# Patient Record
Sex: Female | Born: 1940 | Race: White | Hispanic: No | Smoking: Former smoker
Health system: Southern US, Community
[De-identification: ages and names within clinical notes are randomized; demographics above are authoritative.]

## PROBLEM LIST (undated history)

## (undated) DIAGNOSIS — C801 Malignant (primary) neoplasm, unspecified: Secondary | ICD-10-CM

## (undated) DIAGNOSIS — Z720 Tobacco use: Secondary | ICD-10-CM

## (undated) DIAGNOSIS — I739 Peripheral vascular disease, unspecified: Secondary | ICD-10-CM

## (undated) DIAGNOSIS — I5189 Other ill-defined heart diseases: Secondary | ICD-10-CM

## (undated) DIAGNOSIS — Z85828 Personal history of other malignant neoplasm of skin: Secondary | ICD-10-CM

## (undated) DIAGNOSIS — J449 Chronic obstructive pulmonary disease, unspecified: Secondary | ICD-10-CM

## (undated) DIAGNOSIS — E785 Hyperlipidemia, unspecified: Secondary | ICD-10-CM

## (undated) DIAGNOSIS — F419 Anxiety disorder, unspecified: Secondary | ICD-10-CM

## (undated) DIAGNOSIS — I2510 Atherosclerotic heart disease of native coronary artery without angina pectoris: Secondary | ICD-10-CM

## (undated) DIAGNOSIS — I10 Essential (primary) hypertension: Secondary | ICD-10-CM

## (undated) DIAGNOSIS — I714 Abdominal aortic aneurysm, without rupture, unspecified: Secondary | ICD-10-CM

## (undated) DIAGNOSIS — Z8589 Personal history of malignant neoplasm of other organs and systems: Secondary | ICD-10-CM

## (undated) HISTORY — DX: Other ill-defined heart diseases: I51.89

## (undated) HISTORY — PX: BREAST EXCISIONAL BIOPSY: SUR124

## (undated) HISTORY — DX: Hyperlipidemia, unspecified: E78.5

## (undated) HISTORY — DX: Tobacco use: Z72.0

## (undated) HISTORY — DX: Peripheral vascular disease, unspecified: I73.9

## (undated) HISTORY — DX: Personal history of other malignant neoplasm of skin: Z85.828

## (undated) HISTORY — PX: APPENDECTOMY: SHX54

## (undated) HISTORY — DX: Atherosclerotic heart disease of native coronary artery without angina pectoris: I25.10

## (undated) HISTORY — PX: CARDIAC CATHETERIZATION: SHX172

## (undated) HISTORY — PX: ABDOMINAL HYSTERECTOMY: SHX81

---
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
## 2011-02-22 ENCOUNTER — Ambulatory Visit: Payer: Self-pay | Admitting: Family Medicine

## 2012-02-27 ENCOUNTER — Ambulatory Visit: Payer: Self-pay | Admitting: Family Medicine

## 2013-09-18 ENCOUNTER — Ambulatory Visit: Payer: Self-pay | Admitting: Family Medicine

## 2014-04-28 ENCOUNTER — Emergency Department: Payer: Self-pay | Admitting: Emergency Medicine

## 2014-04-28 LAB — CBC
HCT: 42.5 % (ref 35.0–47.0)
HGB: 14 g/dL (ref 12.0–16.0)
MCH: 31.6 pg (ref 26.0–34.0)
MCHC: 32.8 g/dL (ref 32.0–36.0)
MCV: 96 fL (ref 80–100)
PLATELETS: 245 10*3/uL (ref 150–440)
RBC: 4.42 10*6/uL (ref 3.80–5.20)
RDW: 12.8 % (ref 11.5–14.5)
WBC: 6.9 10*3/uL (ref 3.6–11.0)

## 2014-04-28 LAB — COMPREHENSIVE METABOLIC PANEL
ALBUMIN: 4.1 g/dL (ref 3.4–5.0)
ALK PHOS: 101 U/L
ANION GAP: 9 (ref 7–16)
AST: 20 U/L (ref 15–37)
BUN: 12 mg/dL (ref 7–18)
Bilirubin,Total: 0.3 mg/dL (ref 0.2–1.0)
CALCIUM: 9.2 mg/dL (ref 8.5–10.1)
Chloride: 100 mmol/L (ref 98–107)
Co2: 30 mmol/L (ref 21–32)
Creatinine: 0.64 mg/dL (ref 0.60–1.30)
EGFR (African American): >60
Glucose: 94 mg/dL (ref 65–99)
OSMOLALITY: 277 (ref 275–301)
POTASSIUM: 3.5 mmol/L (ref 3.5–5.1)
SGPT (ALT): 25 U/L
Sodium: 139 mmol/L (ref 136–145)
Total Protein: 7.9 g/dL (ref 6.4–8.2)

## 2014-04-28 LAB — URINALYSIS, COMPLETE
BACTERIA: NONE SEEN
BILIRUBIN, UR: NEGATIVE
Glucose,UR: NEGATIVE mg/dL (ref 0–75)
Ketone: NEGATIVE
LEUKOCYTE ESTERASE: NEGATIVE
Nitrite: NEGATIVE
Ph: 7 (ref 4.5–8.0)
Protein: NEGATIVE
SQUAMOUS EPITHELIAL: NONE SEEN
Specific Gravity: 1.002 (ref 1.003–1.030)
WBC UR: NONE SEEN /HPF (ref 0–5)

## 2014-07-21 DIAGNOSIS — I714 Abdominal aortic aneurysm, without rupture: Secondary | ICD-10-CM | POA: Diagnosis not present

## 2014-07-21 DIAGNOSIS — I70213 Atherosclerosis of native arteries of extremities with intermittent claudication, bilateral legs: Secondary | ICD-10-CM | POA: Diagnosis not present

## 2014-07-21 DIAGNOSIS — I70211 Atherosclerosis of native arteries of extremities with intermittent claudication, right leg: Secondary | ICD-10-CM | POA: Diagnosis not present

## 2014-07-21 DIAGNOSIS — I77819 Aortic ectasia, unspecified site: Secondary | ICD-10-CM | POA: Diagnosis not present

## 2014-07-21 DIAGNOSIS — F17200 Nicotine dependence, unspecified, uncomplicated: Secondary | ICD-10-CM | POA: Diagnosis not present

## 2014-10-19 DIAGNOSIS — J449 Chronic obstructive pulmonary disease, unspecified: Secondary | ICD-10-CM | POA: Diagnosis not present

## 2014-10-19 DIAGNOSIS — J209 Acute bronchitis, unspecified: Secondary | ICD-10-CM | POA: Diagnosis not present

## 2014-10-19 DIAGNOSIS — F419 Anxiety disorder, unspecified: Secondary | ICD-10-CM | POA: Diagnosis not present

## 2014-10-19 DIAGNOSIS — E780 Pure hypercholesterolemia: Secondary | ICD-10-CM | POA: Diagnosis not present

## 2014-11-05 ENCOUNTER — Ambulatory Visit
Admit: 2014-11-05 | Disposition: A | Discharge status: Home / Self Care | Payer: Self-pay | Attending: Family Medicine | Admitting: Family Medicine

## 2014-11-05 DIAGNOSIS — Z1231 Encounter for screening mammogram for malignant neoplasm of breast: Secondary | ICD-10-CM | POA: Diagnosis not present

## 2014-11-13 DIAGNOSIS — I700 Atherosclerosis of aorta: Secondary | ICD-10-CM | POA: Diagnosis not present

## 2014-11-13 DIAGNOSIS — I70219 Atherosclerosis of native arteries of extremities with intermittent claudication, unspecified extremity: Secondary | ICD-10-CM | POA: Diagnosis not present

## 2014-11-13 DIAGNOSIS — I70213 Atherosclerosis of native arteries of extremities with intermittent claudication, bilateral legs: Secondary | ICD-10-CM | POA: Diagnosis not present

## 2014-11-13 DIAGNOSIS — I739 Peripheral vascular disease, unspecified: Secondary | ICD-10-CM | POA: Diagnosis not present

## 2014-11-13 DIAGNOSIS — I70211 Atherosclerosis of native arteries of extremities with intermittent claudication, right leg: Secondary | ICD-10-CM | POA: Diagnosis not present

## 2014-11-13 DIAGNOSIS — I77819 Aortic ectasia, unspecified site: Secondary | ICD-10-CM | POA: Diagnosis not present

## 2014-11-13 DIAGNOSIS — I714 Abdominal aortic aneurysm, without rupture: Secondary | ICD-10-CM | POA: Diagnosis not present

## 2014-11-13 DIAGNOSIS — F17200 Nicotine dependence, unspecified, uncomplicated: Secondary | ICD-10-CM | POA: Diagnosis not present

## 2014-12-30 DIAGNOSIS — M5136 Other intervertebral disc degeneration, lumbar region: Secondary | ICD-10-CM | POA: Diagnosis not present

## 2014-12-30 DIAGNOSIS — J449 Chronic obstructive pulmonary disease, unspecified: Secondary | ICD-10-CM | POA: Diagnosis not present

## 2014-12-30 DIAGNOSIS — M545 Low back pain: Secondary | ICD-10-CM | POA: Diagnosis not present

## 2014-12-30 DIAGNOSIS — F17200 Nicotine dependence, unspecified, uncomplicated: Secondary | ICD-10-CM | POA: Diagnosis not present

## 2014-12-30 DIAGNOSIS — E559 Vitamin D deficiency, unspecified: Secondary | ICD-10-CM | POA: Diagnosis not present

## 2014-12-30 DIAGNOSIS — Z716 Tobacco abuse counseling: Secondary | ICD-10-CM | POA: Diagnosis not present

## 2014-12-30 DIAGNOSIS — R262 Difficulty in walking, not elsewhere classified: Secondary | ICD-10-CM | POA: Diagnosis not present

## 2014-12-30 DIAGNOSIS — R5383 Other fatigue: Secondary | ICD-10-CM | POA: Diagnosis not present

## 2014-12-30 DIAGNOSIS — R64 Cachexia: Secondary | ICD-10-CM | POA: Diagnosis not present

## 2014-12-30 DIAGNOSIS — M25551 Pain in right hip: Secondary | ICD-10-CM | POA: Diagnosis not present

## 2014-12-30 DIAGNOSIS — E780 Pure hypercholesterolemia: Secondary | ICD-10-CM | POA: Diagnosis not present

## 2014-12-31 DIAGNOSIS — M129 Arthropathy, unspecified: Secondary | ICD-10-CM | POA: Diagnosis not present

## 2014-12-31 DIAGNOSIS — F419 Anxiety disorder, unspecified: Secondary | ICD-10-CM | POA: Diagnosis not present

## 2014-12-31 DIAGNOSIS — M25551 Pain in right hip: Secondary | ICD-10-CM | POA: Diagnosis not present

## 2014-12-31 DIAGNOSIS — J449 Chronic obstructive pulmonary disease, unspecified: Secondary | ICD-10-CM | POA: Diagnosis not present

## 2015-03-18 DIAGNOSIS — R05 Cough: Secondary | ICD-10-CM | POA: Diagnosis not present

## 2015-03-18 DIAGNOSIS — R509 Fever, unspecified: Secondary | ICD-10-CM | POA: Diagnosis not present

## 2015-03-18 DIAGNOSIS — R062 Wheezing: Secondary | ICD-10-CM | POA: Diagnosis not present

## 2015-03-18 DIAGNOSIS — J209 Acute bronchitis, unspecified: Secondary | ICD-10-CM | POA: Diagnosis not present

## 2015-05-18 DIAGNOSIS — I700 Atherosclerosis of aorta: Secondary | ICD-10-CM | POA: Diagnosis not present

## 2015-05-18 DIAGNOSIS — I70211 Atherosclerosis of native arteries of extremities with intermittent claudication, right leg: Secondary | ICD-10-CM | POA: Diagnosis not present

## 2015-05-18 DIAGNOSIS — I70219 Atherosclerosis of native arteries of extremities with intermittent claudication, unspecified extremity: Secondary | ICD-10-CM | POA: Diagnosis not present

## 2015-05-18 DIAGNOSIS — I714 Abdominal aortic aneurysm, without rupture: Secondary | ICD-10-CM | POA: Diagnosis not present

## 2015-05-18 DIAGNOSIS — I77819 Aortic ectasia, unspecified site: Secondary | ICD-10-CM | POA: Diagnosis not present

## 2015-05-18 DIAGNOSIS — F17200 Nicotine dependence, unspecified, uncomplicated: Secondary | ICD-10-CM | POA: Diagnosis not present

## 2015-05-18 DIAGNOSIS — I70213 Atherosclerosis of native arteries of extremities with intermittent claudication, bilateral legs: Secondary | ICD-10-CM | POA: Diagnosis not present

## 2015-05-18 DIAGNOSIS — I739 Peripheral vascular disease, unspecified: Secondary | ICD-10-CM | POA: Diagnosis not present

## 2015-06-12 DIAGNOSIS — Z23 Encounter for immunization: Secondary | ICD-10-CM | POA: Diagnosis not present

## 2015-08-18 DIAGNOSIS — R636 Underweight: Secondary | ICD-10-CM | POA: Diagnosis not present

## 2015-08-18 DIAGNOSIS — Z0001 Encounter for general adult medical examination with abnormal findings: Secondary | ICD-10-CM | POA: Diagnosis not present

## 2015-08-18 DIAGNOSIS — R7301 Impaired fasting glucose: Secondary | ICD-10-CM | POA: Diagnosis not present

## 2015-08-18 DIAGNOSIS — R05 Cough: Secondary | ICD-10-CM | POA: Diagnosis not present

## 2015-08-18 DIAGNOSIS — J069 Acute upper respiratory infection, unspecified: Secondary | ICD-10-CM | POA: Diagnosis not present

## 2015-08-18 DIAGNOSIS — R062 Wheezing: Secondary | ICD-10-CM | POA: Diagnosis not present

## 2015-08-18 DIAGNOSIS — E559 Vitamin D deficiency, unspecified: Secondary | ICD-10-CM | POA: Diagnosis not present

## 2015-08-18 DIAGNOSIS — F17210 Nicotine dependence, cigarettes, uncomplicated: Secondary | ICD-10-CM | POA: Diagnosis not present

## 2015-08-18 DIAGNOSIS — E782 Mixed hyperlipidemia: Secondary | ICD-10-CM | POA: Diagnosis not present

## 2015-09-06 DIAGNOSIS — E559 Vitamin D deficiency, unspecified: Secondary | ICD-10-CM | POA: Diagnosis not present

## 2015-09-06 DIAGNOSIS — R05 Cough: Secondary | ICD-10-CM | POA: Diagnosis not present

## 2015-09-06 DIAGNOSIS — F17210 Nicotine dependence, cigarettes, uncomplicated: Secondary | ICD-10-CM | POA: Diagnosis not present

## 2015-09-06 DIAGNOSIS — J393 Upper respiratory tract hypersensitivity reaction, site unspecified: Secondary | ICD-10-CM | POA: Diagnosis not present

## 2015-09-06 DIAGNOSIS — J309 Allergic rhinitis, unspecified: Secondary | ICD-10-CM | POA: Diagnosis not present

## 2015-11-25 ENCOUNTER — Observation Stay
Admission: EM | Admit: 2015-11-25 | Discharge: 2015-11-26 | Disposition: A | Discharge status: Home / Self Care | Payer: No Typology Code available for payment source | Attending: Internal Medicine | Admitting: Internal Medicine

## 2015-11-25 ENCOUNTER — Emergency Department: Payer: No Typology Code available for payment source

## 2015-11-25 ENCOUNTER — Encounter: Payer: Self-pay | Admitting: Emergency Medicine

## 2015-11-25 DIAGNOSIS — R7989 Other specified abnormal findings of blood chemistry: Secondary | ICD-10-CM

## 2015-11-25 DIAGNOSIS — R079 Chest pain, unspecified: Principal | ICD-10-CM | POA: Diagnosis present

## 2015-11-25 DIAGNOSIS — S1980XA Other specified injuries of unspecified part of neck, initial encounter: Secondary | ICD-10-CM | POA: Diagnosis not present

## 2015-11-25 DIAGNOSIS — F17200 Nicotine dependence, unspecified, uncomplicated: Secondary | ICD-10-CM | POA: Diagnosis not present

## 2015-11-25 DIAGNOSIS — M79632 Pain in left forearm: Secondary | ICD-10-CM | POA: Insufficient documentation

## 2015-11-25 DIAGNOSIS — S299XXA Unspecified injury of thorax, initial encounter: Secondary | ICD-10-CM | POA: Diagnosis not present

## 2015-11-25 DIAGNOSIS — M542 Cervicalgia: Secondary | ICD-10-CM | POA: Diagnosis not present

## 2015-11-25 DIAGNOSIS — R0789 Other chest pain: Secondary | ICD-10-CM | POA: Diagnosis not present

## 2015-11-25 DIAGNOSIS — Y92410 Unspecified street and highway as the place of occurrence of the external cause: Secondary | ICD-10-CM | POA: Insufficient documentation

## 2015-11-25 DIAGNOSIS — M25532 Pain in left wrist: Secondary | ICD-10-CM | POA: Insufficient documentation

## 2015-11-25 DIAGNOSIS — R748 Abnormal levels of other serum enzymes: Secondary | ICD-10-CM | POA: Insufficient documentation

## 2015-11-25 DIAGNOSIS — S59912A Unspecified injury of left forearm, initial encounter: Secondary | ICD-10-CM | POA: Diagnosis not present

## 2015-11-25 DIAGNOSIS — Z720 Tobacco use: Secondary | ICD-10-CM | POA: Diagnosis not present

## 2015-11-25 DIAGNOSIS — Z716 Tobacco abuse counseling: Secondary | ICD-10-CM | POA: Diagnosis not present

## 2015-11-25 DIAGNOSIS — M79602 Pain in left arm: Secondary | ICD-10-CM | POA: Diagnosis not present

## 2015-11-25 DIAGNOSIS — R778 Other specified abnormalities of plasma proteins: Secondary | ICD-10-CM

## 2015-11-25 DIAGNOSIS — I714 Abdominal aortic aneurysm, without rupture: Secondary | ICD-10-CM | POA: Diagnosis not present

## 2015-11-25 DIAGNOSIS — S20219A Contusion of unspecified front wall of thorax, initial encounter: Secondary | ICD-10-CM | POA: Diagnosis not present

## 2015-11-25 HISTORY — DX: Abdominal aortic aneurysm, without rupture: I71.4

## 2015-11-25 HISTORY — DX: Abdominal aortic aneurysm, without rupture, unspecified: I71.40

## 2015-11-25 LAB — CBC WITH DIFFERENTIAL/PLATELET
Basophils Absolute: 0 10*3/uL (ref 0–0.1)
EOS ABS: 0 10*3/uL (ref 0–0.7)
Eosinophils Relative: 0 %
HCT: 43.5 % (ref 35.0–47.0)
Hemoglobin: 14.7 g/dL (ref 12.0–16.0)
LYMPHS ABS: 1.2 10*3/uL (ref 1.0–3.6)
Lymphocytes Relative: 17 %
MCH: 30.5 pg (ref 26.0–34.0)
MCHC: 33.8 g/dL (ref 32.0–36.0)
MCV: 90.2 fL (ref 80.0–100.0)
Monocytes Absolute: 0.5 10*3/uL (ref 0.2–0.9)
Neutro Abs: 5.3 10*3/uL (ref 1.4–6.5)
Neutrophils Relative %: 74 %
PLATELETS: 249 10*3/uL (ref 150–440)
RBC: 4.82 MIL/uL (ref 3.80–5.20)
RDW: 14.1 % (ref 11.5–14.5)
WBC: 7.1 10*3/uL (ref 3.6–11.0)

## 2015-11-25 LAB — BASIC METABOLIC PANEL
Anion gap: 9 (ref 5–15)
BUN: 15 mg/dL (ref 6–20)
CALCIUM: 9.9 mg/dL (ref 8.9–10.3)
CO2: 31 mmol/L (ref 22–32)
CREATININE: 0.67 mg/dL (ref 0.44–1.00)
Chloride: 97 mmol/L — ABNORMAL LOW (ref 101–111)
GFR calc non Af Amer: >60 mL/min (ref >60)
Glucose, Bld: 107 mg/dL — ABNORMAL HIGH (ref 65–99)
Potassium: 4.5 mmol/L (ref 3.5–5.1)
SODIUM: 137 mmol/L (ref 135–145)

## 2015-11-25 LAB — TROPONIN I
TROPONIN I: 0.12 ng/mL — AB (ref <0.031)
Troponin I: 0.07 ng/mL — ABNORMAL HIGH (ref <0.031)
Troponin I: 0.09 ng/mL — ABNORMAL HIGH (ref <0.031)

## 2015-11-25 MED ORDER — ASPIRIN 81 MG PO CHEW
324.0000 mg | CHEWABLE_TABLET | Freq: Once | ORAL | Status: AC
Start: 2015-11-25 — End: 2015-11-25
  Administered 2015-11-25: 324 mg via ORAL
  Filled 2015-11-25: qty 4

## 2015-11-25 MED ORDER — NITROGLYCERIN 0.4 MG SL SUBL: 0.4000 mg | SUBLINGUAL_TABLET | SUBLINGUAL | Status: DC | PRN

## 2015-11-25 MED ORDER — MORPHINE SULFATE (PF) 2 MG/ML IV SOLN: 2.0000 mg | INTRAVENOUS | Status: DC | PRN

## 2015-11-25 MED ORDER — ATORVASTATIN CALCIUM 20 MG PO TABS: 20.0000 mg | ORAL_TABLET | Freq: Every day | ORAL | Status: DC

## 2015-11-25 MED ORDER — IOPAMIDOL (ISOVUE-300) INJECTION 61%
60.0000 mL | Freq: Once | INTRAVENOUS | Status: AC | PRN
  Administered 2015-11-25: 60 mL via INTRAVENOUS

## 2015-11-25 MED ORDER — ACETAMINOPHEN 650 MG RE SUPP: 650.0000 mg | Freq: Four times a day (QID) | RECTAL | Status: DC | PRN

## 2015-11-25 MED ORDER — ACETAMINOPHEN 325 MG PO TABS
ORAL_TABLET | ORAL | Status: AC
Start: 2015-11-25 — End: 2015-11-25
  Administered 2015-11-25: 650 mg
  Filled 2015-11-25: qty 2

## 2015-11-25 MED ORDER — ONDANSETRON HCL 4 MG PO TABS: 4.0000 mg | ORAL_TABLET | Freq: Four times a day (QID) | ORAL | Status: DC | PRN

## 2015-11-25 MED ORDER — OXYCODONE HCL 5 MG PO TABS: 5.0000 mg | ORAL_TABLET | ORAL | Status: DC | PRN

## 2015-11-25 MED ORDER — ONDANSETRON HCL 4 MG/2ML IJ SOLN: 4.0000 mg | Freq: Four times a day (QID) | INTRAMUSCULAR | Status: DC | PRN

## 2015-11-25 MED ORDER — ENOXAPARIN SODIUM 30 MG/0.3ML ~~LOC~~ SOLN
30.0000 mg | SUBCUTANEOUS | Status: DC
  Administered 2015-11-25: 30 mg via SUBCUTANEOUS
  Filled 2015-11-25: qty 0.3

## 2015-11-25 MED ORDER — ACETAMINOPHEN 325 MG PO TABS
650.0000 mg | ORAL_TABLET | Freq: Four times a day (QID) | ORAL | Status: DC | PRN
  Administered 2015-11-25 – 2015-11-26 (×2): 650 mg via ORAL
  Filled 2015-11-25 (×2): qty 2

## 2015-11-25 MED ORDER — ALBUTEROL SULFATE (2.5 MG/3ML) 0.083% IN NEBU: 3.0000 mL | INHALATION_SOLUTION | Freq: Four times a day (QID) | RESPIRATORY_TRACT | Status: DC | PRN

## 2015-11-25 MED ORDER — OXYCODONE-ACETAMINOPHEN 5-325 MG PO TABS
1.0000 | ORAL_TABLET | Freq: Once | ORAL | Status: AC
  Administered 2015-11-25: 1 via ORAL
  Filled 2015-11-25: qty 1

## 2015-11-25 MED ORDER — ASPIRIN EC 81 MG PO TBEC: 81.0000 mg | DELAYED_RELEASE_TABLET | Freq: Every day | ORAL | Status: DC

## 2015-11-25 NOTE — ED Notes (Signed)
c collar placed on pt
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
## 2015-11-25 NOTE — ED Notes (Signed)
Pt up to bathroom with no difficulty noted.

## 2015-11-25 NOTE — ED Notes (Signed)
Reported elevated troponin to Dr Reita Cliche. , troponin 0.07

## 2015-11-25 NOTE — ED Notes (Signed)
Patient transported to X-ray. Pt cleared from ccollar.
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
## 2015-11-26 ENCOUNTER — Ambulatory Visit: Payer: Medicare Other

## 2015-11-26 DIAGNOSIS — Z716 Tobacco abuse counseling: Secondary | ICD-10-CM | POA: Diagnosis not present

## 2015-11-26 DIAGNOSIS — R079 Chest pain, unspecified: Secondary | ICD-10-CM | POA: Diagnosis not present

## 2015-11-26 DIAGNOSIS — R0789 Other chest pain: Secondary | ICD-10-CM | POA: Diagnosis not present

## 2015-11-26 DIAGNOSIS — I714 Abdominal aortic aneurysm, without rupture: Secondary | ICD-10-CM | POA: Diagnosis not present

## 2015-11-26 DIAGNOSIS — Z720 Tobacco use: Secondary | ICD-10-CM | POA: Diagnosis not present

## 2015-11-26 DIAGNOSIS — R748 Abnormal levels of other serum enzymes: Secondary | ICD-10-CM | POA: Diagnosis not present

## 2015-11-26 LAB — PROTIME-INR
INR: 1.04
Prothrombin Time: 13.8 seconds (ref 11.4–15.0)

## 2015-11-26 LAB — CBC
HEMATOCRIT: 36.4 % (ref 35.0–47.0)
Hemoglobin: 12.6 g/dL (ref 12.0–16.0)
MCH: 31.2 pg (ref 26.0–34.0)
MCHC: 34.6 g/dL (ref 32.0–36.0)
MCV: 90.1 fL (ref 80.0–100.0)
Platelets: 208 10*3/uL (ref 150–440)
RBC: 4.04 MIL/uL (ref 3.80–5.20)
RDW: 13.5 % (ref 11.5–14.5)
WBC: 5 10*3/uL (ref 3.6–11.0)

## 2015-11-26 LAB — LIPID PANEL
CHOL/HDL RATIO: 2.6 ratio
CHOLESTEROL: 219 mg/dL — AB (ref 0–200)
HDL: 85 mg/dL (ref >40)
LDL Cholesterol: 128 mg/dL — ABNORMAL HIGH (ref 0–99)
Triglycerides: 31 mg/dL (ref <150)
VLDL: 6 mg/dL (ref 0–40)

## 2015-11-26 LAB — COMPREHENSIVE METABOLIC PANEL
ALBUMIN: 3.7 g/dL (ref 3.5–5.0)
ALT: 24 U/L (ref 14–54)
AST: 29 U/L (ref 15–41)
Alkaline Phosphatase: 66 U/L (ref 38–126)
Anion gap: 6 (ref 5–15)
BUN: 25 mg/dL — AB (ref 6–20)
CO2: 29 mmol/L (ref 22–32)
Calcium: 9 mg/dL (ref 8.9–10.3)
Chloride: 102 mmol/L (ref 101–111)
Creatinine, Ser: 0.67 mg/dL (ref 0.44–1.00)
GFR calc Af Amer: >60 mL/min (ref >60)
GFR calc non Af Amer: >60 mL/min (ref >60)
GLUCOSE: 100 mg/dL — AB (ref 65–99)
POTASSIUM: 4.1 mmol/L (ref 3.5–5.1)
Sodium: 137 mmol/L (ref 135–145)
Total Bilirubin: 0.5 mg/dL (ref 0.3–1.2)
Total Protein: 6.3 g/dL — ABNORMAL LOW (ref 6.5–8.1)

## 2015-11-26 LAB — TSH: TSH: 3.054 u[IU]/mL (ref 0.350–4.500)

## 2015-11-26 LAB — TROPONIN I: Troponin I: 0.05 ng/mL — ABNORMAL HIGH (ref <0.031)

## 2015-11-26 NOTE — Progress Notes (Signed)
OK for discharge. Full note to follow
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
## 2015-11-26 NOTE — Progress Notes (Signed)
Discharge instructions explained to pt and pts family/ verbalized an understanding/ iv and tele removed/ will transport off unit via wheelchair  

## 2015-11-26 NOTE — Progress Notes (Signed)
A&O. Independent. Medicated for chest pain. On room air. Tele  And skin verified with Yasmin.

## 2015-11-26 NOTE — Progress Notes (Signed)
   11/26/15 1120  Clinical Encounter Type  Visited With Patient and family together  Visit Type Initial  Referral From Nurse  Consult/Referral To Chaplain  Spiritual Encounters  Spiritual Needs Literature  Stress Factors  Patient Stress Factors Health changes  Advance Directives (For Healthcare)  Does patient have an advance directive? No  Would patient like information on creating an advanced directive? Yes - Educational materials given  Does patient want to make changes to advanced directive? No - Patient declined  Provided patient & family w/AD education and forms. Patient plans to review at home and proceed there if desired. Explained how to proceed with notary & witnesses. Chap. Marcelline Temkin G. [place]
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
## 2015-11-30 ENCOUNTER — Emergency Department
Admission: EM | Admit: 2015-11-30 | Discharge: 2015-11-30 | Disposition: A | Discharge status: Home / Self Care | Payer: Medicare Other | Attending: Emergency Medicine | Admitting: Emergency Medicine

## 2015-11-30 DIAGNOSIS — M62838 Other muscle spasm: Secondary | ICD-10-CM | POA: Insufficient documentation

## 2015-11-30 DIAGNOSIS — S20219A Contusion of unspecified front wall of thorax, initial encounter: Secondary | ICD-10-CM

## 2015-11-30 DIAGNOSIS — Y92410 Unspecified street and highway as the place of occurrence of the external cause: Secondary | ICD-10-CM | POA: Insufficient documentation

## 2015-11-30 DIAGNOSIS — S161XXA Strain of muscle, fascia and tendon at neck level, initial encounter: Secondary | ICD-10-CM | POA: Insufficient documentation

## 2015-11-30 DIAGNOSIS — Y939 Activity, unspecified: Secondary | ICD-10-CM | POA: Insufficient documentation

## 2015-11-30 DIAGNOSIS — Y999 Unspecified external cause status: Secondary | ICD-10-CM | POA: Diagnosis not present

## 2015-11-30 DIAGNOSIS — F17200 Nicotine dependence, unspecified, uncomplicated: Secondary | ICD-10-CM | POA: Insufficient documentation

## 2015-11-30 DIAGNOSIS — S199XXA Unspecified injury of neck, initial encounter: Secondary | ICD-10-CM | POA: Diagnosis present

## 2015-11-30 MED ORDER — CYCLOBENZAPRINE HCL 5 MG PO TABS: 5.0000 mg | ORAL_TABLET | Freq: Three times a day (TID) | ORAL | Status: DC | PRN

## 2015-11-30 MED ORDER — MELOXICAM 7.5 MG PO TABS: 15.0000 mg | ORAL_TABLET | Freq: Every day | ORAL | Status: DC

## 2015-11-30 NOTE — Discharge Instructions (Signed)
Cervical Strain and Sprain With Rehab Cervical strain and sprain are injuries that commonly occur with "whiplash" injuries. Whiplash occurs when the neck is forcefully whipped backward or forward, such as during a motor vehicle accident or during contact sports. The muscles, ligaments, tendons, discs, and nerves of the neck are susceptible to injury when this occurs. RISK FACTORS Risk of having a whiplash injury increases if:  Osteoarthritis of the spine.  Situations that make head or neck accidents or trauma more likely.  High-risk sports (football, rugby, wrestling, hockey, auto racing, gymnastics, diving, contact karate, or boxing).  Poor strength and flexibility of the neck.  Previous neck injury.  Poor tackling technique.  Improperly fitted or padded equipment. SYMPTOMS   Pain or stiffness in the front or back of neck or both.  Symptoms may present immediately or up to 24 hours after injury.  Dizziness, headache, nausea, and vomiting.  Muscle spasm with soreness and stiffness in the neck.  Tenderness and swelling at the injury site. PREVENTION  Learn and use proper technique (avoid tackling with the head, spearing, and head-butting; use proper falling techniques to avoid landing on the head).  Warm up and stretch properly before activity.  Maintain physical fitness:  Strength, flexibility, and endurance.  Cardiovascular fitness.  Wear properly fitted and padded protective equipment, such as padded soft collars, for participation in contact sports. PROGNOSIS  Recovery from cervical strain and sprain injuries is dependent on the extent of the injury. These injuries are usually curable in 1 week to 3 months with appropriate treatment.  RELATED COMPLICATIONS   Temporary numbness and weakness may occur if the nerve roots are damaged, and this may persist until the nerve has completely healed.  Chronic pain due to frequent recurrence of symptoms.  Prolonged healing,  especially if activity is resumed too soon (before complete recovery). TREATMENT  Treatment initially involves the use of ice and medication to help reduce pain and inflammation. It is also important to perform strengthening and stretching exercises and modify activities that worsen symptoms so the injury does not get worse. These exercises may be performed at home or with a therapist. For patients who experience severe symptoms, a soft, padded collar may be recommended to be worn around the neck.  Improving your posture may help reduce symptoms. Posture improvement includes pulling your chin and abdomen in while sitting or standing. If you are sitting, sit in a firm chair with your buttocks against the back of the chair. While sleeping, try replacing your pillow with a small towel rolled to 2 inches in diameter, or use a cervical pillow or soft cervical collar. Poor sleeping positions delay healing.  For patients with nerve root damage, which causes numbness or weakness, the use of a cervical traction apparatus may be recommended. Surgery is rarely necessary for these injuries. However, cervical strain and sprains that are present at birth (congenital) may require surgery. MEDICATION   If pain medication is necessary, nonsteroidal anti-inflammatory medications, such as aspirin and ibuprofen, or other minor pain relievers, such as acetaminophen, are often recommended.  Do not take pain medication for 7 days before surgery.  Prescription pain relievers may be given if deemed necessary by your caregiver. Use only as directed and only as much as you need. HEAT AND COLD:   Cold treatment (icing) relieves pain and reduces inflammation. Cold treatment should be applied for 10 to 15 minutes every 2 to 3 hours for inflammation and pain and immediately after any activity that aggravates your  HEAT AND COLD:   · Cold treatment (icing) relieves pain and reduces inflammation. Cold treatment should be applied for 10 to 15 minutes every 2 to 3 hours for inflammation and pain and immediately after any activity that aggravates your symptoms. Use ice packs or an ice massage.  · Heat treatment may be used prior to performing the stretching and  strengthening activities prescribed by your caregiver, physical therapist, or athletic trainer. Use a heat pack or a warm soak.  SEEK MEDICAL CARE IF:   · Symptoms get worse or do not improve in 2 weeks despite treatment.  · New, unexplained symptoms develop (drugs used in treatment may produce side effects).  EXERCISES  RANGE OF MOTION (ROM) AND STRETCHING EXERCISES - Cervical Strain and Sprain  These exercises may help you when beginning to rehabilitate your injury. In order to successfully resolve your symptoms, you must improve your posture. These exercises are designed to help reduce the forward-head and rounded-shoulder posture which contributes to this condition. Your symptoms may resolve with or without further involvement from your physician, physical therapist or athletic trainer. While completing these exercises, remember:   · Restoring tissue flexibility helps normal motion to return to the joints. This allows healthier, less painful movement and activity.  · An effective stretch should be held for at least 20 seconds, although you may need to begin with shorter hold times for comfort.  · A stretch should never be painful. You should only feel a gentle lengthening or release in the stretched tissue.  STRETCH- Axial Extensors  · Lie on your back on the floor. You may bend your knees for comfort. Place a rolled-up hand towel or dish towel, about 2 inches in diameter, under the part of your head that makes contact with the floor.  · Gently tuck your chin, as if trying to make a "double chin," until you feel a gentle stretch at the base of your head.  · Hold __________ seconds.  Repeat __________ times. Complete this exercise __________ times per day.   STRETCH - Axial Extension   · Stand or sit on a firm surface. Assume a good posture: chest up, shoulders drawn back, abdominal muscles slightly tense, knees unlocked (if standing) and feet hip width apart.  · Slowly retract your chin so your head slides back  and your chin slightly lowers. Continue to look straight ahead.  · You should feel a gentle stretch in the back of your head. Be certain not to feel an aggressive stretch since this can cause headaches later.  · Hold for __________ seconds.  Repeat __________ times. Complete this exercise __________ times per day.  STRETCH - Cervical Side Bend   · Stand or sit on a firm surface. Assume a good posture: chest up, shoulders drawn back, abdominal muscles slightly tense, knees unlocked (if standing) and feet hip width apart.  · Without letting your nose or shoulders move, slowly tip your right / left ear to your shoulder until your feel a gentle stretch in the muscles on the opposite side of your neck.  · Hold __________ seconds.  Repeat __________ times. Complete this exercise __________ times per day.  STRETCH - Cervical Rotators   · Stand or sit on a firm surface. Assume a good posture: chest up, shoulders drawn back, abdominal muscles slightly tense, knees unlocked (if standing) and feet hip width apart.  · Keeping your eyes level with the ground, slowly turn your head until you feel a gentle stretch along   the back and opposite side of your neck.  · Hold __________ seconds.  Repeat __________ times. Complete this exercise __________ times per day.  RANGE OF MOTION - Neck Circles   · Stand or sit on a firm surface. Assume a good posture: chest up, shoulders drawn back, abdominal muscles slightly tense, knees unlocked (if standing) and feet hip width apart.  · Gently roll your head down and around from the back of one shoulder to the back of the other. The motion should never be forced or painful.  · Repeat the motion 10-20 times, or until you feel the neck muscles relax and loosen.  Repeat __________ times. Complete the exercise __________ times per day.  STRENGTHENING EXERCISES - Cervical Strain and Sprain  These exercises may help you when beginning to rehabilitate your injury. They may resolve your symptoms with or  without further involvement from your physician, physical therapist, or athletic trainer. While completing these exercises, remember:   · Muscles can gain both the endurance and the strength needed for everyday activities through controlled exercises.  · Complete these exercises as instructed by your physician, physical therapist, or athletic trainer. Progress the resistance and repetitions only as guided.  · You may experience muscle soreness or fatigue, but the pain or discomfort you are trying to eliminate should never worsen during these exercises. If this pain does worsen, stop and make certain you are following the directions exactly. If the pain is still present after adjustments, discontinue the exercise until you can discuss the trouble with your clinician.  STRENGTH - Cervical Flexors, Isometric  · Face a wall, standing about 6 inches away. Place a small pillow, a ball about 6-8 inches in diameter, or a folded towel between your forehead and the wall.  · Slightly tuck your chin and gently push your forehead into the soft object. Push only with mild to moderate intensity, building up tension gradually. Keep your jaw and forehead relaxed.  · Hold 10 to 20 seconds. Keep your breathing relaxed.  · Release the tension slowly. Relax your neck muscles completely before you start the next repetition.  Repeat __________ times. Complete this exercise __________ times per day.  STRENGTH- Cervical Lateral Flexors, Isometric   · Stand about 6 inches away from a wall. Place a small pillow, a ball about 6-8 inches in diameter, or a folded towel between the side of your head and the wall.  · Slightly tuck your chin and gently tilt your head into the soft object. Push only with mild to moderate intensity, building up tension gradually. Keep your jaw and forehead relaxed.  · Hold 10 to 20 seconds. Keep your breathing relaxed.  · Release the tension slowly. Relax your neck muscles completely before you start the next  repetition.  Repeat __________ times. Complete this exercise __________ times per day.  STRENGTH - Cervical Extensors, Isometric   · Stand about 6 inches away from a wall. Place a small pillow, a ball about 6-8 inches in diameter, or a folded towel between the back of your head and the wall.  · Slightly tuck your chin and gently tilt your head back into the soft object. Push only with mild to moderate intensity, building up tension gradually. Keep your jaw and forehead relaxed.  · Hold 10 to 20 seconds. Keep your breathing relaxed.  · Release the tension slowly. Relax your neck muscles completely before you start the next repetition.  Repeat __________ times. Complete this exercise __________ times per day.    All of your joints have less wear and tear when properly supported by a spine with good posture. This means you will experience a healthier, less painful body.  Correct posture must be practiced with all of your activities, especially prolonged sitting and standing. Correct posture is as important when doing repetitive low-stress activities (typing) as it is when doing a single heavy-load activity (lifting). PROLONGED STANDING WHILE SLIGHTLY LEANING FORWARD When completing a task that requires you to lean forward while standing in one  place for a long time, place either foot up on a stationary 2- to 4-inch high object to help maintain the best posture. When both feet are on the ground, the low back tends to lose its slight inward curve. If this curve flattens (or becomes too large), then the back and your other joints will experience too much stress, fatigue more quickly, and can cause pain.  RESTING POSITIONS Consider which positions are most painful for you when choosing a resting position. If you have pain with flexion-based activities (sitting, bending, stooping, squatting), choose a position that allows you to rest in a less flexed posture. You would want to avoid curling into a fetal position on your side. If your pain worsens with extension-based activities (prolonged standing, working overhead), avoid resting in an extended position such as sleeping on your stomach. Most people will find more comfort when they rest with their spine in a more neutral position, neither too rounded nor too arched. Lying on a non-sagging bed on your side with a pillow between your knees, or on your back with a pillow under your knees will often provide some relief. Keep in mind, being in any one position for a prolonged period of time, no matter how correct your posture, can still lead to stiffness. WALKING Walk with an upright posture. Your ears, shoulders, and hips should all line up. OFFICE WORK When working at a desk, create an environment that supports good, upright posture. Without extra support, muscles fatigue and lead to excessive strain on joints and other tissues. CHAIR:  A chair should be able to slide under your desk when your back makes contact with the back of the chair. This allows you to work closely.  The chair's height should allow your eyes to be level with the upper part of your monitor and your hands to be slightly lower than your elbows.  Body position:  Your feet should make contact with the floor. If this is not  possible, use a foot rest.  Keep your ears over your shoulders. This will reduce stress on your neck and low back.   This information is not intended to replace advice given to you by your health care provider. Make sure you discuss any questions you have with your health care provider.   Document Released: 07/03/2005 Document Revised: 07/24/2014 Document Reviewed: 10/15/2008 Elsevier Interactive Patient Education 2016 [place].  Blunt Chest Trauma Blunt chest trauma is an injury caused by a blow to the chest. These chest injuries can be very painful. Blunt chest trauma often results in bruised or broken (fractured) ribs. Most cases of bruised and fractured ribs from blunt chest traumas get better after 1 to 3 weeks of rest and pain medicine. Often, the soft tissue in the chest wall is also injured, causing pain and bruising. Internal organs, such as the heart and lungs, may also be injured. Blunt chest trauma can lead to serious medical problems. This injury requires immediate medical care. CAUSES   Motor  vehicle collisions.  Falls.  Physical violence.  Sports injuries. SYMPTOMS   Chest pain. The pain may be worse when you move or breathe deeply.  Shortness of breath.  Lightheadedness.  Bruising.  Tenderness.  Swelling. DIAGNOSIS  Your caregiver will do a physical exam. X-rays may be taken to look for fractures. However, minor rib fractures may not show up on X-rays until a few days after the injury. If a more serious injury is suspected, further imaging tests may be done. This may include ultrasounds, computed tomography (CT) scans, or magnetic resonance imaging (MRI). TREATMENT  Treatment depends on the severity of your injury. Your caregiver may prescribe pain medicines and deep breathing exercises. HOME CARE INSTRUCTIONS  Limit your activities until you can move around without much pain.  Do not do any strenuous work until your injury is healed.  Put ice on the  injured area.  Put ice in a plastic bag.  Place a towel between your skin and the bag.  Leave the ice on for 15-20 minutes, 03-04 times a day.  You may wear a rib belt as directed by your caregiver to reduce pain.  Practice deep breathing as directed by your caregiver to keep your lungs clear.  Only take over-the-counter or prescription medicines for pain, fever, or discomfort as directed by your caregiver. SEEK IMMEDIATE MEDICAL CARE IF:   You have increasing pain or shortness of breath.  You cough up blood.  You have nausea, vomiting, or abdominal pain.  You have a fever.  You feel dizzy, weak, or you faint. MAKE SURE YOU:  Understand these instructions.  Will watch your condition.  Will get help right away if you are not doing well or get worse.   This information is not intended to replace advice given to you by your health care provider. Make sure you discuss any questions you have with your health care provider.   Document Released: 08/10/2004 Document Revised: 07/24/2014 Document Reviewed: 12/30/2014 Elsevier Interactive Patient Education 2016 [employer]  Cryotherapy Cryotherapy is when you put ice on your injury. Ice helps lessen pain and puffiness (swelling) after an injury. Ice works the best when you start using it in the first 24 to 48 hours after an injury. HOME CARE  Put a dry or damp towel between the ice pack and your skin.  You may press gently on the ice pack.  Leave the ice on for no more than 10 to 20 minutes at a time.  Check your skin after 5 minutes to make sure your skin is okay.  Rest at least 20 minutes between ice pack uses.  Stop using ice when your skin loses feeling (numbness).  Do not use ice on someone who cannot tell you when it hurts. This includes small children and people with memory problems (dementia). GET HELP RIGHT AWAY IF:  You have white spots on your skin.  Your skin turns blue or pale.  Your skin feels waxy or  hard.  Your puffiness gets worse. MAKE SURE YOU:   Understand these instructions.  Will watch your condition.  Will get help right away if you are not doing well or get worse.   This information is not intended to replace advice given to you by your health care provider. Make sure you discuss any questions you have with your health care provider.   Document Released: 12/20/2007 Document Revised: 09/25/2011 Document Reviewed: 02/23/2011 Elsevier Interactive Patient Education 2016 [place] therapy can  help ease sore, stiff, injured, and tight muscles and joints. Heat relaxes your muscles, which may help ease your pain. Heat therapy should only be used on old, pre-existing, or long-lasting (chronic) injuries. Do not use heat therapy unless told by your doctor. HOW TO USE HEAT THERAPY There are several different kinds of heat therapy, including:  Moist heat pack.  Warm water bath.  Hot water bottle.  Electric heating pad.  Heated gel pack.  Heated wrap.  Electric heating pad. GENERAL HEAT THERAPY RECOMMENDATIONS   Do not sleep while using heat therapy. Only use heat therapy while you are awake.  Your skin may turn pink while using heat therapy. Do not use heat therapy if your skin turns red.  Do not use heat therapy if you have new pain.  High heat or long exposure to heat can cause burns. Be careful when using heat therapy to avoid burning your skin.  Do not use heat therapy on areas of your skin that are already irritated, such as with a rash or sunburn. GET HELP IF:   You have blisters, redness, swelling (puffiness), or numbness.  You have new pain.  Your pain is worse. MAKE SURE YOU:  Understand these instructions.  Will watch your condition.  Will get help right away if you are not doing well or get worse.   This information is not intended to replace advice given to you by your health care provider. Make sure you discuss any  questions you have with your health care provider.   Document Released: 09/25/2011 Document Revised: 07/24/2014 Document Reviewed: 08/26/2013 Elsevier Interactive Patient Education 2016 [employer]  Muscle Cramps and Spasms Muscle cramps and spasms occur when a muscle or muscles tighten and you have no control over this tightening (involuntary muscle contraction). They are a common problem and can develop in any muscle. The most common place is in the calf muscles of the leg. Both muscle cramps and muscle spasms are involuntary muscle contractions, but they also have differences:   Muscle cramps are sporadic and painful. They may last a few seconds to a quarter of an hour. Muscle cramps are often more forceful and last longer than muscle spasms.  Muscle spasms may or may not be painful. They may also last just a few seconds or much longer. CAUSES  It is uncommon for cramps or spasms to be due to a serious underlying problem. In many cases, the cause of cramps or spasms is unknown. Some common causes are:   Overexertion.   Overuse from repetitive motions (doing the same thing over and over).   Remaining in a certain position for a long period of time.   Improper preparation, form, or technique while performing a sport or activity.   Dehydration.   Injury.   Side effects of some medicines.   Abnormally low levels of the salts and ions in your blood (electrolytes), especially potassium and calcium. This could happen if you are taking water pills (diuretics) or you are pregnant.  Some underlying medical problems can make it more likely to develop cramps or spasms. These include, but are not limited to:   Diabetes.   Parkinson disease.   Hormone disorders, such as thyroid problems.   Alcohol abuse.   Diseases specific to muscles, joints, and bones.   Blood vessel disease where not enough blood is getting to the muscles.  HOME CARE INSTRUCTIONS   Stay well  hydrated. Drink enough water and fluids to keep your urine clear  or pale yellow.  It may be helpful to massage, stretch, and relax the affected muscle.  For tight or tense muscles, use a warm towel, heating pad, or hot shower water directed to the affected area.  If you are sore or have pain after a cramp or spasm, applying ice to the affected area may relieve discomfort.  Put ice in a plastic bag.  Place a towel between your skin and the bag.  Leave the ice on for 15-20 minutes, 03-04 times a day.  Medicines used to treat a known cause of cramps or spasms may help reduce their frequency or severity. Only take over-the-counter or prescription medicines as directed by your caregiver. SEEK MEDICAL CARE IF:  Your cramps or spasms get more severe, more frequent, or do not improve over time.  MAKE SURE YOU:   Understand these instructions.  Will watch your condition.  Will get help right away if you are not doing well or get worse.   This information is not intended to replace advice given to you by your health care provider. Make sure you discuss any questions you have with your health care provider.   Document Released: 12/23/2001 Document Revised: 10/28/2012 Document Reviewed: 06/19/2012 Elsevier Interactive Patient Education 2016 [employer].  Technical brewer After a car crash (motor vehicle collision), it is normal to have bruises and sore muscles. The first 24 hours usually feel the worst. After that, you will likely start to feel better each day. HOME CARE  Put ice on the injured area.  Put ice in a plastic bag.  Place a towel between your skin and the bag.  Leave the ice on for 15-20 minutes, 03-04 times a day.  Drink enough fluids to keep your pee (urine) clear or pale yellow.  Do not drink alcohol.  Take a warm shower or bath 1 or 2 times a day. This helps your sore muscles.  Return to activities as told by your doctor. Be careful when lifting. Lifting  can make neck or back pain worse.  Only take medicine as told by your doctor. Do not use aspirin. GET HELP RIGHT AWAY IF:   Your arms or legs tingle, feel weak, or lose feeling (numbness).  You have headaches that do not get better with medicine.  You have neck pain, especially in the middle of the back of your neck.  You cannot control when you pee (urinate) or poop (bowel movement).  Pain is getting worse in any part of your body.  You are short of breath, dizzy, or pass out (faint).  You have chest pain.  You feel sick to your stomach (nauseous), throw up (vomit), or sweat.  You have belly (abdominal) pain that gets worse.  There is blood in your pee, poop, or throw up.  You have pain in your shoulder (shoulder strap areas).  Your problems are getting worse. MAKE SURE YOU:   Understand these instructions.  Will watch your condition.  Will get help right away if you are not doing well or get worse.   This information is not intended to replace advice given to you by your health care provider. Make sure you discuss any questions you have with your health care provider.   Document Released: 12/20/2007 Document Revised: 09/25/2011 Document Reviewed: 11/30/2010 Elsevier Interactive Patient Education [employer].
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
## 2015-12-07 DIAGNOSIS — M5030 Other cervical disc degeneration, unspecified cervical region: Secondary | ICD-10-CM | POA: Diagnosis not present

## 2015-12-07 DIAGNOSIS — M47812 Spondylosis without myelopathy or radiculopathy, cervical region: Secondary | ICD-10-CM | POA: Diagnosis not present

## 2015-12-07 DIAGNOSIS — S161XXA Strain of muscle, fascia and tendon at neck level, initial encounter: Secondary | ICD-10-CM | POA: Diagnosis not present

## 2015-12-17 DIAGNOSIS — R05 Cough: Secondary | ICD-10-CM | POA: Diagnosis not present

## 2015-12-17 DIAGNOSIS — F17210 Nicotine dependence, cigarettes, uncomplicated: Secondary | ICD-10-CM | POA: Diagnosis not present

## 2015-12-17 DIAGNOSIS — R062 Wheezing: Secondary | ICD-10-CM | POA: Diagnosis not present

## 2015-12-17 DIAGNOSIS — J069 Acute upper respiratory infection, unspecified: Secondary | ICD-10-CM | POA: Diagnosis not present

## 2015-12-28 DIAGNOSIS — M542 Cervicalgia: Secondary | ICD-10-CM | POA: Diagnosis not present

## 2015-12-29 DIAGNOSIS — R0602 Shortness of breath: Secondary | ICD-10-CM | POA: Diagnosis not present

## 2016-01-04 DIAGNOSIS — M542 Cervicalgia: Secondary | ICD-10-CM | POA: Diagnosis not present

## 2016-01-06 DIAGNOSIS — M542 Cervicalgia: Secondary | ICD-10-CM | POA: Diagnosis not present

## 2016-01-11 DIAGNOSIS — M542 Cervicalgia: Secondary | ICD-10-CM | POA: Diagnosis not present

## 2016-01-31 DIAGNOSIS — J449 Chronic obstructive pulmonary disease, unspecified: Secondary | ICD-10-CM | POA: Diagnosis not present

## 2016-01-31 DIAGNOSIS — M542 Cervicalgia: Secondary | ICD-10-CM | POA: Diagnosis not present

## 2016-01-31 DIAGNOSIS — R0602 Shortness of breath: Secondary | ICD-10-CM | POA: Diagnosis not present

## 2016-01-31 DIAGNOSIS — R05 Cough: Secondary | ICD-10-CM | POA: Diagnosis not present

## 2016-04-14 DIAGNOSIS — F17210 Nicotine dependence, cigarettes, uncomplicated: Secondary | ICD-10-CM | POA: Diagnosis not present

## 2016-04-14 DIAGNOSIS — R05 Cough: Secondary | ICD-10-CM | POA: Diagnosis not present

## 2016-04-14 DIAGNOSIS — R062 Wheezing: Secondary | ICD-10-CM | POA: Diagnosis not present

## 2016-04-14 DIAGNOSIS — J069 Acute upper respiratory infection, unspecified: Secondary | ICD-10-CM | POA: Diagnosis not present

## 2016-04-22 DIAGNOSIS — J029 Acute pharyngitis, unspecified: Secondary | ICD-10-CM | POA: Diagnosis not present

## 2016-04-22 DIAGNOSIS — R05 Cough: Secondary | ICD-10-CM | POA: Diagnosis not present

## 2016-04-27 ENCOUNTER — Emergency Department: Payer: Medicare Other

## 2016-04-27 ENCOUNTER — Inpatient Hospital Stay
Admission: EM | Admit: 2016-04-27 | Discharge: 2016-04-29 | DRG: 193 | Disposition: A | Discharge status: Home / Self Care | Payer: Medicare Other | Attending: Internal Medicine | Admitting: Internal Medicine

## 2016-04-27 DIAGNOSIS — J9601 Acute respiratory failure with hypoxia: Secondary | ICD-10-CM | POA: Diagnosis not present

## 2016-04-27 DIAGNOSIS — F17200 Nicotine dependence, unspecified, uncomplicated: Secondary | ICD-10-CM | POA: Diagnosis not present

## 2016-04-27 DIAGNOSIS — E871 Hypo-osmolality and hyponatremia: Secondary | ICD-10-CM | POA: Diagnosis not present

## 2016-04-27 DIAGNOSIS — Z681 Body mass index (BMI) 19 or less, adult: Secondary | ICD-10-CM | POA: Diagnosis not present

## 2016-04-27 DIAGNOSIS — F17210 Nicotine dependence, cigarettes, uncomplicated: Secondary | ICD-10-CM | POA: Diagnosis not present

## 2016-04-27 DIAGNOSIS — R0602 Shortness of breath: Secondary | ICD-10-CM | POA: Diagnosis not present

## 2016-04-27 DIAGNOSIS — E43 Unspecified severe protein-calorie malnutrition: Secondary | ICD-10-CM | POA: Diagnosis not present

## 2016-04-27 DIAGNOSIS — J439 Emphysema, unspecified: Secondary | ICD-10-CM | POA: Diagnosis not present

## 2016-04-27 DIAGNOSIS — J441 Chronic obstructive pulmonary disease with (acute) exacerbation: Secondary | ICD-10-CM | POA: Diagnosis present

## 2016-04-27 DIAGNOSIS — R059 Cough, unspecified: Secondary | ICD-10-CM

## 2016-04-27 DIAGNOSIS — R262 Difficulty in walking, not elsewhere classified: Secondary | ICD-10-CM

## 2016-04-27 DIAGNOSIS — R05 Cough: Secondary | ICD-10-CM | POA: Diagnosis not present

## 2016-04-27 DIAGNOSIS — Z882 Allergy status to sulfonamides status: Secondary | ICD-10-CM

## 2016-04-27 DIAGNOSIS — Z7952 Long term (current) use of systemic steroids: Secondary | ICD-10-CM

## 2016-04-27 DIAGNOSIS — E876 Hypokalemia: Secondary | ICD-10-CM | POA: Diagnosis present

## 2016-04-27 DIAGNOSIS — Z886 Allergy status to analgesic agent status: Secondary | ICD-10-CM

## 2016-04-27 DIAGNOSIS — Z79899 Other long term (current) drug therapy: Secondary | ICD-10-CM

## 2016-04-27 DIAGNOSIS — J189 Pneumonia, unspecified organism: Secondary | ICD-10-CM | POA: Diagnosis not present

## 2016-04-27 LAB — COMPREHENSIVE METABOLIC PANEL
ALBUMIN: 4 g/dL (ref 3.5–5.0)
ALK PHOS: 74 U/L (ref 38–126)
ALT: 25 U/L (ref 14–54)
ANION GAP: 8 (ref 5–15)
AST: 26 U/L (ref 15–41)
BILIRUBIN TOTAL: 0.8 mg/dL (ref 0.3–1.2)
BUN: 18 mg/dL (ref 6–20)
CALCIUM: 9.1 mg/dL (ref 8.9–10.3)
CO2: 29 mmol/L (ref 22–32)
Chloride: 94 mmol/L — ABNORMAL LOW (ref 101–111)
Creatinine, Ser: 0.62 mg/dL (ref 0.44–1.00)
GFR calc Af Amer: >60 mL/min (ref >60)
GLUCOSE: 90 mg/dL (ref 65–99)
POTASSIUM: 3.3 mmol/L — AB (ref 3.5–5.1)
Sodium: 131 mmol/L — ABNORMAL LOW (ref 135–145)
TOTAL PROTEIN: 7.1 g/dL (ref 6.5–8.1)

## 2016-04-27 LAB — CBC WITH DIFFERENTIAL/PLATELET
BASOS PCT: 0 %
Basophils Absolute: 0 10*3/uL (ref 0–0.1)
Eosinophils Absolute: 0 10*3/uL (ref 0–0.7)
Eosinophils Relative: 0 %
HEMATOCRIT: 39.3 % (ref 35.0–47.0)
HEMOGLOBIN: 13.6 g/dL (ref 12.0–16.0)
Lymphocytes Relative: 9 %
Lymphs Abs: 1.5 10*3/uL (ref 1.0–3.6)
MCH: 31.6 pg (ref 26.0–34.0)
MCHC: 34.6 g/dL (ref 32.0–36.0)
MCV: 91.2 fL (ref 80.0–100.0)
MONOS PCT: 7 %
Monocytes Absolute: 1.2 10*3/uL — ABNORMAL HIGH (ref 0.2–0.9)
NEUTROS ABS: 14.4 10*3/uL — AB (ref 1.4–6.5)
NEUTROS PCT: 84 %
Platelets: 287 10*3/uL (ref 150–440)
RBC: 4.31 MIL/uL (ref 3.80–5.20)
RDW: 12.9 % (ref 11.5–14.5)
WBC: 17.1 10*3/uL — ABNORMAL HIGH (ref 3.6–11.0)

## 2016-04-27 LAB — MAGNESIUM: Magnesium: 1.7 mg/dL (ref 1.7–2.4)

## 2016-04-27 LAB — TROPONIN I: Troponin I: <0.03 ng/mL (ref <0.03)

## 2016-04-27 LAB — LACTIC ACID, PLASMA: Lactic Acid, Venous: 1.2 mmol/L (ref 0.5–1.9)

## 2016-04-27 LAB — LIPASE, BLOOD: LIPASE: 29 U/L (ref 11–51)

## 2016-04-27 MED ORDER — IPRATROPIUM-ALBUTEROL 0.5-2.5 (3) MG/3ML IN SOLN
3.0000 mL | Freq: Once | RESPIRATORY_TRACT | Status: AC
  Administered 2016-04-27: 3 mL via RESPIRATORY_TRACT
  Filled 2016-04-27: qty 3

## 2016-04-27 MED ORDER — ACETAMINOPHEN 500 MG PO TABS
1000.0000 mg | ORAL_TABLET | Freq: Once | ORAL | Status: AC
  Administered 2016-04-27: 1000 mg via ORAL
  Filled 2016-04-27: qty 2

## 2016-04-27 MED ORDER — CEFEPIME-DEXTROSE 1 GM/50ML IV SOLR
1.0000 g | Freq: Once | INTRAVENOUS | Status: AC
  Administered 2016-04-27: 1 g via INTRAVENOUS
  Filled 2016-04-27: qty 50

## 2016-04-27 MED ORDER — VANCOMYCIN HCL IN DEXTROSE 750-5 MG/150ML-% IV SOLN
750.0000 mg | Freq: Once | INTRAVENOUS | Status: AC
  Administered 2016-04-28: 750 mg via INTRAVENOUS
  Filled 2016-04-27: qty 150

## 2016-04-27 MED ORDER — GI COCKTAIL ~~LOC~~
30.0000 mL | Freq: Once | ORAL | Status: AC
  Administered 2016-04-27: 30 mL via ORAL

## 2016-04-27 MED ORDER — GI COCKTAIL ~~LOC~~
ORAL | Status: AC
  Administered 2016-04-27: 30 mL via ORAL
  Filled 2016-04-27: qty 30

## 2016-04-27 MED ORDER — ALBUTEROL SULFATE (2.5 MG/3ML) 0.083% IN NEBU
5.0000 mg | INHALATION_SOLUTION | Freq: Once | RESPIRATORY_TRACT | Status: AC
  Administered 2016-04-27: 5 mg via RESPIRATORY_TRACT
  Filled 2016-04-27: qty 6

## 2016-04-27 NOTE — Progress Notes (Addendum)
  Pharmacy Antibiotic Note  Christina Hester is a 75 y.o. female admitted on 04/27/2016 with pneumonia.  Pharmacy has been consulted for vancomycin and cefepime dosing.  Plan: 1. Cefepime 1 gm IV Q24H 2. Vancomycin 750 mg IV x 1 in ED followed in approximately 18 hours (stacked dosing) by vancomycin 750 mg IV Q24H. Predicted trough 20 mcg/mL (although low body weight may underestimate renal function). Pharmacy will continue to follow and adjust as needed to maintain trough 15 to 20 mcg/mL.   Vd 27.3 L, Ke 0.036 hr-1, T1/2 19.3 hr   Height: 5' (152.4 cm) Weight: 86 lb (39 kg) IBW/kg (Calculated) : 45.5  Temp (24hrs), Avg:98.4 F (36.9 C), Min:98.4 F (36.9 C), Max:98.4 F (36.9 C)   Recent Labs Lab 04/27/16 2211  WBC 17.1*  CREATININE 0.62    Estimated Creatinine Clearance: 38 mL/min (by C-G formula based on SCr of 0.62 mg/dL).    Allergies  Allergen Reactions  . Aspirin Tinitus  . Sulfa Antibiotics Nausea And Vomiting    Thank you for allowing pharmacy to be a part of this patient's care.  Laural Benes, Pharm.D., BCPS Clinical Pharmacist 04/27/2016 11:07 PM
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
## 2016-04-27 NOTE — ED Triage Notes (Addendum)
Pt in with co cough for over a week saw pmd and was put augmentin for bronchitis. States saw urgent care last week for consistent symptoms for prednisone which she finished today. States here for persistent cough and chills. Also co shob has hx of emphysema. States does have productive whitish sputum.

## 2016-04-28 ENCOUNTER — Encounter: Payer: Self-pay | Admitting: *Deleted

## 2016-04-28 DIAGNOSIS — Z886 Allergy status to analgesic agent status: Secondary | ICD-10-CM | POA: Diagnosis not present

## 2016-04-28 DIAGNOSIS — Z79899 Other long term (current) drug therapy: Secondary | ICD-10-CM | POA: Diagnosis not present

## 2016-04-28 DIAGNOSIS — Z681 Body mass index (BMI) 19 or less, adult: Secondary | ICD-10-CM | POA: Diagnosis not present

## 2016-04-28 DIAGNOSIS — E871 Hypo-osmolality and hyponatremia: Secondary | ICD-10-CM | POA: Diagnosis not present

## 2016-04-28 DIAGNOSIS — Z882 Allergy status to sulfonamides status: Secondary | ICD-10-CM | POA: Diagnosis not present

## 2016-04-28 DIAGNOSIS — Z7952 Long term (current) use of systemic steroids: Secondary | ICD-10-CM | POA: Diagnosis not present

## 2016-04-28 DIAGNOSIS — E43 Unspecified severe protein-calorie malnutrition: Secondary | ICD-10-CM | POA: Diagnosis present

## 2016-04-28 DIAGNOSIS — F17200 Nicotine dependence, unspecified, uncomplicated: Secondary | ICD-10-CM | POA: Diagnosis present

## 2016-04-28 DIAGNOSIS — R05 Cough: Secondary | ICD-10-CM | POA: Diagnosis present

## 2016-04-28 DIAGNOSIS — J441 Chronic obstructive pulmonary disease with (acute) exacerbation: Secondary | ICD-10-CM | POA: Diagnosis not present

## 2016-04-28 DIAGNOSIS — J189 Pneumonia, unspecified organism: Secondary | ICD-10-CM | POA: Diagnosis present

## 2016-04-28 DIAGNOSIS — E876 Hypokalemia: Secondary | ICD-10-CM | POA: Diagnosis not present

## 2016-04-28 LAB — BASIC METABOLIC PANEL
Anion gap: 7 (ref 5–15)
BUN: 19 mg/dL (ref 6–20)
CO2: 28 mmol/L (ref 22–32)
CREATININE: 0.55 mg/dL (ref 0.44–1.00)
Calcium: 8.5 mg/dL — ABNORMAL LOW (ref 8.9–10.3)
Chloride: 98 mmol/L — ABNORMAL LOW (ref 101–111)
GFR calc Af Amer: >60 mL/min (ref >60)
GLUCOSE: 129 mg/dL — AB (ref 65–99)
Potassium: 3.2 mmol/L — ABNORMAL LOW (ref 3.5–5.1)
SODIUM: 133 mmol/L — AB (ref 135–145)

## 2016-04-28 LAB — CBC
HCT: 35 % (ref 35.0–47.0)
Hemoglobin: 12.2 g/dL (ref 12.0–16.0)
MCH: 31.7 pg (ref 26.0–34.0)
MCHC: 34.9 g/dL (ref 32.0–36.0)
MCV: 90.7 fL (ref 80.0–100.0)
PLATELETS: 262 10*3/uL (ref 150–440)
RBC: 3.86 MIL/uL (ref 3.80–5.20)
RDW: 12.9 % (ref 11.5–14.5)
WBC: 18.1 10*3/uL — AB (ref 3.6–11.0)

## 2016-04-28 LAB — PROCALCITONIN: Procalcitonin: 0.27 ng/mL

## 2016-04-28 LAB — EXPECTORATED SPUTUM ASSESSMENT W GRAM STAIN, RFLX TO RESP C

## 2016-04-28 LAB — EXPECTORATED SPUTUM ASSESSMENT W REFEX TO RESP CULTURE: SPECIAL REQUESTS: NORMAL

## 2016-04-28 LAB — INFLUENZA PANEL BY PCR (TYPE A & B)
H1N1FLUPCR: NOT DETECTED
Influenza A By PCR: NEGATIVE
Influenza B By PCR: NEGATIVE

## 2016-04-28 LAB — PHOSPHORUS: Phosphorus: 2.7 mg/dL (ref 2.5–4.6)

## 2016-04-28 LAB — MRSA PCR SCREENING: MRSA BY PCR: NEGATIVE

## 2016-04-28 MED ORDER — BISACODYL 5 MG PO TBEC: 5.0000 mg | DELAYED_RELEASE_TABLET | Freq: Every day | ORAL | Status: DC | PRN

## 2016-04-28 MED ORDER — VANCOMYCIN HCL 500 MG IV SOLR
500.0000 mg | INTRAVENOUS | Status: DC
  Filled 2016-04-28: qty 500

## 2016-04-28 MED ORDER — MAGNESIUM SULFATE 2 GM/50ML IV SOLN
2.0000 g | Freq: Once | INTRAVENOUS | Status: AC
Start: 2016-04-28 — End: 2016-04-28
  Administered 2016-04-28: 14:00:00 2 g via INTRAVENOUS
  Filled 2016-04-28: qty 50

## 2016-04-28 MED ORDER — ACETAMINOPHEN 650 MG RE SUPP: 650.0000 mg | Freq: Four times a day (QID) | RECTAL | Status: DC | PRN

## 2016-04-28 MED ORDER — HEPARIN SODIUM (PORCINE) 5000 UNIT/ML IJ SOLN
5000.0000 [IU] | Freq: Three times a day (TID) | INTRAMUSCULAR | Status: DC
  Filled 2016-04-28: qty 1

## 2016-04-28 MED ORDER — ACETAMINOPHEN 325 MG PO TABS: 650.0000 mg | ORAL_TABLET | Freq: Four times a day (QID) | ORAL | Status: DC | PRN

## 2016-04-28 MED ORDER — DEXTROSE 5 % IV SOLN
2.0000 g | INTRAVENOUS | Status: DC
  Filled 2016-04-28: qty 2

## 2016-04-28 MED ORDER — ENOXAPARIN SODIUM 40 MG/0.4ML ~~LOC~~ SOLN: 40.0000 mg | SUBCUTANEOUS | Status: DC

## 2016-04-28 MED ORDER — METHYLPREDNISOLONE SODIUM SUCC 125 MG IJ SOLR
60.0000 mg | Freq: Four times a day (QID) | INTRAMUSCULAR | Status: DC
  Administered 2016-04-28 – 2016-04-29 (×6): 60 mg via INTRAVENOUS
  Filled 2016-04-28 (×6): qty 2

## 2016-04-28 MED ORDER — CEFEPIME-DEXTROSE 1 GM/50ML IV SOLR
1.0000 g | Freq: Every day | INTRAVENOUS | Status: DC
  Administered 2016-04-28: 1 g via INTRAVENOUS
  Filled 2016-04-28: qty 50

## 2016-04-28 MED ORDER — HEPARIN SODIUM (PORCINE) 5000 UNIT/ML IJ SOLN: 5000.0000 [IU] | Freq: Two times a day (BID) | INTRAMUSCULAR | Status: DC

## 2016-04-28 MED ORDER — ZOLPIDEM TARTRATE 5 MG PO TABS: 5.0000 mg | ORAL_TABLET | Freq: Every evening | ORAL | Status: DC | PRN

## 2016-04-28 MED ORDER — ONDANSETRON HCL 4 MG/2ML IJ SOLN: 4.0000 mg | Freq: Four times a day (QID) | INTRAMUSCULAR | Status: DC | PRN

## 2016-04-28 MED ORDER — IPRATROPIUM-ALBUTEROL 0.5-2.5 (3) MG/3ML IN SOLN
3.0000 mL | Freq: Four times a day (QID) | RESPIRATORY_TRACT | Status: DC | PRN
Start: 2016-04-28 — End: 2016-04-29
  Administered 2016-04-28: 3 mL via RESPIRATORY_TRACT
  Filled 2016-04-28: qty 3

## 2016-04-28 MED ORDER — LORATADINE 10 MG PO TABS
10.0000 mg | ORAL_TABLET | Freq: Every day | ORAL | Status: DC
  Administered 2016-04-28 – 2016-04-29 (×2): 10 mg via ORAL
  Filled 2016-04-28 (×2): qty 1

## 2016-04-28 MED ORDER — SENNOSIDES-DOCUSATE SODIUM 8.6-50 MG PO TABS: 1.0000 | ORAL_TABLET | Freq: Every evening | ORAL | Status: DC | PRN

## 2016-04-28 MED ORDER — OMEGA-3-ACID ETHYL ESTERS 1 G PO CAPS
1.0000 g | ORAL_CAPSULE | Freq: Two times a day (BID) | ORAL | Status: DC
  Administered 2016-04-28 – 2016-04-29 (×3): 1 g via ORAL
  Filled 2016-04-28 (×3): qty 1

## 2016-04-28 MED ORDER — ONDANSETRON HCL 4 MG PO TABS: 4.0000 mg | ORAL_TABLET | Freq: Four times a day (QID) | ORAL | Status: DC | PRN

## 2016-04-28 MED ORDER — DEXTROSE 5 % IV SOLN
2.0000 g | Freq: Two times a day (BID) | INTRAVENOUS | Status: DC
  Filled 2016-04-28: qty 2

## 2016-04-28 MED ORDER — HYDROCODONE-ACETAMINOPHEN 5-325 MG PO TABS: 1.0000 | ORAL_TABLET | ORAL | Status: DC | PRN

## 2016-04-28 MED ORDER — VANCOMYCIN HCL IN DEXTROSE 750-5 MG/150ML-% IV SOLN
750.0000 mg | INTRAVENOUS | Status: DC
  Filled 2016-04-28: qty 150

## 2016-04-28 MED ORDER — POTASSIUM CHLORIDE CRYS ER 20 MEQ PO TBCR
40.0000 meq | EXTENDED_RELEASE_TABLET | Freq: Two times a day (BID) | ORAL | Status: DC
  Administered 2016-04-28 – 2016-04-29 (×3): 40 meq via ORAL
  Filled 2016-04-28 (×3): qty 2

## 2016-04-28 MED ORDER — ENSURE ENLIVE PO LIQD
237.0000 mL | ORAL | Status: DC
  Administered 2016-04-28: 14:00:00 237 mL via ORAL

## 2016-04-28 MED ORDER — SODIUM CHLORIDE 0.9 % IV SOLN
INTRAVENOUS | Status: DC
  Administered 2016-04-28: 03:00:00 via INTRAVENOUS

## 2016-04-28 MED ORDER — MAGNESIUM CITRATE PO SOLN: 1.0000 | Freq: Once | ORAL | Status: DC | PRN

## 2016-04-28 NOTE — H&P (Signed)
[place] @ [facility] Admission History and Physical Harvie Bridge, D.O.  ---------------------------------------------------------------------------------------------------------------------   PATIENT NAME: Christina Hester MR#: [id number] DATE OF BIRTH: Jun 06, 1941 DATE OF ADMISSION: 04/27/2016 PRIMARY CARE PHYSICIAN: Ronnell Freshwater, NP  REQUESTING/REFERRING PHYSICIAN: ED Dr. Quentin Cornwall  CHIEF COMPLAINT: Chief Complaint  Patient presents with  . Cough    HISTORY OF PRESENT ILLNESS: Christina Hester is a 75 y.o. female with a known history of COPD presents to the emergency department complaining of Cough and shortness of breath.  Patient was in a usual state of health until 2 weeks ago when she developed cough productive of clear to white sputum. She was seen by her primary care doctor who prescribed Augmentin for 10 days which she completed. She did not feel significant improvement following the course of antibiotics that she visited with her physician again who prescribed prednisone. She has been taking prednisone for the past 5 days and has not seen any significant improvement in fact she states that her cough is worsening as is her shortness of breath and dyspnea on exertion. Nebulizers are ineffective. She denies any fevers or chills, chest pain or palpitations..  Otherwise there has been no change in status. Patient has been taking medication as prescribed and there has been no recent change in medication or diet.  There has been no recent illness, travel.  She does have a sick contact-her grandson had a viral upper respiratory infection that was severe last weekend.   EMS/ED COURSE:   Patient received Maxipime and vancomycin. IV magnesium  PAST MEDICAL HISTORY: Past Medical History:  Diagnosis Date  . AAA (abdominal aortic aneurysm) ([place])   COPD    PAST SURGICAL HISTORY: Past Surgical History:  Procedure Laterality Date  . ABDOMINAL HYSTERECTOMY         SOCIAL HISTORY: Social History  Substance Use Topics  . Smoking status: Current Every Day Smoker  . Smokeless tobacco: Not on file  . Alcohol use No      FAMILY HISTORY: No family history on file.   MEDICATIONS AT HOME: Prior to Admission medications   Medication Sig Start Date End Date Taking? Authorizing Provider  albuterol (PROVENTIL HFA;VENTOLIN HFA) 108 (90 Base) MCG/ACT inhaler Inhale 2 puffs into the lungs every 6 (six) hours as needed for wheezing or shortness of breath.   Yes Historical Provider, MD  cetirizine (ZYRTEC) 10 MG tablet Take 1 tablet by mouth daily.   Yes Historical Provider, MD  Omega-3 Fatty Acids (FISH OIL PO) Take 1 capsule by mouth daily.   Yes Historical Provider, MD  predniSONE (DELTASONE) 10 MG tablet Take 1 tablet by mouth as directed. 04/22/16   Historical Provider, MD      DRUG ALLERGIES: Allergies  Allergen Reactions  . Aspirin Tinitus  . Sulfa Antibiotics Nausea And Vomiting     REVIEW OF SYSTEMS: CONSTITUTIONAL: No fatigue, weakness, fever, chills, weight gain/loss, headache EYES: No blurry or double vision. ENT: No tinnitus, postnasal drip, redness or soreness of the oropharynx. RESPIRATORY: Positive dyspnea, cough, wheeze, negative hemoptysis. CARDIOVASCULAR: No chest pain, orthopnea, palpitations, syncope. GASTROINTESTINAL: No nausea, vomiting, constipation, diarrhea, abdominal pain. No hematemesis, melena or hematochezia. GENITOURINARY: No dysuria, frequency, hematuria. ENDOCRINE: No polyuria or nocturia. No heat or cold intolerance. HEMATOLOGY: No anemia, bruising, bleeding. INTEGUMENTARY: No rashes, ulcers, lesions. MUSCULOSKELETAL: No pain, arthritis, swelling, gout. NEUROLOGIC: No numbness, tingling, weakness or ataxia. No seizure-type activity. PSYCHIATRIC: No anxiety, depression, insomnia.  PHYSICAL EXAMINATION: VITAL SIGNS: Blood pressure (!) 154/72, pulse 82,  temperature 98.4 F (36.9 C), temperature source Oral, resp.  rate (!) 41, height 5' (1.524 m), weight 39 kg (86 lb), SpO2 100 %.  GENERAL: 75 y.o.-year-old white female patient, well-developed, thin but well-nourished lying in the bed in no acute distress.  Pleasant and cooperative.   HEENT: Head atraumatic, normocephalic. Pupils equal, round, reactive to light and accommodation. No scleral icterus. Extraocular muscles intact.Mucus membranes moist. NECK: Supple, full range of motion. No JVD, no bruit heard. No cervical lymphadenopathy. CHEST: Diminished breath sounds throughout. Slight expiratory wheeze. No rales, rhonchi or crackles. No use of accessory muscles of respiration.  No reproducible chest wall tenderness.  Patient is speaking in full sentences, now satting at 96% on room air CARDIOVASCULAR: S1, S2 normal. No murmurs, rubs, or gallops appreciated. Cap refill <2 seconds. ABDOMEN: Soft, nontender, nondistended. No rebound, guarding, rigidity. Normoactive bowel sounds present in all four quadrants. No organomegaly or mass. EXTREMITIES: Full range of motion. No pedal edema, cyanosis, or clubbing. NEUROLOGIC: Cranial nerves II through XII are grossly intact with no focal sensorimotor deficit. Muscle strength 5/5 in all extremities. Sensation intact. Gait not checked. PSYCHIATRIC: The patient is alert and oriented x 3. Normal affect, mood, thought content. SKIN: Warm, dry, and intact without obvious rash, lesion, or ulcer.  LABORATORY PANEL:  CBC  Recent Labs Lab 04/27/16 2211  WBC 17.1*  HGB 13.6  HCT 39.3  PLT 287   ----------------------------------------------------------------------------------------------------------------- Chemistries  Recent Labs Lab 04/27/16 2211  NA 131*  K 3.3*  CL 94*  CO2 29  GLUCOSE 90  BUN 18  CREATININE 0.62  CALCIUM 9.1  MG 1.7  AST 26  ALT 25  ALKPHOS 74  BILITOT 0.8   ------------------------------------------------------------------------------------------------------------------ Cardiac  Enzymes  Recent Labs Lab 04/27/16 2211  TROPONINI <0.03   ------------------------------------------------------------------------------------------------------------------  RADIOLOGY: Dg Chest 2 View  Result Date: 04/27/2016 CLINICAL DATA:  Cough for 1 week. Acute shortness of breath. Concern for pneumonia. EXAM: CHEST  2 VIEW COMPARISON:  Radiographs and chest CT 11/25/2015 FINDINGS: Mild emphysema. Dependent opacity at the lung bases on the lateral view not well characterized on PA imaging. Normal heart size and mediastinal contours. No pulmonary edema, pleural effusion or pneumothorax. No acute osseous abnormality. IMPRESSION: Dependent basilar opacity may be atelectasis or pneumonia, only appreciated on the lateral view. Electronically Signed   By: Jeb Levering M.D.   On: 04/27/2016 22:36    EKG: Normal sinus rhythm at 80  bpm with normal axis and nonspecific ST-T wave changes.   IMPRESSION AND PLAN:  This is a 75 y.o. female with a history of  COPD, NOT home O2 dependent now being admitted with: 1. CAP failed outpatient therapy including Augmentin, prednisone and nebulizers  - Admit for IV abx, sputum culture. Leukocytosis may be secondary to recent steroids  2. COPD exacerbation - IV steroids, nebs, O2 3. Hypokalemia - replace PO, check Mg, Phos 4. Hyponatremia - IVS   Diet/Nutrition:  Heart healthy  Fluids:  IV normal saline DVT Px: Lovenox, SCDs and early ambulation Code Status: Full  All the records are reviewed and case discussed with ED provider. Management plans discussed with the patient and/or family who express understanding and agree with plan of care.   TOTAL TIME TAKING CARE OF THIS PATIENT: 60 minutes.   Alynna Hargrove D.O. on 04/28/2016 at 12:44 AM Between 7am to 6pm - Pager - [phone] After 6pm go to [email or web address] - Marketing executive [place] [facility] Office [phone] CC: Primary care physician;  Ronnell Freshwater,  NP     Note: This dictation was prepared with Dragon dictation along with smaller phrase technology. Any transcriptional errors that result from this process are unintentional.

## 2016-04-28 NOTE — Progress Notes (Addendum)
Pharmacy Antibiotic Note  Christina Hester is a 75 y.o. female admitted on 04/27/2016 with pneumonia failed outpatient.  Pharmacy has been consulted for cefepime and vancomycin dosing.  Plan: Will adjust dose of vancomycin 750 mg IV q24h to 500 mg IV q24h due to lower than ideal body weight. Pharmacy will continue to follow and adjust as needed to maintain trough 15 - 20 mcg/mL. Predicted Cmin at ss = 16.3 based on CrCl of 36.3 mL/min and total body weight of 37.3 kg  Due to 10 day of Augmentin outpatient and unresolving CAP symptoms, will increase cefepime 1 g IV q 24h dose to cefepime 2 g IV q 12h dose.  Addendum: Per rounds, due to MRSA PCR negative, will dc vancomycin. Per MD, pt originally felt better after Augmentin and then began to feel worse again. Testing for influenza. Not likely to be failed outpatient therapy as originally presumed. Possibly d/c cefepime based on PCT level. Baseline is 0.27, will f/u with 48 hr level.  Will change cefepime dose to 2 g IV q 24 hours. Will dc vancomycin   Height: 5' (152.4 cm) Weight: 79 lb 9.6 oz (36.1 kg) IBW/kg (Calculated) : 45.5  Temp (24hrs), Avg:97.9 F (36.6 C), Min:97.2 F (36.2 C), Max:98.4 F (36.9 C)   Recent Labs Lab 04/27/16 2211 04/28/16 0352  WBC 17.1* 18.1*  CREATININE 0.62 0.55  LATICACIDVEN 1.2  --     Estimated Creatinine Clearance: 35.2 mL/min (by C-G formula based on SCr of 0.55 mg/dL).    Allergies  Allergen Reactions  . Aspirin Tinitus  . Sulfa Antibiotics Nausea And Vomiting    Antimicrobials this admission: Vanc 10/13 >> 10/13 Cefepime 10/12 >>   Dose adjustments this admission: Decreased vanc dose, increased cefepime dose  Microbiology results: 10/13 Sputum: Pending  10/13 MRSA PCR: Negative  Thank you for allowing pharmacy to be a part of this patient's care.  Darrow Bussing, PharmD Pharmacy Resident 04/28/2016 11:06 AM

## 2016-04-28 NOTE — Evaluation (Signed)
Physical Therapy Evaluation Patient Details Name: Christina Hester MRN: [id number] DOB: August 04, 1940 Today's Date: 04/28/2016   History of Present Illness  Pt is a 75 y/o F who presented with cough and SOB.  She was treated by her PCP with augmentin and prednisone without resolve of symptoms. Pt's admitting dx: CAP along with COPD exacerbation.  Pt's PMH includes AAA.    Clinical Impression  Pt admitted with above diagnosis. Christina Hester is independent with all mobility, no instability noted with high level balance activities.  No PT needs identified.  PT will sign off.    Follow Up Recommendations No PT follow up    Equipment Recommendations  None recommended by PT    Recommendations for Other Services       Precautions / Restrictions Precautions Precautions: None Restrictions Weight Bearing Restrictions: No      Mobility  Bed Mobility Overal bed mobility: Independent             General bed mobility comments: No cues or physical assist needed  Transfers Overall transfer level: Independent Equipment used: None             General transfer comment: No cues or physical assist needed  Ambulation/Gait Ambulation/Gait assistance: Independent Ambulation Distance (Feet): 500 Feet Assistive device: None Gait Pattern/deviations: WFL(Within Functional Limits)   Gait velocity interpretation: at or above normal speed for age/gender General Gait Details: No abnormalities noted.  No instability, even with high level balance activities as documented below.  Stairs            Wheelchair Mobility    Modified Rankin (Stroke Patients Only)       Balance Overall balance assessment: Independent Sitting-balance support: No upper extremity supported;Feet supported Sitting balance-Leahy Scale: Normal     Standing balance support: No upper extremity supported;During functional activity Standing balance-Leahy Scale: Normal               High level balance  activites: Backward walking;Direction changes;Turns;Sudden stops;Head turns High Level Balance Comments: No instability noted with high level balance activities Standardized Balance Assessment Standardized Balance Assessment : Dynamic Gait Index   Dynamic Gait Index Level Surface: Normal Change in Gait Speed: Normal Gait with Horizontal Head Turns: Normal Gait with Vertical Head Turns: Normal Gait and Pivot Turn: Normal Step Over Obstacle: Normal Step Around Obstacles: Normal       Pertinent Vitals/Pain Pain Assessment: No/denies pain    Home Living Family/patient expects to be discharged to:: Private residence Living Arrangements: Alone Available Help at Discharge: Family;Available PRN/intermittently Type of Home: House Home Access: Stairs to enter Entrance Stairs-Rails: Left Entrance Stairs-Number of Steps: 4 Home Layout: One level Home Equipment: Environmental consultant - 4 wheels (rollator)      Prior Function Level of Independence: Independent         Comments: Takes care of her grandson while her daughter is at work during the day.  Does not use AD.  Independent PTA.     Hand Dominance   Dominant Hand: Right    Extremity/Trunk Assessment   Upper Extremity Assessment: Overall WFL for tasks assessed           Lower Extremity Assessment: Overall WFL for tasks assessed      Cervical / Trunk Assessment: Normal  Communication   Communication: No difficulties  Cognition Arousal/Alertness: Awake/alert Behavior During Therapy: WFL for tasks assessed/performed Overall Cognitive Status: Within Functional Limits for tasks assessed  General Comments General comments (skin integrity, edema, etc.): SpO2 remains at or above 92% on RA throughout session.    Exercises     Assessment/Plan    PT Assessment Patent does not need any further PT services  PT Problem List Other (comment) (none found)          PT Treatment Interventions      PT  Goals (Current goals can be found in the Care Plan section)  Acute Rehab PT Goals Patient Stated Goal: to go home PT Goal Formulation: All assessment and education complete, DC therapy    Frequency     Barriers to discharge        Co-evaluation               End of Session   Activity Tolerance: Patient tolerated treatment well Patient left: in bed;with call bell/phone within reach;with family/visitor present Nurse Communication: Mobility status;Other (comment) (SpO2)         Time: [id number] PT Time Calculation (min) (ACUTE ONLY): 13 min   Charges:   PT Evaluation $PT Eval Low Complexity: 1 Procedure     PT G Codes:        Collie Siad PT, DPT 04/28/2016, 4:00 PM

## 2016-04-28 NOTE — Care Management Important Message (Signed)
Important Message  Patient Details  Name: Christina Hester MRN: [id number] Date of Birth: February 24, 1941   Medicare Important Message Given:  Yes    Shelbie Ammons, RN 04/28/2016, 9:31 AM

## 2016-04-28 NOTE — Progress Notes (Signed)
While rounding, [place] made initial visit to room 103A. Pt was in good spirits though she is confronted with possible pneumonia. (pr Pt and chart) [place] informed the Pt that we are a part of their Health Care Team, and are available 24/7. Pt was appreciative of the visit but had no needs at this time.    04/28/16 1000  Clinical Encounter Type  Visited With Patient  Visit Type Initial;Spiritual support  Referral From Nurse  Spiritual Encounters  Spiritual Needs Emotional

## 2016-04-28 NOTE — Care Management (Signed)
Admitted to [facility] with the diagnosis of possible pneumonia. Lives alone. Daughter is Taliyah Bilderback ([id number]). 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 Takes care of 49 month old great-grandson from 8:00am 5:00pm. Seen at Urgent Care last Saturday. No home health. No skilled facility. No home oxygen. Seen Dr. Junius Creamer on September 30th. Uses no aids for ambulation. Rakes care of all basic and instrumental activities of daily living herself, drives. Prescriptions are filled at [facility]. Family will transport. Shelbie Ammons RN MSN CCM Care Management [phone]

## 2016-04-28 NOTE — Progress Notes (Addendum)
Initial Nutrition Assessment  DOCUMENTATION CODES:   Severe malnutrition in context of chronic illness  INTERVENTION:  -Encourage po intake and recommend liberalize diet to regular -Discussed well balanced diet focusing on good sources of protein and small frequent meals -Recommend Ensure Enlive po q daily, each supplement provides 350 kcal and 20 grams of protein    NUTRITION DIAGNOSIS:   Malnutrition related to chronic illness as evidenced by severe depletion of body fat, severe depletion of muscle mass.    GOAL:   Patient will meet greater than or equal to 90% of their needs    MONITOR:   PO intake, Supplement acceptance  REASON FOR ASSESSMENT:    (low BMI)    ASSESSMENT:     75 y.o female admitted with pneumonia, COPD exacerbation.  Pt with history of COPD, AAA  Pt reports good appetite during and prior to admission.  Ate 100% of salad, pork loin and sweet potato for lunch today.   Medications reviewed: omega 3 fatty acids, mag sulfate, senna docusate Labs reviewed: Na 133, K 3.2, glucose 129  Nutrition-Focused physical exam completed. Findings are moderate to severe  fat depletion, moderate to severe muscle depletion, and no edema.   Pt reports that she takes care of 18 month grandchild daily and if very independent.  Diet Order:  Diet Heart Room service appropriate? Yes; Fluid consistency: Thin  Skin:  Reviewed, no issues  Last BM:  10/12  Height: Pt reports stable wt for awhile now but UBW of 92 pounds but that has been awhile ago.    Ht Readings from Last 1 Encounters:  04/28/16 5' (1.524 m)    Weight:   Wt Readings from Last 1 Encounters:  04/28/16 79 lb 9.6 oz (36.1 kg)    Ideal Body Weight:     BMI:  Body mass index is 15.55 kg/m.  Estimated Nutritional Needs:   Kcal:  1080-1260 kcals/d  Protein:  36-43 g/d  Fluid:  >/= 1039ml/d  EDUCATION NEEDS:   Education needs addressed  Lailyn Appelbaum B. Zenia Resides, [place], [place]  (pager) Weekend/On-Call pager [phone])

## 2016-04-28 NOTE — Progress Notes (Signed)
[place] [facility] [place] NAME: Christina Hester    MR#:  [id number]  DATE OF BIRTH:  1940/09/10  SUBJECTIVE:  CHIEF COMPLAINT:   Chief Complaint  Patient presents with  . Cough     Came with Cough and weakness with chills for 2 days. She was recently treated with 10 days course of augmentin by PMD in last 2-3 weeks and then was given oral steroids due to sore throat. Today feels same, weak and have cough.  REVIEW OF SYSTEMS:  CONSTITUTIONAL: No fever, positive for fatigue or weakness.  EYES: No blurred or double vision.  EARS, NOSE, AND THROAT: No tinnitus or ear pain.  RESPIRATORY: positive for cough, shortness of breath, no wheezing or hemoptysis.  CARDIOVASCULAR: No chest pain, orthopnea, edema.  GASTROINTESTINAL: No nausea, vomiting, diarrhea or abdominal pain.  GENITOURINARY: No dysuria, hematuria.  ENDOCRINE: No polyuria, nocturia,  HEMATOLOGY: No anemia, easy bruising or bleeding SKIN: No rash or lesion. MUSCULOSKELETAL: No joint pain or arthritis.   NEUROLOGIC: No tingling, numbness, weakness.  PSYCHIATRY: No anxiety or depression.   ROS  DRUG ALLERGIES:   Allergies  Allergen Reactions  . Aspirin Tinitus  . Sulfa Antibiotics Nausea And Vomiting    VITALS:  Blood pressure (!) 151/58, pulse 82, temperature 97.8 F (36.6 C), temperature source Oral, resp. rate 18, height 5' (1.524 m), weight 36.1 kg (79 lb 9.6 oz), SpO2 95 %.  PHYSICAL EXAMINATION:  GENERAL: 75 y.o.-year-old white female patient, well-developed, thin but well-nourished lying in the bed in no acute distress.  Pleasant and cooperative.   HEENT: Head atraumatic, normocephalic. Pupils equal, round, reactive to light and accommodation. No scleral icterus. Extraocular muscles intact.Mucus membranes moist. NECK: Supple, full range of motion. No JVD, no bruit heard. No cervical lymphadenopathy. CHEST: Diminished breath sounds throughout. Slight expiratory wheeze. No  rales, rhonchi or crackles. No use of accessory muscles of respiration.  No reproducible chest wall tenderness.  Patient is speaking in full sentences, now satting at 96% on room air CARDIOVASCULAR: S1, S2 normal. No murmurs, rubs, or gallops appreciated. Cap refill <2 seconds. ABDOMEN: Soft, nontender, nondistended. No rebound, guarding, rigidity. Normoactive bowel sounds present in all four quadrants. No organomegaly or mass. EXTREMITIES: Full range of motion. No pedal edema, cyanosis, or clubbing. NEUROLOGIC: Cranial nerves II through XII are grossly intact with no focal sensorimotor deficit. Muscle strength 5/5 in all extremities. Sensation intact. Gait not checked. PSYCHIATRIC: The patient is alert and oriented x 3. Normal affect, mood, thought content. SKIN: Warm, dry, and intact without obvious rash, lesion, or ulcer. Physical Exam LABORATORY PANEL:   CBC  Recent Labs Lab 04/28/16 0352  WBC 18.1*  HGB 12.2  HCT 35.0  PLT 262   ------------------------------------------------------------------------------------------------------------------  Chemistries   Recent Labs Lab 04/27/16 2211 04/28/16 0352  NA 131* 133*  K 3.3* 3.2*  CL 94* 98*  CO2 29 28  GLUCOSE 90 129*  BUN 18 19  CREATININE 0.62 0.55  CALCIUM 9.1 8.5*  MG 1.7  --   AST 26  --   ALT 25  --   ALKPHOS 74  --   BILITOT 0.8  --    ------------------------------------------------------------------------------------------------------------------  Cardiac Enzymes  Recent Labs Lab 04/27/16 2211  TROPONINI <0.03   ------------------------------------------------------------------------------------------------------------------  RADIOLOGY:  Dg Chest 2 View  Result Date: 04/27/2016 CLINICAL DATA:  Cough for 1 week. Acute shortness of breath. Concern for pneumonia. EXAM: CHEST  2 VIEW COMPARISON:  Radiographs and chest  CT 11/25/2015 FINDINGS: Mild emphysema. Dependent opacity at the lung bases on the  lateral view not well characterized on PA imaging. Normal heart size and mediastinal contours. No pulmonary edema, pleural effusion or pneumothorax. No acute osseous abnormality. IMPRESSION: Dependent basilar opacity may be atelectasis or pneumonia, only appreciated on the lateral view. Electronically Signed   By: Jeb Levering M.D.   On: 04/27/2016 22:36    ASSESSMENT AND PLAN:   Active Problems:   Community acquired pneumonia  * Community acquired penumonia   Failed treatment with augmentin.   IV steroids, Nebs, Abx.  MRSA screen negative.   Sputum, cx  Sent.   Influenza screen is negative.  * COPD exacerbation   As above.  * Hypokalemia   Replace K,a nd Mg  * hyponatremia   IV fluids.  * smoking   Councelled to Quit smoking for 4 min.   All the records are reviewed and case discussed with Care Management/Social Workerr. Management plans discussed with the patient, family and they are in agreement.  CODE STATUS: Full.  TOTAL TIME TAKING CARE OF THIS PATIENT: 35 minutes.   POSSIBLE D/C IN 1-2 DAYS, DEPENDING ON CLINICAL CONDITION.   Vaughan Basta M.D on 04/28/2016   Between 7am to 6pm - Pager - [phone]  After 6pm go to [email or web address] - password [email or web address] [place] [facility]  Office  [phone]  CC: Primary care physician; Ronnell Freshwater, NP  Note: This dictation was prepared with Dragon dictation along with smaller phrase technology. Any transcriptional errors that result from this process are unintentional.

## 2016-04-28 NOTE — ED Notes (Signed)
Pt given sandwich tray before going to floor

## 2016-04-29 DIAGNOSIS — E43 Unspecified severe protein-calorie malnutrition: Secondary | ICD-10-CM | POA: Insufficient documentation

## 2016-04-29 LAB — CBC
HCT: 35.2 % (ref 35.0–47.0)
Hemoglobin: 12 g/dL (ref 12.0–16.0)
MCH: 31.4 pg (ref 26.0–34.0)
MCHC: 34.1 g/dL (ref 32.0–36.0)
MCV: 92.2 fL (ref 80.0–100.0)
PLATELETS: 265 10*3/uL (ref 150–440)
RBC: 3.82 MIL/uL (ref 3.80–5.20)
RDW: 13.1 % (ref 11.5–14.5)
WBC: 20.6 10*3/uL — ABNORMAL HIGH (ref 3.6–11.0)

## 2016-04-29 LAB — BASIC METABOLIC PANEL
Anion gap: 5 (ref 5–15)
BUN: 21 mg/dL — AB (ref 6–20)
CALCIUM: 9 mg/dL (ref 8.9–10.3)
CO2: 26 mmol/L (ref 22–32)
CREATININE: 0.47 mg/dL (ref 0.44–1.00)
Chloride: 100 mmol/L — ABNORMAL LOW (ref 101–111)
GFR calc Af Amer: >60 mL/min (ref >60)
GLUCOSE: 141 mg/dL — AB (ref 65–99)
POTASSIUM: 4.9 mmol/L (ref 3.5–5.1)
SODIUM: 131 mmol/L — AB (ref 135–145)

## 2016-04-29 MED ORDER — BROMPHENIRAMINE-PSEUDOEPH 1-15 MG/5ML PO ELIX: 5.0000 mL | ORAL_SOLUTION | Freq: Four times a day (QID) | ORAL | Status: DC | PRN

## 2016-04-29 MED ORDER — FLUTICASONE-SALMETEROL 100-50 MCG/DOSE IN AEPB: 1.0000 | INHALATION_SPRAY | Freq: Two times a day (BID) | RESPIRATORY_TRACT | 1 refills | Status: DC

## 2016-04-29 MED ORDER — ZOLPIDEM TARTRATE 5 MG PO TABS: 5.0000 mg | ORAL_TABLET | Freq: Every evening | ORAL | 0 refills | Status: DC | PRN

## 2016-04-29 MED ORDER — ALBUTEROL SULFATE (2.5 MG/3ML) 0.083% IN NEBU: 2.5000 mg | INHALATION_SOLUTION | Freq: Four times a day (QID) | RESPIRATORY_TRACT | 2 refills | Status: DC | PRN

## 2016-04-29 MED ORDER — CEFUROXIME AXETIL 250 MG PO TABS: 250.0000 mg | ORAL_TABLET | Freq: Two times a day (BID) | ORAL | 0 refills | Status: DC

## 2016-04-29 MED ORDER — HYDROCOD POLST-CPM POLST ER 10-8 MG/5ML PO SUER: 5.0000 mL | Freq: Two times a day (BID) | ORAL | Status: DC | PRN

## 2016-04-29 MED ORDER — BROMPHENIRAMINE-PSEUDOEPH 1-15 MG/5ML PO ELIX: 5.0000 mL | ORAL_SOLUTION | Freq: Four times a day (QID) | ORAL | 0 refills | Status: DC | PRN

## 2016-04-29 MED ORDER — PREDNISONE 10 MG (21) PO TBPK: ORAL_TABLET | ORAL | 0 refills | Status: DC

## 2016-04-29 NOTE — Progress Notes (Signed)
Pt being discharged today, discharge instructions reviewed with patient. 6 paper prescriptions given to patient and instructions on how to have them filled. Pt verified understanding of all instructions. All belongings returned to patient. She was wheeled out in wheelchair by staff.

## 2016-04-29 NOTE — Discharge Instructions (Signed)
Follow with PMD and pulmonary clinic.

## 2016-04-29 NOTE — Discharge Summary (Signed)
[facility] [place] NAME: Christina Hester    MR#:  [id number]  DATE OF BIRTH:  1941-04-25  DATE OF ADMISSION:  04/27/2016 ADMITTING PHYSICIAN: Harvie Bridge, DO  DATE OF DISCHARGE: 04/29/2016  PRIMARY CARE PHYSICIAN: Ronnell Freshwater, NP    ADMISSION DIAGNOSIS:  Cough [R05] Acute respiratory failure with hypoxia (HCC) [J96.01] Pulmonary emphysema, unspecified emphysema type ([place]) [J43.9]  DISCHARGE DIAGNOSIS:  Active Problems:   Community acquired pneumonia   Protein-calorie malnutrition, severe   COPD  SECONDARY DIAGNOSIS:   Past Medical History:  Diagnosis Date  . AAA (abdominal aortic aneurysm) [facility])     HOSPITAL COURSE:   * Community acquired penumonia   Failed treatment with augmentin.   IV steroids, Nebs, Abx.  MRSA screen negative.   Sputum, cx  Sent.   Influenza screen is negative.   She felt better, so discharging with oral cefuroxime, and advised to follow with PMD in 2 days and follow results of sputum cultures.  * COPD exacerbation   As above.   IV steroids, improved, giving inhaler Advair and albuterol nebs at home.   Advised to follow with Pulmonary clinic.  * Hypokalemia   Replace K,a nd Mg  * hyponatremia   IV fluids.  * smoking   Councelled to Quit smoking for 4 min.  DISCHARGE CONDITIONS:   Stable.  CONSULTS OBTAINED:    DRUG ALLERGIES:   Allergies  Allergen Reactions  . Aspirin Tinitus  . Sulfa Antibiotics Nausea And Vomiting    DISCHARGE MEDICATIONS:   Current Discharge Medication List    START taking these medications   Details  albuterol (PROVENTIL) (2.5 MG/3ML) 0.083% nebulizer solution Take 3 mLs (2.5 mg total) by nebulization every 6 (six) hours as needed for wheezing or shortness of breath. Qty: 75 mL, Refills: 2    brompheniramine-pseudoephedrine (DIMETAPP) 1-15 MG/5ML ELIX Take 5 mLs by mouth every 6 (six) hours as needed for allergies or  congestion. Qty: 280 mL, Refills: 0    cefUROXime (CEFTIN) 250 MG tablet Take 1 tablet (250 mg total) by mouth 2 (two) times daily with a meal. Qty: 10 tablet, Refills: 0    Fluticasone-Salmeterol (ADVAIR DISKUS) 100-50 MCG/DOSE AEPB Inhale 1 puff into the lungs 2 (two) times daily. Qty: 60 each, Refills: 1    predniSONE (STERAPRED UNI-PAK 21 TAB) 10 MG (21) TBPK tablet Take 6 tabs first day, 5 tab on day 2, then 4 on day 3rd, 3 tabs on day 4th , 2 tab on day 5th, and 1 tab on 6th day. Qty: 21 tablet, Refills: 0    zolpidem (AMBIEN) 5 MG tablet Take 1 tablet (5 mg total) by mouth at bedtime as needed for sleep. Qty: 5 tablet, Refills: 0      CONTINUE these medications which have NOT CHANGED   Details  albuterol (PROVENTIL HFA;VENTOLIN HFA) 108 (90 Base) MCG/ACT inhaler Inhale 2 puffs into the lungs every 6 (six) hours as needed for wheezing or shortness of breath.    cetirizine (ZYRTEC) 10 MG tablet Take 1 tablet by mouth daily.    Omega-3 Fatty Acids (FISH OIL PO) Take 1 capsule by mouth daily.      STOP taking these medications     predniSONE (DELTASONE) 10 MG tablet          DISCHARGE INSTRUCTIONS:    Follow with Pulm clinic I 2 weeks.  If you experience worsening of your admission symptoms, develop shortness of breath, life  threatening emergency, suicidal or homicidal thoughts you must seek medical attention immediately by calling 911 or calling your MD immediately  if symptoms less severe.  You Must read complete instructions/literature along with all the possible adverse reactions/side effects for all the Medicines you take and that have been prescribed to you. Take any new Medicines after you have completely understood and accept all the possible adverse reactions/side effects.   Please note  You were cared for by a hospitalist during your hospital stay. If you have any questions about your discharge medications or the care you received while you were in the  hospital after you are discharged, you can call the unit and asked to speak with the hospitalist on call if the hospitalist that took care of you is not available. Once you are discharged, your primary care physician will handle any further medical issues. Please note that NO REFILLS for any discharge medications will be authorized once you are discharged, as it is imperative that you return to your primary care physician (or establish a relationship with a primary care physician if you do not have one) for your aftercare needs so that they can reassess your need for medications and monitor your lab values.    Today   CHIEF COMPLAINT:   Chief Complaint  Patient presents with  . Cough    HISTORY OF PRESENT ILLNESS:  Christina Hester  is a 75 y.o. female with a known history of COPD presents to the emergency department complaining of Cough and shortness of breath.  Patient was in a usual state of health until 2 weeks ago when she developed cough productive of clear to white sputum. She was seen by her primary care doctor who prescribed Augmentin for 10 days which she completed. She did not feel significant improvement following the course of antibiotics that she visited with her physician again who prescribed prednisone. She has been taking prednisone for the past 5 days and has not seen any significant improvement in fact she states that her cough is worsening as is her shortness of breath and dyspnea on exertion. Nebulizers are ineffective. She denies any fevers or chills, chest pain or palpitations..  Otherwise there has been no change in status. Patient has been taking medication as prescribed and there has been no recent change in medication or diet.  There has been no recent illness, travel.  She does have a sick contact-her grandson had a viral upper respiratory infection that was severe last weekend.   VITAL SIGNS:  Blood pressure (!) 145/56, pulse 68, temperature 98 F (36.7 C),  temperature source Oral, resp. rate 20, height 5' (1.524 m), weight 36.1 kg (79 lb 9.6 oz), SpO2 93 %.  I/O:   Intake/Output Summary (Last 24 hours) at 04/29/16 0838 Last data filed at 04/29/16 0606  Gross per 24 hour  Intake              720 ml  Output              801 ml  Net              -81 ml    PHYSICAL EXAMINATION:  GENERAL:  75 y.o.-year-old thin patient lying in the bed with no acute distress.  EYES: Pupils equal, round, reactive to light and accommodation. No scleral icterus. Extraocular muscles intact.  HEENT: Head atraumatic, normocephalic. Oropharynx and nasopharynx clear.  NECK:  Supple, no jugular venous distention. No thyroid enlargement, no tenderness.  LUNGS: Normal breath  sounds bilaterally, no wheezing, rales,rhonchi or crepitation. No use of accessory muscles of respiration.  CARDIOVASCULAR: S1, S2 normal. No murmurs, rubs, or gallops.  ABDOMEN: Soft, non-tender, non-distended. Bowel sounds present. No organomegaly or mass.  EXTREMITIES: No pedal edema, cyanosis, or clubbing.  NEUROLOGIC: Cranial nerves II through XII are intact. Muscle strength 5/5 in all extremities. Sensation intact. Gait not checked.  PSYCHIATRIC: The patient is alert and oriented x 3.  SKIN: No obvious rash, lesion, or ulcer.   DATA REVIEW:   CBC  Recent Labs Lab 04/29/16 0415  WBC 20.6*  HGB 12.0  HCT 35.2  PLT 265    Chemistries   Recent Labs Lab 04/27/16 2211  04/29/16 0415  NA 131*  < > 131*  K 3.3*  < > 4.9  CL 94*  < > 100*  CO2 29  < > 26  GLUCOSE 90  < > 141*  BUN 18  < > 21*  CREATININE 0.62  < > 0.47  CALCIUM 9.1  < > 9.0  MG 1.7  --   --   AST 26  --   --   ALT 25  --   --   ALKPHOS 74  --   --   BILITOT 0.8  --   --   < > = values in this interval not displayed.  Cardiac Enzymes  Recent Labs Lab 04/27/16 2211  TROPONINI <0.03    Microbiology Results  Results for orders placed or performed during the hospital encounter of 04/27/16  Culture,  sputum-assessment     Status: None   Collection Time: 04/28/16  5:05 AM  Result Value Ref Range Status   Specimen Description EXPECTORATED SPUTUM  Final   Special Requests Normal  Final   Sputum evaluation THIS SPECIMEN IS ACCEPTABLE FOR SPUTUM CULTURE  Final   Report Status 04/28/2016 FINAL  Final  Culture, respiratory (NON-Expectorated)     Status: None (Preliminary result)   Collection Time: 04/28/16  5:05 AM  Result Value Ref Range Status   Specimen Description EXPECTORATED SPUTUM  Final   Special Requests Normal Reflexed from [id number]  Final   Gram Stain   Final    DEGENERATED CELLULAR MATERIAL PRESENT RARE SQUAMOUS EPITHELIAL CELLS PRESENT MODERATE GRAM POSITIVE COCCI IN PAIRS RARE GRAM NEGATIVE RODS RARE GRAM POSITIVE RODS RARE GRAM POSITIVE COCCI IN CLUSTERS Performed at [facility]    Culture PENDING  Incomplete   Report Status PENDING  Incomplete  MRSA PCR Screening     Status: None   Collection Time: 04/28/16  8:52 AM  Result Value Ref Range Status   MRSA by PCR NEGATIVE NEGATIVE Final    Comment:        The GeneXpert MRSA Assay (FDA approved for NASAL specimens only), is one component of a comprehensive MRSA colonization surveillance program. It is not intended to diagnose MRSA infection nor to guide or monitor treatment for MRSA infections.     RADIOLOGY:  Dg Chest 2 View  Result Date: 04/27/2016 CLINICAL DATA:  Cough for 1 week. Acute shortness of breath. Concern for pneumonia. EXAM: CHEST  2 VIEW COMPARISON:  Radiographs and chest CT 11/25/2015 FINDINGS: Mild emphysema. Dependent opacity at the lung bases on the lateral view not well characterized on PA imaging. Normal heart size and mediastinal contours. No pulmonary edema, pleural effusion or pneumothorax. No acute osseous abnormality. IMPRESSION: Dependent basilar opacity may be atelectasis or pneumonia, only appreciated on the lateral view. Electronically Signed   By:  Jeb Levering M.D.   On:  04/27/2016 22:36    EKG:   Orders placed or performed during the hospital encounter of 04/27/16  . ED EKG  . ED EKG  . EKG 12-Lead  . EKG 12-Lead      Management plans discussed with the patient, family and they are in agreement.  CODE STATUS:     Code Status Orders        Start     Ordered   04/28/16 0157  Full code  Continuous     04/28/16 0156    Code Status History    Date Active Date Inactive Code Status Order ID Comments User Context   11/25/2015  7:33 PM 11/26/2015  3:19 PM Full Code [id number]  Nicholes Mango, MD Inpatient      TOTAL TIME TAKING CARE OF THIS PATIENT: 35 minutes.    Vaughan Basta M.D on 04/29/2016 at 8:38 AM  Between 7am to 6pm - Pager - [phone]  After 6pm go to [email or web address] - password [email or web address] [facility]  Office  [phone]  CC: Primary care physician; Ronnell Freshwater, NP   Note: This dictation was prepared with Dragon dictation along with smaller phrase technology. Any transcriptional errors that result from this process are unintentional.

## 2016-04-30 LAB — CULTURE, RESPIRATORY
CULTURE: NORMAL
SPECIAL REQUESTS: NORMAL

## 2016-04-30 LAB — CULTURE, RESPIRATORY W GRAM STAIN

## 2016-05-04 DIAGNOSIS — R062 Wheezing: Secondary | ICD-10-CM | POA: Diagnosis not present

## 2016-05-04 DIAGNOSIS — J189 Pneumonia, unspecified organism: Secondary | ICD-10-CM | POA: Diagnosis not present

## 2016-05-04 DIAGNOSIS — F17210 Nicotine dependence, cigarettes, uncomplicated: Secondary | ICD-10-CM | POA: Diagnosis not present

## 2016-05-05 ENCOUNTER — Other Ambulatory Visit: Payer: Self-pay | Admitting: Nurse Practitioner

## 2016-05-05 ENCOUNTER — Ambulatory Visit
Admission: RE | Admit: 2016-05-05 | Discharge: 2016-05-05 | Disposition: A | Discharge status: Home / Self Care | Payer: Medicare Other | Source: Ambulatory Visit | Attending: Nurse Practitioner | Admitting: Nurse Practitioner

## 2016-05-05 DIAGNOSIS — R05 Cough: Secondary | ICD-10-CM

## 2016-05-05 DIAGNOSIS — R059 Cough, unspecified: Secondary | ICD-10-CM

## 2016-05-05 DIAGNOSIS — J440 Chronic obstructive pulmonary disease with acute lower respiratory infection: Secondary | ICD-10-CM | POA: Insufficient documentation

## 2016-05-05 DIAGNOSIS — J189 Pneumonia, unspecified organism: Secondary | ICD-10-CM | POA: Diagnosis not present

## 2016-05-09 ENCOUNTER — Ambulatory Visit: Payer: Medicare Other | Admitting: Pharmacist

## 2016-05-09 DIAGNOSIS — Z79899 Other long term (current) drug therapy: Secondary | ICD-10-CM

## 2016-05-09 NOTE — Patient Instructions (Addendum)
Today we started to fill out the LIS application online for assistance with Medicare Part D. Please call the number printed from the web site to complete the application.  It may take up to 6 weeks before you receive an answer.  If approved, you should receive a letter in the mail from [employer] outlining the details of the LIS. On the following month, the LIS will go into effect and the 1st.  Social Security may automatically enroll you into a Part D plan. If this plan does not appear to be working for you, please call [facility] to review and change your Part D plan.  In the meantime, call Medicare to inquire about waving the penalty for the Medicare Part D prescription plan.  Call Nurse Practitioner Leretha Pol to inquire about using a lower dose nicotine replacement patch. If this is approved, the [facility] can assist with the patches, gum or lozenges.

## 2016-05-09 NOTE — Progress Notes (Addendum)
[name] is here today to review Medicare Part D/Advantage plans.  Patient's current medication list was reviewed and all of their medications except cetrizine and the fish oil can be obtained through a Medicare Part D/Advantage plan.  Patient is eligible to sign up for a Medicare Part D. She was eligible to sign up for a plan in July of 2008. Up unto this year, she was not on any prescription medications.  Medicare.gov was used to review the available Part D plans.  Patient currently has a supplement plan (F) that assists with Medicare Part A and B. So she would not be able to sign up for the Medicare Advantage plan only the Part D plan.  Patient may qualify for the following subsidies: LIS. The LIS application was started online. The patient will need to call Social Security to finish the application and screening.  It should take about 6 weeks before [name] will know if she qualifies for LIS subsidy. As of today, it appears that she will not qualify for the [facility] subsidy.  [name] was also encouraged to call Medicare and ask them to wave the penalty. As of now, she has accrued 113 months worth of penalty for not signing up for the Part D plan back in 2008.  She is currently taking 2 prescription medications and 2 over-the-counter medications. She may start taking Spiriva and was recently prescribed Advair which was never filled. She recalls a "bad reaction" to Symbicort where she felt "out of her mind" and stopped taking it.  She recently tried the nicotine patch (14 mg) for 3 days. Today she states that "the patch made my chest hurt with pressure". She stated that as soon as she took the patch off, the pressure subsided. Patient was encouraged to contact her provider to discuss. She was encouraged to ask her provider if a lesser strength patch might be appropriate or another option such as the lozenges. Patient was given the Quit[facility] [place] number.   Outpatient Encounter Prescriptions  as of 05/09/2016  Medication Sig  . albuterol (PROVENTIL HFA;VENTOLIN HFA) 108 (90 Base) MCG/ACT inhaler Inhale 2 puffs into the lungs every 6 (six) hours as needed for wheezing or shortness of breath.  Marland Kitchen albuterol (PROVENTIL) (2.5 MG/3ML) 0.083% nebulizer solution Take 3 mLs (2.5 mg total) by nebulization every 6 (six) hours as needed for wheezing or shortness of breath.  . cetirizine (ZYRTEC) 10 MG tablet Take 1 tablet by mouth daily.  . Omega-3 Fatty Acids (FISH OIL PO) Take 1 capsule by mouth daily.  . [DISCONTINUED] brompheniramine-pseudoephedrine (DIMETAPP) 1-15 MG/5ML ELIX Take 5 mLs by mouth every 6 (six) hours as needed for allergies or congestion. (Patient not taking: Reported on 05/09/2016)  . [DISCONTINUED] cefUROXime (CEFTIN) 250 MG tablet Take 1 tablet (250 mg total) by mouth 2 (two) times daily with a meal. (Patient not taking: Reported on 05/09/2016)  . [DISCONTINUED] Fluticasone-Salmeterol (ADVAIR DISKUS) 100-50 MCG/DOSE AEPB Inhale 1 puff into the lungs 2 (two) times daily. (Patient not taking: Reported on 05/09/2016)  . [DISCONTINUED] predniSONE (STERAPRED UNI-PAK 21 TAB) 10 MG (21) TBPK tablet Take 6 tabs first day, 5 tab on day 2, then 4 on day 3rd, 3 tabs on day 4th , 2 tab on day 5th, and 1 tab on 6th day. (Patient not taking: Reported on 05/09/2016)  . [DISCONTINUED] zolpidem (AMBIEN) 5 MG tablet Take 1 tablet (5 mg total) by mouth at bedtime as needed for sleep. (Patient not taking: Reported on 05/09/2016)  No facility-administered encounter medications on file as of 05/09/2016.     Patient will follow up with [facility] on 06-05-16 to review status of LIS and select a Part D plan for 2018.   Christina Hester K. Dicky Doe, PharmD [facility] [place] Operations Coordinator [phone]

## 2016-05-16 ENCOUNTER — Other Ambulatory Visit (INDEPENDENT_AMBULATORY_CARE_PROVIDER_SITE_OTHER): Payer: Self-pay | Admitting: Vascular Surgery

## 2016-05-16 DIAGNOSIS — I739 Peripheral vascular disease, unspecified: Secondary | ICD-10-CM

## 2016-05-16 DIAGNOSIS — I77811 Abdominal aortic ectasia: Secondary | ICD-10-CM

## 2016-05-17 ENCOUNTER — Ambulatory Visit (INDEPENDENT_AMBULATORY_CARE_PROVIDER_SITE_OTHER): Payer: Self-pay | Admitting: Vascular Surgery

## 2016-05-17 ENCOUNTER — Encounter (INDEPENDENT_AMBULATORY_CARE_PROVIDER_SITE_OTHER): Payer: Self-pay | Admitting: Vascular Surgery

## 2016-05-17 ENCOUNTER — Ambulatory Visit (INDEPENDENT_AMBULATORY_CARE_PROVIDER_SITE_OTHER): Payer: Medicare Other

## 2016-05-17 ENCOUNTER — Ambulatory Visit (INDEPENDENT_AMBULATORY_CARE_PROVIDER_SITE_OTHER): Payer: Medicare Other | Admitting: Vascular Surgery

## 2016-05-17 VITALS — BP 144/72 | HR 64 | Resp 16 | Ht 60.0 in | Wt 79.4 lb

## 2016-05-17 DIAGNOSIS — I714 Abdominal aortic aneurysm, without rupture, unspecified: Secondary | ICD-10-CM

## 2016-05-17 DIAGNOSIS — I77811 Abdominal aortic ectasia: Secondary | ICD-10-CM | POA: Diagnosis not present

## 2016-05-17 DIAGNOSIS — I739 Peripheral vascular disease, unspecified: Secondary | ICD-10-CM | POA: Diagnosis not present

## 2016-05-17 NOTE — Progress Notes (Signed)
Subjective:    Patient ID: Christina Hester, female    DOB: Jan 05, 1941, 75 y.o.   MRN: [id number] Chief Complaint  Patient presents with  . Follow-up   Patient presents for a yearly AAA and PVD follow up. Over the last year the patient has stopped smoking tobacco and has started walking daily. She underwent an aortic duplex which was notable for an abdominal aortic aneurysm measuring 3.02cm AP x 3.02cm transverse (previous 2.69cm). She denies any symptoms such as back pain, pulsatile abdominal masses or thrombosis in his extremities. Her hypertension is adequately controlled and she has remained abstinent of tobacco use. The patient underwent an ABI which showed Right ABI: 1.22 and Left 1.11 (on 05/18/15, Right ABI: 1.02 and Left 0.86). The patient denies any claudication like symptoms, rest pain or ulcers to her feet / toes.    Review of Systems  Constitutional: Negative.   HENT: Negative.   Eyes: Negative.   Respiratory: Negative.   Cardiovascular: Negative.   Gastrointestinal: Negative.   Endocrine: Negative.   Genitourinary: Negative.   Musculoskeletal: Negative.   Skin: Negative.   Allergic/Immunologic: Negative.   Neurological: Negative.   Hematological: Negative.       Objective:   Physical Exam  Constitutional: She is oriented to person, place, and time. She appears well-developed and well-nourished.  HENT:  Head: Normocephalic and atraumatic.  Eyes: Conjunctivae and EOM are normal. Pupils are equal, round, and reactive to light.  Neck: Normal range of motion.  Cardiovascular: Normal rate, regular rhythm, normal heart sounds and intact distal pulses.   Pulses:      Radial pulses are 2+ on the right side, and 2+ on the left side.       Dorsalis pedis pulses are 2+ on the right side, and 2+ on the left side.       Posterior tibial pulses are 2+ on the right side, and 2+ on the left side.  Pulmonary/Chest: Effort normal and breath sounds normal.  Abdominal: Soft. Bowel  sounds are normal.  Musculoskeletal: Normal range of motion. She exhibits no edema.  Neurological: She is alert and oriented to person, place, and time.  Skin: Skin is warm and dry.  Psychiatric: She has a normal mood and affect. Her behavior is normal. Judgment and thought content normal.   BP (!) 144/72   Pulse 64   Resp 16   Ht 5' (1.524 m)   Wt 79 lb 6.4 oz (36 kg)   BMI 15.51 kg/m   Past Medical History:  Diagnosis Date  . AAA (abdominal aortic aneurysm) [facility])    Social History   Social History  . Marital status: Divorced    Spouse name: N/A  . Number of children: N/A  . Years of education: N/A   Occupational History  . Not on file.   Social History Main Topics  . Smoking status: Former Smoker    Packs/day: 0.25  . Smokeless tobacco: Never Used  . Alcohol use No  . Drug use: No  . Sexual activity: Not on file   Other Topics Concern  . Not on file   Social History Narrative  . No narrative on file   Past Surgical History:  Procedure Laterality Date  . ABDOMINAL HYSTERECTOMY     History reviewed. No pertinent family history.  Allergies  Allergen Reactions  . Aspirin Tinitus  . Sulfa Antibiotics Nausea And Vomiting  . Symbicort [Budesonide-Formoterol Fumarate] Other (See Comments)    Patient felt "out of  her mind" and angry.      Assessment & Plan:  Patient presents for a yearly AAA and PVD follow up. Over the last year the patient has stopped smoking tobacco and has started walking daily. She underwent an aortic duplex which was notable for an abdominal aortic aneurysm measuring 3.02cm AP x 3.02cm transverse (previous 2.69cm). She denies any symptoms such as back pain, pulsatile abdominal masses or thrombosis in his extremities. Her hypertension is adequately controlled and she has remained abstinent of tobacco use. The patient underwent an ABI which showed Right ABI: 1.22 and Left 1.11 (on 05/18/15, Right ABI: 1.02 and Left 0.86). The patient denies any  claudication like symptoms, rest pain or ulcers to her feet / toes.   1. AAA (abdominal aortic aneurysm) without rupture (HCC) - Stable Studies reviewed with patient. Duplex stable, physical exam unremarkable. No surgery or intervention at this time. The patient to follow up in one year with an aortic duplex. The patient has an asymptomatic abdominal aortic aneurysm that is less than 4 cm in maximal diameter.  I have reviewed the natural history of abdominal aortic aneurysm and the small risk of rupture for aneurysm less than 5 cm in size.  However, as these small aneurysms tend to enlarge over time, continued surveillance with ultrasound or CT scan is mandatory.  The patient's blood pressure is being adequately controlled however I have reviewed the importance of hypertension and lipid control and the importance of continuing his abstinence from tobacco.  The patient is also encouraged to exercise a minimum of 30 minutes 4 times a week.  Should the patient develop new onset abdominal or back pain or signs of peripheral embolization they are instructed to seek medical attention immediately and to alert the physician providing care that they have an aneurysm.  The patient voices their understanding.  2. PVD (peripheral vascular disease) ([place]) - Stable Studies reviewed with patient. Asymptomatic, normal physical exam. No intervention indicated. Patient to follow up in one year with ABI. Patient to remain abstinent of tobacco use. I have discussed with the patient at length the risk factors for and pathogenesis of atherosclerotic disease and encouraged a healthy diet, regular exercise regimen and blood pressure / glucose control.  The patient was encouraged to call the office in the interim if he experiences any claudication like symptoms, rest pain or ulcers to his feet / toes.  Current Outpatient Prescriptions on File Prior to Visit  Medication Sig Dispense Refill  . albuterol (PROVENTIL  HFA;VENTOLIN HFA) 108 (90 Base) MCG/ACT inhaler Inhale 2 puffs into the lungs every 6 (six) hours as needed for wheezing or shortness of breath.    Marland Kitchen albuterol (PROVENTIL) (2.5 MG/3ML) 0.083% nebulizer solution Take 3 mLs (2.5 mg total) by nebulization every 6 (six) hours as needed for wheezing or shortness of breath. 75 mL 2  . cetirizine (ZYRTEC) 10 MG tablet Take 1 tablet by mouth daily.    . Omega-3 Fatty Acids (FISH OIL PO) Take 1 capsule by mouth daily.     No current facility-administered medications on file prior to visit.     There are no Patient Instructions on file for this visit. No Follow-up on file.   KIMBERLY A STEGMAYER, PA-C

## 2016-05-19 ENCOUNTER — Encounter (INDEPENDENT_AMBULATORY_CARE_PROVIDER_SITE_OTHER): Payer: Self-pay

## 2016-05-19 ENCOUNTER — Other Ambulatory Visit (INDEPENDENT_AMBULATORY_CARE_PROVIDER_SITE_OTHER): Payer: Self-pay

## 2016-05-29 DIAGNOSIS — Z23 Encounter for immunization: Secondary | ICD-10-CM | POA: Diagnosis not present

## 2016-06-05 ENCOUNTER — Encounter: Payer: Medicare Other | Admitting: Pharmacist

## 2016-06-15 DIAGNOSIS — R05 Cough: Secondary | ICD-10-CM | POA: Diagnosis not present

## 2016-06-15 DIAGNOSIS — J069 Acute upper respiratory infection, unspecified: Secondary | ICD-10-CM | POA: Diagnosis not present

## 2016-06-15 DIAGNOSIS — R062 Wheezing: Secondary | ICD-10-CM | POA: Diagnosis not present

## 2016-06-15 DIAGNOSIS — F17210 Nicotine dependence, cigarettes, uncomplicated: Secondary | ICD-10-CM | POA: Diagnosis not present

## 2016-07-18 ENCOUNTER — Encounter: Payer: Self-pay | Admitting: Emergency Medicine

## 2016-07-18 ENCOUNTER — Inpatient Hospital Stay
Admission: EM | Admit: 2016-07-18 | Discharge: 2016-07-20 | DRG: 190 | Disposition: A | Discharge status: Home / Self Care | Payer: Medicare Other | Attending: Internal Medicine | Admitting: Internal Medicine

## 2016-07-18 ENCOUNTER — Emergency Department: Payer: Medicare Other

## 2016-07-18 DIAGNOSIS — J441 Chronic obstructive pulmonary disease with (acute) exacerbation: Secondary | ICD-10-CM | POA: Diagnosis present

## 2016-07-18 DIAGNOSIS — I714 Abdominal aortic aneurysm, without rupture: Secondary | ICD-10-CM | POA: Diagnosis present

## 2016-07-18 DIAGNOSIS — Z79899 Other long term (current) drug therapy: Secondary | ICD-10-CM

## 2016-07-18 DIAGNOSIS — R05 Cough: Secondary | ICD-10-CM | POA: Diagnosis not present

## 2016-07-18 DIAGNOSIS — J189 Pneumonia, unspecified organism: Secondary | ICD-10-CM | POA: Diagnosis present

## 2016-07-18 DIAGNOSIS — R197 Diarrhea, unspecified: Secondary | ICD-10-CM | POA: Diagnosis not present

## 2016-07-18 DIAGNOSIS — E876 Hypokalemia: Secondary | ICD-10-CM | POA: Diagnosis present

## 2016-07-18 DIAGNOSIS — J9601 Acute respiratory failure with hypoxia: Secondary | ICD-10-CM | POA: Diagnosis present

## 2016-07-18 DIAGNOSIS — Z886 Allergy status to analgesic agent status: Secondary | ICD-10-CM

## 2016-07-18 DIAGNOSIS — E785 Hyperlipidemia, unspecified: Secondary | ICD-10-CM | POA: Diagnosis not present

## 2016-07-18 DIAGNOSIS — J181 Lobar pneumonia, unspecified organism: Secondary | ICD-10-CM

## 2016-07-18 DIAGNOSIS — R0602 Shortness of breath: Secondary | ICD-10-CM | POA: Diagnosis not present

## 2016-07-18 DIAGNOSIS — Z7951 Long term (current) use of inhaled steroids: Secondary | ICD-10-CM | POA: Diagnosis not present

## 2016-07-18 DIAGNOSIS — J449 Chronic obstructive pulmonary disease, unspecified: Secondary | ICD-10-CM | POA: Diagnosis not present

## 2016-07-18 DIAGNOSIS — J440 Chronic obstructive pulmonary disease with acute lower respiratory infection: Secondary | ICD-10-CM | POA: Diagnosis present

## 2016-07-18 DIAGNOSIS — R262 Difficulty in walking, not elsewhere classified: Secondary | ICD-10-CM

## 2016-07-18 DIAGNOSIS — Z888 Allergy status to other drugs, medicaments and biological substances status: Secondary | ICD-10-CM | POA: Diagnosis not present

## 2016-07-18 DIAGNOSIS — Z882 Allergy status to sulfonamides status: Secondary | ICD-10-CM | POA: Diagnosis not present

## 2016-07-18 DIAGNOSIS — Z87891 Personal history of nicotine dependence: Secondary | ICD-10-CM

## 2016-07-18 DIAGNOSIS — Z803 Family history of malignant neoplasm of breast: Secondary | ICD-10-CM

## 2016-07-18 DIAGNOSIS — R062 Wheezing: Secondary | ICD-10-CM | POA: Diagnosis not present

## 2016-07-18 HISTORY — DX: Chronic obstructive pulmonary disease, unspecified: J44.9

## 2016-07-18 LAB — BASIC METABOLIC PANEL
Anion gap: 7 (ref 5–15)
BUN: 16 mg/dL (ref 6–20)
CHLORIDE: 95 mmol/L — AB (ref 101–111)
CO2: 27 mmol/L (ref 22–32)
Calcium: 8.8 mg/dL — ABNORMAL LOW (ref 8.9–10.3)
Creatinine, Ser: 0.6 mg/dL (ref 0.44–1.00)
GFR calc Af Amer: >60 mL/min (ref >60)
GFR calc non Af Amer: >60 mL/min (ref >60)
Glucose, Bld: 107 mg/dL — ABNORMAL HIGH (ref 65–99)
Potassium: 3.8 mmol/L (ref 3.5–5.1)
SODIUM: 129 mmol/L — AB (ref 135–145)

## 2016-07-18 LAB — CBC
HCT: 37.1 % (ref 35.0–47.0)
Hemoglobin: 12.9 g/dL (ref 12.0–16.0)
MCH: 31.2 pg (ref 26.0–34.0)
MCHC: 34.8 g/dL (ref 32.0–36.0)
MCV: 89.8 fL (ref 80.0–100.0)
PLATELETS: 165 10*3/uL (ref 150–440)
RBC: 4.13 MIL/uL (ref 3.80–5.20)
RDW: 13.5 % (ref 11.5–14.5)
WBC: 10.3 10*3/uL (ref 3.6–11.0)

## 2016-07-18 LAB — RAPID INFLUENZA A&B ANTIGENS (ARMC ONLY)
INFLUENZA A (ARMC): NEGATIVE
INFLUENZA B (ARMC): NEGATIVE

## 2016-07-18 LAB — TROPONIN I: Troponin I: <0.03 ng/mL (ref <0.03)

## 2016-07-18 MED ORDER — LEVOFLOXACIN 500 MG PO TABS
500.0000 mg | ORAL_TABLET | Freq: Once | ORAL | Status: AC
Start: 2016-07-19 — End: 2016-07-19
  Administered 2016-07-19: 18:00:00 500 mg via ORAL
  Filled 2016-07-18: qty 1

## 2016-07-18 MED ORDER — CEFTRIAXONE SODIUM-DEXTROSE 1-3.74 GM-% IV SOLR
1.0000 g | Freq: Once | INTRAVENOUS | Status: AC
  Administered 2016-07-18: 1 g via INTRAVENOUS
  Filled 2016-07-18: qty 50

## 2016-07-18 MED ORDER — DEXTROSE 5 % IV SOLN
500.0000 mg | Freq: Once | INTRAVENOUS | Status: AC
  Administered 2016-07-18: 500 mg via INTRAVENOUS
  Filled 2016-07-18: qty 500

## 2016-07-18 MED ORDER — ALBUTEROL SULFATE (2.5 MG/3ML) 0.083% IN NEBU
2.5000 mg | INHALATION_SOLUTION | Freq: Four times a day (QID) | RESPIRATORY_TRACT | Status: DC | PRN
  Administered 2016-07-20: 2.5 mg via RESPIRATORY_TRACT
  Filled 2016-07-18: qty 3

## 2016-07-18 MED ORDER — DEXTROSE 5 % IV SOLN: 1.0000 g | Freq: Once | INTRAVENOUS | Status: DC

## 2016-07-18 MED ORDER — LORATADINE 10 MG PO TABS
10.0000 mg | ORAL_TABLET | Freq: Every day | ORAL | Status: DC | PRN
Start: 2016-07-18 — End: 2016-07-20

## 2016-07-18 MED ORDER — IPRATROPIUM-ALBUTEROL 0.5-2.5 (3) MG/3ML IN SOLN
3.0000 mL | RESPIRATORY_TRACT | Status: DC
  Administered 2016-07-18 – 2016-07-19 (×3): 3 mL via RESPIRATORY_TRACT
  Filled 2016-07-18 (×3): qty 3

## 2016-07-18 MED ORDER — IPRATROPIUM-ALBUTEROL 0.5-2.5 (3) MG/3ML IN SOLN
3.0000 mL | Freq: Once | RESPIRATORY_TRACT | Status: AC
  Administered 2016-07-18: 3 mL via RESPIRATORY_TRACT
  Filled 2016-07-18: qty 3

## 2016-07-18 MED ORDER — LEVOFLOXACIN 500 MG PO TABS: 250.0000 mg | ORAL_TABLET | Freq: Every day | ORAL | Status: DC

## 2016-07-18 MED ORDER — METHYLPREDNISOLONE SODIUM SUCC 125 MG IJ SOLR
60.0000 mg | Freq: Four times a day (QID) | INTRAMUSCULAR | Status: DC
  Administered 2016-07-18 – 2016-07-19 (×2): 60 mg via INTRAVENOUS
  Filled 2016-07-18 (×2): qty 2

## 2016-07-18 MED ORDER — METHYLPREDNISOLONE SODIUM SUCC 125 MG IJ SOLR
125.0000 mg | Freq: Once | INTRAMUSCULAR | Status: AC
  Administered 2016-07-18: 125 mg via INTRAVENOUS
  Filled 2016-07-18: qty 2

## 2016-07-18 MED ORDER — TIOTROPIUM BROMIDE MONOHYDRATE 18 MCG IN CAPS
18.0000 ug | ORAL_CAPSULE | Freq: Every day | RESPIRATORY_TRACT | Status: DC
  Administered 2016-07-19: 08:00:00 18 ug via RESPIRATORY_TRACT
  Filled 2016-07-18: qty 5

## 2016-07-18 MED ORDER — HEPARIN SODIUM (PORCINE) 5000 UNIT/ML IJ SOLN
5000.0000 [IU] | Freq: Three times a day (TID) | INTRAMUSCULAR | Status: DC
  Filled 2016-07-18: qty 1

## 2016-07-18 MED ORDER — LORAZEPAM 0.5 MG PO TABS
ORAL_TABLET | ORAL | Status: AC
  Administered 2016-07-18: 0.5 mg via ORAL
  Filled 2016-07-18: qty 1

## 2016-07-18 MED ORDER — LORAZEPAM 0.5 MG PO TABS
0.5000 mg | ORAL_TABLET | Freq: Once | ORAL | Status: AC
  Administered 2016-07-18: 0.5 mg via ORAL

## 2016-07-18 MED ORDER — OMEGA-3-ACID ETHYL ESTERS 1 G PO CAPS
1.0000 g | ORAL_CAPSULE | Freq: Every day | ORAL | Status: DC
  Administered 2016-07-19 – 2016-07-20 (×2): 1 g via ORAL
  Filled 2016-07-18 (×2): qty 1

## 2016-07-18 NOTE — Progress Notes (Signed)
Family Meeting Note  Advance Directive:no  Today a meeting took place with the Patient and daughter.   The following clinical team members were present during this meeting:MD  The following were discussed:Patient's diagnosis: COPD , Patient's progosis: Unable to determine and Goals for treatment: Full Code  Additional follow-up to be provided: PMD  Time spent during discussion:20 minutes  Phill Steck, Rosalio Macadamia, MD

## 2016-07-18 NOTE — ED Notes (Signed)
Pt being transferred to rm 116 by this tech.

## 2016-07-18 NOTE — Progress Notes (Signed)
ANTIBIOTIC CONSULT NOTE - INITIAL  Pharmacy Consult for Levaquin  Indication: pneumonia  Allergies  Allergen Reactions  . Aspirin Tinitus  . Sulfa Antibiotics Nausea And Vomiting  . Symbicort [Budesonide-Formoterol Fumarate] Other (See Comments)    Patient felt "out of her mind" and angry.    Patient Measurements: Height: 5\' 1"  (154.9 cm) Weight: 83 lb (37.6 kg) IBW/kg (Calculated) : 47.8 Adjusted Body Weight:   Vital Signs: Temp: 97.7 F (36.5 C) (01/02 1832) Temp Source: Oral (01/02 1832) BP: 140/56 (01/02 1832) Pulse Rate: 82 (01/02 1832) Intake/Output from previous day: No intake/output data recorded. Intake/Output from this shift: Total I/O In: 250 [IV Piggyback:250] Out: -   Labs:  Recent Labs  07/18/16 1242  WBC 10.3  HGB 12.9  PLT 165  CREATININE 0.60   Estimated Creatinine Clearance: 36.6 mL/min (by C-G formula based on SCr of 0.6 mg/dL). No results for input(s): VANCOTROUGH, VANCOPEAK, VANCORANDOM, GENTTROUGH, GENTPEAK, GENTRANDOM, TOBRATROUGH, TOBRAPEAK, TOBRARND, AMIKACINPEAK, AMIKACINTROU, AMIKACIN in the last 72 hours.   Microbiology: Recent Results (from the past 720 hour(s))  Rapid Influenza A&B Antigens ([place] only)     Status: None   Collection Time: 07/18/16  4:20 PM  Result Value Ref Range Status   Influenza A ([facility]) NEGATIVE NEGATIVE Final   Influenza B ([facility]) NEGATIVE NEGATIVE Final    Medical History: Past Medical History:  Diagnosis Date  . AAA (abdominal aortic aneurysm) ([place])   . COPD (chronic obstructive pulmonary disease) ([facility])     Medications:  Prescriptions Prior to Admission  Medication Sig Dispense Refill Last Dose  . albuterol (PROVENTIL HFA;VENTOLIN HFA) 108 (90 Base) MCG/ACT inhaler Inhale 2 puffs into the lungs every 6 (six) hours as needed for wheezing or shortness of breath.   prn at prn  . albuterol (PROVENTIL) (2.5 MG/3ML) 0.083% nebulizer solution Take 3 mLs (2.5 mg total) by nebulization every 6 (six) hours as  needed for wheezing or shortness of breath. 75 mL 2 prn at prn  . cetirizine (ZYRTEC) 10 MG tablet Take 1 tablet by mouth daily.   prn at prn  . Omega-3 Fatty Acids (FISH OIL PO) Take 1 capsule by mouth daily.   unknown at unknown   Assessment: CrCl = 36.6 ml/min   Goal of Therapy:  resolution of infection  Plan:  Expected duration 7 days with resolution of temperature and/or normalization of WBC   Azithromycin 500 mg IV X 1 given on 07/18/16. Levaquin 500 mg PO X 1 ordered to be given on 1/3 followed by levaquin 250 mg PO Q24H to start on 1/4 .   Ayah Cozzolino D 07/18/2016,6:52 PM

## 2016-07-18 NOTE — ED Triage Notes (Signed)
Pt to ED via POV from PCP for SOB, pt hx of COPD and pneumonia recently with productive cough and chills. Pt afebrile. Lungs sounds diminished. Pt denies CP. 93%on RA. Pt A&Ox4

## 2016-07-18 NOTE — ED Provider Notes (Signed)
[facility] Emergency Department Provider Note   ____________________________________________    I have reviewed the triage vital signs and the nursing notes.   HISTORY  Chief Complaint Shortness of Breath     HPI Christina Hester is a 76 y.o. female who presents with complaints of shortness of breath and cough. She reports worsening shortness of breath over the last several days. She reports a history of COPD. Denies chest pain. She has not had a fever that she knows of. No nausea or vomiting or diaphoresis. She tried using albuterol nebulizer at home without improvement   Past Medical History:  Diagnosis Date  . AAA (abdominal aortic aneurysm) ([place])   . COPD (chronic obstructive pulmonary disease) [facility])     Patient Active Problem List   Diagnosis Date Noted  . AAA (abdominal aortic aneurysm) without rupture ([place]) 05/17/2016  . PVD (peripheral vascular disease) ([place]) 05/17/2016  . Protein-calorie malnutrition, severe 04/29/2016  . Community acquired pneumonia 04/28/2016  . Chest pain 11/25/2015    Past Surgical History:  Procedure Laterality Date  . ABDOMINAL HYSTERECTOMY      Prior to Admission medications   Medication Sig Start Date End Date Taking? Authorizing Provider  albuterol (PROVENTIL HFA;VENTOLIN HFA) 108 (90 Base) MCG/ACT inhaler Inhale 2 puffs into the lungs every 6 (six) hours as needed for wheezing or shortness of breath.    Historical Provider, MD  albuterol (PROVENTIL) (2.5 MG/3ML) 0.083% nebulizer solution Take 3 mLs (2.5 mg total) by nebulization every 6 (six) hours as needed for wheezing or shortness of breath. 04/29/16   Vaughan Basta, MD  cetirizine (ZYRTEC) 10 MG tablet Take 1 tablet by mouth daily.    Historical Provider, MD  Omega-3 Fatty Acids (FISH OIL PO) Take 1 capsule by mouth daily.    Historical Provider, MD  omeprazole (PRILOSEC) 20 MG capsule Take 20 mg by mouth daily.    Historical Provider, MD       Allergies Aspirin; Sulfa antibiotics; and Symbicort [budesonide-formoterol fumarate]  No family history on file.  Social History Social History  Substance Use Topics  . Smoking status: Former Smoker    Packs/day: 0.25  . Smokeless tobacco: Never Used  . Alcohol use No    Review of Systems  Constitutional: No fever/chills  Cardiovascular: Denies chest pain. Respiratory: As above Gastrointestinal: No abdominal pain.  No nausea, no vomiting.    Musculoskeletal: Negative for back pain. Skin: Negative for rash. Neurological: Negative for headaches   10-point ROS otherwise negative.  ____________________________________________   PHYSICAL EXAM:  VITAL SIGNS: ED Triage Vitals  Enc Vitals Group     BP 07/18/16 1237 (!) 160/71     Pulse Rate 07/18/16 1237 94     Resp 07/18/16 1237 (!) 26     Temp 07/18/16 1237 97.9 F (36.6 C)     Temp Source 07/18/16 1237 Oral     SpO2 07/18/16 1237 93 %     Weight 07/18/16 1237 83 lb (37.6 kg)     Height 07/18/16 1237 5\' 1"  (1.549 m)     Head Circumference --      Peak Flow --      Pain Score 07/18/16 1238 9     Pain Loc --      Pain Edu? --      Excl. in [place]? --     Constitutional: Alert and oriented. . Pleasant and interactive Eyes: Conjunctivae are normal.   Nose: No congestion/rhinnorhea.   Cardiovascular:  Normal rate, regular rhythm. Grossly normal heart sounds.  Good peripheral circulation. Respiratory: Increased work of breathing. Scattered wheezes Gastrointestinal: Soft and nontender. No distention.  No CVA tenderness. Genitourinary: deferred Musculoskeletal: No lower extremity tenderness nor edema.  Warm and well perfused Neurologic:  Normal speech and language. No gross focal neurologic deficits are appreciated.  Skin:  Skin is warm, dry and intact. No rash noted. Psychiatric: Mood and affect are normal. Speech and behavior are normal.  ____________________________________________   LABS (all labs ordered  are listed, but only abnormal results are displayed)  Labs Reviewed  BASIC METABOLIC PANEL - Abnormal; Notable for the following:       Result Value   Sodium 129 (*)    Chloride 95 (*)    Glucose, Bld 107 (*)    Calcium 8.8 (*)    All other components within normal limits  CULTURE, BLOOD (ROUTINE X 2)  CULTURE, BLOOD (ROUTINE X 2)  CBC  TROPONIN I   ____________________________________________  EKG  ED ECG REPORT I, Lavonia Drafts, the attending physician, personally viewed and interpreted this ECG.  Date: 07/18/2016 EKG Time: 12:42 PM Rate: 93 Rhythm: normal sinus rhythm QRS Axis: normal Intervals: normal ST/T Wave abnormalities: normal Conduction Disturbances: none   ____________________________________________  RADIOLOGY  Chest x-ray consistent with COPD and possible early infiltrate ____________________________________________   PROCEDURES  Procedure(s) performed: No    Critical Care performed: No ____________________________________________   INITIAL IMPRESSION / ASSESSMENT AND PLAN / ED COURSE  Pertinent labs & imaging results that were available during my care of the patient were reviewed by me and considered in my medical decision making (see chart for details).  Patient presents with increased work of breathing and tachypnea. She does have cough and wheezing. Chest x-ray is suspicious for pneumonia. We'll treat with IV antibiotics DuoNeb and steroids, she may require hospitalization if no significant improvement.  Clinical Course    ____________________________________________   FINAL CLINICAL IMPRESSION(S) / ED DIAGNOSES  Final diagnoses:  COPD exacerbation ([place])  Community acquired pneumonia of left lower lobe of lung ([place])      NEW MEDICATIONS STARTED DURING THIS VISIT:  New Prescriptions   No medications on file     Note:  This document was prepared using Dragon voice recognition software and may include unintentional  dictation errors.    Lavonia Drafts, MD 07/18/16 [phone]

## 2016-07-18 NOTE — H&P (Signed)
[facility] [place] NAME: Preksha Tempel    MR#:  [id number]  DATE OF BIRTH:  1941/04/05  DATE OF ADMISSION:  07/18/2016  PRIMARY CARE PHYSICIAN: Ronnell Freshwater, NP   REQUESTING/REFERRING PHYSICIAN: kinner  CHIEF COMPLAINT:   Chief Complaint  Patient presents with  . Shortness of Breath    HISTORY OF PRESENT ILLNESS: Lititia Posa  is a 76 y.o. female with a known history of COPD, abdominal aortic aneurysm- was admitted with pneumonia a few months ago. She had her grandson visiting who had some viral sickness and for last 4 days patient started having cough, some sputum production, fever and chills with chest pain due to coughing. In ER she was noted to have COPD exacerbation and some finding of pneumonia on chest x-ray shows she is given his admission.  PAST MEDICAL HISTORY:   Past Medical History:  Diagnosis Date  . AAA (abdominal aortic aneurysm) ([place])   . COPD (chronic obstructive pulmonary disease) ([place])     PAST SURGICAL HISTORY: Past Surgical History:  Procedure Laterality Date  . ABDOMINAL HYSTERECTOMY      SOCIAL HISTORY:  Social History  Substance Use Topics  . Smoking status: Former Smoker    Packs/day: 0.25  . Smokeless tobacco: Never Used  . Alcohol use No    FAMILY HISTORY:  Family History  Problem Relation Age of Onset  . Breast cancer Mother     DRUG ALLERGIES:  Allergies  Allergen Reactions  . Aspirin Tinitus  . Sulfa Antibiotics Nausea And Vomiting  . Symbicort [Budesonide-Formoterol Fumarate] Other (See Comments)    Patient felt "out of her mind" and angry.    REVIEW OF SYSTEMS:   CONSTITUTIONAL: Positive for fever, fatigue or weakness.  EYES: No blurred or double vision.  EARS, NOSE, AND THROAT: No tinnitus or ear pain.  RESPIRATORY: She have cough, shortness of breath, wheezing,, no hemoptysis.  CARDIOVASCULAR: No chest pain, orthopnea, edema.  GASTROINTESTINAL: No nausea, vomiting,  diarrhea or abdominal pain.  GENITOURINARY: No dysuria, hematuria.  ENDOCRINE: No polyuria, nocturia,  HEMATOLOGY: No anemia, easy bruising or bleeding SKIN: No rash or lesion. MUSCULOSKELETAL: No joint pain or arthritis.   NEUROLOGIC: No tingling, numbness, weakness.  PSYCHIATRY: No anxiety or depression.   MEDICATIONS AT HOME:  Prior to Admission medications   Medication Sig Start Date End Date Taking? Authorizing Provider  albuterol (PROVENTIL HFA;VENTOLIN HFA) 108 (90 Base) MCG/ACT inhaler Inhale 2 puffs into the lungs every 6 (six) hours as needed for wheezing or shortness of breath.   Yes Historical Provider, MD  albuterol (PROVENTIL) (2.5 MG/3ML) 0.083% nebulizer solution Take 3 mLs (2.5 mg total) by nebulization every 6 (six) hours as needed for wheezing or shortness of breath. 04/29/16  Yes Vaughan Basta, MD  cetirizine (ZYRTEC) 10 MG tablet Take 1 tablet by mouth daily.   Yes Historical Provider, MD  Omega-3 Fatty Acids (FISH OIL PO) Take 1 capsule by mouth daily.   Yes Historical Provider, MD      PHYSICAL EXAMINATION:   VITAL SIGNS: Blood pressure 133/86, pulse 93, temperature 97.9 F (36.6 C), temperature source Oral, resp. rate 20, height 5\' 1"  (1.549 m), weight 37.6 kg (83 lb), SpO2 95 %.  GENERAL:  76 y.o.-year-old Thin patient lying in the bed with no acute distress.  EYES: Pupils equal, round, reactive to light and accommodation. No scleral icterus. Extraocular muscles intact.  HEENT: Head atraumatic, normocephalic. Oropharynx and nasopharynx clear.  NECK:  Supple, no jugular venous distention. No thyroid enlargement, no tenderness.  LUNGS: Normal breath sounds bilaterally, Some wheezing, some crepitation. No use of accessory muscles of respiration.  CARDIOVASCULAR: S1, S2 normal. No murmurs, rubs, or gallops.  ABDOMEN: Soft, nontender, nondistended. Bowel sounds present. No organomegaly or mass.  EXTREMITIES: No pedal edema, cyanosis, or clubbing.   NEUROLOGIC: Cranial nerves II through XII are intact. Muscle strength 5/5 in all extremities. Sensation intact. Gait not checked.  PSYCHIATRIC: The patient is alert and oriented x 3.  SKIN: No obvious rash, lesion, or ulcer.   LABORATORY PANEL:   CBC  Recent Labs Lab 07/18/16 1242  WBC 10.3  HGB 12.9  HCT 37.1  PLT 165  MCV 89.8  MCH 31.2  MCHC 34.8  RDW 13.5   ------------------------------------------------------------------------------------------------------------------  Chemistries   Recent Labs Lab 07/18/16 1242  NA 129*  K 3.8  CL 95*  CO2 27  GLUCOSE 107*  BUN 16  CREATININE 0.60  CALCIUM 8.8*   ------------------------------------------------------------------------------------------------------------------ estimated creatinine clearance is 36.6 mL/min (by C-G formula based on SCr of 0.6 mg/dL). ------------------------------------------------------------------------------------------------------------------ No results for input(s): TSH, T4TOTAL, T3FREE, THYROIDAB in the last 72 hours.  Invalid input(s): FREET3   Coagulation profile No results for input(s): INR, PROTIME in the last 168 hours. ------------------------------------------------------------------------------------------------------------------- No results for input(s): DDIMER in the last 72 hours. -------------------------------------------------------------------------------------------------------------------  Cardiac Enzymes  Recent Labs Lab 07/18/16 1242  TROPONINI <0.03   ------------------------------------------------------------------------------------------------------------------ Invalid input(s): POCBNP  ---------------------------------------------------------------------------------------------------------------  Urinalysis    Component Value Date/Time   COLORURINE Colorless 04/28/2014 1649   APPEARANCEUR Clear 04/28/2014 1649   LABSPEC 1.002 04/28/2014 1649    PHURINE 7.0 04/28/2014 1649   GLUCOSEU Negative 04/28/2014 1649   HGBUR 2+ 04/28/2014 1649   BILIRUBINUR Negative 04/28/2014 1649   KETONESUR Negative 04/28/2014 1649   PROTEINUR Negative 04/28/2014 1649   NITRITE Negative 04/28/2014 1649   LEUKOCYTESUR Negative 04/28/2014 1649     RADIOLOGY: Dg Chest 2 View  Result Date: 07/18/2016 CLINICAL DATA:  Shortness of breath, history of COPD and recent episode of pneumonia. Patient is currently afebrile but has decreased lung sounds. Former smoker. EXAM: CHEST  2 VIEW COMPARISON:  PA and lateral chest x-ray of May 05, 2016 FINDINGS: The lungs remain hyperinflated. There is subtle increased density in the retrosternal region likely on the left. The heart and pulmonary vascularity are normal. There is calcification in the wall of the aortic arch. There is no pleural effusion. There is gentle dextrocurvature centered at the thoracolumbar junction. IMPRESSION: COPD. Subsegmental atelectasis or early infiltrate likely in the lingula. No CHF. Followup PA and lateral chest X-ray is recommended in 3-4 weeks following trial of antibiotic therapy to ensure resolution. Thoracic aortic atherosclerosis. Electronically Signed   By: David  Martinique M.D.   On: 07/18/2016 13:51    EKG: Orders placed or performed during the hospital encounter of 07/18/16  . ED EKG  . ED EKG  . EKG 12-Lead  . EKG 12-Lead    IMPRESSION AND PLAN:  * Acute COPD exacerbation   Community-acquired pneumonia    IV steroid, antibiotics, nebulizer bronchodilator, Spiriva.   Supplemental oxygen as required and try to taper.   Incentive spirometry.  * Hyperlipidemia   Continue home medication.     All the records are reviewed and case discussed with ED provider. Management plans discussed with the patient, family and they are in agreement.  CODE STATUS:Full code  Code Status History    Date Active Date Inactive Code Status Order  ID Comments User Context   04/28/2016  1:56  AM 04/29/2016  1:28 PM Full Code [id number]  Harvie Bridge, DO Inpatient   11/25/2015  7:33 PM 11/26/2015  3:19 PM Full Code [id number]  Nicholes Mango, MD Inpatient     Patient's daughter who is also has the power of attorney was present in the room during my visit.  TOTAL TIME TAKING CARE OF THIS PATIENT: 50  minutes.    Vaughan Basta M.D on 07/18/2016   Between 7am to 6pm - Pager - [phone]  After 6pm go to [email or web address] - password [email or web address] [facility]  Office  [phone]  CC: Primary care physician; Ronnell Freshwater, NP   Note: This dictation was prepared with Dragon dictation along with smaller phrase technology. Any transcriptional errors that result from this process are unintentional.

## 2016-07-18 NOTE — ED Notes (Signed)
Placed on 2L/Fifty-Six.  Patient AAOx3,  Anxious.  Dr. Corky Downs aware and ativan ordered and administered as per [facility].   Emotional support given to patient and family with moderate effect.  Lights dimmed, resting encouraged. Continue to monitor.

## 2016-07-18 NOTE — ED Notes (Signed)
Patient refuses to have second IV draw stick.  Only consents to blood being drawn off of existing IV.

## 2016-07-19 LAB — BASIC METABOLIC PANEL
Anion gap: 8 (ref 5–15)
BUN: 20 mg/dL (ref 6–20)
CO2: 26 mmol/L (ref 22–32)
Calcium: 9 mg/dL (ref 8.9–10.3)
Chloride: 97 mmol/L — ABNORMAL LOW (ref 101–111)
Creatinine, Ser: 0.35 mg/dL — ABNORMAL LOW (ref 0.44–1.00)
GFR calc Af Amer: >60 mL/min (ref >60)
GLUCOSE: 149 mg/dL — AB (ref 65–99)
POTASSIUM: 3.4 mmol/L — AB (ref 3.5–5.1)
Sodium: 131 mmol/L — ABNORMAL LOW (ref 135–145)

## 2016-07-19 LAB — CBC
HEMATOCRIT: 35.1 % (ref 35.0–47.0)
Hemoglobin: 12.2 g/dL (ref 12.0–16.0)
MCH: 30.9 pg (ref 26.0–34.0)
MCHC: 34.8 g/dL (ref 32.0–36.0)
MCV: 88.8 fL (ref 80.0–100.0)
Platelets: 182 10*3/uL (ref 150–440)
RBC: 3.95 MIL/uL (ref 3.80–5.20)
RDW: 13.5 % (ref 11.5–14.5)
WBC: 5.6 10*3/uL (ref 3.6–11.0)

## 2016-07-19 MED ORDER — METHYLPREDNISOLONE SODIUM SUCC 125 MG IJ SOLR
60.0000 mg | Freq: Two times a day (BID) | INTRAMUSCULAR | Status: DC
  Administered 2016-07-19 – 2016-07-20 (×2): 60 mg via INTRAVENOUS
  Filled 2016-07-19 (×2): qty 2

## 2016-07-19 MED ORDER — ENOXAPARIN SODIUM 30 MG/0.3ML ~~LOC~~ SOLN: 30.0000 mg | SUBCUTANEOUS | Status: DC

## 2016-07-19 MED ORDER — BENZONATATE 100 MG PO CAPS
100.0000 mg | ORAL_CAPSULE | Freq: Three times a day (TID) | ORAL | Status: DC
  Administered 2016-07-19 – 2016-07-20 (×4): 100 mg via ORAL
  Filled 2016-07-19 (×4): qty 1

## 2016-07-19 MED ORDER — GUAIFENESIN ER 600 MG PO TB12
600.0000 mg | ORAL_TABLET | Freq: Two times a day (BID) | ORAL | Status: DC
  Administered 2016-07-19 (×2): 600 mg via ORAL
  Filled 2016-07-19 (×3): qty 1

## 2016-07-19 MED ORDER — IPRATROPIUM-ALBUTEROL 0.5-2.5 (3) MG/3ML IN SOLN
3.0000 mL | Freq: Four times a day (QID) | RESPIRATORY_TRACT | Status: DC
  Administered 2016-07-19 – 2016-07-20 (×4): 3 mL via RESPIRATORY_TRACT
  Filled 2016-07-19 (×4): qty 3

## 2016-07-19 MED ORDER — POTASSIUM CHLORIDE CRYS ER 20 MEQ PO TBCR
40.0000 meq | EXTENDED_RELEASE_TABLET | Freq: Once | ORAL | Status: AC
  Administered 2016-07-19: 40 meq via ORAL
  Filled 2016-07-19: qty 2

## 2016-07-19 NOTE — Plan of Care (Signed)
Problem: Education: Goal: Knowledge of [facility] Education information/materials will improve Outcome: Progressing No complaints overnight.  Call bell within reach, WCTM.   

## 2016-07-19 NOTE — Progress Notes (Signed)
Initial Nutrition Assessment  DOCUMENTATION CODES:   Severe malnutrition in context of chronic illness  INTERVENTION:  1. Vanilla yogurt between meals.  NUTRITION DIAGNOSIS:   Malnutrition related to chronic illness as evidenced by severe depletion of muscle mass, severe depletion of body fat.  GOAL:   Patient will meet greater than or equal to 90% of their needs  MONITOR:   PO intake, I & O's, Labs, Weight trends, Supplement acceptance  REASON FOR ASSESSMENT:   Malnutrition Screening Tool    ASSESSMENT:   Christina Hester  is a 76 y.o. female with a known history of COPD, abdominal aortic aneurysm- was admitted with pneumonia a few months ago. She had her grandson visiting who had some viral sickness and for last 4 days patient started having cough, some sputum production, fever and chills with chest pain due to coughing.  Spoke with patient at bedside. She reports good appetite, normally eating 3 meals per day along with 2 snacks she usually shares with her great grandchildren she takes care of. Meals normally consist of things like chicken nuggets or chicken and vegetables.. She reports weight gain since she has quit smoking - per chart - has gained 9# over the past 8 months. Had an omelet and bread this morning for breakfast - ate 100% Does not like ONS.  Admits to some nausea, no vomiting Denies any issues chewing or swallowing.  Nutrition-Focused physical exam completed. Findings are severe fat depletion, severe muscle depletion, and no edema.   Labs and medications reviewed: Na 131, K 3.4 Solumedrol, Omega-3s  Diet Order:  Diet Heart Room service appropriate? Yes; Fluid consistency: Thin  Skin:  Reviewed, no issues  Last BM:  07/19/2015  Height:   Ht Readings from Last 1 Encounters:  07/18/16 5\' 1"  (1.549 m)    Weight:   Wt Readings from Last 1 Encounters:  07/18/16 83 lb (37.6 kg)    Ideal Body Weight:  47.72 kg  BMI:  Body mass index is 15.68  kg/m.  Estimated Nutritional Needs:   Kcal:  [phone] calories  Protein:  38-45 gm  Fluid:  >/= 1L  EDUCATION NEEDS:   No education needs identified at this time  [name] Emmajane Altamura, MS, RD LDN Inpatient Clinical Dietitian Pager [phone]

## 2016-07-19 NOTE — Evaluation (Signed)
Physical Therapy Evaluation Patient Details Name: Christina Hester MRN: [id number] DOB: Dec 28, 1940 Today's Date: 07/19/2016   History of Present Illness  Patient admitted with shortness of breath, found to have COPD exacerbation and some findings of pneumonia.   Clinical Impression  Patient admitted with shortness of breath, had initially been quite low with O2 sats and placed on 2L of O2 on admission. When PT entered the room patient had O2 off, O2 sats were 100% on room air. Patient is independent with bed mobility and transfers, has some higher level balance which she deems secondary to decreased activity level over the last few days. After 2 laps around RN station her O2 sats were 91% on room air, but recovered very quickly with rest. No real acute PT needs noted as this is essentially her baseline level of function. PT will sign off.     Follow Up Recommendations No PT follow up (May be a candidate for outpatient OT)    Equipment Recommendations       Recommendations for Other Services       Precautions / Restrictions Precautions Precautions: None Restrictions Weight Bearing Restrictions: No      Mobility  Bed Mobility Overal bed mobility: Independent             General bed mobility comments: Patient is able to get in and out of bed without assistance.   Transfers Overall transfer level: Independent               General transfer comment: Patient transfers sit to stand independently, no loss of balance, appropriate speed.   Ambulation/Gait Ambulation/Gait assistance: Supervision Ambulation Distance (Feet): 375 Feet Assistive device: None Gait Pattern/deviations: WFL(Within Functional Limits);Narrow base of support   Gait velocity interpretation: at or above normal speed for age/gender General Gait Details: Patient has slight lateral sway with ambulation, likely a result of narrow BOS, no loss of balance. She reports she feels a touch unsteady, but only due  to decreased activity level over the past few days.   Stairs            Wheelchair Mobility    Modified Rankin (Stroke Patients Only)       Balance Overall balance assessment: Needs assistance Sitting-balance support: No upper extremity supported;Feet unsupported Sitting balance-Leahy Scale: Normal     Standing balance support: No upper extremity supported Standing balance-Leahy Scale: Good                               Pertinent Vitals/Pain Pain Assessment: No/denies pain    Home Living Family/patient expects to be discharged to:: Private residence Living Arrangements: Children Available Help at Discharge: Family;Available PRN/intermittently Type of Home: House Home Access: Stairs to enter Entrance Stairs-Rails: Left Entrance Stairs-Number of Steps: 4 Home Layout: One level Home Equipment: Walker - 4 wheels      Prior Function Level of Independence: Independent         Comments: Takes care of her grandson while her daughter is at work during the day.  Does not use AD.  Independent PTA.     Hand Dominance   Dominant Hand: Right    Extremity/Trunk Assessment   Upper Extremity Assessment Upper Extremity Assessment: Overall WFL for tasks assessed    Lower Extremity Assessment Lower Extremity Assessment: Overall WFL for tasks assessed       Communication   Communication: No difficulties  Cognition Arousal/Alertness: Awake/alert Behavior During Therapy: [facility]  for tasks assessed/performed Overall Cognitive Status: Within Functional Limits for tasks assessed                      General Comments      Exercises     Assessment/Plan    PT Assessment Patent does not need any further PT services  PT Problem List            PT Treatment Interventions      PT Goals (Current goals can be found in the Care Plan section)  Acute Rehab PT Goals Patient Stated Goal: To return home  PT Goal Formulation: With patient Time For  Goal Achievement: 08/02/16 Potential to Achieve Goals: Good    Frequency     Barriers to discharge        Co-evaluation               End of Session Equipment Utilized During Treatment: Gait belt Activity Tolerance: Patient tolerated treatment well Patient left: in bed;with call bell/phone within reach;with family/visitor present (Bed alarm off when PT entered room) Nurse Communication: Mobility status         Time: [id number] PT Time Calculation (min) (ACUTE ONLY): 10 min   Charges:   PT Evaluation $PT Eval Low Complexity: 1 Procedure     PT G Codes:       Kerman Passey, PT, DPT    07/19/2016, 12:00 PM

## 2016-07-19 NOTE — Progress Notes (Signed)
[place] at [place] NAME: Christina Hester    MR#:  [id number]  DATE OF BIRTH:  1941/06/27  SUBJECTIVE:  CHIEF COMPLAINT:   Chief Complaint  Patient presents with  . Shortness of Breath   - known COPD, not on home o2 - still has Cough and shortness of breath  REVIEW OF SYSTEMS:  Review of Systems  Constitutional: Positive for malaise/fatigue. Negative for chills and fever.  HENT: Negative for ear pain, hearing loss and nosebleeds.   Respiratory: Positive for cough, shortness of breath and wheezing.   Cardiovascular: Negative for chest pain and palpitations.  Gastrointestinal: Negative for abdominal pain, constipation, diarrhea, nausea and vomiting.  Genitourinary: Negative for dysuria.  Musculoskeletal: Negative for myalgias.  Neurological: Negative for dizziness, speech change, focal weakness, seizures and headaches.  Psychiatric/Behavioral: Negative for depression.    DRUG ALLERGIES:   Allergies  Allergen Reactions  . Aspirin Tinitus  . Sulfa Antibiotics Nausea And Vomiting  . Symbicort [Budesonide-Formoterol Fumarate] Other (See Comments)    Patient felt "out of her mind" and angry.    VITALS:  Blood pressure 101/69, pulse 69, temperature 97.7 F (36.5 C), temperature source Oral, resp. rate 18, height 5\' 1"  (1.549 m), weight 37.6 kg (83 lb), SpO2 100 %.  PHYSICAL EXAMINATION:  Physical Exam  GENERAL:  76 y.o.-year-old Thin built patient lying in the bed with no acute distress.  EYES: Pupils equal, round, reactive to light and accommodation. No scleral icterus. Extraocular muscles intact.  HEENT: Head atraumatic, normocephalic. Oropharynx and nasopharynx clear.  NECK:  Supple, no jugular venous distention. No thyroid enlargement, no tenderness.  LUNGS: Scant breath sounds bilaterally, no rales,rhonchi or crepitation. Scattered wheezing noted. No use of accessory muscles of respiration.  CARDIOVASCULAR: S1, S2 normal. No  murmurs, rubs, or gallops.  ABDOMEN: Soft, nontender, nondistended. Bowel sounds present. No organomegaly or mass.  EXTREMITIES: No pedal edema, cyanosis, or clubbing.  NEUROLOGIC: Cranial nerves II through XII are intact. Muscle strength 5/5 in all extremities. Sensation intact. Gait not checked.  PSYCHIATRIC: The patient is alert and oriented x 3.  SKIN: No obvious rash, lesion, or ulcer.    LABORATORY PANEL:   CBC  Recent Labs Lab 07/19/16 0553  WBC 5.6  HGB 12.2  HCT 35.1  PLT 182   ------------------------------------------------------------------------------------------------------------------  Chemistries   Recent Labs Lab 07/19/16 0553  NA 131*  K 3.4*  CL 97*  CO2 26  GLUCOSE 149*  BUN 20  CREATININE 0.35*  CALCIUM 9.0   ------------------------------------------------------------------------------------------------------------------  Cardiac Enzymes  Recent Labs Lab 07/18/16 1242  TROPONINI <0.03   ------------------------------------------------------------------------------------------------------------------  RADIOLOGY:  Dg Chest 2 View  Result Date: 07/18/2016 CLINICAL DATA:  Shortness of breath, history of COPD and recent episode of pneumonia. Patient is currently afebrile but has decreased lung sounds. Former smoker. EXAM: CHEST  2 VIEW COMPARISON:  PA and lateral chest x-ray of May 05, 2016 FINDINGS: The lungs remain hyperinflated. There is subtle increased density in the retrosternal region likely on the left. The heart and pulmonary vascularity are normal. There is calcification in the wall of the aortic arch. There is no pleural effusion. There is gentle dextrocurvature centered at the thoracolumbar junction. IMPRESSION: COPD. Subsegmental atelectasis or early infiltrate likely in the lingula. No CHF. Followup PA and lateral chest X-ray is recommended in 3-4 weeks following trial of antibiotic therapy to ensure resolution. Thoracic aortic  atherosclerosis. Electronically Signed   By: David  Martinique M.D.  On: 07/18/2016 13:51    EKG:   Orders placed or performed during the hospital encounter of 07/18/16  . ED EKG  . ED EKG  . EKG 12-Lead  . EKG 12-Lead    ASSESSMENT AND PLAN:   76 year old female with past medical history significant for COPD not on home oxygen, abdominal aortic aneurysm presents to hospital secondary to worsening breathing and cough.  #1 acute on chronic COPD exacerbation with bronchitis-still needing 2 L oxygen. Wean as tolerated. -Decreased the dose of IV steroids today. Continue duo nebs and inhalers. -Continue Levaquin for bronchitis. -Added cough medications. -Influenza test is negative  #2 hypokalemia-being replaced  #3 DVT Prophylaxis- lovenox   All the records are reviewed and case discussed with Care Management/Social Workerr. Management plans discussed with the patient, family and they are in agreement.  CODE STATUS: Full Code  TOTAL TIME TAKING CARE OF THIS PATIENT: 38 minutes.   POSSIBLE D/C IN 1-2 DAYS, DEPENDING ON CLINICAL CONDITION.   Gladstone Lighter M.D on 07/19/2016 at 9:01 AM  Between 7am to 6pm - Pager - [phone]  After 6pm go to [email or web address] - password [email or web address] [place]  Office  [phone]  CC: Primary care physician; Ronnell Freshwater, NP

## 2016-07-20 LAB — C DIFFICILE QUICK SCREEN W PCR REFLEX
C DIFFICILE (CDIFF) INTERP: NOT DETECTED
C DIFFICILE (CDIFF) TOXIN: NEGATIVE
C Diff antigen: NEGATIVE

## 2016-07-20 MED ORDER — PREDNISONE 10 MG (48) PO TBPK: ORAL_TABLET | Freq: Every day | ORAL | 0 refills | Status: DC

## 2016-07-20 MED ORDER — LEVOFLOXACIN 250 MG PO TABS: 250.0000 mg | ORAL_TABLET | Freq: Every day | ORAL | 0 refills | Status: DC

## 2016-07-20 MED ORDER — IPRATROPIUM-ALBUTEROL 0.5-2.5 (3) MG/3ML IN SOLN: 3.0000 mL | Freq: Four times a day (QID) | RESPIRATORY_TRACT | 0 refills | Status: DC | PRN

## 2016-07-20 MED ORDER — ALUM & MAG HYDROXIDE-SIMETH 200-200-20 MG/5ML PO SUSP
15.0000 mL | ORAL | Status: DC | PRN
  Filled 2016-07-20: qty 30

## 2016-07-20 MED ORDER — RISAQUAD PO CAPS: 1.0000 | ORAL_CAPSULE | Freq: Two times a day (BID) | ORAL | 0 refills | Status: DC

## 2016-07-20 MED ORDER — RISAQUAD PO CAPS
1.0000 | ORAL_CAPSULE | Freq: Two times a day (BID) | ORAL | Status: DC
  Administered 2016-07-20: 14:00:00 1 via ORAL
  Filled 2016-07-20: qty 1

## 2016-07-20 MED ORDER — BENZONATATE 100 MG PO CAPS: 100.0000 mg | ORAL_CAPSULE | Freq: Three times a day (TID) | ORAL | 0 refills | Status: DC

## 2016-07-20 NOTE — Plan of Care (Signed)
Problem: Education: Goal: Knowledge of [facility] Education information/materials will improve Outcome: Progressing VSS, free of falls during shift.  SpO2 93-97% on RA.  Pt reported scalp pruritis, diaphoresis ~2300, reviewed new meds w/ pt.  Pt stated sx probably from mucinex, she had similar sx when taking sudafed.  Encouraged pt to discuss w/ MD this AM.  No other complaints overnight.  Call bell within reach, [place].

## 2016-07-20 NOTE — Progress Notes (Signed)
Pt's daughter present for discharge; pt discharged via wheelchair by nursing to the visitor's entrance

## 2016-07-20 NOTE — Progress Notes (Signed)
MD order received to discharge pt home today; verballyl reviewed AVS with pt, no questions voiced at this time; pt's discharge pending daughter's arrival with a warm car

## 2016-07-20 NOTE — Progress Notes (Signed)
[place] at [place] NAME: Christina Hester    MR#:  [id number]  DATE OF BIRTH:  [birth year]  SUBJECTIVE:  CHIEF COMPLAINT:   Chief Complaint  Patient presents with  . Shortness of Breath   - breathing is improved, off o2 - complains of diarrhea since morning.  REVIEW OF SYSTEMS:  Review of Systems  Constitutional: Positive for malaise/fatigue. Negative for chills and fever.  HENT: Negative for ear pain, hearing loss and nosebleeds.   Respiratory: Positive for cough, shortness of breath and wheezing.   Cardiovascular: Negative for chest pain and palpitations.  Gastrointestinal: Positive for diarrhea. Negative for abdominal pain, constipation, nausea and vomiting.  Genitourinary: Negative for dysuria.  Musculoskeletal: Negative for myalgias.  Neurological: Negative for dizziness, speech change, focal weakness, seizures and headaches.  Psychiatric/Behavioral: Negative for depression.    DRUG ALLERGIES:   Allergies  Allergen Reactions  . Aspirin Tinitus  . Sulfa Antibiotics Nausea And Vomiting  . Symbicort [Budesonide-Formoterol Fumarate] Other (See Comments)    Patient felt "out of her mind" and angry.    VITALS:  Blood pressure 138/60, pulse 86, temperature 97.6 F (36.4 C), temperature source Oral, resp. rate 18, height 5\' 1"  (1.549 m), weight 37.6 kg (83 lb), SpO2 93 %.  PHYSICAL EXAMINATION:  Physical Exam  GENERAL:  76 y.o.-year-old Thin built patient lying in the bed with no acute distress.  EYES: Pupils equal, round, reactive to light and accommodation. No scleral icterus. Extraocular muscles intact.  HEENT: Head atraumatic, normocephalic. Oropharynx and nasopharynx clear.  NECK:  Supple, no jugular venous distention. No thyroid enlargement, no tenderness.  LUNGS: Scant breath sounds bilaterally, no rales,rhonchi or crepitation. Scattered wheezing noted. No use of accessory muscles of respiration.  CARDIOVASCULAR: S1, S2  normal. No murmurs, rubs, or gallops.  ABDOMEN: Soft, nontender, nondistended. Bowel sounds present. No organomegaly or mass.  EXTREMITIES: No pedal edema, cyanosis, or clubbing.  NEUROLOGIC: Cranial nerves II through XII are intact. Muscle strength 5/5 in all extremities. Sensation intact. Gait not checked.  PSYCHIATRIC: The patient is alert and oriented x 3.  SKIN: No obvious rash, lesion, or ulcer.    LABORATORY PANEL:   CBC  Recent Labs Lab 07/19/16 0553  WBC 5.6  HGB 12.2  HCT 35.1  PLT 182   ------------------------------------------------------------------------------------------------------------------  Chemistries   Recent Labs Lab 07/19/16 0553  NA 131*  K 3.4*  CL 97*  CO2 26  GLUCOSE 149*  BUN 20  CREATININE 0.35*  CALCIUM 9.0   ------------------------------------------------------------------------------------------------------------------  Cardiac Enzymes  Recent Labs Lab 07/18/16 1242  TROPONINI <0.03   ------------------------------------------------------------------------------------------------------------------  RADIOLOGY:  No results found.  EKG:   Orders placed or performed during the hospital encounter of 07/18/16  . ED EKG  . ED EKG  . EKG 12-Lead  . EKG 12-Lead    ASSESSMENT AND PLAN:   76 year old female with past medical history significant for COPD not on home oxygen, abdominal aortic aneurysm presents to hospital secondary to worsening breathing and cough.  #1 acute on chronic COPD exacerbation with bronchitis- off oxygen today.  -on IV steroids - wean to oral steroids at discharge. Continue duo nebs and inhalers. -on Levaquin for bronchitis. -on cough medications. -Influenza test is negative  #2 hypokalemia-replaced  #3 Diarrhea- started today, check d.ciff, GI panel Started probiotics.  #4 DVT Prophylaxis- lovenox   Discharge today if diarrhea improves.   All the records are reviewed and case discussed  with Care Management/Social Workerr.  Management plans discussed with the patient, family and they are in agreement.  CODE STATUS: Full Code  TOTAL TIME TAKING CARE OF THIS PATIENT: 37 minutes.   POSSIBLE D/C TODAY OR TOMORROW, DEPENDING ON CLINICAL CONDITION.   Gladstone Lighter M.D on 07/20/2016 at 3:30 PM  Between 7am to 6pm - Pager - [phone]  After 6pm go to [email or web address] - password [email or web address] [facility]  Office  [phone]  CC: Primary care physician; Ronnell Freshwater, NP

## 2016-07-20 NOTE — Care Management Important Message (Signed)
Important Message  Patient Details  Name: Christina Hester MRN: [id number] Date of Birth: [birth year]   Medicare Important Message Given:  Yes    Shelbie Ammons, RN 07/20/2016, 9:19 AM

## 2016-07-23 LAB — CULTURE, BLOOD (ROUTINE X 2)
Culture: NO GROWTH
Culture: NO GROWTH

## 2016-07-28 DIAGNOSIS — J189 Pneumonia, unspecified organism: Secondary | ICD-10-CM | POA: Diagnosis not present

## 2016-07-28 DIAGNOSIS — J449 Chronic obstructive pulmonary disease, unspecified: Secondary | ICD-10-CM | POA: Diagnosis not present

## 2016-07-28 DIAGNOSIS — E876 Hypokalemia: Secondary | ICD-10-CM | POA: Diagnosis not present

## 2016-07-28 DIAGNOSIS — F411 Generalized anxiety disorder: Secondary | ICD-10-CM | POA: Diagnosis not present

## 2016-07-28 DIAGNOSIS — F17210 Nicotine dependence, cigarettes, uncomplicated: Secondary | ICD-10-CM | POA: Diagnosis not present

## 2016-07-28 NOTE — Discharge Summary (Signed)
[place] at [place] NAME: Christina Hester    MR#:  [id number]  [place]:  [birth year]  DATE OF ADMISSION:  07/18/2016   ADMITTING PHYSICIAN: Vaughan Basta, MD  DATE OF DISCHARGE: 07/20/2016  5:32 PM  PRIMARY CARE PHYSICIAN: Ronnell Freshwater, NP   ADMISSION DIAGNOSIS:   COPD exacerbation ([place]) [J44.1] Community acquired pneumonia of left lower lobe of lung ([place]) [J18.1]  DISCHARGE DIAGNOSIS:   Active Problems:   Community acquired pneumonia   Pneumonia   SECONDARY DIAGNOSIS:   Past Medical History:  Diagnosis Date  . AAA (abdominal aortic aneurysm) ([place])   . COPD (chronic obstructive pulmonary disease) [facility])     HOSPITAL COURSE:   76 year old female with past medical history significant for COPD not on home oxygen, abdominal aortic aneurysm presents to hospital secondary to worsening breathing and cough.  #1 acute on chronic COPD exacerbation with bronchitis- off oxygen now.  -on IV steroids - wean to oral steroids at discharge. Continue duo nebs and inhalers. -on Levaquin for bronchitis. -on cough medications. -Influenza test is negative  #2 hypokalemia-replaced  #3 Diarrhea- on the day of discharge, c.diff negative. Very minimal amount- more like fecal incontinence- advised to take imodium if worse. Started probiotics.  Stable and being discharged home.   DISCHARGE CONDITIONS:   Stable  CONSULTS OBTAINED:   None  DRUG ALLERGIES:   Allergies  Allergen Reactions  . Aspirin Tinitus  . Sulfa Antibiotics Nausea And Vomiting  . Symbicort [Budesonide-Formoterol Fumarate] Other (See Comments)    Patient felt "out of her mind" and angry.   DISCHARGE MEDICATIONS:   Allergies as of 07/20/2016      Reactions   Aspirin Tinitus   Sulfa Antibiotics Nausea And Vomiting   Symbicort [budesonide-formoterol Fumarate] Other (See Comments)   Patient felt "out of her mind" and angry.      Medication List      TAKE these medications   acidophilus Caps capsule Take 1 capsule by mouth 2 (two) times daily. While on antibiotics   albuterol 108 (90 Base) MCG/ACT inhaler Commonly known as:  PROVENTIL HFA;VENTOLIN HFA Inhale 2 puffs into the lungs every 6 (six) hours as needed for wheezing or shortness of breath. What changed:  Another medication with the same name was removed. Continue taking this medication, and follow the directions you see here.   benzonatate 100 MG capsule Commonly known as:  TESSALON Take 1 capsule (100 mg total) by mouth 3 (three) times daily.   cetirizine 10 MG tablet Commonly known as:  ZYRTEC Take 1 tablet by mouth daily.   FISH OIL PO Take 1 capsule by mouth daily.   ipratropium-albuterol 0.5-2.5 (3) MG/3ML Soln Commonly known as:  DUONEB Take 3 mLs by nebulization every 6 (six) hours as needed.   levofloxacin 250 MG tablet Commonly known as:  LEVAQUIN Take 1 tablet (250 mg total) by mouth daily. X 5 more days   predniSONE 10 MG (48) Tbpk tablet Commonly known as:  STERAPRED UNI-PAK 48 TAB Take by mouth daily. 6 tabs PO x 2 days 5 tabs PO x 2 days 4 tabs PO x 2 days 3 tabs PO x 2 days 2 tabs PO x 2 days 1 tab PO x 2 days and stop        DISCHARGE INSTRUCTIONS:   1. PCP f/u in 1 week  DIET:   Cardiac diet  ACTIVITY:   Activity as tolerated  OXYGEN:  Home Oxygen: No.  Oxygen Delivery: room air  DISCHARGE LOCATION:   home   If you experience worsening of your admission symptoms, develop shortness of breath, life threatening emergency, suicidal or homicidal thoughts you must seek medical attention immediately by calling 911 or calling your MD immediately  if symptoms less severe.  You Must read complete instructions/literature along with all the possible adverse reactions/side effects for all the Medicines you take and that have been prescribed to you. Take any new Medicines after you have completely understood and accpet all the possible  adverse reactions/side effects.   Please note  You were cared for by a hospitalist during your hospital stay. If you have any questions about your discharge medications or the care you received while you were in the hospital after you are discharged, you can call the unit and asked to speak with the hospitalist on call if the hospitalist that took care of you is not available. Once you are discharged, your primary care physician will handle any further medical issues. Please note that NO REFILLS for any discharge medications will be authorized once you are discharged, as it is imperative that you return to your primary care physician (or establish a relationship with a primary care physician if you do not have one) for your aftercare needs so that they can reassess your need for medications and monitor your lab values.    On the day of Discharge:  VITAL SIGNS:   Blood pressure 138/60, pulse 86, temperature 97.6 F (36.4 C), temperature source Oral, resp. rate 18, height 5\' 1"  (1.549 m), weight 37.6 kg (83 lb), SpO2 95 %.  PHYSICAL EXAMINATION:    GENERAL:  76 y.o.-year-old Thin built patient lying in the bed with no acute distress.  EYES: Pupils equal, round, reactive to light and accommodation. No scleral icterus. Extraocular muscles intact.  HEENT: Head atraumatic, normocephalic. Oropharynx and nasopharynx clear.  NECK:  Supple, no jugular venous distention. No thyroid enlargement, no tenderness.  LUNGS: Scant breath sounds bilaterally, no rales,rhonchi or crepitation. Scattered wheezing noted. No use of accessory muscles of respiration.  CARDIOVASCULAR: S1, S2 normal. No murmurs, rubs, or gallops.  ABDOMEN: Soft, nontender, nondistended. Bowel sounds present. No organomegaly or mass.  EXTREMITIES: No pedal edema, cyanosis, or clubbing.  NEUROLOGIC: Cranial nerves II through XII are intact. Muscle strength 5/5 in all extremities. Sensation intact. Gait not checked.  PSYCHIATRIC: The  patient is alert and oriented x 3.  SKIN: No obvious rash, lesion, or ulcer.    DATA REVIEW:   CBC No results for input(s): WBC, HGB, HCT, PLT in the last 168 hours.  Chemistries  No results for input(s): NA, K, CL, CO2, GLUCOSE, BUN, CREATININE, CALCIUM, MG, AST, ALT, ALKPHOS, BILITOT in the last 168 hours.  Invalid input(s): [place]   Microbiology Results  Results for orders placed or performed during the hospital encounter of 07/18/16  Blood culture (routine x 2)     Status: None   Collection Time: 07/18/16  2:28 PM  Result Value Ref Range Status   Specimen Description BLOOD RIGHT AC  Final   Special Requests   Final    BOTTLES DRAWN AEROBIC AND ANAEROBIC AER 12ML ANA 12ML   Culture NO GROWTH 5 DAYS  Final   Report Status 07/23/2016 FINAL  Final  Blood culture (routine x 2)     Status: None   Collection Time: 07/18/16  4:20 PM  Result Value Ref Range Status   Specimen Description BLOOD RIGHT AC  Final   Special Requests   Final    BOTTLES DRAWN AEROBIC AND ANAEROBIC AER 13ML ANA 13ML   Culture  Setup Time Organism ID to follow  Final   Culture NO GROWTH 5 DAYS  Final   Report Status 07/23/2016 FINAL  Final  Rapid Influenza A&B Antigens ([facility] only)     Status: None   Collection Time: 07/18/16  4:20 PM  Result Value Ref Range Status   Influenza A ([facility]) NEGATIVE NEGATIVE Final   Influenza B ([facility]) NEGATIVE NEGATIVE Final  C difficile quick scan w PCR reflex     Status: None   Collection Time: 07/20/16  3:06 PM  Result Value Ref Range Status   C Diff antigen NEGATIVE NEGATIVE Final   C Diff toxin NEGATIVE NEGATIVE Final   C Diff interpretation No C. difficile detected.  Final    RADIOLOGY:  No results found.   Management plans discussed with the patient, family and they are in agreement.  CODE STATUS:  Code Status History    Date Active Date Inactive Code Status Order ID Comments User Context   07/18/2016  6:24 PM 07/20/2016  8:41 PM Full Code [id number]   Vaughan Basta, MD ED   04/28/2016  1:56 AM 04/29/2016  1:28 PM Full Code [id number]  Harvie Bridge, DO Inpatient   11/25/2015  7:33 PM 11/26/2015  3:19 PM Full Code [id number]  Nicholes Mango, MD Inpatient      TOTAL TIME TAKING CARE OF THIS PATIENT: 37 minutes.    Gladstone Lighter M.D on 07/28/2016 at 4:06 PM  Between 7am to 6pm - Pager - [phone]  After 6pm go to [email or web address] - Proofreader  [facility]  Office  [phone]  CC: Primary care physician; Ronnell Freshwater, NP   Note: This dictation was prepared with Dragon dictation along with smaller phrase technology. Any transcriptional errors that result from this process are unintentional.

## 2016-08-08 ENCOUNTER — Other Ambulatory Visit: Payer: Self-pay | Admitting: Nurse Practitioner

## 2016-08-08 ENCOUNTER — Ambulatory Visit
Admission: RE | Admit: 2016-08-08 | Discharge: 2016-08-08 | Disposition: A | Discharge status: Home / Self Care | Payer: Medicare Other | Source: Ambulatory Visit | Attending: Nurse Practitioner | Admitting: Nurse Practitioner

## 2016-08-08 DIAGNOSIS — J189 Pneumonia, unspecified organism: Secondary | ICD-10-CM | POA: Diagnosis present

## 2016-08-08 DIAGNOSIS — J449 Chronic obstructive pulmonary disease, unspecified: Secondary | ICD-10-CM | POA: Diagnosis not present

## 2016-08-08 DIAGNOSIS — I700 Atherosclerosis of aorta: Secondary | ICD-10-CM | POA: Insufficient documentation

## 2016-08-08 DIAGNOSIS — Z8701 Personal history of pneumonia (recurrent): Secondary | ICD-10-CM | POA: Insufficient documentation

## 2016-08-24 DIAGNOSIS — F17210 Nicotine dependence, cigarettes, uncomplicated: Secondary | ICD-10-CM | POA: Diagnosis not present

## 2016-08-24 DIAGNOSIS — J189 Pneumonia, unspecified organism: Secondary | ICD-10-CM | POA: Diagnosis not present

## 2016-08-24 DIAGNOSIS — J449 Chronic obstructive pulmonary disease, unspecified: Secondary | ICD-10-CM | POA: Diagnosis not present

## 2016-08-24 DIAGNOSIS — R0602 Shortness of breath: Secondary | ICD-10-CM | POA: Diagnosis not present

## 2016-08-28 DIAGNOSIS — E876 Hypokalemia: Secondary | ICD-10-CM | POA: Diagnosis not present

## 2016-08-28 DIAGNOSIS — D509 Iron deficiency anemia, unspecified: Secondary | ICD-10-CM | POA: Diagnosis not present

## 2016-08-31 DIAGNOSIS — F17210 Nicotine dependence, cigarettes, uncomplicated: Secondary | ICD-10-CM | POA: Diagnosis not present

## 2016-08-31 DIAGNOSIS — J189 Pneumonia, unspecified organism: Secondary | ICD-10-CM | POA: Diagnosis not present

## 2016-08-31 DIAGNOSIS — R636 Underweight: Secondary | ICD-10-CM | POA: Diagnosis not present

## 2016-08-31 DIAGNOSIS — F411 Generalized anxiety disorder: Secondary | ICD-10-CM | POA: Diagnosis not present

## 2016-08-31 DIAGNOSIS — R0602 Shortness of breath: Secondary | ICD-10-CM | POA: Diagnosis not present

## 2016-08-31 DIAGNOSIS — R062 Wheezing: Secondary | ICD-10-CM | POA: Diagnosis not present

## 2016-08-31 DIAGNOSIS — J449 Chronic obstructive pulmonary disease, unspecified: Secondary | ICD-10-CM | POA: Diagnosis not present

## 2016-08-31 DIAGNOSIS — E876 Hypokalemia: Secondary | ICD-10-CM | POA: Diagnosis not present

## 2016-10-03 ENCOUNTER — Other Ambulatory Visit: Payer: Self-pay | Admitting: Internal Medicine

## 2016-10-03 DIAGNOSIS — F17210 Nicotine dependence, cigarettes, uncomplicated: Secondary | ICD-10-CM | POA: Diagnosis not present

## 2016-10-03 DIAGNOSIS — R636 Underweight: Secondary | ICD-10-CM | POA: Diagnosis not present

## 2016-10-03 DIAGNOSIS — Z1231 Encounter for screening mammogram for malignant neoplasm of breast: Secondary | ICD-10-CM

## 2016-10-03 DIAGNOSIS — R062 Wheezing: Secondary | ICD-10-CM | POA: Diagnosis not present

## 2016-10-03 DIAGNOSIS — J449 Chronic obstructive pulmonary disease, unspecified: Secondary | ICD-10-CM | POA: Diagnosis not present

## 2016-10-03 DIAGNOSIS — F411 Generalized anxiety disorder: Secondary | ICD-10-CM | POA: Diagnosis not present

## 2016-10-03 DIAGNOSIS — R030 Elevated blood-pressure reading, without diagnosis of hypertension: Secondary | ICD-10-CM | POA: Diagnosis not present

## 2016-10-13 DIAGNOSIS — Z1212 Encounter for screening for malignant neoplasm of rectum: Secondary | ICD-10-CM | POA: Diagnosis not present

## 2016-10-13 DIAGNOSIS — Z1211 Encounter for screening for malignant neoplasm of colon: Secondary | ICD-10-CM | POA: Diagnosis not present

## 2016-11-01 ENCOUNTER — Ambulatory Visit
Admission: RE | Admit: 2016-11-01 | Discharge: 2016-11-01 | Disposition: A | Discharge status: Home / Self Care | Payer: Medicare Other | Source: Ambulatory Visit | Attending: Internal Medicine | Admitting: Internal Medicine

## 2016-11-01 DIAGNOSIS — Z1231 Encounter for screening mammogram for malignant neoplasm of breast: Secondary | ICD-10-CM | POA: Diagnosis not present

## 2016-11-02 DIAGNOSIS — R062 Wheezing: Secondary | ICD-10-CM | POA: Diagnosis not present

## 2016-11-02 DIAGNOSIS — J449 Chronic obstructive pulmonary disease, unspecified: Secondary | ICD-10-CM | POA: Diagnosis not present

## 2016-11-02 DIAGNOSIS — B370 Candidal stomatitis: Secondary | ICD-10-CM | POA: Diagnosis not present

## 2016-12-20 DIAGNOSIS — J449 Chronic obstructive pulmonary disease, unspecified: Secondary | ICD-10-CM | POA: Diagnosis not present

## 2016-12-20 DIAGNOSIS — I10 Essential (primary) hypertension: Secondary | ICD-10-CM | POA: Diagnosis not present

## 2016-12-20 DIAGNOSIS — F17210 Nicotine dependence, cigarettes, uncomplicated: Secondary | ICD-10-CM | POA: Diagnosis not present

## 2016-12-20 DIAGNOSIS — F411 Generalized anxiety disorder: Secondary | ICD-10-CM | POA: Diagnosis not present

## 2017-01-01 DIAGNOSIS — D485 Neoplasm of uncertain behavior of skin: Secondary | ICD-10-CM | POA: Diagnosis not present

## 2017-01-01 DIAGNOSIS — L820 Inflamed seborrheic keratosis: Secondary | ICD-10-CM | POA: Diagnosis not present

## 2017-01-01 DIAGNOSIS — Z85828 Personal history of other malignant neoplasm of skin: Secondary | ICD-10-CM | POA: Diagnosis not present

## 2017-01-01 DIAGNOSIS — D2221 Melanocytic nevi of right ear and external auricular canal: Secondary | ICD-10-CM | POA: Diagnosis not present

## 2017-01-01 DIAGNOSIS — L720 Epidermal cyst: Secondary | ICD-10-CM | POA: Diagnosis not present

## 2017-01-01 DIAGNOSIS — L821 Other seborrheic keratosis: Secondary | ICD-10-CM | POA: Diagnosis not present

## 2017-01-12 ENCOUNTER — Encounter: Payer: Self-pay | Admitting: Emergency Medicine

## 2017-01-12 ENCOUNTER — Emergency Department: Payer: Medicare Other

## 2017-01-12 ENCOUNTER — Emergency Department
Admission: EM | Admit: 2017-01-12 | Discharge: 2017-01-12 | Disposition: A | Discharge status: Home / Self Care | Payer: Medicare Other | Attending: Emergency Medicine | Admitting: Emergency Medicine

## 2017-01-12 DIAGNOSIS — J189 Pneumonia, unspecified organism: Secondary | ICD-10-CM | POA: Insufficient documentation

## 2017-01-12 DIAGNOSIS — E871 Hypo-osmolality and hyponatremia: Secondary | ICD-10-CM | POA: Diagnosis not present

## 2017-01-12 DIAGNOSIS — R0602 Shortness of breath: Secondary | ICD-10-CM | POA: Diagnosis not present

## 2017-01-12 DIAGNOSIS — R091 Pleurisy: Secondary | ICD-10-CM | POA: Insufficient documentation

## 2017-01-12 DIAGNOSIS — I10 Essential (primary) hypertension: Secondary | ICD-10-CM | POA: Diagnosis not present

## 2017-01-12 DIAGNOSIS — R079 Chest pain, unspecified: Secondary | ICD-10-CM | POA: Diagnosis not present

## 2017-01-12 DIAGNOSIS — J449 Chronic obstructive pulmonary disease, unspecified: Secondary | ICD-10-CM | POA: Diagnosis not present

## 2017-01-12 DIAGNOSIS — Z79899 Other long term (current) drug therapy: Secondary | ICD-10-CM | POA: Diagnosis not present

## 2017-01-12 DIAGNOSIS — Z87891 Personal history of nicotine dependence: Secondary | ICD-10-CM | POA: Diagnosis not present

## 2017-01-12 HISTORY — DX: Essential (primary) hypertension: I10

## 2017-01-12 LAB — BASIC METABOLIC PANEL
ANION GAP: 10 (ref 5–15)
BUN: 12 mg/dL (ref 6–20)
CO2: 26 mmol/L (ref 22–32)
Calcium: 9.5 mg/dL (ref 8.9–10.3)
Chloride: 91 mmol/L — ABNORMAL LOW (ref 101–111)
Creatinine, Ser: 0.73 mg/dL (ref 0.44–1.00)
GFR calc Af Amer: >60 mL/min (ref >60)
Glucose, Bld: 110 mg/dL — ABNORMAL HIGH (ref 65–99)
POTASSIUM: 4.3 mmol/L (ref 3.5–5.1)
SODIUM: 127 mmol/L — AB (ref 135–145)

## 2017-01-12 LAB — FIBRIN DERIVATIVES D-DIMER (ARMC ONLY): FIBRIN DERIVATIVES D-DIMER (ARMC): 2048.66 — AB (ref 0.00–499.00)

## 2017-01-12 LAB — TROPONIN I

## 2017-01-12 LAB — CBC
HEMATOCRIT: 37.1 % (ref 35.0–47.0)
HEMOGLOBIN: 13.2 g/dL (ref 12.0–16.0)
MCH: 31.8 pg (ref 26.0–34.0)
MCHC: 35.5 g/dL (ref 32.0–36.0)
MCV: 89.6 fL (ref 80.0–100.0)
Platelets: 263 10*3/uL (ref 150–440)
RBC: 4.14 MIL/uL (ref 3.80–5.20)
RDW: 13.1 % (ref 11.5–14.5)
WBC: 12 10*3/uL — AB (ref 3.6–11.0)

## 2017-01-12 MED ORDER — IOPAMIDOL (ISOVUE-370) INJECTION 76%
75.0000 mL | Freq: Once | INTRAVENOUS | Status: AC | PRN
  Administered 2017-01-12: 75 mL via INTRAVENOUS

## 2017-01-12 MED ORDER — SODIUM CHLORIDE 0.9 % IV BOLUS (SEPSIS)
500.0000 mL | Freq: Once | INTRAVENOUS | Status: AC
  Administered 2017-01-12: 500 mL via INTRAVENOUS

## 2017-01-12 MED ORDER — AZITHROMYCIN 250 MG PO TABS: ORAL_TABLET | ORAL | 0 refills | Status: DC

## 2017-01-12 MED ORDER — AZITHROMYCIN 500 MG PO TABS
500.0000 mg | ORAL_TABLET | Freq: Once | ORAL | Status: AC
  Administered 2017-01-12: 500 mg via ORAL
  Filled 2017-01-12: qty 1

## 2017-01-12 NOTE — ED Provider Notes (Signed)
[facility] Emergency Department Provider Note  ____________________________________________  Time seen: Approximately 10:16 PM  I have reviewed the triage vital signs and the nursing notes.   HISTORY  Chief Complaint Chest Pain    HPI Christina Hester is a 76 y.o. female who complains of left-sided chest pain that is sharp and pleuritic that started yesterday. Gradual onset but progressively severe. Nonradiating. No cough or fever. Never had anything like this before. Denies any recent travel trauma hospitalization or surgery. No history of DVT or PE. No hormonal medications. No alleviating factors. Again worsened by deep breathing. Denies shortness of breath.     Past Medical History:  Diagnosis Date  . AAA (abdominal aortic aneurysm) ([place])   . COPD (chronic obstructive pulmonary disease) ([place])   . Hypertension      Patient Active Problem List   Diagnosis Date Noted  . Pneumonia 07/18/2016  . AAA (abdominal aortic aneurysm) without rupture ([place]) 05/17/2016  . PVD (peripheral vascular disease) ([place]) 05/17/2016  . Protein-calorie malnutrition, severe 04/29/2016  . Community acquired pneumonia 04/28/2016  . Chest pain 11/25/2015     Past Surgical History:  Procedure Laterality Date  . ABDOMINAL HYSTERECTOMY    . BREAST EXCISIONAL BIOPSY Left 20+ YRS AGO   NEG     Prior to Admission medications   Medication Sig Start Date End Date Taking? Authorizing Provider  cetirizine (ZYRTEC) 10 MG tablet Take 1 tablet by mouth daily.   Yes [provider]  lisinopril (PRINIVIL,ZESTRIL) 2.5 MG tablet Take 2.5 mg by mouth daily.   Yes [provider]  Omega-3 Fatty Acids (FISH OIL PO) Take 1 capsule by mouth daily.   Yes [provider]  tiotropium (SPIRIVA) 18 MCG inhalation capsule Place 18 mcg into inhaler and inhale daily.   Yes [provider]  acidophilus (RISAQUAD) CAPS capsule Take 1 capsule by mouth 2 (two)  times daily. While on antibiotics Patient not taking: Reported on 01/12/2017 07/20/16   Gladstone Lighter, MD  albuterol (PROVENTIL HFA;VENTOLIN HFA) 108 (90 Base) MCG/ACT inhaler Inhale 2 puffs into the lungs every 6 (six) hours as needed for wheezing or shortness of breath.    [provider]  azithromycin (ZITHROMAX Z-PAK) 250 MG tablet Take 2 tablets (500 mg) on  Day 1,  followed by 1 tablet (250 mg) once daily on Days 2 through 5. 01/12/17   Carrie Mew, MD  benzonatate (TESSALON) 100 MG capsule Take 1 capsule (100 mg total) by mouth 3 (three) times daily. Patient not taking: Reported on 01/12/2017 07/20/16   Gladstone Lighter, MD  ipratropium-albuterol (DUONEB) 0.5-2.5 (3) MG/3ML SOLN Take 3 mLs by nebulization every 6 (six) hours as needed. 07/20/16   Gladstone Lighter, MD  levofloxacin (LEVAQUIN) 250 MG tablet Take 1 tablet (250 mg total) by mouth daily. X 5 more days Patient not taking: Reported on 01/12/2017 07/20/16   Gladstone Lighter, MD  predniSONE (STERAPRED UNI-PAK 48 TAB) 10 MG (48) TBPK tablet Take by mouth daily. 6 tabs PO x 2 days 5 tabs PO x 2 days 4 tabs PO x 2 days 3 tabs PO x 2 days 2 tabs PO x 2 days 1 tab PO x 2 days and stop Patient not taking: Reported on 01/12/2017 07/20/16   Gladstone Lighter, MD     Allergies Aspirin; Sulfa antibiotics; and Symbicort [budesonide-formoterol fumarate]   Family History  Problem Relation Age of Onset  . Breast cancer Mother   . Breast cancer Maternal Aunt   .  Breast cancer Maternal Aunt   . Breast cancer Maternal Aunt     Social History Social History  Substance Use Topics  . Smoking status: Former Smoker    Packs/day: 0.25  . Smokeless tobacco: Never Used  . Alcohol use No    Review of Systems  Constitutional:   No fever or chills.  ENT:   No sore throat. No rhinorrhea. Cardiovascular:   Positive as above for chest pain. Respiratory:   No dyspnea or cough. Gastrointestinal:   Negative for abdominal pain,  vomiting and diarrhea.  Musculoskeletal:   Negative for focal pain or swelling All other systems reviewed and are negative except as documented above in ROS and HPI.  ____________________________________________   PHYSICAL EXAM:  VITAL SIGNS: ED Triage Vitals  Enc Vitals Group     BP 01/12/17 1519 (!) 200/78     Pulse Rate 01/12/17 1519 93     Resp 01/12/17 1519 16     Temp 01/12/17 1519 99.2 F (37.3 C)     Temp Source 01/12/17 1519 Oral     SpO2 01/12/17 1519 97 %     Weight --      Height --      Head Circumference --      Peak Flow --      Pain Score 01/12/17 1516 10     Pain Loc --      Pain Edu? --      Excl. in [place]? --     Vital signs reviewed, nursing assessments reviewed.   Constitutional:   Alert and oriented. Well appearing and in no distress. Eyes:   No scleral icterus.  EOMI. No nystagmus. No conjunctival pallor. PERRL. ENT   Head:   Normocephalic and atraumatic.   Nose:   No congestion/rhinnorhea.    Mouth/Throat:   MMM, no pharyngeal erythema. No peritonsillar mass.    Neck:   No meningismus. Full ROM Hematological/Lymphatic/Immunilogical:   No cervical lymphadenopathy. Cardiovascular:   RRR. Symmetric bilateral radial and DP pulses.  No murmurs.  Respiratory:   Normal respiratory effort without tachypnea/retractions. Breath sounds are clear and equal bilaterally. No wheezes/rales/rhonchi. Gastrointestinal:   Soft and nontender. Non distended. There is no CVA tenderness.  No rebound, rigidity, or guarding. Genitourinary:   deferred Musculoskeletal:   Normal range of motion in all extremities. No joint effusions.  No lower extremity tenderness.  No edema.Chest wall nontender Neurologic:   Normal speech and language.  Motor grossly intact. No gross focal neurologic deficits are appreciated.  Skin:    Skin is warm, dry and intact. No rash noted.  No petechiae, purpura, or bullae.  ____________________________________________    LABS  (pertinent positives/negatives) (all labs ordered are listed, but only abnormal results are displayed) Labs Reviewed  BASIC METABOLIC PANEL - Abnormal; Notable for the following:       Result Value   Sodium 127 (*)    Chloride 91 (*)    Glucose, Bld 110 (*)    All other components within normal limits  CBC - Abnormal; Notable for the following:    WBC 12.0 (*)    All other components within normal limits  FIBRIN DERIVATIVES D-DIMER ([facility] ONLY) - Abnormal; Notable for the following:    Fibrin derivatives D-dimer [facility]) 2,048.66 (*)    All other components within normal limits  TROPONIN I   ____________________________________________   EKG  Interpreted by me Normal sinus rhythm rate of 93, normal axis and intervals. Normal QRS ST segments and  T waves.  ____________________________________________    [id number]  Dg Chest 2 View  Result Date: 01/12/2017 CLINICAL DATA:  Central chest pain and short of breath today EXAM: CHEST  2 VIEW COMPARISON:  08/08/2016 FINDINGS: Normal heart size. Lungs are markedly hyperaerated. Ill-defined opacity projects over the anterior chest on the lateral view only. There is a subtle increased density in the right lower lung zone laterally. These finding may represent early airspace disease in the right middle lobe. Left lung is clear. No pneumothorax. Chronic pleural and parenchymal changes towards the lung apices. IMPRESSION: Airspace disease in the right middle lobe. Pneumonia is not excluded. Followup PA and lateral chest X-ray is recommended in 3-4 weeks following trial of antibiotic therapy to ensure resolution and exclude underlying malignancy. Electronically Signed   By: Marybelle Killings M.D.   On: 01/12/2017 15:47   Ct Angio Chest Pe W And/or Wo Contrast  Result Date: 01/12/2017 CLINICAL DATA:  Left-sided chest pain, onset this morning and worsening through the day. EXAM: CT ANGIOGRAPHY CHEST WITH CONTRAST TECHNIQUE: Multidetector CT imaging of the  chest was performed using the standard protocol during bolus administration of intravenous contrast. Multiplanar CT image reconstructions and MIPs were obtained to evaluate the vascular anatomy. CONTRAST:  75 mL Isovue 300 intravenous COMPARISON:  11/25/2015 FINDINGS: Cardiovascular: Satisfactory opacification of the pulmonary arteries to the segmental level. No evidence of pulmonary embolism. Normal heart size. No pericardial effusion. Mediastinum/Nodes: No enlarged mediastinal, hilar, or axillary lymph nodes. Thyroid gland, trachea, and esophagus demonstrate no significant findings. Lungs/Pleura: Hyperinflation and centrilobular emphysematous disease, upper lobe predominant. There is consolidation in the lingula which was visible on the lateral radiographic few although not well localized with regard to laterality ; this likely represents lingular pneumonia. No pleural effusion. Central airways are patent and normal in caliber. Minor linear scarring in the right middle lobe is unchanged from 11/25/2015. Upper Abdomen: No acute abnormality. Musculoskeletal: No significant skeletal lesion. Review of the MIP images confirms the above findings. IMPRESSION: 1. Negative for pulmonary embolism. 2. Lingular consolidation, likely pneumonia. 3. Hyperinflation and centrilobular emphysematous disease. Emphysema (ICD10-J43.9). Electronically Signed   By: Andreas Newport M.D.   On: 01/12/2017 21:24    ____________________________________________   PROCEDURES Procedures  ____________________________________________   INITIAL IMPRESSION / ASSESSMENT AND PLAN / ED COURSE  Pertinent labs & imaging results that were available during my care of the patient were reviewed by me and considered in my medical decision making (see chart for details).  Patient presents with pleuritic left-sided chest pain. X-rays suggest right-sided pneumonia, but patient does not have fever or cough and all her symptoms are on the left.  Since this does not match, we'll get a d-dimer for screening for venous thromboembolism. She is overall low risk for PE given that she is not hypoxic and tachypneic or tachycardic and is low risk by well's criteria.  Clinical Course as of Jan 13 2215  Fri Jan 12, 2017  2050 CTA chest.  Fibrin derivatives D-dimer [facility]): Marland Kitchen 2,355.73 [PS]    Clinical Course User Index [PS] Carrie Mew, MD      ----------------------------------------- 10:25 PM on 01/12/2017 -----------------------------------------  CT shows that the patient actually has consolidation of the lingula compatible with her symptoms and community-acquired pneumonia. The x-ray finding of the right middle lobe scarring which is unchanged from previous. Patient does concur that she had pneumonia in the area in the past. She is well-appearing, not in distress, has no complaints except for the chest pain  from pleurisy. Suitable for outpatient follow-up, will continue her on azithromycin. Sodium level of 127 noted, but her baseline chronically is about 136 this does not represent a significant change. Additionally, she has no evidence of neurologic or cardiac dysfunction. No dizziness weakness ectopy or abnormal cardiac intervals. ____________________________________________   FINAL CLINICAL IMPRESSION(S) / ED DIAGNOSES  Final diagnoses:  Lingular pneumonia  Pleurisy  Chronic hyponatremia      New Prescriptions   AZITHROMYCIN (ZITHROMAX Z-PAK) 250 MG TABLET    Take 2 tablets (500 mg) on  Day 1,  followed by 1 tablet (250 mg) once daily on Days 2 through 5.     Portions of this note were generated with dragon dictation software. Dictation errors may occur despite best attempts at proofreading.    Carrie Mew, MD 01/12/17 2227

## 2017-01-12 NOTE — ED Triage Notes (Signed)
Pt to ED c/o chest pain on the left side, states that the pain started this morning and has continued to get worse. Pt denies being short of breath but states that she can not take a deep breath. Pt denies any other symptoms at this time. Pt does not appear to be in any distress.

## 2017-01-12 NOTE — ED Notes (Signed)
Patient transported to CT 

## 2017-01-12 NOTE — Discharge Instructions (Signed)
Results for orders placed or performed during the hospital encounter of 02/63/78  Basic metabolic panel  Result Value Ref Range   Sodium 127 (L) 135 - 145 mmol/L   Potassium 4.3 3.5 - 5.1 mmol/L   Chloride 91 (L) 101 - 111 mmol/L   CO2 26 22 - 32 mmol/L   Glucose, Bld 110 (H) 65 - 99 mg/dL   BUN 12 6 - 20 mg/dL   Creatinine, Ser 0.73 0.44 - 1.00 mg/dL   Calcium 9.5 8.9 - 10.3 mg/dL   GFR calc non Af Amer >60 >60 mL/min   GFR calc Af Amer >60 >60 mL/min   Anion gap 10 5 - 15  CBC  Result Value Ref Range   WBC 12.0 (H) 3.6 - 11.0 K/uL   RBC 4.14 3.80 - 5.20 MIL/uL   Hemoglobin 13.2 12.0 - 16.0 g/dL   HCT 37.1 35.0 - 47.0 %   MCV 89.6 80.0 - 100.0 fL   MCH 31.8 26.0 - 34.0 pg   MCHC 35.5 32.0 - 36.0 g/dL   RDW 13.1 11.5 - 14.5 %   Platelets 263 150 - 440 K/uL  Troponin I  Result Value Ref Range   Troponin I <0.03 <0.03 ng/mL  Fibrin derivatives D-Dimer ([facility] only)  Result Value Ref Range   Fibrin derivatives D-dimer ([facility]) 2,048.66 (H) 0.00 - 499.00   Dg Chest 2 View  Result Date: 01/12/2017 CLINICAL DATA:  Central chest pain and short of breath today EXAM: CHEST  2 VIEW COMPARISON:  08/08/2016 FINDINGS: Normal heart size. Lungs are markedly hyperaerated. Ill-defined opacity projects over the anterior chest on the lateral view only. There is a subtle increased density in the right lower lung zone laterally. These finding may represent early airspace disease in the right middle lobe. Left lung is clear. No pneumothorax. Chronic pleural and parenchymal changes towards the lung apices. IMPRESSION: Airspace disease in the right middle lobe. Pneumonia is not excluded. Followup PA and lateral chest X-ray is recommended in 3-4 weeks following trial of antibiotic therapy to ensure resolution and exclude underlying malignancy. Electronically Signed   By: Marybelle Killings M.D.   On: 01/12/2017 15:47   Ct Angio Chest Pe W And/or Wo Contrast  Result Date: 01/12/2017 CLINICAL DATA:  Left-sided  chest pain, onset this morning and worsening through the day. EXAM: CT ANGIOGRAPHY CHEST WITH CONTRAST TECHNIQUE: Multidetector CT imaging of the chest was performed using the standard protocol during bolus administration of intravenous contrast. Multiplanar CT image reconstructions and MIPs were obtained to evaluate the vascular anatomy. CONTRAST:  75 mL Isovue 300 intravenous COMPARISON:  11/25/2015 FINDINGS: Cardiovascular: Satisfactory opacification of the pulmonary arteries to the segmental level. No evidence of pulmonary embolism. Normal heart size. No pericardial effusion. Mediastinum/Nodes: No enlarged mediastinal, hilar, or axillary lymph nodes. Thyroid gland, trachea, and esophagus demonstrate no significant findings. Lungs/Pleura: Hyperinflation and centrilobular emphysematous disease, upper lobe predominant. There is consolidation in the lingula which was visible on the lateral radiographic few although not well localized with regard to laterality ; this likely represents lingular pneumonia. No pleural effusion. Central airways are patent and normal in caliber. Minor linear scarring in the right middle lobe is unchanged from 11/25/2015. Upper Abdomen: No acute abnormality. Musculoskeletal: No significant skeletal lesion. Review of the MIP images confirms the above findings. IMPRESSION: 1. Negative for pulmonary embolism. 2. Lingular consolidation, likely pneumonia. 3. Hyperinflation and centrilobular emphysematous disease. Emphysema (ICD10-J43.9). Electronically Signed   By: Andreas Newport M.D.   On:  01/12/2017 21:24  ° ° °

## 2017-01-25 DIAGNOSIS — J449 Chronic obstructive pulmonary disease, unspecified: Secondary | ICD-10-CM | POA: Diagnosis not present

## 2017-01-25 DIAGNOSIS — R0602 Shortness of breath: Secondary | ICD-10-CM | POA: Diagnosis not present

## 2017-01-25 DIAGNOSIS — R636 Underweight: Secondary | ICD-10-CM | POA: Diagnosis not present

## 2017-01-25 DIAGNOSIS — I10 Essential (primary) hypertension: Secondary | ICD-10-CM | POA: Diagnosis not present

## 2017-02-05 DIAGNOSIS — R21 Rash and other nonspecific skin eruption: Secondary | ICD-10-CM | POA: Diagnosis not present

## 2017-02-05 DIAGNOSIS — R590 Localized enlarged lymph nodes: Secondary | ICD-10-CM | POA: Diagnosis not present

## 2017-02-05 DIAGNOSIS — I10 Essential (primary) hypertension: Secondary | ICD-10-CM | POA: Diagnosis not present

## 2017-02-15 DIAGNOSIS — F17290 Nicotine dependence, other tobacco product, uncomplicated: Secondary | ICD-10-CM | POA: Diagnosis not present

## 2017-02-15 DIAGNOSIS — I10 Essential (primary) hypertension: Secondary | ICD-10-CM | POA: Diagnosis not present

## 2017-02-15 DIAGNOSIS — J449 Chronic obstructive pulmonary disease, unspecified: Secondary | ICD-10-CM | POA: Diagnosis not present

## 2017-03-01 ENCOUNTER — Emergency Department: Payer: Medicare Other

## 2017-03-01 DIAGNOSIS — Z792 Long term (current) use of antibiotics: Secondary | ICD-10-CM

## 2017-03-01 DIAGNOSIS — Z90710 Acquired absence of both cervix and uterus: Secondary | ICD-10-CM

## 2017-03-01 DIAGNOSIS — E43 Unspecified severe protein-calorie malnutrition: Secondary | ICD-10-CM | POA: Diagnosis present

## 2017-03-01 DIAGNOSIS — R0602 Shortness of breath: Secondary | ICD-10-CM | POA: Diagnosis not present

## 2017-03-01 DIAGNOSIS — F419 Anxiety disorder, unspecified: Secondary | ICD-10-CM | POA: Diagnosis present

## 2017-03-01 DIAGNOSIS — J159 Unspecified bacterial pneumonia: Secondary | ICD-10-CM | POA: Diagnosis not present

## 2017-03-01 DIAGNOSIS — J440 Chronic obstructive pulmonary disease with acute lower respiratory infection: Secondary | ICD-10-CM | POA: Diagnosis not present

## 2017-03-01 DIAGNOSIS — F17210 Nicotine dependence, cigarettes, uncomplicated: Secondary | ICD-10-CM | POA: Diagnosis present

## 2017-03-01 DIAGNOSIS — Z7951 Long term (current) use of inhaled steroids: Secondary | ICD-10-CM

## 2017-03-01 DIAGNOSIS — E860 Dehydration: Secondary | ICD-10-CM | POA: Diagnosis present

## 2017-03-01 DIAGNOSIS — E876 Hypokalemia: Secondary | ICD-10-CM | POA: Diagnosis present

## 2017-03-01 DIAGNOSIS — Z886 Allergy status to analgesic agent status: Secondary | ICD-10-CM

## 2017-03-01 DIAGNOSIS — Z79899 Other long term (current) drug therapy: Secondary | ICD-10-CM

## 2017-03-01 DIAGNOSIS — Z681 Body mass index (BMI) 19 or less, adult: Secondary | ICD-10-CM

## 2017-03-01 DIAGNOSIS — Z803 Family history of malignant neoplasm of breast: Secondary | ICD-10-CM

## 2017-03-01 DIAGNOSIS — Z888 Allergy status to other drugs, medicaments and biological substances status: Secondary | ICD-10-CM

## 2017-03-01 DIAGNOSIS — J9621 Acute and chronic respiratory failure with hypoxia: Secondary | ICD-10-CM | POA: Diagnosis present

## 2017-03-01 DIAGNOSIS — Y92230 Patient room in hospital as the place of occurrence of the external cause: Secondary | ICD-10-CM | POA: Diagnosis not present

## 2017-03-01 DIAGNOSIS — Z716 Tobacco abuse counseling: Secondary | ICD-10-CM

## 2017-03-01 DIAGNOSIS — I10 Essential (primary) hypertension: Secondary | ICD-10-CM | POA: Diagnosis present

## 2017-03-01 DIAGNOSIS — R630 Anorexia: Secondary | ICD-10-CM | POA: Diagnosis present

## 2017-03-01 DIAGNOSIS — E871 Hypo-osmolality and hyponatremia: Secondary | ICD-10-CM | POA: Diagnosis present

## 2017-03-01 DIAGNOSIS — I25110 Atherosclerotic heart disease of native coronary artery with unstable angina pectoris: Secondary | ICD-10-CM | POA: Diagnosis not present

## 2017-03-01 DIAGNOSIS — I248 Other forms of acute ischemic heart disease: Secondary | ICD-10-CM | POA: Diagnosis present

## 2017-03-01 DIAGNOSIS — Z882 Allergy status to sulfonamides status: Secondary | ICD-10-CM

## 2017-03-01 DIAGNOSIS — T380X5A Adverse effect of glucocorticoids and synthetic analogues, initial encounter: Secondary | ICD-10-CM | POA: Diagnosis not present

## 2017-03-01 DIAGNOSIS — J441 Chronic obstructive pulmonary disease with (acute) exacerbation: Secondary | ICD-10-CM | POA: Diagnosis present

## 2017-03-01 DIAGNOSIS — Z7952 Long term (current) use of systemic steroids: Secondary | ICD-10-CM

## 2017-03-01 DIAGNOSIS — Z8701 Personal history of pneumonia (recurrent): Secondary | ICD-10-CM

## 2017-03-01 DIAGNOSIS — I340 Nonrheumatic mitral (valve) insufficiency: Secondary | ICD-10-CM | POA: Diagnosis present

## 2017-03-01 DIAGNOSIS — R079 Chest pain, unspecified: Secondary | ICD-10-CM | POA: Diagnosis not present

## 2017-03-01 DIAGNOSIS — D649 Anemia, unspecified: Secondary | ICD-10-CM | POA: Diagnosis present

## 2017-03-01 DIAGNOSIS — I714 Abdominal aortic aneurysm, without rupture: Secondary | ICD-10-CM | POA: Diagnosis present

## 2017-03-01 LAB — CBC
HCT: 33 % — ABNORMAL LOW (ref 35.0–47.0)
Hemoglobin: 11.6 g/dL — ABNORMAL LOW (ref 12.0–16.0)
MCH: 31.3 pg (ref 26.0–34.0)
MCHC: 35.3 g/dL (ref 32.0–36.0)
MCV: 88.8 fL (ref 80.0–100.0)
Platelets: 237 10*3/uL (ref 150–440)
RBC: 3.72 MIL/uL — AB (ref 3.80–5.20)
RDW: 12.9 % (ref 11.5–14.5)
WBC: 12.9 10*3/uL — AB (ref 3.6–11.0)

## 2017-03-01 NOTE — ED Triage Notes (Signed)
Patient c/o SOB and left chest pain for the last 3-7 days. Pain radiates to back. Pt c/o N/V beginning today with 1 emesis in the last 24 hours.

## 2017-03-02 ENCOUNTER — Inpatient Hospital Stay
Admission: EM | Admit: 2017-03-02 | Discharge: 2017-03-06 | DRG: 286 | Disposition: A | Discharge status: Home / Self Care | Payer: Medicare Other | Attending: Internal Medicine | Admitting: Internal Medicine

## 2017-03-02 ENCOUNTER — Inpatient Hospital Stay: Payer: Medicare Other

## 2017-03-02 DIAGNOSIS — E871 Hypo-osmolality and hyponatremia: Secondary | ICD-10-CM

## 2017-03-02 DIAGNOSIS — R002 Palpitations: Secondary | ICD-10-CM | POA: Diagnosis not present

## 2017-03-02 DIAGNOSIS — I214 Non-ST elevation (NSTEMI) myocardial infarction: Secondary | ICD-10-CM | POA: Diagnosis not present

## 2017-03-02 DIAGNOSIS — I2510 Atherosclerotic heart disease of native coronary artery without angina pectoris: Secondary | ICD-10-CM

## 2017-03-02 DIAGNOSIS — J441 Chronic obstructive pulmonary disease with (acute) exacerbation: Secondary | ICD-10-CM

## 2017-03-02 DIAGNOSIS — I10 Essential (primary) hypertension: Secondary | ICD-10-CM | POA: Diagnosis present

## 2017-03-02 DIAGNOSIS — J9620 Acute and chronic respiratory failure, unspecified whether with hypoxia or hypercapnia: Secondary | ICD-10-CM | POA: Diagnosis not present

## 2017-03-02 DIAGNOSIS — Z9889 Other specified postprocedural states: Secondary | ICD-10-CM

## 2017-03-02 DIAGNOSIS — Z803 Family history of malignant neoplasm of breast: Secondary | ICD-10-CM | POA: Diagnosis not present

## 2017-03-02 DIAGNOSIS — D72829 Elevated white blood cell count, unspecified: Secondary | ICD-10-CM

## 2017-03-02 DIAGNOSIS — R079 Chest pain, unspecified: Secondary | ICD-10-CM | POA: Diagnosis not present

## 2017-03-02 DIAGNOSIS — J189 Pneumonia, unspecified organism: Secondary | ICD-10-CM

## 2017-03-02 DIAGNOSIS — D649 Anemia, unspecified: Secondary | ICD-10-CM | POA: Diagnosis present

## 2017-03-02 DIAGNOSIS — E860 Dehydration: Secondary | ICD-10-CM

## 2017-03-02 DIAGNOSIS — I200 Unstable angina: Secondary | ICD-10-CM | POA: Diagnosis not present

## 2017-03-02 DIAGNOSIS — I248 Other forms of acute ischemic heart disease: Secondary | ICD-10-CM

## 2017-03-02 DIAGNOSIS — F17210 Nicotine dependence, cigarettes, uncomplicated: Secondary | ICD-10-CM | POA: Diagnosis present

## 2017-03-02 DIAGNOSIS — R0602 Shortness of breath: Secondary | ICD-10-CM | POA: Diagnosis not present

## 2017-03-02 DIAGNOSIS — I714 Abdominal aortic aneurysm, without rupture: Secondary | ICD-10-CM | POA: Diagnosis present

## 2017-03-02 DIAGNOSIS — Z8701 Personal history of pneumonia (recurrent): Secondary | ICD-10-CM | POA: Diagnosis not present

## 2017-03-02 DIAGNOSIS — T380X5A Adverse effect of glucocorticoids and synthetic analogues, initial encounter: Secondary | ICD-10-CM | POA: Diagnosis not present

## 2017-03-02 DIAGNOSIS — Z90710 Acquired absence of both cervix and uterus: Secondary | ICD-10-CM | POA: Diagnosis not present

## 2017-03-02 DIAGNOSIS — I340 Nonrheumatic mitral (valve) insufficiency: Secondary | ICD-10-CM | POA: Diagnosis not present

## 2017-03-02 DIAGNOSIS — J449 Chronic obstructive pulmonary disease, unspecified: Secondary | ICD-10-CM | POA: Diagnosis not present

## 2017-03-02 DIAGNOSIS — E43 Unspecified severe protein-calorie malnutrition: Secondary | ICD-10-CM | POA: Diagnosis present

## 2017-03-02 DIAGNOSIS — J9621 Acute and chronic respiratory failure with hypoxia: Secondary | ICD-10-CM | POA: Diagnosis not present

## 2017-03-02 DIAGNOSIS — E876 Hypokalemia: Secondary | ICD-10-CM | POA: Diagnosis present

## 2017-03-02 DIAGNOSIS — Z882 Allergy status to sulfonamides status: Secondary | ICD-10-CM | POA: Diagnosis not present

## 2017-03-02 DIAGNOSIS — Z886 Allergy status to analgesic agent status: Secondary | ICD-10-CM | POA: Diagnosis not present

## 2017-03-02 DIAGNOSIS — Y92230 Patient room in hospital as the place of occurrence of the external cause: Secondary | ICD-10-CM | POA: Diagnosis not present

## 2017-03-02 DIAGNOSIS — F419 Anxiety disorder, unspecified: Secondary | ICD-10-CM | POA: Diagnosis present

## 2017-03-02 DIAGNOSIS — R531 Weakness: Secondary | ICD-10-CM

## 2017-03-02 DIAGNOSIS — I2489 Other forms of acute ischemic heart disease: Secondary | ICD-10-CM

## 2017-03-02 DIAGNOSIS — I25110 Atherosclerotic heart disease of native coronary artery with unstable angina pectoris: Secondary | ICD-10-CM | POA: Diagnosis not present

## 2017-03-02 DIAGNOSIS — Z681 Body mass index (BMI) 19 or less, adult: Secondary | ICD-10-CM | POA: Diagnosis not present

## 2017-03-02 DIAGNOSIS — Z716 Tobacco abuse counseling: Secondary | ICD-10-CM | POA: Diagnosis not present

## 2017-03-02 DIAGNOSIS — J159 Unspecified bacterial pneumonia: Secondary | ICD-10-CM | POA: Diagnosis not present

## 2017-03-02 DIAGNOSIS — J440 Chronic obstructive pulmonary disease with acute lower respiratory infection: Secondary | ICD-10-CM | POA: Diagnosis present

## 2017-03-02 LAB — BASIC METABOLIC PANEL
Anion gap: 7 (ref 5–15)
BUN: 6 mg/dL (ref 6–20)
CALCIUM: 8.9 mg/dL (ref 8.9–10.3)
CHLORIDE: 94 mmol/L — AB (ref 101–111)
CO2: 27 mmol/L (ref 22–32)
CREATININE: 0.68 mg/dL (ref 0.44–1.00)
Glucose, Bld: 101 mg/dL — ABNORMAL HIGH (ref 65–99)
Potassium: 4.4 mmol/L (ref 3.5–5.1)
SODIUM: 128 mmol/L — AB (ref 135–145)

## 2017-03-02 LAB — COMPREHENSIVE METABOLIC PANEL
ALBUMIN: 3.4 g/dL — AB (ref 3.5–5.0)
ALK PHOS: 90 U/L (ref 38–126)
ALT: 14 U/L (ref 14–54)
AST: 23 U/L (ref 15–41)
Anion gap: 9 (ref 5–15)
BILIRUBIN TOTAL: 0.8 mg/dL (ref 0.3–1.2)
BUN: 7 mg/dL (ref 6–20)
CALCIUM: 8.9 mg/dL (ref 8.9–10.3)
CO2: 24 mmol/L (ref 22–32)
Chloride: 87 mmol/L — ABNORMAL LOW (ref 101–111)
Creatinine, Ser: 0.57 mg/dL (ref 0.44–1.00)
GFR calc Af Amer: >60 mL/min (ref >60)
GFR calc non Af Amer: >60 mL/min (ref >60)
GLUCOSE: 117 mg/dL — AB (ref 65–99)
Potassium: 4.1 mmol/L (ref 3.5–5.1)
Sodium: 120 mmol/L — ABNORMAL LOW (ref 135–145)
TOTAL PROTEIN: 6.9 g/dL (ref 6.5–8.1)

## 2017-03-02 LAB — EXPECTORATED SPUTUM ASSESSMENT W GRAM STAIN, RFLX TO RESP C

## 2017-03-02 LAB — EXPECTORATED SPUTUM ASSESSMENT W REFEX TO RESP CULTURE

## 2017-03-02 LAB — LIPASE, BLOOD: Lipase: 27 U/L (ref 11–51)

## 2017-03-02 LAB — TROPONIN I: Troponin I: <0.03 ng/mL (ref <0.03)

## 2017-03-02 LAB — FIBRIN DERIVATIVES D-DIMER (ARMC ONLY): FIBRIN DERIVATIVES D-DIMER (ARMC): 2920.3 — AB (ref 0.00–499.00)

## 2017-03-02 MED ORDER — TROLAMINE SALICYLATE 10 % EX CREA
TOPICAL_CREAM | CUTANEOUS | Status: DC | PRN
  Filled 2017-03-02: qty 85

## 2017-03-02 MED ORDER — LORATADINE 10 MG PO TABS
10.0000 mg | ORAL_TABLET | Freq: Every day | ORAL | Status: DC
  Administered 2017-03-02 – 2017-03-06 (×5): 10 mg via ORAL
  Filled 2017-03-02 (×5): qty 1

## 2017-03-02 MED ORDER — PREDNISONE 20 MG PO TABS
20.0000 mg | ORAL_TABLET | Freq: Every day | ORAL | Status: AC
  Filled 2017-03-02: qty 1

## 2017-03-02 MED ORDER — ALUM & MAG HYDROXIDE-SIMETH 200-200-20 MG/5ML PO SUSP
30.0000 mL | Freq: Four times a day (QID) | ORAL | Status: DC | PRN
  Administered 2017-03-03 – 2017-03-06 (×3): 30 mL via ORAL
  Filled 2017-03-02 (×3): qty 30

## 2017-03-02 MED ORDER — AZITHROMYCIN 250 MG PO TABS
250.0000 mg | ORAL_TABLET | Freq: Every day | ORAL | Status: AC
  Administered 2017-03-03 – 2017-03-06 (×4): 250 mg via ORAL
  Filled 2017-03-02 (×4): qty 1

## 2017-03-02 MED ORDER — TIOTROPIUM BROMIDE MONOHYDRATE 18 MCG IN CAPS
18.0000 ug | ORAL_CAPSULE | Freq: Every day | RESPIRATORY_TRACT | Status: DC
  Filled 2017-03-02: qty 5

## 2017-03-02 MED ORDER — PREDNISONE 50 MG PO TABS
50.0000 mg | ORAL_TABLET | Freq: Every day | ORAL | Status: AC
  Administered 2017-03-02: 50 mg via ORAL
  Filled 2017-03-02: qty 1

## 2017-03-02 MED ORDER — IPRATROPIUM-ALBUTEROL 0.5-2.5 (3) MG/3ML IN SOLN
3.0000 mL | RESPIRATORY_TRACT | Status: DC
  Administered 2017-03-02 – 2017-03-03 (×8): 3 mL via RESPIRATORY_TRACT
  Filled 2017-03-02 (×8): qty 3

## 2017-03-02 MED ORDER — AZITHROMYCIN 500 MG PO TABS
500.0000 mg | ORAL_TABLET | Freq: Every day | ORAL | Status: AC
  Administered 2017-03-02: 500 mg via ORAL
  Filled 2017-03-02 (×2): qty 1

## 2017-03-02 MED ORDER — ADULT MULTIVITAMIN LIQUID CH
15.0000 mL | Freq: Every day | ORAL | Status: DC
  Administered 2017-03-03 – 2017-03-06 (×4): 15 mL via ORAL
  Filled 2017-03-02 (×4): qty 15

## 2017-03-02 MED ORDER — IPRATROPIUM-ALBUTEROL 0.5-2.5 (3) MG/3ML IN SOLN
3.0000 mL | Freq: Once | RESPIRATORY_TRACT | Status: AC
  Administered 2017-03-02: 3 mL via RESPIRATORY_TRACT
  Filled 2017-03-02: qty 3

## 2017-03-02 MED ORDER — METHYLPREDNISOLONE SODIUM SUCC 125 MG IJ SOLR
125.0000 mg | Freq: Once | INTRAMUSCULAR | Status: DC
  Filled 2017-03-02: qty 2

## 2017-03-02 MED ORDER — DEXTROSE 5 % IV SOLN
1.0000 g | INTRAVENOUS | Status: DC
  Administered 2017-03-02 – 2017-03-04 (×3): 1 g via INTRAVENOUS
  Filled 2017-03-02 (×5): qty 10

## 2017-03-02 MED ORDER — GUAIFENESIN ER 600 MG PO TB12
600.0000 mg | ORAL_TABLET | Freq: Two times a day (BID) | ORAL | Status: DC
  Administered 2017-03-02 – 2017-03-06 (×9): 600 mg via ORAL
  Filled 2017-03-02 (×10): qty 1

## 2017-03-02 MED ORDER — MORPHINE SULFATE (PF) 2 MG/ML IV SOLN
1.0000 mg | INTRAVENOUS | Status: DC | PRN
  Filled 2017-03-02: qty 1

## 2017-03-02 MED ORDER — ACETAMINOPHEN 325 MG PO TABS
ORAL_TABLET | ORAL | Status: AC
  Filled 2017-03-02: qty 2

## 2017-03-02 MED ORDER — LORAZEPAM 1 MG PO TABS
1.0000 mg | ORAL_TABLET | Freq: Four times a day (QID) | ORAL | Status: DC | PRN
  Administered 2017-03-02 – 2017-03-04 (×3): 1 mg via ORAL
  Filled 2017-03-02 (×4): qty 1

## 2017-03-02 MED ORDER — SODIUM CHLORIDE 0.9 % IV BOLUS (SEPSIS)
1000.0000 mL | Freq: Once | INTRAVENOUS | Status: AC
  Administered 2017-03-02: 1000 mL via INTRAVENOUS

## 2017-03-02 MED ORDER — PREDNISONE 20 MG PO TABS
40.0000 mg | ORAL_TABLET | Freq: Every day | ORAL | Status: AC
  Administered 2017-03-03: 40 mg via ORAL
  Filled 2017-03-02: qty 2

## 2017-03-02 MED ORDER — ACETAMINOPHEN 325 MG PO TABS
650.0000 mg | ORAL_TABLET | Freq: Once | ORAL | Status: AC
  Administered 2017-03-02: 650 mg via ORAL

## 2017-03-02 MED ORDER — ENSURE ENLIVE PO LIQD
237.0000 mL | ORAL | Status: DC
  Administered 2017-03-03: 237 mL via ORAL

## 2017-03-02 MED ORDER — PREDNISONE 10 MG PO TABS
10.0000 mg | ORAL_TABLET | Freq: Every day | ORAL | Status: AC
  Administered 2017-03-06: 10 mg via ORAL
  Filled 2017-03-02: qty 1

## 2017-03-02 MED ORDER — GI COCKTAIL ~~LOC~~
30.0000 mL | Freq: Once | ORAL | Status: AC
  Administered 2017-03-02: 30 mL via ORAL
  Filled 2017-03-02 (×2): qty 30

## 2017-03-02 MED ORDER — LISINOPRIL 5 MG PO TABS
2.5000 mg | ORAL_TABLET | Freq: Every day | ORAL | Status: DC
  Filled 2017-03-02: qty 1

## 2017-03-02 MED ORDER — METOPROLOL TARTRATE 25 MG PO TABS
25.0000 mg | ORAL_TABLET | Freq: Once | ORAL | Status: AC
  Administered 2017-03-02: 25 mg via ORAL
  Filled 2017-03-02: qty 1

## 2017-03-02 MED ORDER — ENOXAPARIN SODIUM 30 MG/0.3ML ~~LOC~~ SOLN
30.0000 mg | SUBCUTANEOUS | Status: DC
  Filled 2017-03-02: qty 0.3

## 2017-03-02 MED ORDER — HYDROCOD POLST-CPM POLST ER 10-8 MG/5ML PO SUER
5.0000 mL | Freq: Two times a day (BID) | ORAL | Status: DC
  Administered 2017-03-02 – 2017-03-06 (×8): 5 mL via ORAL
  Filled 2017-03-02 (×9): qty 5

## 2017-03-02 MED ORDER — IOPAMIDOL (ISOVUE-370) INJECTION 76%
75.0000 mL | Freq: Once | INTRAVENOUS | Status: AC | PRN
  Administered 2017-03-02: 75 mL via INTRAVENOUS

## 2017-03-02 MED ORDER — PREDNISONE 10 MG PO TABS: 5.0000 mg | ORAL_TABLET | Freq: Every day | ORAL | Status: DC

## 2017-03-02 MED ORDER — PREDNISONE 20 MG PO TABS
30.0000 mg | ORAL_TABLET | Freq: Every day | ORAL | Status: AC
  Administered 2017-03-04: 30 mg via ORAL
  Filled 2017-03-02: qty 1

## 2017-03-02 MED ORDER — SODIUM CHLORIDE 0.9 % IV SOLN
INTRAVENOUS | Status: DC
  Administered 2017-03-02: 10:00:00 via INTRAVENOUS

## 2017-03-02 MED ORDER — HALOPERIDOL 2 MG PO TABS: 2.0000 mg | ORAL_TABLET | Freq: Three times a day (TID) | ORAL | Status: DC | PRN

## 2017-03-02 NOTE — Progress Notes (Signed)
[facility] [place] NAME: Christina Hester    MR#:  [id number]  DATE OF BIRTH:  1941/02/15  SUBJECTIVE:  CHIEF COMPLAINT:   Chief Complaint  Patient presents with  . Shortness of Breath  . Chest Pain  . Emesis   Patient is 76 year old Caucasian female with past medical history significant for history of nonproductive cough, shortness of breath, left-sided chest pain, nausea and vomiting, generalized weakness for about 1 day. Patient's chest x-ray revealed bronchitis, CT angiogram of the chest revealed multifocal pneumonia. Patient is initiated on Rocephin and Zithromax and admitted. She complains of left-sided chest pain.  Review of Systems  Constitutional: Negative for chills, fever and weight loss.  HENT: Negative for congestion.   Eyes: Negative for blurred vision and double vision.  Respiratory: Positive for cough, sputum production and shortness of breath. Negative for wheezing.   Cardiovascular: Negative for chest pain, palpitations, orthopnea, leg swelling and PND.  Gastrointestinal: Negative for abdominal pain, blood in stool, constipation, diarrhea, nausea and vomiting.  Genitourinary: Negative for dysuria, frequency, hematuria and urgency.  Musculoskeletal: Negative for falls.  Neurological: Negative for dizziness, tremors, focal weakness and headaches.  Endo/Heme/Allergies: Does not bruise/bleed easily.  Psychiatric/Behavioral: Negative for depression. The patient does not have insomnia.     VITAL SIGNS: Blood pressure 131/90, pulse 82, temperature 97.7 F (36.5 C), temperature source Oral, resp. rate 16, height 5\' 1"  (1.549 m), weight 40.8 kg (90 lb), SpO2 99 %.  PHYSICAL EXAMINATION:   GENERAL:  76 y.o.-year-old patient lying in the bed in mild-to-moderate respiratory distress, pursed lip breathing.  EYES: Pupils equal, round, reactive to light and accommodation. No scleral icterus. Extraocular muscles intact.    HEENT: Head atraumatic, normocephalic. Oropharynx and nasopharynx clear.  NECK:  Supple, no jugular venous distention. No thyroid enlargement, no tenderness.  LUNGS: Diminished breath sounds bilaterally, no wheezing, few scattered rales,rhonchi and crepitations noted bilaterally. Intermittent use of accessory muscles of respiration, with speech and movements.  CARDIOVASCULAR: S1, S2 normal. No murmurs, rubs, or gallops.  ABDOMEN: Soft, nontender, nondistended. Bowel sounds present. No organomegaly or mass.  EXTREMITIES: No pedal edema, cyanosis, or clubbing.  NEUROLOGIC: Cranial nerves II through XII are intact. Muscle strength 5/5 in all extremities. Sensation intact. Gait not checked.  PSYCHIATRIC: The patient is alert and oriented x 3.  SKIN: No obvious rash, lesion, or ulcer.   ORDERS/RESULTS REVIEWED:   CBC  Recent Labs Lab 03/01/17 2347  WBC 12.9*  HGB 11.6*  HCT 33.0*  PLT 237  MCV 88.8  MCH 31.3  MCHC 35.3  RDW 12.9   ------------------------------------------------------------------------------------------------------------------  Chemistries   Recent Labs Lab 03/01/17 2347 03/02/17 0747  NA 120* 128*  K 4.1 4.4  CL 87* 94*  CO2 24 27  GLUCOSE 117* 101*  BUN 7 6  CREATININE 0.57 0.68  CALCIUM 8.9 8.9  AST 23  --   ALT 14  --   ALKPHOS 90  --   BILITOT 0.8  --    ------------------------------------------------------------------------------------------------------------------ estimated creatinine clearance is 39.1 mL/min (by C-G formula based on SCr of 0.68 mg/dL). ------------------------------------------------------------------------------------------------------------------ No results for input(s): TSH, T4TOTAL, T3FREE, THYROIDAB in the last 72 hours.  Invalid input(s): FREET3  Cardiac Enzymes  Recent Labs Lab 03/01/17 2347  TROPONINI <0.03    ------------------------------------------------------------------------------------------------------------------ Invalid input(s): POCBNP ---------------------------------------------------------------------------------------------------------------  RADIOLOGY: Dg Chest 2 View  Result Date: 03/02/2017 CLINICAL DATA:  Acute onset of shortness of breath and left chest pain, radiating  to the back. Nausea and vomiting. Initial encounter. EXAM: CHEST  2 VIEW COMPARISON:  Chest radiograph and CTA of the chest performed 01/12/2017 FINDINGS: The lungs are well-aerated. Peribronchial thickening is noted. There is no evidence of focal opacification, pleural effusion or pneumothorax. The heart is normal in size; the mediastinal contour is within normal limits. No acute osseous abnormalities are seen. IMPRESSION: Peribronchial thickening noted.  Lungs otherwise grossly clear. Electronically Signed   By: Garald Balding M.D.   On: 03/02/2017 00:14   Ct Angio Chest Pe W Or Wo Contrast  Result Date: 03/02/2017 CLINICAL DATA:  Left-sided chest pain and shortness of breath for 3 days. EXAM: CT ANGIOGRAPHY CHEST WITH CONTRAST TECHNIQUE: Multidetector CT imaging of the chest was performed using the standard protocol during bolus administration of intravenous contrast. Multiplanar CT image reconstructions and MIPs were obtained to evaluate the vascular anatomy. CONTRAST:  75 cc Isovue 370 intravenous contrast. COMPARISON:  Chest CT dated January 12, 2017. FINDINGS: Cardiovascular: Satisfactory opacification of the pulmonary arteries to the segmental level. No evidence of pulmonary embolism. Normal heart size. No pericardial effusion. No thoracic aortic aneurysm. Coronary, aortic arch, and branch vessel atherosclerotic vascular disease. Mediastinum/Nodes: No enlarged mediastinal, hilar, or axillary lymph nodes. Thyroid gland, trachea, and esophagus demonstrate no significant findings. Lungs/Pleura: New patchy areas of  predominantly centrilobular nodularity in the anterior and apical right upper lobe, superior and basal right lower lobe, anterior left upper lobe, to lesser extent the left lower lobe. More focal areas of consolidation in the bilateral anterior upper lobes. Consolidation in the lingula is again noted. No pleural effusion or pneumothorax. Moderate upper lobe predominant centrilobular emphysematous change, similar to prior study. Upper Abdomen: No acute abnormality. Musculoskeletal: No chest wall abnormality. No acute or significant osseous findings. Review of the MIP images confirms the above findings. IMPRESSION: 1. No evidence of pulmonary embolism. 2. New patchy areas of predominantly centrilobular nodularity in both lungs, with more focal small areas of consolidation in the lingula and bilateral anterior upper lobes. Findings are favored to represent multifocal pneumonia. 3.  Emphysema (ICD10-J43.9). 4.  Aortic atherosclerosis (ICD10-I70.0). Electronically Signed   By: Titus Dubin M.D.   On: 03/02/2017 10:38    EKG:  Orders placed or performed during the hospital encounter of 03/02/17  . ED EKG within 10 minutes  . ED EKG within 10 minutes  . EKG 12-Lead  . EKG 12-Lead    ASSESSMENT AND PLAN:  Active Problems:   Acute on chronic respiratory failure (HCC) #1. Acute respiratory failure with hypoxia, continue patient on oxygen therapy, wean off oxygen as tolerated #2. COPD exacerbation due to pneumonia, continue steroids, Tiotropium, nebulizing therapy every 4 hours, follow clinically #3. Left-sided chest pain, no pulmonary embolism, initiate patient on Tussionex, supportive therapy, keep head if needed #4. Bacterial pneumonia, continue Rocephin and Zithromax, get sputum cultures if possible, get speech therapist evaluation to rule out aspiration   Management plans discussed with the patient, family and they are in agreement.   DRUG ALLERGIES:  Allergies  Allergen Reactions  . Aspirin  Tinitus  . Prednisone Other (See Comments)    Makes violent  . Sulfa Antibiotics Nausea And Vomiting  . Symbicort [Budesonide-Formoterol Fumarate] Other (See Comments)    Patient felt "out of her mind" and angry.    CODE STATUS:  Code Status History    Date Active Date Inactive Code Status Order ID Comments User Context   07/18/2016  6:24 PM 07/20/2016  8:41 PM Full Code  [id number]  Vaughan Basta, MD ED   04/28/2016  1:56 AM 04/29/2016  1:28 PM Full Code [id number]  Harvie Bridge, DO Inpatient   11/25/2015  7:33 PM 11/26/2015  3:19 PM Full Code [id number]  Nicholes Mango, MD Inpatient      TOTAL TIME TAKING CARE OF THIS PATIENT: 35 minutes.    Theodoro Grist M.D on 03/02/2017 at 12:46 PM  Between 7am to 6pm - Pager - [phone]  After 6pm go to [email or web address] - password [email or web address] [facility]  Office  [phone]  CC: Primary care physician; Ronnell Freshwater, NP

## 2017-03-02 NOTE — Plan of Care (Signed)
Dr. Text to request orders and possible diet, per patient request.

## 2017-03-02 NOTE — Progress Notes (Signed)
HR sinus tach per tele this evening. BP 156/64. Dr notified.

## 2017-03-02 NOTE — Progress Notes (Signed)
Took over care for pt at 1500. Tele on.  NS running at 50.

## 2017-03-02 NOTE — ED Provider Notes (Signed)
[facility] Emergency Department Provider Note   ____________________________________________   First MD Initiated Contact with Patient 03/02/17 0128     (approximate)  I have reviewed the triage vital signs and the nursing notes.   HISTORY  Chief Complaint Shortness of Breath; Chest Pain; and Emesis    HPI Christina Hester is a 76 y.o. female who presents to the ED from home with a chief complaint of nonproductive cough, shortness of breath, left chest pain, nausea/vomiting 1 and generalized weakness. Patient has a history of COPD not on continuous oxygen who symptoms began 1 day ago. Also endorses lack of appetite. Denies associated fever, chills, abdominal pain, dysuria, diarrhea. Denies recent travel or trauma. Nothing makes her symptoms better despite inhaler treatment at home. Exertion makes her symptoms worse.   Past Medical History:  Diagnosis Date  . AAA (abdominal aortic aneurysm) ([place])   . COPD (chronic obstructive pulmonary disease) ([place])   . Hypertension     Patient Active Problem List   Diagnosis Date Noted  . Pneumonia 07/18/2016  . AAA (abdominal aortic aneurysm) without rupture ([place]) 05/17/2016  . PVD (peripheral vascular disease) ([place]) 05/17/2016  . Protein-calorie malnutrition, severe 04/29/2016  . Community acquired pneumonia 04/28/2016  . Chest pain 11/25/2015    Past Surgical History:  Procedure Laterality Date  . ABDOMINAL HYSTERECTOMY    . BREAST EXCISIONAL BIOPSY Left 20+ YRS AGO   NEG    Prior to Admission medications   Medication Sig Start Date End Date Taking? Authorizing Provider  acidophilus (RISAQUAD) CAPS capsule Take 1 capsule by mouth 2 (two) times daily. While on antibiotics Patient not taking: Reported on 01/12/2017 07/20/16   Gladstone Lighter, MD  albuterol (PROVENTIL HFA;VENTOLIN HFA) 108 (90 Base) MCG/ACT inhaler Inhale 2 puffs into the lungs every 6 (six) hours as needed for wheezing or shortness of  breath.    [provider]  azithromycin (ZITHROMAX Z-PAK) 250 MG tablet Take 2 tablets (500 mg) on  Day 1,  followed by 1 tablet (250 mg) once daily on Days 2 through 5. 01/12/17   Carrie Mew, MD  benzonatate (TESSALON) 100 MG capsule Take 1 capsule (100 mg total) by mouth 3 (three) times daily. Patient not taking: Reported on 01/12/2017 07/20/16   Gladstone Lighter, MD  cetirizine (ZYRTEC) 10 MG tablet Take 1 tablet by mouth daily.    [provider]  ipratropium-albuterol (DUONEB) 0.5-2.5 (3) MG/3ML SOLN Take 3 mLs by nebulization every 6 (six) hours as needed. 07/20/16   Gladstone Lighter, MD  levofloxacin (LEVAQUIN) 250 MG tablet Take 1 tablet (250 mg total) by mouth daily. X 5 more days Patient not taking: Reported on 01/12/2017 07/20/16   Gladstone Lighter, MD  lisinopril (PRINIVIL,ZESTRIL) 2.5 MG tablet Take 2.5 mg by mouth daily.    [provider]  Omega-3 Fatty Acids (FISH OIL PO) Take 1 capsule by mouth daily.    [provider]  predniSONE (STERAPRED UNI-PAK 48 TAB) 10 MG (48) TBPK tablet Take by mouth daily. 6 tabs PO x 2 days 5 tabs PO x 2 days 4 tabs PO x 2 days 3 tabs PO x 2 days 2 tabs PO x 2 days 1 tab PO x 2 days and stop Patient not taking: Reported on 01/12/2017 07/20/16   Gladstone Lighter, MD  tiotropium (SPIRIVA) 18 MCG inhalation capsule Place 18 mcg into inhaler and inhale daily.    [provider]    Allergies Aspirin; Sulfa antibiotics; and Symbicort [budesonide-formoterol  fumarate]  Family History  Problem Relation Age of Onset  . Breast cancer Mother   . Breast cancer Maternal Aunt   . Breast cancer Maternal Aunt   . Breast cancer Maternal Aunt     Social History Social History  Substance Use Topics  . Smoking status: Former Smoker    Packs/day: 0.25  . Smokeless tobacco: Never Used  . Alcohol use No    Review of Systems  Constitutional: Nausea for generalized weakness and lack of appetite. No  fever/chills. Eyes: No visual changes. ENT: No sore throat. Cardiovascular: Positive for chest pain. Respiratory: Positive for nonproductive cough and shortness of breath. Gastrointestinal: No abdominal pain.  Positive for vomiting x 1.  No diarrhea.  No constipation. Genitourinary: Negative for dysuria. Musculoskeletal: Negative for back pain. Skin: Negative for rash. Neurological: Negative for headaches, focal weakness or numbness.   ____________________________________________   PHYSICAL EXAM:  VITAL SIGNS: ED Triage Vitals  Enc Vitals Group     BP 03/01/17 2338 (!) 126/107     Pulse Rate 03/01/17 2338 100     Resp 03/01/17 2338 (!) 22     Temp 03/01/17 2338 98.8 F (37.1 C)     Temp Source 03/01/17 2338 Oral     SpO2 03/01/17 2338 92 %     Weight 03/01/17 2339 90 lb (40.8 kg)     Height 03/01/17 2339 5\' 1"  (1.549 m)     Head Circumference --      Peak Flow --      Pain Score --      Pain Loc --      Pain Edu? --      Excl. in [place]? --     Constitutional: Alert and oriented. Frail appearing and in no acute distress. Eyes: Conjunctivae are normal. PERRL. EOMI. Head: Atraumatic. Nose: No congestion/rhinnorhea. Mouth/Throat: Mucous membranes are moist.  Oropharynx non-erythematous. Neck: No stridor.   Cardiovascular: Normal rate, regular rhythm. Grossly normal heart sounds.  Good peripheral circulation. Respiratory: Normal respiratory effort.  No retractions. Lungs diminished bilaterally. Gastrointestinal: Soft and nontender. No distention. No abdominal bruits. No CVA tenderness. Musculoskeletal: No lower extremity tenderness nor edema.  No joint effusions. Neurologic:  Normal speech and language. No gross focal neurologic deficits are appreciated.  Skin:  Skin is warm, dry and intact. No rash noted. Psychiatric: Mood and affect are normal. Speech and behavior are normal.  ____________________________________________   LABS (all labs ordered are listed, but only  abnormal results are displayed)  Labs Reviewed  CBC - Abnormal; Notable for the following:       Result Value   WBC 12.9 (*)    RBC 3.72 (*)    Hemoglobin 11.6 (*)    HCT 33.0 (*)    All other components within normal limits  COMPREHENSIVE METABOLIC PANEL - Abnormal; Notable for the following:    Sodium 120 (*)    Chloride 87 (*)    Glucose, Bld 117 (*)    Albumin 3.4 (*)    All other components within normal limits  TROPONIN I  LIPASE, BLOOD   ____________________________________________  EKG  ED ECG REPORT I, SUNG,JADE J, the attending physician, personally viewed and interpreted this ECG.   Date: 03/02/2017  EKG Time: 2342  Rate: 95  Rhythm: normal EKG, normal sinus rhythm  Axis: Normal  Intervals:none  ST&T Change: Nonspecific  ____________________________________________  RADIOLOGY  Dg Chest 2 View  Result Date: 03/02/2017 CLINICAL DATA:  Acute onset of shortness of breath  and left chest pain, radiating to the back. Nausea and vomiting. Initial encounter. EXAM: CHEST  2 VIEW COMPARISON:  Chest radiograph and CTA of the chest performed 01/12/2017 FINDINGS: The lungs are well-aerated. Peribronchial thickening is noted. There is no evidence of focal opacification, pleural effusion or pneumothorax. The heart is normal in size; the mediastinal contour is within normal limits. No acute osseous abnormalities are seen. IMPRESSION: Peribronchial thickening noted.  Lungs otherwise grossly clear. Electronically Signed   By: Garald Balding M.D.   On: 03/02/2017 00:14    ____________________________________________   PROCEDURES  Procedure(s) performed: None  Procedures  Critical Care performed: No  ____________________________________________   INITIAL IMPRESSION / ASSESSMENT AND PLAN / ED COURSE  Pertinent labs & imaging results that were available during my care of the patient were reviewed by me and considered in my medical decision making (see chart for  details).  76 year old female with COPD who presents with generalized weakness, cough and shortness of breath. Laboratory results remarkable for hyponatremia which is worsened from previous. Will initiate IV fluid resuscitation, Solu-Medrol, nebulizer treatment and discuss with hospitalist to evaluate patient in the emergency department for admission.      ____________________________________________   FINAL CLINICAL IMPRESSION(S) / ED DIAGNOSES  Final diagnoses:  Shortness of breath  COPD exacerbation (HCC)  Chest pain, unspecified type  Hyponatremia  Weakness      NEW MEDICATIONS STARTED DURING THIS VISIT:  New Prescriptions   No medications on file     Note:  This document was prepared using Dragon voice recognition software and may include unintentional dictation errors.    Paulette Blanch, MD 03/02/17 [phone]

## 2017-03-02 NOTE — ED Notes (Signed)
Pt up to toilet with no difficulty

## 2017-03-02 NOTE — Progress Notes (Signed)
Initial Nutrition Assessment  DOCUMENTATION CODES:   Severe malnutrition in context of chronic illness  INTERVENTION:  1. Recommend Ensure Enlive po q24h, each supplement provides 350 kcal and 20 grams of protein 2. Snacks ordered for patient 3. Reinforced good PO intake, patient has been gaining weight, emphasized importance of protein.  NUTRITION DIAGNOSIS:   Malnutrition (Severe) related to chronic illness as evidenced by severe depletion of muscle mass, severe depletion of body fat.  GOAL:   Patient will meet greater than or equal to 90% of their needs  MONITOR:   PO intake, I & O's, Supplement acceptance, Labs, Weight trends  REASON FOR ASSESSMENT:   Malnutrition Screening Tool    ASSESSMENT:   Christina Hester has a PMHHx of  COPD, HTN, AAA without rupture, PVD, presents with cough, SOB, L chest pain, nausea/vomiting and generalized weakness.  Spoke with daughter at bedside. Patient recently received ativan. Daughter reports patient has gained weight recently, appetite and PO intake was good up until 3 days ago. She has gained 11 pounds over the past 9 months. Was eating a bagel or toast with a banana at breakfast, a large cooked meal for lunch consisting usually of a meat and 2 vegetables, and leftovers from that meal for dinner along with snacking on peanut butter crackers before bed and throughout the day. She also has ensure at home but does not drink it per daughter.  For 3 days prior she was eating very little, very tired, sleepy. Seems appetite has picked up some now, ate 100% of [place] breast with bread, potatoes, and banana pudding. Ate some of her green beans. No chewing/swallowing issues reported.  Nutrition-Focused physical exam completed. Findings are severe fat depletion, severe muscle depletion, and no edema.   Ordered peanut butter and crackers for patient in evening.  Labs reviewed:  Na 128 Medications reviewed and include:  Prednisone taper NS at  77mL/hr  Diet Order:  DIET SOFT Room service appropriate? Yes; Fluid consistency: Thin  Skin:  Reviewed, no issues  Last BM:  PTA  Height:   Ht Readings from Last 1 Encounters:  03/01/17 5\' 1"  (1.549 m)    Weight:   Wt Readings from Last 1 Encounters:  03/01/17 90 lb (40.8 kg)    Ideal Body Weight:  47.72 kg  BMI:  Body mass index is 17.01 kg/m.  Estimated Nutritional Needs:   Kcal:  4650-3546 calories (30-35 cal/kg)  Protein:  61-70 grams (1.5-1.7g/kg)  Fluid:  > 1.5L  EDUCATION NEEDS:   Education needs addressed  Christina Hester. Christina Leib, MS, RD LDN Inpatient Clinical Dietitian Pager [phone]

## 2017-03-02 NOTE — ED Notes (Signed)
ED Provider at bedside. 

## 2017-03-02 NOTE — Progress Notes (Signed)
Pt complained of burning in chest. When ask to point to where the burning was she indicated her throat and sternum region. VS taken and placed in chart. Dr notified.

## 2017-03-03 DIAGNOSIS — E860 Dehydration: Secondary | ICD-10-CM

## 2017-03-03 DIAGNOSIS — R079 Chest pain, unspecified: Secondary | ICD-10-CM

## 2017-03-03 DIAGNOSIS — J189 Pneumonia, unspecified organism: Secondary | ICD-10-CM

## 2017-03-03 DIAGNOSIS — D72829 Elevated white blood cell count, unspecified: Secondary | ICD-10-CM

## 2017-03-03 DIAGNOSIS — E871 Hypo-osmolality and hyponatremia: Secondary | ICD-10-CM

## 2017-03-03 DIAGNOSIS — J441 Chronic obstructive pulmonary disease with (acute) exacerbation: Secondary | ICD-10-CM

## 2017-03-03 DIAGNOSIS — J449 Chronic obstructive pulmonary disease, unspecified: Secondary | ICD-10-CM

## 2017-03-03 LAB — TROPONIN I
TROPONIN I: 0.06 ng/mL — AB (ref <0.03)
Troponin I: 0.44 ng/mL (ref <0.03)
Troponin I: 0.4 ng/mL (ref <0.03)

## 2017-03-03 LAB — SODIUM
SODIUM: 132 mmol/L — AB (ref 135–145)
Sodium: 131 mmol/L — ABNORMAL LOW (ref 135–145)

## 2017-03-03 LAB — TSH: TSH: 0.456 u[IU]/mL (ref 0.350–4.500)

## 2017-03-03 LAB — CBC
HCT: 29 % — ABNORMAL LOW (ref 35.0–47.0)
Hemoglobin: 10.2 g/dL — ABNORMAL LOW (ref 12.0–16.0)
MCH: 31.9 pg (ref 26.0–34.0)
MCHC: 35.1 g/dL (ref 32.0–36.0)
MCV: 91.1 fL (ref 80.0–100.0)
Platelets: 224 10*3/uL (ref 150–440)
RBC: 3.18 MIL/uL — ABNORMAL LOW (ref 3.80–5.20)
RDW: 12.6 % (ref 11.5–14.5)
WBC: 11.1 10*3/uL — AB (ref 3.6–11.0)

## 2017-03-03 LAB — APTT: aPTT: 72 seconds — ABNORMAL HIGH (ref 24–36)

## 2017-03-03 LAB — PROTIME-INR
INR: 1.13
Prothrombin Time: 14.6 seconds (ref 11.4–15.2)

## 2017-03-03 MED ORDER — AZITHROMYCIN 250 MG PO TABS: ORAL_TABLET | ORAL | 0 refills | Status: DC

## 2017-03-03 MED ORDER — TROLAMINE SALICYLATE 10 % EX CREA: TOPICAL_CREAM | CUTANEOUS | 0 refills | Status: DC | PRN

## 2017-03-03 MED ORDER — GUAIFENESIN ER 600 MG PO TB12: 600.0000 mg | ORAL_TABLET | Freq: Two times a day (BID) | ORAL | 0 refills | Status: DC

## 2017-03-03 MED ORDER — HYDROCOD POLST-CPM POLST ER 10-8 MG/5ML PO SUER
5.0000 mL | Freq: Two times a day (BID) | ORAL | 0 refills | Status: DC
Start: 2017-03-03 — End: 2017-03-06

## 2017-03-03 MED ORDER — AMLODIPINE BESYLATE 5 MG PO TABS
2.5000 mg | ORAL_TABLET | Freq: Every day | ORAL | Status: DC
  Administered 2017-03-03: 2.5 mg via ORAL
  Filled 2017-03-03: qty 1

## 2017-03-03 MED ORDER — NICOTINE 21 MG/24HR TD PT24
21.0000 mg | MEDICATED_PATCH | Freq: Every day | TRANSDERMAL | Status: DC
  Administered 2017-03-05: 21 mg via TRANSDERMAL
  Filled 2017-03-03 (×2): qty 1

## 2017-03-03 MED ORDER — IPRATROPIUM-ALBUTEROL 0.5-2.5 (3) MG/3ML IN SOLN
3.0000 mL | RESPIRATORY_TRACT | Status: DC
  Administered 2017-03-03 – 2017-03-06 (×8): 3 mL via RESPIRATORY_TRACT
  Filled 2017-03-03 (×8): qty 3

## 2017-03-03 MED ORDER — PREDNISONE 10 MG (21) PO TBPK: ORAL_TABLET | ORAL | 0 refills | Status: DC

## 2017-03-03 MED ORDER — METOPROLOL TARTRATE 25 MG PO TABS
25.0000 mg | ORAL_TABLET | Freq: Once | ORAL | Status: AC
  Administered 2017-03-03: 25 mg via ORAL
  Filled 2017-03-03: qty 1

## 2017-03-03 MED ORDER — CEFDINIR 300 MG PO CAPS: 300.0000 mg | ORAL_CAPSULE | Freq: Two times a day (BID) | ORAL | 0 refills | Status: DC

## 2017-03-03 MED ORDER — SODIUM CHLORIDE 0.9 % IV SOLN
INTRAVENOUS | Status: DC
  Administered 2017-03-03 – 2017-03-05 (×4): via INTRAVENOUS

## 2017-03-03 MED ORDER — METOPROLOL TARTRATE 25 MG PO TABS
25.0000 mg | ORAL_TABLET | Freq: Two times a day (BID) | ORAL | Status: DC
  Administered 2017-03-04 – 2017-03-06 (×4): 25 mg via ORAL
  Filled 2017-03-03 (×4): qty 1

## 2017-03-03 MED ORDER — HEPARIN (PORCINE) IN NACL 100-0.45 UNIT/ML-% IJ SOLN
850.0000 [IU]/h | INTRAMUSCULAR | Status: DC
  Administered 2017-03-03: 500 [IU]/h via INTRAVENOUS
  Filled 2017-03-03 (×2): qty 250

## 2017-03-03 MED ORDER — ASPIRIN EC 325 MG PO TBEC
325.0000 mg | DELAYED_RELEASE_TABLET | Freq: Every day | ORAL | Status: DC
  Administered 2017-03-03: 325 mg via ORAL
  Filled 2017-03-03: qty 1

## 2017-03-03 MED ORDER — IPRATROPIUM-ALBUTEROL 0.5-2.5 (3) MG/3ML IN SOLN: 3.0000 mL | RESPIRATORY_TRACT | 3 refills | Status: DC

## 2017-03-03 MED ORDER — HEPARIN BOLUS VIA INFUSION
2450.0000 [IU] | Freq: Once | INTRAVENOUS | Status: AC
  Administered 2017-03-03: 2450 [IU] via INTRAVENOUS
  Filled 2017-03-03: qty 2450

## 2017-03-03 MED ORDER — NITROGLYCERIN 2 % TD OINT
0.5000 [in_us] | TOPICAL_OINTMENT | Freq: Four times a day (QID) | TRANSDERMAL | Status: DC
  Administered 2017-03-03 – 2017-03-06 (×10): 0.5 [in_us] via TOPICAL
  Filled 2017-03-03: qty 30
  Filled 2017-03-03 (×4): qty 0.5

## 2017-03-03 MED ORDER — BUDESONIDE-FORMOTEROL FUMARATE 160-4.5 MCG/ACT IN AERO: 2.0000 | INHALATION_SPRAY | Freq: Two times a day (BID) | RESPIRATORY_TRACT | 12 refills | Status: DC

## 2017-03-03 MED ORDER — AMLODIPINE BESYLATE 2.5 MG PO TABS: 2.5000 mg | ORAL_TABLET | Freq: Every day | ORAL | 3 refills | Status: DC

## 2017-03-03 MED ORDER — NICOTINE 21 MG/24HR TD PT24: 21.0000 mg | MEDICATED_PATCH | Freq: Every day | TRANSDERMAL | 0 refills | Status: DC

## 2017-03-03 MED ORDER — METOPROLOL TARTRATE 25 MG PO TABS
25.0000 mg | ORAL_TABLET | Freq: Two times a day (BID) | ORAL | Status: DC
  Administered 2017-03-03: 25 mg via ORAL
  Filled 2017-03-03: qty 1

## 2017-03-03 MED ORDER — ENSURE ENLIVE PO LIQD: 237.0000 mL | ORAL | 12 refills | Status: DC

## 2017-03-03 NOTE — Progress Notes (Deleted)
Patient is grimacing when left wrist is touched/moved. Palpated left elbow and shoulder area, patient doesn't grimace, but grimaces with wrist. Dr. Ether Griffins paged. MD will come assess. Continue to monitor.

## 2017-03-03 NOTE — Discharge Summary (Signed)
[facility] [place] NAME: Christina Hester    MR#:  [id number]  DATE OF BIRTH:  [birth year]  DATE OF ADMISSION:  03/02/2017 ADMITTING PHYSICIAN: Harrie Foreman, MD  DATE OF DISCHARGE: No discharge date for patient encounter.  PRIMARY CARE PHYSICIAN: Ronnell Freshwater, NP     ADMISSION DIAGNOSIS:  Shortness of breath [R06.02] Hyponatremia [E87.1] Weakness [R53.1] COPD exacerbation (HCC) [J44.1] Chest pain, unspecified type [R07.9]  DISCHARGE DIAGNOSIS:  Active Problems:   Acute on chronic respiratory failure (HCC)   COPD with acute exacerbation (HCC)   Left sided chest pain   Multifocal pneumonia   Hyponatremia   Dehydration   Leukocytosis   SECONDARY DIAGNOSIS:   Past Medical History:  Diagnosis Date  . AAA (abdominal aortic aneurysm) ([place])   . COPD (chronic obstructive pulmonary disease) ([place])   . Hypertension     .pro HOSPITAL COURSE:   The patient is 76 year old Caucasian female with past medical history significant for history of COPD, hypertension, abdominal aortic aneurysm, who presented to the hospital with complaints of nonproductive cough, shortness of breath, left-sided chest pain, nausea and vomiting, generalized weakness, going on for the past 1-2 days. On arrival to the hospital, patient's x-ray revealed peribronchial changes, otherwise no significant abnormalities, CT angiogram showed multifocal pneumonia in the lingula and bilateral anterior lobes, emphysema. Patient was initiated on steroids, nebulizing therapy, antibiotic therapy, her condition improved clinically and she was felt to be stable to be discharged home. Her sodium level, which was found to be very low on admission, improved with IV fluid administration, signifying likely dehydration. Speech therapist evaluated patient and felt that patient had mild dysphagia, oropharyngeal phase, recommended to continue soft diet with aspiration  precautions. Patient's oxygenation improved, she was on room air at 95-96% on the day of discharge Discussion by problem: #1. Acute respiratory failure with hypoxia, patient was  weaned off oxygen , now on room air, O2 sats is stable at 95-96%, patient will be ambulated prior to the discharge to ensure oxygenation stability. #2. COPD exacerbation due to pneumonia, taper oral steroids, continue Tiotropium, nebulizing therapy every 4 hours, improved clinically #3. Left-sided chest pain, no pulmonary embolism, improved on  Tussionex, supportive therapy #4. Bacterial pneumonia, the patient was managed on Rocephin and Zithromax while in the hospital, she is being discharged on  Cefdinir and Zithromax, to continue these medications for 5 more days, unable to get sputum , however, patient improved clinically, appreciate speech therapist input, aspiration precautions were recommended and soft diet #5. Essential hypertension, patient is initiated on Norvasc, to be advanced as needed, ACE inhibitor has been stopped due to allergy  #6. Hyponatremia, improved on IV fluid administration, likely dehydration related. #7 tobacco abuse. Counseling, discussed this patient for 4 minutes in the past, nicotine replacement therapy will be continued, she is agreeable llow-up as outpatient #8. Leukocytosis, some improved, follow after prednisone taper as outpatient  DISCHARGE CONDITIONS:   Stable  CONSULTS OBTAINED:    DRUG ALLERGIES:   Allergies  Allergen Reactions  . Aspirin Tinitus  . Prednisone Other (See Comments)    Makes violent  . Sulfa Antibiotics Nausea And Vomiting  . Symbicort [Budesonide-Formoterol Fumarate] Other (See Comments)    Patient felt "out of her mind" and angry.    DISCHARGE MEDICATIONS:   Current Discharge Medication List    START taking these medications   Details  amLODipine (NORVASC) 2.5 MG tablet Take 1 tablet (2.5 mg  total) by mouth daily. Qty: 30 tablet, Refills: 3      azithromycin (ZITHROMAX) 250 MG tablet One pill once daily until finished, thank you Qty: 6 each, Refills: 0    budesonide-formoterol (SYMBICORT) 160-4.5 MCG/ACT inhaler Inhale 2 puffs into the lungs 2 (two) times daily. Qty: 1 Inhaler, Refills: 12    cefdinir (OMNICEF) 300 MG capsule Take 1 capsule (300 mg total) by mouth 2 (two) times daily. Qty: 10 capsule, Refills: 0    chlorpheniramine-HYDROcodone (TUSSIONEX) 10-8 MG/5ML SUER Take 5 mLs by mouth every 12 (twelve) hours. Qty: 140 mL, Refills: 0    feeding supplement, ENSURE ENLIVE, (ENSURE ENLIVE) LIQD Take 237 mLs by mouth daily. Qty: 237 mL, Refills: 12    guaiFENesin (MUCINEX) 600 MG 12 hr tablet Take 1 tablet (600 mg total) by mouth 2 (two) times daily. Qty: 20 tablet, Refills: 0    nicotine (NICODERM CQ - DOSED IN MG/24 HOURS) 21 mg/24hr patch Place 1 patch (21 mg total) onto the skin daily. Qty: 28 patch, Refills: 0    predniSONE (STERAPRED UNI-PAK 21 TAB) 10 MG (21) TBPK tablet Please take 6 pills in the morning on the day 1, then taper by one pill daily until finished, thank you Qty: 21 tablet, Refills: 0    trolamine salicylate (ASPERCREME) 10 % cream Apply topically as needed for muscle pain. Qty: 85 g, Refills: 0      CONTINUE these medications which have CHANGED   Details  ipratropium-albuterol (DUONEB) 0.5-2.5 (3) MG/3ML SOLN Take 3 mLs by nebulization every 4 (four) hours. Qty: 360 mL, Refills: 3      CONTINUE these medications which have NOT CHANGED   Details  albuterol (PROVENTIL HFA;VENTOLIN HFA) 108 (90 Base) MCG/ACT inhaler Inhale 2 puffs into the lungs every 6 (six) hours as needed for wheezing or shortness of breath.    cetirizine (ZYRTEC) 10 MG tablet Take 1 tablet by mouth daily.    tiotropium (SPIRIVA) 18 MCG inhalation capsule Place 18 mcg into inhaler and inhale daily.    Omega-3 Fatty Acids (FISH OIL PO) Take 1 capsule by mouth daily.      STOP taking these medications     lisinopril  (PRINIVIL,ZESTRIL) 2.5 MG tablet          DISCHARGE INSTRUCTIONS:    The patient is to follow-up with primary care physician within one week after discharge  If you experience worsening of your admission symptoms, develop shortness of breath, life threatening emergency, suicidal or homicidal thoughts you must seek medical attention immediately by calling 911 or calling your MD immediately  if symptoms less severe.  You Must read complete instructions/literature along with all the possible adverse reactions/side effects for all the Medicines you take and that have been prescribed to you. Take any new Medicines after you have completely understood and accept all the possible adverse reactions/side effects.   Please note  You were cared for by a hospitalist during your hospital stay. If you have any questions about your discharge medications or the care you received while you were in the hospital after you are discharged, you can call the unit and asked to speak with the hospitalist on call if the hospitalist that took care of you is not available. Once you are discharged, your primary care physician will handle any further medical issues. Please note that NO REFILLS for any discharge medications will be authorized once you are discharged, as it is imperative that you return to your primary care physician (  or establish a relationship with a primary care physician if you do not have one) for your aftercare needs so that they can reassess your need for medications and monitor your lab values.    Today   CHIEF COMPLAINT:   Chief Complaint  Patient presents with  . Shortness of Breath  . Chest Pain  . Emesis    HISTORY OF PRESENT ILLNESS:  Christina Hester  is a 76 y.o. female with a known history of    VITAL SIGNS:  Blood pressure (!) 159/68, pulse 77, temperature 98.7 F (37.1 C), temperature source Oral, resp. rate 17, height 5\' 1"  (1.549 m), weight 40.8 kg (90 lb), SpO2 95 %.  I/O:     Intake/Output Summary (Last 24 hours) at 03/03/17 1120 Last data filed at 03/03/17 1014  Gross per 24 hour  Intake          1514.17 ml  Output                0 ml  Net          1514.17 ml    PHYSICAL EXAMINATION:  GENERAL:  76 y.o.-year-old patient lying in the bed with no acute distress.  EYES: Pupils equal, round, reactive to light and accommodation. No scleral icterus. Extraocular muscles intact.  HEENT: Head atraumatic, normocephalic. Oropharynx and nasopharynx clear.  NECK:  Supple, no jugular venous distention. No thyroid enlargement, no tenderness.  LUNGS:Mildly diminished breath sounds but actually, some scattered wheezing,no  rales,rhonchi or crepitation. No use of accessory muscles of respiration.  CARDIOVASCULAR: S1, S2 normal. No murmurs, rubs, or gallops.  ABDOMEN: Soft, non-tender, non-distended. Bowel sounds present. No organomegaly or mass.  EXTREMITIES: No pedal edema, cyanosis, or clubbing.  NEUROLOGIC: Cranial nerves II through XII are intact. Muscle strength 5/5 in all extremities. Sensation intact. Gait not checked.  PSYCHIATRIC: The patient is alert and oriented x 3.  SKIN: No obvious rash, lesion, or ulcer.   DATA REVIEW:   CBC  Recent Labs Lab 03/03/17 0351  WBC 11.1*  HGB 10.2*  HCT 29.0*  PLT 224    Chemistries   Recent Labs Lab 03/01/17 2347 03/02/17 0747 03/03/17 0351  NA 120* 128* 131*  K 4.1 4.4  --   CL 87* 94*  --   CO2 24 27  --   GLUCOSE 117* 101*  --   BUN 7 6  --   CREATININE 0.57 0.68  --   CALCIUM 8.9 8.9  --   AST 23  --   --   ALT 14  --   --   ALKPHOS 90  --   --   BILITOT 0.8  --   --     Cardiac Enzymes  Recent Labs Lab 03/01/17 2347  TROPONINI <0.03    Microbiology Results  Results for orders placed or performed during the hospital encounter of 03/02/17  Culture, expectorated sputum-assessment     Status: None   Collection Time: 03/02/17  9:22 PM  Result Value Ref Range Status   Specimen Description  SPU  Final   Special Requests NONE  Final   Sputum evaluation   Final    Sputum specimen not acceptable for testing.  Please recollect.   C/TRACEY TOOMBS AT 2312 03/02/17.PMH   Report Status 03/02/2017 FINAL  Final    RADIOLOGY:  Dg Chest 2 View  Result Date: 03/02/2017 CLINICAL DATA:  Acute onset of shortness of breath and left chest pain, radiating to the back. Nausea  and vomiting. Initial encounter. EXAM: CHEST  2 VIEW COMPARISON:  Chest radiograph and CTA of the chest performed 01/12/2017 FINDINGS: The lungs are well-aerated. Peribronchial thickening is noted. There is no evidence of focal opacification, pleural effusion or pneumothorax. The heart is normal in size; the mediastinal contour is within normal limits. No acute osseous abnormalities are seen. IMPRESSION: Peribronchial thickening noted.  Lungs otherwise grossly clear. Electronically Signed   By: Garald Balding M.D.   On: 03/02/2017 00:14   Ct Angio Chest Pe W Or Wo Contrast  Result Date: 03/02/2017 CLINICAL DATA:  Left-sided chest pain and shortness of breath for 3 days. EXAM: CT ANGIOGRAPHY CHEST WITH CONTRAST TECHNIQUE: Multidetector CT imaging of the chest was performed using the standard protocol during bolus administration of intravenous contrast. Multiplanar CT image reconstructions and MIPs were obtained to evaluate the vascular anatomy. CONTRAST:  75 cc Isovue 370 intravenous contrast. COMPARISON:  Chest CT dated January 12, 2017. FINDINGS: Cardiovascular: Satisfactory opacification of the pulmonary arteries to the segmental level. No evidence of pulmonary embolism. Normal heart size. No pericardial effusion. No thoracic aortic aneurysm. Coronary, aortic arch, and branch vessel atherosclerotic vascular disease. Mediastinum/Nodes: No enlarged mediastinal, hilar, or axillary lymph nodes. Thyroid gland, trachea, and esophagus demonstrate no significant findings. Lungs/Pleura: New patchy areas of predominantly centrilobular nodularity in  the anterior and apical right upper lobe, superior and basal right lower lobe, anterior left upper lobe, to lesser extent the left lower lobe. More focal areas of consolidation in the bilateral anterior upper lobes. Consolidation in the lingula is again noted. No pleural effusion or pneumothorax. Moderate upper lobe predominant centrilobular emphysematous change, similar to prior study. Upper Abdomen: No acute abnormality. Musculoskeletal: No chest wall abnormality. No acute or significant osseous findings. Review of the MIP images confirms the above findings. IMPRESSION: 1. No evidence of pulmonary embolism. 2. New patchy areas of predominantly centrilobular nodularity in both lungs, with more focal small areas of consolidation in the lingula and bilateral anterior upper lobes. Findings are favored to represent multifocal pneumonia. 3.  Emphysema (ICD10-J43.9). 4.  Aortic atherosclerosis (ICD10-I70.0). Electronically Signed   By: Titus Dubin M.D.   On: 03/02/2017 10:38    EKG:   Orders placed or performed during the hospital encounter of 03/02/17  . ED EKG within 10 minutes  . ED EKG within 10 minutes  . EKG 12-Lead  . EKG 12-Lead      Management plans discussed with the patient, family and they are in agreement.  CODE STATUS:  Code Status History    Date Active Date Inactive Code Status Order ID Comments User Context   07/18/2016  6:24 PM 07/20/2016  8:41 PM Full Code [id number]  Vaughan Basta, MD ED   04/28/2016  1:56 AM 04/29/2016  1:28 PM Full Code [id number]  Harvie Bridge, DO Inpatient   11/25/2015  7:33 PM 11/26/2015  3:19 PM Full Code [id number]  Nicholes Mango, MD Inpatient      TOTAL TIME TAKING CARE OF THIS PATIENT: 40  minutes.    Theodoro Grist M.D on 03/03/2017 at 11:20 AM  Between 7am to 6pm - Pager - [phone]  After 6pm go to [email or web address] - password [email or web address] [facility]  Office  [phone]  CC: Primary care physician; Ronnell Freshwater, NP

## 2017-03-03 NOTE — Progress Notes (Signed)
Prescription for tussionex was given to patient earlier today for a possible discharge home. No discharge home today. Prescription still in patient's possession, states she will hold on to it until tomorrow for possible discharge.

## 2017-03-03 NOTE — Progress Notes (Signed)
Ambulated patient around nurses station once. O2 sats maintained 94-96% on room air. Heart rate 99-106. MD notified.

## 2017-03-03 NOTE — Progress Notes (Signed)
CRITICAL VALUE ALERT  Critical Value:  Troponin 0.44  Date & Time Notied:  03/03/2017 @ 653pm  Provider Notified: Edwina Barth  Orders Received/Actions taken: MD will place orders

## 2017-03-03 NOTE — Progress Notes (Signed)
Patient troponin 0.40, heparin infusing, MD notified. No new orders received.

## 2017-03-03 NOTE — Consult Note (Signed)
ANTICOAGULATION CONSULT NOTE - Initial Consult  Pharmacy Consult for heparin drip Indication: chest pain/ACS  Allergies  Allergen Reactions  . Aspirin Tinitus  . Prednisone Other (See Comments)    Makes violent  . Sulfa Antibiotics Nausea And Vomiting  . Symbicort [Budesonide-Formoterol Fumarate] Other (See Comments)    Patient felt "out of her mind" and angry.    Patient Measurements: Height: 5\' 1"  (154.9 cm) Weight: 90 lb (40.8 kg) IBW/kg (Calculated) : 47.8 Heparin Dosing Weight: 40.8kg  Vital Signs: Temp: 98 F (36.7 C) (08/18 1332) Temp Source: Oral (08/18 1332) BP: 141/56 (08/18 1332) Pulse Rate: 102 (08/18 1332)  Labs:  Recent Labs  03/01/17 2347 03/02/17 0747 03/03/17 0351 03/03/17 1341 03/03/17 1721  HGB 11.6*  --  10.2*  --   --   HCT 33.0*  --  29.0*  --   --   PLT 237  --  224  --   --   CREATININE 0.57 0.68  --   --   --   TROPONINI <0.03  --   --  0.06* 0.44*    Estimated Creatinine Clearance: 39.1 mL/min (by C-G formula based on SCr of 0.68 mg/dL).   Medical History: Past Medical History:  Diagnosis Date  . AAA (abdominal aortic aneurysm) ([place])   . COPD (chronic obstructive pulmonary disease) ([place])   . Hypertension     Medications:  Scheduled:  . aspirin EC  325 mg Oral Daily  . azithromycin  250 mg Oral Daily  . chlorpheniramine-HYDROcodone  5 mL Oral Q12H  . feeding supplement (ENSURE ENLIVE)  237 mL Oral Q24H  . guaiFENesin  600 mg Oral BID  . heparin  2,450 Units Intravenous Once  . ipratropium-albuterol  3 mL Nebulization Q4H WA  . loratadine  10 mg Oral Daily  . methylPREDNISolone (SOLU-MEDROL) injection  125 mg Intravenous Once  . metoprolol tartrate  25 mg Oral BID  . multivitamin  15 mL Oral Daily  . nicotine  21 mg Transdermal Daily  . nitroGLYCERIN  0.5 inch Topical Q6H  . [START ON 03/04/2017] predniSONE  30 mg Oral Q breakfast   Followed by  . [START ON 03/05/2017] predniSONE  20 mg Oral Q breakfast   Followed by  .  [START ON 03/06/2017] predniSONE  10 mg Oral Q breakfast   Followed by  . [START ON 03/07/2017] predniSONE  5 mg Oral Q breakfast  . tiotropium  18 mcg Inhalation Daily    Assessment: Pt is a 76 year old female who was admitted with SOB, chest pain, hypokalemia, weakness. Was about to be d/c today when she complained of further chest pain/anxiety. Pt troponins continue to elevated, second reading was 0.44. Pharmacy consulted to dose heparin drip.  Goal of Therapy:  Heparin level 0.3-0.7 units/ml Monitor platelets by anticoagulation protocol: Yes   Plan:  Give 2450 units bolus x 1 Start heparin infusion at 500 units/hr Check anti-Xa level in 8 hours and daily while on heparin Continue to monitor H&H and platelets  Katya Rolston D Janelly Switalski, Pharm.D, BCPS Clinical Pharmacist  03/03/2017,7:10 PM

## 2017-03-03 NOTE — Evaluation (Signed)
Clinical/Bedside Swallow Evaluation Patient Details  Name: Christina Hester MRN: [id number] Date of Birth: 1940-11-14  Today's Date: 03/03/2017 Time: SLP Start Time (ACUTE ONLY): 8242 SLP Stop Time (ACUTE ONLY): 0925 SLP Time Calculation (min) (ACUTE ONLY): 32 min  Past Medical History:  Past Medical History:  Diagnosis Date  . AAA (abdominal aortic aneurysm) ([place])   . COPD (chronic obstructive pulmonary disease) ([place])   . Hypertension    Past Surgical History:  Past Surgical History:  Procedure Laterality Date  . ABDOMINAL HYSTERECTOMY    . BREAST EXCISIONAL BIOPSY Left 20+ YRS AGO   NEG   HPI:      Assessment / Plan / Recommendation Clinical Impression  pt presents at a mild risk of aspiration as characterized by functional mastication and no coughing with solids or thin liquids via cup. pt was noted to have multiple instances of cough with straw, large and small straws, with thin liquids. pt stated she typically does not use straws. slp recommended to dc use of straws and pt and nsg agree.  slp will follow up to ensure safety wtih current diet. Continue current diet.  SLP Visit Diagnosis: Dysphagia, oropharyngeal phase (R13.12)    Aspiration Risk  Mild aspiration risk    Diet Recommendation Regular;Thin liquid   Liquid Administration via: No straw;Cup;Spoon Medication Administration: Whole meds with puree Supervision: Patient able to self feed Compensations: Slow rate;Small sips/bites;Follow solids with liquid Postural Changes: Seated upright at 90 degrees    Other  Recommendations Oral Care Recommendations: Patient independent with oral care   Follow up Recommendations        Frequency and Duration min 3x week  1 week       Prognosis Prognosis for Safe Diet Advancement: Good      Swallow Study   General Date of Onset: 03/03/17 Type of Study: Bedside Swallow Evaluation Diet Prior to this Study: Dysphagia 3 (soft);Thin liquids Temperature Spikes Noted:  No Respiratory Status: Room air History of Recent Intubation: No Behavior/Cognition: Alert;Cooperative;Pleasant mood Oral Cavity Assessment: Within Functional Limits Oral Care Completed by SLP: No Oral Cavity - Dentition: Adequate natural dentition;Missing dentition Vision: Functional for self-feeding Self-Feeding Abilities: Able to feed self Patient Positioning: Upright in bed Baseline Vocal Quality: Normal Volitional Cough: Strong Volitional Swallow: Able to elicit    Oral/Motor/Sensory Function Overall Oral Motor/Sensory Function: Within functional limits   Ice Chips Ice chips: Within functional limits Presentation: Self Fed;Spoon   Thin Liquid Thin Liquid: Impaired Presentation: Self Fed;Spoon;Straw Pharyngeal  Phase Impairments: Cough - Delayed Other Comments: cough was only with straw- recommend no straw.    Nectar Thick Nectar Thick Liquid: Not tested   Honey Thick Honey Thick Liquid: Not tested   Puree Puree: Within functional limits Presentation: Self Fed;Spoon   Solid   GO   Solid: Within functional limits Presentation: Spoon;Self Fed    Functional Limitations: Swallowing Swallow Current Status ([id number]): At least 20 percent but less than 40 percent impaired, limited or restricted Swallow Goal Status [id number]): At least 1 percent but less than 20 percent impaired, limited or restricted   [employer] 03/03/2017,10:48 AM

## 2017-03-03 NOTE — Progress Notes (Signed)
Patient felt anxious earlier, heart rate elevated, complaining of chest pain, and indigestion. Dr Ether Griffins notified, ordered metoprolol, ekg, aspirin, troponin x3 q4h, nitro paste. Patient is resting in bed comfortably watching tv at the moment. Ativan given PO with relief. Maalox given PO with relief. Daughter at bedside. Continue to monitor.

## 2017-03-03 NOTE — Progress Notes (Signed)
CRITICAL VALUE ALERT  Critical Value:  Troponin 0.06  Date & Time Notied:  03/03/2017 @ 249pm  Provider Notified: Ether Griffins  Orders Received/Actions taken: Continue to monitor, wait for next trop

## 2017-03-03 NOTE — Progress Notes (Signed)
Patient a&o, vss. Oxygen stable with no O2 supplementation. Family member at bedside. Plan to ambulate patient after breakfast and check O2 sats. Continue to monitor.

## 2017-03-03 NOTE — Progress Notes (Signed)
Patient ID: Christina Hester, female   DOB: 1941-03-19, 76 y.o.   MRN: [id number]   Called by nurse with report on second troponin. Troponin continues to elevate is up to 0.44. Patient is asymptomatic and heart rate is down in the 80s. Currently on aspirin and beta blocker. Since troponin is still rising go ahead and start heparin drip.

## 2017-03-04 DIAGNOSIS — I200 Unstable angina: Secondary | ICD-10-CM

## 2017-03-04 DIAGNOSIS — I248 Other forms of acute ischemic heart disease: Secondary | ICD-10-CM

## 2017-03-04 DIAGNOSIS — J441 Chronic obstructive pulmonary disease with (acute) exacerbation: Secondary | ICD-10-CM

## 2017-03-04 DIAGNOSIS — R079 Chest pain, unspecified: Secondary | ICD-10-CM

## 2017-03-04 DIAGNOSIS — R002 Palpitations: Secondary | ICD-10-CM | POA: Diagnosis not present

## 2017-03-04 DIAGNOSIS — J9620 Acute and chronic respiratory failure, unspecified whether with hypoxia or hypercapnia: Secondary | ICD-10-CM

## 2017-03-04 LAB — HEPARIN LEVEL (UNFRACTIONATED)
Heparin Unfractionated: <0.1 IU/mL — ABNORMAL LOW (ref 0.30–0.70)
Heparin Unfractionated: 0.25 IU/mL — ABNORMAL LOW (ref 0.30–0.70)

## 2017-03-04 LAB — CBC
HEMATOCRIT: 30.6 % — AB (ref 35.0–47.0)
Hemoglobin: 10.6 g/dL — ABNORMAL LOW (ref 12.0–16.0)
MCH: 31.6 pg (ref 26.0–34.0)
MCHC: 34.8 g/dL (ref 32.0–36.0)
MCV: 90.7 fL (ref 80.0–100.0)
PLATELETS: 285 10*3/uL (ref 150–440)
RBC: 3.37 MIL/uL — ABNORMAL LOW (ref 3.80–5.20)
RDW: 13.1 % (ref 11.5–14.5)
WBC: 12.8 10*3/uL — AB (ref 3.6–11.0)

## 2017-03-04 MED ORDER — HEPARIN BOLUS VIA INFUSION
1200.0000 [IU] | Freq: Once | INTRAVENOUS | Status: AC
  Administered 2017-03-04: 1200 [IU] via INTRAVENOUS
  Filled 2017-03-04: qty 1200

## 2017-03-04 MED ORDER — ASPIRIN 81 MG PO CHEW
81.0000 mg | CHEWABLE_TABLET | ORAL | Status: AC
  Administered 2017-03-05: 81 mg via ORAL
  Filled 2017-03-04: qty 1

## 2017-03-04 MED ORDER — HEPARIN BOLUS VIA INFUSION
1300.0000 [IU] | Freq: Once | INTRAVENOUS | Status: AC
  Administered 2017-03-04: 1300 [IU] via INTRAVENOUS
  Filled 2017-03-04: qty 1300

## 2017-03-04 MED ORDER — SODIUM CHLORIDE 0.9 % IV SOLN: INTRAVENOUS | Status: DC

## 2017-03-04 MED ORDER — ATORVASTATIN CALCIUM 20 MG PO TABS
80.0000 mg | ORAL_TABLET | Freq: Every day | ORAL | Status: DC
  Administered 2017-03-04 – 2017-03-05 (×2): 80 mg via ORAL
  Filled 2017-03-04 (×2): qty 4

## 2017-03-04 MED ORDER — ASPIRIN EC 81 MG PO TBEC
81.0000 mg | DELAYED_RELEASE_TABLET | Freq: Every day | ORAL | Status: DC
  Administered 2017-03-04 – 2017-03-06 (×3): 81 mg via ORAL
  Filled 2017-03-04 (×3): qty 1

## 2017-03-04 NOTE — Consult Note (Signed)
ANTICOAGULATION CONSULT NOTE - Initial Consult  Pharmacy Consult for heparin drip Indication: chest pain/ACS  Allergies  Allergen Reactions  . Aspirin Tinitus  . Prednisone Other (See Comments)    Makes violent  . Sulfa Antibiotics Nausea And Vomiting  . Symbicort [Budesonide-Formoterol Fumarate] Other (See Comments)    Patient felt "out of her mind" and angry.    Patient Measurements: Height: 5\' 1"  (154.9 cm) Weight: 90 lb (40.8 kg) IBW/kg (Calculated) : 47.8 Heparin Dosing Weight: 40.8kg  Vital Signs: Temp: 98.2 F (36.8 C) (08/19 0333) Temp Source: Oral (08/19 0333) BP: 163/70 (08/19 0333) Pulse Rate: 69 (08/19 0333)  Labs:  Recent Labs  03/01/17 2347 03/02/17 0747 03/03/17 0351 03/03/17 1341 03/03/17 1721 03/03/17 2131 03/04/17 0339  HGB 11.6*  --  10.2*  --   --   --   --   HCT 33.0*  --  29.0*  --   --   --   --   PLT 237  --  224  --   --   --   --   APTT  --   --   --   --   --  72*  --   LABPROT  --   --   --   --   --  14.6  --   INR  --   --   --   --   --  1.13  --   HEPARINUNFRC  --   --   --   --   --   --  <0.10*  CREATININE 0.57 0.68  --   --   --   --   --   TROPONINI <0.03  --   --  0.06* 0.44* 0.40*  --     Estimated Creatinine Clearance: 39.1 mL/min (by C-G formula based on SCr of 0.68 mg/dL).   Medical History: Past Medical History:  Diagnosis Date  . AAA (abdominal aortic aneurysm) ([place])   . COPD (chronic obstructive pulmonary disease) ([place])   . Hypertension     Medications:  Scheduled:  . aspirin EC  325 mg Oral Daily  . azithromycin  250 mg Oral Daily  . chlorpheniramine-HYDROcodone  5 mL Oral Q12H  . feeding supplement (ENSURE ENLIVE)  237 mL Oral Q24H  . guaiFENesin  600 mg Oral BID  . heparin  1,200 Units Intravenous Once  . ipratropium-albuterol  3 mL Nebulization Q4H WA  . loratadine  10 mg Oral Daily  . methylPREDNISolone (SOLU-MEDROL) injection  125 mg Intravenous Once  . metoprolol tartrate  25 mg Oral BID  .  multivitamin  15 mL Oral Daily  . nicotine  21 mg Transdermal Daily  . nitroGLYCERIN  0.5 inch Topical Q6H  . predniSONE  30 mg Oral Q breakfast   Followed by  . [START ON 03/05/2017] predniSONE  20 mg Oral Q breakfast   Followed by  . [START ON 03/06/2017] predniSONE  10 mg Oral Q breakfast   Followed by  . [START ON 03/07/2017] predniSONE  5 mg Oral Q breakfast  . tiotropium  18 mcg Inhalation Daily    Assessment: Pt is a 76 year old female who was admitted with SOB, chest pain, hypokalemia, weakness. Was about to be d/c today when she complained of further chest pain/anxiety. Pt troponins continue to elevated, second reading was 0.44. Pharmacy consulted to dose heparin drip.  Goal of Therapy:  Heparin level 0.3-0.7 units/ml Monitor platelets by anticoagulation protocol: Yes  Plan:  Give 2450 units bolus x 1 Start heparin infusion at 500 units/hr Check anti-Xa level in 8 hours and daily while on heparin Continue to monitor H&H and platelets   8/19 @ 0339 HL < 0.10 subtherapeutic. Will rebolus w/ heparin 1200 units IV x 1 and increase rate to 600 units/hr and will recheck HL @ 1100. CBC appears to be trending down, will monitor w/ am labs.  Tobie Lords, PharmD, BCPS Clinical Pharmacist 03/04/2017,4:52 AM

## 2017-03-04 NOTE — Progress Notes (Signed)
[facility] [place] NAME: Christina Hester    MR#:  [id number]  DATE OF BIRTH:  [birth year]  SUBJECTIVE:  CHIEF COMPLAINT:   Chief Complaint  Patient presents with  . Shortness of Breath  . Chest Pain  . Emesis   Patient is 76 year old Caucasian female with past medical history significant for history of nonproductive cough, shortness of breath, left-sided chest pain, nausea and vomiting, generalized weakness for about 1 day. Patient's chest x-ray revealed bronchitis, CT angiogram of the chest revealed multifocal pneumonia. Patient is initiated on Rocephin and Zithromax and admitted. The patient was about to be discharged home, however, developed significant left-sided chest pain eft arm and back, troponins came back elevated The patient was initiated on metoprolol, nitroglycerin, aspirin, heparin  ntravenously. Cardiology consultation is requested, pending. Patient is not complaining of chest pains today, feels relatively good, however, admits of shortness of breath on exertion.  Review of Systems  Constitutional: Negative for chills, fever and weight loss.  HENT: Negative for congestion.   Eyes: Negative for blurred vision and double vision.  Respiratory: Positive for cough, sputum production and shortness of breath. Negative for wheezing.   Cardiovascular: Negative for chest pain, palpitations, orthopnea, leg swelling and PND.  Gastrointestinal: Negative for abdominal pain, blood in stool, constipation, diarrhea, nausea and vomiting.  Genitourinary: Negative for dysuria, frequency, hematuria and urgency.  Musculoskeletal: Negative for falls.  Neurological: Negative for dizziness, tremors, focal weakness and headaches.  Endo/Heme/Allergies: Does not bruise/bleed easily.  Psychiatric/Behavioral: Negative for depression. The patient does not have insomnia.     VITAL SIGNS: Blood pressure (!) 165/84, pulse 81, temperature 98.3 F (36.8  C), temperature source Oral, resp. rate 18, height 5\' 1"  (1.549 m), weight 40.8 kg (90 lb), SpO2 95 %.  PHYSICAL EXAMINATION:   GENERAL:  76 y.o.-year-old patient lying in the bed in mild respiratory distress, pursed lip breathing.  EYES: Pupils equal, round, reactive to light and accommodation. No scleral icterus. Extraocular muscles intact.  HEENT: Head atraumatic, normocephalic. Oropharynx and nasopharynx clear.  NECK:  Supple, no jugular venous distention. No thyroid enlargement, no tenderness.  LUNGS: Diminished breath sounds bilaterally, no wheezing, few scattered rales,rhonchi and crepitations noted bilaterally. Intermittent use of accessory muscles of respiration, with speech and movements.  CARDIOVASCULAR: S1, S2 normal. No murmurs, rubs, or gallops.  ABDOMEN: Soft, nontender, nondistended. Bowel sounds present. No organomegaly or mass.  EXTREMITIES: No pedal edema, cyanosis, or clubbing.  NEUROLOGIC: Cranial nerves II through XII are intact. Muscle strength 5/5 in all extremities. Sensation intact. Gait not checked.  PSYCHIATRIC: The patient is alert and oriented x 3.  SKIN: No obvious rash, lesion, or ulcer.   ORDERS/RESULTS REVIEWED:   CBC  Recent Labs Lab 03/01/17 2347 03/03/17 0351 03/04/17 0530  WBC 12.9* 11.1* 12.8*  HGB 11.6* 10.2* 10.6*  HCT 33.0* 29.0* 30.6*  PLT 237 224 285  MCV 88.8 91.1 90.7  MCH 31.3 31.9 31.6  MCHC 35.3 35.1 34.8  RDW 12.9 12.6 13.1   ------------------------------------------------------------------------------------------------------------------  Chemistries   Recent Labs Lab 03/01/17 2347 03/02/17 0747 03/03/17 0351 03/03/17 1341  NA 120* 128* 131* 132*  K 4.1 4.4  --   --   CL 87* 94*  --   --   CO2 24 27  --   --   GLUCOSE 117* 101*  --   --   BUN 7 6  --   --   CREATININE 0.57 0.68  --   --  CALCIUM 8.9 8.9  --   --   AST 23  --   --   --   ALT 14  --   --   --   ALKPHOS 90  --   --   --   BILITOT 0.8  --   --    --    ------------------------------------------------------------------------------------------------------------------ estimated creatinine clearance is 39.1 mL/min (by C-G formula based on SCr of 0.68 mg/dL). ------------------------------------------------------------------------------------------------------------------  Recent Labs  03/03/17 1721  TSH 0.456    Cardiac Enzymes  Recent Labs Lab 03/03/17 1341 03/03/17 1721 03/03/17 2131  TROPONINI 0.06* 0.44* 0.40*   ------------------------------------------------------------------------------------------------------------------ Invalid input(s): POCBNP ---------------------------------------------------------------------------------------------------------------  RADIOLOGY: No results found.  EKG:  Orders placed or performed during the hospital encounter of 03/02/17  . ED EKG within 10 minutes  . ED EKG within 10 minutes  . EKG 12-Lead  . EKG 12-Lead  . EKG 12-Lead  . EKG 12-Lead    ASSESSMENT AND PLAN:  Active Problems:   Acute on chronic respiratory failure (HCC)   COPD with acute exacerbation (HCC)   Left sided chest pain   Multifocal pneumonia   Hyponatremia   Dehydration   Leukocytosis  #1. Acute respiratory failure with hypoxia, patient was  weaned off oxygen , now on room air, O2 sats is stable at 95-96%, patient was  ambulated yesterday, and her oxygenation remained stable. #2. COPD exacerbation due to pneumonia,  Continue intravenous steroids, patient refuses to take any oral steroids at home, continue Tiotropium, nebulizing therapy every 4 hours, improved clinically #3. Unstable angina with elevated troponin concerning for non-Q-wave MI, continue patient on aspirin, nitroglycerin, metoprolol, heparin intravenously him a discussed with Dr. Candis Musa today, likely cardiac catheterization on Monday, no pulmonary embolism on CT angiogram #4. Bacterial pneumonia, the patient was managed on Rocephin and  Zithromax while in the hospital, she we will be discharged on  Cefdinir and Zithromax, to continue these medications for 5 more days, unable to get sputum , however, patient improved clinically, appreciate speech therapist input, aspiration precautions were recommended and soft diet #5. Essential hypertension, continue high dose of metoprolol, nitroglycerin topically for now, May need to at Norvasc and blood pressure remains high, ACE inhibitor has been stopped due to allergy  #6. Hyponatremia, improved on IV fluid administration, likely dehydration related. Recheck in the morning #7 tobacco abuse counseling, discussed this patient for 4 minutes during this admission, nicotine replacement therapy to be continued, she was agreeable  #8. Leukocytosis, some worsened, suspected due to steroids, a follow as outpatient   Management plans discussed with the patient, family and they are in agreement.   DRUG ALLERGIES:  Allergies  Allergen Reactions  . Aspirin Tinitus  . Prednisone Other (See Comments)    Makes violent  . Sulfa Antibiotics Nausea And Vomiting  . Symbicort [Budesonide-Formoterol Fumarate] Other (See Comments)    Patient felt "out of her mind" and angry.    CODE STATUS:  Code Status History    Date Active Date Inactive Code Status Order ID Comments User Context   07/18/2016  6:24 PM 07/20/2016  8:41 PM Full Code [id number]  Vaughan Basta, MD ED   04/28/2016  1:56 AM 04/29/2016  1:28 PM Full Code [id number]  Harvie Bridge, DO Inpatient   11/25/2015  7:33 PM 11/26/2015  3:19 PM Full Code [id number]  Nicholes Mango, MD Inpatient      TOTAL TIME TAKING CARE OF THIS PATIENT: 35 minutes.  Discussed with Dr. Candis Musa, and patient's daughter extensively,  all questions were answered  Nero Sawatzky M.D on 03/04/2017 at 12:08 PM  Between 7am to 6pm - Pager - [phone]  After 6pm go to [email or web address] - password [email or web address] [email or web address]  Office   [phone]  CC: Primary care physician; Ronnell Freshwater, NP

## 2017-03-04 NOTE — Consult Note (Signed)
ANTICOAGULATION CONSULT NOTE - Initial Consult  Pharmacy Consult for heparin drip Indication: chest pain/ACS  Allergies  Allergen Reactions  . Aspirin Tinitus  . Prednisone Other (See Comments)    Makes violent  . Sulfa Antibiotics Nausea And Vomiting  . Symbicort [Budesonide-Formoterol Fumarate] Other (See Comments)    Patient felt "out of her mind" and angry.    Patient Measurements: Height: 5\' 1"  (154.9 cm) Weight: 93 lb 11.2 oz (42.5 kg) IBW/kg (Calculated) : 47.8 Heparin Dosing Weight: 40.8kg  Vital Signs: Temp: 98.3 F (36.8 C) (08/19 0725) Temp Source: Oral (08/19 0725) BP: 165/84 (08/19 0725) Pulse Rate: 81 (08/19 0725)  Labs:  Recent Labs  03/01/17 2347 03/02/17 0747 03/03/17 0351 03/03/17 1341 03/03/17 1721 03/03/17 2131 03/04/17 0339 03/04/17 0530 03/04/17 1141  HGB 11.6*  --  10.2*  --   --   --   --  10.6*  --   HCT 33.0*  --  29.0*  --   --   --   --  30.6*  --   PLT 237  --  224  --   --   --   --  285  --   APTT  --   --   --   --   --  72*  --   --   --   LABPROT  --   --   --   --   --  14.6  --   --   --   INR  --   --   --   --   --  1.13  --   --   --   HEPARINUNFRC  --   --   --   --   --   --  <0.10*  --  <0.10*  CREATININE 0.57 0.68  --   --   --   --   --   --   --   TROPONINI <0.03  --   --  0.06* 0.44* 0.40*  --   --   --     Estimated Creatinine Clearance: 40.8 mL/min (by C-G formula based on SCr of 0.68 mg/dL).   Medical History: Past Medical History:  Diagnosis Date  . AAA (abdominal aortic aneurysm) ([place])   . COPD (chronic obstructive pulmonary disease) ([place])   . Hypertension     Medications:  Scheduled:  . [START ON 03/05/2017] aspirin  81 mg Oral Pre-Cath  . aspirin EC  81 mg Oral Daily  . atorvastatin  80 mg Oral q1800  . azithromycin  250 mg Oral Daily  . chlorpheniramine-HYDROcodone  5 mL Oral Q12H  . feeding supplement (ENSURE ENLIVE)  237 mL Oral Q24H  . guaiFENesin  600 mg Oral BID  . heparin  1,300 Units  Intravenous Once  . ipratropium-albuterol  3 mL Nebulization Q4H WA  . loratadine  10 mg Oral Daily  . methylPREDNISolone (SOLU-MEDROL) injection  125 mg Intravenous Once  . metoprolol tartrate  25 mg Oral BID  . multivitamin  15 mL Oral Daily  . nicotine  21 mg Transdermal Daily  . nitroGLYCERIN  0.5 inch Topical Q6H  . [START ON 03/05/2017] predniSONE  20 mg Oral Q breakfast   Followed by  . [START ON 03/06/2017] predniSONE  10 mg Oral Q breakfast   Followed by  . [START ON 03/07/2017] predniSONE  5 mg Oral Q breakfast  . tiotropium  18 mcg Inhalation Daily    Assessment: Pt is a  76 year old female who was admitted with SOB, chest pain, hypokalemia, weakness.   Goal of Therapy:  Heparin level 0.3-0.7 units/ml Monitor platelets by anticoagulation protocol: Yes   Plan:  Will bolus heparin 1300 units iv once and will increase infusion to 800 units/hr. Will recheck a HL 8 hours after rate increase.   Ulice Dash, PharmD Clinical Pharmacist  03/04/2017,2:30 PM

## 2017-03-04 NOTE — Consult Note (Signed)
Cardiology Consultation:   Patient ID: DEMMI SINDT; [id number]; 02/09/1941   Admit date: 03/02/2017 Date of Consult: 03/04/2017  Primary Care Provider: Ronnell Freshwater, NP Primary Cardiologist: New to [facility] [place] Physician requesting consult: [place] Reason for consult: Unstable angina, elevated troponin, EKG changes   Patient Profile:   Christina Hester is a 76 y.o. female with a hx of Smoking, COPD, PAD, history of pneumonia presenting to the hospital 03/02/2017 with left-sided chest pain, shortness of breath.   History of Present Illness:   Christina Hester presented 2 days ago with nonproductive cough, shortness of breath, left-sided chest pain, nausea vomiting, generalized weakness. Reports having anorexia Stuttering chest pain over the past month on the left, sometimes severe radiating to her left shoulder and through to her back Sometimes comes on at rest, sometimes with exertion  In the hospital started on broad-spectrum antibiotics based off possible pneumonia seen on CT scan chest  CT scan chest images reviewed person by myself showing significant coronary calcifications extending into the left main and proximal LAD among other vessels  Troponin up to 0.44  Repeat EKG yesterday showing ST depressions V5 and V6 Currently on heparin infusion  Past Medical History:  Diagnosis Date  . AAA (abdominal aortic aneurysm) ([place])   . COPD (chronic obstructive pulmonary disease) ([place])   . Hypertension     Past Surgical History:  Procedure Laterality Date  . ABDOMINAL HYSTERECTOMY    . BREAST EXCISIONAL BIOPSY Left 20+ YRS AGO   NEG     Home Medications:  Prior to Admission medications   Medication Sig Start Date End Date Taking? Authorizing Provider  albuterol (PROVENTIL HFA;VENTOLIN HFA) 108 (90 Base) MCG/ACT inhaler Inhale 2 puffs into the lungs every 6 (six) hours as needed for wheezing or shortness of breath.   Yes [provider]  cetirizine (ZYRTEC) 10 MG  tablet Take 1 tablet by mouth daily.   Yes [provider]  ipratropium-albuterol (DUONEB) 0.5-2.5 (3) MG/3ML SOLN Take 3 mLs by nebulization every 6 (six) hours as needed. 07/20/16  Yes Gladstone Lighter, MD  tiotropium (SPIRIVA) 18 MCG inhalation capsule Place 18 mcg into inhaler and inhale daily.   Yes [provider]  amLODipine (NORVASC) 2.5 MG tablet Take 1 tablet (2.5 mg total) by mouth daily. 03/04/17   Theodoro Grist, MD  azithromycin (ZITHROMAX) 250 MG tablet One pill once daily until finished, thank you 03/04/17   Theodoro Grist, MD  budesonide-formoterol [facility]) 160-4.5 MCG/ACT inhaler Inhale 2 puffs into the lungs 2 (two) times daily. 03/03/17   Theodoro Grist, MD  cefdinir (OMNICEF) 300 MG capsule Take 1 capsule (300 mg total) by mouth 2 (two) times daily. 03/03/17   Theodoro Grist, MD  chlorpheniramine-HYDROcodone (TUSSIONEX) 10-8 MG/5ML SUER Take 5 mLs by mouth every 12 (twelve) hours. 03/03/17   Theodoro Grist, MD  feeding supplement, ENSURE ENLIVE, (ENSURE ENLIVE) LIQD Take 237 mLs by mouth daily. 03/04/17   Theodoro Grist, MD  guaiFENesin (MUCINEX) 600 MG 12 hr tablet Take 1 tablet (600 mg total) by mouth 2 (two) times daily. 03/03/17   Theodoro Grist, MD  ipratropium-albuterol (DUONEB) 0.5-2.5 (3) MG/3ML SOLN Take 3 mLs by nebulization every 4 (four) hours. 03/03/17   Theodoro Grist, MD  lisinopril (PRINIVIL,ZESTRIL) 2.5 MG tablet Take 2.5 mg by mouth daily.    [provider]  nicotine (NICODERM CQ - DOSED IN MG/24 HOURS) 21 mg/24hr patch Place 1 patch (21 mg total) onto the skin daily. 03/04/17   Theodoro Grist,  MD  Omega-3 Fatty Acids (FISH OIL PO) Take 1 capsule by mouth daily.    [provider]  predniSONE (STERAPRED UNI-PAK 21 TAB) 10 MG (21) TBPK tablet Please take 6 pills in the morning on the day 1, then taper by one pill daily until finished, thank you 03/03/17   Theodoro Grist, MD  trolamine salicylate (ASPERCREME) 10 % cream Apply topically  as needed for muscle pain. 03/03/17   Theodoro Grist, MD    Inpatient Medications: Scheduled Meds: . aspirin EC  81 mg Oral Daily  . azithromycin  250 mg Oral Daily  . chlorpheniramine-HYDROcodone  5 mL Oral Q12H  . feeding supplement (ENSURE ENLIVE)  237 mL Oral Q24H  . guaiFENesin  600 mg Oral BID  . ipratropium-albuterol  3 mL Nebulization Q4H WA  . loratadine  10 mg Oral Daily  . methylPREDNISolone (SOLU-MEDROL) injection  125 mg Intravenous Once  . metoprolol tartrate  25 mg Oral BID  . multivitamin  15 mL Oral Daily  . nicotine  21 mg Transdermal Daily  . nitroGLYCERIN  0.5 inch Topical Q6H  . [START ON 03/05/2017] predniSONE  20 mg Oral Q breakfast   Followed by  . [START ON 03/06/2017] predniSONE  10 mg Oral Q breakfast   Followed by  . [START ON 03/07/2017] predniSONE  5 mg Oral Q breakfast  . tiotropium  18 mcg Inhalation Daily   Continuous Infusions: . sodium chloride 75 mL/hr at 03/03/17 1054  . cefTRIAXone (ROCEPHIN)  IV 1 g (03/04/17 1149)  . heparin 600 Units/hr (03/04/17 0454)   PRN Meds: alum & mag hydroxide-simeth, haloperidol, LORazepam, morphine injection, trolamine salicylate  Allergies:    Allergies  Allergen Reactions  . Aspirin Tinitus  . Prednisone Other (See Comments)    Makes violent  . Sulfa Antibiotics Nausea And Vomiting  . Symbicort [Budesonide-Formoterol Fumarate] Other (See Comments)    Patient felt "out of her mind" and angry.    Social History:   Social History   Social History  . Marital status: Divorced    Spouse name: N/A  . Number of children: N/A  . Years of education: N/A   Occupational History  . Not on file.   Social History Main Topics  . Smoking status: Former Smoker    Packs/day: 0.25  . Smokeless tobacco: Never Used  . Alcohol use No  . Drug use: No  . Sexual activity: Not on file   Other Topics Concern  . Not on file   Social History Narrative  . No narrative on file    Family History:    Family History   Problem Relation Age of Onset  . Breast cancer Mother   . Breast cancer Maternal Aunt   . Breast cancer Maternal Aunt   . Breast cancer Maternal Aunt      ROS:  Please see the history of present illness.  Review of Systems  Constitution: Negative for diaphoresis, fever, weakness, malaise/fatigue and night sweats.  HENT: Negative.   Eyes: Negative.   Cardiovascular: Positive for chest pain. Negative for claudication, cyanosis, dyspnea on exertion, irregular heartbeat, leg swelling, near-syncope, orthopnea, palpitations and paroxysmal nocturnal dyspnea.  Respiratory: Positive for cough. Negative for shortness of breath, sleep disturbances due to breathing and wheezing.   Endocrine: Negative.   Hematologic/Lymphatic: Negative.   Skin: Negative.   Musculoskeletal: Negative for falls, joint pain, joint swelling and myalgias.  Gastrointestinal: Negative.   Neurological: Negative for difficulty with concentration, excessive daytime sleepiness, dizziness,  focal weakness, light-headedness and numbness.  Psychiatric/Behavioral: Negative.    All other ROS reviewed and negative.     Physical Exam/Data:   Vitals:   03/03/17 1925 03/03/17 2005 03/04/17 0333 03/04/17 0725  BP: 136/60  (!) 163/70 (!) 165/84  Pulse: 89  69 81  Resp:    18  Temp: 98 F (36.7 C)  98.2 F (36.8 C) 98.3 F (36.8 C)  TempSrc: Oral  Oral Oral  SpO2: 94% 94% 95% 95%  Weight:      Height:        Intake/Output Summary (Last 24 hours) at 03/04/17 1201 Last data filed at 03/04/17 0915  Gross per 24 hour  Intake          1626.25 ml  Output                0 ml  Net          1626.25 ml   Filed Weights   03/01/17 2339  Weight: 90 lb (40.8 kg)   Body mass index is 17.01 kg/m.  General:  Well nourished, well developed, in no acute distress HEENT: normal Lymph: no adenopathy Neck: no JVD Endocrine:  No thryomegaly Vascular: No carotid bruits; FA pulses 2+ bilaterally without bruits  Cardiac:  normal S1,  S2; RRR; no murmur  Lungs: Moderately decreased breath sounds bilaterally, Rales ,  no wheezing, rhonchi Abd: soft, nontender, no hepatomegaly  Ext: no edema Musculoskeletal:  No deformities, BUE and BLE strength normal and equal Skin: warm and dry  Neuro:  CNs 2-12 intact, no focal abnormalities noted Psych:  Normal affect   EKG:  The EKG was personally reviewed and demonstrates:   August 16 Normal sinus rhythm with no significant ST or T-wave changes Repeat EKG August 18 sinus rhythm with ST depressions V5 and V6   Telemetry:  Telemetry was personally reviewed and demonstrates: Normal sinus rhythm   Relevant CV Studies:  Echo pending  Laboratory Data:  Chemistry Recent Labs Lab 03/01/17 2347 03/02/17 0747 03/03/17 0351 03/03/17 1341  NA 120* 128* 131* 132*  K 4.1 4.4  --   --   CL 87* 94*  --   --   CO2 24 27  --   --   GLUCOSE 117* 101*  --   --   BUN 7 6  --   --   CREATININE 0.57 0.68  --   --   CALCIUM 8.9 8.9  --   --   GFRNONAA >60 >60  --   --   GFRAA >60 >60  --   --   ANIONGAP 9 7  --   --      Recent Labs Lab 03/01/17 2347  PROT 6.9  ALBUMIN 3.4*  AST 23  ALT 14  ALKPHOS 90  BILITOT 0.8   Hematology Recent Labs Lab 03/01/17 2347 03/03/17 0351 03/04/17 0530  WBC 12.9* 11.1* 12.8*  RBC 3.72* 3.18* 3.37*  HGB 11.6* 10.2* 10.6*  HCT 33.0* 29.0* 30.6*  MCV 88.8 91.1 90.7  MCH 31.3 31.9 31.6  MCHC 35.3 35.1 34.8  RDW 12.9 12.6 13.1  PLT 237 224 285   Cardiac Enzymes Recent Labs Lab 03/01/17 2347 03/03/17 1341 03/03/17 1721 03/03/17 2131  TROPONINI <0.03 0.06* 0.44* 0.40*   No results for input(s): TROPIPOC in the last 168 hours.  BNPNo results for input(s): BNP, PROBNP in the last 168 hours.  DDimer No results for input(s): DDIMER in the last 168 hours.  Radiology/Studies:  Dg Chest 2 View  Result Date: 03/02/2017 CLINICAL DATA:  Acute onset of shortness of breath and left chest pain, radiating to the back. Nausea and vomiting.  Initial encounter. EXAM: CHEST  2 VIEW COMPARISON:  Chest radiograph and CTA of the chest performed 01/12/2017 FINDINGS: The lungs are well-aerated. Peribronchial thickening is noted. There is no evidence of focal opacification, pleural effusion or pneumothorax. The heart is normal in size; the mediastinal contour is within normal limits. No acute osseous abnormalities are seen. IMPRESSION: Peribronchial thickening noted.  Lungs otherwise grossly clear. Electronically Signed   By: Garald Balding M.D.   On: 03/02/2017 00:14   Ct Angio Chest Pe W Or Wo Contrast  Result Date: 03/02/2017 CLINICAL DATA:  Left-sided chest pain and shortness of breath for 3 days. EXAM: CT ANGIOGRAPHY CHEST WITH CONTRAST TECHNIQUE: Multidetector CT imaging of the chest was performed using the standard protocol during bolus administration of intravenous contrast. Multiplanar CT image reconstructions and MIPs were obtained to evaluate the vascular anatomy. CONTRAST:  75 cc Isovue 370 intravenous contrast. COMPARISON:  Chest CT dated January 12, 2017. FINDINGS: Cardiovascular: Satisfactory opacification of the pulmonary arteries to the segmental level. No evidence of pulmonary embolism. Normal heart size. No pericardial effusion. No thoracic aortic aneurysm. Coronary, aortic arch, and branch vessel atherosclerotic vascular disease. Mediastinum/Nodes: No enlarged mediastinal, hilar, or axillary lymph nodes. Thyroid gland, trachea, and esophagus demonstrate no significant findings. Lungs/Pleura: New patchy areas of predominantly centrilobular nodularity in the anterior and apical right upper lobe, superior and basal right lower lobe, anterior left upper lobe, to lesser extent the left lower lobe. More focal areas of consolidation in the bilateral anterior upper lobes. Consolidation in the lingula is again noted. No pleural effusion or pneumothorax. Moderate upper lobe predominant centrilobular emphysematous change, similar to prior study. Upper  Abdomen: No acute abnormality. Musculoskeletal: No chest wall abnormality. No acute or significant osseous findings. Review of the MIP images confirms the above findings. IMPRESSION: 1. No evidence of pulmonary embolism. 2. New patchy areas of predominantly centrilobular nodularity in both lungs, with more focal small areas of consolidation in the lingula and bilateral anterior upper lobes. Findings are favored to represent multifocal pneumonia. 3.  Emphysema (ICD10-J43.9). 4.  Aortic atherosclerosis (ICD10-I70.0). Electronically Signed   By: Titus Dubin M.D.   On: 03/02/2017 10:38    Assessment and Plan:   1) unstable angina Chest pain over the past month waxing waning In the setting of new EKG changes ST depressions V5 and V6 seen yesterday Also with troponin elevation 0.44 now trending down CT scan images reviewed by myself showing significant coronary calcifications Symptoms concerning for unstable angina Discussed various treatment strategies and workup available After long discussion, recommended cardiac catheterization given worsening symptoms of chest pain radiating to her shoulder and back I have reviewed the risks, indications, and alternatives to cardiac catheterization, possible angioplasty, and stenting with the patient. Risks include but are not limited to bleeding, infection, vascular injury, stroke, myocardial infection, arrhythmia, kidney injury, radiation-related injury in the case of prolonged fluoroscopy use, emergency cardiac surgery, and death. The patient understands the risks of serious complication is 1-2 in 1610 with diagnostic cardiac cath and 1-2% or less with angioplasty/stenting.  Cardiac catheterization scheduled tomorrow at 10:30 with Dr. Fletcher Anon Continue heparin, will discontinue heparin prior to cardiac catheterization  2) smoker/COPD We have encouraged her to continue to work on weaning her cigarettes and smoking cessation. She will continue to work on this and  does not want any assistance with chantix.   3) acute respiratory distress  Possible bacterial pneumonia, seen on CT scan chest She is on broad-spectrum antibiotics, appears comfortable  4) hyponatremia Currently on normal saline  5) anemia Hematocrit 30 Would recommend iron studies, guaiac   Total encounter time more than 110 minutes  Greater than 50% was spent in counseling and coordination of care with the patient   Signed, Ida Rogue, MD  03/04/2017 12:01 PM

## 2017-03-04 NOTE — Progress Notes (Signed)
Pt is complaining of feeling hot, burning in chest and chest pain. Pt was NSR on telemetry monitor, now she is sinus tach. Dr Virgia Land notified. Order received to administer maalox and morphine to patient.

## 2017-03-05 ENCOUNTER — Inpatient Hospital Stay (HOSPITAL_COMMUNITY)
Admit: 2017-03-05 | Discharge: 2017-03-05 | Disposition: A | Discharge status: Home / Self Care | Payer: Medicare Other | Attending: Internal Medicine | Admitting: Internal Medicine

## 2017-03-05 ENCOUNTER — Encounter
Admission: EM | Disposition: A | Discharge status: Home / Self Care | Payer: Self-pay | Source: Home / Self Care | Attending: Internal Medicine

## 2017-03-05 DIAGNOSIS — I340 Nonrheumatic mitral (valve) insufficiency: Secondary | ICD-10-CM

## 2017-03-05 DIAGNOSIS — I25110 Atherosclerotic heart disease of native coronary artery with unstable angina pectoris: Principal | ICD-10-CM

## 2017-03-05 HISTORY — PX: LEFT HEART CATH: CATH118248

## 2017-03-05 LAB — ECHOCARDIOGRAM COMPLETE
Height: 61 in
Weight: 1488 oz

## 2017-03-05 LAB — BASIC METABOLIC PANEL
Anion gap: 7 (ref 5–15)
BUN: 15 mg/dL (ref 6–20)
CHLORIDE: 100 mmol/L — AB (ref 101–111)
CO2: 29 mmol/L (ref 22–32)
Calcium: 8.9 mg/dL (ref 8.9–10.3)
Creatinine, Ser: 0.64 mg/dL (ref 0.44–1.00)
GFR calc Af Amer: >60 mL/min (ref >60)
GFR calc non Af Amer: >60 mL/min (ref >60)
GLUCOSE: 82 mg/dL (ref 65–99)
POTASSIUM: 4.2 mmol/L (ref 3.5–5.1)
Sodium: 136 mmol/L (ref 135–145)

## 2017-03-05 LAB — CBC
HEMATOCRIT: 33.5 % — AB (ref 35.0–47.0)
HEMOGLOBIN: 11.3 g/dL — AB (ref 12.0–16.0)
MCH: 31.3 pg (ref 26.0–34.0)
MCHC: 33.8 g/dL (ref 32.0–36.0)
MCV: 92.7 fL (ref 80.0–100.0)
Platelets: 334 10*3/uL (ref 150–440)
RBC: 3.62 MIL/uL — AB (ref 3.80–5.20)
RDW: 13.1 % (ref 11.5–14.5)
WBC: 11.2 10*3/uL — ABNORMAL HIGH (ref 3.6–11.0)

## 2017-03-05 LAB — URINALYSIS, COMPLETE (UACMP) WITH MICROSCOPIC
BILIRUBIN URINE: NEGATIVE
Bacteria, UA: NONE SEEN
Glucose, UA: NEGATIVE mg/dL
HGB URINE DIPSTICK: NEGATIVE
Ketones, ur: NEGATIVE mg/dL
Leukocytes, UA: NEGATIVE
Nitrite: NEGATIVE
PH: 8 (ref 5.0–8.0)
Protein, ur: NEGATIVE mg/dL
SPECIFIC GRAVITY, URINE: 1.011 (ref 1.005–1.030)
Squamous Epithelial / LPF: NONE SEEN
WBC, UA: NONE SEEN WBC/hpf (ref 0–5)

## 2017-03-05 LAB — HEPARIN LEVEL (UNFRACTIONATED): HEPARIN UNFRACTIONATED: 0.52 [IU]/mL (ref 0.30–0.70)

## 2017-03-05 SURGERY — LEFT HEART CATH
Anesthesia: Moderate Sedation | Laterality: Bilateral

## 2017-03-05 MED ORDER — ENOXAPARIN SODIUM 40 MG/0.4ML ~~LOC~~ SOLN
40.0000 mg | SUBCUTANEOUS | Status: DC
  Filled 2017-03-05: qty 0.4

## 2017-03-05 MED ORDER — SODIUM CHLORIDE 0.9 % IV SOLN: 250.0000 mL | INTRAVENOUS | Status: DC | PRN

## 2017-03-05 MED ORDER — SODIUM CHLORIDE 0.9% FLUSH: 3.0000 mL | INTRAVENOUS | Status: DC | PRN

## 2017-03-05 MED ORDER — HEPARIN BOLUS VIA INFUSION
650.0000 [IU] | Freq: Once | INTRAVENOUS | Status: AC
  Administered 2017-03-05: 650 [IU] via INTRAVENOUS
  Filled 2017-03-05: qty 650

## 2017-03-05 MED ORDER — VERAPAMIL HCL 2.5 MG/ML IV SOLN
INTRAVENOUS | Status: DC | PRN
  Administered 2017-03-05: 2.5 mg via INTRA_ARTERIAL

## 2017-03-05 MED ORDER — SODIUM CHLORIDE 0.9% FLUSH: 3.0000 mL | Freq: Two times a day (BID) | INTRAVENOUS | Status: DC

## 2017-03-05 MED ORDER — IOPAMIDOL (ISOVUE-300) INJECTION 61%
INTRAVENOUS | Status: DC | PRN
  Administered 2017-03-05: 50 mL via INTRA_ARTERIAL

## 2017-03-05 MED ORDER — HEPARIN SODIUM (PORCINE) 1000 UNIT/ML IJ SOLN
INTRAMUSCULAR | Status: AC
  Filled 2017-03-05: qty 1

## 2017-03-05 MED ORDER — MIDAZOLAM HCL 2 MG/2ML IJ SOLN
INTRAMUSCULAR | Status: AC
  Filled 2017-03-05: qty 2

## 2017-03-05 MED ORDER — AMLODIPINE BESYLATE 5 MG PO TABS
5.0000 mg | ORAL_TABLET | Freq: Every day | ORAL | Status: DC
  Administered 2017-03-05 – 2017-03-06 (×2): 5 mg via ORAL
  Filled 2017-03-05 (×2): qty 1

## 2017-03-05 MED ORDER — FENTANYL CITRATE (PF) 100 MCG/2ML IJ SOLN
INTRAMUSCULAR | Status: AC
  Filled 2017-03-05: qty 2

## 2017-03-05 MED ORDER — MIDAZOLAM HCL 2 MG/2ML IJ SOLN
INTRAMUSCULAR | Status: DC | PRN
  Administered 2017-03-05: 1 mg via INTRAVENOUS

## 2017-03-05 MED ORDER — VERAPAMIL HCL 2.5 MG/ML IV SOLN
INTRAVENOUS | Status: AC
  Filled 2017-03-05: qty 2

## 2017-03-05 MED ORDER — LIDOCAINE HCL (PF) 1 % IJ SOLN
INTRAMUSCULAR | Status: AC
  Filled 2017-03-05: qty 30

## 2017-03-05 MED ORDER — SODIUM CHLORIDE 0.9 % IV SOLN
INTRAVENOUS | Status: AC
  Administered 2017-03-05: 16:00:00 via INTRAVENOUS

## 2017-03-05 SURGICAL SUPPLY — 6 items
CATH OPTITORQUE JACKY 4.0 5F (CATHETERS) ×2 IMPLANT
DEVICE RAD TR BAND REGULAR (VASCULAR PRODUCTS) ×2 IMPLANT
GLIDESHEATH SLEND SS 6F .021 (SHEATH) ×2 IMPLANT
KIT MANI 3VAL PERCEP (MISCELLANEOUS) ×2 IMPLANT
PACK CARDIAC CATH (CUSTOM PROCEDURE TRAY) ×2 IMPLANT
WIRE ROSEN-J .035X260CM (WIRE) ×2 IMPLANT

## 2017-03-05 NOTE — H&P (View-Only) (Signed)
Cardiology Consultation:   Patient ID: Christina Hester; [id number]; 1941/02/20   Admit date: 03/02/2017 Date of Consult: 03/04/2017  Primary Care Provider: Ronnell Freshwater, NP Primary Cardiologist: New to [facility] Physician requesting consult: [place] Reason for consult: Unstable angina, elevated troponin, EKG changes   Patient Profile:   Christina Hester is a 76 y.o. female with a hx of Smoking, COPD, PAD, history of pneumonia presenting to the hospital 03/02/2017 with left-sided chest pain, shortness of breath.   History of Present Illness:   [name] presented 2 days ago with nonproductive cough, shortness of breath, left-sided chest pain, nausea vomiting, generalized weakness. Reports having anorexia Stuttering chest pain over the past month on the left, sometimes severe radiating to her left shoulder and through to her back Sometimes comes on at rest, sometimes with exertion  In the hospital started on broad-spectrum antibiotics based off possible pneumonia seen on CT scan chest  CT scan chest images reviewed person by myself showing significant coronary calcifications extending into the left main and proximal LAD among other vessels  Troponin up to 0.44  Repeat EKG yesterday showing ST depressions V5 and V6 Currently on heparin infusion  Past Medical History:  Diagnosis Date  . AAA (abdominal aortic aneurysm) ([place])   . COPD (chronic obstructive pulmonary disease) ([place])   . Hypertension     Past Surgical History:  Procedure Laterality Date  . ABDOMINAL HYSTERECTOMY    . BREAST EXCISIONAL BIOPSY Left 20+ YRS AGO   NEG     Home Medications:  Prior to Admission medications   Medication Sig Start Date End Date Taking? Authorizing Provider  albuterol (PROVENTIL HFA;VENTOLIN HFA) 108 (90 Base) MCG/ACT inhaler Inhale 2 puffs into the lungs every 6 (six) hours as needed for wheezing or shortness of breath.   Yes [provider]  cetirizine (ZYRTEC) 10 MG  tablet Take 1 tablet by mouth daily.   Yes [provider]  ipratropium-albuterol (DUONEB) 0.5-2.5 (3) MG/3ML SOLN Take 3 mLs by nebulization every 6 (six) hours as needed. 07/20/16  Yes Gladstone Lighter, MD  tiotropium (SPIRIVA) 18 MCG inhalation capsule Place 18 mcg into inhaler and inhale daily.   Yes [provider]  amLODipine (NORVASC) 2.5 MG tablet Take 1 tablet (2.5 mg total) by mouth daily. 03/04/17   Theodoro Grist, MD  azithromycin (ZITHROMAX) 250 MG tablet One pill once daily until finished, thank you 03/04/17   Theodoro Grist, MD  budesonide-formoterol [facility]) 160-4.5 MCG/ACT inhaler Inhale 2 puffs into the lungs 2 (two) times daily. 03/03/17   Theodoro Grist, MD  cefdinir (OMNICEF) 300 MG capsule Take 1 capsule (300 mg total) by mouth 2 (two) times daily. 03/03/17   Theodoro Grist, MD  chlorpheniramine-HYDROcodone (TUSSIONEX) 10-8 MG/5ML SUER Take 5 mLs by mouth every 12 (twelve) hours. 03/03/17   Theodoro Grist, MD  feeding supplement, ENSURE ENLIVE, (ENSURE ENLIVE) LIQD Take 237 mLs by mouth daily. 03/04/17   Theodoro Grist, MD  guaiFENesin (MUCINEX) 600 MG 12 hr tablet Take 1 tablet (600 mg total) by mouth 2 (two) times daily. 03/03/17   Theodoro Grist, MD  ipratropium-albuterol (DUONEB) 0.5-2.5 (3) MG/3ML SOLN Take 3 mLs by nebulization every 4 (four) hours. 03/03/17   Theodoro Grist, MD  lisinopril (PRINIVIL,ZESTRIL) 2.5 MG tablet Take 2.5 mg by mouth daily.    [provider]  nicotine (NICODERM CQ - DOSED IN MG/24 HOURS) 21 mg/24hr patch Place 1 patch (21 mg total) onto the skin daily. 03/04/17   Theodoro Grist,  MD  Omega-3 Fatty Acids (FISH OIL PO) Take 1 capsule by mouth daily.    [provider]  predniSONE (STERAPRED UNI-PAK 21 TAB) 10 MG (21) TBPK tablet Please take 6 pills in the morning on the day 1, then taper by one pill daily until finished, thank you 03/03/17   Theodoro Grist, MD  trolamine salicylate (ASPERCREME) 10 % cream Apply topically  as needed for muscle pain. 03/03/17   Theodoro Grist, MD    Inpatient Medications: Scheduled Meds: . aspirin EC  81 mg Oral Daily  . azithromycin  250 mg Oral Daily  . chlorpheniramine-HYDROcodone  5 mL Oral Q12H  . feeding supplement (ENSURE ENLIVE)  237 mL Oral Q24H  . guaiFENesin  600 mg Oral BID  . ipratropium-albuterol  3 mL Nebulization Q4H WA  . loratadine  10 mg Oral Daily  . methylPREDNISolone (SOLU-MEDROL) injection  125 mg Intravenous Once  . metoprolol tartrate  25 mg Oral BID  . multivitamin  15 mL Oral Daily  . nicotine  21 mg Transdermal Daily  . nitroGLYCERIN  0.5 inch Topical Q6H  . [START ON 03/05/2017] predniSONE  20 mg Oral Q breakfast   Followed by  . [START ON 03/06/2017] predniSONE  10 mg Oral Q breakfast   Followed by  . [START ON 03/07/2017] predniSONE  5 mg Oral Q breakfast  . tiotropium  18 mcg Inhalation Daily   Continuous Infusions: . sodium chloride 75 mL/hr at 03/03/17 1054  . cefTRIAXone (ROCEPHIN)  IV 1 g (03/04/17 1149)  . heparin 600 Units/hr (03/04/17 0454)   PRN Meds: alum & mag hydroxide-simeth, haloperidol, LORazepam, morphine injection, trolamine salicylate  Allergies:    Allergies  Allergen Reactions  . Aspirin Tinitus  . Prednisone Other (See Comments)    Makes violent  . Sulfa Antibiotics Nausea And Vomiting  . Symbicort [Budesonide-Formoterol Fumarate] Other (See Comments)    Patient felt "out of her mind" and angry.    Social History:   Social History   Social History  . Marital status: Divorced    Spouse name: N/A  . Number of children: N/A  . Years of education: N/A   Occupational History  . Not on file.   Social History Main Topics  . Smoking status: Former Smoker    Packs/day: 0.25  . Smokeless tobacco: Never Used  . Alcohol use No  . Drug use: No  . Sexual activity: Not on file   Other Topics Concern  . Not on file   Social History Narrative  . No narrative on file    Family History:    Family History   Problem Relation Age of Onset  . Breast cancer Mother   . Breast cancer Maternal Aunt   . Breast cancer Maternal Aunt   . Breast cancer Maternal Aunt      ROS:  Please see the history of present illness.  Review of Systems  Constitution: Negative for diaphoresis, fever, weakness, malaise/fatigue and night sweats.  HENT: Negative.   Eyes: Negative.   Cardiovascular: Positive for chest pain. Negative for claudication, cyanosis, dyspnea on exertion, irregular heartbeat, leg swelling, near-syncope, orthopnea, palpitations and paroxysmal nocturnal dyspnea.  Respiratory: Positive for cough. Negative for shortness of breath, sleep disturbances due to breathing and wheezing.   Endocrine: Negative.   Hematologic/Lymphatic: Negative.   Skin: Negative.   Musculoskeletal: Negative for falls, joint pain, joint swelling and myalgias.  Gastrointestinal: Negative.   Neurological: Negative for difficulty with concentration, excessive daytime sleepiness, dizziness,  focal weakness, light-headedness and numbness.  Psychiatric/Behavioral: Negative.    All other ROS reviewed and negative.     Physical Exam/Data:   Vitals:   03/03/17 1925 03/03/17 2005 03/04/17 0333 03/04/17 0725  BP: 136/60  (!) 163/70 (!) 165/84  Pulse: 89  69 81  Resp:    18  Temp: 98 F (36.7 C)  98.2 F (36.8 C) 98.3 F (36.8 C)  TempSrc: Oral  Oral Oral  SpO2: 94% 94% 95% 95%  Weight:      Height:        Intake/Output Summary (Last 24 hours) at 03/04/17 1201 Last data filed at 03/04/17 0915  Gross per 24 hour  Intake          1626.25 ml  Output                0 ml  Net          1626.25 ml   Filed Weights   03/01/17 2339  Weight: 90 lb (40.8 kg)   Body mass index is 17.01 kg/m.  General:  Well nourished, well developed, in no acute distress HEENT: normal Lymph: no adenopathy Neck: no JVD Endocrine:  No thryomegaly Vascular: No carotid bruits; FA pulses 2+ bilaterally without bruits  Cardiac:  normal S1,  S2; RRR; no murmur  Lungs: Moderately decreased breath sounds bilaterally, Rales ,  no wheezing, rhonchi Abd: soft, nontender, no hepatomegaly  Ext: no edema Musculoskeletal:  No deformities, BUE and BLE strength normal and equal Skin: warm and dry  Neuro:  CNs 2-12 intact, no focal abnormalities noted Psych:  Normal affect   EKG:  The EKG was personally reviewed and demonstrates:   August 16 Normal sinus rhythm with no significant ST or T-wave changes Repeat EKG August 18 sinus rhythm with ST depressions V5 and V6   Telemetry:  Telemetry was personally reviewed and demonstrates: Normal sinus rhythm   Relevant CV Studies:  Echo pending  Laboratory Data:  Chemistry Recent Labs Lab 03/01/17 2347 03/02/17 0747 03/03/17 0351 03/03/17 1341  NA 120* 128* 131* 132*  K 4.1 4.4  --   --   CL 87* 94*  --   --   CO2 24 27  --   --   GLUCOSE 117* 101*  --   --   BUN 7 6  --   --   CREATININE 0.57 0.68  --   --   CALCIUM 8.9 8.9  --   --   GFRNONAA >60 >60  --   --   GFRAA >60 >60  --   --   ANIONGAP 9 7  --   --      Recent Labs Lab 03/01/17 2347  PROT 6.9  ALBUMIN 3.4*  AST 23  ALT 14  ALKPHOS 90  BILITOT 0.8   Hematology Recent Labs Lab 03/01/17 2347 03/03/17 0351 03/04/17 0530  WBC 12.9* 11.1* 12.8*  RBC 3.72* 3.18* 3.37*  HGB 11.6* 10.2* 10.6*  HCT 33.0* 29.0* 30.6*  MCV 88.8 91.1 90.7  MCH 31.3 31.9 31.6  MCHC 35.3 35.1 34.8  RDW 12.9 12.6 13.1  PLT 237 224 285   Cardiac Enzymes Recent Labs Lab 03/01/17 2347 03/03/17 1341 03/03/17 1721 03/03/17 2131  TROPONINI <0.03 0.06* 0.44* 0.40*   No results for input(s): TROPIPOC in the last 168 hours.  BNPNo results for input(s): BNP, PROBNP in the last 168 hours.  DDimer No results for input(s): DDIMER in the last 168 hours.  Radiology/Studies:  Dg Chest 2 View  Result Date: 03/02/2017 CLINICAL DATA:  Acute onset of shortness of breath and left chest pain, radiating to the back. Nausea and vomiting.  Initial encounter. EXAM: CHEST  2 VIEW COMPARISON:  Chest radiograph and CTA of the chest performed 01/12/2017 FINDINGS: The lungs are well-aerated. Peribronchial thickening is noted. There is no evidence of focal opacification, pleural effusion or pneumothorax. The heart is normal in size; the mediastinal contour is within normal limits. No acute osseous abnormalities are seen. IMPRESSION: Peribronchial thickening noted.  Lungs otherwise grossly clear. Electronically Signed   By: Garald Balding M.D.   On: 03/02/2017 00:14   Ct Angio Chest Pe W Or Wo Contrast  Result Date: 03/02/2017 CLINICAL DATA:  Left-sided chest pain and shortness of breath for 3 days. EXAM: CT ANGIOGRAPHY CHEST WITH CONTRAST TECHNIQUE: Multidetector CT imaging of the chest was performed using the standard protocol during bolus administration of intravenous contrast. Multiplanar CT image reconstructions and MIPs were obtained to evaluate the vascular anatomy. CONTRAST:  75 cc Isovue 370 intravenous contrast. COMPARISON:  Chest CT dated January 12, 2017. FINDINGS: Cardiovascular: Satisfactory opacification of the pulmonary arteries to the segmental level. No evidence of pulmonary embolism. Normal heart size. No pericardial effusion. No thoracic aortic aneurysm. Coronary, aortic arch, and branch vessel atherosclerotic vascular disease. Mediastinum/Nodes: No enlarged mediastinal, hilar, or axillary lymph nodes. Thyroid gland, trachea, and esophagus demonstrate no significant findings. Lungs/Pleura: New patchy areas of predominantly centrilobular nodularity in the anterior and apical right upper lobe, superior and basal right lower lobe, anterior left upper lobe, to lesser extent the left lower lobe. More focal areas of consolidation in the bilateral anterior upper lobes. Consolidation in the lingula is again noted. No pleural effusion or pneumothorax. Moderate upper lobe predominant centrilobular emphysematous change, similar to prior study. Upper  Abdomen: No acute abnormality. Musculoskeletal: No chest wall abnormality. No acute or significant osseous findings. Review of the MIP images confirms the above findings. IMPRESSION: 1. No evidence of pulmonary embolism. 2. New patchy areas of predominantly centrilobular nodularity in both lungs, with more focal small areas of consolidation in the lingula and bilateral anterior upper lobes. Findings are favored to represent multifocal pneumonia. 3.  Emphysema (ICD10-J43.9). 4.  Aortic atherosclerosis (ICD10-I70.0). Electronically Signed   By: Titus Dubin M.D.   On: 03/02/2017 10:38    Assessment and Plan:   1) unstable angina Chest pain over the past month waxing waning In the setting of new EKG changes ST depressions V5 and V6 seen yesterday Also with troponin elevation 0.44 now trending down CT scan images reviewed by myself showing significant coronary calcifications Symptoms concerning for unstable angina Discussed various treatment strategies and workup available After long discussion, recommended cardiac catheterization given worsening symptoms of chest pain radiating to her shoulder and back I have reviewed the risks, indications, and alternatives to cardiac catheterization, possible angioplasty, and stenting with the patient. Risks include but are not limited to bleeding, infection, vascular injury, stroke, myocardial infection, arrhythmia, kidney injury, radiation-related injury in the case of prolonged fluoroscopy use, emergency cardiac surgery, and death. The patient understands the risks of serious complication is 1-2 in 4034 with diagnostic cardiac cath and 1-2% or less with angioplasty/stenting.  Cardiac catheterization scheduled tomorrow at 10:30 with Dr. Fletcher Anon Continue heparin, will discontinue heparin prior to cardiac catheterization  2) smoker/COPD We have encouraged her to continue to work on weaning her cigarettes and smoking cessation. She will continue to work on this and  does not want any assistance with chantix.   3) acute respiratory distress  Possible bacterial pneumonia, seen on CT scan chest She is on broad-spectrum antibiotics, appears comfortable  4) hyponatremia Currently on normal saline  5) anemia Hematocrit 30 Would recommend iron studies, guaiac   Total encounter time more than 110 minutes  Greater than 50% was spent in counseling and coordination of care with the patient   Signed, Ida Rogue, MD  03/04/2017 12:01 PM

## 2017-03-05 NOTE — Progress Notes (Signed)
Patient remains clinically stable post heart cath without intervention. Family at bedside. Vitals have remained stable. Right radial with tr band intact, no bleeding nor hematoma at site. Elevated on pillow. Dr Fletcher Anon out to speak with patient and family with questions answered regarding procedure. Denies complaints at this time. Report called to care nurse with plan reviewed.

## 2017-03-05 NOTE — Progress Notes (Signed)
Patient remains clinically stable post cath, air completely removed with no visible bleeding nor hematoma. Getting echo done at this time.called and consulted with Dr Ether Griffins regarding pt complaining of severe burning with frequent urination during her stay in post op, ordering urine specimen

## 2017-03-05 NOTE — Progress Notes (Signed)
*  PRELIMINARY RESULTS* Echocardiogram 2D Echocardiogram has been performed.  Christina Hester 03/05/2017, 3:56 PM

## 2017-03-05 NOTE — Consult Note (Signed)
ANTICOAGULATION CONSULT NOTE - Initial Consult  Pharmacy Consult for heparin drip Indication: chest pain/ACS  Allergies  Allergen Reactions  . Aspirin Tinitus  . Prednisone Other (See Comments)    Makes violent  . Sulfa Antibiotics Nausea And Vomiting  . Symbicort [Budesonide-Formoterol Fumarate] Other (See Comments)    Patient felt "out of her mind" and angry.    Patient Measurements: Height: 5\' 1"  (154.9 cm) Weight: 93 lb 11.2 oz (42.5 kg) IBW/kg (Calculated) : 47.8 Heparin Dosing Weight: 40.8kg  Vital Signs: Temp: 98 F (36.7 C) (08/19 2051) Temp Source: Oral (08/19 2051) BP: 124/98 (08/19 2051) Pulse Rate: 108 (08/19 2051)  Labs:  Recent Labs  03/02/17 0747 03/03/17 0351 03/03/17 1341 03/03/17 1721 03/03/17 2131 03/04/17 0339 03/04/17 0530 03/04/17 1141 03/04/17 2248  HGB  --  10.2*  --   --   --   --  10.6*  --   --   HCT  --  29.0*  --   --   --   --  30.6*  --   --   PLT  --  224  --   --   --   --  285  --   --   APTT  --   --   --   --  72*  --   --   --   --   LABPROT  --   --   --   --  14.6  --   --   --   --   INR  --   --   --   --  1.13  --   --   --   --   HEPARINUNFRC  --   --   --   --   --  <0.10*  --  <0.10* 0.25*  CREATININE 0.68  --   --   --   --   --   --   --   --   TROPONINI  --   --  0.06* 0.44* 0.40*  --   --   --   --     Estimated Creatinine Clearance: 40.8 mL/min (by C-G formula based on SCr of 0.68 mg/dL).   Medical History: Past Medical History:  Diagnosis Date  . AAA (abdominal aortic aneurysm) ([place])   . COPD (chronic obstructive pulmonary disease) ([place])   . Hypertension     Medications:  Scheduled:  . aspirin  81 mg Oral Pre-Cath  . aspirin EC  81 mg Oral Daily  . atorvastatin  80 mg Oral q1800  . azithromycin  250 mg Oral Daily  . chlorpheniramine-HYDROcodone  5 mL Oral Q12H  . feeding supplement (ENSURE ENLIVE)  237 mL Oral Q24H  . guaiFENesin  600 mg Oral BID  . heparin  650 Units Intravenous Once  .  ipratropium-albuterol  3 mL Nebulization Q4H WA  . loratadine  10 mg Oral Daily  . methylPREDNISolone (SOLU-MEDROL) injection  125 mg Intravenous Once  . metoprolol tartrate  25 mg Oral BID  . multivitamin  15 mL Oral Daily  . nicotine  21 mg Transdermal Daily  . nitroGLYCERIN  0.5 inch Topical Q6H  . predniSONE  20 mg Oral Q breakfast   Followed by  . [START ON 03/06/2017] predniSONE  10 mg Oral Q breakfast   Followed by  . [START ON 03/07/2017] predniSONE  5 mg Oral Q breakfast  . tiotropium  18 mcg Inhalation Daily    Assessment: Pt  is a 76 year old female who was admitted with SOB, chest pain, hypokalemia, weakness.   Goal of Therapy:  Heparin level 0.3-0.7 units/ml Monitor platelets by anticoagulation protocol: Yes   Plan:  Will bolus heparin 1300 units iv once and will increase infusion to 800 units/hr. Will recheck a HL 8 hours after rate increase.   8/19 @ 2248 HL 0.25 subtherapeutic. Will rebolus w/ heparin 650 units IV x 1 and increase rate to 850 units/hr and will recheck HL @ 0700.  Tobie Lords, PharmD, BCPS Clinical Pharmacist 03/05/2017,12:23 AM

## 2017-03-05 NOTE — Interval H&P Note (Signed)
History and Physical Interval Note:  1/61/0960 45:40 AM  Christina Hester  has presented today for surgery, with the diagnosis of unstable angina, elevated troponin  The various methods of treatment have been discussed with the patient and family. After consideration of risks, benefits and other options for treatment, the patient has consented to  Procedure(s): Left Heart Cath with poss PCI (Bilateral) as a surgical intervention .  The patient's history has been reviewed, patient examined, no change in status, stable for surgery.  I have reviewed the patient's chart and labs.  Questions were answered to the patient's satisfaction.     Kathlyn [place]

## 2017-03-05 NOTE — Progress Notes (Signed)
[place] [facility] [place] NAME: Christina Hester    MR#:  [id number]  DATE OF BIRTH:  [birth year]  SUBJECTIVE:  CHIEF COMPLAINT:   Chief Complaint  Patient presents with  . Shortness of Breath  . Chest Pain  . Emesis   Patient is 76 year old Caucasian female with past medical history significant for history of nonproductive cough, shortness of breath, left-sided chest pain, nausea and vomiting, generalized weakness for about 1 day. Patient's chest x-ray revealed bronchitis, CT angiogram of the chest revealed multifocal pneumonia. Patient is initiated on Rocephin and Zithromax and admitted. The patient was about to be discharged home, however, developed significant left-sided chest pain eft arm and back, troponins came back elevated The patient was initiated on metoprolol, nitroglycerin, aspirin, heparin  ntravenously. Cardiologist recommended cardiac catheterization, to be performed today. Patient feels good today, denies any shortness of breath or chest pain.    Review of Systems  Constitutional: Negative for chills, fever and weight loss.  HENT: Negative for congestion.   Eyes: Negative for blurred vision and double vision.  Respiratory: Positive for cough. Negative for sputum production, shortness of breath and wheezing.   Cardiovascular: Negative for chest pain, palpitations, orthopnea, leg swelling and PND.  Gastrointestinal: Negative for abdominal pain, blood in stool, constipation, diarrhea, nausea and vomiting.  Genitourinary: Negative for dysuria, frequency, hematuria and urgency.  Musculoskeletal: Negative for falls.  Neurological: Negative for dizziness, tremors, focal weakness and headaches.  Endo/Heme/Allergies: Does not bruise/bleed easily.  Psychiatric/Behavioral: Negative for depression. The patient does not have insomnia.     VITAL SIGNS: Blood pressure (!) 168/80, pulse 79, temperature 98.4 F (36.9 C), temperature  source Oral, resp. rate 20, height 5\' 1"  (1.549 m), weight 42.2 kg (93 lb), SpO2 93 %.  PHYSICAL EXAMINATION:   GENERAL:  76 y.o.-year-old pati, sitting on  the bed, just coming from the bathroom, in no significant respiratory distress, breathing is calm EYES: Pupils equal, round, reactive to light and accommodation. No scleral icterus. Extraocular muscles intact.  HEENT: Head atraumatic, normocephalic. Oropharynx and nasopharynx clear.  NECK:  Supple, no jugular venous distention. No thyroid enlargement, no tenderness.  LUNGS: Diminished breath sounds bilaterally posteriorly,some scattered wheezing, no  rales,rhonchi and crepitations noted bilaterally. No use of accessory muscles of respiration.  CARDIOVASCULAR: S1, S2 normal. No murmurs, rubs, or gallops.  ABDOMEN: Soft, nontender, nondistended. Bowel sounds present. No organomegaly or mass.  EXTREMITIES: No pedal edema, cyanosis, or clubbing.  NEUROLOGIC: Cranial nerves II through XII are intact. Muscle strength 5/5 in all extremities. Sensation intact. Gait not checked.  PSYCHIATRIC: The patient is alert and oriented x 3.  SKIN: No obvious rash, lesion, or ulcer.   ORDERS/RESULTS REVIEWED:   CBC  Recent Labs Lab 03/01/17 2347 03/03/17 0351 03/04/17 0530 03/05/17 0650  WBC 12.9* 11.1* 12.8* 11.2*  HGB 11.6* 10.2* 10.6* 11.3*  HCT 33.0* 29.0* 30.6* 33.5*  PLT 237 224 285 334  MCV 88.8 91.1 90.7 92.7  MCH 31.3 31.9 31.6 31.3  MCHC 35.3 35.1 34.8 33.8  RDW 12.9 12.6 13.1 13.1   ------------------------------------------------------------------------------------------------------------------  Chemistries   Recent Labs Lab 03/01/17 2347 03/02/17 0747 03/03/17 0351 03/03/17 1341 03/05/17 0650  NA 120* 128* 131* 132* 136  K 4.1 4.4  --   --  4.2  CL 87* 94*  --   --  100*  CO2 24 27  --   --  29  GLUCOSE 117* 101*  --   --  82  BUN 7 6  --   --  15  CREATININE 0.57 0.68  --   --  0.64  CALCIUM 8.9 8.9  --   --  8.9    AST 23  --   --   --   --   ALT 14  --   --   --   --   ALKPHOS 90  --   --   --   --   BILITOT 0.8  --   --   --   --    ------------------------------------------------------------------------------------------------------------------ estimated creatinine clearance is 40.5 mL/min (by C-G formula based on SCr of 0.64 mg/dL). ------------------------------------------------------------------------------------------------------------------  Recent Labs  03/03/17 1721  TSH 0.456    Cardiac Enzymes  Recent Labs Lab 03/03/17 1341 03/03/17 1721 03/03/17 2131  TROPONINI 0.06* 0.44* 0.40*   ------------------------------------------------------------------------------------------------------------------ Invalid input(s): POCBNP ---------------------------------------------------------------------------------------------------------------  RADIOLOGY: No results found.  EKG:  Orders placed or performed during the hospital encounter of 03/02/17  . ED EKG within 10 minutes  . ED EKG within 10 minutes  . EKG 12-Lead  . EKG 12-Lead  . EKG 12-Lead  . EKG 12-Lead  . EKG 12-Lead  . 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 Acute respiratory failure with hypoxia, patient was  weaned off oxygen , now on room air, O2 sats is stable at 93%, patient was  ambulated yesterday, and her oxygenation remained stable. #2. COPD exacerbation due to pneumonia,  Continue intravenous steroids, patient refuses to take any oral steroids at home, continue Tiotropium, nebulizing therapy every 4 hours, improved clinically #3. Non-Q-wave MI, continue patient on aspirin, nitroglycerin, metoprolol, heparin intravenously, the patient was seen by  Dr. Candis Musa ,  recommended cardiac catheterization today, to be performed by Dr. Fletcher Anon , no pulmonary embolism on CT angiogram #4. Bacterial pneumonia, the patient was managed on Rocephin and Zithromax while in the hospital, she we will be discharged on  Cefdinir and Zithromax, to continue these medications for 5 more days, unable to get sputum , however, patient improved clinically, appreciate speech therapist input, aspiration precautions were recommended and soft diet #5. Essential hypertension, continue  metoprolol, nitroglycerin topically , add  Norvasc as blood pressure remains high, ACE inhibitor has been stopped due to allergy  #6. Hyponatremia,  , resolved on IV fluid administration, likely dehydration related.  #7 tobacco abuse counseling, discussed this patient for 4 minutes during this admission, nicotine replacement therapy to be continued, she was agreeable  #8. Leukocytosis, some improved, suspected due to steroids,  follow as outpatient   Management plans discussed with the patient, family and they are in agreement.   DRUG ALLERGIES:  Allergies  Allergen Reactions  . Aspirin Tinitus  . Prednisone Other (See Comments)    Makes violent  . Sulfa Antibiotics Nausea And Vomiting  . Symbicort [Budesonide-Formoterol Fumarate] Other (See Comments)    Patient felt "out of her mind" and angry.    CODE STATUS:  Code Status History    Date Active Date Inactive Code Status Order ID Comments User Context   07/18/2016  6:24 PM 07/20/2016  8:41 PM Full Code [id number]  Vaughan Basta, MD ED   04/28/2016  1:56 AM 04/29/2016  1:28 PM Full Code [id number]  Harvie Bridge, DO Inpatient   11/25/2015  7:33 PM 11/26/2015  3:19 PM Full Code [id number]  Nicholes Mango, MD Inpatient      TOTAL TIME TAKING CARE OF THIS PATIENT: 40 minutes.  Preparing for possible discharge   Lagena Strand M.D on 03/05/2017 at 11:11 AM  Between 7am to 6pm - Pager - [phone]  After 6pm go to [email or web address] - password  [email or web address]  Office  [phone]  CC: Primary care physician; Ronnell Freshwater, NP

## 2017-03-05 NOTE — Progress Notes (Signed)
SLP Cancellation Note  Patient Details Name: Christina Hester MRN: [id number] DOB: [birth year]   Cancelled treatment:       Reason Eval/Treat Not Completed: Patient at procedure or test/unavailable. Pt currently NPO for procedure today, and is off unit at this time. Will continue efforts to assess diet tolerance.   Shaquaya Wuellner B. Quentin Ore [facility], CCC-SLP Speech Pathologist [phone]  Shonna Chock 03/05/2017, 11:13 AM

## 2017-03-05 NOTE — Progress Notes (Signed)
Awake and oriented. Requesting breathing treatment having difficulty breathing. Both groins shaved this am. Resp therapist notified and is in room at this time. Pt has been NPO after midnignt.

## 2017-03-05 NOTE — Consult Note (Signed)
ANTICOAGULATION CONSULT NOTE - Initial Consult  Pharmacy Consult for heparin drip Indication: chest pain/ACS  Allergies  Allergen Reactions  . Aspirin Tinitus  . Prednisone Other (See Comments)    Makes violent  . Sulfa Antibiotics Nausea And Vomiting  . Symbicort [Budesonide-Formoterol Fumarate] Other (See Comments)    Patient felt "out of her mind" and angry.    Patient Measurements: Height: 5\' 1"  (154.9 cm) Weight: 93 lb 11.2 oz (42.5 kg) IBW/kg (Calculated) : 47.8 Heparin Dosing Weight: 40.8kg  Vital Signs: Temp: 98.4 F (36.9 C) (08/20 0719) Temp Source: Oral (08/20 0719) BP: 157/71 (08/20 0719) Pulse Rate: 79 (08/20 0719)  Labs:  Recent Labs  03/02/17 0747  03/03/17 0351 03/03/17 1341 03/03/17 1721 03/03/17 2131  03/04/17 0530 03/04/17 1141 03/04/17 2248 03/05/17 0650  HGB  --   < > 10.2*  --   --   --   --  10.6*  --   --  11.3*  HCT  --   --  29.0*  --   --   --   --  30.6*  --   --  33.5*  PLT  --   --  224  --   --   --   --  285  --   --  334  APTT  --   --   --   --   --  72*  --   --   --   --   --   LABPROT  --   --   --   --   --  14.6  --   --   --   --   --   INR  --   --   --   --   --  1.13  --   --   --   --   --   HEPARINUNFRC  --   --   --   --   --   --   < >  --  <0.10* 0.25* 0.52  CREATININE 0.68  --   --   --   --   --   --   --   --   --   --   TROPONINI  --   --   --  0.06* 0.44* 0.40*  --   --   --   --   --   < > = values in this interval not displayed.  Estimated Creatinine Clearance: 40.8 mL/min (by C-G formula based on SCr of 0.68 mg/dL).   Medical History: Past Medical History:  Diagnosis Date  . AAA (abdominal aortic aneurysm) ([place])   . COPD (chronic obstructive pulmonary disease) ([place])   . Hypertension     Medications:  Scheduled:  . aspirin EC  81 mg Oral Daily  . atorvastatin  80 mg Oral q1800  . azithromycin  250 mg Oral Daily  . chlorpheniramine-HYDROcodone  5 mL Oral Q12H  . feeding supplement (ENSURE  ENLIVE)  237 mL Oral Q24H  . guaiFENesin  600 mg Oral BID  . ipratropium-albuterol  3 mL Nebulization Q4H WA  . loratadine  10 mg Oral Daily  . methylPREDNISolone (SOLU-MEDROL) injection  125 mg Intravenous Once  . metoprolol tartrate  25 mg Oral BID  . multivitamin  15 mL Oral Daily  . nicotine  21 mg Transdermal Daily  . nitroGLYCERIN  0.5 inch Topical Q6H  . predniSONE  20 mg Oral Q breakfast   Followed  by  . Derrill Memo ON 03/06/2017] predniSONE  10 mg Oral Q breakfast   Followed by  . [START ON 03/07/2017] predniSONE  5 mg Oral Q breakfast  . tiotropium  18 mcg Inhalation Daily    Assessment: Pt is a 76 year old female who was admitted with SOB, chest pain, hypokalemia, weakness.   Goal of Therapy:  Heparin level 0.3-0.7 units/ml Monitor platelets by anticoagulation protocol: Yes   Plan:  Will bolus heparin 1300 units iv once and will increase infusion to 800 units/hr. Will recheck a HL 8 hours after rate increase.   8/19 @ 2248 HL 0.25 subtherapeutic. Will rebolus w/ heparin 650 units IV x 1 and increase rate to 850 units/hr and will recheck HL @ 0700.  8/20 @ 0700 HL 0.52 therapeutic. Will continue current rate and will recheck HL @ 1500. CBC stable.  Tobie Lords, PharmD, BCPS Clinical Pharmacist 03/05/2017,7:21 AM

## 2017-03-06 ENCOUNTER — Encounter: Payer: Self-pay | Admitting: Cardiovascular Disease

## 2017-03-06 DIAGNOSIS — Z9889 Other specified postprocedural states: Secondary | ICD-10-CM

## 2017-03-06 DIAGNOSIS — I2510 Atherosclerotic heart disease of native coronary artery without angina pectoris: Secondary | ICD-10-CM

## 2017-03-06 MED ORDER — NITROGLYCERIN 0.4 MG SL SUBL: 0.4000 mg | SUBLINGUAL_TABLET | SUBLINGUAL | 12 refills | Status: DC | PRN

## 2017-03-06 MED ORDER — AMLODIPINE BESYLATE 5 MG PO TABS: 5.0000 mg | ORAL_TABLET | Freq: Every day | ORAL | 3 refills | Status: DC

## 2017-03-06 MED ORDER — METOPROLOL TARTRATE 25 MG PO TABS: 25.0000 mg | ORAL_TABLET | Freq: Two times a day (BID) | ORAL | 3 refills | Status: DC

## 2017-03-06 MED ORDER — HYDROCOD POLST-CPM POLST ER 10-8 MG/5ML PO SUER
5.0000 mL | Freq: Two times a day (BID) | ORAL | 0 refills | Status: DC
Start: 2017-03-06 — End: 2017-03-12

## 2017-03-06 MED ORDER — ASPIRIN 81 MG PO TBEC: 81.0000 mg | DELAYED_RELEASE_TABLET | Freq: Every day | ORAL | 3 refills | Status: DC

## 2017-03-06 MED ORDER — IPRATROPIUM-ALBUTEROL 0.5-2.5 (3) MG/3ML IN SOLN: 3.0000 mL | Freq: Four times a day (QID) | RESPIRATORY_TRACT | Status: DC | PRN

## 2017-03-06 MED ORDER — ATORVASTATIN CALCIUM 80 MG PO TABS: 80.0000 mg | ORAL_TABLET | Freq: Every day | ORAL | 3 refills | Status: DC

## 2017-03-06 NOTE — Discharge Summary (Signed)
Christina Hester at [place] NAME: Kyarra Vancamp    MR#:  [id number]  DATE OF BIRTH:  [birth year]  DATE OF ADMISSION:  03/02/2017 ADMITTING PHYSICIAN: Harrie Foreman, MD  DATE OF DISCHARGE: 03/06/2017 12:52 PM  PRIMARY CARE PHYSICIAN: Ronnell Freshwater, NP     ADMISSION DIAGNOSIS:  Shortness of breath [R06.02] Hyponatremia [E87.1] Weakness [R53.1] COPD exacerbation (HCC) [J44.1] Chest pain, unspecified type [R07.9]  DISCHARGE DIAGNOSIS:  Active Problems:   Acute on chronic respiratory failure (HCC)   COPD with acute exacerbation (HCC)   Left sided chest pain   Multifocal pneumonia   Hyponatremia   Dehydration   Leukocytosis   Unstable angina (HCC)   Demand ischemia (HCC)   Coronary artery disease   S/P cardiac cath   SECONDARY DIAGNOSIS:   Past Medical History:  Diagnosis Date  . AAA (abdominal aortic aneurysm) ([place])   . COPD (chronic obstructive pulmonary disease) ([place])   . Hypertension     .pro HOSPITAL COURSE:   Patient is 76 year old Caucasian female with past medical history significant for history of nonproductive cough, shortness of breath, left-sided chest pain, nausea and vomiting, generalized weakness for about 1 day. Patient's chest x-ray revealed bronchitis, CT angiogram of the chest revealed multifocal pneumonia. Patient is initiated on Rocephin and Zithromax and admitted. While in the hospital the patient  developed significant left-sided chest pain, radiating to the left arm and back, troponins came back elevated to maximum level of 0.44. The patient was seen by cardiologist, who recommended cardiac catheterization . Cardiac catheterization was performed 03/05/2017, revealed severe one-vessel coronary artery disease with chronically occluded right coronary artery with well-developed left-to-right collaterals. Mild to moderate nonobstructive disease affecting the left system with extremely tortuous coronary  arteries. Normal LV systolic function and mildly elevated left ventricular end-diastolic pressure. Echocardiogram was remarkable for grade 1 diastolic dysfunction, LVH, mild mitral regurgitation, normal ejection fraction. The patient was advised to continue metoprolol, aspirin, Lipitor, however, she refused to take Lipitor due to intolerance. She was recommended to follow up with cardiologist as outpatient and discuss lipid management with them. Meanwhile, for multifocal pneumonia, patient was to continue azithromycin, and Omnicef to complete course. She was advised to quit smoking. She was felt to be stable to be discharged home today Discussion by problem: #1. Acute respiratory failure with hypoxia, patient was weanedoff oxygen , now on room air, O2 sats is stable at 93 to 94 %, patient was  ambulated , and her oxygenation remained stable. #2. COPD exacerbation due to pneumonia,   the patient was advised to continue tapering steroids, however, she refused to take any oral steroids at home, continue Tiotropium, nebulizing therapy every 4 hours, improved clinically #3. Unstable angina with elevated troponin, likely demand ischemia, no obvious  MI, per cardiology, the patient is to continue aspirin,  metoprolol, statin, which she refused to do due to intolerance, the patient is to follow-up with  Dr. Candis Musa as outpatient and discuss lipid management, no pulmonary embolism on CT angiogram #4. Bacterial pneumonia, the patient was managed on Rocephin and Zithromax while in the hospital, she is being discharged on Cefdinir and Zithromax, to continue these medications for 5 more days, unable to get sputum , however, patient improved clinically, appreciatespeech therapist input, aspiration precautions were recommended and soft diet #5. Essential hypertension, continue  metoprolol and  Norvasc, ACE inhibitor has been stopped due to allergy #6. Hyponatremia,  , resolved on IV fluid administration,  likely  dehydration related.  #7 tobacco abuse counseling, discussed this patient for 4 minutes during this admission, nicotine replacement therapy to be continued, she was agreeable  #8. Leukocytosis, some improved, suspected due to steroids,  follow as outpatient   DISCHARGE CONDITIONS:   Stable  CONSULTS OBTAINED:  Treatment Team:  Minna Merritts, MD  DRUG ALLERGIES:   Allergies  Allergen Reactions  . Aspirin Tinitus  . Prednisone Other (See Comments)    Makes violent  . Sulfa Antibiotics Nausea And Vomiting  . Symbicort [Budesonide-Formoterol Fumarate] Other (See Comments)    Patient felt "out of her mind" and angry.    DISCHARGE MEDICATIONS:   Discharge Medication List as of 03/06/2017 11:40 AM    START taking these medications   Details  aspirin EC 81 MG EC tablet Take 1 tablet (81 mg total) by mouth daily., Starting Tue 03/06/2017, Normal    atorvastatin (LIPITOR) 80 MG tablet Take 1 tablet (80 mg total) by mouth daily at 6 PM., Starting Tue 03/06/2017, Normal    azithromycin (ZITHROMAX) 250 MG tablet One pill once daily until finished, thank you, Normal    budesonide-formoterol (SYMBICORT) 160-4.5 MCG/ACT inhaler Inhale 2 puffs into the lungs 2 (two) times daily., Starting Sat 03/03/2017, Normal    cefdinir (OMNICEF) 300 MG capsule Take 1 capsule (300 mg total) by mouth 2 (two) times daily., Starting Sat 03/03/2017, Normal    feeding supplement, ENSURE ENLIVE, (ENSURE ENLIVE) LIQD Take 237 mLs by mouth daily., Starting Sun 03/04/2017, Normal    guaiFENesin (MUCINEX) 600 MG 12 hr tablet Take 1 tablet (600 mg total) by mouth 2 (two) times daily., Starting Sat 03/03/2017, Normal    metoprolol tartrate (LOPRESSOR) 25 MG tablet Take 1 tablet (25 mg total) by mouth 2 (two) times daily., Starting Tue 03/06/2017, Normal    nicotine (NICODERM CQ - DOSED IN MG/24 HOURS) 21 mg/24hr patch Place 1 patch (21 mg total) onto the skin daily., Starting Sun 03/04/2017, Normal    nitroGLYCERIN  (NITROSTAT) 0.4 MG SL tablet Place 1 tablet (0.4 mg total) under the tongue every 5 (five) minutes as needed for chest pain., Starting Tue 03/06/2017, Normal    predniSONE (STERAPRED UNI-PAK 21 TAB) 10 MG (21) TBPK tablet Please take 6 pills in the morning on the day 1, then taper by one pill daily until finished, thank you, Normal    trolamine salicylate (ASPERCREME) 10 % cream Apply topically as needed for muscle pain., Starting Sat 03/03/2017, Normal    chlorpheniramine-HYDROcodone (TUSSIONEX) 10-8 MG/5ML SUER Take 5 mLs by mouth every 12 (twelve) hours., Starting Sat 03/03/2017, Normal      CONTINUE these medications which have CHANGED   Details  amLODipine (NORVASC) 5 MG tablet Take 1 tablet (5 mg total) by mouth daily., Starting Tue 03/06/2017, Normal    ipratropium-albuterol (DUONEB) 0.5-2.5 (3) MG/3ML SOLN Take 3 mLs by nebulization every 4 (four) hours., Starting Sat 03/03/2017, Normal      CONTINUE these medications which have NOT CHANGED   Details  albuterol (PROVENTIL HFA;VENTOLIN HFA) 108 (90 Base) MCG/ACT inhaler Inhale 2 puffs into the lungs every 6 (six) hours as needed for wheezing or shortness of breath., Until Discontinued, Historical Med    cetirizine (ZYRTEC) 10 MG tablet Take 1 tablet by mouth daily., Historical Med    tiotropium (SPIRIVA) 18 MCG inhalation capsule Place 18 mcg into inhaler and inhale daily., Historical Med    Omega-3 Fatty Acids (FISH OIL PO) Take 1 capsule by mouth daily., Historical  Med      STOP taking these medications     lisinopril (PRINIVIL,ZESTRIL) 2.5 MG tablet          DISCHARGE INSTRUCTIONS:    The patient is to follow-up with primary care physician and cardiologist as outpatient  If you experience worsening of your admission symptoms, develop shortness of breath, life threatening emergency, suicidal or homicidal thoughts you must seek medical attention immediately by calling 911 or calling your MD immediately  if symptoms less  severe.  You Must read complete instructions/literature along with all the possible adverse reactions/side effects for all the Medicines you take and that have been prescribed to you. Take any new Medicines after you have completely understood and accept all the possible adverse reactions/side effects.   Please note  You were cared for by a hospitalist during your hospital stay. If you have any questions about your discharge medications or the care you received while you were in the hospital after you are discharged, you can call the unit and asked to speak with the hospitalist on call if the hospitalist that took care of you is not available. Once you are discharged, your primary care physician will handle any further medical issues. Please note that NO REFILLS for any discharge medications will be authorized once you are discharged, as it is imperative that you return to your primary care physician (or establish a relationship with a primary care physician if you do not have one) for your aftercare needs so that they can reassess your need for medications and monitor your lab values.    Today   CHIEF COMPLAINT:   Chief Complaint  Patient presents with  . Shortness of Breath  . Chest Pain  . Emesis    HISTORY OF PRESENT ILLNESS:     VITAL SIGNS:  Blood pressure 131/65, pulse 84, temperature 98.8 F (37.1 C), temperature source Oral, resp. rate 20, height 5\' 1"  (1.549 m), weight 42.2 kg (93 lb), SpO2 94 %.  I/O:   Intake/Output Summary (Last 24 hours) at 03/06/17 1259 Last data filed at 03/06/17 0800  Gross per 24 hour  Intake             2790 ml  Output              750 ml  Net             2040 ml    PHYSICAL EXAMINATION:  GENERAL:  76 y.o.-year-old patient lying in the bed with no acute distress.  EYES: Pupils equal, round, reactive to light and accommodation. No scleral icterus. Extraocular muscles intact.  HEENT: Head atraumatic, normocephalic. Oropharynx and nasopharynx  clear.  NECK:  Supple, no jugular venous distention. No thyroid enlargement, no tenderness.  LUNGS: Normal breath sounds bilaterally, no wheezing, rales,rhonchi or crepitation. No use of accessory muscles of respiration.  CARDIOVASCULAR: S1, S2 normal. No murmurs, rubs, or gallops.  ABDOMEN: Soft, non-tender, non-distended. Bowel sounds present. No organomegaly or mass.  EXTREMITIES: No pedal edema, cyanosis, or clubbing.  NEUROLOGIC: Cranial nerves II through XII are intact. Muscle strength 5/5 in all extremities. Sensation intact. Gait not checked.  PSYCHIATRIC: The patient is alert and oriented x 3.  SKIN: No obvious rash, lesion, or ulcer.   DATA REVIEW:   CBC  Recent Labs Lab 03/05/17 0650  WBC 11.2*  HGB 11.3*  HCT 33.5*  PLT 334    Chemistries   Recent Labs Lab 03/01/17 2347  03/05/17 0650  NA 120*  < >  136  K 4.1  < > 4.2  CL 87*  < > 100*  CO2 24  < > 29  GLUCOSE 117*  < > 82  BUN 7  < > 15  CREATININE 0.57  < > 0.64  CALCIUM 8.9  < > 8.9  AST 23  --   --   ALT 14  --   --   ALKPHOS 90  --   --   BILITOT 0.8  --   --   < > = values in this interval not displayed.  Cardiac Enzymes  Recent Labs Lab 03/03/17 2131  TROPONINI 0.40*    Microbiology Results  Results for orders placed or performed during the hospital encounter of 03/02/17  Culture, expectorated sputum-assessment     Status: None   Collection Time: 03/02/17  9:22 PM  Result Value Ref Range Status   Specimen Description SPU  Final   Special Requests NONE  Final   Sputum evaluation   Final    Sputum specimen not acceptable for testing.  Please recollect.   C/TRACEY TOOMBS AT 2312 03/02/17.PMH   Report Status 03/02/2017 FINAL  Final    RADIOLOGY:  No results found.  EKG:   Orders placed or performed during the hospital encounter of 03/02/17  . ED EKG within 10 minutes  . ED EKG within 10 minutes  . EKG 12-Lead  . EKG 12-Lead  . EKG 12-Lead  . EKG 12-Lead  . EKG 12-Lead  . EKG  12-Lead      Management plans discussed with the patient, family and they are in agreement.  CODE STATUS:     Code Status Orders        Start     Ordered   03/05/17 1240  Full code  Continuous     03/05/17 1239    Code Status History    Date Active Date Inactive Code Status Order ID Comments User Context   07/18/2016  6:24 PM 07/20/2016  8:41 PM Full Code [id number]  Vaughan Basta, MD ED   04/28/2016  1:56 AM 04/29/2016  1:28 PM Full Code [id number]  Harvie Bridge, DO Inpatient   11/25/2015  7:33 PM 11/26/2015  3:19 PM Full Code [id number]  Nicholes Mango, MD Inpatient      TOTAL TIME TAKING CARE OF THIS PATIENT: 40  minutes.    Theodoro Grist M.D on 03/06/2017 at 12:59 PM  Between 7am to 6pm - Pager - [phone]  After 6pm go to [email or web address] - password [email or web address]  Office  [phone]  CC: Primary care physician; Ronnell Freshwater, NP

## 2017-03-06 NOTE — Care Management (Signed)
Case dicussed with attending. No home care needs identified. Case closed.

## 2017-03-06 NOTE — Progress Notes (Signed)
Patient discharged home via w/c and daughter in private vehicle. Discharge instructions reviewed. IV removed x 2 without complication. Questions answered. Patient denies further questions at discharge.

## 2017-03-06 NOTE — Care Management Important Message (Signed)
Important Message  Patient Details  Name: Christina Hester MRN: [id number] Date of Birth: 09/10/1940   Medicare Important Message Given:  Yes    Jolly Mango, RN 03/06/2017, 8:43 AM

## 2017-03-07 LAB — URINE CULTURE: CULTURE: NO GROWTH

## 2017-03-12 ENCOUNTER — Ambulatory Visit (INDEPENDENT_AMBULATORY_CARE_PROVIDER_SITE_OTHER): Payer: Medicare Other | Admitting: Cardiovascular Disease

## 2017-03-12 ENCOUNTER — Encounter: Payer: Self-pay | Admitting: Cardiovascular Disease

## 2017-03-12 VITALS — BP 160/92 | HR 67 | Ht 60.0 in | Wt 84.0 lb

## 2017-03-12 DIAGNOSIS — E785 Hyperlipidemia, unspecified: Secondary | ICD-10-CM | POA: Diagnosis not present

## 2017-03-12 DIAGNOSIS — I10 Essential (primary) hypertension: Secondary | ICD-10-CM

## 2017-03-12 DIAGNOSIS — I200 Unstable angina: Secondary | ICD-10-CM | POA: Diagnosis not present

## 2017-03-12 DIAGNOSIS — Z720 Tobacco use: Secondary | ICD-10-CM

## 2017-03-12 MED ORDER — CLOPIDOGREL BISULFATE 75 MG PO TABS: 75.0000 mg | ORAL_TABLET | Freq: Every day | ORAL | 3 refills | Status: DC

## 2017-03-12 MED ORDER — LOSARTAN POTASSIUM 50 MG PO TABS: 50.0000 mg | ORAL_TABLET | Freq: Every day | ORAL | 3 refills | Status: DC

## 2017-03-12 NOTE — Patient Instructions (Signed)
Medication Instructions:  Your physician has recommended you make the following change in your medication:  START taking plavix 75mg  once a day. START taking losartan 50mg  once a day.   Labwork: BMET in one week at the [facility] lab. No appointment necessary.   Testing/Procedures: none  Follow-Up: Your physician recommends that you schedule a follow-up appointment in: 1 month with Dr. Fletcher Anon.    Any Other Special Instructions Will Be Listed Below (If Applicable).     If you need a refill on your cardiac medications before your next appointment, please call your pharmacy.

## 2017-03-12 NOTE — Progress Notes (Signed)
Cardiology Office Note   Date:  4/40/3474   ID:  Christina Hester, DOB March 18, 1941, MRN [id number]  PCP:  Remi Haggard, FNP  Cardiologist:   Kathlyn Sacramento, MD   Chief Complaint  Patient presents with  . other    follow up after cath. patient c/o blood pressure issues and chest pain but not sure if it is from COPD. Meds reviewed verbally with patient.       History of Present Illness: Christina Hester is a 76 y.o. female who presents for A follow-up visit after recent hospitalization for chest pain in the setting of COPD exacerbation. She is not to have COPD and prolonged history of tobacco use. Troponin was mildly elevated. CT scan showed coronary calcifications. I proceeded with cardiac catheterization via the right radial artery which showed severe one-vessel coronary artery disease with chronically occluded right coronary artery with well-developed left-to-right collaterals. There was mild to moderate nonobstructive disease affecting the left coronary arteries with extremely tortuous vessels. Ejection fraction was normal with mildly elevated left ventricular end-diastolic pressure. I recommended medical therapy. She quit smoking since hospital discharge. She continues to have intermittent episodes of substernal chest tightness. She took nitroglycerin once. Blood pressure has not been controlled at home.    Past Medical History:  Diagnosis Date  . AAA (abdominal aortic aneurysm) ([place])   . COPD (chronic obstructive pulmonary disease) ([place])   . Hypertension     Past Surgical History:  Procedure Laterality Date  . ABDOMINAL HYSTERECTOMY    . BREAST EXCISIONAL BIOPSY Left 20+ YRS AGO   NEG  . LEFT HEART CATH Bilateral 03/05/2017   Procedure: Left Heart Cath with poss PCI;  Surgeon: Wellington Hampshire, MD;  Location: [place] [facility];  Service: Cardiovascular;  Laterality: Bilateral;     Current Outpatient Prescriptions  Medication Sig Dispense Refill  . amLODipine  (NORVASC) 5 MG tablet Take 1 tablet (5 mg total) by mouth daily. 30 tablet 3  . buPROPion (WELLBUTRIN) 100 MG tablet Take 100 mg by mouth daily.    Marland Kitchen guaiFENesin (MUCINEX) 600 MG 12 hr tablet Take 1 tablet (600 mg total) by mouth 2 (two) times daily. 20 tablet 0  . ipratropium-albuterol (DUONEB) 0.5-2.5 (3) MG/3ML SOLN Take 3 mLs by nebulization every 4 (four) hours. 360 mL 3  . metoprolol tartrate (LOPRESSOR) 25 MG tablet Take 1 tablet (25 mg total) by mouth 2 (two) times daily. 60 tablet 3  . nitroGLYCERIN (NITROSTAT) 0.4 MG SL tablet Place 1 tablet (0.4 mg total) under the tongue every 5 (five) minutes as needed for chest pain. 20 tablet 12   No current facility-administered medications for this visit.     Allergies:   Aspirin; Prednisone; Sulfa antibiotics; and Symbicort [budesonide-formoterol fumarate]    Social History:  The patient  reports that she has quit smoking. She smoked 0.25 packs per day. She has never used smokeless tobacco. She reports that she does not drink alcohol or use drugs.   Family History:  The patient's family history includes Breast cancer in her maternal aunt, maternal aunt, maternal aunt, and mother.    ROS:  Please see the history of present illness.   Otherwise, review of systems are positive for none.   All other systems are reviewed and negative.    PHYSICAL EXAM: VS:  BP (!) 160/92 (BP Location: Left Arm, Patient Position: Sitting, Cuff Size: Normal)   Pulse 67   Ht 5' (1.524 m)   Wt  84 lb (38.1 kg)   BMI 16.41 kg/m  , BMI Body mass index is 16.41 kg/m. GEN: Well nourished, well developed, in no acute distress  HEENT: normal  Neck: no JVD, carotid bruits, or masses Cardiac: RRR; no murmurs, rubs, or gallops,no edema  Respiratory:  clear to auscultation bilaterally, normal work of breathing GI: soft, nontender, nondistended, + BS MS: no deformity or atrophy  Skin: warm and dry, no rash Neuro:  Strength and sensation are intact Psych: euthymic  mood, full affect Right radial pulse is normal with no hematoma.  EKG:  EKG is ordered today. The ekg ordered today demonstrates normal sinus rhythm with PACs.   Recent Labs: 04/27/2016: Magnesium 1.7 03/01/2017: ALT 14 03/03/2017: TSH 0.456 03/05/2017: BUN 15; Creatinine, Ser 0.64; Hemoglobin 11.3; Platelets 334; Potassium 4.2; Sodium 136    Lipid Panel    Component Value Date/Time   CHOL 219 (H) 11/26/2015 0412   TRIG 31 11/26/2015 0412   HDL 85 11/26/2015 0412   CHOLHDL 2.6 11/26/2015 0412   VLDL 6 11/26/2015 0412   LDLCALC 128 (H) 11/26/2015 0412      Wt Readings from Last 3 Encounters:  03/12/17 84 lb (38.1 kg)  03/05/17 93 lb (42.2 kg)  07/18/16 83 lb (37.6 kg)      No flowsheet data found.    ASSESSMENT AND PLAN:  1.  Coronary artery disease involving native coronary arteries with other forms of angina: She continues to have mild angina likely due to uncontrolled hypertension. She also reports inability to take aspirin for more than a few days due to ringing in her ears. I elected to add Plavix 75 mg once daily instead.  2. Hyperlipidemia: The patient does not want to take any statin because she is worried about myalgia as all family members are not able to take statins. She herself has not been on a statin and she might be willing to consider a small dose rosuvastatin.  3. Tobacco use: I congratulated her on smoking cessation.  4. Essential hypertension: Blood pressure is not controlled. I added losartan 50 mg daily. Check basic metabolic profile in one week.    Disposition:   FU with me in 1 month  Signed,  Kathlyn Sacramento, MD  03/12/2017 1:20 PM    [facility]

## 2017-03-22 DIAGNOSIS — I10 Essential (primary) hypertension: Secondary | ICD-10-CM | POA: Diagnosis not present

## 2017-03-22 DIAGNOSIS — F17210 Nicotine dependence, cigarettes, uncomplicated: Secondary | ICD-10-CM | POA: Diagnosis not present

## 2017-03-22 DIAGNOSIS — J449 Chronic obstructive pulmonary disease, unspecified: Secondary | ICD-10-CM | POA: Diagnosis not present

## 2017-03-22 DIAGNOSIS — R062 Wheezing: Secondary | ICD-10-CM | POA: Diagnosis not present

## 2017-03-23 ENCOUNTER — Other Ambulatory Visit
Admission: RE | Admit: 2017-03-23 | Discharge: 2017-03-23 | Disposition: A | Discharge status: Home / Self Care | Payer: Medicare Other | Source: Ambulatory Visit | Attending: Cardiovascular Disease | Admitting: Cardiovascular Disease

## 2017-03-23 DIAGNOSIS — I10 Essential (primary) hypertension: Secondary | ICD-10-CM | POA: Diagnosis not present

## 2017-03-23 LAB — BASIC METABOLIC PANEL
Anion gap: 9 (ref 5–15)
BUN: 17 mg/dL (ref 6–20)
CALCIUM: 9.3 mg/dL (ref 8.9–10.3)
CO2: 29 mmol/L (ref 22–32)
CREATININE: 1.04 mg/dL — AB (ref 0.44–1.00)
Chloride: 94 mmol/L — ABNORMAL LOW (ref 101–111)
GFR calc Af Amer: 59 mL/min — ABNORMAL LOW (ref >60)
GFR calc non Af Amer: 51 mL/min — ABNORMAL LOW (ref >60)
Glucose, Bld: 74 mg/dL (ref 65–99)
Potassium: 4.4 mmol/L (ref 3.5–5.1)
SODIUM: 132 mmol/L — AB (ref 135–145)

## 2017-03-29 ENCOUNTER — Other Ambulatory Visit: Payer: Self-pay

## 2017-03-29 DIAGNOSIS — R7989 Other specified abnormal findings of blood chemistry: Secondary | ICD-10-CM

## 2017-04-11 ENCOUNTER — Other Ambulatory Visit (INDEPENDENT_AMBULATORY_CARE_PROVIDER_SITE_OTHER): Payer: Medicare Other

## 2017-04-11 DIAGNOSIS — R7989 Other specified abnormal findings of blood chemistry: Secondary | ICD-10-CM | POA: Diagnosis not present

## 2017-04-12 ENCOUNTER — Other Ambulatory Visit: Payer: Medicare Other

## 2017-04-12 LAB — BASIC METABOLIC PANEL
BUN / CREAT RATIO: 20 (ref 12–28)
BUN: 18 mg/dL (ref 8–27)
CALCIUM: 9.6 mg/dL (ref 8.7–10.3)
CHLORIDE: 94 mmol/L — AB (ref 96–106)
CO2: 25 mmol/L (ref 20–29)
Creatinine, Ser: 0.88 mg/dL (ref 0.57–1.00)
GFR, EST AFRICAN AMERICAN: 74 mL/min/{1.73_m2} (ref >59)
GFR, EST NON AFRICAN AMERICAN: 64 mL/min/{1.73_m2} (ref >59)
Glucose: 87 mg/dL (ref 65–99)
POTASSIUM: 5 mmol/L (ref 3.5–5.2)
SODIUM: 132 mmol/L — AB (ref 134–144)

## 2017-04-17 ENCOUNTER — Ambulatory Visit (INDEPENDENT_AMBULATORY_CARE_PROVIDER_SITE_OTHER): Payer: Medicare Other | Admitting: Nurse Practitioner

## 2017-04-17 ENCOUNTER — Encounter: Payer: Self-pay | Admitting: Nurse Practitioner

## 2017-04-17 VITALS — BP 142/66 | HR 68 | Ht 60.0 in | Wt 85.8 lb

## 2017-04-17 DIAGNOSIS — E782 Mixed hyperlipidemia: Secondary | ICD-10-CM

## 2017-04-17 DIAGNOSIS — R0989 Other specified symptoms and signs involving the circulatory and respiratory systems: Secondary | ICD-10-CM | POA: Diagnosis not present

## 2017-04-17 DIAGNOSIS — I2510 Atherosclerotic heart disease of native coronary artery without angina pectoris: Secondary | ICD-10-CM | POA: Diagnosis not present

## 2017-04-17 DIAGNOSIS — Z720 Tobacco use: Secondary | ICD-10-CM

## 2017-04-17 DIAGNOSIS — I200 Unstable angina: Secondary | ICD-10-CM

## 2017-04-17 DIAGNOSIS — I10 Essential (primary) hypertension: Secondary | ICD-10-CM

## 2017-04-17 MED ORDER — LOSARTAN POTASSIUM 100 MG PO TABS: 100.0000 mg | ORAL_TABLET | Freq: Every day | ORAL | 3 refills | Status: DC

## 2017-04-17 MED ORDER — EZETIMIBE 10 MG PO TABS: 10.0000 mg | ORAL_TABLET | Freq: Every day | ORAL | 3 refills | Status: DC

## 2017-04-17 NOTE — Progress Notes (Signed)
Office Visit    Patient Name: Christina Hester Date of Encounter: 04/17/2017  Primary Care Provider:  Remi Haggard, FNP Primary Cardiologist:  Jerilynn Mages. Fletcher Anon, MD   Chief Complaint    A 76 year old female with a history of coronary artery disease, hypertension, hyperlipidemia, diastolic dysfunction, tobacco abuse, COPD, abdominal aortic aneurysm, and peripheral arterial disease, who presents for follow-up.  Past Medical History    Past Medical History:  Diagnosis Date  . AAA (abdominal aortic aneurysm) ([place])    a. 05/2016 Abd U/S: 3.02 x 3.02 AAA.  Marland Kitchen CAD (coronary artery disease)    a. 02/2017 NSTEMI/Cath: LM nl, LAD mild dzs, D2 min irregs, LCX mild dzs, OM2 50ost, RCA 95p, 125m, L-->R collats, EF 55-65%.  Marland Kitchen COPD (chronic obstructive pulmonary disease) ([place])   . Diastolic dysfunction    a. 02/2017 Echo: EF 55-60%, gr1 DD, ? inf HK, mild MR.  Marland Kitchen Hyperlipidemia   . Hypertension   . PAD (peripheral artery disease) ([place])    a. 05/2016 ABI's: R 1.22, L 1.11.  . Tobacco abuse    Past Surgical History:  Procedure Laterality Date  . ABDOMINAL HYSTERECTOMY    . BREAST EXCISIONAL BIOPSY Left 20+ YRS AGO   NEG  . LEFT HEART CATH Bilateral 03/05/2017   Procedure: Left Heart Cath with poss PCI;  Surgeon: Wellington Hampshire, MD;  Location: [place] [facility];  Service: Cardiovascular;  Laterality: Bilateral;    Allergies  Allergies  Allergen Reactions  . Aspirin Tinitus  . Prednisone Other (See Comments)    Makes violent  . Sulfa Antibiotics Nausea And Vomiting  . Symbicort [Budesonide-Formoterol Fumarate] Other (See Comments)    Patient felt "out of her mind" and angry.    History of Present Illness    76 year old female with the above complex past medical history including non-STEMI in August 2017 with catheterization revealing a total occlusion of the right coronary artery. She had left to right collaterals and normal LV function by ventriculogram and echocardiogram. Other  history includes hypertension, hyperlipidemia, ongoing tobacco abuse, COPD, abdominal aortic aneurysm, and peripheral arterial disease. She was last seen in clinic just about a month ago, at which time she was doing relatively well, though did report intermittent chest discomfort. She was not able to tolerate aspirin and thus it was discontinued. Plavix was added to her regimen. She was reluctant to initiate statin therapy but willing to think about it. Losartan was added to her regimen in the setting of hypertension.  Since her last visit, she has not had any recurrence of chest discomfort. She has smoked a few cigarettes over the past month. She knows that she needs to quit. She has chronic dyspnea on exertion with higher levels of activity but has been stable over the past month. She checks her blood pressure regularly and notes that is typically high first thing in the morning-160-170 and usually in the 130s to 140s throughout the day. She denies PND, orthopnea, dizziness, syncope, edema, or early satiety.  Home Medications    Prior to Admission medications   Medication Sig Start Date End Date Taking? Authorizing Provider  amLODipine (NORVASC) 5 MG tablet Take 1 tablet (5 mg total) by mouth daily. 03/06/17  Yes Theodoro Grist, MD  buPROPion (WELLBUTRIN) 100 MG tablet Take 100 mg by mouth daily.   Yes [provider]  clopidogrel (PLAVIX) 75 MG tablet Take 1 tablet (75 mg total) by mouth daily. 03/12/17  Yes Wellington Hampshire, MD  guaiFENesin (MUCINEX) 600 MG 12 hr tablet Take 1 tablet (600 mg total) by mouth 2 (two) times daily. 03/03/17  Yes Theodoro Grist, MD  ipratropium-albuterol (DUONEB) 0.5-2.5 (3) MG/3ML SOLN Take 3 mLs by nebulization every 4 (four) hours. Patient taking differently: Take 3 mLs by nebulization every 4 (four) hours as needed.  03/03/17  Yes Theodoro Grist, MD  losartan (COZAAR) 100 MG tablet Take 1 tablet (100 mg total) by mouth daily. 04/17/17 07/16/17 Yes Rogelia Mire, NP  metoprolol tartrate (LOPRESSOR) 25 MG tablet Take 1 tablet (25 mg total) by mouth 2 (two) times daily. 03/06/17  Yes Theodoro Grist, MD  nitroGLYCERIN (NITROSTAT) 0.4 MG SL tablet Place 1 tablet (0.4 mg total) under the tongue every 5 (five) minutes as needed for chest pain. 03/06/17  Yes Theodoro Grist, MD  ezetimibe (ZETIA) 10 MG tablet Take 1 tablet (10 mg total) by mouth daily. 04/17/17 07/16/17  Rogelia Mire, NP    Review of Systems    She denies chest pain, palpitations, dyspnea, pnd, orthopnea, n, v, dizziness, syncope, edema, weight gain, or early satiety.  All other systems reviewed and are otherwise negative except as noted above.  Physical Exam    VS:  BP (!) 142/66 (BP Location: Right Arm, Patient Position: Sitting, Cuff Size: Normal)   Pulse 68   Ht 5' (1.524 m)   Wt 85 lb 12 oz (38.9 kg)   BMI 16.75 kg/m  , BMI Body mass index is 16.75 kg/m. GEN: Well nourished, well developed, in no acute distress.  HEENT: normal.  Neck: Supple, no JVD, Right greater than left carotid bruits noted. No masses. Cardiac: RRR, no murmurs, rubs, or gallops. No clubbing, cyanosis, edema.  Radials/DP/PT 2+ and equal bilaterally.  Respiratory:  Respirations regular and unlabored, Scattered rhonchi bilaterally. GI: Soft, nontender, nondistended, BS + x 4. MS: no deformity or atrophy. Skin: warm and dry, no rash. Neuro:  Strength and sensation are intact. Psych: Normal affect.  Accessory Clinical Findings    Lab Results  Component Value Date   CREATININE 0.88 04/11/2017   BUN 18 04/11/2017   NA 132 (L) 04/11/2017   K 5.0 04/11/2017   CL 94 (L) 04/11/2017   CO2 25 04/11/2017     Assessment & Plan    1.  Coronary artery disease: Status post non-STEMI in August with catheterization revealing a total occlusion of the right coronary artery with left-to-right collaterals. Blood pressure is better since her last visit though is still not quite at goal. In that  setting, she has not been having any chest discomfort. She remains on Plavix, beta blocker, ARB therapy. We did address utilization of statins and she is not interested after having thought about it. She is willing to accept a prescription for Zetia however.  2. Essential hypertension: Blood pressure is generally high first thing in the morning but comes down into the 1:30 to 140 range during the day. She is 142/66 today. I will increase her losartan to 100 mg daily and follow-up basic metabolic panel in 1 week. She otherwise remains on amlodipine and metoprolol.  3. Hyperlipidemia: Previous LDL was 128 in May 2017. She is not willing to consider taking a statin. She will try Zetia if she can afford it. We'll prescribe this today.  4. Tobacco abuse: She has smoked a few cigarettes over the past month. She has not purchased any cigarettes and is now out of them. She is not planning on purchasing anymore. She knows  that she needs to quit and cessation has been advised.  5. Peripheral arterial disease/abdominal aortic aneurysm: She has follow-up with Dr. Lucky Cowboy in November.  6. Bilateral carotid bruits: Follow-up carotid ultrasound. Continue Plavix and initiate Zetia therapy.  7. Disposition: Follow-up carotid ultrasound. Follow-up basic metabolic panel in 1 week. Patient will check blood pressures at home and call us in a week. Follow up in clinic in 3 months or sooner if necessary.  Murray Hodgkins, NP 04/17/2017, 2:16 PM

## 2017-04-17 NOTE — Patient Instructions (Addendum)
Medication Instructions:  Please START Zetia 10 mg once daily Please INCREASE losartan to 100 mg once daily   Please call the office with BP readings    Labwork: BMET   Testing/Procedures: Your physician has requested that you have a carotid duplex. This test is an ultrasound of the carotid arteries in your neck. It looks at blood flow through these arteries that supply the brain with blood. Allow one hour for this exam. There are no restrictions or special instructions.  Follow-Up: 3 months w/ Dr. Fletcher Anon  If you need a refill on your cardiac medications before your next appointment, please call your pharmacy.

## 2017-04-20 ENCOUNTER — Other Ambulatory Visit: Payer: Self-pay | Admitting: Nurse Practitioner

## 2017-04-20 DIAGNOSIS — R0989 Other specified symptoms and signs involving the circulatory and respiratory systems: Secondary | ICD-10-CM

## 2017-04-27 ENCOUNTER — Other Ambulatory Visit (INDEPENDENT_AMBULATORY_CARE_PROVIDER_SITE_OTHER): Payer: Medicare Other | Admitting: *Deleted

## 2017-04-27 ENCOUNTER — Ambulatory Visit (INDEPENDENT_AMBULATORY_CARE_PROVIDER_SITE_OTHER): Payer: Medicare Other

## 2017-04-27 DIAGNOSIS — I10 Essential (primary) hypertension: Secondary | ICD-10-CM

## 2017-04-27 DIAGNOSIS — Z23 Encounter for immunization: Secondary | ICD-10-CM

## 2017-04-27 DIAGNOSIS — R0989 Other specified symptoms and signs involving the circulatory and respiratory systems: Secondary | ICD-10-CM | POA: Diagnosis not present

## 2017-04-27 DIAGNOSIS — Z720 Tobacco use: Secondary | ICD-10-CM

## 2017-04-27 DIAGNOSIS — E782 Mixed hyperlipidemia: Secondary | ICD-10-CM

## 2017-04-28 LAB — VAS US CAROTID
LCCADDIAS: 25 cm/s
LEFT ECA DIAS: -12 cm/s
LEFT VERTEBRAL DIAS: 16 cm/s
LICADSYS: 114 cm/s
Left CCA dist sys: 122 cm/s
Left CCA prox dias: 15 cm/s
Left CCA prox sys: 65 cm/s
Left ICA dist dias: 25 cm/s
Left ICA prox dias: 56 cm/s
Left ICA prox sys: 213 cm/s
RCCADSYS: -184 cm/s
RCCAPDIAS: -15 cm/s
RIGHT ECA DIAS: -13 cm/s
RIGHT VERTEBRAL DIAS: 15 cm/s
Right CCA prox sys: -68 cm/s

## 2017-04-29 LAB — BASIC METABOLIC PANEL
BUN / CREAT RATIO: 11 — AB (ref 12–28)
BUN: 10 mg/dL (ref 8–27)
CALCIUM: 9.4 mg/dL (ref 8.7–10.3)
CHLORIDE: 92 mmol/L — AB (ref 96–106)
CO2: 18 mmol/L — AB (ref 20–29)
Creatinine, Ser: 0.88 mg/dL (ref 0.57–1.00)
GFR calc Af Amer: 74 mL/min/{1.73_m2} (ref >59)
GFR calc non Af Amer: 64 mL/min/{1.73_m2} (ref >59)
Glucose: 76 mg/dL (ref 65–99)
Potassium: 6.2 mmol/L — ABNORMAL HIGH (ref 3.5–5.2)
SODIUM: 128 mmol/L — AB (ref 134–144)

## 2017-04-30 ENCOUNTER — Other Ambulatory Visit
Admission: EM | Admit: 2017-04-30 | Discharge: 2017-04-30 | Disposition: A | Discharge status: Home / Self Care | Payer: Medicare Other | Attending: Family Medicine | Admitting: Family Medicine

## 2017-04-30 ENCOUNTER — Other Ambulatory Visit: Payer: Self-pay

## 2017-04-30 DIAGNOSIS — E875 Hyperkalemia: Secondary | ICD-10-CM | POA: Insufficient documentation

## 2017-04-30 DIAGNOSIS — I6523 Occlusion and stenosis of bilateral carotid arteries: Secondary | ICD-10-CM

## 2017-04-30 LAB — BASIC METABOLIC PANEL
Anion gap: 8 (ref 5–15)
BUN: 19 mg/dL (ref 6–20)
CHLORIDE: 94 mmol/L — AB (ref 101–111)
CO2: 27 mmol/L (ref 22–32)
CREATININE: 0.82 mg/dL (ref 0.44–1.00)
Calcium: 9.6 mg/dL (ref 8.9–10.3)
GFR calc Af Amer: >60 mL/min (ref >60)
GFR calc non Af Amer: >60 mL/min (ref >60)
GLUCOSE: 94 mg/dL (ref 65–99)
Potassium: 4.6 mmol/L (ref 3.5–5.1)
Sodium: 129 mmol/L — ABNORMAL LOW (ref 135–145)

## 2017-05-18 ENCOUNTER — Ambulatory Visit (INDEPENDENT_AMBULATORY_CARE_PROVIDER_SITE_OTHER): Payer: Medicare Other

## 2017-05-18 ENCOUNTER — Encounter (INDEPENDENT_AMBULATORY_CARE_PROVIDER_SITE_OTHER): Payer: Self-pay | Admitting: Vascular Surgery

## 2017-05-18 ENCOUNTER — Ambulatory Visit (INDEPENDENT_AMBULATORY_CARE_PROVIDER_SITE_OTHER): Payer: Medicare Other | Admitting: Vascular Surgery

## 2017-05-18 VITALS — BP 159/72 | HR 66 | Resp 16 | Ht 60.0 in | Wt 85.4 lb

## 2017-05-18 DIAGNOSIS — I739 Peripheral vascular disease, unspecified: Secondary | ICD-10-CM

## 2017-05-18 DIAGNOSIS — I714 Abdominal aortic aneurysm, without rupture, unspecified: Secondary | ICD-10-CM

## 2017-05-18 DIAGNOSIS — I6523 Occlusion and stenosis of bilateral carotid arteries: Secondary | ICD-10-CM | POA: Diagnosis not present

## 2017-05-18 DIAGNOSIS — I6529 Occlusion and stenosis of unspecified carotid artery: Secondary | ICD-10-CM | POA: Insufficient documentation

## 2017-05-18 DIAGNOSIS — I200 Unstable angina: Secondary | ICD-10-CM

## 2017-05-18 NOTE — Progress Notes (Signed)
Subjective:    Patient ID: Christina Hester, female    DOB: 1941-07-05, 76 y.o.   MRN: [id number] Chief Complaint  Patient presents with  . AAA    55yr follow up   Patient presents for a yearly follow-up in regards to multiple vascular issues. The patient today presents without complaint. The patient underwent an abdominal aorta duplex exam which was notable for aneurysmal dilatation of the distal abdominal aorta with a maximum diameter of 3.0cm. On 05/17/2016, the patient's abdominal aorta measured 3.02 cm x 3.02 cm. The patient denies any abdominal pain, back pain or thrombosis to the lower extremity. The patient underwent a bilateral ABI which was notable for normal arterial perfusion to the right lower extremity. Mild left lower extremity arterial occlusive disease. Compared to the previous exam on 05/17/2016 there has been no significant change. On 04/25/2017 the patient was diagnosed with carotid artery stenosis by Dr. Fletcher Anon. Carotid duplex results are: Right Carotid: There is evidence in the right ICA of a 60-79% stenosis. Non-hemodynamically significant plaque <50% noted in the CCA.  Left Carotid: There is evidence in the left ICA of a 40-59% stenosis. Non-hemodynamically significant plaque noted in the CCA.  Vertebrals: Both vertebral arteries were patent with antegrade flow. Subclavians: Normal flow hemodynamics were seen in bilateral subclavian arteries. Patient denies any amaurosis fugax or neurological symptoms. Patient denies any fever, nausea vomiting.   Review of Systems  Constitutional: Negative.   HENT: Negative.   Eyes: Negative.   Respiratory: Negative.   Cardiovascular: Negative.   Gastrointestinal: Negative.   Endocrine: Negative.   Genitourinary: Negative.   Musculoskeletal: Negative.   Skin: Negative.   Allergic/Immunologic: Negative.   Neurological: Negative.   Hematological: Negative.   Psychiatric/Behavioral: Negative.       Objective:   Physical Exam    Constitutional: She is oriented to person, place, and time. She appears well-developed and well-nourished. No distress.  HENT:  Head: Normocephalic and atraumatic.  Eyes: Pupils are equal, round, and reactive to light. Conjunctivae are normal.  Neck: Normal range of motion.  Left carotid bruit noted on exam  Cardiovascular: Normal rate, regular rhythm, normal heart sounds and intact distal pulses.   Pulses:      Radial pulses are 2+ on the right side, and 2+ on the left side.       Dorsalis pedis pulses are 2+ on the right side, and 2+ on the left side.       Posterior tibial pulses are 2+ on the right side, and 2+ on the left side.  Pulmonary/Chest: Effort normal and breath sounds normal.  Abdominal: Soft. Bowel sounds are normal.  Musculoskeletal: Normal range of motion. She exhibits no edema.  Neurological: She is alert and oriented to person, place, and time.  Skin: Skin is warm and dry. She is not diaphoretic.  Psychiatric: She has a normal mood and affect. Her behavior is normal. Judgment and thought content normal.  Vitals reviewed.  BP (!) 159/72 (BP Location: Right Arm)   Pulse 66   Resp 16   Ht 5' (1.524 m)   Wt 85 lb 6.4 oz (38.7 kg)   BMI 16.68 kg/m   Past Medical History:  Diagnosis Date  . AAA (abdominal aortic aneurysm) ([place])    a. 05/2016 Abd U/S: 3.02 x 3.02 AAA.  Marland Kitchen CAD (coronary artery disease)    a. 02/2017 NSTEMI/Cath: LM nl, LAD mild dzs, D2 min irregs, LCX mild dzs, OM2 50ost, RCA 95p, 185m, L-->R collats,  EF 55-65%.  Marland Kitchen COPD (chronic obstructive pulmonary disease) ([place])   . Diastolic dysfunction    a. 02/2017 Echo: EF 55-60%, gr1 DD, ? inf HK, mild MR.  Marland Kitchen Hyperlipidemia   . Hypertension   . PAD (peripheral artery disease) ([place])    a. 05/2016 ABI's: R 1.22, L 1.11.  . Tobacco abuse    Social History   Social History  . Marital status: Divorced    Spouse name: N/A  . Number of children: N/A  . Years of education: N/A   Occupational History  . Not  on file.   Social History Main Topics  . Smoking status: Light Tobacco Smoker    Packs/day: 0.25  . Smokeless tobacco: Never Used     Comment: off and on; not every day.  . Alcohol use No  . Drug use: No  . Sexual activity: Not on file   Other Topics Concern  . Not on file   Social History Narrative  . No narrative on file   Past Surgical History:  Procedure Laterality Date  . ABDOMINAL HYSTERECTOMY    . BREAST EXCISIONAL BIOPSY Left 20+ YRS AGO   NEG  . LEFT HEART CATH Bilateral 03/05/2017   Procedure: Left Heart Cath with poss PCI;  Surgeon: Wellington Hampshire, MD;  Location: [place] [facility];  Service: Cardiovascular;  Laterality: Bilateral;   Family History  Problem Relation Age of Onset  . Breast cancer Mother   . Breast cancer Maternal Aunt   . Breast cancer Maternal Aunt   . Breast cancer Maternal Aunt    Allergies  Allergen Reactions  . Aspirin Tinitus  . Prednisone Other (See Comments)    Makes violent  . Sulfa Antibiotics Nausea And Vomiting  . Symbicort [Budesonide-Formoterol Fumarate] Other (See Comments)    Patient felt "out of her mind" and angry.      Assessment & Plan:  Patient presents for a yearly follow-up in regards to multiple vascular issues. The patient today presents without complaint. The patient underwent an abdominal aorta duplex exam which was notable for aneurysmal dilatation of the distal abdominal aorta with a maximum diameter of 3.0cm. On 05/17/2016, the patient's abdominal aorta measured 3.02 cm x 3.02 cm. The patient denies any abdominal pain, back pain or thrombosis to the lower extremity. The patient underwent a bilateral ABI which was notable for normal arterial perfusion to the right lower extremity. Mild left lower extremity arterial occlusive disease. Compared to the previous exam on 05/17/2016 there has been no significant change. On 04/25/2017 the patient was diagnosed with carotid artery stenosis by Dr. Fletcher Anon. Carotid duplex  results are: Right Carotid: There is evidence in the right ICA of a 60-79% stenosis. Non-hemodynamically significant plaque <50% noted in the CCA.  Left Carotid: There is evidence in the left ICA of a 40-59% stenosis. Non-hemodynamically significant plaque noted in the CCA.  Vertebrals: Both vertebral arteries were patent with antegrade flow. Subclavians: Normal flow hemodynamics were seen in bilateral subclavian arteries. Patient denies any amaurosis fugax or neurological symptoms. Patient denies any fever, nausea vomiting.  1. AAA (abdominal aortic aneurysm) without rupture ([place]) - Stable Patient presents today with a AAA measuring 3.0cm. This is essentially a normal sized aorta. Patient is asymptomatic Physical exam is unremarkable We'll plan on repeat in one year if stable I don't feel it is necessary to continue to follow.  - VAS [place] AAA DUPLEX; Future  2. PVD (peripheral vascular disease) ([facility]) - Stable Right lower extremity  without disease. Mild left lower extremity disease Patient presents today without complaint Physical exam is unremarkable With no bring patient back in one year for surveillance ABI.  - VAS [place] ABI WITH/WO TBI; Future  3. Bilateral carotid artery stenosis - New Patient with new diagnosis of carotid stenosis by Dr. Fletcher Anon I went over these results with her. Right Carotid: There is evidence in the right ICA of a 60-79% stenosis. Non-hemodynamically significant plaque <50% noted in the CCA.  Left Carotid: There is evidence in the left ICA of a 40-59% stenosis. Non-hemodynamically significant plaque noted in the CCA.  Vertebrals: Both vertebral arteries were patent with antegrade flow. Subclavians: Normal flow hemodynamics were seen in bilateral subclavian arteries. She is asymptomatic at this time Recommend a 6 month follow-up The patient would like to follow up with Dr. Fletcher Anon but knows that we're here if she needs [place].  Current Outpatient Prescriptions on File  Prior to Visit  Medication Sig Dispense Refill  . amLODipine (NORVASC) 5 MG tablet Take 1 tablet (5 mg total) by mouth daily. 30 tablet 3  . buPROPion (WELLBUTRIN) 100 MG tablet Take 100 mg by mouth daily.    . clopidogrel (PLAVIX) 75 MG tablet Take 1 tablet (75 mg total) by mouth daily. 90 tablet 3  . guaiFENesin (MUCINEX) 600 MG 12 hr tablet Take 1 tablet (600 mg total) by mouth 2 (two) times daily. 20 tablet 0  . ipratropium-albuterol (DUONEB) 0.5-2.5 (3) MG/3ML SOLN Take 3 mLs by nebulization every 4 (four) hours. (Patient taking differently: Take 3 mLs by nebulization every 4 (four) hours as needed. ) 360 mL 3  . losartan (COZAAR) 100 MG tablet Take 1 tablet (100 mg total) by mouth daily. 90 tablet 3  . metoprolol tartrate (LOPRESSOR) 25 MG tablet Take 1 tablet (25 mg total) by mouth 2 (two) times daily. 60 tablet 3  . nitroGLYCERIN (NITROSTAT) 0.4 MG SL tablet Place 1 tablet (0.4 mg total) under the tongue every 5 (five) minutes as needed for chest pain. 20 tablet 12   No current facility-administered medications on file prior to visit.     There are no Patient Instructions on file for this visit. No Follow-up on file.   Danika Kluender A Debie Ashline, PA-C

## 2017-07-14 ENCOUNTER — Inpatient Hospital Stay: Payer: Medicare Other

## 2017-07-14 ENCOUNTER — Inpatient Hospital Stay
Admission: EM | Admit: 2017-07-14 | Discharge: 2017-07-19 | DRG: 193 | Disposition: A | Discharge status: Home Health | Payer: Medicare Other | Attending: Internal Medicine | Admitting: Internal Medicine

## 2017-07-14 ENCOUNTER — Emergency Department: Payer: Medicare Other

## 2017-07-14 ENCOUNTER — Encounter: Payer: Self-pay | Admitting: Emergency Medicine

## 2017-07-14 DIAGNOSIS — I2510 Atherosclerotic heart disease of native coronary artery without angina pectoris: Secondary | ICD-10-CM | POA: Diagnosis present

## 2017-07-14 DIAGNOSIS — J810 Acute pulmonary edema: Secondary | ICD-10-CM | POA: Diagnosis not present

## 2017-07-14 DIAGNOSIS — R0603 Acute respiratory distress: Secondary | ICD-10-CM | POA: Diagnosis not present

## 2017-07-14 DIAGNOSIS — I10 Essential (primary) hypertension: Secondary | ICD-10-CM | POA: Diagnosis present

## 2017-07-14 DIAGNOSIS — J441 Chronic obstructive pulmonary disease with (acute) exacerbation: Secondary | ICD-10-CM

## 2017-07-14 DIAGNOSIS — F17210 Nicotine dependence, cigarettes, uncomplicated: Secondary | ICD-10-CM | POA: Diagnosis present

## 2017-07-14 DIAGNOSIS — J440 Chronic obstructive pulmonary disease with acute lower respiratory infection: Secondary | ICD-10-CM | POA: Diagnosis present

## 2017-07-14 DIAGNOSIS — R0602 Shortness of breath: Secondary | ICD-10-CM | POA: Diagnosis not present

## 2017-07-14 DIAGNOSIS — E871 Hypo-osmolality and hyponatremia: Secondary | ICD-10-CM | POA: Diagnosis not present

## 2017-07-14 DIAGNOSIS — Z90710 Acquired absence of both cervix and uterus: Secondary | ICD-10-CM | POA: Diagnosis not present

## 2017-07-14 DIAGNOSIS — J189 Pneumonia, unspecified organism: Principal | ICD-10-CM | POA: Diagnosis present

## 2017-07-14 DIAGNOSIS — I714 Abdominal aortic aneurysm, without rupture: Secondary | ICD-10-CM | POA: Diagnosis present

## 2017-07-14 DIAGNOSIS — I248 Other forms of acute ischemic heart disease: Secondary | ICD-10-CM | POA: Diagnosis not present

## 2017-07-14 DIAGNOSIS — I739 Peripheral vascular disease, unspecified: Secondary | ICD-10-CM | POA: Diagnosis present

## 2017-07-14 DIAGNOSIS — E785 Hyperlipidemia, unspecified: Secondary | ICD-10-CM | POA: Diagnosis not present

## 2017-07-14 DIAGNOSIS — J9622 Acute and chronic respiratory failure with hypercapnia: Secondary | ICD-10-CM | POA: Diagnosis present

## 2017-07-14 DIAGNOSIS — J9621 Acute and chronic respiratory failure with hypoxia: Secondary | ICD-10-CM | POA: Diagnosis present

## 2017-07-14 DIAGNOSIS — I25118 Atherosclerotic heart disease of native coronary artery with other forms of angina pectoris: Secondary | ICD-10-CM | POA: Diagnosis not present

## 2017-07-14 DIAGNOSIS — Z7902 Long term (current) use of antithrombotics/antiplatelets: Secondary | ICD-10-CM | POA: Diagnosis not present

## 2017-07-14 DIAGNOSIS — J9601 Acute respiratory failure with hypoxia: Secondary | ICD-10-CM | POA: Diagnosis not present

## 2017-07-14 DIAGNOSIS — R748 Abnormal levels of other serum enzymes: Secondary | ICD-10-CM | POA: Diagnosis not present

## 2017-07-14 DIAGNOSIS — I252 Old myocardial infarction: Secondary | ICD-10-CM

## 2017-07-14 DIAGNOSIS — Z79899 Other long term (current) drug therapy: Secondary | ICD-10-CM | POA: Diagnosis not present

## 2017-07-14 DIAGNOSIS — F419 Anxiety disorder, unspecified: Secondary | ICD-10-CM | POA: Diagnosis present

## 2017-07-14 DIAGNOSIS — R0600 Dyspnea, unspecified: Secondary | ICD-10-CM | POA: Diagnosis not present

## 2017-07-14 DIAGNOSIS — Z720 Tobacco use: Secondary | ICD-10-CM | POA: Diagnosis not present

## 2017-07-14 LAB — CBC WITH DIFFERENTIAL/PLATELET
Basophils Absolute: 0 10*3/uL (ref 0–0.1)
Basophils Relative: 0 %
EOS PCT: 0 %
Eosinophils Absolute: 0 10*3/uL (ref 0–0.7)
HEMATOCRIT: 33 % — AB (ref 35.0–47.0)
Hemoglobin: 11.6 g/dL — ABNORMAL LOW (ref 12.0–16.0)
LYMPHS ABS: 1.5 10*3/uL (ref 1.0–3.6)
LYMPHS PCT: 12 %
MCH: 31.3 pg (ref 26.0–34.0)
MCHC: 35.1 g/dL (ref 32.0–36.0)
MCV: 89.1 fL (ref 80.0–100.0)
MONO ABS: 0.9 10*3/uL (ref 0.2–0.9)
Monocytes Relative: 7 %
NEUTROS ABS: 10 10*3/uL — AB (ref 1.4–6.5)
Neutrophils Relative %: 81 %
PLATELETS: 239 10*3/uL (ref 150–440)
RBC: 3.71 MIL/uL — ABNORMAL LOW (ref 3.80–5.20)
RDW: 12.7 % (ref 11.5–14.5)
WBC: 12.5 10*3/uL — ABNORMAL HIGH (ref 3.6–11.0)

## 2017-07-14 LAB — BLOOD GAS, VENOUS
ACID-BASE EXCESS: 0.3 mmol/L (ref 0.0–2.0)
BICARBONATE: 24.6 mmol/L (ref 20.0–28.0)
O2 SAT: 98.5 %
PCO2 VEN: 38 mmHg — AB (ref 44.0–60.0)
PO2 VEN: 113 mmHg — AB (ref 32.0–45.0)
Patient temperature: 37
pH, Ven: 7.42 (ref 7.250–7.430)

## 2017-07-14 LAB — COMPREHENSIVE METABOLIC PANEL
ALT: 16 U/L (ref 14–54)
AST: 30 U/L (ref 15–41)
Albumin: 3.6 g/dL (ref 3.5–5.0)
Alkaline Phosphatase: 81 U/L (ref 38–126)
Anion gap: 11 (ref 5–15)
BILIRUBIN TOTAL: 0.8 mg/dL (ref 0.3–1.2)
BUN: 10 mg/dL (ref 6–20)
CHLORIDE: 86 mmol/L — AB (ref 101–111)
CO2: 21 mmol/L — ABNORMAL LOW (ref 22–32)
CREATININE: 0.6 mg/dL (ref 0.44–1.00)
Calcium: 8.6 mg/dL — ABNORMAL LOW (ref 8.9–10.3)
Glucose, Bld: 129 mg/dL — ABNORMAL HIGH (ref 65–99)
POTASSIUM: 3.9 mmol/L (ref 3.5–5.1)
Sodium: 118 mmol/L — CL (ref 135–145)
TOTAL PROTEIN: 7 g/dL (ref 6.5–8.1)

## 2017-07-14 LAB — BASIC METABOLIC PANEL
Anion gap: 9 (ref 5–15)
BUN: 9 mg/dL (ref 6–20)
CHLORIDE: 91 mmol/L — AB (ref 101–111)
CO2: 21 mmol/L — ABNORMAL LOW (ref 22–32)
CREATININE: 0.67 mg/dL (ref 0.44–1.00)
Calcium: 8.4 mg/dL — ABNORMAL LOW (ref 8.9–10.3)
GFR calc Af Amer: >60 mL/min (ref >60)
GFR calc non Af Amer: >60 mL/min (ref >60)
GLUCOSE: 144 mg/dL — AB (ref 65–99)
POTASSIUM: 3.6 mmol/L (ref 3.5–5.1)
SODIUM: 121 mmol/L — AB (ref 135–145)

## 2017-07-14 LAB — CREATININE, URINE, RANDOM: CREATININE, URINE: 28 mg/dL

## 2017-07-14 LAB — BLOOD GAS, ARTERIAL
ACID-BASE DEFICIT: 3.4 mmol/L — AB (ref 0.0–2.0)
Acid-base deficit: 3.3 mmol/L — ABNORMAL HIGH (ref 0.0–2.0)
BICARBONATE: 25.1 mmol/L (ref 20.0–28.0)
Bicarbonate: 23.9 mmol/L (ref 20.0–28.0)
DELIVERY SYSTEMS: POSITIVE
Expiratory PAP: 6
FIO2: 0.32
FIO2: 0.4
Inspiratory PAP: 12
O2 SAT: 98.4 %
O2 Saturation: 90.5 %
PATIENT TEMPERATURE: 37
PATIENT TEMPERATURE: 37
PCO2 ART: 52 mmHg — AB (ref 32.0–48.0)
PH ART: 7.23 — AB (ref 7.350–7.450)
PH ART: 7.27 — AB (ref 7.350–7.450)
PO2 ART: 126 mmHg — AB (ref 83.0–108.0)
pCO2 arterial: 60 mmHg — ABNORMAL HIGH (ref 32.0–48.0)
pO2, Arterial: 71 mmHg — ABNORMAL LOW (ref 83.0–108.0)

## 2017-07-14 LAB — PROTEIN / CREATININE RATIO, URINE
Creatinine, Urine: 27 mg/dL
PROTEIN CREATININE RATIO: 1.15 mg/mg{creat} — AB (ref 0.00–0.15)
Total Protein, Urine: 31 mg/dL

## 2017-07-14 LAB — TROPONIN I
TROPONIN I: 0.51 ng/mL — AB (ref <0.03)
TROPONIN I: 0.68 ng/mL — AB (ref <0.03)
TROPONIN I: 1.07 ng/mL — AB (ref <0.03)
Troponin I: 0.18 ng/mL (ref <0.03)
Troponin I: 1.13 ng/mL (ref <0.03)

## 2017-07-14 LAB — CHLORIDE, URINE, RANDOM: Chloride Urine: 54 mmol/L

## 2017-07-14 LAB — TSH: TSH: 1.504 u[IU]/mL (ref 0.350–4.500)

## 2017-07-14 LAB — INFLUENZA PANEL BY PCR (TYPE A & B)
Influenza A By PCR: NEGATIVE
Influenza B By PCR: NEGATIVE

## 2017-07-14 LAB — BRAIN NATRIURETIC PEPTIDE: B NATRIURETIC PEPTIDE 5: 195 pg/mL — AB (ref 0.0–100.0)

## 2017-07-14 LAB — OSMOLALITY, URINE: OSMOLALITY UR: 330 mosm/kg (ref 300–900)

## 2017-07-14 LAB — SODIUM, URINE, RANDOM: Sodium, Ur: 51 mmol/L

## 2017-07-14 LAB — MRSA PCR SCREENING: MRSA BY PCR: NEGATIVE

## 2017-07-14 MED ORDER — ASPIRIN 81 MG PO CHEW: 324.0000 mg | CHEWABLE_TABLET | Freq: Once | ORAL | Status: DC

## 2017-07-14 MED ORDER — GI COCKTAIL ~~LOC~~
ORAL | Status: AC
  Administered 2017-07-14: 30 mL via ORAL
  Filled 2017-07-14: qty 30

## 2017-07-14 MED ORDER — GI COCKTAIL ~~LOC~~
30.0000 mL | Freq: Once | ORAL | Status: AC
  Administered 2017-07-14: 30 mL via ORAL

## 2017-07-14 MED ORDER — MORPHINE SULFATE (PF) 2 MG/ML IV SOLN
INTRAVENOUS | Status: AC
  Administered 2017-07-14: 0.5 mg via INTRAVENOUS
  Filled 2017-07-14: qty 1

## 2017-07-14 MED ORDER — IPRATROPIUM-ALBUTEROL 0.5-2.5 (3) MG/3ML IN SOLN
3.0000 mL | Freq: Once | RESPIRATORY_TRACT | Status: AC
  Administered 2017-07-14: 3 mL via RESPIRATORY_TRACT
  Filled 2017-07-14: qty 3

## 2017-07-14 MED ORDER — ORAL CARE MOUTH RINSE
15.0000 mL | Freq: Two times a day (BID) | OROMUCOSAL | Status: DC
  Administered 2017-07-16 – 2017-07-18 (×5): 15 mL via OROMUCOSAL

## 2017-07-14 MED ORDER — CEFTRIAXONE SODIUM 1 G IJ SOLR
1.0000 g | INTRAMUSCULAR | Status: DC
Start: 2017-07-14 — End: 2017-07-16
  Administered 2017-07-14 – 2017-07-15 (×2): 1 g via INTRAVENOUS
  Filled 2017-07-14 (×4): qty 10

## 2017-07-14 MED ORDER — AZITHROMYCIN 250 MG PO TABS: 250.0000 mg | ORAL_TABLET | Freq: Every day | ORAL | Status: DC

## 2017-07-14 MED ORDER — FUROSEMIDE 20 MG PO TABS
20.0000 mg | ORAL_TABLET | Freq: Once | ORAL | Status: AC
  Administered 2017-07-14: 20 mg via ORAL

## 2017-07-14 MED ORDER — BENZONATATE 100 MG PO CAPS
100.0000 mg | ORAL_CAPSULE | Freq: Three times a day (TID) | ORAL | Status: DC
  Administered 2017-07-14 – 2017-07-19 (×12): 100 mg via ORAL
  Filled 2017-07-14 (×13): qty 1

## 2017-07-14 MED ORDER — ENOXAPARIN SODIUM 40 MG/0.4ML ~~LOC~~ SOLN
40.0000 mg | SUBCUTANEOUS | Status: DC
  Filled 2017-07-14: qty 0.4

## 2017-07-14 MED ORDER — METHYLPREDNISOLONE SODIUM SUCC 40 MG IJ SOLR: 40.0000 mg | Freq: Every day | INTRAMUSCULAR | Status: DC

## 2017-07-14 MED ORDER — BUPROPION HCL 100 MG PO TABS
100.0000 mg | ORAL_TABLET | Freq: Every day | ORAL | Status: DC
  Administered 2017-07-14 – 2017-07-19 (×6): 100 mg via ORAL
  Filled 2017-07-14 (×6): qty 1

## 2017-07-14 MED ORDER — ACETAMINOPHEN 325 MG PO TABS
650.0000 mg | ORAL_TABLET | Freq: Four times a day (QID) | ORAL | Status: DC | PRN
  Filled 2017-07-14: qty 2

## 2017-07-14 MED ORDER — NITROGLYCERIN 0.4 MG SL SUBL: 0.4000 mg | SUBLINGUAL_TABLET | SUBLINGUAL | Status: DC | PRN

## 2017-07-14 MED ORDER — ENOXAPARIN SODIUM 30 MG/0.3ML ~~LOC~~ SOLN
30.0000 mg | SUBCUTANEOUS | Status: DC
  Filled 2017-07-14 (×5): qty 0.3

## 2017-07-14 MED ORDER — SODIUM CHLORIDE 0.9 % IV SOLN
INTRAVENOUS | Status: DC
  Administered 2017-07-14 (×2): via INTRAVENOUS

## 2017-07-14 MED ORDER — IOPAMIDOL (ISOVUE-370) INJECTION 76%
50.0000 mL | Freq: Once | INTRAVENOUS | Status: AC | PRN
  Administered 2017-07-14: 13:00:00 via INTRAVENOUS

## 2017-07-14 MED ORDER — MELATONIN 5 MG PO TABS
5.0000 mg | ORAL_TABLET | Freq: Every evening | ORAL | Status: DC | PRN
  Administered 2017-07-15 – 2017-07-18 (×2): 5 mg via ORAL
  Filled 2017-07-14 (×5): qty 1

## 2017-07-14 MED ORDER — DOCUSATE SODIUM 100 MG PO CAPS
100.0000 mg | ORAL_CAPSULE | Freq: Two times a day (BID) | ORAL | Status: DC
  Administered 2017-07-14 – 2017-07-19 (×10): 100 mg via ORAL
  Filled 2017-07-14 (×12): qty 1

## 2017-07-14 MED ORDER — AMLODIPINE BESYLATE 5 MG PO TABS
5.0000 mg | ORAL_TABLET | Freq: Every day | ORAL | Status: DC
  Administered 2017-07-14 – 2017-07-19 (×6): 5 mg via ORAL
  Filled 2017-07-14 (×6): qty 1

## 2017-07-14 MED ORDER — CLOPIDOGREL BISULFATE 75 MG PO TABS
75.0000 mg | ORAL_TABLET | Freq: Every day | ORAL | Status: DC
  Administered 2017-07-14 – 2017-07-18 (×5): 75 mg via ORAL
  Filled 2017-07-14 (×7): qty 1

## 2017-07-14 MED ORDER — IPRATROPIUM-ALBUTEROL 0.5-2.5 (3) MG/3ML IN SOLN: 3.0000 mL | Freq: Four times a day (QID) | RESPIRATORY_TRACT | Status: DC

## 2017-07-14 MED ORDER — AZITHROMYCIN 250 MG PO TABS: 500.0000 mg | ORAL_TABLET | Freq: Every day | ORAL | Status: DC

## 2017-07-14 MED ORDER — MORPHINE SULFATE (PF) 2 MG/ML IV SOLN
0.5000 mg | INTRAVENOUS | Status: DC | PRN
  Filled 2017-07-14: qty 1

## 2017-07-14 MED ORDER — MORPHINE SULFATE (PF) 2 MG/ML IV SOLN
2.0000 mg | INTRAVENOUS | Status: DC | PRN
  Administered 2017-07-14 – 2017-07-15 (×8): 2 mg via INTRAVENOUS
  Filled 2017-07-14 (×7): qty 1

## 2017-07-14 MED ORDER — ACETAMINOPHEN 650 MG RE SUPP: 650.0000 mg | Freq: Four times a day (QID) | RECTAL | Status: DC | PRN

## 2017-07-14 MED ORDER — DEXMEDETOMIDINE HCL IN NACL 400 MCG/100ML IV SOLN
0.4000 ug/kg/h | INTRAVENOUS | Status: DC
  Administered 2017-07-14: 0.3 ug/kg/h via INTRAVENOUS
  Administered 2017-07-15: 0.6 ug/kg/h via INTRAVENOUS
  Filled 2017-07-14 (×2): qty 100

## 2017-07-14 MED ORDER — METOPROLOL TARTRATE 25 MG PO TABS
25.0000 mg | ORAL_TABLET | Freq: Two times a day (BID) | ORAL | Status: DC
  Administered 2017-07-14 – 2017-07-19 (×10): 25 mg via ORAL
  Filled 2017-07-14 (×11): qty 1

## 2017-07-14 MED ORDER — BOOST / RESOURCE BREEZE PO LIQD CUSTOM
1.0000 | Freq: Every day | ORAL | Status: DC
  Administered 2017-07-16: 1 via ORAL

## 2017-07-14 MED ORDER — SODIUM CHLORIDE 0.9 % IV BOLUS (SEPSIS)
1000.0000 mL | Freq: Once | INTRAVENOUS | Status: AC
  Administered 2017-07-14: 1000 mL via INTRAVENOUS

## 2017-07-14 MED ORDER — GUAIFENESIN-DM 100-10 MG/5ML PO SYRP
5.0000 mL | ORAL_SOLUTION | ORAL | Status: DC | PRN
  Administered 2017-07-19 (×3): 5 mL via ORAL
  Filled 2017-07-14 (×6): qty 5

## 2017-07-14 MED ORDER — ONDANSETRON HCL 4 MG/2ML IJ SOLN: 4.0000 mg | Freq: Four times a day (QID) | INTRAMUSCULAR | Status: DC | PRN

## 2017-07-14 MED ORDER — DEXTROSE 5 % IV SOLN
500.0000 mg | INTRAVENOUS | Status: DC
  Administered 2017-07-14 – 2017-07-15 (×2): 500 mg via INTRAVENOUS
  Filled 2017-07-14 (×4): qty 500

## 2017-07-14 MED ORDER — BUDESONIDE 0.25 MG/2ML IN SUSP
0.2500 mg | Freq: Two times a day (BID) | RESPIRATORY_TRACT | Status: DC
  Administered 2017-07-14 – 2017-07-15 (×2): 0.25 mg via RESPIRATORY_TRACT
  Filled 2017-07-14: qty 2

## 2017-07-14 MED ORDER — ONDANSETRON HCL 4 MG PO TABS: 4.0000 mg | ORAL_TABLET | Freq: Four times a day (QID) | ORAL | Status: DC | PRN

## 2017-07-14 MED ORDER — ASPIRIN 81 MG PO CHEW
162.0000 mg | CHEWABLE_TABLET | Freq: Once | ORAL | Status: AC
  Administered 2017-07-14: 162 mg via ORAL
  Filled 2017-07-14: qty 2

## 2017-07-14 MED ORDER — CHLORHEXIDINE GLUCONATE 0.12 % MT SOLN
15.0000 mL | Freq: Two times a day (BID) | OROMUCOSAL | Status: DC
  Administered 2017-07-15 – 2017-07-19 (×9): 15 mL via OROMUCOSAL
  Filled 2017-07-14 (×9): qty 15

## 2017-07-14 MED ORDER — ACETAMINOPHEN 325 MG PO TABS
650.0000 mg | ORAL_TABLET | Freq: Once | ORAL | Status: AC
  Administered 2017-07-14: 650 mg via ORAL
  Filled 2017-07-14: qty 2

## 2017-07-14 MED ORDER — IPRATROPIUM-ALBUTEROL 0.5-2.5 (3) MG/3ML IN SOLN
3.0000 mL | RESPIRATORY_TRACT | Status: DC | PRN
  Administered 2017-07-14 (×3): 3 mL via RESPIRATORY_TRACT
  Filled 2017-07-14 (×5): qty 3

## 2017-07-14 MED ORDER — LOSARTAN POTASSIUM 50 MG PO TABS
100.0000 mg | ORAL_TABLET | Freq: Every day | ORAL | Status: DC
  Administered 2017-07-14 – 2017-07-19 (×6): 100 mg via ORAL
  Filled 2017-07-14 (×6): qty 2

## 2017-07-14 MED ORDER — LORAZEPAM 0.5 MG PO TABS
0.5000 mg | ORAL_TABLET | Freq: Three times a day (TID) | ORAL | Status: DC | PRN
  Administered 2017-07-14 – 2017-07-19 (×8): 0.5 mg via ORAL
  Filled 2017-07-14 (×8): qty 1

## 2017-07-14 MED ORDER — LORAZEPAM 0.5 MG PO TABS
0.5000 mg | ORAL_TABLET | Freq: Two times a day (BID) | ORAL | Status: DC | PRN
  Administered 2017-07-14: 0.5 mg via ORAL
  Filled 2017-07-14 (×2): qty 1

## 2017-07-14 MED ORDER — TIOTROPIUM BROMIDE MONOHYDRATE 18 MCG IN CAPS
18.0000 ug | ORAL_CAPSULE | Freq: Every morning | RESPIRATORY_TRACT | Status: DC
  Administered 2017-07-14 – 2017-07-15 (×2): 18 ug via RESPIRATORY_TRACT
  Filled 2017-07-14: qty 5

## 2017-07-14 MED ORDER — METHYLPREDNISOLONE SODIUM SUCC 125 MG IJ SOLR
60.0000 mg | Freq: Every day | INTRAMUSCULAR | Status: AC
  Administered 2017-07-14: 60 mg via INTRAVENOUS
  Filled 2017-07-14: qty 2

## 2017-07-14 NOTE — Progress Notes (Signed)
ANTIBIOTIC CONSULT NOTE - INITIAL  Pharmacy Consult for ceftriaxone Indication: CAP  Allergies  Allergen Reactions  . Aspirin Tinitus  . Prednisone Other (See Comments)    Makes violent  . Sulfa Antibiotics Nausea And Vomiting  . Symbicort [Budesonide-Formoterol Fumarate] Other (See Comments)    Patient felt "out of her mind" and angry.    Patient Measurements: Height: 5' (152.4 cm) Weight: 86 lb 6.4 oz (39.2 kg) IBW/kg (Calculated) : 45.5 Adjusted Body Weight:   Vital Signs: Temp: 97.7 F (36.5 C) (12/29 0923) Temp Source: Oral (12/29 0923) BP: 164/80 (12/29 0923) Pulse Rate: 101 (12/29 0923) Intake/Output from previous day: No intake/output data recorded. Intake/Output from this shift: No intake/output data recorded.  Labs: Recent Labs    07/14/17 0311 07/14/17 0645  WBC 12.5*  --   HGB 11.6*  --   PLT 239  --   CREATININE 0.60 0.67   Estimated Creatinine Clearance: 37.6 mL/min (by C-G formula based on SCr of 0.67 mg/dL). No results for input(s): VANCOTROUGH, VANCOPEAK, VANCORANDOM, GENTTROUGH, GENTPEAK, GENTRANDOM, TOBRATROUGH, TOBRAPEAK, TOBRARND, AMIKACINPEAK, AMIKACINTROU, AMIKACIN in the last 72 hours.   Microbiology: No results found for this or any previous visit (from the past 720 hour(s)).  Medical History: Past Medical History:  Diagnosis Date  . AAA (abdominal aortic aneurysm) ([place])    a. 05/2016 Abd U/S: 3.02 x 3.02 AAA.  Marland Kitchen CAD (coronary artery disease)    a. 02/2017 NSTEMI/Cath: LM nl, LAD mild dzs, D2 min irregs, LCX mild dzs, OM2 50ost, RCA 95p, 116m, L-->R collats, EF 55-65%.  Marland Kitchen COPD (chronic obstructive pulmonary disease) ([place])   . Diastolic dysfunction    a. 02/2017 Echo: EF 55-60%, gr1 DD, ? inf HK, mild MR.  Marland Kitchen Hyperlipidemia   . Hypertension   . PAD (peripheral artery disease) ([place])    a. 05/2016 ABI's: R 1.22, L 1.11.  . Tobacco abuse     Medications:  Infusions:  . sodium chloride    . azithromycin    . cefTRIAXone (ROCEPHIN)   IV     Assessment: 76 yof cc SOB with PMH AAA, CAD, COPD, diastolic dysfunction, HLD, HTN, PAD, tobacco abuse. Pharmacy consulted to dose ceftriaxone for CAP.  Goal of Therapy:  Resolve infection Prevent ADE  Plan:  Ceftriaxone 1 gm IV Q24H  Laural Benes, Pharm.D., BCPS Clinical Pharmacist 07/14/2017,11:14 AM

## 2017-07-14 NOTE — ED Notes (Addendum)
Spoke with Dr. Benjie Karvonen re: elevated troponin, received verbal order for telemetry, pt does not need new bed assignment per Dr. Benjie Karvonen

## 2017-07-14 NOTE — Progress Notes (Signed)
[facility] [facility] at [address] NAME: Christina Hester    MR#:  [id number]  DATE OF BIRTH:  March 04, 1941  SUBJECTIVE:  Patient here with SOB   REVIEW OF SYSTEMS:    Review of Systems  Constitutional: Negative for fever, chills weight loss HENT: Negative for ear pain, nosebleeds, congestion, facial swelling, rhinorrhea, neck pain, neck stiffness and ear discharge.   Respiratory:++ for cough, shortness of breath, wheezing  Cardiovascular: Negative for chest pain, palpitations and leg swelling.  Gastrointestinal: Negative for heartburn, abdominal pain, vomiting, diarrhea or consitpation Genitourinary: Negative for dysuria, urgency, frequency, hematuria Musculoskeletal: Negative for back pain or joint pain Neurological: Negative for dizziness, seizures, syncope, focal weakness,  numbness and headaches.  Hematological: Does not bruise/bleed easily.  Psychiatric/Behavioral: Negative for hallucinations, confusion, dysphoric mood    Tolerating Diet: yes      DRUG ALLERGIES:   Allergies  Allergen Reactions  . Aspirin Tinitus  . Prednisone Other (See Comments)    Makes violent  . Sulfa Antibiotics Nausea And Vomiting  . Symbicort [Budesonide-Formoterol Fumarate] Other (See Comments)    Patient felt "out of her mind" and angry.    VITALS:  Blood pressure (!) 164/80, pulse (!) 101, temperature 97.7 F (36.5 C), temperature source Oral, resp. rate 20, height 5' (1.524 m), weight 39.2 kg (86 lb 6.4 oz), SpO2 96 %.  PHYSICAL EXAMINATION:  Constitutional: Appears well-developed and well-nourished. No distress. HENT: Normocephalic. Marland Kitchen Oropharynx is clear and moist.  Eyes: Conjunctivae and EOM are normal. PERRLA, no scleral icterus.  Neck: Normal ROM. Neck supple. No JVD. No tracheal deviation. CVS: RRR, S1/S2 +, no murmurs, no gallops, no carotid bruit.  Pulmonary:increased effort with pursed lip breathing and poor air entyr Abdominal: Soft. BS +,  no  distension, tenderness, rebound or guarding.  Musculoskeletal: Normal range of motion. No edema and no tenderness.  Neuro: Alert. CN 2-12 grossly intact. No focal deficits. Skin: Skin is warm and dry. No rash noted. Psychiatric: Normal mood and affect.      LABORATORY PANEL:   CBC Recent Labs  Lab 07/14/17 0311  WBC 12.5*  HGB 11.6*  HCT 33.0*  PLT 239   ------------------------------------------------------------------------------------------------------------------  Chemistries  Recent Labs  Lab 07/14/17 0311 07/14/17 0645  NA 118* 121*  K 3.9 3.6  CL 86* 91*  CO2 21* 21*  GLUCOSE 129* 144*  BUN 10 9  CREATININE 0.60 0.67  CALCIUM 8.6* 8.4*  AST 30  --   ALT 16  --   ALKPHOS 81  --   BILITOT 0.8  --    ------------------------------------------------------------------------------------------------------------------  Cardiac Enzymes Recent Labs  Lab 07/14/17 0311 07/14/17 0645  TROPONINI 0.18* 0.51*   ------------------------------------------------------------------------------------------------------------------  RADIOLOGY:  Dg Chest Portable 1 View  Result Date: 07/14/2017 CLINICAL DATA:  76 y/o  F; shortness of breath. EXAM: PORTABLE CHEST 1 VIEW COMPARISON:  03/02/2017 chest radiograph and chest CT FINDINGS: Stable normal cardiac silhouette. Aortic atherosclerosis with calcification. Clear lungs. No pleural effusion or pneumothorax. No acute osseous abnormality is evident. IMPRESSION: No active disease. Electronically Signed   By: Kristine Garbe M.D.   On: 07/14/2017 03:38     ASSESSMENT AND PLAN:   76 y/o female with CAD and COPD presents with SOB.  1. Acute hypoxic resp failure with COPD exacerbation/possible PNA Wean o2 as tolerated Order Ct chest r/o PE  2. COPD acute exacerbation: Continue IV steroids, Nebs and inhlaers  3. CAP: Start rocephin and Azithromycin   4.  Elevated troponin without chest pain with hx of CAD:  [facility]  cardiology consult requeted   Continue ASA, Plavix,metoprolol  5 PVD/AAA/bilateral CAS Follows with vascular surgery AAA is stable 3.0 cm   6. Hyponatremia: nephrology consult    Management plans discussed with the patient and she is in agreement.  CODE STATUS: FULL  TOTAL TIME TAKING CARE OF THIS PATIENT: 31 minutes.     POSSIBLE D/C 2-4 days, DEPENDING ON CLINICAL CONDITION.   Libni Fusaro M.D on 07/14/2017 at 11:13 AM  Between 7am to 6pm - Pager - [phone] After 6pm go to [email or web address] - password [email or web address] [facility]  Office  [phone]  CC: Primary care physician; Lavera Guise, MD  Note: This dictation was prepared with Dragon dictation along with smaller phrase technology. Any transcriptional errors that result from this process are unintentional.

## 2017-07-14 NOTE — H&P (Addendum)
Christina Hester is an 76 y.o. female.   Chief Complaint: Shortness of breath HPI: The patient with past medical history of coronary artery disease, stable AAA, hypertension and COPD presents to the emergency department complaining of shortness of breath.  The patient states that she became gradually more dyspneic over the last week.  She reports subjective fevers (alternating hot and cold) as well as productive cough.  She reports occasional green sputum.  In route the emergency department the patient received Solu-Medrol 125 mg.  She is continued to require breathing treatments.  Laboratory evaluation also revealed severe hyponatremia which prompted the emergency department staff to call the hospitalist service for further management.  Past Medical History:  Diagnosis Date  . AAA (abdominal aortic aneurysm) ([place])    a. 05/2016 Abd U/S: 3.02 x 3.02 AAA.  Marland Kitchen CAD (coronary artery disease)    a. 02/2017 NSTEMI/Cath: LM nl, LAD mild dzs, D2 min irregs, LCX mild dzs, OM2 50ost, RCA 95p, 192m L-->R collats, EF 55-65%.  .[place]D (ch[place] obstructive [place] disease) [place]   . Diastolic dysfunction    a. 02/2017 Echo: EF 55-60%, gr1 DD, ? inf HK, mild MR.  .Marland KitchenHyperlipidemia   . Hypertension   . [place]    a. 05/2016 ABI's: R 1.22, L 1.11.  . Tobacco abuse     Past Surgical History:  Procedure Laterality Date  . ABDOMINAL HYSTERECTOMY    . BREAST EXCISIONAL BIOPSY Left 20+ YRS AGO   NEG  . LEFT HEART CATH Bilateral 03/05/2017   Procedure: Left Heart Cath with poss PCI;  Surgeon: AWellington Hampshire MD;  [facility];  Service: Cardiovascular;  Laterality: Bilateral;    Family History  Problem Relation Age of Onset  . Breast cancer Mother   . Breast cancer Maternal Aunt   . Breast cancer Maternal Aunt   . Breast cancer Maternal Aunt    Social History:  reports that she has been smoking.  She has been smoking about 0.25 packs per day. she has never  used smokeless tobacco. She reports that she does not drink alcohol or use drugs.  Allergies:  Allergies  Allergen Reactions  . Aspirin Tinitus  . Prednisone Other (See Comments)    Makes violent  . Sulfa Antibiotics Nausea And Vomiting  . Symbicort [Budesonide-Formoterol Fumarate] Other (See Comments)    Patient felt "out of her mind" and angry.    Medications Prior to Admission  Medication Sig Dispense Refill  . amLODipine (NORVASC) 5 MG tablet Take 1 tablet (5 mg total) by mouth daily. 30 tablet 3  . buPROPion (WELLBUTRIN) 100 MG tablet Take 100 mg by mouth daily.    . clopidogrel (PLAVIX) 75 MG tablet Take 1 tablet (75 mg total) by mouth daily. (Patient taking differently: Take 75 mg by mouth at bedtime. ) 90 tablet 3  . ipratropium-albuterol (DUONEB) 0.5-2.5 (3) MG/3ML SOLN Take 3 mLs by nebulization every 4 (four) hours. (Patient taking differently: Take 3 mLs by nebulization every 4 (four) hours as needed. ) 360 mL 3  . losartan (COZAAR) 100 MG tablet Take 1 tablet (100 mg total) by mouth daily. 90 tablet 3  . Melatonin 5 MG TABS Take 5 mg by mouth at bedtime as needed (sleep).    . metoprolol tartrate (LOPRESSOR) 25 MG tablet Take 1 tablet (25 mg total) by mouth 2 (two) times daily. 60 tablet 3  . nitroGLYCERIN (NITROSTAT) 0.4 MG SL tablet Place 1 tablet (  0.4 mg total) under the tongue every 5 (five) minutes as needed for chest pain. 20 tablet 12  . tiotropium (SPIRIVA) 18 MCG inhalation capsule Place 18 mcg into inhaler and inhale daily.    Marland Kitchen guaiFENesin (MUCINEX) 600 MG 12 hr tablet Take 1 tablet (600 mg total) by mouth 2 (two) times daily. (Patient not taking: Reported on 07/14/2017) 20 tablet 0    Results for orders placed or performed during the hospital encounter of 07/14/17 (from the past 48 hour(s))  Troponin I     Status: Abnormal   Collection Time: 07/14/17  3:11 AM  Result Value Ref Range   Troponin I 0.18 (HH) <0.03 ng/mL    Comment: CRITICAL RESULT CALLED TO, READ  BACK BY AND VERIFIED WITH KAILEY WALKER AT 4076 07/14/17.PMH Performed at [facility], [address] [place], [place] [address]   Blood gas, venous     Status: Abnormal   Collection Time: 07/14/17  3:11 AM  Result Value Ref Range   pH, Ven 7.42 7.250 - 7.430   pCO2, Ven 38 (L) 44.0 - 60.0 mmHg   pO2, Ven 113.0 (H) 32.0 - 45.0 mmHg   Bicarbonate 24.6 20.0 - 28.0 mmol/L   Acid-Base Excess 0.3 0.0 - 2.0 mmol/L   O2 Saturation 98.5 %   Patient temperature 37.0    Collection site RIGHT ANTECUBITAL    Sample type VENOUS     Comment: Performed at [facility], [address] [place], [place] [address]  CBC with Differential     Status: Abnormal   Collection Time: 07/14/17  3:11 AM  Result Value Ref Range   WBC 12.5 (H) 3.6 - 11.0 K/uL   RBC 3.71 (L) 3.80 - 5.20 MIL/uL   Hemoglobin 11.6 (L) 12.0 - 16.0 g/dL   HCT 33.0 (L) 35.0 - 47.0 %   MCV 89.1 80.0 - 100.0 fL   MCH 31.3 26.0 - 34.0 pg   MCHC 35.1 32.0 - 36.0 g/dL   RDW 12.7 11.5 - 14.5 %   Platelets 239 150 - 440 K/uL    Comment: COUNT MAY BE INACCURATE DUE TO FIBRIN CLUMPS.   Neutrophils Relative % 81 %   Neutro Abs 10.0 (H) 1.4 - 6.5 K/uL   Lymphocytes Relative 12 %   Lymphs Abs 1.5 1.0 - 3.6 K/uL   Monocytes Relative 7 %   Monocytes Absolute 0.9 0.2 - 0.9 K/uL   Eosinophils Relative 0 %   Eosinophils Absolute 0.0 0 - 0.7 K/uL   Basophils Relative 0 %   Basophils Absolute 0.0 0 - 0.1 K/uL    Comment: Performed at [facility], [address] [place], [place] [address]  Comprehensive metabolic panel     Status: Abnormal   Collection Time: 07/14/17  3:11 AM  Result Value Ref Range   Sodium 118 (LL) 135 - 145 mmol/L    Comment: CRITICAL RESULT CALLED TO, READ BACK BY AND VERIFIED WITH KAILEY WALKER AT [phone] 07/14/17.PMH   Potassium 3.9 3.5 - 5.1 mmol/L   Chloride 86 (L) 101 - 111 mmol/L   CO2 21 (L) 22 - 32 mmol/L   Glucose, Bld 129 (H) 65 - 99 mg/dL   BUN 10 6 - 20 mg/dL   Creatinine, Ser  0.60 0.44 - 1.00 mg/dL   Calcium 8.6 (L) 8.9 - 10.3 mg/dL   Total Protein 7.0 6.5 - 8.1 g/dL   Albumin 3.6 3.5 - 5.0 g/dL   AST 30 15 - 41  U/L   ALT 16 14 - 54 U/L   Alkaline Phosphatase 81 38 - 126 U/L   Total Bilirubin 0.8 0.3 - 1.2 mg/dL   GFR calc non Af Amer >60 >60 mL/min   GFR calc Af Amer >60 >60 mL/min    Comment: (NOTE) The eGFR has been calculated using the CKD EPI equation. This calculation has not been validated in all clinical situations. eGFR's persistently <60 mL/min signify possible Chronic Kidney Disease.    Anion gap 11 5 - 15    Comment: Performed at [facility], [address] [place], [place] [address]  Brain natriuretic peptide     Status: Abnormal   Collection Time: 07/14/17  3:11 AM  Result Value Ref Range   B Natriuretic Peptide 195.0 (H) 0.0 - 100.0 pg/mL    Comment: Performed at [facility], [address] [place], [place] [address]  Influenza panel by PCR (type A & B)     Status: None   Collection Time: 07/14/17  4:26 AM  Result Value Ref Range   Influenza A By PCR NEGATIVE NEGATIVE   Influenza B By PCR NEGATIVE NEGATIVE    Comment: (NOTE) The Xpert Xpress Flu assay is intended as an aid in the diagnosis of  influenza and should not be used as a sole basis for treatment.  This  assay is FDA approved for nasopharyngeal swab specimens only. Nasal  washings and aspirates are unacceptable for Xpert Xpress Flu testing. Performed at [facility], [address] [place], [place] [address]   Troponin I     Status: Abnormal   Collection Time: 07/14/17  6:45 AM  Result Value Ref Range   Troponin I 0.51 (HH) <0.03 ng/mL    Comment: CRITICAL VALUE NOTED. VALUE IS CONSISTENT WITH PREVIOUSLY REPORTED/CALLED VALUE.PMH Performed at [facility], [address] [place], [place] [address]   Basic metabolic panel     Status: Abnormal   Collection Time: 07/14/17  6:45 AM  Result Value Ref Range   Sodium 121 (L) 135 -  145 mmol/L   Potassium 3.6 3.5 - 5.1 mmol/L   Chloride 91 (L) 101 - 111 mmol/L   CO2 21 (L) 22 - 32 mmol/L   Glucose, Bld 144 (H) 65 - 99 mg/dL   BUN 9 6 - 20 mg/dL   Creatinine, Ser 0.67 0.44 - 1.00 mg/dL   Calcium 8.4 (L) 8.9 - 10.3 mg/dL   GFR calc non Af Amer >60 >60 mL/min   GFR calc Af Amer >60 >60 mL/min    Comment: (NOTE) The eGFR has been calculated using the CKD EPI equation. This calculation has not been validated in all clinical situations. eGFR's persistently <60 mL/min signify possible Chronic Kidney Disease.    Anion gap 9 5 - 15    Comment: Performed at [facility], [address] [place],  [address]   Dg Chest Portable 1 View  Result Date: 07/14/2017 CLINICAL DATA:  76 y/o  F; shortness of breath. EXAM: PORTABLE CHEST 1 VIEW COMPARISON:  03/02/2017 chest radiograph and chest CT FINDINGS: Stable normal cardiac silhouette. Aortic atherosclerosis with calcification. Clear lungs. No pleural effusion or pneumothorax. No acute osseous abnormality is evident. IMPRESSION: No active disease. Electronically Signed   By: Kristine Garbe M.D.   On: 07/14/2017 03:38    Review of Systems  Constitutional: Positive for fever (Subjective). Negative for chills.  HENT: Negative for sore throat and tinnitus.   Eyes: Negative for  blurred vision and redness.  Respiratory: Positive for cough, sputum production and shortness of breath.   Cardiovascular: Negative for chest pain, palpitations, orthopnea and PND.  Gastrointestinal: Negative for abdominal pain, diarrhea, nausea and vomiting.  Genitourinary: Negative for dysuria, frequency and urgency.  Musculoskeletal: Negative for joint pain and myalgias.  Skin: Negative for rash.       No lesions  Neurological: Negative for speech change, focal weakness and weakness.  Endo/Heme/Allergies: Does not bruise/bleed easily.       No temperature intolerance  Psychiatric/Behavioral: Negative for depression and  suicidal ideas.    Blood pressure (!) 122/58, pulse 100, temperature 98.7 F (37.1 C), temperature source Oral, resp. rate 18, height 5' (1.524 m), weight 38.1 kg (84 lb), SpO2 96 %. Physical Exam  Vitals reviewed. Constitutional: She is oriented to person, place, and time. She appears well-developed and well-nourished. No distress.  HENT:  Head: Normocephalic and atraumatic.  Mouth/Throat: Oropharynx is clear and moist.  Eyes: Conjunctivae and EOM are normal. Pupils are equal, round, and reactive to light. No scleral icterus.  Neck: Normal range of motion. Neck supple. No JVD present. No tracheal deviation present. No thyromegaly present.  Cardiovascular: Normal rate, regular rhythm and normal heart sounds. Exam reveals no gallop and no friction rub.  No murmur heard. Respiratory: Effort normal and breath sounds normal.  GI: Soft. Bowel sounds are normal. She exhibits no distension. There is no tenderness.  Genitourinary:  Genitourinary Comments: Deferred  Musculoskeletal: Normal range of motion. She exhibits no edema.  Lymphadenopathy:    She has no cervical adenopathy.  Neurological: She is alert and oriented to person, place, and time. No cranial nerve deficit. She exhibits normal muscle tone.  Skin: Skin is warm and dry. No rash noted. No erythema.  Psychiatric: She has a normal mood and affect. Her behavior is normal. Judgment and thought content normal.     Assessment/Plan This is a 76 year old female admitted for hyponatremia. 1.  Hyponatremia: Secondary to chronic lung disease.  Treated with intravenous fluid.  Encourage oral intake as well. 2.  COPD: With exacerbation; given purulent sputum must rule out pneumonia.  Recommend CT of the chest.  Continue inhaled corticosteroid as well as Spiriva.  Albuterol as needed. 3.  Elevated troponin: Likely secondary to demand ischemia.  Continue to follow cardiac biomarkers.  Monitor telemetry.  Consult cardiology at the discretion of  the primary team.  Continue Plavix.  Nitroglycerin as needed for chest pain. 4.  Essential hypertension: Relatively controlled; continue amlodipine, losartan and metoprolol 5.  DVT prophylaxis: Lovenox 6.  GI prophylaxis: None The patient is a full code.  Time spent on admission orders and patient care approximately 45 minutes  Harrie Foreman, MD 07/14/2017, 7:44 AM

## 2017-07-14 NOTE — ED Provider Notes (Signed)
[facility] Emergency Department Provider Note ____________________________________________   First MD Initiated Contact with Patient 07/14/17 [phone]     (approximate)  I have reviewed the triage vital signs and the nursing notes.   HISTORY  Chief Complaint Shortness of Breath    HPI Christina Hester is a 76 y.o. female with past medical history as noted below who presents with shortness of breath, gradual onset over the last 1-2 days, associated with nonproductive cough, but not associated with fever or chest pain.  Patient reports having a few sick contacts in her family with respiratory infections recently.  Past Medical History:  Diagnosis Date  . AAA (abdominal aortic aneurysm) ([place])    a. 05/2016 Abd U/S: 3.02 x 3.02 AAA.  Marland Kitchen CAD (coronary artery disease)    a. 02/2017 NSTEMI/Cath: LM nl, LAD mild dzs, D2 min irregs, LCX mild dzs, OM2 50ost, RCA 95p, 165m, L-->R collats, EF 55-65%.  Marland Kitchen COPD (chronic obstructive pulmonary disease) ([place])   . Diastolic dysfunction    a. 02/2017 Echo: EF 55-60%, gr1 DD, ? inf HK, mild MR.  Marland Kitchen Hyperlipidemia   . Hypertension   . PAD (peripheral artery disease) ([place])    a. 05/2016 ABI's: R 1.22, L 1.11.  . Tobacco abuse     Patient Active Problem List   Diagnosis Date Noted  . Carotid stenosis 05/18/2017  . Coronary artery disease 03/06/2017  . S/P cardiac cath 03/06/2017  . Unstable angina ([employer]) 03/04/2017  . Demand ischemia ([place]) 03/04/2017  . COPD with acute exacerbation ([place]) 03/03/2017  . Left sided chest pain 03/03/2017  . Multifocal pneumonia 03/03/2017  . Hyponatremia 03/03/2017  . Dehydration 03/03/2017  . Leukocytosis 03/03/2017  . Acute on chronic respiratory failure ([place]) 03/02/2017  . Pneumonia 07/18/2016  . AAA (abdominal aortic aneurysm) without rupture ([place]) 05/17/2016  . PVD (peripheral vascular disease) ([place]) 05/17/2016  . Protein-calorie malnutrition, severe 04/29/2016  . Community acquired  pneumonia 04/28/2016  . Chest pain 11/25/2015    Past Surgical History:  Procedure Laterality Date  . ABDOMINAL HYSTERECTOMY    . BREAST EXCISIONAL BIOPSY Left 20+ YRS AGO   NEG  . LEFT HEART CATH Bilateral 03/05/2017   Procedure: Left Heart Cath with poss PCI;  Surgeon: Wellington Hampshire, MD;  Location: [place] [facility];  Service: Cardiovascular;  Laterality: Bilateral;    Prior to Admission medications   Medication Sig Start Date End Date Taking? Authorizing Provider  amLODipine (NORVASC) 5 MG tablet Take 1 tablet (5 mg total) by mouth daily. 03/06/17  Yes Theodoro Grist, MD  buPROPion (WELLBUTRIN) 100 MG tablet Take 100 mg by mouth daily.   Yes [provider]  clopidogrel (PLAVIX) 75 MG tablet Take 1 tablet (75 mg total) by mouth daily. Patient taking differently: Take 75 mg by mouth at bedtime.  03/12/17  Yes Wellington Hampshire, MD  ipratropium-albuterol (DUONEB) 0.5-2.5 (3) MG/3ML SOLN Take 3 mLs by nebulization every 4 (four) hours. Patient taking differently: Take 3 mLs by nebulization every 4 (four) hours as needed.  03/03/17  Yes Theodoro Grist, MD  losartan (COZAAR) 100 MG tablet Take 1 tablet (100 mg total) by mouth daily. 04/17/17 07/16/17 Yes Theora Gianotti, NP  Melatonin 5 MG TABS Take 5 mg by mouth at bedtime as needed (sleep).   Yes [provider]  metoprolol tartrate (LOPRESSOR) 25 MG tablet Take 1 tablet (25 mg total) by mouth 2 (two) times daily. 03/06/17  Yes Theodoro Grist, MD  nitroGLYCERIN (NITROSTAT) 0.4 MG SL tablet Place 1 tablet (0.4 mg total) under the tongue every 5 (five) minutes as needed for chest pain. 03/06/17  Yes Theodoro Grist, MD  tiotropium (SPIRIVA) 18 MCG inhalation capsule Place 18 mcg into inhaler and inhale daily.   Yes [provider]  guaiFENesin (MUCINEX) 600 MG 12 hr tablet Take 1 tablet (600 mg total) by mouth 2 (two) times daily. Patient not taking: Reported on 07/14/2017 03/03/17   Theodoro Grist, MD     Allergies Aspirin; Prednisone; Sulfa antibiotics; and Symbicort [budesonide-formoterol fumarate]  Family History  Problem Relation Age of Onset  . Breast cancer Mother   . Breast cancer Maternal Aunt   . Breast cancer Maternal Aunt   . Breast cancer Maternal Aunt     Social History Social History   Tobacco Use  . Smoking status: Light Tobacco Smoker    Packs/day: 0.25  . Smokeless tobacco: Never Used  . Tobacco comment: off and on; not every day.  Substance Use Topics  . Alcohol use: No  . Drug use: No    Review of Systems  Constitutional: No fever. Eyes: No redness. ENT: No sore throat. Cardiovascular: Denies chest pain. Respiratory: Positive for shortness of breath. Gastrointestinal: No nausea, no vomiting.   Genitourinary: Negative for dysuria.  Musculoskeletal: Negative for back pain. Skin: Negative for rash. Neurological: Negative for headache.  ____________________________________________   PHYSICAL EXAM:  VITAL SIGNS: ED Triage Vitals [07/14/17 0313]  Enc Vitals Group     BP (!) 158/35     Pulse Rate (!) 116     Resp 20     Temp 98.7 F (37.1 C)     Temp Source Oral     SpO2 98 %     Weight 84 lb (38.1 kg)     Height 5' (1.524 m)     Head Circumference      Peak Flow      Pain Score      Pain Loc      Pain Edu?      Excl. in [place]?     Constitutional: Alert and oriented.  Comfortable appearing. Eyes: Conjunctivae are normal.  Head: Atraumatic. Nose: No congestion/rhinnorhea. Mouth/Throat: Mucous membranes are slightly dry.   Neck: Normal range of motion.  Cardiovascular: Tachycardic, regular rhythm. Grossly normal heart sounds.  Good peripheral circulation. Respiratory: Increased respiratory effort.   Scattered rhonchi, but no wheeze. Gastrointestinal: Soft and nontender. No distention.  Genitourinary: No flank tenderness. Musculoskeletal: No lower extremity edema.  Extremities warm and well perfused.  Neurologic:  Normal speech and  language. No gross focal neurologic deficits are appreciated.  Skin:  Skin is warm and dry. No rash noted. Psychiatric: Mood and affect are normal. Speech and behavior are normal.  ____________________________________________   LABS (all labs ordered are listed, but only abnormal results are displayed)  Labs Reviewed  TROPONIN I - Abnormal; Notable for the following components:      Result Value   Troponin I 0.18 (*)    All other components within normal limits  BLOOD GAS, VENOUS - Abnormal; Notable for the following components:   pCO2, Ven 38 (*)    pO2, Ven 113.0 (*)    All other components within normal limits  CBC WITH DIFFERENTIAL/PLATELET - Abnormal; Notable for the following components:   WBC 12.5 (*)    RBC 3.71 (*)    Hemoglobin 11.6 (*)    HCT 33.0 (*)    Neutro Abs 10.0 (*)  All other components within normal limits  COMPREHENSIVE METABOLIC PANEL - Abnormal; Notable for the following components:   Sodium 118 (*)    Chloride 86 (*)    CO2 21 (*)    Glucose, Bld 129 (*)    Calcium 8.6 (*)    All other components within normal limits  BRAIN NATRIURETIC PEPTIDE - Abnormal; Notable for the following components:   B Natriuretic Peptide 195.0 (*)    All other components within normal limits  INFLUENZA PANEL BY PCR (TYPE A & B)  TROPONIN I  BASIC METABOLIC PANEL   ____________________________________________  EKG  ED ECG REPORT I, Arta Silence, the attending physician, personally viewed and interpreted this ECG.  Date: 07/14/2017 EKG Time: 309 Rate: 115 Rhythm: Sinus tachycardia QRS Axis: Borderline right axis Intervals: normal ST/T Wave abnormalities: LVH, nonspecific findings Narrative Interpretation: no evidence of acute ischemia; other than rate, no significant change when compared to EKG of 03/05/2016  ____________________________________________  RADIOLOGY  Chest x-ray: No focal  infiltrate  ____________________________________________   PROCEDURES  Procedure(s) performed: No    Critical Care performed: Yes  CRITICAL CARE Performed by: Arta Silence   Total critical care time: 35 minutes  Critical care time was exclusive of separately billable procedures and treating other patients.  Critical care was necessary to treat or prevent imminent or life-threatening deterioration.  Critical care was time spent personally by me on the following activities: development of treatment plan with patient and/or surrogate as well as nursing, discussions with consultants, evaluation of patient's response to treatment, examination of patient, obtaining history from patient or surrogate, ordering and performing treatments and interventions, ordering and review of laboratory studies, ordering and review of radiographic studies, pulse oximetry and re-evaluation of patient's condition. ____________________________________________   INITIAL IMPRESSION / ASSESSMENT AND PLAN / ED COURSE  Pertinent labs & imaging results that were available during my care of the patient were reviewed by me and considered in my medical decision making (see chart for details).  76 year old female with past medical history as noted above including history of COPD presents with shortness of breath over 1 day associated with cough.  Patient has sick contacts in her family.  Past medical records reviewed in Epic; the patient was last admitted to the hospital in August of this year for COPD exacerbation with respiratory failure, and hyponatremia.  On exam, patient is tachycardic and tachypneic, with increased respiratory effort although speaking in full sentences.  Lungs with no significant wheeze or rales, but coarse breath sounds and rhonchi.    Differential includes COPD exacerbation, acute bronchitis, influenza or other viral syndrome, pneumonia, or less likely CHF or other cardiac cause.   Plan for chest x-ray, lab workup, and nebs.  Patient given Solu-Medrol by EMS.  She is maintaining O2 sat in the high 90s on nasal cannula, so no indication for BiPAP at this time.  Clinical Course as of Jul 14 556  Sat Jul 14, 2017  0352 DG Chest Portable 1 View [SS]    Clinical Course User Index [SS] Arta Silence, MD   ----------------------------------------- 5:55 AM on 07/14/2017 -----------------------------------------  Patient's respiratory status has somewhat improved.  She reports feeling less short of breath.  She still has not required BiPAP.  Chest x-ray does not show focal infiltrate, and patient's flu is negative.  Lab workup is mainly significant for hyponatremia which appears to be acute on chronic.  There is no indication for hypertonic saline at this time, and patient has no history  of CHF and a relatively normal EF on her most recent echo, so normal saline has been started.  Patient's troponin is also slightly elevated, likely from strain.  No indication for heparinization at this time.  Patient will require admission due to her hypoxia as well as the significant hyponatremia.  I signed the patient out to the hospitalist, Dr. Marcille Blanco.  ____________________________________________   FINAL CLINICAL IMPRESSION(S) / ED DIAGNOSES  Final diagnoses:  COPD exacerbation ([place])  Respiratory distress  Hyponatremia      NEW MEDICATIONS STARTED DURING THIS VISIT:  This SmartLink is deprecated. Use AVSMEDLIST instead to display the medication list for a patient.   Note:  This document was prepared using Dragon voice recognition software and may include unintentional dictation errors.    Arta Silence, MD 07/14/17 [phone]

## 2017-07-14 NOTE — Consult Note (Signed)
Date: 07/14/2017                  Patient Name:  Christina Hester  MRN: [id number]  DOB: Apr 28, 1941  Age / Sex: 76 y.o., female         PCP: Lavera Guise, MD                 Service Requesting Consult: IM/ Bettey Costa, MD                 Reason for Consult: Hyponatremia            History of Present Illness: Patient is a 76 y.o. female with medical problems of coronary artery disease, AAA, hypertension, COPD, who was admitted to [facility] on 07/14/2017 for evaluation of worsening shortness of breath.  She is admitted for COPD exacerbation and is being treated with steroids and bronchodilators.  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  This morning sodium has improved to 121.  BNP level is 195.  Hemoglobin is mildly low at 11.6.  TSH is normal.  Flu test is negative.  Urinalysis and urine electrolytes are not available.  Chest x-ray is negative for any active disease.Echo in August 2018 shows LVEF 55-60 %  Patient reports excessive coughing that got worse over the last 1-2 days She also reports that she likes to drink a lot of water   Medications: Outpatient medications: Medications Prior to Admission  Medication Sig Dispense Refill Last Dose  . amLODipine (NORVASC) 5 MG tablet Take 1 tablet (5 mg total) by mouth daily. 30 tablet 3 07/13/2017 at Unknown time  . buPROPion (WELLBUTRIN) 100 MG tablet Take 100 mg by mouth daily.   07/13/2017 at Unknown time  . clopidogrel (PLAVIX) 75 MG tablet Take 1 tablet (75 mg total) by mouth daily. (Patient taking differently: Take 75 mg by mouth at bedtime. ) 90 tablet 3 07/13/2017 at Unknown time  . ipratropium-albuterol (DUONEB) 0.5-2.5 (3) MG/3ML SOLN Take 3 mLs by nebulization every 4 (four) hours. (Patient taking differently: Take 3 mLs by nebulization every 4 (four) hours as needed. ) 360 mL 3 prn  . losartan (COZAAR) 100 MG tablet Take 1 tablet (100 mg total) by mouth daily. 90  tablet 3 07/13/2017 at Unknown time  . Melatonin 5 MG TABS Take 5 mg by mouth at bedtime as needed (sleep).     . metoprolol tartrate (LOPRESSOR) 25 MG tablet Take 1 tablet (25 mg total) by mouth 2 (two) times daily. 60 tablet 3 07/13/2017 at Unknown time  . nitroGLYCERIN (NITROSTAT) 0.4 MG SL tablet Place 1 tablet (0.4 mg total) under the tongue every 5 (five) minutes as needed for chest pain. 20 tablet 12 Taking  . tiotropium (SPIRIVA) 18 MCG inhalation capsule Place 18 mcg into inhaler and inhale daily.   07/13/2017 at Unknown time  . guaiFENesin (MUCINEX) 600 MG 12 hr tablet Take 1 tablet (600 mg total) by mouth 2 (two) times daily. (Patient not taking: Reported on 07/14/2017) 20 tablet 0 Not Taking at Unknown time    Current medications: Current Facility-Administered Medications  Medication Dose Route Frequency Provider Last Rate Last Dose  . 0.9 %  sodium chloride infusion   Intravenous Continuous Harrie Foreman, MD      . acetaminophen (TYLENOL) tablet 650 mg  650 mg Oral Q6H PRN Harrie Foreman, MD       Or  .  acetaminophen (TYLENOL) suppository 650 mg  650 mg Rectal Q6H PRN Harrie Foreman, MD      . amLODipine (NORVASC) tablet 5 mg  5 mg Oral Daily Harrie Foreman, MD      . azithromycin [facility]) tablet 500 mg  500 mg Oral Daily Harrie Foreman, MD       Followed by  . [START ON 07/15/2017] azithromycin (ZITHROMAX) tablet 250 mg  250 mg Oral Daily Harrie Foreman, MD      . buPROPion [facility]) tablet 100 mg  100 mg Oral Daily Harrie Foreman, MD      . clopidogrel (PLAVIX) tablet 75 mg  75 mg Oral QHS Harrie Foreman, MD      . docusate sodium (COLACE) capsule 100 mg  100 mg Oral BID Harrie Foreman, MD      . enoxaparin (LOVENOX) injection 40 mg  40 mg Subcutaneous Q24H Harrie Foreman, MD      . ipratropium-albuterol (DUONEB) 0.5-2.5 (3) MG/3ML nebulizer solution 3 mL  3 mL Nebulization Q4H PRN Harrie Foreman, MD   3 mL at 07/14/17 1027  .  losartan (COZAAR) tablet 100 mg  100 mg Oral Daily Harrie Foreman, MD      . Melatonin TABS 5 mg  5 mg Oral QHS PRN Harrie Foreman, MD      . methylPREDNISolone sodium succinate (SOLU-MEDROL) 125 mg/2 mL injection 60 mg  60 mg Intravenous Daily Harrie Foreman, MD      . metoprolol tartrate (LOPRESSOR) tablet 25 mg  25 mg Oral BID Harrie Foreman, MD      . nitroGLYCERIN (NITROSTAT) SL tablet 0.4 mg  0.4 mg Sublingual Q5 min PRN Harrie Foreman, MD      . ondansetron [facility]) tablet 4 mg  4 mg Oral Q6H PRN Harrie Foreman, MD       Or  . ondansetron [facility]) injection 4 mg  4 mg Intravenous Q6H PRN Harrie Foreman, MD      . tiotropium [facility]) inhalation capsule 18 mcg  18 mcg Inhalation q morning - 10a Harrie Foreman, MD          Allergies: Allergies  Allergen Reactions  . Aspirin Tinitus  . Prednisone Other (See Comments)    Makes violent  . Sulfa Antibiotics Nausea And Vomiting  . Symbicort [Budesonide-Formoterol Fumarate] Other (See Comments)    Patient felt "out of her mind" and angry.      Past Medical History: Past Medical History:  Diagnosis Date  . AAA (abdominal aortic aneurysm) ([place])    a. 05/2016 Abd U/S: 3.02 x 3.02 AAA.  Marland Kitchen CAD (coronary artery disease)    a. 02/2017 NSTEMI/Cath: LM nl, LAD mild dzs, D2 min irregs, LCX mild dzs, OM2 50ost, RCA 95p, 141m, L-->R collats, EF 55-65%.  Marland Kitchen COPD (chronic obstructive pulmonary disease) ([place])   . Diastolic dysfunction    a. 02/2017 Echo: EF 55-60%, gr1 DD, ? inf HK, mild MR.  Marland Kitchen Hyperlipidemia   . Hypertension   . PAD (peripheral artery disease) ([place])    a. 05/2016 ABI's: R 1.22, L 1.11.  . Tobacco abuse      Past Surgical History: Past Surgical History:  Procedure Laterality Date  . ABDOMINAL HYSTERECTOMY    . BREAST EXCISIONAL BIOPSY Left 20+ YRS AGO   NEG  . LEFT HEART CATH Bilateral 03/05/2017   Procedure: Left Heart Cath with poss PCI;  Surgeon: Wellington Hampshire,  MD;  Location: [place] [facility];  Service: Cardiovascular;  Laterality: Bilateral;     Family History: Family History  Problem Relation Age of Onset  . Breast cancer Mother   . Breast cancer Maternal Aunt   . Breast cancer Maternal Aunt   . Breast cancer Maternal Aunt      Social History: Social History   Socioeconomic History  . Marital status: Divorced    Spouse name: Not on file  . Number of children: Not on file  . Years of education: Not on file  . Highest education level: Not on file  Social Needs  . Financial resource strain: Not on file  . Food insecurity - worry: Not on file  . Food insecurity - inability: Not on file  . Transportation needs - medical: Not on file  . Transportation needs - non-medical: Not on file  Occupational History  . Not on file  Tobacco Use  . Smoking status: Light Tobacco Smoker    Packs/day: 0.25  . Smokeless tobacco: Never Used  . Tobacco comment: off and on; not every day.  Substance and Sexual Activity  . Alcohol use: No  . Drug use: No  . Sexual activity: Not on file  Other Topics Concern  . Not on file  Social History Narrative  . Not on file     Review of Systems: Gen: No fevers or chills HEENT: No vision or hearing problems CV: No chest pain  Resp: Shortness of breath, cough, sputum production GI: No nausea or vomiting GU : Reports frequency of voiding MS: No complaints Derm:  No acute complaints Psych: No complaints Heme: No complaints Neuro: No complaints Endocrine.  No complaints  Vital Signs: Blood pressure (!) 164/80, pulse (!) 101, temperature 97.7 F (36.5 C), temperature source Oral, resp. rate 20, height 5' (1.524 m), weight 39.2 kg (86 lb 6.4 oz), SpO2 96 %.  No intake or output data in the 24 hours ending 07/14/17 1044  Weight trends: Filed Weights   07/14/17 0313 07/14/17 0930  Weight: 38.1 kg (84 lb) 39.2 kg (86 lb 6.4 oz)    Physical Exam: General:  No acute distress, thin, sitting up in bed  HEENT   anicteric, moist oral mucous membranes  Neck:  Distended neck veins  Lungs:  Mild to moderate diffuse rhonchi, basilar crackles  Heart::  Tachycardic  Abdomen:  Soft, nontender  Extremities:  No edema  Neurologic:  Alert, oriented  Skin:  No acute rashes             Lab results: Basic Metabolic Panel: Recent Labs  Lab 07/14/17 0311 07/14/17 0645  NA 118* 121*  K 3.9 3.6  CL 86* 91*  CO2 21* 21*  GLUCOSE 129* 144*  BUN 10 9  CREATININE 0.60 0.67  CALCIUM 8.6* 8.4*    Liver Function Tests: Recent Labs  Lab 07/14/17 0311  AST 30  ALT 16  ALKPHOS 81  BILITOT 0.8  PROT 7.0  ALBUMIN 3.6   No results for input(s): LIPASE, AMYLASE in the last 168 hours. No results for input(s): AMMONIA in the last 168 hours.  CBC: Recent Labs  Lab 07/14/17 0311  WBC 12.5*  NEUTROABS 10.0*  HGB 11.6*  HCT 33.0*  MCV 89.1  PLT 239    Cardiac Enzymes: Recent Labs  Lab 07/14/17 0645  TROPONINI 0.51*    BNP: Invalid input(s): POCBNP  CBG: No results for input(s): GLUCAP in the last 168 hours.  Microbiology: No  results found for this or any previous visit (from the past 720 hour(s)).   Coagulation Studies: No results for input(s): LABPROT, INR in the last 72 hours.  Urinalysis: No results for input(s): COLORURINE, LABSPEC, PHURINE, GLUCOSEU, HGBUR, BILIRUBINUR, KETONESUR, PROTEINUR, UROBILINOGEN, NITRITE, LEUKOCYTESUR in the last 72 hours.  Invalid input(s): APPERANCEUR      Imaging: Dg Chest Portable 1 View  Result Date: 07/14/2017 CLINICAL DATA:  76 y/o  F; shortness of breath. EXAM: PORTABLE CHEST 1 VIEW COMPARISON:  03/02/2017 chest radiograph and chest CT FINDINGS: Stable normal cardiac silhouette. Aortic atherosclerosis with calcification. Clear lungs. No pleural effusion or pneumothorax. No acute osseous abnormality is evident. IMPRESSION: No active disease. Electronically Signed   By: Kristine Garbe M.D.   On: 07/14/2017 03:38       Assessment & Plan: Pt is a 76 y.o. Caucasian female with coronary artery disease, AAA, hypertension, COPD, was admitted on 07/14/2017 with COPD exacerbation and is found to have worsening hyponatremia  1.  Chronic hyponatremia with acute exacerbation Chronic hyponatremia may be related to underlying lung disease Patient also follows a tea and toast diet.  Thyroid function studies are normal We will obtain SPEP, UPEP, urine electrolytes Patient was encouraged to drink "boost" as protein and vitamin supplement Limit excessive habitual water drinking One dose of lasix to correct volume overload

## 2017-07-14 NOTE — Progress Notes (Addendum)
RT called to room to assess patient due to increased WOB and Wheezing, When RT entered room, pt was in severe distress despite RN administering neb treatment, Dr. Benjie Karvonen gave verbal order over telephone for BIpap to RT and to transfer to ICU.

## 2017-07-14 NOTE — Consult Note (Signed)
PULMONARY / CRITICAL CARE MEDICINE  Name: Christina Hester MRN: [id number] DOB: [birth year]    ADMISSION DATE:  07/14/2017   CONSULTATION DATE:  07/14/2017  REFERRING MD:  Dr. Benjie Karvonen  REASON: Acute respiratory failure  HISTORY OF PRESENT ILLNESS: This is a 76 year old female with a known history of COPD who presented to the ED with worsening shortness of breath, and a nonproductive cough over 2 days.  She was given IV steroids and bronchodilators.  She was started on antibiotics for community-acquired pneumonia and admitted to the floor.  At about 5 PM patient became acutely short of breath and was transferred to the ICU for BiPAP.  She reports moderate improvement in symptoms but remains severely dyspneic with minimal activity.  PAST MEDICAL HISTORY :  She  has a past medical history of AAA (abdominal aortic aneurysm) ([place]), CAD (coronary artery disease), COPD (chronic obstructive pulmonary disease) ([place]), Diastolic dysfunction, Hyperlipidemia, Hypertension, PAD (peripheral artery disease) ([place]), and Tobacco abuse.  PAST SURGICAL HISTORY: She  has a past surgical history that includes Abdominal hysterectomy; Breast excisional biopsy (Left, 20+ YRS AGO); and Left Heart Cath (Bilateral, 03/05/2017).  Allergies  Allergen Reactions  . Aspirin Tinitus  . Prednisone Other (See Comments)    Makes violent  . Sulfa Antibiotics Nausea And Vomiting  . Symbicort [Budesonide-Formoterol Fumarate] Other (See Comments)    Patient felt "out of her mind" and angry.    No current facility-administered medications on file prior to encounter.    Current Outpatient Medications on File Prior to Encounter  Medication Sig  . amLODipine (NORVASC) 5 MG tablet Take 1 tablet (5 mg total) by mouth daily.  Marland Kitchen buPROPion (WELLBUTRIN) 100 MG tablet Take 100 mg by mouth daily.  . clopidogrel (PLAVIX) 75 MG tablet Take 1 tablet (75 mg total) by mouth daily. (Patient taking differently: Take 75 mg by mouth at bedtime. )   . ipratropium-albuterol (DUONEB) 0.5-2.5 (3) MG/3ML SOLN Take 3 mLs by nebulization every 4 (four) hours. (Patient taking differently: Take 3 mLs by nebulization every 4 (four) hours as needed. )  . losartan (COZAAR) 100 MG tablet Take 1 tablet (100 mg total) by mouth daily.  . Melatonin 5 MG TABS Take 5 mg by mouth at bedtime as needed (sleep).  . metoprolol tartrate (LOPRESSOR) 25 MG tablet Take 1 tablet (25 mg total) by mouth 2 (two) times daily.  . nitroGLYCERIN (NITROSTAT) 0.4 MG SL tablet Place 1 tablet (0.4 mg total) under the tongue every 5 (five) minutes as needed for chest pain.  Marland Kitchen tiotropium (SPIRIVA) 18 MCG inhalation capsule Place 18 mcg into inhaler and inhale daily.  Marland Kitchen guaiFENesin (MUCINEX) 600 MG 12 hr tablet Take 1 tablet (600 mg total) by mouth 2 (two) times daily. (Patient not taking: Reported on 07/14/2017)    FAMILY HISTORY:  Her indicated that the status of her mother is unknown.   SOCIAL HISTORY: She  reports that she has been smoking.  She has been smoking about 0.25 packs per day. she has never used smokeless tobacco. She reports that she does not drink alcohol or use drugs.  REVIEW OF SYSTEMS:   Unable to obtain as patient is on continuous BiPAP  SUBJECTIVE:   VITAL SIGNS: BP 108/67   Pulse 88   Temp 98.6 F (37 C) (Axillary)   Resp 18   Ht 5' (1.524 m)   Wt 41.2 kg (90 lb 13.3 oz)   SpO2 98%   BMI 17.74 kg/m   HEMODYNAMICS:  VENTILATOR SETTINGS: FiO2 (%):  [30 %-40 %] 30 %  INTAKE / OUTPUT: I/O last 3 completed shifts: In: 1185.5 [P.O.:240; I.V.:645.5; IV Piggyback:300] Out: 200 [Urine:200]  PHYSICAL EXAMINATION: General: Cachectic, in moderate respiratory distress Neuro: Alert and oriented x3 moves all extremities follows commands HEENT: PERRLA, trachea midline, no JVD Cardiovascular: Pulse regular, S1-S2, no murmur regurg or gallop, no edema, +2 pulses Lungs: Increased work of breathing, short expiratory span, bilateral expiratory  wheezes, breath sounds diminished in the bases Abdomen: Nondistended, normal bowel sounds in all 4 quadrants Musculoskeletal: No deformities, positive range of motion  skin: Warm and dry  LABS:  BMET Recent Labs  Lab 07/14/17 0311 07/14/17 0645  NA 118* 121*  K 3.9 3.6  CL 86* 91*  CO2 21* 21*  BUN 10 9  CREATININE 0.60 0.67  GLUCOSE 129* 144*    Electrolytes Recent Labs  Lab 07/14/17 0311 07/14/17 0645  CALCIUM 8.6* 8.4*    CBC Recent Labs  Lab 07/14/17 0311  WBC 12.5*  HGB 11.6*  HCT 33.0*  PLT 239    Coag's No results for input(s): APTT, INR in the last 168 hours.  Sepsis Markers No results for input(s): LATICACIDVEN, PROCALCITON, O2SATVEN in the last 168 hours.  ABG Recent Labs  Lab 07/14/17 1722 07/14/17 1924  PHART 7.23* 7.27*  PCO2ART 60* 52*  PO2ART 71* 126*    Liver Enzymes Recent Labs  Lab 07/14/17 0311  AST 30  ALT 16  ALKPHOS 81  BILITOT 0.8  ALBUMIN 3.6    Cardiac Enzymes Recent Labs  Lab 07/14/17 0645 07/14/17 1125 07/14/17 1716  TROPONINI 0.51* 0.68* 1.07*    Glucose No results for input(s): GLUCAP in the last 168 hours.  Imaging Ct Angio Chest Pe W Or Wo Contrast  Result Date: 07/14/2017 CLINICAL DATA:  Acute hypoxic respiratory failure EXAM: CT ANGIOGRAPHY CHEST WITH CONTRAST TECHNIQUE: Multidetector CT imaging of the chest was performed using the standard protocol during bolus administration of intravenous contrast. Multiplanar CT image reconstructions and MIPs were obtained to evaluate the vascular anatomy. CONTRAST:  <See Chart> ISOVUE-370 IOPAMIDOL (ISOVUE-370) INJECTION 76% COMPARISON:  03/02/2017 FINDINGS: Cardiovascular: There are no filling defects in the pulmonary arterial tree to suggest acute pulmonary thromboembolism. There is no evidence of aortic aneurysm or dissection. Atherosclerotic calcification of the aortic arch and descending thoracic aorta are noted. There is calcified and smooth plaque just  beyond the origin of the left subclavian artery causing 50% narrowing. Mild 3 vessel coronary artery calcifications. Mediastinum/Nodes: No abnormal mediastinal adenopathy. No pericardial effusion. Lungs/Pleura: No pneumothorax or pleural effusion. Centrilobular emphysema. There is nodularity in the anterior right upper lobe, in the right middle lobe, and lingula, in the posterior left lower lobe, and in the central right lower lobe. Some of these areas have a tree-in-bud appearance. These findings suggest centrilobular nodules with mucoid plugging of small airways. Upper Abdomen: No acute abnormality. Musculoskeletal: No acute rib fracture. No vertebral compression deformity. Review of the MIP images confirms the above findings. IMPRESSION: No evidence of acute pulmonary thromboembolism. Centrilobular nodules throughout the lungs with some areas of tree-in-bud appearance. Findings suggest an inflammatory process of small peripheral airways. 50% narrowing at the origin of the left subclavian artery Aortic Atherosclerosis (ICD10-I70.0) and Emphysema (ICD10-J43.9). Electronically Signed   By: Marybelle Killings M.D.   On: 07/14/2017 13:16   Dg Chest Portable 1 View  Result Date: 07/14/2017 CLINICAL DATA:  76 y/o  F; shortness of breath. EXAM: PORTABLE CHEST 1 VIEW COMPARISON:  03/02/2017 chest radiograph and chest CT FINDINGS: Stable normal cardiac silhouette. Aortic atherosclerosis with calcification. Clear lungs. No pleural effusion or pneumothorax. No acute osseous abnormality is evident. IMPRESSION: No active disease. Electronically Signed   By: Kristine Garbe M.D.   On: 07/14/2017 03:38   STUDIES:  Last echo was in September 2018: LV EF: 55% -   60% CULTURES: None  ANTIBIOTICS: Azithromycin Ceftriaxone  SIGNIFICANT EVENTS: 07/14/2017: Admitted  LINES/TUBES: Peripheral IV  ASSESSMENT Acute hypoxic/hypercarbic respiratory failure Acute COPD exacerbation Doubt community-acquired  pneumonia Current tobacco use History of hypertension, hyperlipidemia, coronary artery disease, and peripheral artery disease  PLAN Hemodynamic monitoring per ICU protocol Continuous BiPAP and titrate to nasal cannula as tolerated As needed morphine and Precedex for air hunger Nebulized bronchodilators Nebulized steroids Hold IV steroids for now; it is documented that patient gets agitated with steroids Trend pro-calcitonin and discontinue antibiotics if not necessary Resume all home medications GI and DVT prophylaxis  FAMILY  - Updates: No family at bedside will update when available  - Inter-disciplinary family meet or Palliative Care meeting due by: day 7   Magdalene S. [facility] ANP-BC Pulmonary and [place] Pager [phone] or [phone]  07/14/2017, 10:18 PM

## 2017-07-14 NOTE — ED Notes (Signed)
Per pt prednisone is "ok in the hospital but not ok to go home with."

## 2017-07-14 NOTE — ED Triage Notes (Signed)
Pt arrived via ems from home with complaints of shortness of breath starting this morning. Pt has a history of copd and has been around small children with respiratory infection. Pt's initial oxygen saturation by ems was 84% and pt placed on 2L via nasal canula. Upon arrival to ED pt alert and oriented, able to communication, but breathing labored. Pt was given 125 of solu medrol and 2 duo nebs in route.

## 2017-07-14 NOTE — Progress Notes (Signed)
Pt to be transferred to icu 17 for bipap. Report called to pam.

## 2017-07-14 NOTE — Progress Notes (Signed)
Arrived from 2A. BIPAP on. Pt very anxious, agitated, labored resp with accessory muscle use and pulling off bipap. Dr Mortimer Fries aware. Morphine 2mg  iv now given. And precedex gtt started. Daughter at bedside.

## 2017-07-14 NOTE — ED Notes (Signed)
Attempted to call report, unavailable at this time.

## 2017-07-14 NOTE — Progress Notes (Signed)
Pt wakes up to talk to family at bedside. Frequently pulls at bipap mask and caued a nose abrasion. Gel pad to bridge of nose. Precedex now at 0.7. Report to oncoming RN

## 2017-07-14 NOTE — Progress Notes (Signed)
lovenox dose changed to 30mg  daily due to body weight <45kg  Christina Hester, Pharm.D, BCPS Clinical Pharmacist

## 2017-07-14 NOTE — Consult Note (Addendum)
Cardiology Consultation:   Patient ID: RAVEN FURNAS; [id number]; 01-02-1941   Admit date: 07/14/2017 Date of Consult: 07/14/2017  Primary Care Provider: Lavera Guise, MD Primary Cardiologist: Darlis Loan Physician requesting consult: Dr. Benjie Karvonen Reason for consult: Elevated troponin, known coronary artery disease   Patient Profile:   Christina Hester is a 76 y.o. female with a hx of  Coronary artery disease, occluded mid RCA with collaterals from left to right by catheterization March 05, 2017 Long history of smoking, COPD Prior COPD exacerbation Hypertension Chronic chest pain Who presents with worsening cough, shortness of breath Cardiology consulted for elevated troponin   History of Present Illness:   Christina Hester reports having increasing shortness of breath over the past week productive cough and fevers at home.   occasional green sputum.  She reports several sick contacts in her family with respiratory infections Symptoms were getting worse past 1-2 days and she decided to present to the emergency room She denies any significant chest pain on exertion, no PND orthopnea   received Solu-Medrol 125 mg.  Required breathing treatments in the emergency room  severe hyponatremia on initial lab work and given her COPD exacerbation, this prompted hospital admission  Initial troponin 0 0.18, repeat 0.5, additional repeat 0.68 Flu negative BNP 195,   Past Medical History:  Diagnosis Date  . AAA (abdominal aortic aneurysm) ([place])    a. 05/2016 Abd U/S: 3.02 x 3.02 AAA.  Marland Kitchen CAD (coronary artery disease)    a. 02/2017 NSTEMI/Cath: LM nl, LAD mild dzs, D2 min irregs, LCX mild dzs, OM2 50ost, RCA 95p, 162m, L-->R collats, EF 55-65%.  Marland Kitchen COPD (chronic obstructive pulmonary disease) ([place])   . Diastolic dysfunction    a. 02/2017 Echo: EF 55-60%, gr1 DD, ? inf HK, mild MR.  Marland Kitchen Hyperlipidemia   . Hypertension   . PAD (peripheral artery disease) ([place])    a. 05/2016 ABI's: R 1.22,  L 1.11.  . Tobacco abuse     Past Surgical History:  Procedure Laterality Date  . ABDOMINAL HYSTERECTOMY    . BREAST EXCISIONAL BIOPSY Left 20+ YRS AGO   NEG  . LEFT HEART CATH Bilateral 03/05/2017   Procedure: Left Heart Cath with poss PCI;  Surgeon: Wellington Hampshire, MD;  Location: [facility] [place];  Service: Cardiovascular;  Laterality: Bilateral;     Home Medications:  Prior to Admission medications   Medication Sig Start Date End Date Taking? Authorizing Provider  amLODipine (NORVASC) 5 MG tablet Take 1 tablet (5 mg total) by mouth daily. 03/06/17  Yes Theodoro Grist, MD  buPROPion (WELLBUTRIN) 100 MG tablet Take 100 mg by mouth daily.   Yes [provider]  clopidogrel (PLAVIX) 75 MG tablet Take 1 tablet (75 mg total) by mouth daily. Patient taking differently: Take 75 mg by mouth at bedtime.  03/12/17  Yes Wellington Hampshire, MD  ipratropium-albuterol (DUONEB) 0.5-2.5 (3) MG/3ML SOLN Take 3 mLs by nebulization every 4 (four) hours. Patient taking differently: Take 3 mLs by nebulization every 4 (four) hours as needed.  03/03/17  Yes Theodoro Grist, MD  losartan (COZAAR) 100 MG tablet Take 1 tablet (100 mg total) by mouth daily. 04/17/17 07/16/17 Yes Theora Gianotti, NP  Melatonin 5 MG TABS Take 5 mg by mouth at bedtime as needed (sleep).   Yes [provider]  metoprolol tartrate (LOPRESSOR) 25 MG tablet Take 1 tablet (25 mg total) by mouth 2 (two) times daily. 03/06/17  Yes Theodoro Grist, MD  nitroGLYCERIN (NITROSTAT) 0.4 MG SL tablet Place 1 tablet (0.4 mg total) under the tongue every 5 (five) minutes as needed for chest pain. 03/06/17  Yes Theodoro Grist, MD  tiotropium (SPIRIVA) 18 MCG inhalation capsule Place 18 mcg into inhaler and inhale daily.   Yes [provider]  guaiFENesin (MUCINEX) 600 MG 12 hr tablet Take 1 tablet (600 mg total) by mouth 2 (two) times daily. Patient not taking: Reported on 07/14/2017 03/03/17   Theodoro Grist, MD     Inpatient Medications: Scheduled Meds: . amLODipine  5 mg Oral Daily  . buPROPion  100 mg Oral Daily  . clopidogrel  75 mg Oral QHS  . docusate sodium  100 mg Oral BID  . enoxaparin (LOVENOX) injection  40 mg Subcutaneous Q24H  . feeding supplement  1 Container Oral Daily  . furosemide  20 mg Oral Once  . losartan  100 mg Oral Daily  . methylPREDNISolone (SOLU-MEDROL) injection  60 mg Intravenous Daily  . metoprolol tartrate  25 mg Oral BID  . tiotropium  18 mcg Inhalation q morning - 10a   Continuous Infusions: . sodium chloride 100 mL/hr at 07/14/17 1300  . azithromycin 500 mg (07/14/17 1409)  . cefTRIAXone (ROCEPHIN)  IV 1 g (07/14/17 1409)   PRN Meds: acetaminophen **OR** acetaminophen, ipratropium-albuterol, LORazepam, Melatonin, nitroGLYCERIN, ondansetron **OR** ondansetron (ZOFRAN) IV  Allergies:    Allergies  Allergen Reactions  . Aspirin Tinitus  . Prednisone Other (See Comments)    Makes violent  . Sulfa Antibiotics Nausea And Vomiting  . Symbicort [Budesonide-Formoterol Fumarate] Other (See Comments)    Patient felt "out of her mind" and angry.    Social History:   Social History   Socioeconomic History  . Marital status: Divorced    Spouse name: Not on file  . Number of children: Not on file  . Years of education: Not on file  . Highest education level: Not on file  Social Needs  . Financial resource strain: Not on file  . Food insecurity - worry: Not on file  . Food insecurity - inability: Not on file  . Transportation needs - medical: Not on file  . Transportation needs - non-medical: Not on file  Occupational History  . Not on file  Tobacco Use  . Smoking status: Light Tobacco Smoker    Packs/day: 0.25  . Smokeless tobacco: Never Used  . Tobacco comment: off and on; not every day.  Substance and Sexual Activity  . Alcohol use: No  . Drug use: No  . Sexual activity: Not on file  Other Topics Concern  . Not on file  Social History  Narrative  . Not on file    Family History:    Family History  Problem Relation Age of Onset  . Breast cancer Mother   . Breast cancer Maternal Aunt   . Breast cancer Maternal Aunt   . Breast cancer Maternal Aunt      ROS:  Please see the history of present illness.  Review of Systems  Constitution: Positive for malaise/fatigue. Negative for diaphoresis, fever, weakness and night sweats.  HENT: Negative.   Eyes: Negative.   Cardiovascular: Negative for chest pain, claudication, cyanosis, dyspnea on exertion, irregular heartbeat, leg swelling, near-syncope, orthopnea, palpitations and paroxysmal nocturnal dyspnea.  Respiratory: Positive for cough, shortness of breath and wheezing. Negative for sleep disturbances due to breathing.   Endocrine: Negative.   Hematologic/Lymphatic: Negative.   Skin: Negative.   Musculoskeletal: Negative for falls, joint pain,  joint swelling and myalgias.  Gastrointestinal: Negative.   Neurological: Negative for difficulty with concentration, excessive daytime sleepiness, dizziness, focal weakness, light-headedness and numbness.  Psychiatric/Behavioral: Negative.   All other systems reviewed and are negative.  All other ROS reviewed and negative.     Physical Exam/Data:   Vitals:   07/14/17 0846 07/14/17 0923 07/14/17 0930 07/14/17 1030  BP: (!) 166/79 (!) 164/80    Pulse: (!) 106 (!) 101    Resp: (!) 27 20    Temp:  97.7 F (36.5 C)    TempSrc:  Oral    SpO2: 97% 97%  96%  Weight:   86 lb 6.4 oz (39.2 kg)   Height:   5' (1.524 m)     Intake/Output Summary (Last 24 hours) at 07/14/2017 1435 Last data filed at 07/14/2017 1404 Gross per 24 hour  Intake 240 ml  Output 200 ml  Net 40 ml   Filed Weights   07/14/17 0313 07/14/17 0930  Weight: 84 lb (38.1 kg) 86 lb 6.4 oz (39.2 kg)   Body mass index is 16.87 kg/m.  General: in no acute distress, thin, on nasal cannula oxygen,  Significant coughing  HEENT: normal Lymph: no  adenopathy Neck: no JVD Endocrine:  No thryomegaly Vascular: No carotid bruits; FA pulses 2+ bilaterally without bruits  Cardiac:  normal S1, S2; RRR; no murmur  Lungs: Moderately decreased breath sounds bilaterally, +wheezes, Rales Abd: soft, nontender, no hepatomegaly  Ext: no edema Musculoskeletal:  No deformities, BUE and BLE strength normal and equal Skin: warm and dry  Neuro:  CNs 2-12 intact, no focal abnormalities noted Psych:  Normal affect   EKG:  The EKG was personally reviewed and demonstrates: Normal sinus rhythm Telemetry:  Telemetry was personally reviewed and demonstrates: Normal sinus rhythm  Relevant CV Studies: Cardiac catheterization 04-02-2017   The left ventricular systolic function is normal.  LV end diastolic pressure is mildly elevated.  The left ventricular ejection fraction is 55-65% by visual estimate.  Prox RCA lesion, 95 %stenosed.  Mid RCA lesion, 100 %stenosed.  Ost 2nd Mrg to 2nd Mrg lesion, 50 %stenosed.   1. Severe one-vessel coronary artery disease with chronically occluded right coronary artery with well-developed left-to-right collaterals. Mild to moderate nonobstructive disease affecting the left system with extremely tortuous coronary arteries. 2. Normal LV systolic function and mildly elevated left ventricular end-diastolic pressure.   Laboratory Data:  Chemistry Recent Labs  Lab 07/14/17 0311 07/14/17 0645  NA 118* 121*  K 3.9 3.6  CL 86* 91*  CO2 21* 21*  GLUCOSE 129* 144*  BUN 10 9  CREATININE 0.60 0.67  CALCIUM 8.6* 8.4*  GFRNONAA >60 >60  GFRAA >60 >60  ANIONGAP 11 9    Recent Labs  Lab 07/14/17 0311  PROT 7.0  ALBUMIN 3.6  AST 30  ALT 16  ALKPHOS 81  BILITOT 0.8   Hematology Recent Labs  Lab 07/14/17 0311  WBC 12.5*  RBC 3.71*  HGB 11.6*  HCT 33.0*  MCV 89.1  MCH 31.3  MCHC 35.1  RDW 12.7  PLT 239   Cardiac Enzymes Recent Labs  Lab 07/14/17 0311 07/14/17 0645 07/14/17 1125   TROPONINI 0.18* 0.51* 0.68*   No results for input(s): TROPIPOC in the last 168 hours.  BNP Recent Labs  Lab 07/14/17 0311  BNP 195.0*    DDimer No results for input(s): DDIMER in the last 168 hours.  Radiology/Studies:  Ct Angio Chest Pe W Or Wo Contrast  Result Date: 07/14/2017 CLINICAL DATA:  Acute hypoxic respiratory failure EXAM: CT ANGIOGRAPHY CHEST WITH CONTRAST TECHNIQUE: Multidetector CT imaging of the chest was performed using the standard protocol during bolus administration of intravenous contrast. Multiplanar CT image reconstructions and MIPs were obtained to evaluate the vascular anatomy. CONTRAST:  <See Chart> ISOVUE-370 IOPAMIDOL (ISOVUE-370) INJECTION 76% COMPARISON:  03/02/2017 FINDINGS: Cardiovascular: There are no filling defects in the pulmonary arterial tree to suggest acute pulmonary thromboembolism. There is no evidence of aortic aneurysm or dissection. Atherosclerotic calcification of the aortic arch and descending thoracic aorta are noted. There is calcified and smooth plaque just beyond the origin of the left subclavian artery causing 50% narrowing. Mild 3 vessel coronary artery calcifications. Mediastinum/Nodes: No abnormal mediastinal adenopathy. No pericardial effusion. Lungs/Pleura: No pneumothorax or pleural effusion. Centrilobular emphysema. There is nodularity in the anterior right upper lobe, in the right middle lobe, and lingula, in the posterior left lower lobe, and in the central right lower lobe. Some of these areas have a tree-in-bud appearance. These findings suggest centrilobular nodules with mucoid plugging of small airways. Upper Abdomen: No acute abnormality. Musculoskeletal: No acute rib fracture. No vertebral compression deformity. Review of the MIP images confirms the above findings. IMPRESSION: No evidence of acute pulmonary thromboembolism. Centrilobular nodules throughout the lungs with some areas of tree-in-bud appearance. Findings suggest an  inflammatory process of small peripheral airways. 50% narrowing at the origin of the left subclavian artery Aortic Atherosclerosis (ICD10-I70.0) and Emphysema (ICD10-J43.9). Electronically Signed   By: Marybelle Killings M.D.   On: 07/14/2017 13:16   Dg Chest Portable 1 View  Result Date: 07/14/2017 CLINICAL DATA:  76 y/o  F; shortness of breath. EXAM: PORTABLE CHEST 1 VIEW COMPARISON:  03/02/2017 chest radiograph and chest CT FINDINGS: Stable normal cardiac silhouette. Aortic atherosclerosis with calcification. Clear lungs. No pleural effusion or pneumothorax. No acute osseous abnormality is evident. IMPRESSION: No active disease. Electronically Signed   By: Kristine Garbe M.D.   On: 07/14/2017 03:38    Assessment and Plan:   1. Elevated troponin, demand ischemia In the setting of COPD exacerbation and known coronary artery disease, occluded RCA with collaterals from left to right No further ischemic workup needed at this time given recent cardiac catheterization several months ago, August 2018 But this is not a non-STEMI, not a primary ischemic event Continue outpatient medications, aspirin, Plavix, beta-blocker   2) acute respiratory distress COPD exacerbation pneumonia,  CT scan chest reviewed showing emphysema, no PE Long history of smoking On steroids, nebulizers, antibiotics broad-spectrum  3) CAD previous inferior wall MI,  Prior cardiac catheterization August 2018 with occluded mid RCA Collaterals from left to right, medical management recommended at that time  4) Hyponatremia Appears to be chronically low Worse on presentation, 118 Renal following, received Lasix Agree with recommendation for free water restriction    Total encounter time more than 110 minutes  Greater than 50% was spent in counseling and coordination of care with the patient   For questions or updates, please contact [facility] Please consult [email or web address] for contact info under  Cardiology/STEMI.   Signed, Ida Rogue, MD  07/14/2017 2:35 PM

## 2017-07-15 ENCOUNTER — Other Ambulatory Visit: Payer: Self-pay

## 2017-07-15 DIAGNOSIS — I25118 Atherosclerotic heart disease of native coronary artery with other forms of angina pectoris: Secondary | ICD-10-CM

## 2017-07-15 DIAGNOSIS — F419 Anxiety disorder, unspecified: Secondary | ICD-10-CM

## 2017-07-15 LAB — BASIC METABOLIC PANEL
ANION GAP: 5 (ref 5–15)
BUN: 17 mg/dL (ref 6–20)
CALCIUM: 8.6 mg/dL — AB (ref 8.9–10.3)
CHLORIDE: 98 mmol/L — AB (ref 101–111)
CO2: 24 mmol/L (ref 22–32)
CREATININE: 0.52 mg/dL (ref 0.44–1.00)
GFR calc non Af Amer: >60 mL/min (ref >60)
Glucose, Bld: 124 mg/dL — ABNORMAL HIGH (ref 65–99)
Potassium: 4.6 mmol/L (ref 3.5–5.1)
SODIUM: 127 mmol/L — AB (ref 135–145)

## 2017-07-15 LAB — PROCALCITONIN: Procalcitonin: 1.46 ng/mL

## 2017-07-15 LAB — BLOOD GAS, ARTERIAL
ACID-BASE EXCESS: 1.1 mmol/L (ref 0.0–2.0)
BICARBONATE: 27.1 mmol/L (ref 20.0–28.0)
Delivery systems: POSITIVE
Expiratory PAP: 6
FIO2: 0.4
Inspiratory PAP: 12
O2 SAT: 98.8 %
PATIENT TEMPERATURE: 37
pCO2 arterial: 48 mmHg (ref 32.0–48.0)
pH, Arterial: 7.36 (ref 7.350–7.450)
pO2, Arterial: 130 mmHg — ABNORMAL HIGH (ref 83.0–108.0)

## 2017-07-15 LAB — PHOSPHORUS: PHOSPHORUS: 2.9 mg/dL (ref 2.5–4.6)

## 2017-07-15 LAB — URINE DRUG SCREEN, QUALITATIVE (ARMC ONLY)
Amphetamines, Ur Screen: NOT DETECTED
BARBITURATES, UR SCREEN: POSITIVE — AB
BENZODIAZEPINE, UR SCRN: NOT DETECTED
CANNABINOID 50 NG, UR ~~LOC~~: NOT DETECTED
COCAINE METABOLITE, UR ~~LOC~~: NOT DETECTED
MDMA (Ecstasy)Ur Screen: NOT DETECTED
METHADONE SCREEN, URINE: NOT DETECTED
OPIATE, UR SCREEN: POSITIVE — AB
Phencyclidine (PCP) Ur S: NOT DETECTED
Tricyclic, Ur Screen: NOT DETECTED

## 2017-07-15 LAB — TROPONIN I
TROPONIN I: 1.28 ng/mL — AB (ref <0.03)
TROPONIN I: 1.08 ng/mL — AB (ref <0.03)

## 2017-07-15 LAB — MAGNESIUM: MAGNESIUM: 1.8 mg/dL (ref 1.7–2.4)

## 2017-07-15 MED ORDER — FUROSEMIDE 10 MG/ML IJ SOLN
40.0000 mg | Freq: Once | INTRAMUSCULAR | Status: AC
  Administered 2017-07-15: 40 mg via INTRAVENOUS
  Filled 2017-07-15: qty 4

## 2017-07-15 MED ORDER — BUDESONIDE 0.25 MG/2ML IN SUSP
0.2500 mg | Freq: Two times a day (BID) | RESPIRATORY_TRACT | Status: DC
  Administered 2017-07-15 – 2017-07-16 (×2): 0.25 mg via RESPIRATORY_TRACT
  Filled 2017-07-15 (×2): qty 2

## 2017-07-15 MED ORDER — IPRATROPIUM-ALBUTEROL 0.5-2.5 (3) MG/3ML IN SOLN
3.0000 mL | RESPIRATORY_TRACT | Status: DC
  Administered 2017-07-15 – 2017-07-19 (×25): 3 mL via RESPIRATORY_TRACT
  Filled 2017-07-15 (×25): qty 3

## 2017-07-15 MED ORDER — MORPHINE SULFATE (PF) 2 MG/ML IV SOLN
2.0000 mg | INTRAVENOUS | Status: DC | PRN
  Administered 2017-07-15: 4 mg via INTRAVENOUS
  Administered 2017-07-16 (×2): 2 mg via INTRAVENOUS
  Administered 2017-07-16 (×3): 4 mg via INTRAVENOUS
  Filled 2017-07-15 (×6): qty 2

## 2017-07-15 MED ORDER — METHYLPREDNISOLONE SODIUM SUCC 40 MG IJ SOLR
20.0000 mg | Freq: Two times a day (BID) | INTRAMUSCULAR | Status: DC
  Administered 2017-07-15 – 2017-07-18 (×8): 20 mg via INTRAVENOUS
  Filled 2017-07-15 (×8): qty 1

## 2017-07-15 NOTE — Progress Notes (Signed)
[facility] [place] NAME: Christina Hester    MR#:  [id number]  [place]:  1940/10/29  SUBJECTIVE:  Patient transferred to ICU yesterday due to increased work of breathing. Patient is on BiPAP Patient was extremely anxious and agitated as well as combative. She is on Precedex  REVIEW OF SYSTEMS:    Review of Systems  Unable to obtain patient is on Precedex and appears confused this morning    Tolerating Diet: Nothing by mouth     DRUG ALLERGIES:   Allergies  Allergen Reactions  . Aspirin Tinitus  . Prednisone Other (See Comments)    Makes violent  . Sulfa Antibiotics Nausea And Vomiting  . Symbicort [Budesonide-Formoterol Fumarate] Other (See Comments)    Patient felt "out of her mind" and angry.    VITALS:  Blood pressure (!) 118/59, pulse (!) 51, temperature 97.9 F (36.6 C), temperature source Axillary, resp. rate 15, height 5' (1.524 m), weight 41.6 kg (91 lb 11.4 oz), SpO2 96 %.  PHYSICAL EXAMINATION:  Constitutional: Appears thin and frail not in distress wearing BiPAP  HENT: Normocephalic. . Eyes: Conjunctivae and EOM are normal. PERRLA, no scleral icterus.  Neck: Normal ROM. Neck supple. No JVD. No tracheal deviation. CVS: RRR, S1/S2 +, no murmurs, no gallops, no carotid bruit.  Pulmonary bilateral wheezing with bilateral rhonchi no rales Abdominal: Soft. BS +,  no distension, tenderness, rebound or guarding.  Musculoskeletal: Moves all extremities. No edema and no tenderness.  Neuro: Sleepy this morning Skin: Skin is warm and dry. No rash noted. Psychiatric: Confused   LABORATORY PANEL:   CBC Recent Labs  Lab 07/14/17 0311  WBC 12.5*  HGB 11.6*  HCT 33.0*  PLT 239   ------------------------------------------------------------------------------------------------------------------  Chemistries  Recent Labs  Lab 07/14/17 0311  07/15/17 0358  NA 118*   < > 127*  K 3.9   < > 4.6  CL 86*   < > 98*   CO2 21*   < > 24  GLUCOSE 129*   < > 124*  BUN 10   < > 17  CREATININE 0.60   < > 0.52  CALCIUM 8.6*   < > 8.6*  MG  --   --  1.8  AST 30  --   --   ALT 16  --   --   ALKPHOS 81  --   --   BILITOT 0.8  --   --    < > = values in this interval not displayed.   ------------------------------------------------------------------------------------------------------------------  Cardiac Enzymes Recent Labs  Lab 07/14/17 1716 07/14/17 2316 07/15/17 0358  TROPONINI 1.07* 1.13* 1.28*   ------------------------------------------------------------------------------------------------------------------  RADIOLOGY:  Ct Angio Chest Pe W Or Wo Contrast  Result Date: 07/14/2017 CLINICAL DATA:  Acute hypoxic respiratory failure EXAM: CT ANGIOGRAPHY CHEST WITH CONTRAST TECHNIQUE: Multidetector CT imaging of the chest was performed using the standard protocol during bolus administration of intravenous contrast. Multiplanar CT image reconstructions and MIPs were obtained to evaluate the vascular anatomy. CONTRAST:  <See Chart> ISOVUE-370 IOPAMIDOL (ISOVUE-370) INJECTION 76% COMPARISON:  03/02/2017 FINDINGS: Cardiovascular: There are no filling defects in the pulmonary arterial tree to suggest acute pulmonary thromboembolism. There is no evidence of aortic aneurysm or dissection. Atherosclerotic calcification of the aortic arch and descending thoracic aorta are noted. There is calcified and smooth plaque just beyond the origin of the left subclavian artery causing 50% narrowing. Mild 3 vessel coronary artery calcifications. Mediastinum/Nodes: No abnormal mediastinal adenopathy. No  pericardial effusion. Lungs/Pleura: No pneumothorax or pleural effusion. Centrilobular emphysema. There is nodularity in the anterior right upper lobe, in the right middle lobe, and lingula, in the posterior left lower lobe, and in the central right lower lobe. Some of these areas have a tree-in-bud appearance. These findings  suggest centrilobular nodules with mucoid plugging of small airways. Upper Abdomen: No acute abnormality. Musculoskeletal: No acute rib fracture. No vertebral compression deformity. Review of the MIP images confirms the above findings. IMPRESSION: No evidence of acute pulmonary thromboembolism. Centrilobular nodules throughout the lungs with some areas of tree-in-bud appearance. Findings suggest an inflammatory process of small peripheral airways. 50% narrowing at the origin of the left subclavian artery Aortic Atherosclerosis (ICD10-I70.0) and Emphysema (ICD10-J43.9). Electronically Signed   By: Marybelle Killings M.D.   On: 07/14/2017 13:16   Dg Chest Portable 1 View  Result Date: 07/14/2017 CLINICAL DATA:  76 y/o  F; shortness of breath. EXAM: PORTABLE CHEST 1 VIEW COMPARISON:  03/02/2017 chest radiograph and chest CT FINDINGS: Stable normal cardiac silhouette. Aortic atherosclerosis with calcification. Clear lungs. No pleural effusion or pneumothorax. No acute osseous abnormality is evident. IMPRESSION: No active disease. Electronically Signed   By: Kristine Garbe M.D.   On: 07/14/2017 03:38     ASSESSMENT AND PLAN:   76 y/o female with CAD and COPD presents with SOB.  1. Acute hypoxic/hypercarbic resp failure with COPD exacerbation/PNA Continue BiPAP and weaned to nasal cannula as tolerated CT chest shows no pulmonary emboli Continue nebulized bronchodilators  Continue azithromycin and ceftriaxone  2. COPD acute exacerbation: IV steroids discontinued by intensivist however due to wheezing she would benefit from steroids. Continue nebulizer treatment  3. CAP: Continue rocephin and Azithromycin   4. Elevated troponin without chest pain with hx of CAD:  [facility] cardiology consult requeted   Continue ASA, Plavix,metoprolol  5 PVD/AAA/bilateral CAS Follows with vascular surgery AAA is stable 3.0 cm   6. Hyponatremia, acute on chronic: Sodium level has improved with IV  fluids Nephrology consultation appreciated    Management plans discussed with intensivist CODE STATUS: FULL  TOTAL TIME TAKING CARE OF THIS PATIENT: 28 minutes.     POSSIBLE D/C 2-4 days, DEPENDING ON CLINICAL CONDITION.   Beryle Bagsby M.D on 07/15/2017 at 8:49 AM  Between 7am to 6pm - Pager - [phone] After 6pm go to [email or web address] - password [email or web address] [facility]  Office  [phone]  CC: Primary care physician; Lavera Guise, MD  Note: This dictation was prepared with Dragon dictation along with smaller phrase technology. Any transcriptional errors that result from this process are unintentional.

## 2017-07-15 NOTE — Progress Notes (Signed)
[facility], [place] 07/15/17  Subjective:   Patient was transferred to the ICU yesterday for increased work of breathing and placed on noninvasive positive pressure ventilation.  Currently getting sedation with Precedex because of anxiety  Objective:  Vital signs in last 24 hours:  Temp:  [97.7 F (36.5 C)-98.8 F (37.1 C)] 97.9 F (36.6 C) (12/30 0800) Pulse Rate:  [38-114] 38 (12/30 1000) Resp:  [12-36] 15 (12/30 1000) BP: (69-182)/(37-131) 126/55 (12/30 1000) SpO2:  [87 %-100 %] 95 % (12/30 1000) FiO2 (%):  [30 %-40 %] 30 % (12/30 0800) Weight:  [41.2 kg (90 lb 13.3 oz)-41.6 kg (91 lb 11.4 oz)] 41.6 kg (91 lb 11.4 oz) (12/30 0430)  Weight change: 1.089 kg (2 lb 6.4 oz) Filed Weights   07/14/17 0930 07/14/17 1813 07/15/17 0430  Weight: 39.2 kg (86 lb 6.4 oz) 41.2 kg (90 lb 13.3 oz) 41.6 kg (91 lb 11.4 oz)    Intake/Output:    Intake/Output Summary (Last 24 hours) at 07/15/2017 1032 Last data filed at 07/15/2017 1000 Gross per 24 hour  Intake 2744.41 ml  Output 80 ml  Net 2664.41 ml     Physical Exam: General: NAD, laying in bed  HEENT Moist oral mucus membraned  Neck + JVD  Pulm/lungs B/l diffuse crackles and wheezing  CVS/Heart Regular, no rub  Abdomen:  soft  Extremities: No edema  Neurologic: sedated  Skin: No acute rashes   External cathter       Basic Metabolic Panel:  Recent Labs  Lab 07/14/17 0311 07/14/17 0645 07/15/17 0358  NA 118* 121* 127*  K 3.9 3.6 4.6  CL 86* 91* 98*  CO2 21* 21* 24  GLUCOSE 129* 144* 124*  BUN 10 9 17   CREATININE 0.60 0.67 0.52  CALCIUM 8.6* 8.4* 8.6*  MG  --   --  1.8  PHOS  --   --  2.9     CBC: Recent Labs  Lab 07/14/17 0311  WBC 12.5*  NEUTROABS 10.0*  HGB 11.6*  HCT 33.0*  MCV 89.1  PLT 239     No results found for: HEPBSAG, HEPBSAB, HEPBIGM    Microbiology:  Recent Results (from the past 240 hour(s))  MRSA PCR Screening     Status: None   Collection Time:  07/14/17  6:11 PM  Result Value Ref Range Status   MRSA by PCR NEGATIVE NEGATIVE Final    Comment:        The GeneXpert MRSA Assay (FDA approved for NASAL specimens only), is one component of a comprehensive MRSA colonization surveillance program. It is not intended to diagnose MRSA infection nor to guide or monitor treatment for MRSA infections. Performed at [facility], [address] [place], [place] [address]     Coagulation Studies: No results for input(s): LABPROT, INR in the last 72 hours.  Urinalysis: No results for input(s): COLORURINE, LABSPEC, PHURINE, GLUCOSEU, HGBUR, BILIRUBINUR, KETONESUR, PROTEINUR, UROBILINOGEN, NITRITE, LEUKOCYTESUR in the last 72 hours.  Invalid input(s): APPERANCEUR    Imaging: Ct Angio Chest Pe W Or Wo Contrast  Result Date: 07/14/2017 CLINICAL DATA:  Acute hypoxic respiratory failure EXAM: CT ANGIOGRAPHY CHEST WITH CONTRAST TECHNIQUE: Multidetector CT imaging of the chest was performed using the standard protocol during bolus administration of intravenous contrast. Multiplanar CT image reconstructions and MIPs were obtained to evaluate the vascular anatomy. CONTRAST:  <See Chart> ISOVUE-370 IOPAMIDOL (ISOVUE-370) INJECTION 76% COMPARISON:  03/02/2017 FINDINGS: Cardiovascular: There are no filling defects in the pulmonary  arterial tree to suggest acute pulmonary thromboembolism. There is no evidence of aortic aneurysm or dissection. Atherosclerotic calcification of the aortic arch and descending thoracic aorta are noted. There is calcified and smooth plaque just beyond the origin of the left subclavian artery causing 50% narrowing. Mild 3 vessel coronary artery calcifications. Mediastinum/Nodes: No abnormal mediastinal adenopathy. No pericardial effusion. Lungs/Pleura: No pneumothorax or pleural effusion. Centrilobular emphysema. There is nodularity in the anterior right upper lobe, in the right middle lobe, and lingula, in the posterior  left lower lobe, and in the central right lower lobe. Some of these areas have a tree-in-bud appearance. These findings suggest centrilobular nodules with mucoid plugging of small airways. Upper Abdomen: No acute abnormality. Musculoskeletal: No acute rib fracture. No vertebral compression deformity. Review of the MIP images confirms the above findings. IMPRESSION: No evidence of acute pulmonary thromboembolism. Centrilobular nodules throughout the lungs with some areas of tree-in-bud appearance. Findings suggest an inflammatory process of small peripheral airways. 50% narrowing at the origin of the left subclavian artery Aortic Atherosclerosis (ICD10-I70.0) and Emphysema (ICD10-J43.9). Electronically Signed   By: Marybelle Killings M.D.   On: 07/14/2017 13:16   Dg Chest Portable 1 View  Result Date: 07/14/2017 CLINICAL DATA:  76 y/o  F; shortness of breath. EXAM: PORTABLE CHEST 1 VIEW COMPARISON:  03/02/2017 chest radiograph and chest CT FINDINGS: Stable normal cardiac silhouette. Aortic atherosclerosis with calcification. Clear lungs. No pleural effusion or pneumothorax. No acute osseous abnormality is evident. IMPRESSION: No active disease. Electronically Signed   By: Kristine Garbe M.D.   On: 07/14/2017 03:38     Medications:   . sodium chloride 100 mL/hr at 07/15/17 1000  . azithromycin Stopped (07/14/17 1510)  . cefTRIAXone (ROCEPHIN)  IV Stopped (07/14/17 1438)  . dexmedetomidine (PRECEDEX) IV infusion 0.2 mcg/kg/hr (07/15/17 1000)   . amLODipine  5 mg Oral Daily  . benzonatate  100 mg Oral TID  . budesonide (PULMICORT) nebulizer solution  0.25 mg Nebulization BID  . buPROPion  100 mg Oral Daily  . chlorhexidine  15 mL Mouth Rinse BID  . clopidogrel  75 mg Oral QHS  . docusate sodium  100 mg Oral BID  . enoxaparin (LOVENOX) injection  30 mg Subcutaneous Q24H  . feeding supplement  1 Container Oral Daily  . ipratropium-albuterol  3 mL Nebulization Q6H  . ipratropium-albuterol  3  mL Nebulization Q4H  . losartan  100 mg Oral Daily  . mouth rinse  15 mL Mouth Rinse q12n4p  . methylPREDNISolone (SOLU-MEDROL) injection  20 mg Intravenous BID  . metoprolol tartrate  25 mg Oral BID  . tiotropium  18 mcg Inhalation q morning - 10a   acetaminophen **OR** acetaminophen, guaiFENesin-dextromethorphan, LORazepam, Melatonin, morphine injection, morphine injection, nitroGLYCERIN, ondansetron **OR** ondansetron (ZOFRAN) IV  Assessment/ Plan:  76 y.o.caucasian female with coronary artery disease, AAA, hypertension, COPD, was admitted on 07/14/2017 with COPD exacerbation and is found to have worsening hyponatremia  1.  Chronic hyponatremia with acute exacerbation Chronic hyponatremia may be related to underlying lung disease, "tea and toast" diet Thyroid function studies are normal We will obtain SPEP, UPEP, urine electrolytes D/c NS If resp status worsens, consider iv lasix. Monitor UOP closely  2. SOB  - likely combination of COPD exacerbation and Pulmonary edema      LOS: [place] 12/30/201810:32 AM  [facility] [place], [place]

## 2017-07-15 NOTE — Progress Notes (Signed)
Progress Note  Patient Name: Christina Hester Date of Encounter: 07/15/2017  Primary Cardiologist: [facility]  Subjective   Worsening respiratory status yesterday afternoon requiring BiPAP Unable to tolerate BiPAP, secondary to claustrophobia Started on Precedex, on nasal cannula Very lethargic this morning, expiratory wheezing  Inpatient Medications    Scheduled Meds: . amLODipine  5 mg Oral Daily  . benzonatate  100 mg Oral TID  . budesonide (PULMICORT) nebulizer solution  0.25 mg Nebulization BID  . buPROPion  100 mg Oral Daily  . chlorhexidine  15 mL Mouth Rinse BID  . clopidogrel  75 mg Oral QHS  . docusate sodium  100 mg Oral BID  . enoxaparin (LOVENOX) injection  30 mg Subcutaneous Q24H  . feeding supplement  1 Container Oral Daily  . furosemide  40 mg Intravenous Once  . ipratropium-albuterol  3 mL Nebulization Q6H  . ipratropium-albuterol  3 mL Nebulization Q4H  . losartan  100 mg Oral Daily  . mouth rinse  15 mL Mouth Rinse q12n4p  . methylPREDNISolone (SOLU-MEDROL) injection  20 mg Intravenous BID  . metoprolol tartrate  25 mg Oral BID  . tiotropium  18 mcg Inhalation q morning - 10a   Continuous Infusions: . azithromycin 500 mg (07/15/17 1248)  . cefTRIAXone (ROCEPHIN)  IV Stopped (07/14/17 1438)  . dexmedetomidine (PRECEDEX) IV infusion Stopped (07/15/17 1040)   PRN Meds: acetaminophen **OR** acetaminophen, guaiFENesin-dextromethorphan, LORazepam, Melatonin, morphine injection, morphine injection, nitroGLYCERIN, ondansetron **OR** ondansetron (ZOFRAN) IV   Vital Signs    Vitals:   07/15/17 0905 07/15/17 1000 07/15/17 1100 07/15/17 1200  BP: (!) 155/81 (!) 126/55 (!) 116/57 (!) 153/81  Pulse: 81 (!) 38 (!) 55 77  Resp: (!) 23 15 13 17   Temp:    98 F (36.7 C)  TempSrc:    Axillary  SpO2: 94% 95% 100% 99%  Weight:      Height:        Intake/Output Summary (Last 24 hours) at 07/15/2017 1323 Last data filed at 07/15/2017 1040 Gross per 24 hour    Intake 2812.41 ml  Output 80 ml  Net 2732.41 ml   Filed Weights   07/14/17 0930 07/14/17 1813 07/15/17 0430  Weight: 86 lb 6.4 oz (39.2 kg) 90 lb 13.3 oz (41.2 kg) 91 lb 11.4 oz (41.6 kg)    Telemetry    Normal sinus rhythm- Personally Reviewed  ECG     Cardiac studies Cardiac catheterization March 05, 2017   The left ventricular systolic function is normal.  LV end diastolic pressure is mildly elevated.  The left ventricular ejection fraction is 55-65% by visual estimate.  Prox RCA lesion, 95 %stenosed.  Mid RCA lesion, 100 %stenosed.  Ost 2nd Mrg to 2nd Mrg lesion, 50 %stenosed.  1. Severe one-vessel coronary artery disease with chronically occluded right coronary artery with well-developed left-to-right collaterals. Mild to moderate nonobstructive disease affecting the left system with extremely tortuous coronary arteries. 2. Normal LV systolic function and mildly elevated left ventricular end-diastolic pressure.    CT chest IMPRESSION: No evidence of acute pulmonary thromboembolism. Centrilobular nodules throughout the lungs with some areas of tree-in-bud appearance. Findings suggest an inflammatory process of small peripheral airways. 50% narrowing at the origin of the left subclavian artery Aortic Atherosclerosis      Physical Exam   GEN: No acute distress.   Neck: No JVD Cardiac: RRR, no murmurs, rubs, or gallops.  Respiratory: Clear to auscultation bilaterally. GI: Soft, nontender, non-distended  MS: No edema; No deformity.  Neuro:  Nonfocal  Psych: Normal affect   Labs    Chemistry Recent Labs  Lab 07/14/17 0311 07/14/17 0645 07/15/17 0358  NA 118* 121* 127*  K 3.9 3.6 4.6  CL 86* 91* 98*  CO2 21* 21* 24  GLUCOSE 129* 144* 124*  BUN 10 9 17   CREATININE 0.60 0.67 0.52  CALCIUM 8.6* 8.4* 8.6*  PROT 7.0  --   --   ALBUMIN 3.6  --   --   AST 30  --   --   ALT 16  --   --   ALKPHOS 81  --   --   BILITOT 0.8  --   --   GFRNONAA >60  >60 >60  GFRAA >60 >60 >60  ANIONGAP 11 9 5      Hematology Recent Labs  Lab 07/14/17 0311  WBC 12.5*  RBC 3.71*  HGB 11.6*  HCT 33.0*  MCV 89.1  MCH 31.3  MCHC 35.1  RDW 12.7  PLT 239    Cardiac Enzymes Recent Labs  Lab 07/14/17 1125 07/14/17 1716 07/14/17 2316 07/15/17 0358  TROPONINI 0.68* 1.07* 1.13* 1.28*   No results for input(s): TROPIPOC in the last 168 hours.   BNP Recent Labs  Lab 07/14/17 0311  BNP 195.0*     DDimer No results for input(s): DDIMER in the last 168 hours.   Radiology    Ct Angio Chest Pe W Or Wo Contrast  Result Date: 07/14/2017 CLINICAL DATA:  Acute hypoxic respiratory failure EXAM: CT ANGIOGRAPHY CHEST WITH CONTRAST TECHNIQUE: Multidetector CT imaging of the chest was performed using the standard protocol during bolus administration of intravenous contrast. Multiplanar CT image reconstructions and MIPs were obtained to evaluate the vascular anatomy. CONTRAST:  <See Chart> ISOVUE-370 IOPAMIDOL (ISOVUE-370) INJECTION 76% COMPARISON:  03/02/2017 FINDINGS: Cardiovascular: There are no filling defects in the pulmonary arterial tree to suggest acute pulmonary thromboembolism. There is no evidence of aortic aneurysm or dissection. Atherosclerotic calcification of the aortic arch and descending thoracic aorta are noted. There is calcified and smooth plaque just beyond the origin of the left subclavian artery causing 50% narrowing. Mild 3 vessel coronary artery calcifications. Mediastinum/Nodes: No abnormal mediastinal adenopathy. No pericardial effusion. Lungs/Pleura: No pneumothorax or pleural effusion. Centrilobular emphysema. There is nodularity in the anterior right upper lobe, in the right middle lobe, and lingula, in the posterior left lower lobe, and in the central right lower lobe. Some of these areas have a tree-in-bud appearance. These findings suggest centrilobular nodules with mucoid plugging of small airways. Upper Abdomen: No acute  abnormality. Musculoskeletal: No acute rib fracture. No vertebral compression deformity. Review of the MIP images confirms the above findings. IMPRESSION: No evidence of acute pulmonary thromboembolism. Centrilobular nodules throughout the lungs with some areas of tree-in-bud appearance. Findings suggest an inflammatory process of small peripheral airways. 50% narrowing at the origin of the left subclavian artery Aortic Atherosclerosis (ICD10-I70.0) and Emphysema (ICD10-J43.9). Electronically Signed   By: Marybelle Killings M.D.   On: 07/14/2017 13:16   Dg Chest Portable 1 View  Result Date: 07/14/2017 CLINICAL DATA:  76 y/o  F; shortness of breath. EXAM: PORTABLE CHEST 1 VIEW COMPARISON:  03/02/2017 chest radiograph and chest CT FINDINGS: Stable normal cardiac silhouette. Aortic atherosclerosis with calcification. Clear lungs. No pleural effusion or pneumothorax. No acute osseous abnormality is evident. IMPRESSION: No active disease. Electronically Signed   By: Kristine Garbe M.D.   On: 07/14/2017 03:38    Cardiac Studies     Patient  Profile     Christina Hester is a 76 y.o. female with a hx of  Coronary artery disease, occluded mid RCA with collaterals from left to right by catheterization March 05, 2017 Long history of smoking, COPD Prior COPD exacerbation Hypertension Chronic chest pain Who presents with worsening cough, shortness of breath Cardiology consulted for elevated troponin    Assessment & Plan     1. demand ischemia Elevated troponin in the setting of COPD exacerbation and known coronary artery disease,  This is not a non-STEMI Continue outpatient medications, aspirin, Plavix, beta-blocker  Aggressive COPD/pulmonary management  2) acute respiratory distress Acute COPD exacerbation  Unable to tolerate BiPAP CT scan chest reviewed showing emphysema, no PE Long history of smoking On steroids, nebulizers, antibiotics broad-spectrum Concern for  hypercapnia  3) CAD previous inferior wall MI,  Prior cardiac catheterization August 2018 with occluded mid RCA Collaterals from left to right, medical management recommended at that time  4) Hyponatremia Appears to be chronically low Worse on presentation, 118 Now up to 127 Agree with recommendation for free water restriction   Total encounter time more than 25 minutes  Greater than 50% was spent in counseling and coordination of care with the patient   For questions or updates, please contact [facility] Please consult [email or web address] for contact info under Cardiology/STEMI.      Signed, Ida Rogue, MD  07/15/2017, 1:23 PM

## 2017-07-15 NOTE — Plan of Care (Signed)
VSS O/N. Pt. SB on precedex in 50's, BP stable. Pt. Becomes extremely anxious, agitated and combative when stimulated while on Bipap.  Family reports pt. Is very claustrophobic. Pt. Received morphine PRN x 3 and did rest intermittently for a total of 7 hours overnight.  Bipap titrated to 30 %, A & O x 3, following commands. No care concerns at this time.

## 2017-07-16 DIAGNOSIS — J9601 Acute respiratory failure with hypoxia: Secondary | ICD-10-CM

## 2017-07-16 LAB — BASIC METABOLIC PANEL
ANION GAP: 9 (ref 5–15)
BUN: 28 mg/dL — AB (ref 6–20)
CHLORIDE: 93 mmol/L — AB (ref 101–111)
CO2: 27 mmol/L (ref 22–32)
Calcium: 8.5 mg/dL — ABNORMAL LOW (ref 8.9–10.3)
Creatinine, Ser: 0.72 mg/dL (ref 0.44–1.00)
GFR calc Af Amer: >60 mL/min (ref >60)
GFR calc non Af Amer: >60 mL/min (ref >60)
GLUCOSE: 106 mg/dL — AB (ref 65–99)
POTASSIUM: 4.2 mmol/L (ref 3.5–5.1)
Sodium: 129 mmol/L — ABNORMAL LOW (ref 135–145)

## 2017-07-16 LAB — CBC
HEMATOCRIT: 30.4 % — AB (ref 35.0–47.0)
HEMOGLOBIN: 10.4 g/dL — AB (ref 12.0–16.0)
MCH: 31.3 pg (ref 26.0–34.0)
MCHC: 34.3 g/dL (ref 32.0–36.0)
MCV: 91.1 fL (ref 80.0–100.0)
PLATELETS: 241 10*3/uL (ref 150–440)
RBC: 3.34 MIL/uL — AB (ref 3.80–5.20)
RDW: 12.9 % (ref 11.5–14.5)
WBC: 14 10*3/uL — ABNORMAL HIGH (ref 3.6–11.0)

## 2017-07-16 LAB — TROPONIN I
TROPONIN I: 0.91 ng/mL — AB (ref <0.03)
Troponin I: 1.15 ng/mL (ref <0.03)

## 2017-07-16 LAB — MAGNESIUM: Magnesium: 1.9 mg/dL (ref 1.7–2.4)

## 2017-07-16 LAB — PROCALCITONIN: Procalcitonin: 1.92 ng/mL

## 2017-07-16 MED ORDER — POTASSIUM CHLORIDE CRYS ER 20 MEQ PO TBCR
20.0000 meq | EXTENDED_RELEASE_TABLET | Freq: Once | ORAL | Status: AC
  Administered 2017-07-16: 20 meq via ORAL
  Filled 2017-07-16: qty 1

## 2017-07-16 MED ORDER — FUROSEMIDE 10 MG/ML IJ SOLN
80.0000 mg | Freq: Once | INTRAMUSCULAR | Status: AC
  Administered 2017-07-16: 80 mg via INTRAVENOUS

## 2017-07-16 MED ORDER — BUDESONIDE 0.5 MG/2ML IN SUSP
0.5000 mg | Freq: Two times a day (BID) | RESPIRATORY_TRACT | Status: DC
  Administered 2017-07-16 – 2017-07-19 (×6): 0.5 mg via RESPIRATORY_TRACT
  Filled 2017-07-16 (×6): qty 2

## 2017-07-16 MED ORDER — DEXTROSE 5 % IV SOLN
1.0000 g | INTRAVENOUS | Status: DC
  Administered 2017-07-16 – 2017-07-18 (×3): 1 g via INTRAVENOUS
  Filled 2017-07-16 (×4): qty 10

## 2017-07-16 MED ORDER — AZITHROMYCIN 500 MG IV SOLR
500.0000 mg | INTRAVENOUS | Status: DC
  Administered 2017-07-16: 500 mg via INTRAVENOUS
  Filled 2017-07-16: qty 500

## 2017-07-16 MED ORDER — FUROSEMIDE 10 MG/ML IJ SOLN: 80.0000 mg | Freq: Once | INTRAMUSCULAR | Status: AC

## 2017-07-16 MED ORDER — FUROSEMIDE 10 MG/ML IJ SOLN
INTRAMUSCULAR | Status: AC
  Filled 2017-07-16: qty 8

## 2017-07-16 MED ORDER — AZITHROMYCIN 500 MG PO TABS
500.0000 mg | ORAL_TABLET | Freq: Every day | ORAL | Status: AC
  Administered 2017-07-17 – 2017-07-18 (×2): 500 mg via ORAL
  Filled 2017-07-16 (×2): qty 1

## 2017-07-16 MED ORDER — ALBUTEROL SULFATE (2.5 MG/3ML) 0.083% IN NEBU
2.5000 mg | INHALATION_SOLUTION | RESPIRATORY_TRACT | Status: DC | PRN
  Administered 2017-07-16: 2.5 mg via RESPIRATORY_TRACT
  Filled 2017-07-16: qty 3

## 2017-07-16 MED ORDER — TIOTROPIUM BROMIDE MONOHYDRATE 18 MCG IN CAPS
18.0000 ug | ORAL_CAPSULE | Freq: Every morning | RESPIRATORY_TRACT | Status: DC
  Administered 2017-07-18 – 2017-07-19 (×2): 18 ug via RESPIRATORY_TRACT
  Filled 2017-07-16: qty 5

## 2017-07-16 NOTE — Progress Notes (Signed)
[place], [place] 07/16/17  Subjective:   Patient remains critically ill.  She is again acutely short of breath and is requiring noninvasive positive pressure ventilation.  Objective:  Vital signs in last 24 hours:  Temp:  [97.6 F (36.4 C)-98.1 F (36.7 C)] 97.9 F (36.6 C) (12/31 0700) Pulse Rate:  [55-130] 89 (12/31 0927) Resp:  [11-31] 22 (12/31 0900) BP: (82-153)/(42-127) 138/71 (12/31 0927) SpO2:  [88 %-100 %] 98 % (12/31 0900) FiO2 (%):  [28 %-45 %] 45 % (12/31 1000) Weight:  [41.1 kg (90 lb 9.7 oz)] 41.1 kg (90 lb 9.7 oz) (12/31 0500)  Weight change: 1.909 kg (4 lb 3.3 oz) Filed Weights   07/14/17 1813 07/15/17 0430 07/16/17 0500  Weight: 41.2 kg (90 lb 13.3 oz) 41.6 kg (91 lb 11.4 oz) 41.1 kg (90 lb 9.7 oz)    Intake/Output:    Intake/Output Summary (Last 24 hours) at 07/16/2017 1026 Last data filed at 07/16/2017 0402 Gross per 24 hour  Intake 421.51 ml  Output 380 ml  Net 41.51 ml     Physical Exam: General: NAD, sitting up in bed  HEENT Moist oral mucus membranes, BiPAP mask in place  Neck + JVD  Pulm/lungs B/l diffuse crackles and wheezing  CVS/Heart Regular, no rub  Abdomen:  soft  Extremities: No edema  Neurologic: sedated  Skin: No acute rashes          Basic Metabolic Panel:  Recent Labs  Lab 07/14/17 0311 07/14/17 0645 07/15/17 0358 07/16/17 0124  NA 118* 121* 127* 129*  K 3.9 3.6 4.6 4.2  CL 86* 91* 98* 93*  CO2 21* 21* 24 27  GLUCOSE 129* 144* 124* 106*  BUN 10 9 17  28*  CREATININE 0.60 0.67 0.52 0.72  CALCIUM 8.6* 8.4* 8.6* 8.5*  MG  --   --  1.8 1.9  PHOS  --   --  2.9  --      CBC: Recent Labs  Lab 07/14/17 0311 07/16/17 0124  WBC 12.5* 14.0*  NEUTROABS 10.0*  --   HGB 11.6* 10.4*  HCT 33.0* 30.4*  MCV 89.1 91.1  PLT 239 241     No results found for: HEPBSAG, HEPBSAB, HEPBIGM    Microbiology:  Recent Results (from the past 240 hour(s))  MRSA PCR Screening     Status: None    Collection Time: 07/14/17  6:11 PM  Result Value Ref Range Status   MRSA by PCR NEGATIVE NEGATIVE Final    Comment:        The GeneXpert MRSA Assay (FDA approved for NASAL specimens only), is one component of a comprehensive MRSA colonization surveillance program. It is not intended to diagnose MRSA infection nor to guide or monitor treatment for MRSA infections. Performed at [facility], [address] [place], [place] [address]     Coagulation Studies: No results for input(s): LABPROT, INR in the last 72 hours.  Urinalysis: No results for input(s): COLORURINE, LABSPEC, PHURINE, GLUCOSEU, HGBUR, BILIRUBINUR, KETONESUR, PROTEINUR, UROBILINOGEN, NITRITE, LEUKOCYTESUR in the last 72 hours.  Invalid input(s): APPERANCEUR    Imaging: Ct Angio Chest Pe W Or Wo Contrast  Result Date: 07/14/2017 CLINICAL DATA:  Acute hypoxic respiratory failure EXAM: CT ANGIOGRAPHY CHEST WITH CONTRAST TECHNIQUE: Multidetector CT imaging of the chest was performed using the standard protocol during bolus administration of intravenous contrast. Multiplanar CT image reconstructions and MIPs were obtained to evaluate the vascular anatomy. CONTRAST:  <See Chart> ISOVUE-370 IOPAMIDOL (ISOVUE-370) INJECTION  76% COMPARISON:  03/02/2017 FINDINGS: Cardiovascular: There are no filling defects in the pulmonary arterial tree to suggest acute pulmonary thromboembolism. There is no evidence of aortic aneurysm or dissection. Atherosclerotic calcification of the aortic arch and descending thoracic aorta are noted. There is calcified and smooth plaque just beyond the origin of the left subclavian artery causing 50% narrowing. Mild 3 vessel coronary artery calcifications. Mediastinum/Nodes: No abnormal mediastinal adenopathy. No pericardial effusion. Lungs/Pleura: No pneumothorax or pleural effusion. Centrilobular emphysema. There is nodularity in the anterior right upper lobe, in the right middle lobe, and  lingula, in the posterior left lower lobe, and in the central right lower lobe. Some of these areas have a tree-in-bud appearance. These findings suggest centrilobular nodules with mucoid plugging of small airways. Upper Abdomen: No acute abnormality. Musculoskeletal: No acute rib fracture. No vertebral compression deformity. Review of the MIP images confirms the above findings. IMPRESSION: No evidence of acute pulmonary thromboembolism. Centrilobular nodules throughout the lungs with some areas of tree-in-bud appearance. Findings suggest an inflammatory process of small peripheral airways. 50% narrowing at the origin of the left subclavian artery Aortic Atherosclerosis (ICD10-I70.0) and Emphysema (ICD10-J43.9). Electronically Signed   By: Marybelle Killings M.D.   On: 07/14/2017 13:16     Medications:   . azithromycin 500 mg (07/16/17 0937)  . cefTRIAXone (ROCEPHIN)  IV    . dexmedetomidine (PRECEDEX) IV infusion Stopped (07/16/17 0402)   . furosemide      . amLODipine  5 mg Oral Daily  . benzonatate  100 mg Oral TID  . budesonide (PULMICORT) nebulizer solution  0.5 mg Nebulization BID  . buPROPion  100 mg Oral Daily  . chlorhexidine  15 mL Mouth Rinse BID  . clopidogrel  75 mg Oral QHS  . docusate sodium  100 mg Oral BID  . enoxaparin (LOVENOX) injection  30 mg Subcutaneous Q24H  . feeding supplement  1 Container Oral Daily  . furosemide  80 mg Intravenous Once  . ipratropium-albuterol  3 mL Nebulization Q4H  . losartan  100 mg Oral Daily  . mouth rinse  15 mL Mouth Rinse q12n4p  . methylPREDNISolone (SOLU-MEDROL) injection  20 mg Intravenous BID  . metoprolol tartrate  25 mg Oral BID  . potassium chloride  20 mEq Oral Once  . [START ON 07/18/2017] tiotropium  18 mcg Inhalation q morning - 10a   acetaminophen **OR** acetaminophen, guaiFENesin-dextromethorphan, LORazepam, Melatonin, morphine injection, nitroGLYCERIN, ondansetron **OR** ondansetron (ZOFRAN) IV  Assessment/ Plan:  76  y.o.caucasian female with coronary artery disease, AAA, hypertension, COPD, was admitted on 07/14/2017 with COPD exacerbation and is found to have worsening hyponatremia  1.  Chronic hyponatremia with acute exacerbation Chronic hyponatremia may be related to underlying lung disease, "tea and toast" diet Thyroid function studies are normal SPEP and UPEP are pending  2. SOB  - likely combination of COPD exacerbation and acute pulmonary edema -Given 1 dose of IV Lasix 80 mg -We will repeat the dose as necessary.      LOS: [place] 12/31/201810:26 [place], [place]

## 2017-07-16 NOTE — Plan of Care (Signed)
VSS O/N, BP soft intermittently with precedex gtt- turned off @ 0400. Pt.'s tolerance of Bipap (on @ 2000) improved from last night. Pt. Does well with 4 mg morphine PRN & lots of emotional support to get through anxiety and panic attacks. Pt. Denies pain, A&O x 3, following commands. No care concerns at this time.

## 2017-07-16 NOTE — Consult Note (Signed)
PULMONARY / CRITICAL CARE MEDICINE  Name: COLBY CATANESE MRN: [id number] DOB: 03-31-1941    ADMISSION DATE:  07/14/2017   CONSULTATION DATE:  07/14/2017  REFERRING MD:  Dr. Benjie Karvonen  REASON: Acute respiratory failure  HISTORY OF PRESENT ILLNESS:  Patient remains SOB Increased WOB On bIPAP Severe resp distress Alert and awake  REVIEW OF SYSTEMS:   Unable to obtain as patient is on continuous BiPAP   VITAL SIGNS: BP (!) 126/58   Pulse 74   Temp 97.7 F (36.5 C) (Axillary)   Resp 13   Ht 5' (1.524 m)   Wt 90 lb 9.7 oz (41.1 kg)   SpO2 98%   BMI 17.70 kg/m      VENTILATOR SETTINGS: FiO2 (%):  [28 %-45 %] 40 %  INTAKE / OUTPUT: I/O last 3 completed shifts: In: 1980.5 [I.V.:1680.5; IV Piggyback:300] Out: 70 [Urine:490]  PHYSICAL EXAMINATION: General: Cachectic, in moderate respiratory distress Neuro: Alert and oriented x3 moves all extremities follows commands HEENT: PERRLA, trachea midline, no JVD Cardiovascular: Pulse regular, S1-S2, no murmur regurg or gallop, no edema, +2 pulses Lungs: Increased work of breathing, short expiratory span, bilateral expiratory wheezes, breath sounds diminished in the bases Abdomen: Nondistended, normal bowel sounds in all 4 quadrants Musculoskeletal: No deformities, positive range of motion  skin: Warm and dry  LABS:  BMET Recent Labs  Lab 07/14/17 0645 07/15/17 0358 07/16/17 0124  NA 121* 127* 129*  K 3.6 4.6 4.2  CL 91* 98* 93*  CO2 21* 24 27  BUN 9 17 28*  CREATININE 0.67 0.52 0.72  GLUCOSE 144* 124* 106*    Electrolytes Recent Labs  Lab 07/14/17 0645 07/15/17 0358 07/16/17 0124  CALCIUM 8.4* 8.6* 8.5*  MG  --  1.8 1.9  PHOS  --  2.9  --     CBC Recent Labs  Lab 07/14/17 0311 07/16/17 0124  WBC 12.5* 14.0*  HGB 11.6* 10.4*  HCT 33.0* 30.4*  PLT 239 241    Coag's No results for input(s): APTT, INR in the last 168 hours.  Sepsis Markers Recent Labs  Lab 07/15/17 0358 07/16/17 0124   PROCALCITON 1.46 1.92    ABG Recent Labs  Lab 07/14/17 1722 07/14/17 1924 07/15/17 2009  PHART 7.23* 7.27* 7.36  PCO2ART 60* 52* 48  PO2ART 71* 126* 130*    Liver Enzymes Recent Labs  Lab 07/14/17 0311  AST 30  ALT 16  ALKPHOS 81  BILITOT 0.8  ALBUMIN 3.6    Cardiac Enzymes Recent Labs  Lab 07/15/17 0358 07/15/17 2038 07/16/17 0124  TROPONINI 1.28* 1.08* 1.15*   STUDIES:  Last echo was in September 2018: LV EF: 55% -   60% CULTURES: None  ANTIBIOTICS: Azithromycin Ceftriaxone  SIGNIFICANT EVENTS: 07/14/2017: Admitted  LINES/TUBES: Peripheral IV  ASSESSMENT patient admitted for severe and Acute hypoxic/hypercarbic respiratory failure on biPAP Acute COPD exacerbation Probable community-acquired pneumonia Current tobacco use History of hypertension, hyperlipidemia, coronary artery disease, and peripheral artery disease  PLAN Hemodynamic monitoring per ICU protocol Continuous BiPAP and titrate to nasal cannula as tolerated High risk for intubation As needed morphine and Precedex for air hunger Nebulized bronchodilators Nebulized steroids Continue IV steroids GI and DVT prophylaxis   Critical Care Time devoted to patient care services described in this note is 40 minutes.   Overall, patient is critically ill, prognosis is guarded.  Patient with Multiorgan failure and at high risk for cardiac arrest and death.    Corrin Parker, M.D.  [facility]  Medical Director [place] Director [facility] Cardio-Pulmonary Department

## 2017-07-16 NOTE — Progress Notes (Signed)
[place] at [place] NAME: Christina Hester    MR#:  [id number]  [place]:  April 12, 1941  SUBJECTIVE:  Patient remains on BiPAP REVIEW OF SYSTEMS:    Review of Systems  Unable to obtain patient is on Precedex confused   Tolerating Diet: yes   DRUG ALLERGIES:   Allergies  Allergen Reactions  . Aspirin Tinitus  . Prednisone Other (See Comments)    Makes violent  . Sulfa Antibiotics Nausea And Vomiting  . Symbicort [Budesonide-Formoterol Fumarate] Other (See Comments)    Patient felt "out of her mind" and angry.    VITALS:  Blood pressure 128/62, pulse 74, temperature 97.9 F (36.6 C), temperature source Axillary, resp. rate (!) 22, height 5' (1.524 m), weight 41.1 kg (90 lb 9.7 oz), SpO2 96 %.  PHYSICAL EXAMINATION:  Constitutional: Appears thin and frail not in distress wearing BiPAP  HENT: Normocephalic. . Eyes: Conjunctivae and EOM are normal. PERRLA, no scleral icterus.  Neck: Normal ROM. Neck supple. No JVD. No tracheal deviation. CVS: RRR, S1/S2 +, no murmurs, no gallops, no carotid bruit.  Pulmonary bilateral wheezing and rales  Abdominal: Soft. BS +,  no distension, tenderness, rebound or guarding.  Musculoskeletal: Moves all extremities. No edema and no tenderness.  Neuro: Sitting up wearing BiPAP  Skin: Skin is warm and dry. No rash noted. Psychiatric: Confused   LABORATORY PANEL:   CBC Recent Labs  Lab 07/16/17 0124  WBC 14.0*  HGB 10.4*  HCT 30.4*  PLT 241   ------------------------------------------------------------------------------------------------------------------  Chemistries  Recent Labs  Lab 07/14/17 0311  07/16/17 0124  NA 118*   < > 129*  K 3.9   < > 4.2  CL 86*   < > 93*  CO2 21*   < > 27  GLUCOSE 129*   < > 106*  BUN 10   < > 28*  CREATININE 0.60   < > 0.72  CALCIUM 8.6*   < > 8.5*  MG  --    < > 1.9  AST 30  --   --   ALT 16  --   --   ALKPHOS 81  --   --   BILITOT 0.8  --    --    < > = values in this interval not displayed.   ------------------------------------------------------------------------------------------------------------------  Cardiac Enzymes Recent Labs  Lab 07/15/17 0358 07/15/17 2038 07/16/17 0124  TROPONINI 1.28* 1.08* 1.15*   ------------------------------------------------------------------------------------------------------------------  RADIOLOGY:  Ct Angio Chest Pe W Or Wo Contrast  Result Date: 07/14/2017 CLINICAL DATA:  Acute hypoxic respiratory failure EXAM: CT ANGIOGRAPHY CHEST WITH CONTRAST TECHNIQUE: Multidetector CT imaging of the chest was performed using the standard protocol during bolus administration of intravenous contrast. Multiplanar CT image reconstructions and MIPs were obtained to evaluate the vascular anatomy. CONTRAST:  <See Chart> ISOVUE-370 IOPAMIDOL (ISOVUE-370) INJECTION 76% COMPARISON:  03/02/2017 FINDINGS: Cardiovascular: There are no filling defects in the pulmonary arterial tree to suggest acute pulmonary thromboembolism. There is no evidence of aortic aneurysm or dissection. Atherosclerotic calcification of the aortic arch and descending thoracic aorta are noted. There is calcified and smooth plaque just beyond the origin of the left subclavian artery causing 50% narrowing. Mild 3 vessel coronary artery calcifications. Mediastinum/Nodes: No abnormal mediastinal adenopathy. No pericardial effusion. Lungs/Pleura: No pneumothorax or pleural effusion. Centrilobular emphysema. There is nodularity in the anterior right upper lobe, in the right middle lobe, and lingula, in the posterior left lower lobe, and in the  central right lower lobe. Some of these areas have a tree-in-bud appearance. These findings suggest centrilobular nodules with mucoid plugging of small airways. Upper Abdomen: No acute abnormality. Musculoskeletal: No acute rib fracture. No vertebral compression deformity. Review of the MIP images confirms the  above findings. IMPRESSION: No evidence of acute pulmonary thromboembolism. Centrilobular nodules throughout the lungs with some areas of tree-in-bud appearance. Findings suggest an inflammatory process of small peripheral airways. 50% narrowing at the origin of the left subclavian artery Aortic Atherosclerosis (ICD10-I70.0) and Emphysema (ICD10-J43.9). Electronically Signed   By: Marybelle Killings M.D.   On: 07/14/2017 13:16     ASSESSMENT AND PLAN:   76 y/o female with CAD and COPD presents with SOB.  1. Acute hypoxic/hypercarbic resp failure with COPD exacerbation/PNA Continue BiPAP and wean to nasal cannula as tolerated CT chest shows no pulmonary emboli Continue nebulized bronchodilators  Continue azithromycin and ceftriaxone  2. COPD acute exacerbation: IV steroids discontinued by intensivist however due to wheezing she would benefit from steroids. Continue nebulizer treatment  3. CAP: Continue rocephin and Azithromycin   4. Elevated troponin without chest pain with hx of CAD:  [facility] cardiology consultation appreciated . Is due to demand ischemia. Patient is ruled out for non-STEMI  Continue ASA, Plavix,metoprolol  5 PVD/AAA/bilateral CAS Follows with vascular surgery AAA is stable 3.0 cm   6. Hyponatremia, acute on chronic: Sodium level has improved with IV fluids Nephrology consultation appreciated Sodium 129 this morning   Management plans discussed with intensivist CODE STATUS: FULL  TOTAL TIME TAKING CARE OF THIS PATIENT: 24 minutes.     POSSIBLE D/C 2-4 days, DEPENDING ON CLINICAL CONDITION.   Dashaun Onstott M.D on 07/16/2017 at 9:06 AM  Between 7am to 6pm - Pager - [phone] After 6pm go to [email or web address] - password [email or web address] [place] [facility]  Office  [phone]  CC: Primary care physician; Lavera Guise, MD  Note: This dictation was prepared with Dragon dictation along with smaller phrase technology. Any transcriptional errors that result  from this process are unintentional.

## 2017-07-16 NOTE — Progress Notes (Signed)
Pt off BIPAP and on HFNC from 0845 to 1025.  At 1025  She became panicky, tachypneic,  Lungs with wheezing and tightness auscultated, BP elevated and ST on monitor. Sats remained greater than 91%. Morphine 4mg  IV. BIPAP replaced by RT. 80mg  iv lasix ordered and given. She relaxed with nurse at bedside after 46min.

## 2017-07-16 NOTE — Progress Notes (Signed)
Pharmacy Antibiotic Note  Christina Hester is a 76 y.o. female admitted on 07/14/2017 with CAP. Pharmacy has been consulted for ceftriaxone dosing. Patient is also on day 3 of azithromycin.   Plan: Continue ceftriaxone 1g IV Q24hr for total of 7 days of therapy.   Will continue azithromycin 500mg  PO Q24hr for total of 5 days of therapy.   Height: 5' (152.4 cm) Weight: 90 lb 9.7 oz (41.1 kg) IBW/kg (Calculated) : 45.5  Temp (24hrs), Avg:98 F (36.7 C), Min:97.7 F (36.5 C), Max:98.2 F (36.8 C)  Recent Labs  Lab 07/14/17 0311 07/14/17 0645 07/15/17 0358 07/16/17 0124  WBC 12.5*  --   --  14.0*  CREATININE 0.60 0.67 0.52 0.72    Estimated Creatinine Clearance: 39.4 mL/min (by C-G formula based on SCr of 0.72 mg/dL).    Allergies  Allergen Reactions  . Aspirin Tinitus  . Prednisone Other (See Comments)    Makes violent  . Sulfa Antibiotics Nausea And Vomiting  . Symbicort [Budesonide-Formoterol Fumarate] Other (See Comments)    Patient felt "out of her mind" and angry.    Antimicrobials this admission: Azithromycin 12/29 >> 1/2 Ceftriaxone 12/29 >> 1/4  Dose adjustments this admission: 12/31 Azithromycin transitioned to PO   Microbiology results: 12/29 MRSA PCR: negative.   Thank you for allowing pharmacy to be a part of this patient's care.  Simpson,Michael L 07/16/2017 10:50 PM

## 2017-07-17 LAB — BASIC METABOLIC PANEL
Anion gap: 9 (ref 5–15)
BUN: 20 mg/dL (ref 6–20)
CALCIUM: 9.1 mg/dL (ref 8.9–10.3)
CO2: 31 mmol/L (ref 22–32)
CREATININE: 0.54 mg/dL (ref 0.44–1.00)
Chloride: 89 mmol/L — ABNORMAL LOW (ref 101–111)
GFR calc non Af Amer: >60 mL/min (ref >60)
Glucose, Bld: 115 mg/dL — ABNORMAL HIGH (ref 65–99)
Potassium: 4.4 mmol/L (ref 3.5–5.1)
SODIUM: 129 mmol/L — AB (ref 135–145)

## 2017-07-17 LAB — PROCALCITONIN: Procalcitonin: 0.72 ng/mL

## 2017-07-17 NOTE — Progress Notes (Signed)
[place] at [place] NAME: Christina Hester    MR#:  [id number]  DATE OF BIRTH:  1941/06/01  SUBJECTIVE:   Family at bedside. Patient is on high flow intravenous physician off of BiPAP REVIEW OF SYSTEMS:    Review of Systems  Constitutional: Positive for malaise/fatigue. Negative for chills and fever.  HENT: Negative.  Negative for ear discharge, ear pain, hearing loss, nosebleeds and sore throat.   Eyes: Negative.  Negative for blurred vision and pain.  Respiratory: Positive for cough, shortness of breath and wheezing. Negative for hemoptysis.   Cardiovascular: Negative.  Negative for chest pain, palpitations and leg swelling.  Gastrointestinal: Negative.  Negative for abdominal pain, blood in stool, diarrhea, nausea and vomiting.  Genitourinary: Negative.  Negative for dysuria.  Musculoskeletal: Negative.  Negative for back pain.  Skin: Negative.   Neurological: Positive for weakness. Negative for dizziness, tremors, speech change, focal weakness, seizures and headaches.  Endo/Heme/Allergies: Negative.  Does not bruise/bleed easily.  Psychiatric/Behavioral: Negative.  Negative for depression, hallucinations and suicidal ideas.     Tolerating Diet: yes   DRUG ALLERGIES:   Allergies  Allergen Reactions  . Aspirin Tinitus  . Prednisone Other (See Comments)    Makes violent  . Sulfa Antibiotics Nausea And Vomiting  . Symbicort [Budesonide-Formoterol Fumarate] Other (See Comments)    Patient felt "out of her mind" and angry.    VITALS:  Blood pressure 99/83, pulse 92, temperature 97.7 F (36.5 C), temperature source Oral, resp. rate 17, height 5' (1.524 m), weight 40.8 kg (89 lb 15.2 oz), SpO2 97 %.  PHYSICAL EXAMINATION:  Constitutional: Appears thin and frail not in distress on HFNC HENT: Normocephalic. . Eyes: Conjunctivae and EOM are normal. PERRLA, no scleral icterus.  Neck: Normal ROM. Neck supple. No JVD. No tracheal  deviation. CVS: RRR, S1/S2 +, no murmurs, no gallops, no carotid bruit.  Pulmonary bilateral wheezing and rales  Abdominal: Soft. BS +,  no distension, tenderness, rebound or guarding.  Musculoskeletal: Moves all extremities. No edema and no tenderness. Generalized weakness  Neuro: Sitting up on HFNC Skin: Skin is warm and dry. No rash noted. Psychiatric: alert normal mood  LABORATORY PANEL:   CBC Recent Labs  Lab 07/16/17 0124  WBC 14.0*  HGB 10.4*  HCT 30.4*  PLT 241   ------------------------------------------------------------------------------------------------------------------  Chemistries  Recent Labs  Lab 07/14/17 0311  07/16/17 0124 07/17/17 0329  NA 118*   < > 129* 129*  K 3.9   < > 4.2 4.4  CL 86*   < > 93* 89*  CO2 21*   < > 27 31  GLUCOSE 129*   < > 106* 115*  BUN 10   < > 28* 20  CREATININE 0.60   < > 0.72 0.54  CALCIUM 8.6*   < > 8.5* 9.1  MG  --    < > 1.9  --   AST 30  --   --   --   ALT 16  --   --   --   ALKPHOS 81  --   --   --   BILITOT 0.8  --   --   --    < > = values in this interval not displayed.   ------------------------------------------------------------------------------------------------------------------  Cardiac Enzymes Recent Labs  Lab 07/15/17 2038 07/16/17 0124 07/16/17 0800  TROPONINI 1.08* 1.15* 0.91*   ------------------------------------------------------------------------------------------------------------------  RADIOLOGY:  No results found.   ASSESSMENT AND PLAN:   77 y/o  female with CAD and COPD presents with SOB.  1. Acute hypoxic/hypercarbic resp failure with COPD exacerbation/PNA Patient weaned from BiPAP to high flow and plan to transition to nasal cannula when tolerated  CT chest shows no pulmonary emboli Continue nebulized bronchodilators  Continue azithromycin and ceftriaxone  2. COPD acute exacerbation: Continue IV steroids Continue nebulizer treatment  3. CAP: Continue rocephin and  Azithromycin   4. Elevated troponin without chest pain with hx of CAD:  [facility] cardiology consultation appreciated . Elevation in troponins due to demand ischemia. Patient has been ruled out for non-STEMI  Continue ASA, Plavix,metoprolol, NTG  5 PVD/AAA/bilateral CAS Follows with vascular surgery AAA is stable 3.0 cm   6. Hyponatremia, acute on chronic: Sodium level has improved with IV fluids Nephrology consultation appreciated Sodium stable 129 this morning   Physical therapy consultation is recommended Management plans discussed with intensivist and family CODE STATUS: FULL  TOTAL TIME TAKING CARE OF THIS PATIENT: 24 minutes.     POSSIBLE D/C 2-4 days, DEPENDING ON CLINICAL CONDITION.   Izmael Duross M.D on 07/17/2017 at 8:26 AM  Between 7am to 6pm - Pager - [phone] After 6pm go to [email or web address] - password [email or web address] [place] [facility]  Office  [phone]  CC: Primary care physician; Lavera Guise, MD  Note: This dictation was prepared with Dragon dictation along with smaller phrase technology. Any transcriptional errors that result from this process are unintentional.

## 2017-07-17 NOTE — Progress Notes (Signed)
[facility], [place] 07/17/17  Subjective:   Patient remains critically ill. She is feeling overall better today.  Urine output 2100 cc yesterday Sitting up in the chair.  States she is feeling cold.  No nausea vomiting.  No shortness of breath reported  Objective:  Vital signs in last 24 hours:  Temp:  [97.7 F (36.5 C)-98.2 F (36.8 C)] 97.7 F (36.5 C) (01/01 0825) Pulse Rate:  [66-103] 80 (01/01 1100) Resp:  [11-32] 32 (01/01 1100) BP: (93-181)/(60-127) 148/72 (01/01 1100) SpO2:  [89 %-100 %] 97 % (01/01 1128) FiO2 (%):  [40 %-45 %] 45 % (01/01 1128) Weight:  [40.8 kg (89 lb 15.2 oz)] 40.8 kg (89 lb 15.2 oz) (01/01 0500)  Weight change: -0.3 kg (-10.6 oz) Filed Weights   07/15/17 0430 07/16/17 0500 07/17/17 0500  Weight: 41.6 kg (91 lb 11.4 oz) 41.1 kg (90 lb 9.7 oz) 40.8 kg (89 lb 15.2 oz)    Intake/Output:    Intake/Output Summary (Last 24 hours) at 07/17/2017 1348 Last data filed at 07/17/2017 1051 Gross per 24 hour  Intake 110 ml  Output 1445 ml  Net -1335 ml     Physical Exam: General: NAD, sitting up in bed  HEENT Moist oral mucus membranes, high flow nasal cannula  Neck supple   Pulm/lungs  decreased breath sounds at bases, mild crackles,  CVS/Heart Regular, no rub  Abdomen:  soft  Extremities: No edema  Neurologic:  Alert, able to answer questions  Skin: No acute rashes          Basic Metabolic Panel:  Recent Labs  Lab 07/14/17 0311 07/14/17 0645 07/15/17 0358 07/16/17 0124 07/17/17 0329  NA 118* 121* 127* 129* 129*  K 3.9 3.6 4.6 4.2 4.4  CL 86* 91* 98* 93* 89*  CO2 21* 21* 24 27 31   GLUCOSE 129* 144* 124* 106* 115*  BUN 10 9 17  28* 20  CREATININE 0.60 0.67 0.52 0.72 0.54  CALCIUM 8.6* 8.4* 8.6* 8.5* 9.1  MG  --   --  1.8 1.9  --   PHOS  --   --  2.9  --   --      CBC: Recent Labs  Lab 07/14/17 0311 07/16/17 0124  WBC 12.5* 14.0*  NEUTROABS 10.0*  --   HGB 11.6* 10.4*  HCT 33.0* 30.4*  MCV 89.1  91.1  PLT 239 241     No results found for: HEPBSAG, HEPBSAB, HEPBIGM    Microbiology:  Recent Results (from the past 240 hour(s))  MRSA PCR Screening     Status: None   Collection Time: 07/14/17  6:11 PM  Result Value Ref Range Status   MRSA by PCR NEGATIVE NEGATIVE Final    Comment:        The GeneXpert MRSA Assay (FDA approved for NASAL specimens only), is one component of a comprehensive MRSA colonization surveillance program. It is not intended to diagnose MRSA infection nor to guide or monitor treatment for MRSA infections. Performed at [facility], [address] [place], [place] [address]     Coagulation Studies: No results for input(s): LABPROT, INR in the last 72 hours.  Urinalysis: No results for input(s): COLORURINE, LABSPEC, PHURINE, GLUCOSEU, HGBUR, BILIRUBINUR, KETONESUR, PROTEINUR, UROBILINOGEN, NITRITE, LEUKOCYTESUR in the last 72 hours.  Invalid input(s): APPERANCEUR    Imaging: No results found.   Medications:   . cefTRIAXone (ROCEPHIN)  IV Stopped (07/17/17 1042)   . amLODipine  5 mg Oral Daily  .  azithromycin  500 mg Oral Daily  . benzonatate  100 mg Oral TID  . budesonide (PULMICORT) nebulizer solution  0.5 mg Nebulization BID  . buPROPion  100 mg Oral Daily  . chlorhexidine  15 mL Mouth Rinse BID  . clopidogrel  75 mg Oral QHS  . docusate sodium  100 mg Oral BID  . enoxaparin (LOVENOX) injection  30 mg Subcutaneous Q24H  . feeding supplement  1 Container Oral Daily  . ipratropium-albuterol  3 mL Nebulization Q4H  . losartan  100 mg Oral Daily  . mouth rinse  15 mL Mouth Rinse q12n4p  . methylPREDNISolone (SOLU-MEDROL) injection  20 mg Intravenous BID  . metoprolol tartrate  25 mg Oral BID  . [START ON 07/18/2017] tiotropium  18 mcg Inhalation q morning - 10a   acetaminophen **OR** acetaminophen, albuterol, guaiFENesin-dextromethorphan, LORazepam, Melatonin, morphine injection, nitroGLYCERIN, ondansetron **OR** ondansetron  (ZOFRAN) IV  Assessment/ Plan:  77 y.o.caucasian female with coronary artery disease, AAA, hypertension, COPD, was admitted on 07/14/2017 with COPD exacerbation and is found to have worsening hyponatremia  1.  Chronic hyponatremia with acute exacerbation Chronic hyponatremia may be related to underlying lung disease, "tea and toast" diet Acute exacerbation secondary to volume overload.  Improved with diuresis. SPEP and UPEP are pending  2. SOB  - likely combination of COPD exacerbation and acute pulmonary edema -Fluid restriction to 1200 cc -  IV Lasix as necessary.      LOS: 3 Tally Mattox 1/1/20191:48 PM  [facility] [place], [place]

## 2017-07-17 NOTE — Evaluation (Signed)
Physical Therapy Evaluation Patient Details Name: MARIEL LUKINS MRN: [id number] DOB: 09/21/1940 Today's Date: 07/17/2017   History of Present Illness  77 y/o female admitted with hyponatremia.  past medical history of coronary artery disease, stable AAA, hypertension and COPD presents to [facility] complaining of shortness of breath.    Clinical Impression  Pt did well with mobility and transfers. She had some slight unsteadiness with standing/mobility but overall did not have significant safety concerns but expect that she will be able to ambulate better and with more confidence as she improves medically and does not have to deal with HFNC and all the wires, etc associated with CCU.  Pt pleasant and eager to do all she can, feels confident about being able to go home once she is breathing better.     Follow Up Recommendations Home health PT(per continued improvement)    Equipment Recommendations  None recommended by PT    Recommendations for Other Services       Precautions / Restrictions Precautions Precautions: Fall Restrictions Weight Bearing Restrictions: No      Mobility  Bed Mobility Overal bed mobility: Independent             General bed mobility comments: Pt able to get up to sitting EOB w/o issue  Transfers Overall transfer level: Modified independent Equipment used: None             General transfer comment: Pt intiailly with osme unsteadiness but is able to maintain balance w/o direct assist.   Ambulation/Gait Ambulation/Gait assistance: Supervision Ambulation Distance (Feet): 30 Feet Assistive device: None       General Gait Details: Pt weak from being in bed and with some baseline fatigue.  Pt's O2 remains stable t/o the effort on 40% HFNC.  She did not have any overt LOBs needing assist, but had multiple small stagger steps with limited in room ambulation.  Stairs            Wheelchair Mobility    Modified Rankin (Stroke Patients Only)        Balance Overall balance assessment: Modified Independent                                           Pertinent Vitals/Pain Pain Assessment: No/denies pain    Home Living Family/patient expects to be discharged to:: Private residence Living Arrangements: Children Available Help at Discharge: Family   Home Access: Stairs to enter   Entrance Stairs-Number of Steps: [place]: Environmental consultant - 2 wheels;Cane - single point      Prior Function Level of Independence: Independent         Comments: Pt very active, no issues or need for ADs     Hand Dominance        Extremity/Trunk Assessment   Upper Extremity Assessment Upper Extremity Assessment: Generalized weakness;Overall [facility] for tasks assessed    Lower Extremity Assessment Lower Extremity Assessment: Generalized weakness;Overall WFL for tasks assessed       Communication   Communication: No difficulties  Cognition Arousal/Alertness: Awake/alert Behavior During Therapy: WFL for tasks assessed/performed Overall Cognitive Status: Within Functional Limits for tasks assessed                                        General  Comments      Exercises     Assessment/Plan    PT Assessment Patient needs continued PT services  PT Problem List Decreased strength;Decreased activity tolerance;Decreased range of motion;Decreased balance;Decreased mobility;Decreased safety awareness;Cardiopulmonary status limiting activity       PT Treatment Interventions DME instruction;Gait training;Therapeutic activities;Functional mobility training;Stair training;Therapeutic exercise;Balance training;Patient/family education    PT Goals (Current goals can be found in the Care Plan section)  Acute Rehab PT Goals Patient Stated Goal: go home PT Goal Formulation: With patient Time For Goal Achievement: 07/31/17 Potential to Achieve Goals: Good    Frequency Min 2X/week   Barriers to  discharge        Co-evaluation               AM-PAC PT "6 Clicks" Daily Activity  Outcome Measure Difficulty turning over in bed (including adjusting bedclothes, sheets and blankets)?: None Difficulty moving from lying on back to sitting on the side of the bed? : None Difficulty sitting down on and standing up from a chair with arms (e.g., wheelchair, bedside commode, etc,.)?: None Help needed moving to and from a bed to chair (including a wheelchair)?: A Little Help needed walking in hospital room?: A Little Help needed climbing 3-5 steps with a railing? : A Little 6 Click Score: 21    End of Session Equipment Utilized During Treatment: Gait belt;Oxygen(HFNC) Activity Tolerance: Patient limited by fatigue Patient left: in bed;with call bell/phone within reach;with nursing/sitter in room Nurse Communication: Mobility status PT Visit Diagnosis: Muscle weakness (generalized) (M62.81);Difficulty in walking, not elsewhere classified (R26.2)    Time: 7096-2836 PT Time Calculation (min) (ACUTE ONLY): 20 min   Charges:   PT Evaluation $PT Eval Low Complexity: 1 Low     PT G Codes:   PT G-Codes **NOT FOR INPATIENT CLASS** Functional Assessment Tool Used: AM-PAC 6 Clicks Basic Mobility Functional Limitation: Mobility: Walking and moving around Mobility: Walking and Moving Around Current Status (O2947): At least 20 percent but less than 40 percent impaired, limited or restricted Mobility: Walking and Moving Around Goal Status [phone]): At least 1 percent but less than 20 percent impaired, limited or restricted    Kreg Shropshire, DPT 07/17/2017, 4:31 PM

## 2017-07-17 NOTE — Progress Notes (Signed)
Pt off HFNC- now on 3 liters nasal cannula.  Tolerating well- sating 98%.  Pt up to [facility] and chair today.  Vitals stable.  No complaint of pain or shortness of breath.

## 2017-07-18 LAB — BASIC METABOLIC PANEL
Anion gap: 9 (ref 5–15)
BUN: 19 mg/dL (ref 6–20)
CHLORIDE: 90 mmol/L — AB (ref 101–111)
CO2: 32 mmol/L (ref 22–32)
CREATININE: 0.75 mg/dL (ref 0.44–1.00)
Calcium: 9.2 mg/dL (ref 8.9–10.3)
GFR calc non Af Amer: >60 mL/min (ref >60)
Glucose, Bld: 115 mg/dL — ABNORMAL HIGH (ref 65–99)
Potassium: 4.2 mmol/L (ref 3.5–5.1)
SODIUM: 131 mmol/L — AB (ref 135–145)

## 2017-07-18 LAB — PROTEIN ELECTROPHORESIS, SERUM
A/G Ratio: 1.1 (ref 0.7–1.7)
ALBUMIN ELP: 3.1 g/dL (ref 2.9–4.4)
ALPHA-1-GLOBULIN: 0.4 g/dL (ref 0.0–0.4)
ALPHA-2-GLOBULIN: 0.8 g/dL (ref 0.4–1.0)
Beta Globulin: 0.9 g/dL (ref 0.7–1.3)
GAMMA GLOBULIN: 0.8 g/dL (ref 0.4–1.8)
Globulin, Total: 2.9 g/dL (ref 2.2–3.9)
TOTAL PROTEIN ELP: 6 g/dL (ref 6.0–8.5)

## 2017-07-18 LAB — PROTEIN ELECTRO, RANDOM URINE
ALPHA-2-GLOBULIN, U: 15.7 %
Albumin ELP, Urine: 45.9 %
Alpha-1-Globulin, U: 5.6 %
BETA GLOBULIN, U: 18.9 %
GAMMA GLOBULIN, U: 13.8 %
TOTAL PROTEIN, URINE-UPE24: 37.8 mg/dL

## 2017-07-18 MED ORDER — ADULT MULTIVITAMIN W/MINERALS CH
1.0000 | ORAL_TABLET | Freq: Every day | ORAL | Status: DC
  Administered 2017-07-18 – 2017-07-19 (×2): 1 via ORAL
  Filled 2017-07-18 (×2): qty 1

## 2017-07-18 MED ORDER — ENSURE ENLIVE PO LIQD
237.0000 mL | Freq: Two times a day (BID) | ORAL | Status: DC
  Administered 2017-07-18: 237 mL via ORAL

## 2017-07-18 MED ORDER — SENNA 8.6 MG PO TABS
1.0000 | ORAL_TABLET | Freq: Every day | ORAL | Status: DC
  Administered 2017-07-18 – 2017-07-19 (×2): 8.6 mg via ORAL
  Filled 2017-07-18 (×2): qty 1

## 2017-07-18 NOTE — Progress Notes (Signed)
Family Meeting Note  Advance Directive:no  Today a meeting took place with the Patient.    The following clinical team members were present during this meeting:MD  The following were discussed:Patient's diagnosis: severe COPD with hypoxic resp failure  Patient's progosis: Unable to determine and Goals for treatment: Full Code  Additional follow-up to be provided: chaplain consult for advanced directives  Time spent during discussion:18 minutes  Christina Bessette, MD

## 2017-07-18 NOTE — Progress Notes (Signed)
[place] [facility] [place] NAME: Christina Hester    MR#:  [id number]  DATE OF BIRTH:  June 09, 1941  SUBJECTIVE:   Patient off high flow on nasal cannula. She feels a lot better. Shortness of breath is improved. REVIEW OF SYSTEMS:    Review of Systems  Constitutional: Positive for malaise/fatigue. Negative for chills and fever.  HENT: Negative.  Negative for ear discharge, ear pain, hearing loss, nosebleeds and sore throat.   Eyes: Positive for pain. Negative for blurred vision.  Respiratory: Positive for cough and shortness of breath (better). Negative for hemoptysis and wheezing.   Cardiovascular: Negative.  Negative for chest pain, palpitations and leg swelling.  Gastrointestinal: Negative.  Negative for abdominal pain, blood in stool, diarrhea, nausea and vomiting.  Genitourinary: Negative.  Negative for dysuria.  Musculoskeletal: Negative.  Negative for back pain.  Skin: Negative.   Neurological: Positive for weakness. Negative for dizziness, tremors, speech change, focal weakness, seizures and headaches.  Endo/Heme/Allergies: Negative.  Does not bruise/bleed easily.  Psychiatric/Behavioral: Negative.  Negative for depression, hallucinations and suicidal ideas.     Tolerating Diet: yes   DRUG ALLERGIES:   Allergies  Allergen Reactions  . Aspirin Tinitus  . Prednisone Other (See Comments)    Makes violent  . Sulfa Antibiotics Nausea And Vomiting  . Symbicort [Budesonide-Formoterol Fumarate] Other (See Comments)    Patient felt "out of her mind" and angry.    VITALS:  Blood pressure (!) 149/136, pulse 78, temperature 97.9 F (36.6 C), temperature source Oral, resp. rate (!) 23, height 5' (1.524 m), weight 40.8 kg (89 lb 15.2 oz), SpO2 96 %.  PHYSICAL EXAMINATION:  Constitutional: Appears thin and frail not in distress on HFNC HENT: Normocephalic. . Eyes: Conjunctivae and EOM are normal. PERRLA, no scleral icterus.  Neck: Normal ROM.  Neck supple. No JVD. No tracheal deviation. CVS: RRR, S1/S2 +, no murmurs, no gallops, no carotid bruit.  Pulmonary decreased throughout lung fields but no expiratory wheezing or rales Abdominal: Soft. BS +,  no distension, tenderness, rebound or guarding.  Musculoskeletal: Moves all extremities. No edema and no tenderness. Generalized weakness  Neuro: Sitting up on HFNC Skin: Skin is warm and dry. No rash noted. Psychiatric: alert normal mood  LABORATORY PANEL:   CBC Recent Labs  Lab 07/16/17 0124  WBC 14.0*  HGB 10.4*  HCT 30.4*  PLT 241   ------------------------------------------------------------------------------------------------------------------  Chemistries  Recent Labs  Lab 07/14/17 0311  07/16/17 0124  07/18/17 0523  NA 118*   < > 129*   < > 131*  K 3.9   < > 4.2   < > 4.2  CL 86*   < > 93*   < > 90*  CO2 21*   < > 27   < > 32  GLUCOSE 129*   < > 106*   < > 115*  BUN 10   < > 28*   < > 19  CREATININE 0.60   < > 0.72   < > 0.75  CALCIUM 8.6*   < > 8.5*   < > 9.2  MG  --    < > 1.9  --   --   AST 30  --   --   --   --   ALT 16  --   --   --   --   ALKPHOS 81  --   --   --   --   BILITOT 0.8  --   --   --   --    < > =  values in this interval not displayed.   ------------------------------------------------------------------------------------------------------------------  Cardiac Enzymes Recent Labs  Lab 07/15/17 2038 07/16/17 0124 07/16/17 0800  TROPONINI 1.08* 1.15* 0.91*   ------------------------------------------------------------------------------------------------------------------  RADIOLOGY:  No results found.   ASSESSMENT AND PLAN:   77 y/o female with CAD and COPD presents with SOB.  1. Acute hypoxic/hypercarbic resp failure with COPD exacerbation/PNA Patient weaned from BiPAP to nasal cannula and doing well  CT chest shows no pulmonary emboli Continue nebulized bronchodilators  Continue azithromycin and ceftriaxone for 2 more  days.  2. COPD acute exacerbation: Continue to wean IV steroids Continue nebulizer treatment  3. CAP: Continue rocephin and Azithromycin for 2 more days   4. Elevated troponin without chest pain with hx of CAD:  [facility] cardiology consultation appreciated . Elevation in troponins due to demand ischemia. Patient has been ruled out for non-STEMI  Continue ASA, Plavix,metoprolol, NTG  5 PVD/AAA/bilateral CAS Follows with vascular surgery AAA is stable 3.0 cm   6. Hyponatremia, acute on chronic: Sodium level has improved with IV fluids Nephrology consultation appreciated Sodium stable 131 this morning   Physical therapy consultation is recommending home with home health care at discharge   Management plans discussed with intensivist and family CODE STATUS: FULL  TOTAL TIME TAKING CARE OF THIS PATIENT: 24 minutes.     POSSIBLE D/C Friday, DEPENDING ON CLINICAL CONDITION.   Christina Hester M.D on 07/18/2017 at 9:06 AM  Between 7am to 6pm - Pager - [phone] After 6pm go to [email or web address] - password [email or web address] [facility]  Office  [phone]  CC: Primary care physician; Lavera Guise, MD  Note: This dictation was prepared with Dragon dictation along with smaller phrase technology. Any transcriptional errors that result from this process are unintentional.

## 2017-07-18 NOTE — Progress Notes (Signed)
[facility], [place] 07/18/17  Subjective:  Patient feeling better today. Blood pressure elevated. Serum sodium currently 131.   Objective:  Vital signs in last 24 hours:  Temp:  [97.5 F (36.4 C)-98.4 F (36.9 C)] 97.9 F (36.6 C) (01/02 0806) Pulse Rate:  [33-102] 81 (01/02 0929) Resp:  [12-32] 22 (01/02 0929) BP: (93-181)/(45-136) 145/71 (01/02 0929) SpO2:  [85 %-100 %] 96 % (01/02 0929) FiO2 (%):  [45 %] 45 % (01/01 1128)  Weight change:  Filed Weights   07/15/17 0430 07/16/17 0500 07/17/17 0500  Weight: 41.6 kg (91 lb 11.4 oz) 41.1 kg (90 lb 9.7 oz) 40.8 kg (89 lb 15.2 oz)    Intake/Output:    Intake/Output Summary (Last 24 hours) at 07/18/2017 0941 Last data filed at 07/18/2017 0500 Gross per 24 hour  Intake 200 ml  Output 1340 ml  Net -1140 ml     Physical Exam: General: NAD, sitting up in bed  HEENT Moist oral mucus membranes  Neck supple   Pulm/lungs decreased breath sounds at bases, mild crackles  CVS/Heart Regular, no rub  Abdomen:  Soft, NTND, BS present  Extremities: No edema  Neurologic: Alert, able to answer questions  Skin: No acute rashes          Basic Metabolic Panel:  Recent Labs  Lab 07/14/17 0645 07/15/17 0358 07/16/17 0124 07/17/17 0329 07/18/17 0523  NA 121* 127* 129* 129* 131*  K 3.6 4.6 4.2 4.4 4.2  CL 91* 98* 93* 89* 90*  CO2 21* 24 27 31  32  GLUCOSE 144* 124* 106* 115* 115*  BUN 9 17 28* 20 19  CREATININE 0.67 0.52 0.72 0.54 0.75  CALCIUM 8.4* 8.6* 8.5* 9.1 9.2  MG  --  1.8 1.9  --   --   PHOS  --  2.9  --   --   --      CBC: Recent Labs  Lab 07/14/17 0311 07/16/17 0124  WBC 12.5* 14.0*  NEUTROABS 10.0*  --   HGB 11.6* 10.4*  HCT 33.0* 30.4*  MCV 89.1 91.1  PLT 239 241     No results found for: HEPBSAG, HEPBSAB, HEPBIGM    Microbiology:  Recent Results (from the past 240 hour(s))  MRSA PCR Screening     Status: None   Collection Time: 07/14/17  6:11 PM  Result Value Ref  Range Status   MRSA by PCR NEGATIVE NEGATIVE Final    Comment:        The GeneXpert MRSA Assay (FDA approved for NASAL specimens only), is one component of a comprehensive MRSA colonization surveillance program. It is not intended to diagnose MRSA infection nor to guide or monitor treatment for MRSA infections. Performed at [facility], [address] [place], [place] [address]     Coagulation Studies: No results for input(s): LABPROT, INR in the last 72 hours.  Urinalysis: No results for input(s): COLORURINE, LABSPEC, PHURINE, GLUCOSEU, HGBUR, BILIRUBINUR, KETONESUR, PROTEINUR, UROBILINOGEN, NITRITE, LEUKOCYTESUR in the last 72 hours.  Invalid input(s): APPERANCEUR    Imaging: No results found.   Medications:   . cefTRIAXone (ROCEPHIN)  IV Stopped (07/18/17 0932)   . amLODipine  5 mg Oral Daily  . benzonatate  100 mg Oral TID  . budesonide (PULMICORT) nebulizer solution  0.5 mg Nebulization BID  . buPROPion  100 mg Oral Daily  . chlorhexidine  15 mL Mouth Rinse BID  . clopidogrel  75 mg Oral QHS  . docusate sodium  100 mg Oral BID  . enoxaparin (LOVENOX) injection  30 mg Subcutaneous Q24H  . feeding supplement  1 Container Oral Daily  . ipratropium-albuterol  3 mL Nebulization Q4H  . losartan  100 mg Oral Daily  . mouth rinse  15 mL Mouth Rinse q12n4p  . methylPREDNISolone (SOLU-MEDROL) injection  20 mg Intravenous BID  . metoprolol tartrate  25 mg Oral BID  . senna  1 tablet Oral Daily  . tiotropium  18 mcg Inhalation q morning - 10a   acetaminophen **OR** acetaminophen, albuterol, guaiFENesin-dextromethorphan, LORazepam, Melatonin, morphine injection, nitroGLYCERIN, ondansetron **OR** ondansetron (ZOFRAN) IV  Assessment/ Plan:  77 y.o.caucasian female with coronary artery disease, AAA, hypertension, COPD, was admitted on 07/14/2017 with COPD exacerbation and is found to have worsening hyponatremia  1.  Chronic hyponatremia with acute  exacerbation -Serum sodium up to 131 this a.m.  This appears to be close to her baseline.  Continue to monitor serum sodium daily for now.  2. SOB  - likely combination of COPD exacerbation and acute pulmonary edema -Continue fluid restriction at this time.  Breathing comfortably at the moment.      LOS: 4 Seleen Walter 1/2/20199:41 [place], [place]

## 2017-07-18 NOTE — Progress Notes (Signed)
Chaplain received an OR for AD. CH met pt, educated pt on AD. Pt states she will review material with daughter and will contact [place] when ready to complete. CH is available to follow up pt as needed.    07/18/17 1200  Clinical Encounter Type  Visited With Patient;Health care provider  Visit Type Initial;Other (Comment)  Referral From Nurse  Consult/Referral To Chaplain  Spiritual Encounters  Spiritual Needs Literature;Brochure

## 2017-07-18 NOTE — Progress Notes (Signed)
Physical Therapy Treatment Patient Details Name: SHAMIA UPPAL MRN: [id number] DOB: [birth year] Today's Date: 07/18/2017    History of Present Illness 77 y/o female admitted with hyponatremia.  past medical history of coronary artery disease, stable AAA, hypertension and COPD presents to [facility] complaining of shortness of breath.      PT Comments    Pt is doing well with therapy and motivated to ambulate. Pt trial on room air with sats WNL. RN notified. Ambulated in hallway without AD, however slightly unsteady, reaches for rail at times. Left seated on side of bed doing there-ex. Will continue to progress.   Follow Up Recommendations  Home health PT     Equipment Recommendations  None recommended by PT    Recommendations for Other Services       Precautions / Restrictions Precautions Precautions: Fall Restrictions Weight Bearing Restrictions: No    Mobility  Bed Mobility Overal bed mobility: Independent             General bed mobility comments: Pt able to get up to sitting EOB w/o issue  Transfers Overall transfer level: Modified independent Equipment used: None             General transfer comment: Pt intiailly with osme unsteadiness but is able to maintain balance w/o direct assist.   Ambulation/Gait Ambulation/Gait assistance: Supervision Ambulation Distance (Feet): 60 Feet Assistive device: None Gait Pattern/deviations: WFL(Within Functional Limits)     General Gait Details: ambulated with slight unsteadiness, however no AD noted. Pt ambulated on room air with sats at 92-95%. No SOB symptoms noted.  Slight fatigue noted   Stairs            Wheelchair Mobility    Modified Rankin (Stroke Patients Only)       Balance                                            Cognition Arousal/Alertness: Awake/alert Behavior During Therapy: WFL for tasks assessed/performed Overall Cognitive Status: Within Functional Limits for tasks  assessed                                        Exercises      General Comments        Pertinent Vitals/Pain Pain Assessment: No/denies pain    Home Living                      Prior Function            PT Goals (current goals can now be found in the care plan section) Acute Rehab PT Goals Patient Stated Goal: go home PT Goal Formulation: With patient Time For Goal Achievement: 07/31/17 Potential to Achieve Goals: Good Progress towards PT goals: Progressing toward goals    Frequency    Min 2X/week      PT Plan Current plan remains appropriate    Co-evaluation              AM-PAC PT "6 Clicks" Daily Activity  Outcome Measure  Difficulty turning over in bed (including adjusting bedclothes, sheets and blankets)?: None Difficulty moving from lying on back to sitting on the side of the bed? : None Difficulty sitting down on and standing up from a chair with arms (  e.g., wheelchair, bedside commode, etc,.)?: None Help needed moving to and from a bed to chair (including a wheelchair)?: A Little Help needed walking in hospital room?: A Little Help needed climbing 3-5 steps with a railing? : A Little 6 Click Score: 21    End of Session Equipment Utilized During Treatment: Gait belt;Oxygen Activity Tolerance: Patient limited by fatigue Patient left: in bed;with bed alarm set Nurse Communication: Mobility status PT Visit Diagnosis: Muscle weakness (generalized) (M62.81);Difficulty in walking, not elsewhere classified (R26.2)     Time: 7893-8101 PT Time Calculation (min) (ACUTE ONLY): 10 min  Charges:  $Gait Training: 8-22 mins                    G Codes:       Greggory Stallion, PT, DPT [phone]    Jenni Thew 07/18/2017, 5:23 PM

## 2017-07-18 NOTE — Care Management Important Message (Signed)
Important Message  Patient Details  Name: Christina Hester MRN: [id number] Date of Birth: 05-05-1941   Medicare Important Message Given:  Yes    Beverly Sessions, RN 07/18/2017, 2:02 PM

## 2017-07-18 NOTE — Progress Notes (Addendum)
Initial Nutrition Assessment  DOCUMENTATION CODES:   Severe malnutrition in context of chronic illness  INTERVENTION:   Ensure Enlive po BID, each supplement provides 350 kcal and 20 grams of protein  Magic cup TID with meals, each supplement provides 290 kcal and 9 grams of protein  Liberalize diet  MVI daily  Bowel regimen as needed per MD  NUTRITION DIAGNOSIS:   Severe Malnutrition related to catabolic illness(COPD) as evidenced by severe muscle depletion, severe fat depletion.  GOAL:   Patient will meet greater than or equal to 90% of their needs  MONITOR:   PO intake, Supplement acceptance, Weight trends, Labs, I & O's, Skin  REASON FOR ASSESSMENT:   Other (Comment)(low BMI)    ASSESSMENT:   77 y.o.caucasian female with coronary artery disease, AAA, hypertension, COPD, was admitted on 07/14/2017 with COPD exacerbation and is found to have worsening hyponatremia   Met with pt in room today. Pt reports good appetite and oral intake pta. Pt reports that she eats mainly small portions and likes to snack. Pt will drink Ensure but does not really like them. Pt reports that her refrigerator at home is full of supplements that she does not drink. Per chart, pt is weight stable. Pt loves meats, mainly chicken. RD discussed with pt the importance of adequate protein intake needed to preserve lean muscle. Pt is willing to drink Ensure while in the hospital. RD will also order MVI. Pt is currently eating 50% of her meals in hospital. No BM documented since 12/29; recommend bowel regimen as needed.   Medications reviewed and include: plavix, colace, lovenox, solu-medrol, senokot, ceftriaxone  Labs reviewed: Na 131(L), Cl 90(L) Wbc- 14.0(L), Hgb 10.4(L), Hct 30.4(L)  Nutrition-Focused physical exam completed. Findings are severe fat and muscle depletions over entire body, and no edema. Pt noted to have bruising on bilateral arms; reports that she bruises easy. Pt is on blood  thinners.   Diet Order:  Diet regular Room service appropriate? Yes; Fluid consistency: Thin  EDUCATION NEEDS:   Education needs have been addressed  Skin:  Reviewed RN Assessment  Last BM:  12/29  Height:   Ht Readings from Last 1 Encounters:  07/14/17 5' (1.524 m)    Weight:   Wt Readings from Last 1 Encounters:  07/17/17 89 lb 15.2 oz (40.8 kg)    Ideal Body Weight:  50 kg  BMI:  Body mass index is 17.57 kg/m.  Estimated Nutritional Needs:   Kcal:  1200-1400kcal/day   Protein:  65-72g/day   Fluid:  >1.2L/day   Koleen Distance MS, RD, LDN Pager #[phone] After Hours Pager: [phone]

## 2017-07-18 NOTE — Progress Notes (Signed)
Pt refuses Cpap she states she does not wear Cpap at home nor oxygen and she doesn't need it.

## 2017-07-18 NOTE — Care Management (Signed)
Patient admitted from home with acute respiratory failure due to COPD and PNA.  Patient live at home with family.  PCP Humphrey Rolls.  Pharmacy [place]. PT has assessed patient and recommends Home health PT.  Patient has RW and cane in the home.  [facility] health services to be arranged by [facility].  Patient currently requiring acute O2.  RNCM following for discharge planning needs.

## 2017-07-18 NOTE — Consult Note (Signed)
PULMONARY / CRITICAL CARE MEDICINE  Name: Christina Hester MRN: [id number] DOB: 19-Nov-1940    ADMISSION DATE:  07/14/2017   CONSULTATION DATE:  07/14/2017  REFERRING MD:  Dr. Benjie Karvonen  REASON: Acute respiratory failure  HISTORY OF PRESENT ILLNESS:  SOB improved Alert and awake   Review of Systems:  Gen:  Denies  fever, sweats, chills weigh loss   HEENT: Denies blurred vision, double vision, ear pain, eye pain, hearing loss, nose bleeds, sore throat  Cardiac:  No dizziness, chest pain or heaviness, chest tightness,edema  Resp:   Denies cough or sputum porduction, shortness of breath,wheezing, hemoptysis,   Other:  All other systems negative    VITAL SIGNS: BP 113/70   Pulse 77   Temp 98.4 F (36.9 C) (Oral)   Resp 20   Ht 5' (1.524 m)   Wt 89 lb 15.2 oz (40.8 kg)   SpO2 (!) 85%   BMI 17.57 kg/m      VENTILATOR SETTINGS: FiO2 (%):  [45 %] 45 %  INTAKE / OUTPUT: I/O last 3 completed shifts: In: 200 [P.O.:150; IV Piggyback:50] Out: 2060 [Urine:2060]  PHYSICAL EXAMINATION: General: Cachectic NAD Neuro: Alert and oriented x3 moves all extremities follows commands HEENT: PERRLA, trachea midline, no JVD Cardiovascular: Pulse regular, S1-S2, no murmur regurg or gallop, no edema, +2 pulses Lungs: +rhonchi,wheezes Abdomen: Nondistended, normal bowel sounds in all 4 quadrants Musculoskeletal: No deformities, positive range of motion  skin: Warm and dry  LABS:  BMET Recent Labs  Lab 07/16/17 0124 07/17/17 0329 07/18/17 0523  NA 129* 129* 131*  K 4.2 4.4 4.2  CL 93* 89* 90*  CO2 27 31 32  BUN 28* 20 19  CREATININE 0.72 0.54 0.75  GLUCOSE 106* 115* 115*    Electrolytes Recent Labs  Lab 07/15/17 0358 07/16/17 0124 07/17/17 0329 07/18/17 0523  CALCIUM 8.6* 8.5* 9.1 9.2  MG 1.8 1.9  --   --   PHOS 2.9  --   --   --     CBC Recent Labs  Lab 07/14/17 0311 07/16/17 0124  WBC 12.5* 14.0*  HGB 11.6* 10.4*  HCT 33.0* 30.4*  PLT 239 241     Coag's No results for input(s): APTT, INR in the last 168 hours.  Sepsis Markers Recent Labs  Lab 07/15/17 0358 07/16/17 0124 07/17/17 0329  PROCALCITON 1.46 1.92 0.72    ABG Recent Labs  Lab 07/14/17 1722 07/14/17 1924 07/15/17 2009  PHART 7.23* 7.27* 7.36  PCO2ART 60* 52* 48  PO2ART 71* 126* 130*    Liver Enzymes Recent Labs  Lab 07/14/17 0311  AST 30  ALT 16  ALKPHOS 81  BILITOT 0.8  ALBUMIN 3.6    Cardiac Enzymes Recent Labs  Lab 07/15/17 2038 07/16/17 0124 07/16/17 0800  TROPONINI 1.08* 1.15* 0.91*   STUDIES:  Last echo was in September 2018: LV EF: 55% -   60% CULTURES: None  ANTIBIOTICS: Azithromycin Ceftriaxone  SIGNIFICANT EVENTS: 07/14/2017: Admitted  LINES/TUBES: Peripheral IV  ASSESSMENT patient admitted for severe and Acute hypoxic/hypercarbic respiratory failure-slowly resolving Acute COPD exacerbation Probable community-acquired pneumonia Current tobacco use History of hypertension, hyperlipidemia, coronary artery disease, and peripheral artery disease  PLAN nasal cannula as tolerated Nebulized bronchodilators Nebulized steroids Continue IV steroids GI and DVT prophylaxis  Ok to transfer to gen med floor    Porsha Skilton Patricia Pesa, M.D.  [facility]  Medical Director [place] Director [place] Department

## 2017-07-19 ENCOUNTER — Other Ambulatory Visit: Payer: Self-pay

## 2017-07-19 LAB — BASIC METABOLIC PANEL
Anion gap: 9 (ref 5–15)
BUN: 30 mg/dL — AB (ref 6–20)
CALCIUM: 8.8 mg/dL — AB (ref 8.9–10.3)
CO2: 30 mmol/L (ref 22–32)
CREATININE: 0.73 mg/dL (ref 0.44–1.00)
Chloride: 90 mmol/L — ABNORMAL LOW (ref 101–111)
GFR calc Af Amer: >60 mL/min (ref >60)
GLUCOSE: 131 mg/dL — AB (ref 65–99)
Potassium: 4.9 mmol/L (ref 3.5–5.1)
Sodium: 129 mmol/L — ABNORMAL LOW (ref 135–145)

## 2017-07-19 MED ORDER — CEFUROXIME AXETIL 500 MG PO TABS
500.0000 mg | ORAL_TABLET | Freq: Two times a day (BID) | ORAL | Status: DC
  Administered 2017-07-19: 500 mg via ORAL
  Filled 2017-07-19 (×2): qty 1

## 2017-07-19 MED ORDER — LORAZEPAM 0.5 MG PO TABS: 0.5000 mg | ORAL_TABLET | Freq: Two times a day (BID) | ORAL | Status: DC | PRN

## 2017-07-19 MED ORDER — BENZONATATE 100 MG PO CAPS: 100.0000 mg | ORAL_CAPSULE | Freq: Three times a day (TID) | ORAL | 0 refills | Status: DC

## 2017-07-19 MED ORDER — ENSURE ENLIVE PO LIQD: 237.0000 mL | Freq: Two times a day (BID) | ORAL | 12 refills | Status: DC

## 2017-07-19 MED ORDER — BUDESONIDE 0.5 MG/2ML IN SUSP: 0.5000 mg | Freq: Two times a day (BID) | RESPIRATORY_TRACT | 12 refills | Status: DC

## 2017-07-19 MED ORDER — CEFUROXIME AXETIL 500 MG PO TABS: 500.0000 mg | ORAL_TABLET | Freq: Two times a day (BID) | ORAL | 0 refills | Status: DC

## 2017-07-19 MED ORDER — METOPROLOL TARTRATE 25 MG PO TABS: 25.0000 mg | ORAL_TABLET | Freq: Two times a day (BID) | ORAL | 3 refills | Status: DC

## 2017-07-19 MED ORDER — LORAZEPAM 0.5 MG PO TABS: 0.5000 mg | ORAL_TABLET | Freq: Two times a day (BID) | ORAL | 0 refills | Status: DC | PRN

## 2017-07-19 MED ORDER — ADULT MULTIVITAMIN W/MINERALS CH: 1.0000 | ORAL_TABLET | Freq: Every day | ORAL | 0 refills | Status: DC

## 2017-07-19 NOTE — Progress Notes (Signed)
Late entry.  Patient was discharged with oxygen and instructions and her concentrator to her daughters waiting car.

## 2017-07-19 NOTE — Discharge Summary (Addendum)
[facility] NAME: Christina Hester    MR#:  [id number]  DATE OF BIRTH:  September 29, 1940  DATE OF ADMISSION:  07/14/2017 ADMITTING PHYSICIAN: Harrie Foreman, MD  DATE OF DISCHARGE: 07/19/2017  PRIMARY CARE PHYSICIAN: Lavera Guise, MD    ADMISSION DIAGNOSIS:  Hyponatremia [E87.1] Respiratory distress [R06.03] COPD exacerbation (HCC) [J44.1]  DISCHARGE DIAGNOSIS:  Acute on chronic hypoxic respiratory failure due to COPD/Emphysema flare Hypoxemia--now on oxygen Interstitial Pneumonia  SECONDARY DIAGNOSIS:   Past Medical History:  Diagnosis Date  . AAA (abdominal aortic aneurysm) ([place])    a. 05/2016 Abd U/S: 3.02 x 3.02 AAA.  Marland Kitchen CAD (coronary artery disease)    a. 02/2017 NSTEMI/Cath: LM nl, LAD mild dzs, D2 min irregs, LCX mild dzs, OM2 50ost, RCA 95p, 151m, L-->R collats, EF 55-65%.  Marland Kitchen COPD (chronic obstructive pulmonary disease) ([place])   . Diastolic dysfunction    a. 02/2017 Echo: EF 55-60%, gr1 DD, ? inf HK, mild MR.  Marland Kitchen Hyperlipidemia   . Hypertension   . PAD (peripheral artery disease) ([place])    a. 05/2016 ABI's: R 1.22, L 1.11.  . Tobacco abuse     HOSPITAL COURSE:   77 y/o female with CAD and COPD presents with SOB.  1. Acute hypoxic/hypercarbic resp failure with COPD exacerbation/PNA Patient weaned from BiPAP to nasal cannula and doing well. Pt desated to 87% on RA with ambulation and recovered to >92% after placing on oxygen Pt received IV solumederol,inhalers and nebs and still remains hypoxic requiring to be on oxygen at d/c -intolerant to po prednsione CT chest shows no pulmonary emboli Continue nebulized bronchodilators  Complted azithromycin and cont ceftin for 2 more days.  2. COPD acute exacerbation: recieved IV steroids Continue nebulizer treatment  3. CAP:  On abxs  4. Elevated troponin without chest pain with hx of CAD:  [facility] cardiology consultation appreciated . Elevation in troponins due  to demand ischemia. Patient has been ruled out for non-STEMI  Continue ASA, Plavix,metoprolol, NTG  5 PVD/AAA/bilateral CAS Follows with vascular surgery AAA is stable 3.0 cm  6. Hyponatremia, acute on chronic: Sodium level has improved with IV fluids Nephrology consultation appreciated Sodium stable and likely contributed by her chronic lung problem with emphysema  PT recommends HHPT Will set up oxygen for home. Pt agreeable aptt to se Dr Isidore Moos as out pt.  D/c home later today CONSULTS OBTAINED:  Treatment Team:  Murlean Iba, MD  DRUG ALLERGIES:   Allergies  Allergen Reactions  . Aspirin Tinitus  . Prednisone Other (See Comments)    Makes violent  . Sulfa Antibiotics Nausea And Vomiting  . Symbicort [Budesonide-Formoterol Fumarate] Other (See Comments)    Patient felt "out of her mind" and angry.    DISCHARGE MEDICATIONS:   Allergies as of 07/19/2017      Reactions   Aspirin Tinitus   Prednisone Other (See Comments)   Makes violent   Sulfa Antibiotics Nausea And Vomiting   Symbicort [budesonide-formoterol Fumarate] Other (See Comments)   Patient felt "out of her mind" and angry.      Medication List    TAKE these medications   amLODipine 5 MG tablet Commonly known as:  NORVASC Take 1 tablet (5 mg total) by mouth daily.   benzonatate 100 MG capsule Commonly known as:  TESSALON Take 1 capsule (100 mg total) by mouth 3 (three) times daily.   budesonide 0.5 MG/2ML nebulizer solution Commonly known as:  PULMICORT  Take 2 mLs (0.5 mg total) by nebulization 2 (two) times daily.   buPROPion 100 MG tablet Commonly known as:  WELLBUTRIN Take 100 mg by mouth daily.   cefUROXime 500 MG tablet Commonly known as:  CEFTIN Take 1 tablet (500 mg total) by mouth 2 (two) times daily with a meal.   clopidogrel 75 MG tablet Commonly known as:  PLAVIX Take 1 tablet (75 mg total) by mouth daily. What changed:  when to take this   feeding supplement (ENSURE  ENLIVE) Liqd Take 237 mLs by mouth 2 (two) times daily between meals.   guaiFENesin 600 MG 12 hr tablet Commonly known as:  MUCINEX Take 1 tablet (600 mg total) by mouth 2 (two) times daily.   ipratropium-albuterol 0.5-2.5 (3) MG/3ML Soln Commonly known as:  DUONEB Take 3 mLs by nebulization every 4 (four) hours. What changed:    when to take this  reasons to take this   LORazepam 0.5 MG tablet Commonly known as:  ATIVAN Take 1 tablet (0.5 mg total) by mouth 2 (two) times daily as needed for anxiety.   losartan 100 MG tablet Commonly known as:  COZAAR Take 1 tablet (100 mg total) by mouth daily.   Melatonin 5 MG Tabs Take 5 mg by mouth at bedtime as needed (sleep).   metoprolol tartrate 25 MG tablet Commonly known as:  LOPRESSOR Take 1 tablet (25 mg total) by mouth 2 (two) times daily.   multivitamin with minerals Tabs tablet Take 1 tablet by mouth daily.   nitroGLYCERIN 0.4 MG SL tablet Commonly known as:  NITROSTAT Place 1 tablet (0.4 mg total) under the tongue every 5 (five) minutes as needed for chest pain.   tiotropium 18 MCG inhalation capsule Commonly known as:  SPIRIVA Place 18 mcg into inhaler and inhale daily.            Durable Medical Equipment  (From admission, onward)        Start     Ordered   07/19/17 0936  DME Oxygen  Once    Question Answer Comment  Mode or (Route) Nasal cannula   Liters per Minute 2   Frequency Continuous (stationary and portable oxygen unit needed)   Oxygen conserving device Yes   Oxygen delivery system Gas      07/19/17 0935      If you experience worsening of your admission symptoms, develop shortness of breath, life threatening emergency, suicidal or homicidal thoughts you must seek medical attention immediately by calling 911 or calling your MD immediately  if symptoms less severe.  You Must read complete instructions/literature along with all the possible adverse reactions/side effects for all the Medicines  you take and that have been prescribed to you. Take any new Medicines after you have completely understood and accept all the possible adverse reactions/side effects.   Please note  You were cared for by a hospitalist during your hospital stay. If you have any questions about your discharge medications or the care you received while you were in the hospital after you are discharged, you can call the unit and asked to speak with the hospitalist on call if the hospitalist that took care of you is not available. Once you are discharged, your primary care physician will handle any further medical issues. Please note that NO REFILLS for any discharge medications will be authorized once you are discharged, as it is imperative that you return to your primary care physician (or establish a relationship with a primary  care physician if you do not have one) for your aftercare needs so that they can reassess your need for medications and monitor your lab values. Today   SUBJECTIVE   cough  VITAL SIGNS:  Blood pressure 125/62, pulse 91, temperature 98 F (36.7 C), temperature source Oral, resp. rate 18, height 5' (1.524 m), weight 41.1 kg (90 lb 9.7 oz), SpO2 99 %.  I/O:    Intake/Output Summary (Last 24 hours) at 07/19/2017 1108 Last data filed at 07/18/2017 1859 Gross per 24 hour  Intake 480 ml  Output -  Net 480 ml    PHYSICAL EXAMINATION:  GENERAL:  77 y.o.-year-old patient lying in the bed with no acute distress. Thin,cachectic EYES: Pupils equal, round, reactive to light and accommodation. No scleral icterus. Extraocular muscles intact.  HEENT: Head atraumatic, normocephalic. Oropharynx and nasopharynx clear.  NECK:  Supple, no jugular venous distention. No thyroid enlargement, no tenderness.  LUNGS: coarsel breath sounds bilaterally, no wheezing, rales,rhonchi or crepitation. No use of accessory muscles of respiration. Emphysematous chest CARDIOVASCULAR: S1, S2 normal. No murmurs, rubs, or  gallops.  ABDOMEN: Soft, non-tender, non-distended. Bowel sounds present. No organomegaly or mass.  EXTREMITIES: No pedal edema, cyanosis, or clubbing.  NEUROLOGIC: Cranial nerves II through XII are intact. Muscle strength 5/5 in all extremities. Sensation intact. Gait not checked.  PSYCHIATRIC: The patient is alert and oriented x 3.  SKIN: No obvious rash, lesion, or ulcer.   DATA REVIEW:   CBC  Recent Labs  Lab 07/16/17 0124  WBC 14.0*  HGB 10.4*  HCT 30.4*  PLT 241    Chemistries  Recent Labs  Lab 07/14/17 0311  07/16/17 0124  07/19/17 0449  NA 118*   < > 129*   < > 129*  K 3.9   < > 4.2   < > 4.9  CL 86*   < > 93*   < > 90*  CO2 21*   < > 27   < > 30  GLUCOSE 129*   < > 106*   < > 131*  BUN 10   < > 28*   < > 30*  CREATININE 0.60   < > 0.72   < > 0.73  CALCIUM 8.6*   < > 8.5*   < > 8.8*  MG  --    < > 1.9  --   --   AST 30  --   --   --   --   ALT 16  --   --   --   --   ALKPHOS 81  --   --   --   --   BILITOT 0.8  --   --   --   --    < > = values in this interval not displayed.    Microbiology Results   Recent Results (from the past 240 hour(s))  MRSA PCR Screening     Status: None   Collection Time: 07/14/17  6:11 PM  Result Value Ref Range Status   MRSA by PCR NEGATIVE NEGATIVE Final    Comment:        The GeneXpert MRSA Assay (FDA approved for NASAL specimens only), is one component of a comprehensive MRSA colonization surveillance program. It is not intended to diagnose MRSA infection nor to guide or monitor treatment for MRSA infections. Performed at [facility], [address] [place], [place] [address]     RADIOLOGY:  No results found.   Management plans discussed  with the patient, family and they are in agreement.  CODE STATUS:     Code Status Orders  (From admission, onward)        Start     Ordered   07/14/17 0930  Full code  Continuous     07/14/17 0929    Code Status History    Date Active Date Inactive  Code Status Order ID Comments User Context   03/05/2017 12:39 03/06/2017 15:57 Full Code [id number]  Wellington Hampshire, MD Inpatient   07/18/2016 18:24 07/20/2016 20:41 Full Code [id number]  Vaughan Basta, MD ED   04/28/2016 01:56 04/29/2016 13:28 Full Code [id number]  Harvie Bridge, DO Inpatient   11/25/2015 19:33 11/26/2015 15:19 Full Code [id number]  Nicholes Mango, MD Inpatient      TOTAL TIME TAKING CARE OF THIS PATIENT: **40* minutes.    Fritzi Mandes M.D on 07/19/2017 at 11:08 AM  Between 7am to 6pm - Pager - [phone] After 6pm go to [email or web address] - password [email or web address]  Office  [phone]  CC: Primary care physician; Lavera Guise, MD

## 2017-07-19 NOTE — Care Management (Signed)
Patient to discharge home today.  Patient qualifies for Home O2.  Home health orders have been written for RN, PT, and respiratory care, as well as COPD protocol.  Patient was provided with home health agency preference.  Patient states that she does not have a preference of agnecy.   Referral made to [facility] with [place].  Portable O2 tank to delivered prior to discharge.  RNCM signing off.

## 2017-07-19 NOTE — Progress Notes (Signed)
[facility], [place] 07/19/17  Subjective:  Patient sitting up in bed at the moment. Overall serum sodium relatively stable and currently 129.   Objective:  Vital signs in last 24 hours:  Temp:  [98 F (36.7 C)-98.8 F (37.1 C)] 98 F (36.7 C) (01/03 1207) Pulse Rate:  [66-92] 92 (01/03 1207) Resp:  [17-20] 17 (01/03 1207) BP: (118-133)/(53-63) 124/63 (01/03 1207) SpO2:  [97 %-99 %] 98 % (01/03 1207) Weight:  [41.1 kg (90 lb 9.7 oz)] 41.1 kg (90 lb 9.7 oz) (01/03 0627)  Weight change:  Filed Weights   07/16/17 0500 07/17/17 0500 07/19/17 0627  Weight: 41.1 kg (90 lb 9.7 oz) 40.8 kg (89 lb 15.2 oz) 41.1 kg (90 lb 9.7 oz)    Intake/Output:    Intake/Output Summary (Last 24 hours) at 07/19/2017 1309 Last data filed at 07/19/2017 1100 Gross per 24 hour  Intake 600 ml  Output 0 ml  Net 600 ml     Physical Exam: General: NAD, sitting up in bed  HEENT Moist oral mucus membranes  Neck supple   Pulm/lungs decreased breath sounds at bases, normal effort  CVS/Heart Regular, no rub  Abdomen:  Soft, NTND, BS present  Extremities: No edema  Neurologic: Alert, able to answer questions  Skin: No acute rashes          Basic Metabolic Panel:  Recent Labs  Lab 07/15/17 0358 07/16/17 0124 07/17/17 0329 07/18/17 0523 07/19/17 0449  NA 127* 129* 129* 131* 129*  K 4.6 4.2 4.4 4.2 4.9  CL 98* 93* 89* 90* 90*  CO2 24 27 31  32 30  GLUCOSE 124* 106* 115* 115* 131*  BUN 17 28* 20 19 30*  CREATININE 0.52 0.72 0.54 0.75 0.73  CALCIUM 8.6* 8.5* 9.1 9.2 8.8*  MG 1.8 1.9  --   --   --   PHOS 2.9  --   --   --   --      CBC: Recent Labs  Lab 07/14/17 0311 07/16/17 0124  WBC 12.5* 14.0*  NEUTROABS 10.0*  --   HGB 11.6* 10.4*  HCT 33.0* 30.4*  MCV 89.1 91.1  PLT 239 241     No results found for: HEPBSAG, HEPBSAB, HEPBIGM    Microbiology:  Recent Results (from the past 240 hour(s))  MRSA PCR Screening     Status: None   Collection  Time: 07/14/17  6:11 PM  Result Value Ref Range Status   MRSA by PCR NEGATIVE NEGATIVE Final    Comment:        The GeneXpert MRSA Assay (FDA approved for NASAL specimens only), is one component of a comprehensive MRSA colonization surveillance program. It is not intended to diagnose MRSA infection nor to guide or monitor treatment for MRSA infections. Performed at [facility], [address] [place], [place] [address]     Coagulation Studies: No results for input(s): LABPROT, INR in the last 72 hours.  Urinalysis: No results for input(s): COLORURINE, LABSPEC, PHURINE, GLUCOSEU, HGBUR, BILIRUBINUR, KETONESUR, PROTEINUR, UROBILINOGEN, NITRITE, LEUKOCYTESUR in the last 72 hours.  Invalid input(s): APPERANCEUR    Imaging: No results found.   Medications:    . amLODipine  5 mg Oral Daily  . benzonatate  100 mg Oral TID  . budesonide (PULMICORT) nebulizer solution  0.5 mg Nebulization BID  . buPROPion  100 mg Oral Daily  . cefUROXime  500 mg Oral BID WC  . chlorhexidine  15 mL Mouth Rinse BID  .  clopidogrel  75 mg Oral QHS  . docusate sodium  100 mg Oral BID  . enoxaparin (LOVENOX) injection  30 mg Subcutaneous Q24H  . feeding supplement (ENSURE ENLIVE)  237 mL Oral BID BM  . ipratropium-albuterol  3 mL Nebulization Q4H  . losartan  100 mg Oral Daily  . mouth rinse  15 mL Mouth Rinse q12n4p  . metoprolol tartrate  25 mg Oral BID  . multivitamin with minerals  1 tablet Oral Daily  . senna  1 tablet Oral Daily  . tiotropium  18 mcg Inhalation q morning - 10a   acetaminophen **OR** acetaminophen, albuterol, guaiFENesin-dextromethorphan, LORazepam, LORazepam, Melatonin, morphine injection, nitroGLYCERIN, ondansetron **OR** ondansetron (ZOFRAN) IV  Assessment/ Plan:  77 y.o.caucasian female with coronary artery disease, AAA, hypertension, COPD, was admitted on 07/14/2017 with COPD exacerbation and is found to have worsening hyponatremia  1.  Chronic  hyponatremia with acute exacerbation -serum sodium appears to be close to baseline.  Currently serum sodium was 129.  We recommend continued monitoring of his sugars sodium as an outpatient.  We counseled the patient to limit her water intake to 1200 cc per 24 hours.  She verbalized understanding of this.  2. SOB  - likely combination of COPD exacerbation and acute pulmonary edema -Continue fluid restriction at home as above.        LOS: [place] 1/3/20191:09 PM  [place], [place]

## 2017-07-19 NOTE — Progress Notes (Signed)
SATURATION QUALIFICATIONS: (This note is used to comply with regulatory documentation for home oxygen)  Patient Saturations on Room Air at Rest = 90%  Patient Saturations on Room Air while Ambulating = 87%  Patient Saturations on 2 Liters of oxygen while Ambulating = 92%  Please briefly explain why patient needs home oxygen: Oxygen dropped to 87% when patient did not have oxygen on

## 2017-07-19 NOTE — Progress Notes (Signed)
Pharmacy Antibiotic Note  Christina Hester is a 77 y.o. female admitted on 07/14/2017 with CAP. Pharmacy has been consulted for ceftriaxone dosing Plan: Continue ceftriaxone 1g IV Q24hr for total of 7 days of therapy.  Height: 5' (152.4 cm) Weight: 90 lb 9.7 oz (41.1 kg) IBW/kg (Calculated) : 45.5  Temp (24hrs), Avg:98.3 F (36.8 C), Min:98 F (36.7 C), Max:98.8 F (37.1 C)  Recent Labs  Lab 07/14/17 0311  07/15/17 0358 07/16/17 0124 07/17/17 0329 07/18/17 0523 07/19/17 0449  WBC 12.5*  --   --  14.0*  --   --   --   CREATININE 0.60   < > 0.52 0.72 0.54 0.75 0.73   < > = values in this interval not displayed.    Estimated Creatinine Clearance: 39.4 mL/min (by C-G formula based on SCr of 0.73 mg/dL).    Allergies  Allergen Reactions  . Aspirin Tinitus  . Prednisone Other (See Comments)    Makes violent  . Sulfa Antibiotics Nausea And Vomiting  . Symbicort [Budesonide-Formoterol Fumarate] Other (See Comments)    Patient felt "out of her mind" and angry.    Antimicrobials this admission: Azithromycin 12/29 >> 1/2 Ceftriaxone 12/29 >> 1/4  Dose adjustments this admission:  Microbiology results: 12/29 MRSA PCR: negative.   Thank you for allowing pharmacy to be a part of this patient's care.  Caffie Sotto D 07/19/2017 8:46 AM

## 2017-07-19 NOTE — Progress Notes (Signed)
Patient ID: Christina Hester, female   DOB: [birth year], 77 y.o.   MRN: [id number] Patient is requesting to go home with Ativan since it helps her with anxiety after a coughing spell. Will give her 0.5 mg bid prn #15 pills

## 2017-07-19 NOTE — Progress Notes (Signed)
Physical Therapy Treatment Patient Details Name: Christina Hester MRN: [id number] DOB: 1941-06-12 Today's Date: 07/19/2017    History of Present Illness 77 y/o female admitted with hyponatremia.  past medical history of coronary artery disease, stable AAA, hypertension and COPD presents to [facility] complaining of shortness of breath.      PT Comments    Pt in bed on 2 lpm O2 97% at rest.  Pt stated she did not wear oxygen at home and "I'm not going home with it"  Discussed with primary nurse.  O2 was removed to check response and for qualifying O2 sats if needed.  Pt was able to ambulate with an overall slow gait with decreased step height and length.  Sats generally remained 88-91 with gait.  Pt talking frequently during gait and O2 would decrease to lower end and pt needed verbal cues to stop talking and for proper breathing.  With focus on breathing during gait, sats would remain 90-91%.  At end of gait, despite breathing and no talking, sats were 87%.  O2 was replaced upon return to room at 2 lpm.    Gait is unsteady at times.  Pt stated at home she uses objects and walls for support and is generally cautious with turns.  Pt stated she does have a rollator at home.  She was encouraged to use it while walking in community or outdoors for safety, and energy conservation.    Follow Up Recommendations  Home health PT     Equipment Recommendations  None recommended by PT    Recommendations for Other Services       Precautions / Restrictions Precautions Precautions: Fall Restrictions Weight Bearing Restrictions: No    Mobility  Bed Mobility Overal bed mobility: Independent                Transfers Overall transfer level: Modified independent Equipment used: None                Ambulation/Gait Ambulation/Gait assistance: Supervision Ambulation Distance (Feet): 160 Feet Assistive device: None Gait Pattern/deviations: Step-to pattern;Decreased step length -  right;Decreased step length - left   Gait velocity interpretation: Below normal speed for age/gender General Gait Details: slight unsteadiness with slow cautious steps   Stairs            Wheelchair Mobility    Modified Rankin (Stroke Patients Only)       Balance Overall balance assessment: Modified Independent                                          Cognition Arousal/Alertness: Awake/alert Behavior During Therapy: WFL for tasks assessed/performed Overall Cognitive Status: Within Functional Limits for tasks assessed                                        Exercises      General Comments        Pertinent Vitals/Pain Pain Assessment: No/denies pain    Home Living                      Prior Function            PT Goals (current goals can now be found in the care plan section) Progress towards PT goals: Progressing toward goals  Frequency    Min 2X/week      PT Plan Current plan remains appropriate    Co-evaluation              AM-PAC PT "6 Clicks" Daily Activity  Outcome Measure  Difficulty turning over in bed (including adjusting bedclothes, sheets and blankets)?: None Difficulty moving from lying on back to sitting on the side of the bed? : None Difficulty sitting down on and standing up from a chair with arms (e.g., wheelchair, bedside commode, etc,.)?: None Help needed moving to and from a bed to chair (including a wheelchair)?: A Little Help needed walking in hospital room?: A Little Help needed climbing 3-5 steps with a railing? : A Little 6 Click Score: 21    End of Session Equipment Utilized During Treatment: Gait belt Activity Tolerance: Patient tolerated treatment well;Patient limited by fatigue Patient left: in bed;with bed alarm set;with call bell/phone within reach Nurse Communication: Mobility status;Other (comment)       Time: 5465-6812 PT Time Calculation (min) (ACUTE  ONLY): 18 min  Charges:  $Gait Training: 8-22 mins                    G Codes:       Chesley Noon, PTA 07/19/17, 9:19 AM

## 2017-07-20 DIAGNOSIS — J441 Chronic obstructive pulmonary disease with (acute) exacerbation: Secondary | ICD-10-CM | POA: Diagnosis not present

## 2017-07-20 DIAGNOSIS — I2510 Atherosclerotic heart disease of native coronary artery without angina pectoris: Secondary | ICD-10-CM | POA: Diagnosis not present

## 2017-07-20 DIAGNOSIS — Z7951 Long term (current) use of inhaled steroids: Secondary | ICD-10-CM | POA: Diagnosis not present

## 2017-07-20 DIAGNOSIS — F17210 Nicotine dependence, cigarettes, uncomplicated: Secondary | ICD-10-CM | POA: Diagnosis not present

## 2017-07-20 DIAGNOSIS — E785 Hyperlipidemia, unspecified: Secondary | ICD-10-CM | POA: Diagnosis not present

## 2017-07-20 DIAGNOSIS — J9611 Chronic respiratory failure with hypoxia: Secondary | ICD-10-CM | POA: Diagnosis not present

## 2017-07-20 DIAGNOSIS — Z9181 History of falling: Secondary | ICD-10-CM | POA: Diagnosis not present

## 2017-07-20 DIAGNOSIS — I10 Essential (primary) hypertension: Secondary | ICD-10-CM | POA: Diagnosis not present

## 2017-07-20 DIAGNOSIS — I739 Peripheral vascular disease, unspecified: Secondary | ICD-10-CM | POA: Diagnosis not present

## 2017-07-20 DIAGNOSIS — M6281 Muscle weakness (generalized): Secondary | ICD-10-CM | POA: Diagnosis not present

## 2017-07-20 DIAGNOSIS — Z9981 Dependence on supplemental oxygen: Secondary | ICD-10-CM | POA: Diagnosis not present

## 2017-07-20 DIAGNOSIS — R2681 Unsteadiness on feet: Secondary | ICD-10-CM | POA: Diagnosis not present

## 2017-07-23 DIAGNOSIS — I2510 Atherosclerotic heart disease of native coronary artery without angina pectoris: Secondary | ICD-10-CM | POA: Diagnosis not present

## 2017-07-23 DIAGNOSIS — M6281 Muscle weakness (generalized): Secondary | ICD-10-CM | POA: Diagnosis not present

## 2017-07-23 DIAGNOSIS — I10 Essential (primary) hypertension: Secondary | ICD-10-CM | POA: Diagnosis not present

## 2017-07-23 DIAGNOSIS — J441 Chronic obstructive pulmonary disease with (acute) exacerbation: Secondary | ICD-10-CM | POA: Diagnosis not present

## 2017-07-23 DIAGNOSIS — J9611 Chronic respiratory failure with hypoxia: Secondary | ICD-10-CM | POA: Diagnosis not present

## 2017-07-23 DIAGNOSIS — R2681 Unsteadiness on feet: Secondary | ICD-10-CM | POA: Diagnosis not present

## 2017-07-24 ENCOUNTER — Telehealth: Payer: Self-pay

## 2017-07-24 NOTE — Telephone Encounter (Signed)
Spoke to Christina Hester and gave verbal ok for pt to have PT 2 x wk for 4 wks for balance, gait and strengthening.

## 2017-07-24 NOTE — Telephone Encounter (Signed)
[facility] called asking for verbal orders for skilled nursing for 2 x wk for 2 wks and 1 x wk for 6 wks.  Gave verbal ok to go ahead.  dbs

## 2017-07-25 ENCOUNTER — Encounter: Payer: Self-pay | Admitting: Internal Medicine

## 2017-07-25 ENCOUNTER — Ambulatory Visit (INDEPENDENT_AMBULATORY_CARE_PROVIDER_SITE_OTHER): Payer: Medicare Other | Admitting: Internal Medicine

## 2017-07-25 VITALS — BP 141/61 | HR 74 | Resp 16 | Ht 60.0 in | Wt 84.6 lb

## 2017-07-25 VITALS — BP 138/78 | HR 83 | Resp 16 | Ht 60.0 in | Wt 83.0 lb

## 2017-07-25 DIAGNOSIS — J441 Chronic obstructive pulmonary disease with (acute) exacerbation: Secondary | ICD-10-CM

## 2017-07-25 DIAGNOSIS — J449 Chronic obstructive pulmonary disease, unspecified: Secondary | ICD-10-CM | POA: Insufficient documentation

## 2017-07-25 DIAGNOSIS — I10 Essential (primary) hypertension: Secondary | ICD-10-CM | POA: Insufficient documentation

## 2017-07-25 DIAGNOSIS — E860 Dehydration: Secondary | ICD-10-CM

## 2017-07-25 DIAGNOSIS — E871 Hypo-osmolality and hyponatremia: Secondary | ICD-10-CM | POA: Diagnosis not present

## 2017-07-25 MED ORDER — TIOTROPIUM BROMIDE MONOHYDRATE 18 MCG IN CAPS: 18.0000 ug | ORAL_CAPSULE | Freq: Every day | RESPIRATORY_TRACT | 12 refills | Status: DC

## 2017-07-25 MED ORDER — AZITHROMYCIN 250 MG PO TABS: ORAL_TABLET | ORAL | 5 refills | Status: DC

## 2017-07-25 MED ORDER — FLUTICASONE PROPIONATE HFA 110 MCG/ACT IN AERO: 2.0000 | INHALATION_SPRAY | Freq: Two times a day (BID) | RESPIRATORY_TRACT | 12 refills | Status: DC

## 2017-07-25 MED ORDER — LISINOPRIL 5 MG PO TABS: 2.5000 mg | ORAL_TABLET | Freq: Every day | ORAL | 3 refills | Status: DC

## 2017-07-25 NOTE — Patient Instructions (Addendum)
Restart spiriva.  Start azithromycin 250 mg every Monday, Wednesday, Friday. Use nebulizer treatments once or twice a day as needed.  If your breathing is okay you do not need to use them.  I will check an overnight oxygen test to see if you still require oxygen at night.

## 2017-07-25 NOTE — Progress Notes (Signed)
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   COPD/emphysema. -We will restart patient's Spiriva, she is asked to use it once a day.  She becomes very emotional with steroids even inhaled steroids, therefore will avoid these medications. -Patient instructed to use nebulizer treatments only as needed. -Given her numerous exacerbations in the last year she was started on azithromycin every Monday, Wednesday, Friday.  We discussed potential side effects particular gastrointestinal upset.  Meds ordered this encounter  Medications  . tiotropium (SPIRIVA HANDIHALER) 18 MCG inhalation capsule    Sig: Place 1 capsule (18 mcg total) into inhaler and inhale daily.    Dispense:  30 capsule    Refill:  12  . azithromycin (ZITHROMAX) 250 MG tablet    Sig: Take every Monday, Wednesday, Friday.    Dispense:  13 each    Refill:  5   Orders Placed This Encounter  Procedures  . Pulse oximetry, overnight    On room air.    Standing Status:   Future    Standing Expiration Date:   07/25/2018    Scheduling Instructions:     On RA   Return in about 6 months (around 01/22/2018).   Date: 07/25/2017  MRN# [id number] Christina Hester [birth year]   Christina Hester is a 77 y.o. old female seen in follow up for chief complaint of  Chief Complaint  Patient presents with  . Hospitalization Follow-up    Pt admitted 12/29 and d/c 07/19/17 for COPD exacerbation/respiratory distress  . Shortness of Breath    with exertion  . Cough    improved on tessalon perles     HPI:   Patient is 77 year old female recently admitted to the hospital from 07/14/17 to 07/19/17 for COPD exacerbation, pneumonia.  History is also provided by her daughter who is present and very emotional at this time. She was discharged with oxygen at 2L.Marland Kitchen Her breathing is almost back to normal. She ran our of spiriva and is not on it.  She was using nebs 4 times  per day and thinks it helps. She stopped pulmicort due to cost. This was the 3rd exacerbation in the past year. She was started on symbicort in the past but it made her very emotional.  In general she gets very angry and emotional when she is on steroids. She tells me that her breathing is generally okay, she is able to do her usual activities of daily living such as shopping, doing laundry, bathing and dressing without being limited by her breathing.  Her main concern is that she has had multiple exacerbations in the last year.  Desat 07/25/17; on RA at rest sat was 98% and HR 81. After walking 180 sat was 97% and HR 87.  Imaging personally reviewed, CT chest 07/14/17, diffuse emphysema.  Results for Christina Hester (MRN [id number]) as of 07/25/2017 08:25  Ref. Range 07/15/2017 20:09  Sample type Unknown ARTERIAL DRAW  Delivery systems Unknown BILEVEL POSITIVE .Marland KitchenMarland Kitchen  FIO2 Unknown 0.40  Inspiratory PAP Unknown 12  Expiratory PAP Unknown 6  pH, Arterial Latest Ref Range: 7.350 - 7.450  7.36  pCO2 arterial Latest Ref Range: 32.0 - 48.0 mmHg 48  pO2, Arterial Latest Ref Range: 83.0 - 108.0 mmHg 130 (H)  Acid-Base Excess Latest Ref Range: 0.0 - 2.0 mmol/L 1.1  Bicarbonate Latest Ref Range: 20.0 - 28.0 mmol/L 27.1  O2 Saturation Latest Units: % 98.8  Patient temperature  Unknown 37.0       Medication:    Current Outpatient Medications:  .  amLODipine (NORVASC) 5 MG tablet, Take 1 tablet (5 mg total) by mouth daily., Disp: 30 tablet, Rfl: 3 .  benzonatate (TESSALON) 100 MG capsule, Take 1 capsule (100 mg total) by mouth 3 (three) times daily., Disp: 20 capsule, Rfl: 0 .  budesonide (PULMICORT) 0.5 MG/2ML nebulizer solution, Take 2 mLs (0.5 mg total) by nebulization 2 (two) times daily., Disp: 30 mL, Rfl: 12 .  buPROPion (WELLBUTRIN) 100 MG tablet, Take 100 mg by mouth daily., Disp: , Rfl:  .  cefUROXime (CEFTIN) 500 MG tablet, Take 1 tablet (500 mg total) by mouth 2 (two) times daily with a  meal., Disp: 4 tablet, Rfl: 0 .  clopidogrel (PLAVIX) 75 MG tablet, Take 1 tablet (75 mg total) by mouth daily. (Patient taking differently: Take 75 mg by mouth at bedtime. ), Disp: 90 tablet, Rfl: 3 .  feeding supplement, ENSURE ENLIVE, (ENSURE ENLIVE) LIQD, Take 237 mLs by mouth 2 (two) times daily between meals., Disp: 237 mL, Rfl: 12 .  fluticasone (FLOVENT HFA) 110 MCG/ACT inhaler, Inhale 2 puffs into the lungs 2 (two) times daily., Disp: 1 Inhaler, Rfl: 12 .  guaiFENesin (MUCINEX) 600 MG 12 hr tablet, Take 1 tablet (600 mg total) by mouth 2 (two) times daily., Disp: 20 tablet, Rfl: 0 .  ipratropium-albuterol (DUONEB) 0.5-2.5 (3) MG/3ML SOLN, Take 3 mLs by nebulization every 4 (four) hours. (Patient taking differently: Take 3 mLs by nebulization every 4 (four) hours as needed. ), Disp: 360 mL, Rfl: 3 .  lisinopril (PRINIVIL,ZESTRIL) 5 MG tablet, Take 0.5 tablets (2.5 mg total) by mouth daily., Disp: 90 tablet, Rfl: 3 .  LORazepam (ATIVAN) 0.5 MG tablet, Take 1 tablet (0.5 mg total) by mouth 2 (two) times daily as needed for anxiety., Disp: 15 tablet, Rfl: 0 .  Melatonin 5 MG TABS, Take 5 mg by mouth at bedtime as needed (sleep)., Disp: , Rfl:  .  metoprolol tartrate (LOPRESSOR) 25 MG tablet, Take 1 tablet (25 mg total) by mouth 2 (two) times daily., Disp: 60 tablet, Rfl: 3 .  Multiple Vitamin (MULTIVITAMIN WITH MINERALS) TABS tablet, Take 1 tablet by mouth daily., Disp: 30 tablet, Rfl: 0 .  nitroGLYCERIN (NITROSTAT) 0.4 MG SL tablet, Place 1 tablet (0.4 mg total) under the tongue every 5 (five) minutes as needed for chest pain., Disp: 20 tablet, Rfl: 12 .  tiotropium (SPIRIVA) 18 MCG inhalation capsule, Place 18 mcg into inhaler and inhale daily., Disp: , Rfl:  .  losartan (COZAAR) 100 MG tablet, Take 1 tablet (100 mg total) by mouth daily., Disp: 90 tablet, Rfl: 3   Allergies:  Aspirin; Prednisone; Sulfa antibiotics; and Symbicort [budesonide-formoterol fumarate]  Review of Systems: Gen:   Denies  fever, sweats. HEENT: Denies blurred vision. Cvc:  No dizziness, chest pain or heaviness Resp:   Denies cough or sputum porduction. Gi: Denies swallowing difficulty, stomach pain. constipation, bowel incontinence Gu:  Denies bladder incontinence, burning urine Ext:   No Joint pain, stiffness. Skin: No skin rash, easy bruising. Endoc:  No polyuria, polydipsia. Psych: No depression, insomnia. Other:  All other systems were reviewed and found to be negative other than what is mentioned in the HPI.   Physical Examination:   VS: BP 138/78 (BP Location: Right Arm, Cuff Size: Normal)   Pulse 83   Resp 16   Ht 5' (1.524 m)   Wt 83 lb (37.6 kg)  SpO2 95%   BMI 16.21 kg/m   General Appearance: No distress  Neuro:without focal findings,  speech normal,  HEENT: PERRLA, EOM intact. Pulmonary: normal breath sounds, No wheezing.   CardiovascularNormal S1,S2.  No m/r/g.   Abdomen: Benign, Soft, non-tender. Renal:  No costovertebral tenderness  GU:  Not performed at this time. Endoc: No evident thyromegaly, no signs of acromegaly. Skin:   warm, no rash. Extremities: normal, no cyanosis, clubbing.   LABORATORY PANEL:   CBC No results for input(s): WBC, HGB, HCT, PLT in the last 168 hours. ------------------------------------------------------------------------------------------------------------------  Chemistries  Recent Labs  Lab 07/19/17 0449  NA 129*  K 4.9  CL 90*  CO2 30  GLUCOSE 131*  BUN 30*  CREATININE 0.73  CALCIUM 8.8*   ------------------------------------------------------------------------------------------------------------------  Cardiac Enzymes No results for input(s): TROPONINI in the last 168 hours. ------------------------------------------------------------  RADIOLOGY:   No results found for this or any previous visit. Results for orders placed during the hospital encounter of 03/02/17  DG Chest 2 View   Narrative CLINICAL DATA:  Acute  onset of shortness of breath and left chest pain, radiating to the back. Nausea and vomiting. Initial encounter.  EXAM: CHEST  2 VIEW  COMPARISON:  Chest radiograph and CTA of the chest performed 01/12/2017  FINDINGS: The lungs are well-aerated. Peribronchial thickening is noted. There is no evidence of focal opacification, pleural effusion or pneumothorax.  The heart is normal in size; the mediastinal contour is within normal limits. No acute osseous abnormalities are seen.  IMPRESSION: Peribronchial thickening noted.  Lungs otherwise grossly clear.   Electronically Signed   By: Garald Balding M.D.   On: 03/02/2017 00:14    ------------------------------------------------------------------------------------------------------------------  Thank  you for allowing [facility] to assist in the care of your patient. Our recommendations are noted above.  Please contact us if we can be of further service.   Marda Stalker, MD.  [facility] Office Number: [phone]  Patricia Pesa, M.D.  Merton Border, M.D  07/25/2017

## 2017-07-25 NOTE — Progress Notes (Signed)
[facility] [place], [place] [address]  Internal MEDICINE  Office Visit Note  Patient Name: Christina Hester  [id number]  [id number]  Date of Service: 07/25/2017     Complaints/HPI Pt is here for recent hospital follow up. Chief Complaint  Patient presents with  . Hospitalization Follow-up    breathing issues   Multiple problems, BP 3 different medications Will see new pulmonary. Was started on 02    Other  This is a chronic problem. The current episode started more than 1 month ago. The problem occurs 2 to 4 times per day. Associated symptoms include coughing (Shortness of breath). Pertinent negatives include no abdominal pain, arthralgias, chest pain, chills, congestion, fatigue, joint swelling, nausea, neck pain, numbness, rash, sore throat or vomiting. The symptoms are aggravated by exertion and coughing.    Current Medication: Outpatient Encounter Medications as of 07/25/2017  Medication Sig  . amLODipine (NORVASC) 5 MG tablet Take 1 tablet (5 mg total) by mouth daily.  . benzonatate (TESSALON) 100 MG capsule Take 1 capsule (100 mg total) by mouth 3 (three) times daily.  . budesonide (PULMICORT) 0.5 MG/2ML nebulizer solution Take 2 mLs (0.5 mg total) by nebulization 2 (two) times daily.  Marland Kitchen buPROPion (WELLBUTRIN) 100 MG tablet Take 100 mg by mouth daily.  . cefUROXime (CEFTIN) 500 MG tablet Take 1 tablet (500 mg total) by mouth 2 (two) times daily with a meal.  . clopidogrel (PLAVIX) 75 MG tablet Take 1 tablet (75 mg total) by mouth daily. (Patient taking differently: Take 75 mg by mouth at bedtime. )  . feeding supplement, ENSURE ENLIVE, (ENSURE ENLIVE) LIQD Take 237 mLs by mouth 2 (two) times daily between meals.  Marland Kitchen guaiFENesin (MUCINEX) 600 MG 12 hr tablet Take 1 tablet (600 mg total) by mouth 2 (two) times daily. (Patient not taking: Reported on 07/14/2017)  . ipratropium-albuterol (DUONEB) 0.5-2.5 (3) MG/3ML SOLN Take 3 mLs by nebulization every  4 (four) hours. (Patient taking differently: Take 3 mLs by nebulization every 4 (four) hours as needed. )  . LORazepam (ATIVAN) 0.5 MG tablet Take 1 tablet (0.5 mg total) by mouth 2 (two) times daily as needed for anxiety.  Marland Kitchen losartan (COZAAR) 100 MG tablet Take 1 tablet (100 mg total) by mouth daily.  . Melatonin 5 MG TABS Take 5 mg by mouth at bedtime as needed (sleep).  . metoprolol tartrate (LOPRESSOR) 25 MG tablet Take 1 tablet (25 mg total) by mouth 2 (two) times daily.  . Multiple Vitamin (MULTIVITAMIN WITH MINERALS) TABS tablet Take 1 tablet by mouth daily.  . nitroGLYCERIN (NITROSTAT) 0.4 MG SL tablet Place 1 tablet (0.4 mg total) under the tongue every 5 (five) minutes as needed for chest pain.  Marland Kitchen tiotropium (SPIRIVA) 18 MCG inhalation capsule Place 18 mcg into inhaler and inhale daily.   No facility-administered encounter medications on file as of 07/25/2017.     Surgical History: Past Surgical History:  Procedure Laterality Date  . ABDOMINAL HYSTERECTOMY    . BREAST EXCISIONAL BIOPSY Left 20+ YRS AGO   NEG  . LEFT HEART CATH Bilateral 03/05/2017   Procedure: Left Heart Cath with poss PCI;  Surgeon: Wellington Hampshire, MD;  Location: [facility];  Service: Cardiovascular;  Laterality: Bilateral;    Medical History: Past Medical History:  Diagnosis Date  . AAA (abdominal aortic aneurysm) ([place])    a. 05/2016 Abd U/S: 3.02 x 3.02 AAA.  Marland Kitchen CAD (coronary artery disease)    a.  02/2017 NSTEMI/Cath: LM nl, LAD mild dzs, D2 min irregs, LCX mild dzs, OM2 50ost, RCA 95p, 170m, L-->R collats, EF 55-65%.  Marland Kitchen COPD (chronic obstructive pulmonary disease) ([place])   . Diastolic dysfunction    a. 02/2017 Echo: EF 55-60%, gr1 DD, ? inf HK, mild MR.  Marland Kitchen Hyperlipidemia   . Hypertension   . PAD (peripheral artery disease) ([place])    a. 05/2016 ABI's: R 1.22, L 1.11.  . Tobacco abuse     Family History: Family History  Problem Relation Age of Onset  . Breast cancer Mother   . Breast cancer  Maternal Aunt   . Breast cancer Maternal Aunt   . Breast cancer Maternal Aunt     Social History   Socioeconomic History  . Marital status: Divorced    Spouse name: Not on file  . Number of children: Not on file  . Years of education: Not on file  . Highest education level: Not on file  Social Needs  . Financial resource strain: Not on file  . Food insecurity - worry: Not on file  . Food insecurity - inability: Not on file  . Transportation needs - medical: Not on file  . Transportation needs - non-medical: Not on file  Occupational History  . Not on file  Tobacco Use  . Smoking status: Former Smoker    Packs/day: 0.25    Last attempt to quit: 07/12/2017    Years since quitting: 0.0  . Smokeless tobacco: Never Used  . Tobacco comment: off and on; not every day.  Substance and Sexual Activity  . Alcohol use: No  . Drug use: No  . Sexual activity: Not on file  Other Topics Concern  . Not on file  Social History Narrative  . Not on file      Review of Systems  Constitutional: Negative for chills, fatigue and unexpected weight change.  HENT: Positive for postnasal drip. Negative for congestion, rhinorrhea, sneezing and sore throat.   Eyes: Negative for redness.  Respiratory: Positive for cough (Shortness of breath) and shortness of breath. Negative for chest tightness.   Cardiovascular: Negative for chest pain and palpitations.  Gastrointestinal: Negative for abdominal pain, constipation, diarrhea, nausea and vomiting.  Genitourinary: Negative for dysuria and frequency.  Musculoskeletal: Negative for arthralgias, back pain, joint swelling and neck pain.  Skin: Negative for rash.  Neurological: Negative.  Negative for tremors and numbness.  Hematological: Negative for adenopathy. Does not bruise/bleed easily.  Psychiatric/Behavioral: Negative for behavioral problems (Depression), sleep disturbance and suicidal ideas. The patient is not nervous/anxious.     Vital  Signs: BP (!) 141/61 (BP Location: Left Arm, Patient Position: Sitting)   Pulse 74   Resp 16   Ht 5' (1.524 m)   Wt 84 lb 9.6 oz (38.4 kg)   SpO2 94%   BMI 16.52 kg/m    Physical Exam  Constitutional: She is oriented to person, place, and time. She appears well-developed and well-nourished. No distress.  HENT:  Head: Normocephalic and atraumatic.  Mouth/Throat: Oropharynx is clear and moist. No oropharyngeal exudate.  Eyes: EOM are normal. Pupils are equal, round, and reactive to light.  Neck: Normal range of motion. Neck supple. No JVD present. No tracheal deviation present. No thyromegaly present.  Cardiovascular: Normal rate, regular rhythm and normal heart sounds. Exam reveals no gallop and no friction rub.  No murmur heard. Pulmonary/Chest: Effort normal. No respiratory distress. She has no wheezes. She has no rales. She exhibits no tenderness.  Abdominal: Soft. Bowel sounds are normal.  Musculoskeletal: Normal range of motion.  Lymphadenopathy:    She has no cervical adenopathy.  Neurological: She is alert and oriented to person, place, and time. No cranial nerve deficit.  Skin: Skin is warm and dry. She is not diaphoretic.  Psychiatric: She has a normal mood and affect. Her behavior is normal. Judgment and thought content normal.      Assessment/Plan: 1. COPD with acute exacerbation (HCC) - Flovent MDI. She does not to use Neb  2. Benign hypertension - Continue norvasc 5 mg, Lisinopril 2.5 mg and metoprolol 25 MG QD  - Basic Metabolic Panel (BMET)  3. Dehydration - Check Metb  4. Hyponatremia - Monitor for now    General Counseling: I have discussed the findings of the evaluation and examination with Zanovia.  I have also discussed any further diagnostic evaluation that may be needed or ordered today. Chen verbalizes understanding of the findings of todays visit. We also reviewed her medications today. she has been encouraged to call the office with any questions  or concerns that should arise related to todays visit.   Counseling: . Avoid smoking . Take all medications as prescribed   Time spent:30 min I have reviewed all medical records from hospital follow up including radiology reports and consults from other physicians. Appropriate follow up diagnostics will be scheduled as needed. Patient/ Family understands the plan of treatment.   Dr Lavera Guise, MD Internal Medicine

## 2017-07-26 DIAGNOSIS — J441 Chronic obstructive pulmonary disease with (acute) exacerbation: Secondary | ICD-10-CM | POA: Diagnosis not present

## 2017-07-26 DIAGNOSIS — J9611 Chronic respiratory failure with hypoxia: Secondary | ICD-10-CM | POA: Diagnosis not present

## 2017-07-26 DIAGNOSIS — R2681 Unsteadiness on feet: Secondary | ICD-10-CM | POA: Diagnosis not present

## 2017-07-26 DIAGNOSIS — I2510 Atherosclerotic heart disease of native coronary artery without angina pectoris: Secondary | ICD-10-CM | POA: Diagnosis not present

## 2017-07-26 DIAGNOSIS — I10 Essential (primary) hypertension: Secondary | ICD-10-CM | POA: Diagnosis not present

## 2017-07-26 DIAGNOSIS — M6281 Muscle weakness (generalized): Secondary | ICD-10-CM | POA: Diagnosis not present

## 2017-07-26 MED ORDER — TIOTROPIUM BROMIDE MONOHYDRATE 2.5 MCG/ACT IN AERS: 2.0000 | INHALATION_SPRAY | Freq: Every day | RESPIRATORY_TRACT | 5 refills | Status: DC

## 2017-07-26 NOTE — Addendum Note (Signed)
Addended by: Stephanie Coup on: 07/26/2017 12:20 PM   Modules accepted: Orders

## 2017-07-27 ENCOUNTER — Other Ambulatory Visit: Payer: Self-pay

## 2017-07-27 DIAGNOSIS — I10 Essential (primary) hypertension: Secondary | ICD-10-CM | POA: Diagnosis not present

## 2017-07-27 DIAGNOSIS — M6281 Muscle weakness (generalized): Secondary | ICD-10-CM | POA: Diagnosis not present

## 2017-07-27 DIAGNOSIS — J9611 Chronic respiratory failure with hypoxia: Secondary | ICD-10-CM | POA: Diagnosis not present

## 2017-07-27 DIAGNOSIS — J441 Chronic obstructive pulmonary disease with (acute) exacerbation: Secondary | ICD-10-CM | POA: Diagnosis not present

## 2017-07-27 DIAGNOSIS — I2510 Atherosclerotic heart disease of native coronary artery without angina pectoris: Secondary | ICD-10-CM | POA: Diagnosis not present

## 2017-07-27 DIAGNOSIS — R2681 Unsteadiness on feet: Secondary | ICD-10-CM | POA: Diagnosis not present

## 2017-07-27 MED ORDER — AMLODIPINE BESYLATE 5 MG PO TABS: 5.0000 mg | ORAL_TABLET | Freq: Every day | ORAL | 3 refills | Status: DC

## 2017-07-30 DIAGNOSIS — J9611 Chronic respiratory failure with hypoxia: Secondary | ICD-10-CM | POA: Diagnosis not present

## 2017-07-30 DIAGNOSIS — I2510 Atherosclerotic heart disease of native coronary artery without angina pectoris: Secondary | ICD-10-CM | POA: Diagnosis not present

## 2017-07-30 DIAGNOSIS — I10 Essential (primary) hypertension: Secondary | ICD-10-CM | POA: Diagnosis not present

## 2017-07-30 DIAGNOSIS — R2681 Unsteadiness on feet: Secondary | ICD-10-CM | POA: Diagnosis not present

## 2017-07-30 DIAGNOSIS — M6281 Muscle weakness (generalized): Secondary | ICD-10-CM | POA: Diagnosis not present

## 2017-07-30 DIAGNOSIS — J441 Chronic obstructive pulmonary disease with (acute) exacerbation: Secondary | ICD-10-CM | POA: Diagnosis not present

## 2017-07-31 DIAGNOSIS — J449 Chronic obstructive pulmonary disease, unspecified: Secondary | ICD-10-CM | POA: Diagnosis not present

## 2017-07-31 DIAGNOSIS — M6281 Muscle weakness (generalized): Secondary | ICD-10-CM | POA: Diagnosis not present

## 2017-07-31 DIAGNOSIS — I2510 Atherosclerotic heart disease of native coronary artery without angina pectoris: Secondary | ICD-10-CM | POA: Diagnosis not present

## 2017-07-31 DIAGNOSIS — J441 Chronic obstructive pulmonary disease with (acute) exacerbation: Secondary | ICD-10-CM | POA: Diagnosis not present

## 2017-07-31 DIAGNOSIS — R0902 Hypoxemia: Secondary | ICD-10-CM | POA: Diagnosis not present

## 2017-07-31 DIAGNOSIS — R2681 Unsteadiness on feet: Secondary | ICD-10-CM | POA: Diagnosis not present

## 2017-07-31 DIAGNOSIS — J9611 Chronic respiratory failure with hypoxia: Secondary | ICD-10-CM | POA: Diagnosis not present

## 2017-07-31 DIAGNOSIS — I10 Essential (primary) hypertension: Secondary | ICD-10-CM | POA: Diagnosis not present

## 2017-08-01 DIAGNOSIS — J9611 Chronic respiratory failure with hypoxia: Secondary | ICD-10-CM | POA: Diagnosis not present

## 2017-08-01 DIAGNOSIS — M6281 Muscle weakness (generalized): Secondary | ICD-10-CM | POA: Diagnosis not present

## 2017-08-01 DIAGNOSIS — R2681 Unsteadiness on feet: Secondary | ICD-10-CM | POA: Diagnosis not present

## 2017-08-01 DIAGNOSIS — I2510 Atherosclerotic heart disease of native coronary artery without angina pectoris: Secondary | ICD-10-CM | POA: Diagnosis not present

## 2017-08-01 DIAGNOSIS — J441 Chronic obstructive pulmonary disease with (acute) exacerbation: Secondary | ICD-10-CM | POA: Diagnosis not present

## 2017-08-01 DIAGNOSIS — I10 Essential (primary) hypertension: Secondary | ICD-10-CM | POA: Diagnosis not present

## 2017-08-03 ENCOUNTER — Telehealth: Payer: Self-pay | Admitting: Internal Medicine

## 2017-08-03 DIAGNOSIS — J449 Chronic obstructive pulmonary disease, unspecified: Secondary | ICD-10-CM

## 2017-08-03 NOTE — Telephone Encounter (Signed)
Patient aware She is already on 02 qhs. She is aware to change from 2 L to 1 L. Nothing further needed.

## 2017-08-06 DIAGNOSIS — J441 Chronic obstructive pulmonary disease with (acute) exacerbation: Secondary | ICD-10-CM | POA: Diagnosis not present

## 2017-08-06 DIAGNOSIS — I2510 Atherosclerotic heart disease of native coronary artery without angina pectoris: Secondary | ICD-10-CM | POA: Diagnosis not present

## 2017-08-06 DIAGNOSIS — I10 Essential (primary) hypertension: Secondary | ICD-10-CM | POA: Diagnosis not present

## 2017-08-06 DIAGNOSIS — R2681 Unsteadiness on feet: Secondary | ICD-10-CM | POA: Diagnosis not present

## 2017-08-06 DIAGNOSIS — M6281 Muscle weakness (generalized): Secondary | ICD-10-CM | POA: Diagnosis not present

## 2017-08-06 DIAGNOSIS — J9611 Chronic respiratory failure with hypoxia: Secondary | ICD-10-CM | POA: Diagnosis not present

## 2017-08-09 DIAGNOSIS — I10 Essential (primary) hypertension: Secondary | ICD-10-CM | POA: Diagnosis not present

## 2017-08-09 DIAGNOSIS — I2510 Atherosclerotic heart disease of native coronary artery without angina pectoris: Secondary | ICD-10-CM | POA: Diagnosis not present

## 2017-08-09 DIAGNOSIS — R2681 Unsteadiness on feet: Secondary | ICD-10-CM | POA: Diagnosis not present

## 2017-08-09 DIAGNOSIS — J9611 Chronic respiratory failure with hypoxia: Secondary | ICD-10-CM | POA: Diagnosis not present

## 2017-08-09 DIAGNOSIS — M6281 Muscle weakness (generalized): Secondary | ICD-10-CM | POA: Diagnosis not present

## 2017-08-09 DIAGNOSIS — J441 Chronic obstructive pulmonary disease with (acute) exacerbation: Secondary | ICD-10-CM | POA: Diagnosis not present

## 2017-08-13 DIAGNOSIS — J441 Chronic obstructive pulmonary disease with (acute) exacerbation: Secondary | ICD-10-CM | POA: Diagnosis not present

## 2017-08-13 DIAGNOSIS — R2681 Unsteadiness on feet: Secondary | ICD-10-CM | POA: Diagnosis not present

## 2017-08-13 DIAGNOSIS — I10 Essential (primary) hypertension: Secondary | ICD-10-CM | POA: Diagnosis not present

## 2017-08-13 DIAGNOSIS — I2510 Atherosclerotic heart disease of native coronary artery without angina pectoris: Secondary | ICD-10-CM | POA: Diagnosis not present

## 2017-08-13 DIAGNOSIS — M6281 Muscle weakness (generalized): Secondary | ICD-10-CM | POA: Diagnosis not present

## 2017-08-13 DIAGNOSIS — J9611 Chronic respiratory failure with hypoxia: Secondary | ICD-10-CM | POA: Diagnosis not present

## 2017-08-14 DIAGNOSIS — I10 Essential (primary) hypertension: Secondary | ICD-10-CM | POA: Diagnosis not present

## 2017-08-15 ENCOUNTER — Telehealth: Payer: Self-pay | Admitting: Internal Medicine

## 2017-08-15 DIAGNOSIS — M6281 Muscle weakness (generalized): Secondary | ICD-10-CM | POA: Diagnosis not present

## 2017-08-15 DIAGNOSIS — J9611 Chronic respiratory failure with hypoxia: Secondary | ICD-10-CM | POA: Diagnosis not present

## 2017-08-15 DIAGNOSIS — R2681 Unsteadiness on feet: Secondary | ICD-10-CM | POA: Diagnosis not present

## 2017-08-15 DIAGNOSIS — I2510 Atherosclerotic heart disease of native coronary artery without angina pectoris: Secondary | ICD-10-CM | POA: Diagnosis not present

## 2017-08-15 DIAGNOSIS — I10 Essential (primary) hypertension: Secondary | ICD-10-CM | POA: Diagnosis not present

## 2017-08-15 DIAGNOSIS — J441 Chronic obstructive pulmonary disease with (acute) exacerbation: Secondary | ICD-10-CM | POA: Diagnosis not present

## 2017-08-15 LAB — BASIC METABOLIC PANEL
BUN/Creatinine Ratio: 18 (ref 12–28)
BUN: 15 mg/dL (ref 8–27)
CO2: 24 mmol/L (ref 20–29)
CREATININE: 0.85 mg/dL (ref 0.57–1.00)
Calcium: 9.7 mg/dL (ref 8.7–10.3)
Chloride: 96 mmol/L (ref 96–106)
GFR calc Af Amer: 78 mL/min/{1.73_m2} (ref >59)
GFR calc non Af Amer: 67 mL/min/{1.73_m2} (ref >59)
Glucose: 78 mg/dL (ref 65–99)
Potassium: 4.6 mmol/L (ref 3.5–5.2)
Sodium: 137 mmol/L (ref 134–144)

## 2017-08-15 NOTE — Telephone Encounter (Signed)
Rx verified. Patient is on until next f/u Mon/Wed/Fri.

## 2017-08-15 NOTE — Telephone Encounter (Signed)
Pt calling wanting to know how long is she to take the antibiotic    Would like a call back

## 2017-08-16 DIAGNOSIS — R2681 Unsteadiness on feet: Secondary | ICD-10-CM | POA: Diagnosis not present

## 2017-08-16 DIAGNOSIS — I10 Essential (primary) hypertension: Secondary | ICD-10-CM | POA: Diagnosis not present

## 2017-08-16 DIAGNOSIS — M6281 Muscle weakness (generalized): Secondary | ICD-10-CM | POA: Diagnosis not present

## 2017-08-16 DIAGNOSIS — J441 Chronic obstructive pulmonary disease with (acute) exacerbation: Secondary | ICD-10-CM | POA: Diagnosis not present

## 2017-08-16 DIAGNOSIS — J9611 Chronic respiratory failure with hypoxia: Secondary | ICD-10-CM | POA: Diagnosis not present

## 2017-08-16 DIAGNOSIS — I2510 Atherosclerotic heart disease of native coronary artery without angina pectoris: Secondary | ICD-10-CM | POA: Diagnosis not present

## 2017-08-20 ENCOUNTER — Telehealth: Payer: Self-pay | Admitting: Internal Medicine

## 2017-08-20 ENCOUNTER — Other Ambulatory Visit: Payer: Self-pay

## 2017-08-20 ENCOUNTER — Other Ambulatory Visit: Payer: Self-pay | Admitting: Internal Medicine

## 2017-08-20 MED ORDER — CLOTRIMAZOLE 10 MG MT TROC: OROMUCOSAL | 0 refills | Status: DC

## 2017-08-20 MED ORDER — NYSTATIN 100000 UNIT/GM EX POWD: Freq: Four times a day (QID) | CUTANEOUS | 0 refills | Status: DC

## 2017-08-20 NOTE — Telephone Encounter (Signed)
[facility] called pt had nystatin before for thrush they not make no more as per dr Clayborn Bigness send clotrimazole 10 mg

## 2017-08-20 NOTE — Telephone Encounter (Signed)
Pt calling asking if we can call anything for her Thrush    Please advise

## 2017-08-20 NOTE — Telephone Encounter (Signed)
Send in script for nystatin

## 2017-08-20 NOTE — Telephone Encounter (Signed)
Please advise on message below.

## 2017-08-21 ENCOUNTER — Other Ambulatory Visit: Payer: Self-pay | Admitting: Internal Medicine

## 2017-08-21 MED ORDER — NYSTATIN 100000 UNIT/ML MT SUSP: 5.0000 mL | Freq: Four times a day (QID) | OROMUCOSAL | 1 refills | Status: DC

## 2017-08-21 NOTE — Telephone Encounter (Signed)
Please advise on dosage and directions.

## 2017-08-21 NOTE — Telephone Encounter (Signed)
Sent Nystatin swish and swallow to pharmacy.

## 2017-08-21 NOTE — Telephone Encounter (Signed)
You sent Nystatin powder to the pharmacy.

## 2017-08-21 NOTE — Telephone Encounter (Signed)
Pt informed that Nystatin has been called in.

## 2017-09-04 ENCOUNTER — Ambulatory Visit (INDEPENDENT_AMBULATORY_CARE_PROVIDER_SITE_OTHER): Payer: Medicare Other | Admitting: Internal Medicine

## 2017-09-04 ENCOUNTER — Encounter: Payer: Self-pay | Admitting: Internal Medicine

## 2017-09-04 VITALS — BP 149/61 | HR 65 | Resp 16 | Ht 60.0 in | Wt 86.6 lb

## 2017-09-04 DIAGNOSIS — J449 Chronic obstructive pulmonary disease, unspecified: Secondary | ICD-10-CM | POA: Diagnosis not present

## 2017-09-04 DIAGNOSIS — Z1231 Encounter for screening mammogram for malignant neoplasm of breast: Secondary | ICD-10-CM | POA: Diagnosis not present

## 2017-09-04 DIAGNOSIS — F411 Generalized anxiety disorder: Secondary | ICD-10-CM

## 2017-09-04 DIAGNOSIS — I10 Essential (primary) hypertension: Secondary | ICD-10-CM

## 2017-09-04 DIAGNOSIS — Z1239 Encounter for other screening for malignant neoplasm of breast: Secondary | ICD-10-CM

## 2017-09-04 MED ORDER — AMLODIPINE BESYLATE 5 MG PO TABS: ORAL_TABLET | ORAL | 6 refills | Status: DC

## 2017-09-04 MED ORDER — LISINOPRIL 5 MG PO TABS: 2.5000 mg | ORAL_TABLET | Freq: Every day | ORAL | 3 refills | Status: DC

## 2017-09-04 MED ORDER — METOPROLOL TARTRATE 25 MG PO TABS: 12.5000 mg | ORAL_TABLET | Freq: Two times a day (BID) | ORAL | 3 refills | Status: DC

## 2017-09-04 MED ORDER — LORAZEPAM 0.5 MG PO TABS: ORAL_TABLET | ORAL | 2 refills | Status: DC

## 2017-09-04 NOTE — Progress Notes (Signed)
[facility] [place], [place] [address]  Internal MEDICINE  Office Visit Note  Patient Name: Christina Hester  [id number]  [id number]  Date of Service: 09/23/2017  Chief Complaint  Patient presents with  . Hypertension  . COPD  . Anxiety    Hypertension  This is a recurrent problem. The current episode started more than 1 year ago. The problem has been waxing and waning since onset. The problem is uncontrolled (She went to ED with elevated bp, Thinks gets side effects on high dose of any medications, will like to take low dose meds that;s why she is on 3 antihypertensives. ). Associated symptoms include anxiety and shortness of breath. Pertinent negatives include no chest pain, neck pain or palpitations.  COPD  She complains of chest tightness, cough and shortness of breath. This is a chronic problem. The problem has been unchanged. Associated symptoms include postnasal drip. Pertinent negatives include no chest pain, rhinorrhea, sneezing or sore throat. Her symptoms are alleviated by anxiolytics, steroid inhaler and beta-agonist. Her past medical history is significant for COPD.  Anxiety  Presents for follow-up visit. Symptoms include insomnia, irritability, nervous/anxious behavior and shortness of breath. Patient reports no chest pain, nausea, palpitations or suicidal ideas. Symptoms occur most days. The severity of symptoms is moderate. The quality of sleep is fair.    She feels better if takes all her medications as prescribed.    Current Medication: Outpatient Encounter Medications as of 09/04/2017  Medication Sig  . amLODipine (NORVASC) 5 MG tablet Take one tab po qhs  . azithromycin (ZITHROMAX) 250 MG tablet Take every Monday, Wednesday, Friday.  . benzonatate (TESSALON) 100 MG capsule Take 1 capsule (100 mg total) by mouth 3 (three) times daily. (Patient taking differently: Take 100 mg by mouth 3 (three) times daily as needed. )  . buPROPion  (WELLBUTRIN) 100 MG tablet Take 100 mg by mouth daily.  . clopidogrel (PLAVIX) 75 MG tablet Take 1 tablet (75 mg total) by mouth daily. (Patient taking differently: Take 75 mg by mouth at bedtime. )  . clotrimazole (MYCELEX) 10 MG troche Take 4 to 5 times a  Day as needed fot thrush  . feeding supplement, ENSURE ENLIVE, (ENSURE ENLIVE) LIQD Take 237 mLs by mouth 2 (two) times daily between meals.  . fluticasone (FLOVENT HFA) 110 MCG/ACT inhaler Inhale 2 puffs into the lungs 2 (two) times daily.  Marland Kitchen ipratropium-albuterol (DUONEB) 0.5-2.5 (3) MG/3ML SOLN Take 3 mLs by nebulization every 4 (four) hours.  Marland Kitchen lisinopril (PRINIVIL,ZESTRIL) 5 MG tablet Take 0.5 tablets (2.5 mg total) by mouth daily.  Marland Kitchen LORazepam (ATIVAN) 0.5 MG tablet Half to one tab at night  . Melatonin 5 MG TABS Take 5 mg by mouth at bedtime as needed (sleep).  . metoprolol tartrate (LOPRESSOR) 25 MG tablet Take 0.5 tablets (12.5 mg total) by mouth 2 (two) times daily.  . Multiple Vitamin (MULTIVITAMIN WITH MINERALS) TABS tablet Take 1 tablet by mouth daily.  . nitroGLYCERIN (NITROSTAT) 0.4 MG SL tablet Place 1 tablet (0.4 mg total) under the tongue every 5 (five) minutes as needed for chest pain.  Marland Kitchen nystatin (MYCOSTATIN) 100000 UNIT/ML suspension Take 5 mLs (500,000 Units total) by mouth 4 (four) times daily. Swish in mouth for 30 seconds then swallow.  . Tiotropium Bromide Monohydrate (SPIRIVA RESPIMAT) 2.5 MCG/ACT AERS Inhale 2 puffs into the lungs daily.  . [DISCONTINUED] amLODipine (NORVASC) 5 MG tablet Take 1 tablet (5 mg total) by mouth daily.  . [DISCONTINUED]  lisinopril (PRINIVIL,ZESTRIL) 5 MG tablet Take 0.5 tablets (2.5 mg total) by mouth daily.  . [DISCONTINUED] LORazepam (ATIVAN) 0.5 MG tablet Take 1 tablet (0.5 mg total) by mouth 2 (two) times daily as needed for anxiety.  . [DISCONTINUED] losartan (COZAAR) 100 MG tablet Take 1 tablet (100 mg total) by mouth daily.  . [DISCONTINUED] metoprolol tartrate (LOPRESSOR) 25 MG  tablet Take 1 tablet (25 mg total) by mouth 2 (two) times daily.   No facility-administered encounter medications on file as of 09/04/2017.     Surgical History: Past Surgical History:  Procedure Laterality Date  . ABDOMINAL HYSTERECTOMY    . BREAST EXCISIONAL BIOPSY Left 20+ YRS AGO   NEG  . LEFT HEART CATH Bilateral 03/05/2017   Procedure: Left Heart Cath with poss PCI;  Surgeon: Wellington Hampshire, MD;  Location: [facility];  Service: Cardiovascular;  Laterality: Bilateral;    Medical History: Past Medical History:  Diagnosis Date  . AAA (abdominal aortic aneurysm) ([place])    a. 05/2016 Abd U/S: 3.02 x 3.02 AAA.  Marland Kitchen CAD (coronary artery disease)    a. 02/2017 NSTEMI/Cath: LM nl, LAD mild dzs, D2 min irregs, LCX mild dzs, OM2 50ost, RCA 95p, 155m, L-->R collats, EF 55-65%.  Marland Kitchen COPD (chronic obstructive pulmonary disease) ([place])   . Diastolic dysfunction    a. 02/2017 Echo: EF 55-60%, gr1 DD, ? inf HK, mild MR.  Marland Kitchen Hyperlipidemia   . Hypertension   . PAD (peripheral artery disease) ([place])    a. 05/2016 ABI's: R 1.22, L 1.11.  . Tobacco abuse     Family History: Family History  Problem Relation Age of Onset  . Breast cancer Mother   . Breast cancer Maternal Aunt   . Breast cancer Maternal Aunt   . Breast cancer Maternal Aunt     Social History   Socioeconomic History  . Marital status: Divorced    Spouse name: Not on file  . Number of children: Not on file  . Years of education: Not on file  . Highest education level: Not on file  Social Needs  . Financial resource strain: Not on file  . Food insecurity - worry: Not on file  . Food insecurity - inability: Not on file  . Transportation needs - medical: Not on file  . Transportation needs - non-medical: Not on file  Occupational History  . Not on file  Tobacco Use  . Smoking status: Former Smoker    Packs/day: 0.25    Last attempt to quit: 07/12/2017    Years since quitting: 0.2  . Smokeless tobacco: Never  Used  . Tobacco comment: off and on; not every day.  Substance and Sexual Activity  . Alcohol use: No  . Drug use: No  . Sexual activity: Not on file  Other Topics Concern  . Not on file  Social History Narrative  . Not on file      Review of Systems  Constitutional: Positive for irritability. Negative for chills, fatigue and unexpected weight change.  HENT: Positive for postnasal drip. Negative for congestion, rhinorrhea, sneezing and sore throat.   Eyes: Negative for redness.  Respiratory: Positive for cough, chest tightness and shortness of breath.   Cardiovascular: Negative for chest pain and palpitations.  Gastrointestinal: Negative for abdominal pain, constipation, diarrhea, nausea and vomiting.  Genitourinary: Negative for dysuria and frequency.  Musculoskeletal: Negative for arthralgias, back pain, joint swelling and neck pain.  Skin: Negative for rash.  Neurological: Negative.  Negative  for tremors and numbness.  Hematological: Negative for adenopathy. Does not bruise/bleed easily.  Psychiatric/Behavioral: Negative for behavioral problems (Depression), sleep disturbance and suicidal ideas. The patient is nervous/anxious and has insomnia.     Vital Signs: BP (!) 149/61 (BP Location: Left Arm, Patient Position: Sitting)   Pulse 65   Resp 16   Ht 5' (1.524 m)   Wt 86 lb 9.6 oz (39.3 kg)   SpO2 97%   BMI 16.91 kg/m    Physical Exam  Constitutional: She is oriented to person, place, and time. She appears well-developed and well-nourished.  looks anxious   HENT:  Head: Normocephalic and atraumatic.  Eyes: Conjunctivae and EOM are normal. Pupils are equal, round, and reactive to light. No scleral icterus.  Neck: Normal range of motion. Neck supple. No JVD present. No thyromegaly present.  Cardiovascular: Normal rate, regular rhythm and normal heart sounds.  Pulmonary/Chest: No respiratory distress. She has wheezes. She has no rales. She exhibits no tenderness.   Decreased bs bilaterally  Abdominal: Soft.  Musculoskeletal: Normal range of motion.  Neurological: She is alert and oriented to person, place, and time.  Skin: Skin is warm and dry.  Psychiatric: Her behavior is normal. Judgment and thought content normal.    Assessment/Plan: 1. Essential hypertension, benign - Continue amLODipine (NORVASC) 5 MG tablet; Take one tab po qhs  Dispense: 30 tablet; Refill: 6 - metoprolol tartrate (LOPRESSOR) 25 MG tablet; Take 0.5 tablets (12.5 mg total) by mouth 2 (two) times daily.  Dispense: 60 tablet; Refill: 3 - lisinopril (PRINIVIL,ZESTRIL) 5 MG tablet; Take 0.5 tablets (2.5 mg total) by mouth daily.  Dispense: 30 tablet; Refill: 3  2. Chronic obstructive pulmonary disease, unspecified COPD type ([place]) - Encouraged to see pulmonary. Smoking cessation  3. Generalized anxiety disorder - LORazepam (ATIVAN) 0.5 MG tablet; Half to one tab at night  Dispense: 30 tablet; Refill: 2  4. Breast cancer screening - MM DIGITAL SCREENING BILATERAL; Future  General Counseling: Audra verbalizes understanding of the findings of todays visit and agrees with plan of treatment. I have discussed any further diagnostic evaluation that may be needed or ordered today. We also reviewed her medications today. she has been encouraged to call the office with any questions or concerns that should arise related to todays visit. Encouraged smoking cessation    Orders Placed This Encounter  Procedures  . MM DIGITAL SCREENING BILATERAL    Meds ordered this encounter  Medications  . amLODipine (NORVASC) 5 MG tablet    Sig: Take one tab po qhs    Dispense:  30 tablet    Refill:  6  . metoprolol tartrate (LOPRESSOR) 25 MG tablet    Sig: Take 0.5 tablets (12.5 mg total) by mouth 2 (two) times daily.    Dispense:  60 tablet    Refill:  3  . lisinopril (PRINIVIL,ZESTRIL) 5 MG tablet    Sig: Take 0.5 tablets (2.5 mg total) by mouth daily.    Dispense:  30 tablet    Refill:   3  . LORazepam (ATIVAN) 0.5 MG tablet    Sig: Half to one tab at night    Dispense:  30 tablet    Refill:  2    Time spent:25Minutes    Dr Lavera Guise Internal medicine

## 2017-09-10 ENCOUNTER — Telehealth: Payer: Self-pay | Admitting: Internal Medicine

## 2017-09-10 NOTE — Telephone Encounter (Signed)
She most likely has a viral infection, continue her current medications. Use the nebulizer 3 to 4 times daily until she is feeling better.

## 2017-09-10 NOTE — Telephone Encounter (Signed)
Pt calling stating starting on Friday she's not been feeling well, has a bad cough.   She states she is already on antibiotics wanted to know if she needs to take more of it.  Would like advise on this She also states she is losing her voice   Please call back

## 2017-09-10 NOTE — Telephone Encounter (Signed)
Pt made aware of suggestions. She will add Tessalon Perles for cough. Advised to call back if not feeling better by Wednesday.

## 2017-09-13 ENCOUNTER — Ambulatory Visit (INDEPENDENT_AMBULATORY_CARE_PROVIDER_SITE_OTHER): Payer: Medicare Other | Admitting: Internal Medicine

## 2017-09-13 ENCOUNTER — Telehealth: Payer: Self-pay | Admitting: Internal Medicine

## 2017-09-13 ENCOUNTER — Encounter: Payer: Self-pay | Admitting: Internal Medicine

## 2017-09-13 VITALS — BP 116/80 | HR 51 | Temp 98.3°F | Ht 60.0 in | Wt 84.0 lb

## 2017-09-13 DIAGNOSIS — J449 Chronic obstructive pulmonary disease, unspecified: Secondary | ICD-10-CM | POA: Diagnosis not present

## 2017-09-13 MED ORDER — DOXYCYCLINE HYCLATE 100 MG PO CAPS
100.0000 mg | ORAL_CAPSULE | Freq: Two times a day (BID) | ORAL | 0 refills | Status: DC
Start: 2017-09-13 — End: 2017-10-11

## 2017-09-13 NOTE — Telephone Encounter (Signed)
Pt called on 08/21/17 c/o multiple sx cough, fever and body aches. She was started on  azithromycin every Monday, Wednesday, Friday last office visit in Jan 2019. Pt called on 09/10/17 and was given Ladona Ridgel and informed this was probably viral.  Pt states she is not feeling any better.

## 2017-09-13 NOTE — Telephone Encounter (Signed)
Pt states she is not feeling any better. States she is still coughing, has a temp on and off and aches all over. States this has been been going on since Friday. Please call.

## 2017-09-13 NOTE — Progress Notes (Signed)
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   COPD/emphysema with acute bronchitis/exacerbation. -Continue spiriva, azithromycin.  --Patient can not take oral or inhaler steroids.  --Will give course of doxycycline.  -Patient instructed to use nebulizer treatments as needed. --Will check sputum cultures.  --If declines further or has persistent symptoms will need to refer to ED.   Meds ordered this encounter  Medications  . doxycycline (VIBRAMYCIN) 100 MG capsule    Sig: Take 1 capsule (100 mg total) by mouth 2 (two) times daily.    Dispense:  14 capsule    Refill:  0   Orders Placed This Encounter  Procedures  . Culture, sputum-assessment    Return in about 3 months (around 12/11/2017).   Date: 09/13/2017  MRN# [id number] Christina Hester [birth year]   Christina Hester is a 77 y.o. old female seen in follow up for chief complaint of  Chief Complaint  Patient presents with  . Acute Visit    pt c/o cough,fever off and on and bodyaches. SOB most of the time: x 6 days     HPI:   Patient is 77 year old female recently admitted to the hospital from 07/14/17 to 07/19/17 for COPD exacerbation, pneumonia.  History is also provided by her daughter who is present and very emotional at this time. The patient called 3 days ago with fever, body aches, cough and already on abx. She was advised that patient likely has flu or other virus, and to take tessalon for the cough. She comes back in today with similar complaints, persistent fever, body aches.  She had a dentist visit that preceeded it, and has been coughing up stuff.   Desat 07/25/17; on RA at rest sat was 98% and HR 81. After walking 180 sat was 97% and HR 87.  Imaging personally reviewed, CT chest 07/14/17, diffuse emphysema.  Results for Christina Hester, Christina Hester (MRN [id number]) as of 07/25/2017 08:25  Ref. Range 07/15/2017 20:09  Sample type Unknown  ARTERIAL DRAW  Delivery systems Unknown BILEVEL POSITIVE .Marland KitchenMarland Kitchen  FIO2 Unknown 0.40  Inspiratory PAP Unknown 12  Expiratory PAP Unknown 6  pH, Arterial Latest Ref Range: 7.350 - 7.450  7.36  pCO2 arterial Latest Ref Range: 32.0 - 48.0 mmHg 48  pO2, Arterial Latest Ref Range: 83.0 - 108.0 mmHg 130 (H)  Acid-Base Excess Latest Ref Range: 0.0 - 2.0 mmol/L 1.1  Bicarbonate Latest Ref Range: 20.0 - 28.0 mmol/L 27.1  O2 Saturation Latest Units: % 98.8  Patient temperature Unknown 37.0       Medication:    Current Outpatient Medications:  .  amLODipine (NORVASC) 5 MG tablet, Take one tab po qhs, Disp: 30 tablet, Rfl: 6 .  azithromycin (ZITHROMAX) 250 MG tablet, Take every Monday, Wednesday, Friday., Disp: 13 each, Rfl: 5 .  benzonatate (TESSALON) 100 MG capsule, Take 1 capsule (100 mg total) by mouth 3 (three) times daily. (Patient taking differently: Take 100 mg by mouth 3 (three) times daily as needed. ), Disp: 20 capsule, Rfl: 0 .  buPROPion (WELLBUTRIN) 100 MG tablet, Take 100 mg by mouth daily., Disp: , Rfl:  .  clopidogrel (PLAVIX) 75 MG tablet, Take 1 tablet (75 mg total) by mouth daily. (Patient taking differently: Take 75 mg by mouth at bedtime. ), Disp: 90 tablet, Rfl: 3 .  clotrimazole (MYCELEX) 10 MG troche, Take 4 to 5 times a  Day as needed fot thrush, Disp: 70 tablet, Rfl:  0 .  feeding supplement, ENSURE ENLIVE, (ENSURE ENLIVE) LIQD, Take 237 mLs by mouth 2 (two) times daily between meals., Disp: 237 mL, Rfl: 12 .  fluticasone (FLOVENT HFA) 110 MCG/ACT inhaler, Inhale 2 puffs into the lungs 2 (two) times daily., Disp: 1 Inhaler, Rfl: 12 .  ipratropium-albuterol (DUONEB) 0.5-2.5 (3) MG/3ML SOLN, Take 3 mLs by nebulization every 4 (four) hours., Disp: 360 mL, Rfl: 3 .  lisinopril (PRINIVIL,ZESTRIL) 5 MG tablet, Take 0.5 tablets (2.5 mg total) by mouth daily., Disp: 30 tablet, Rfl: 3 .  LORazepam (ATIVAN) 0.5 MG tablet, Half to one tab at night, Disp: 30 tablet, Rfl: 2 .  Melatonin 5  MG TABS, Take 5 mg by mouth at bedtime as needed (sleep)., Disp: , Rfl:  .  metoprolol tartrate (LOPRESSOR) 25 MG tablet, Take 0.5 tablets (12.5 mg total) by mouth 2 (two) times daily., Disp: 60 tablet, Rfl: 3 .  Multiple Vitamin (MULTIVITAMIN WITH MINERALS) TABS tablet, Take 1 tablet by mouth daily., Disp: 30 tablet, Rfl: 0 .  nitroGLYCERIN (NITROSTAT) 0.4 MG SL tablet, Place 1 tablet (0.4 mg total) under the tongue every 5 (five) minutes as needed for chest pain., Disp: 20 tablet, Rfl: 12 .  nystatin (MYCOSTATIN) 100000 UNIT/ML suspension, Take 5 mLs (500,000 Units total) by mouth 4 (four) times daily. Swish in mouth for 30 seconds then swallow., Disp: 100 mL, Rfl: 1 .  Tiotropium Bromide Monohydrate (SPIRIVA RESPIMAT) 2.5 MCG/ACT AERS, Inhale 2 puffs into the lungs daily., Disp: 1 Inhaler, Rfl: 5   Allergies:  Aspirin; Prednisone; Sulfa antibiotics; and Symbicort [budesonide-formoterol fumarate]  Review of Systems: Gen:  Denies  fever, sweats. HEENT: Denies blurred vision. Cvc:  No dizziness, chest pain or heaviness Resp:   Denies cough or sputum porduction. Gi: Denies swallowing difficulty, stomach pain. constipation, bowel incontinence Gu:  Denies bladder incontinence, burning urine Ext:   No Joint pain, stiffness. Skin: No skin rash, easy bruising. Endoc:  No polyuria, polydipsia. Psych: No depression, insomnia. Other:  All other systems were reviewed and found to be negative other than what is mentioned in the HPI.   Physical Examination:   VS: BP 116/80 (BP Location: Left Arm, Cuff Size: Normal)   Pulse (!) 51   Temp 98.3 F (36.8 C) (Oral)   Ht 5' (1.524 m)   Wt 84 lb (38.1 kg)   SpO2 91%   BMI 16.41 kg/m   General Appearance: No distress  Neuro:without focal findings,  speech normal,  HEENT: PERRLA, EOM intact. Pulmonary: Bilateral rhonchi, reduced air entry in both lungs. CardiovascularNormal S1,S2.  No m/r/g.   Abdomen: Benign, Soft, non-tender. Renal:  No  costovertebral tenderness  GU:  Not performed at this time. Endoc: No evident thyromegaly, no signs of acromegaly. Skin:   warm, no rash. Extremities: normal, no cyanosis, clubbing.   LABORATORY PANEL:   CBC No results for input(s): WBC, HGB, HCT, PLT in the last 168 hours. ------------------------------------------------------------------------------------------------------------------  Chemistries  No results for input(s): NA, K, CL, CO2, GLUCOSE, BUN, CREATININE, CALCIUM, MG, AST, ALT, ALKPHOS, BILITOT in the last 168 hours.  Invalid input(s): GFRCGP ------------------------------------------------------------------------------------------------------------------  Cardiac Enzymes No results for input(s): TROPONINI in the last 168 hours. ------------------------------------------------------------  RADIOLOGY:   No results found for this or any previous visit. Results for orders placed during the hospital encounter of 03/02/17  DG Chest 2 View   Narrative CLINICAL DATA:  Acute onset of shortness of breath and left chest pain, radiating to the back. Nausea and vomiting. Initial  encounter.  EXAM: CHEST  2 VIEW  COMPARISON:  Chest radiograph and CTA of the chest performed 01/12/2017  FINDINGS: The lungs are well-aerated. Peribronchial thickening is noted. There is no evidence of focal opacification, pleural effusion or pneumothorax.  The heart is normal in size; the mediastinal contour is within normal limits. No acute osseous abnormalities are seen.  IMPRESSION: Peribronchial thickening noted.  Lungs otherwise grossly clear.   Electronically Signed   By: Garald Balding M.D.   On: 03/02/2017 00:14    ------------------------------------------------------------------------------------------------------------------  Thank  you for allowing [facility] to assist in the care of your patient. Our recommendations are noted above.  Please  contact us if we can be of further service.   Marda Stalker, MD.  [facility] Office Number: [phone]  Patricia Pesa, M.D.  Merton Border, M.D  09/13/2017

## 2017-10-11 ENCOUNTER — Encounter: Payer: Self-pay | Admitting: Cardiovascular Disease

## 2017-10-11 ENCOUNTER — Ambulatory Visit (INDEPENDENT_AMBULATORY_CARE_PROVIDER_SITE_OTHER): Payer: Medicare Other | Admitting: Cardiovascular Disease

## 2017-10-11 VITALS — BP 114/60 | HR 55 | Ht 60.0 in | Wt 88.2 lb

## 2017-10-11 DIAGNOSIS — I25118 Atherosclerotic heart disease of native coronary artery with other forms of angina pectoris: Secondary | ICD-10-CM

## 2017-10-11 DIAGNOSIS — I10 Essential (primary) hypertension: Secondary | ICD-10-CM | POA: Diagnosis not present

## 2017-10-11 DIAGNOSIS — E785 Hyperlipidemia, unspecified: Secondary | ICD-10-CM | POA: Diagnosis not present

## 2017-10-11 MED ORDER — LISINOPRIL 5 MG PO TABS: 5.0000 mg | ORAL_TABLET | Freq: Every day | ORAL | 5 refills | Status: DC

## 2017-10-11 NOTE — Patient Instructions (Addendum)
Medication Instructions:  Your physician has recommended you make the following change in your medication:  STOP taking lopressor (metoprolol tartrate) INCREASE lisinopril to 5mg  once daily (1 full tablet)   Labwork: none  Testing/Procedures: none  Follow-Up: Your physician wants you to follow-up in: 6 months with Dr. Fletcher Anon.  You will receive a reminder letter in the mail two months in advance. If you don't receive a letter, please call our office to schedule the follow-up appointment.    Any Other Special Instructions Will Be Listed Below (If Applicable).     If you need a refill on your cardiac medications before your next appointment, please call your pharmacy.

## 2017-10-11 NOTE — Progress Notes (Signed)
Cardiology Office Note   Date:  1/61/0960   ID:  Christina Hester, DOB 04/14/1941, MRN [id number]  PCP:  Lavera Guise, MD  Cardiologist:   Kathlyn Sacramento, MD   Chief Complaint  Patient presents with  . other    Past due 3 month follow up. Patient c/o SOB when stressing. Patient was last seen 04/2017. Meds reviewed verbally with patient.       History of Present Illness: Christina Hester is a 77 y.o. female who presents for a follow-up visit regarding coronary artery disease.   She has known history of COPD , prolonged tobacco use, moderate bilateral carotid disease and abdominal aortic aneurysm followed by [facility]..   Cardiac catheterization was done in August 2018 which showed severe one-vessel coronary artery disease with chronically occluded right coronary artery with well-developed left-to-right collaterals. There was mild to moderate nonobstructive disease affecting the left coronary arteries with extremely tortuous vessels. Ejection fraction was normal with mildly elevated left ventricular end-diastolic pressure. She is intolerant to aspirin due to ringing in her ears.  She has been on Plavix.  She refuses to take statins due to fear of side effects and was started on Zetia during most recent visit in October.  However, she stopped taking Zetia recently due to leg pain. She was hospitalized in December with COPD exacerbation.  She quit smoking after that. She has been doing reasonably well with no recent chest pain.  She reports that her blood pressure is high at home during morning hours.   Past Medical History:  Diagnosis Date  . AAA (abdominal aortic aneurysm) ([place])    a. 05/2016 Abd U/S: 3.02 x 3.02 AAA.  Marland Kitchen CAD (coronary artery disease)    a. 02/2017 NSTEMI/Cath: LM nl, LAD mild dzs, D2 min irregs, LCX mild dzs, OM2 50ost, RCA 95p, 122m, L-->R collats, EF 55-65%.  Marland Kitchen COPD (chronic obstructive pulmonary disease) ([place])   . Diastolic dysfunction    a. 02/2017 Echo: EF 55-60%,  gr1 DD, ? inf HK, mild MR.  Marland Kitchen Hyperlipidemia   . Hypertension   . PAD (peripheral artery disease) ([place])    a. 05/2016 ABI's: R 1.22, L 1.11.  . Tobacco abuse     Past Surgical History:  Procedure Laterality Date  . ABDOMINAL HYSTERECTOMY    . BREAST EXCISIONAL BIOPSY Left 20+ YRS AGO   NEG  . LEFT HEART CATH Bilateral 03/05/2017   Procedure: Left Heart Cath with poss PCI;  Surgeon: Wellington Hampshire, MD;  Location: [facility];  Service: Cardiovascular;  Laterality: Bilateral;     Current Outpatient Medications  Medication Sig Dispense Refill  . amLODipine (NORVASC) 5 MG tablet Take one tab po qhs 30 tablet 6  . azithromycin (ZITHROMAX) 250 MG tablet Take every Monday, Wednesday, Friday. 13 each 5  . buPROPion (WELLBUTRIN) 100 MG tablet Take 100 mg by mouth daily.    . clopidogrel (PLAVIX) 75 MG tablet Take 1 tablet (75 mg total) by mouth daily. (Patient taking differently: Take 75 mg by mouth at bedtime. ) 90 tablet 3  . clotrimazole (MYCELEX) 10 MG troche Take 4 to 5 times a  Day as needed fot thrush 70 tablet 0  . fluticasone (FLOVENT HFA) 110 MCG/ACT inhaler Inhale 2 puffs into the lungs 2 (two) times daily. 1 Inhaler 12  . ipratropium-albuterol (DUONEB) 0.5-2.5 (3) MG/3ML SOLN Take 3 mLs by nebulization every 4 (four) hours. 360 mL 3  . lisinopril (PRINIVIL,ZESTRIL) 5 MG tablet  Take 1 tablet (5 mg total) by mouth daily. 30 tablet 5  . LORazepam (ATIVAN) 0.5 MG tablet Half to one tab at night 30 tablet 2  . metoprolol succinate (TOPROL-XL) 50 MG 24 hr tablet Take 50 mg by mouth daily. Take with or immediately following a meal.    . nitroGLYCERIN (NITROSTAT) 0.4 MG SL tablet Place 1 tablet (0.4 mg total) under the tongue every 5 (five) minutes as needed for chest pain. 20 tablet 12  . Tiotropium Bromide Monohydrate (SPIRIVA RESPIMAT) 2.5 MCG/ACT AERS Inhale 2 puffs into the lungs daily. 1 Inhaler 5   No current facility-administered medications for this visit.      Allergies:   Aspirin; Prednisone; Sulfa antibiotics; and Symbicort [budesonide-formoterol fumarate]    Social History:  The patient  reports that she quit smoking about 2 months ago. She smoked 0.25 packs per day. She has never used smokeless tobacco. She reports that she does not drink alcohol or use drugs.   Family History:  The patient's family history includes Breast cancer in her maternal aunt, maternal aunt, maternal aunt, and mother.    ROS:  Please see the history of present illness.   Otherwise, review of systems are positive for none.   All other systems are reviewed and negative.    PHYSICAL EXAM: VS:  BP 114/60 (BP Location: Left Arm, Patient Position: Sitting, Cuff Size: Normal)   Pulse (!) 55   Ht 5' (1.524 m)   Wt 88 lb 4 oz (40 kg)   BMI 17.24 kg/m  , BMI Body mass index is 17.24 kg/m. GEN: Well nourished, well developed, in no acute distress  HEENT: normal  Neck: no JVD, carotid bruits, or masses Cardiac: RRR; no murmurs, rubs, or gallops,no edema  Respiratory:  clear to auscultation bilaterally, normal work of breathing GI: soft, nontender, nondistended, + BS MS: no deformity or atrophy  Skin: warm and dry, no rash Neuro:  Strength and sensation are intact Psych: euthymic mood, full affect Right radial pulse is normal with no hematoma.  EKG:  EKG is ordered today. The ekg ordered today demonstrates sinus bradycardia with PACs.   Recent Labs: 07/14/2017: ALT 16; B Natriuretic Peptide 195.0; TSH 1.504 07/16/2017: Hemoglobin 10.4; Magnesium 1.9; Platelets 241 08/14/2017: BUN 15; Creatinine, Ser 0.85; Potassium 4.6; Sodium 137    Lipid Panel    Component Value Date/Time   CHOL 219 (H) 11/26/2015 0412   TRIG 31 11/26/2015 0412   HDL 85 11/26/2015 0412   CHOLHDL 2.6 11/26/2015 0412   VLDL 6 11/26/2015 0412   LDLCALC 128 (H) 11/26/2015 0412      Wt Readings from Last 3 Encounters:  10/11/17 88 lb 4 oz (40 kg)  09/13/17 84 lb (38.1 kg)  09/04/17  86 lb 9.6 oz (39.3 kg)      No flowsheet data found.    ASSESSMENT AND PLAN:  1.  Coronary artery disease involving native coronary arteries with other forms of angina: Symptoms are well controlled with metoprolol and amlodipine.  Continue same medications.  2. Hyperlipidemia: She continues to refuse statins due to fear of side effects.  She stopped taking Zetia due to atypical leg pain.  I discussed with her the option of a PC SK 9 inhibitor but she does not want any injections.  3. Tobacco use: She quit smoking after her hospitalization in December.    4. Essential hypertension: Blood pressure is controlled here but elevated at home.  She has been taking both Toprol  and Lopressor.  She is mildly bradycardic.  I discontinued Lopressor today and increase lisinopril to 5 mg daily.  The dose can be increased if needed.    Disposition:   FU with me in 6 months  Signed,  Kathlyn Sacramento, MD  10/11/2017 11:01 AM    [place]

## 2017-10-15 ENCOUNTER — Other Ambulatory Visit: Payer: Self-pay | Admitting: Cardiovascular Disease

## 2017-10-23 ENCOUNTER — Telehealth: Payer: Self-pay | Admitting: Cardiovascular Disease

## 2017-10-23 ENCOUNTER — Other Ambulatory Visit: Payer: Self-pay

## 2017-10-23 MED ORDER — METOPROLOL SUCCINATE ER 50 MG PO TB24: 50.0000 mg | ORAL_TABLET | Freq: Every day | ORAL | 0 refills | Status: DC

## 2017-10-23 NOTE — Telephone Encounter (Signed)
New Message  Pt verbalized she was instructed by Dr. Fletcher Anon to start taking this medication but rx has been sent and pt verbalized she is currently out of the other medication and the pharmacy sent over several request but no rx was filled.   *STAT* If patient is at the pharmacy, call can be transferred to refill team.   1. Which medications need to be refilled? (please list name of each medication and dose if known)  metoprolol succinate (TOProl-XL) 50 mg 24hr tablet once daily with or immediately following a meal  2. Which pharmacy/location (including street and city if local pharmacy) is medication to be sent to? [facility], [address], [place], [place]  3. Do they need a 30 day or 90 day supply? 30 days supply

## 2017-10-23 NOTE — Telephone Encounter (Signed)
Refill Sent to Pharmacy:  metoprolol succinate (TOPROL-XL) 50 MG 24 hr tablet 30 tablet 0 10/23/2017    Sig - Route: Take 1 tablet (50 mg total) by mouth daily. Take with or immediately following a meal. - Oral   Sent to pharmacy as: metoprolol succinate (TOPROL-XL) 50 MG 24 hr tablet   E-Prescribing Status: Receipt confirmed by pharmacy (10/23/2017 12:05 PM EDT)   [place], [place]

## 2017-11-01 ENCOUNTER — Ambulatory Visit
Admission: RE | Admit: 2017-11-01 | Discharge: 2017-11-01 | Disposition: A | Discharge status: Home / Self Care | Payer: Medicare Other | Source: Ambulatory Visit | Attending: Internal Medicine | Admitting: Internal Medicine

## 2017-11-01 DIAGNOSIS — Z1239 Encounter for other screening for malignant neoplasm of breast: Secondary | ICD-10-CM

## 2017-11-01 DIAGNOSIS — Z1231 Encounter for screening mammogram for malignant neoplasm of breast: Secondary | ICD-10-CM | POA: Insufficient documentation

## 2017-11-17 ENCOUNTER — Other Ambulatory Visit: Payer: Self-pay | Admitting: Cardiovascular Disease

## 2017-11-19 ENCOUNTER — Other Ambulatory Visit: Payer: Self-pay

## 2017-11-19 MED ORDER — BUPROPION HCL 100 MG PO TABS: 100.0000 mg | ORAL_TABLET | Freq: Every day | ORAL | 1 refills | Status: DC

## 2017-11-19 MED ORDER — BUPROPION HCL ER (SR) 100 MG PO TB12: 100.0000 mg | ORAL_TABLET | Freq: Every day | ORAL | 1 refills | Status: DC

## 2017-12-13 DIAGNOSIS — H43313 Vitreous membranes and strands, bilateral: Secondary | ICD-10-CM | POA: Diagnosis not present

## 2017-12-14 ENCOUNTER — Encounter: Payer: Self-pay | Admitting: Internal Medicine

## 2017-12-14 DIAGNOSIS — J441 Chronic obstructive pulmonary disease with (acute) exacerbation: Secondary | ICD-10-CM

## 2017-12-27 ENCOUNTER — Encounter: Payer: Self-pay | Admitting: Internal Medicine

## 2017-12-27 ENCOUNTER — Ambulatory Visit (INDEPENDENT_AMBULATORY_CARE_PROVIDER_SITE_OTHER): Payer: Medicare Other | Admitting: Internal Medicine

## 2017-12-27 ENCOUNTER — Telehealth (INDEPENDENT_AMBULATORY_CARE_PROVIDER_SITE_OTHER): Payer: Self-pay

## 2017-12-27 VITALS — BP 118/82 | HR 70 | Resp 16 | Ht 60.0 in | Wt 84.4 lb

## 2017-12-27 DIAGNOSIS — M545 Low back pain: Secondary | ICD-10-CM | POA: Diagnosis not present

## 2017-12-27 DIAGNOSIS — J449 Chronic obstructive pulmonary disease, unspecified: Secondary | ICD-10-CM | POA: Diagnosis not present

## 2017-12-27 DIAGNOSIS — Z0001 Encounter for general adult medical examination with abnormal findings: Secondary | ICD-10-CM | POA: Diagnosis not present

## 2017-12-27 DIAGNOSIS — B370 Candidal stomatitis: Secondary | ICD-10-CM | POA: Diagnosis not present

## 2017-12-27 DIAGNOSIS — I10 Essential (primary) hypertension: Secondary | ICD-10-CM

## 2017-12-27 DIAGNOSIS — R300 Dysuria: Secondary | ICD-10-CM

## 2017-12-27 DIAGNOSIS — G8929 Other chronic pain: Secondary | ICD-10-CM

## 2017-12-27 MED ORDER — IPRATROPIUM-ALBUTEROL 0.5-2.5 (3) MG/3ML IN SOLN: 3.0000 mL | RESPIRATORY_TRACT | 3 refills | Status: DC

## 2017-12-27 MED ORDER — NYSTATIN 100000 UNIT/ML MT SUSP: 5.0000 mL | Freq: Four times a day (QID) | OROMUCOSAL | 1 refills | Status: DC

## 2017-12-27 MED ORDER — TIOTROPIUM BROMIDE MONOHYDRATE 2.5 MCG/ACT IN AERS: 1.0000 | INHALATION_SPRAY | Freq: Every day | RESPIRATORY_TRACT | 5 refills | Status: DC

## 2017-12-27 MED ORDER — FLUTICASONE PROPIONATE HFA 110 MCG/ACT IN AERO: 2.0000 | INHALATION_SPRAY | Freq: Two times a day (BID) | RESPIRATORY_TRACT | 12 refills | Status: DC

## 2017-12-27 MED ORDER — ALBUTEROL SULFATE HFA 108 (90 BASE) MCG/ACT IN AERS
2.0000 | INHALATION_SPRAY | Freq: Four times a day (QID) | RESPIRATORY_TRACT | 0 refills | Status: DC | PRN
Start: 2017-12-27 — End: 2018-04-05

## 2017-12-27 NOTE — Progress Notes (Signed)
[facility] [place], [place] [address]  Internal MEDICINE  Office Visit Note  Patient Name: Christina Hester  [id number]  [id number]  Date of Service: 12/31/2017  Chief Complaint  Patient presents with  . medicare annual visit   HPI Pt is here for routine health maintenance examination She feels to her baseline. Breathing is better since her compliance with medications is better. She is still smoking few. C/O metallic taste in her mouth after using her inhalers. Back pain when she is more active and since her breathing is better too  Current Medication: Outpatient Encounter Medications as of 12/27/2017  Medication Sig  . amLODipine (NORVASC) 5 MG tablet Take one tab po qhs  . azithromycin (ZITHROMAX) 250 MG tablet Take every Monday, Wednesday, Friday.  Marland Kitchen buPROPion (WELLBUTRIN SR) 100 MG 12 hr tablet Take 1 tablet (100 mg total) by mouth daily.  . clopidogrel (PLAVIX) 75 MG tablet Take 1 tablet (75 mg total) by mouth daily.  . clotrimazole (MYCELEX) 10 MG troche Take 4 to 5 times a  Day as needed fot thrush  . ipratropium-albuterol (DUONEB) 0.5-2.5 (3) MG/3ML SOLN Take 3 mLs by nebulization every 4 (four) hours.  Marland Kitchen lisinopril (PRINIVIL,ZESTRIL) 5 MG tablet Take 1 tablet (5 mg total) by mouth daily.  Marland Kitchen LORazepam (ATIVAN) 0.5 MG tablet Half to one tab at night  . metoprolol succinate (TOPROL-XL) 50 MG 24 hr tablet TAKE 1 TABLET BY MOUTH ONCE A DAY ** TAKE WITH OR IMMEDIATELY FOLLOWING A MEAL  . nitroGLYCERIN (NITROSTAT) 0.4 MG SL tablet Place 1 tablet (0.4 mg total) under the tongue every 5 (five) minutes as needed for chest pain.  . Tiotropium Bromide Monohydrate (SPIRIVA RESPIMAT) 2.5 MCG/ACT AERS Inhale 1 puff into the lungs daily.  . [DISCONTINUED] ipratropium-albuterol (DUONEB) 0.5-2.5 (3) MG/3ML SOLN Take 3 mLs by nebulization every 4 (four) hours.  . [DISCONTINUED] Tiotropium Bromide Monohydrate (SPIRIVA RESPIMAT) 2.5 MCG/ACT AERS Inhale 2 puffs into  the lungs daily.  Marland Kitchen albuterol (PROVENTIL HFA;VENTOLIN HFA) 108 (90 Base) MCG/ACT inhaler Inhale 2 puffs into the lungs every 6 (six) hours as needed for wheezing or shortness of breath.  . fluticasone (FLOVENT HFA) 110 MCG/ACT inhaler Inhale 2 puffs into the lungs 2 (two) times daily.  Marland Kitchen nystatin (MYCOSTATIN) 100000 UNIT/ML suspension Take 5 mLs (500,000 Units total) by mouth 4 (four) times daily.  . [DISCONTINUED] fluticasone (FLOVENT HFA) 110 MCG/ACT inhaler Inhale 2 puffs into the lungs 2 (two) times daily. (Patient not taking: Reported on 12/27/2017)   No facility-administered encounter medications on file as of 12/27/2017.     Surgical History: Past Surgical History:  Procedure Laterality Date  . ABDOMINAL HYSTERECTOMY    . BREAST EXCISIONAL BIOPSY Left 80's or 90's   NEG  . LEFT HEART CATH Bilateral 03/05/2017   Procedure: Left Heart Cath with poss PCI;  Surgeon: Wellington Hampshire, MD;  Location: [facility];  Service: Cardiovascular;  Laterality: Bilateral;    Medical History: Past Medical History:  Diagnosis Date  . AAA (abdominal aortic aneurysm) ([place])    a. 05/2016 Abd U/S: 3.02 x 3.02 AAA.  Marland Kitchen CAD (coronary artery disease)    a. 02/2017 NSTEMI/Cath: LM nl, LAD mild dzs, D2 min irregs, LCX mild dzs, OM2 50ost, RCA 95p, 11m, L-->R collats, EF 55-65%.  Marland Kitchen COPD (chronic obstructive pulmonary disease) ([place])   . Diastolic dysfunction    a. 02/2017 Echo: EF 55-60%, gr1 DD, ? inf HK, mild MR.  Marland Kitchen Hyperlipidemia   .  Hypertension   . PAD (peripheral artery disease) ([place])    a. 05/2016 ABI's: R 1.22, L 1.11.  . Tobacco abuse     Family History: Family History  Problem Relation Age of Onset  . Breast cancer Mother   . Breast cancer Maternal Aunt   . Breast cancer Maternal Aunt   . Breast cancer Maternal Aunt    Review of Systems  Constitutional: Negative for chills, fatigue and unexpected weight change.  HENT: Positive for postnasal drip. Negative for congestion,  rhinorrhea, sneezing and sore throat.   Eyes: Negative for redness.  Respiratory: Negative for cough, chest tightness and shortness of breath.   Cardiovascular: Negative for chest pain and palpitations.  Gastrointestinal: Positive for abdominal pain. Negative for constipation, diarrhea, nausea and vomiting.  Genitourinary: Negative for dysuria and frequency.  Musculoskeletal: Negative for arthralgias, back pain, joint swelling and neck pain.  Skin: Negative for rash.  Neurological: Negative.  Negative for tremors and numbness.  Hematological: Negative for adenopathy. Does not bruise/bleed easily.  Psychiatric/Behavioral: Negative for behavioral problems (Depression), sleep disturbance and suicidal ideas. The patient is not nervous/anxious.    Vital Signs: BP 118/82   Pulse 70   Resp 16   Ht 5' (1.524 m)   Wt 84 lb 6.4 oz (38.3 kg)   SpO2 93%   BMI 16.48 kg/m   Physical Exam  Constitutional: She is oriented to person, place, and time. She appears well-developed and well-nourished. No distress.  HENT:  Head: Normocephalic and atraumatic.  Mouth/Throat: Oropharynx is clear and moist. No oropharyngeal exudate.  Eyes: Pupils are equal, round, and reactive to light. EOM are normal.  Neck: Normal range of motion. Neck supple. No JVD present. No tracheal deviation present. No thyromegaly present.  Cardiovascular: Normal rate, regular rhythm and normal heart sounds. Exam reveals no gallop and no friction rub.  No murmur heard. Pulmonary/Chest: Effort normal. No respiratory distress. She has no wheezes. She has no rales. She exhibits no tenderness.  Abdominal: Soft. Bowel sounds are normal.  Musculoskeletal: Normal range of motion.  Lymphadenopathy:    She has no cervical adenopathy.  Neurological: She is alert and oriented to person, place, and time. No cranial nerve deficit.  Skin: Skin is warm and dry. She is not diaphoretic.  Psychiatric: She has a normal mood and affect. Her behavior  is normal. Judgment and thought content normal.   Assessment/Plan: 1. Encounter for general adult medical examination with abnormal findings - All medications are updated  2. Dysuria - Urinalysis, Routine w reflex microscopic  3. COPD with chronic bronchitis and emphysema ([place]) - Controlled with medications - fluticasone (FLOVENT HFA) 110 MCG/ACT inhaler; Inhale 2 puffs into the lungs 2 (two) times daily.  Dispense: 1 Inhaler; Refill: 12 - ipratropium-albuterol (DUONEB) 0.5-2.5 (3) MG/3ML SOLN; Take 3 mLs by nebulization every 4 (four) hours.  Dispense: 360 mL; Refill: 3 - albuterol (PROVENTIL HFA;VENTOLIN HFA) 108 (90 Base) MCG/ACT inhaler; Inhale 2 puffs into the lungs every 6 (six) hours as needed for wheezing or shortness of breath.  Dispense: 1 Inhaler; Refill: 0 - Tiotropium Bromide Monohydrate (SPIRIVA RESPIMAT) 2.5 MCG/ACT AERS; Inhale 1 puff into the lungs daily.  Dispense: 30 Inhaler; Refill: 5 - Pneumococcal polysaccharide vaccine 23-valent greater than or equal to 2yo subcutaneous/IM - CBC with Differential/Platelet - T4, free - Comprehensive metabolic panel  4. Essential hypertension, benign - Lipid Panel With LDL/HDL Ratio - TSH - T4, free - Continue Amlodipine, Toprol and lisinopril  5. Chronic bilateral low  back pain without sciatica - She is stable with Tylenol arthritis prn   6. Oral thrush - nystatin (MYCOSTATIN) 100000 UNIT/ML suspension; Take 5 mLs (500,000 Units total) by mouth 4 (four) times daily.  Dispense: 180 mL; Refill: 1  General Counseling: Sargun verbalizes understanding of the findings of todays visit and agrees with plan of treatment. I have discussed any further diagnostic evaluation that may be needed or ordered today. We also reviewed her medications today. she has been encouraged to call the office with any questions or concerns that should arise related to todays visit.   Orders Placed This Encounter  Procedures  . Microscopic Examination  .  Pneumococcal polysaccharide vaccine 23-valent greater than or equal to 2yo subcutaneous/IM  . Urinalysis, Routine w reflex microscopic  . CBC with Differential/Platelet  . Lipid Panel With LDL/HDL Ratio  . TSH  . T4, free  . Comprehensive metabolic panel     Time DUKGU:54 [place], MD  Internal Medicine

## 2017-12-27 NOTE — Telephone Encounter (Signed)
Patient walked in today to ask if she could have her Carotid ultrasound repeated with the rest of her studies that are to be completed in November?   She states that she had a carotid done previously at her Cardiologist office and they sent her to see [place] and she was told by Maudie Mercury that she would need to have the testing done again here at the next visit so that we could have a baseline?

## 2017-12-28 ENCOUNTER — Other Ambulatory Visit (INDEPENDENT_AMBULATORY_CARE_PROVIDER_SITE_OTHER): Payer: Self-pay

## 2017-12-28 DIAGNOSIS — I6523 Occlusion and stenosis of bilateral carotid arteries: Secondary | ICD-10-CM

## 2017-12-28 LAB — URINALYSIS, ROUTINE W REFLEX MICROSCOPIC
BILIRUBIN UA: NEGATIVE
GLUCOSE, UA: NEGATIVE
Ketones, UA: NEGATIVE
Nitrite, UA: NEGATIVE
PROTEIN UA: NEGATIVE
RBC UA: NEGATIVE
Specific Gravity, UA: 1.013 (ref 1.005–1.030)
UUROB: 0.2 mg/dL (ref 0.2–1.0)
pH, UA: 5.5 (ref 5.0–7.5)

## 2017-12-28 LAB — MICROSCOPIC EXAMINATION: Bacteria, UA: NONE SEEN

## 2017-12-28 NOTE — Telephone Encounter (Signed)
I am going to get her bilateral carotid ultrasound scheduled for later this year, and the patient will follow up once the ultrasound is completed.

## 2017-12-31 ENCOUNTER — Encounter: Payer: Self-pay | Admitting: Internal Medicine

## 2018-01-02 ENCOUNTER — Encounter: Payer: Self-pay | Admitting: Internal Medicine

## 2018-01-07 DIAGNOSIS — I10 Essential (primary) hypertension: Secondary | ICD-10-CM | POA: Diagnosis not present

## 2018-01-07 DIAGNOSIS — J449 Chronic obstructive pulmonary disease, unspecified: Secondary | ICD-10-CM | POA: Diagnosis not present

## 2018-01-08 LAB — CBC WITH DIFFERENTIAL/PLATELET
Basophils Absolute: 0 10*3/uL (ref 0.0–0.2)
Basos: 1 %
EOS (ABSOLUTE): 0.2 10*3/uL (ref 0.0–0.4)
EOS: 3 %
HEMATOCRIT: 36.7 % (ref 34.0–46.6)
Hemoglobin: 12.7 g/dL (ref 11.1–15.9)
IMMATURE GRANS (ABS): 0 10*3/uL (ref 0.0–0.1)
IMMATURE GRANULOCYTES: 0 %
LYMPHS: 36 %
Lymphocytes Absolute: 2.2 10*3/uL (ref 0.7–3.1)
MCH: 30.8 pg (ref 26.6–33.0)
MCHC: 34.6 g/dL (ref 31.5–35.7)
MCV: 89 fL (ref 79–97)
MONOS ABS: 0.8 10*3/uL (ref 0.1–0.9)
Monocytes: 13 %
NEUTROS PCT: 47 %
Neutrophils Absolute: 2.9 10*3/uL (ref 1.4–7.0)
PLATELETS: 248 10*3/uL (ref 150–450)
RBC: 4.12 x10E6/uL (ref 3.77–5.28)
RDW: 13.7 % (ref 12.3–15.4)
WBC: 6.1 10*3/uL (ref 3.4–10.8)

## 2018-01-08 LAB — COMPREHENSIVE METABOLIC PANEL
ALBUMIN: 4.7 g/dL (ref 3.5–4.8)
ALT: 16 IU/L (ref 0–32)
AST: 20 IU/L (ref 0–40)
Albumin/Globulin Ratio: 2 (ref 1.2–2.2)
Alkaline Phosphatase: 102 IU/L (ref 39–117)
BUN / CREAT RATIO: 14 (ref 12–28)
BUN: 12 mg/dL (ref 8–27)
Bilirubin Total: 0.4 mg/dL (ref 0.0–1.2)
CALCIUM: 9.8 mg/dL (ref 8.7–10.3)
CO2: 23 mmol/L (ref 20–29)
Chloride: 95 mmol/L — ABNORMAL LOW (ref 96–106)
Creatinine, Ser: 0.86 mg/dL (ref 0.57–1.00)
GFR, EST AFRICAN AMERICAN: 76 mL/min/{1.73_m2} (ref >59)
GFR, EST NON AFRICAN AMERICAN: 66 mL/min/{1.73_m2} (ref >59)
GLOBULIN, TOTAL: 2.4 g/dL (ref 1.5–4.5)
Glucose: 92 mg/dL (ref 65–99)
POTASSIUM: 4.7 mmol/L (ref 3.5–5.2)
SODIUM: 135 mmol/L (ref 134–144)
Total Protein: 7.1 g/dL (ref 6.0–8.5)

## 2018-01-08 LAB — LIPID PANEL WITH LDL/HDL RATIO
Cholesterol, Total: 253 mg/dL — ABNORMAL HIGH (ref 100–199)
HDL: 94 mg/dL (ref >39)
LDL Calculated: 138 mg/dL — ABNORMAL HIGH (ref 0–99)
LDl/HDL Ratio: 1.5 ratio (ref 0.0–3.2)
TRIGLYCERIDES: 104 mg/dL (ref 0–149)
VLDL Cholesterol Cal: 21 mg/dL (ref 5–40)

## 2018-01-08 LAB — TSH: TSH: 2.5 u[IU]/mL (ref 0.450–4.500)

## 2018-01-08 LAB — T4, FREE: Free T4: 1.28 ng/dL (ref 0.82–1.77)

## 2018-01-29 ENCOUNTER — Telehealth: Payer: Self-pay | Admitting: Internal Medicine

## 2018-01-29 NOTE — Telephone Encounter (Signed)
Pt left message and is wondering how her blood work was?

## 2018-01-30 ENCOUNTER — Telehealth: Payer: Self-pay | Admitting: Internal Medicine

## 2018-01-30 NOTE — Telephone Encounter (Signed)
Pt notified of labs

## 2018-01-30 NOTE — Telephone Encounter (Signed)
Pt was notified of labs

## 2018-02-19 ENCOUNTER — Other Ambulatory Visit: Payer: Self-pay | Admitting: Cardiovascular Disease

## 2018-02-25 ENCOUNTER — Other Ambulatory Visit: Payer: Self-pay

## 2018-02-25 MED ORDER — CLOPIDOGREL BISULFATE 75 MG PO TABS: 75.0000 mg | ORAL_TABLET | Freq: Every day | ORAL | 3 refills | Status: DC

## 2018-02-25 NOTE — Telephone Encounter (Signed)
*  STAT* If patient is at the pharmacy, call can be transferred to refill team.   1. Which medications need to be refilled? (please list name of each medication and dose if known) Plavix  2. Which pharmacy/location (including street and city if local pharmacy) is medication to be sent to? [place]  3. Do they need a 30 day or 90 day supply? [place]

## 2018-03-04 ENCOUNTER — Ambulatory Visit
Admission: RE | Admit: 2018-03-04 | Discharge: 2018-03-04 | Disposition: A | Discharge status: Home / Self Care | Payer: Medicare Other | Source: Ambulatory Visit | Attending: Internal Medicine | Admitting: Internal Medicine

## 2018-03-04 ENCOUNTER — Ambulatory Visit (INDEPENDENT_AMBULATORY_CARE_PROVIDER_SITE_OTHER): Payer: Medicare Other | Admitting: Internal Medicine

## 2018-03-04 ENCOUNTER — Encounter: Payer: Self-pay | Admitting: Internal Medicine

## 2018-03-04 VITALS — BP 130/78 | HR 88 | Temp 98.6°F | Resp 18 | Ht 60.0 in | Wt 83.4 lb

## 2018-03-04 DIAGNOSIS — J440 Chronic obstructive pulmonary disease with acute lower respiratory infection: Secondary | ICD-10-CM

## 2018-03-04 DIAGNOSIS — R0602 Shortness of breath: Secondary | ICD-10-CM

## 2018-03-04 DIAGNOSIS — J209 Acute bronchitis, unspecified: Secondary | ICD-10-CM

## 2018-03-04 DIAGNOSIS — I6523 Occlusion and stenosis of bilateral carotid arteries: Secondary | ICD-10-CM

## 2018-03-04 DIAGNOSIS — R05 Cough: Secondary | ICD-10-CM | POA: Diagnosis not present

## 2018-03-04 DIAGNOSIS — I2510 Atherosclerotic heart disease of native coronary artery without angina pectoris: Secondary | ICD-10-CM | POA: Diagnosis not present

## 2018-03-04 DIAGNOSIS — I2583 Coronary atherosclerosis due to lipid rich plaque: Secondary | ICD-10-CM

## 2018-03-04 MED ORDER — IPRATROPIUM-ALBUTEROL 0.5-2.5 (3) MG/3ML IN SOLN
3.0000 mL | Freq: Once | RESPIRATORY_TRACT | Status: AC
  Administered 2018-03-04: 3 mL via RESPIRATORY_TRACT

## 2018-03-04 MED ORDER — LEVOFLOXACIN 500 MG PO TABS: 500.0000 mg | ORAL_TABLET | Freq: Every day | ORAL | 0 refills | Status: DC

## 2018-03-04 NOTE — Progress Notes (Signed)
[facility] [place], [place] [address]  Pulmonary Sleep Medicine   Office Visit Note  Patient Name: Christina Hester DOB: 1941-07-04 MRN [id number]  Date of Service: 03/04/2018  Complaints/HPI: She is here for follow-up.  She is been complaining about having some cough and some shortness of breath along with some wheezing.  No fevers or chills are noted.  She denies having any chest pain.  She had no palpitations noted.  States cough is a little bit worse than her baseline.  Unfortunately she continues to smoke.  I spoke to her about smoking cessation again and suggested to her strongly that if she were to quit smoking this would significantly help with her cough and issue.  She was in the hospital from the end of December output to early January.  She was seen in February by another pulmonologist for exacerbation of her COPD and at that time was given azithromycin.  ROS  General: (-) fever, (-) chills, (-) night sweats, (-) weakness Skin: (-) rashes, (-) itching,. Eyes: (-) visual changes, (-) redness, (-) itching. Nose and Sinuses: (-) nasal stuffiness or itchiness, (-) postnasal drip, (-) nosebleeds, (-) sinus trouble. Mouth and Throat: (-) sore throat, (-) hoarseness. Neck: (-) swollen glands, (-) enlarged thyroid, (-) neck pain. Respiratory: + cough, (-) bloody sputum, + shortness of breath, + wheezing. Cardiovascular: no  ankle swelling, (-) chest pain. Lymphatic: (-) lymph node enlargement. Neurologic: (-) numbness, (-) tingling. Psychiatric: (-) anxiety, (-) depression   Current Medication: Outpatient Encounter Medications as of 03/04/2018  Medication Sig  . albuterol (PROVENTIL HFA;VENTOLIN HFA) 108 (90 Base) MCG/ACT inhaler Inhale 2 puffs into the lungs every 6 (six) hours as needed for wheezing or shortness of breath.  Marland Kitchen amLODipine (NORVASC) 5 MG tablet Take one tab po qhs  . buPROPion (WELLBUTRIN SR) 100 MG 12 hr tablet Take 1 tablet (100 mg  total) by mouth daily.  . clopidogrel (PLAVIX) 75 MG tablet Take 1 tablet (75 mg total) by mouth daily.  . clotrimazole (MYCELEX) 10 MG troche Take 4 to 5 times a  Day as needed fot thrush  . ezetimibe (ZETIA) 10 MG tablet Take 10 mg by mouth every other day.  . fluticasone (FLOVENT HFA) 110 MCG/ACT inhaler Inhale 2 puffs into the lungs 2 (two) times daily.  Marland Kitchen ipratropium-albuterol (DUONEB) 0.5-2.5 (3) MG/3ML SOLN Take 3 mLs by nebulization every 4 (four) hours.  Marland Kitchen lisinopril (PRINIVIL,ZESTRIL) 5 MG tablet Take 1 tablet (5 mg total) by mouth daily.  Marland Kitchen LORazepam (ATIVAN) 0.5 MG tablet Half to one tab at night  . nitroGLYCERIN (NITROSTAT) 0.4 MG SL tablet Place 1 tablet (0.4 mg total) under the tongue every 5 (five) minutes as needed for chest pain.  Marland Kitchen nystatin (MYCOSTATIN) 100000 UNIT/ML suspension Take 5 mLs (500,000 Units total) by mouth 4 (four) times daily.  . Tiotropium Bromide Monohydrate (SPIRIVA RESPIMAT) 2.5 MCG/ACT AERS Inhale 1 puff into the lungs daily.  Marland Kitchen azithromycin (ZITHROMAX) 250 MG tablet Take every Monday, Wednesday, Friday. (Patient not taking: Reported on 03/04/2018)  . [DISCONTINUED] metoprolol succinate (TOPROL-XL) 50 MG 24 hr tablet TAKE 1 TABLET BY MOUTH ONCE A DAY ** TAKE WITH OR IMMEDIATELY FOLLOWING A MEAL (Patient not taking: Reported on 03/04/2018)   No facility-administered encounter medications on file as of 03/04/2018.     Surgical History: Past Surgical History:  Procedure Laterality Date  . ABDOMINAL HYSTERECTOMY    . BREAST EXCISIONAL BIOPSY Left 80's or 90's   NEG  .  LEFT HEART CATH Bilateral 03/05/2017   Procedure: Left Heart Cath with poss PCI;  Surgeon: Wellington Hampshire, MD;  Location: [facility];  Service: Cardiovascular;  Laterality: Bilateral;    Medical History: Past Medical History:  Diagnosis Date  . AAA (abdominal aortic aneurysm) ([place])    a. 05/2016 Abd U/S: 3.02 x 3.02 AAA.  Marland Kitchen CAD (coronary artery disease)    a. 02/2017  NSTEMI/Cath: LM nl, LAD mild dzs, D2 min irregs, LCX mild dzs, OM2 50ost, RCA 95p, 163m, L-->R collats, EF 55-65%.  Marland Kitchen COPD (chronic obstructive pulmonary disease) ([place])   . Diastolic dysfunction    a. 02/2017 Echo: EF 55-60%, gr1 DD, ? inf HK, mild MR.  Marland Kitchen Hyperlipidemia   . Hypertension   . PAD (peripheral artery disease) ([place])    a. 05/2016 ABI's: R 1.22, L 1.11.  . Tobacco abuse     Family History: Family History  Problem Relation Age of Onset  . Breast cancer Mother   . Breast cancer Maternal Aunt   . Breast cancer Maternal Aunt   . Breast cancer Maternal Aunt     Social History: Social History   Socioeconomic History  . Marital status: Divorced    Spouse name: Not on file  . Number of children: Not on file  . Years of education: Not on file  . Highest education level: Not on file  Occupational History  . Not on file  Social Needs  . Financial resource strain: Not on file  . Food insecurity:    Worry: Not on file    Inability: Not on file  . Transportation needs:    Medical: Not on file    Non-medical: Not on file  Tobacco Use  . Smoking status: Former Smoker    Packs/day: 0.25    Last attempt to quit: 07/12/2017    Years since quitting: 0.6  . Smokeless tobacco: Never Used  . Tobacco comment: currently 90 days without smoking  Substance and Sexual Activity  . Alcohol use: No  . Drug use: No  . Sexual activity: Not on file  Lifestyle  . Physical activity:    Days per week: Not on file    Minutes per session: Not on file  . Stress: Not on file  Relationships  . Social connections:    Talks on phone: Not on file    Gets together: Not on file    Attends religious service: Not on file    Active member of club or organization: Not on file    Attends meetings of clubs or organizations: Not on file    Relationship status: Not on file  . Intimate partner violence:    Fear of current or ex partner: Not on file    Emotionally abused: Not on file    Physically  abused: Not on file    Forced sexual activity: Not on file  Other Topics Concern  . Not on file  Social History Narrative  . Not on file    Vital Signs: Blood pressure 130/78, pulse 88, temperature 98.6 F (37 C), resp. rate 18, height 5' (1.524 m), weight 83 lb 6.4 oz (37.8 kg), SpO2 92 %.  Examination: General Appearance: The patient is well-developed, well-nourished, and in no distress. Skin: Gross inspection of skin unremarkable. Head: normocephalic, no gross deformities. Eyes: no gross deformities noted. ENT: ears appear grossly normal no exudates. Neck: Supple. No thyromegaly. No LAD. Respiratory: few scattered rhonchi are noted. Cardiovascular: Normal S1 and  S2 without murmur or rub. Extremities: No cyanosis. pulses are equal. Neurologic: Alert and oriented. No involuntary movements.  LABS: Recent Results (from the past 2160 hour(s))  Urinalysis, Routine w reflex microscopic     Status: Abnormal   Collection Time: 12/27/17  3:13 PM  Result Value Ref Range   Specific Gravity, UA 1.013 1.005 - 1.030   pH, UA 5.5 5.0 - 7.5   Color, UA Yellow Yellow   Appearance Ur Clear Clear   Leukocytes, UA 1+ (A) Negative   Protein, UA Negative Negative/Trace   Glucose, UA Negative Negative   Ketones, UA Negative Negative   RBC, UA Negative Negative   Bilirubin, UA Negative Negative   Urobilinogen, Ur 0.2 0.2 - 1.0 mg/dL   Nitrite, UA Negative Negative   Microscopic Examination See below:     Comment: Microscopic was indicated and was performed.  Microscopic Examination     Status: Abnormal   Collection Time: 12/27/17  3:13 PM  Result Value Ref Range   WBC, UA 0-5 0 - 5 /hpf   RBC, UA 0-2 0 - 2 /hpf   Epithelial Cells (non renal) 0-10 0 - 10 /hpf   Casts Present (A) None seen /lpf   Cast Type Hyaline casts N/A   Mucus, UA Present Not Estab.   Bacteria, UA None seen None seen/Few  CBC with Differential/Platelet     Status: None   Collection Time: 01/07/18  8:58 AM  Result  Value Ref Range   WBC 6.1 3.4 - 10.8 x10E3/uL   RBC 4.12 3.77 - 5.28 x10E6/uL   Hemoglobin 12.7 11.1 - 15.9 g/dL   Hematocrit 36.7 34.0 - 46.6 %   MCV 89 79 - 97 fL   MCH 30.8 26.6 - 33.0 pg   MCHC 34.6 31.5 - 35.7 g/dL   RDW 13.7 12.3 - 15.4 %   Platelets 248 150 - 450 x10E3/uL   Neutrophils 47 Not Estab. %   Lymphs 36 Not Estab. %   Monocytes 13 Not Estab. %   Eos 3 Not Estab. %   Basos 1 Not Estab. %   Neutrophils Absolute 2.9 1.4 - 7.0 x10E3/uL   Lymphocytes Absolute 2.2 0.7 - 3.1 x10E3/uL   Monocytes Absolute 0.8 0.1 - 0.9 x10E3/uL   EOS (ABSOLUTE) 0.2 0.0 - 0.4 x10E3/uL   Basophils Absolute 0.0 0.0 - 0.2 x10E3/uL   Immature Granulocytes 0 Not Estab. %   Immature Grans (Abs) 0.0 0.0 - 0.1 x10E3/uL  Lipid Panel With LDL/HDL Ratio     Status: Abnormal   Collection Time: 01/07/18  8:58 AM  Result Value Ref Range   Cholesterol, Total 253 (H) 100 - 199 mg/dL   Triglycerides 104 0 - 149 mg/dL   HDL 94 >39 mg/dL   VLDL Cholesterol Cal 21 5 - 40 mg/dL   LDL Calculated 138 (H) 0 - 99 mg/dL   LDl/HDL Ratio 1.5 0.0 - 3.2 ratio    Comment:                                     LDL/HDL Ratio                                             Men  Women  1/2 Avg.Risk  1.0    1.5                                   Avg.Risk  3.6    3.2                                2X Avg.Risk  6.2    5.0                                3X Avg.Risk  8.0    6.1   TSH     Status: None   Collection Time: 01/07/18  8:58 AM  Result Value Ref Range   TSH 2.500 0.450 - 4.500 uIU/mL  T4, free     Status: None   Collection Time: 01/07/18  8:58 AM  Result Value Ref Range   Free T4 1.28 0.82 - 1.77 ng/dL  Comprehensive metabolic panel     Status: Abnormal   Collection Time: 01/07/18  8:58 AM  Result Value Ref Range   Glucose 92 65 - 99 mg/dL   BUN 12 8 - 27 mg/dL   Creatinine, Ser 0.86 0.57 - 1.00 mg/dL   GFR calc non Af Amer 66 >59 mL/min/1.73   GFR calc Af Amer 76 >59 mL/min/1.73    BUN/Creatinine Ratio 14 12 - 28   Sodium 135 134 - 144 mmol/L   Potassium 4.7 3.5 - 5.2 mmol/L   Chloride 95 (L) 96 - 106 mmol/L   CO2 23 20 - 29 mmol/L   Calcium 9.8 8.7 - 10.3 mg/dL   Total Protein 7.1 6.0 - 8.5 g/dL   Albumin 4.7 3.5 - 4.8 g/dL   Globulin, Total 2.4 1.5 - 4.5 g/dL   Albumin/Globulin Ratio 2.0 1.2 - 2.2   Bilirubin Total 0.4 0.0 - 1.2 mg/dL   Alkaline Phosphatase 102 39 - 117 IU/L   AST 20 0 - 40 IU/L   ALT 16 0 - 32 IU/L    Radiology: Mm Screening Breast Tomo Bilateral  Result Date: 11/01/2017 CLINICAL DATA:  Screening. EXAM: DIGITAL SCREENING BILATERAL MAMMOGRAM WITH TOMO AND CAD COMPARISON:  Previous exam(s). ACR Breast Density Category c: The breast tissue is heterogeneously dense, which may obscure small masses. FINDINGS: There are no findings suspicious for malignancy. Images were processed with CAD. IMPRESSION: No mammographic evidence of malignancy. A result letter of this screening mammogram will be mailed directly to the patient. RECOMMENDATION: Screening mammogram in one year. (Code:SM-B-01Y) BI-RADS CATEGORY  1: Negative. Electronically Signed   By: Fidela Salisbury M.D.   On: 11/01/2017 14:02    No results found.  No results found.    Assessment and Plan: Patient Active Problem List   Diagnosis Date Noted  . Acute bronchitis with COPD ([place]) 07/25/2017  . Benign hypertension 07/25/2017  . Carotid stenosis 05/18/2017  . Coronary artery disease 03/06/2017  . S/P cardiac cath 03/06/2017  . Unstable angina ([place]) 03/04/2017  . Demand ischemia ([place]) 03/04/2017  . COPD with acute exacerbation ([place]) 03/03/2017  . Left sided chest pain 03/03/2017  . Multifocal pneumonia 03/03/2017  . Hyponatremia 03/03/2017  . Dehydration 03/03/2017  . Leukocytosis 03/03/2017  . Acute on chronic respiratory failure ([place]) 03/02/2017  . Pneumonia 07/18/2016  . AAA (abdominal aortic aneurysm) without rupture ([place]) 05/17/2016  .  PVD (peripheral vascular disease)  ([place]) 05/17/2016  . Protein-calorie malnutrition, severe 04/29/2016  . Community acquired pneumonia 04/28/2016  . Chest pain 11/25/2015    1. Cough the cough is persistent.  I suggested that we get a chest x-ray which was ordered.  Also will get a spirometry to assess the severity of her underlying COPD 2. COPD with exacerbation she has ongoing exacerbations of her COPD basically because she is still smoking.  As above we will order chest x-ray we will continue with supportive care she was seen by another pulmonologist in February and I have suggested to her that she needs to decide who she would like to continue seeing as it is not a good idea for her to be bouncing back and forth from one physician to another.  I have told her that I have her patient safety in mind when I make this statement 3. CAD at baseline continue following up with her primary cardiologist.  She sees [facility]: I have discussed the findings of the evaluation and examination with Tyann.  I have also discussed any further diagnostic evaluation thatmay be needed or ordered today. Sallye verbalizes understanding of the findings of todays visit. We also reviewed her medications today and discussed drug interactions and side effects including but not limited excessive drowsiness and altered mental states. We also discussed that there is always a risk not just to her but also people around her. she has been encouraged to call the office with any questions or concerns that should arise related to todays visit.  Orders Placed This Encounter  Procedures  . DG Chest 2 View    Standing Status:   Future    Number of Occurrences:   1    Standing Expiration Date:   05/05/2019    Order Specific Question:   Reason for Exam (SYMPTOM  OR DIAGNOSIS REQUIRED)    Answer:   cough    Order Specific Question:   Preferred imaging location?    Answer:   [facility]    Order Specific Question:   Radiology Contrast Protocol -  do NOT remove file path    Answer:   [email or web address]  . Spirometry with Graph    Order Specific Question:   Where should this test be performed?    Answer:   [facility]    Order Specific Question:   Spirometry pre & post bronchodilator    Answer:   Yes     Time spent: 34min  I have personally obtained a history, examined the patient, evaluated laboratory and imaging results, formulated the assessment and plan and placed orders.    Allyne Gee, MD [facility] Pulmonary and Critical Care Sleep medicine

## 2018-03-04 NOTE — Patient Instructions (Signed)

## 2018-03-05 ENCOUNTER — Other Ambulatory Visit: Payer: Self-pay | Admitting: Internal Medicine

## 2018-03-05 DIAGNOSIS — F411 Generalized anxiety disorder: Secondary | ICD-10-CM

## 2018-03-05 NOTE — Telephone Encounter (Signed)
Is this ok?

## 2018-04-01 ENCOUNTER — Ambulatory Visit (INDEPENDENT_AMBULATORY_CARE_PROVIDER_SITE_OTHER): Payer: Medicare Other | Admitting: Internal Medicine

## 2018-04-01 ENCOUNTER — Encounter: Payer: Self-pay | Admitting: Internal Medicine

## 2018-04-01 VITALS — BP 142/78 | HR 81 | Resp 16 | Ht 59.0 in | Wt 83.2 lb

## 2018-04-01 DIAGNOSIS — J440 Chronic obstructive pulmonary disease with acute lower respiratory infection: Secondary | ICD-10-CM | POA: Diagnosis not present

## 2018-04-01 DIAGNOSIS — I6523 Occlusion and stenosis of bilateral carotid arteries: Secondary | ICD-10-CM | POA: Diagnosis not present

## 2018-04-01 DIAGNOSIS — R0602 Shortness of breath: Secondary | ICD-10-CM

## 2018-04-01 DIAGNOSIS — I10 Essential (primary) hypertension: Secondary | ICD-10-CM

## 2018-04-01 DIAGNOSIS — J209 Acute bronchitis, unspecified: Secondary | ICD-10-CM

## 2018-04-01 MED ORDER — AZITHROMYCIN 250 MG PO TABS: ORAL_TABLET | ORAL | 0 refills | Status: DC

## 2018-04-01 NOTE — Progress Notes (Signed)
[facility] [place], [place] [address]  Pulmonary Sleep Medicine   Office Visit Note  Patient Name: Christina Hester DOB: [birth year] MRN [id number]  Date of Service: 04/01/2018  Complaints/HPI: Pt here for follow up copd.  She was seen one month ago and started on antibiotics  And a chest x-ray was performed. She completed those antibiotics and her chest x-ray was unremarkable.  She reports after finishing the antibiotics her symptoms began to slowly come back.  She reports smoking about 1 cigarette each month, but for the most part as quit. She continues to reports a mild productive cough with thick clear/white sputum mostly in the morning.  She has been taking OTC Mucinex at night.  She reports using her rescue inhaler more in the last few weeks.  She also continues to take her Spiriva once a day.    ROS  General: (-) fever, (-) chills, (-) night sweats, (-) weakness Skin: (-) rashes, (-) itching,. Eyes: (-) visual changes, (-) redness, (-) itching. Nose and Sinuses: (-) nasal stuffiness or itchiness, (-) postnasal drip, (-) nosebleeds, (-) sinus trouble. Mouth and Throat: (-) sore throat, (-) hoarseness. Neck: (-) swollen glands, (-) enlarged thyroid, (-) neck pain. Respiratory: + cough, (-) bloody sputum, + shortness of breath, - wheezing. Cardiovascular: - ankle swelling, (-) chest pain. Lymphatic: (-) lymph node enlargement. Neurologic: (-) numbness, (-) tingling. Psychiatric: (-) anxiety, (-) depression   Current Medication: Outpatient Encounter Medications as of 04/01/2018  Medication Sig  . albuterol (PROVENTIL HFA;VENTOLIN HFA) 108 (90 Base) MCG/ACT inhaler Inhale 2 puffs into the lungs every 6 (six) hours as needed for wheezing or shortness of breath.  Marland Kitchen amLODipine (NORVASC) 5 MG tablet Take one tab po qhs  . buPROPion (WELLBUTRIN SR) 100 MG 12 hr tablet Take 1 tablet (100 mg total) by mouth daily.  . clopidogrel (PLAVIX) 75 MG tablet Take 1  tablet (75 mg total) by mouth daily.  . clotrimazole (MYCELEX) 10 MG troche Take 4 to 5 times a  Day as needed fot thrush  . ezetimibe (ZETIA) 10 MG tablet Take 10 mg by mouth every other day.  . fluticasone (FLOVENT HFA) 110 MCG/ACT inhaler Inhale 2 puffs into the lungs 2 (two) times daily.  Marland Kitchen ipratropium-albuterol (DUONEB) 0.5-2.5 (3) MG/3ML SOLN Take 3 mLs by nebulization every 4 (four) hours.  Marland Kitchen levofloxacin (LEVAQUIN) 500 MG tablet Take 1 tablet (500 mg total) by mouth daily.  Marland Kitchen lisinopril (PRINIVIL,ZESTRIL) 5 MG tablet Take 1 tablet (5 mg total) by mouth daily.  Marland Kitchen LORazepam (ATIVAN) 0.5 MG tablet TAKE 1/2 TO 1 TABLET BY MOUTH AT NIGHT  . nitroGLYCERIN (NITROSTAT) 0.4 MG SL tablet Place 1 tablet (0.4 mg total) under the tongue every 5 (five) minutes as needed for chest pain.  Marland Kitchen nystatin (MYCOSTATIN) 100000 UNIT/ML suspension Take 5 mLs (500,000 Units total) by mouth 4 (four) times daily.  . Tiotropium Bromide Monohydrate (SPIRIVA RESPIMAT) 2.5 MCG/ACT AERS Inhale 1 puff into the lungs daily.  Marland Kitchen azithromycin (ZITHROMAX) 250 MG tablet Take every Monday, Wednesday, Friday. (Patient not taking: Reported on 03/04/2018)   No facility-administered encounter medications on file as of 04/01/2018.     Surgical History: Past Surgical History:  Procedure Laterality Date  . ABDOMINAL HYSTERECTOMY    . BREAST EXCISIONAL BIOPSY Left 80's or 90's   NEG  . LEFT HEART CATH Bilateral 03/05/2017   Procedure: Left Heart Cath with poss PCI;  Surgeon: Wellington Hampshire, MD;  Location: [place] [facility];  Service: Cardiovascular;  Laterality: Bilateral;    Medical History: Past Medical History:  Diagnosis Date  . AAA (abdominal aortic aneurysm) ([place])    a. 05/2016 Abd U/S: 3.02 x 3.02 AAA.  Marland Kitchen CAD (coronary artery disease)    a. 02/2017 NSTEMI/Cath: LM nl, LAD mild dzs, D2 min irregs, LCX mild dzs, OM2 50ost, RCA 95p, 131m, L-->R collats, EF 55-65%.  Marland Kitchen COPD (chronic obstructive pulmonary disease) ([place])    . Diastolic dysfunction    a. 02/2017 Echo: EF 55-60%, gr1 DD, ? inf HK, mild MR.  Marland Kitchen Hyperlipidemia   . Hypertension   . PAD (peripheral artery disease) ([place])    a. 05/2016 ABI's: R 1.22, L 1.11.  . Tobacco abuse     Family History: Family History  Problem Relation Age of Onset  . Breast cancer Mother   . Breast cancer Maternal Aunt   . Breast cancer Maternal Aunt   . Breast cancer Maternal Aunt     Social History: Social History   Socioeconomic History  . Marital status: Divorced    Spouse name: Not on file  . Number of children: Not on file  . Years of education: Not on file  . Highest education level: Not on file  Occupational History  . Not on file  Social Needs  . Financial resource strain: Not on file  . Food insecurity:    Worry: Not on file    Inability: Not on file  . Transportation needs:    Medical: Not on file    Non-medical: Not on file  Tobacco Use  . Smoking status: Former Smoker    Packs/day: 0.25    Last attempt to quit: 07/12/2017    Years since quitting: 0.7  . Smokeless tobacco: Never Used  . Tobacco comment: currently 90 days without smoking  Substance and Sexual Activity  . Alcohol use: No  . Drug use: No  . Sexual activity: Not on file  Lifestyle  . Physical activity:    Days per week: Not on file    Minutes per session: Not on file  . Stress: Not on file  Relationships  . Social connections:    Talks on phone: Not on file    Gets together: Not on file    Attends religious service: Not on file    Active member of club or organization: Not on file    Attends meetings of clubs or organizations: Not on file    Relationship status: Not on file  . Intimate partner violence:    Fear of current or ex partner: Not on file    Emotionally abused: Not on file    Physically abused: Not on file    Forced sexual activity: Not on file  Other Topics Concern  . Not on file  Social History Narrative  . Not on file    Vital Signs: Blood  pressure (!) 142/78, pulse 81, resp. rate 16, height 4\' 11"  (1.499 m), weight 83 lb 3.2 oz (37.7 kg), SpO2 94 %.  Examination: General Appearance: The patient is well-developed, well-nourished, and in no distress. Skin: Gross inspection of skin unremarkable. Head: normocephalic, no gross deformities. Eyes: no gross deformities noted. ENT: ears appear grossly normal no exudates. Neck: Supple. No thyromegaly. No LAD. Respiratory: no rhonchi noted. Cardiovascular: Normal S1 and S2 without murmur or rub. Extremities: No cyanosis. pulses are equal. Neurologic: Alert and oriented. No involuntary movements.  LABS: Recent Results (from the past 2160 hour(s))  CBC  with Differential/Platelet     Status: None   Collection Time: 01/07/18  8:58 AM  Result Value Ref Range   WBC 6.1 3.4 - 10.8 x10E3/uL   RBC 4.12 3.77 - 5.28 x10E6/uL   Hemoglobin 12.7 11.1 - 15.9 g/dL   Hematocrit 36.7 34.0 - 46.6 %   MCV 89 79 - 97 fL   MCH 30.8 26.6 - 33.0 pg   MCHC 34.6 31.5 - 35.7 g/dL   RDW 13.7 12.3 - 15.4 %   Platelets 248 150 - 450 x10E3/uL   Neutrophils 47 Not Estab. %   Lymphs 36 Not Estab. %   Monocytes 13 Not Estab. %   Eos 3 Not Estab. %   Basos 1 Not Estab. %   Neutrophils Absolute 2.9 1.4 - 7.0 x10E3/uL   Lymphocytes Absolute 2.2 0.7 - 3.1 x10E3/uL   Monocytes Absolute 0.8 0.1 - 0.9 x10E3/uL   EOS (ABSOLUTE) 0.2 0.0 - 0.4 x10E3/uL   Basophils Absolute 0.0 0.0 - 0.2 x10E3/uL   Immature Granulocytes 0 Not Estab. %   Immature Grans (Abs) 0.0 0.0 - 0.1 x10E3/uL  Lipid Panel With LDL/HDL Ratio     Status: Abnormal   Collection Time: 01/07/18  8:58 AM  Result Value Ref Range   Cholesterol, Total 253 (H) 100 - 199 mg/dL   Triglycerides 104 0 - 149 mg/dL   HDL 94 >39 mg/dL   VLDL Cholesterol Cal 21 5 - 40 mg/dL   LDL Calculated 138 (H) 0 - 99 mg/dL   LDl/HDL Ratio 1.5 0.0 - 3.2 ratio    Comment:                                     LDL/HDL Ratio                                             Men   Women                               1/2 Avg.Risk  1.0    1.5                                   Avg.Risk  3.6    3.2                                2X Avg.Risk  6.2    5.0                                3X Avg.Risk  8.0    6.1   TSH     Status: None   Collection Time: 01/07/18  8:58 AM  Result Value Ref Range   TSH 2.500 0.450 - 4.500 uIU/mL  T4, free     Status: None   Collection Time: 01/07/18  8:58 AM  Result Value Ref Range   Free T4 1.28 0.82 - 1.77 ng/dL  Comprehensive metabolic panel     Status: Abnormal   Collection Time: 01/07/18  8:58 AM  Result Value Ref Range  Glucose 92 65 - 99 mg/dL   BUN 12 8 - 27 mg/dL   Creatinine, Ser 0.86 0.57 - 1.00 mg/dL   GFR calc non Af Amer 66 >59 mL/min/1.73   GFR calc Af Amer 76 >59 mL/min/1.73   BUN/Creatinine Ratio 14 12 - 28   Sodium 135 134 - 144 mmol/L   Potassium 4.7 3.5 - 5.2 mmol/L   Chloride 95 (L) 96 - 106 mmol/L   CO2 23 20 - 29 mmol/L   Calcium 9.8 8.7 - 10.3 mg/dL   Total Protein 7.1 6.0 - 8.5 g/dL   Albumin 4.7 3.5 - 4.8 g/dL   Globulin, Total 2.4 1.5 - 4.5 g/dL   Albumin/Globulin Ratio 2.0 1.2 - 2.2   Bilirubin Total 0.4 0.0 - 1.2 mg/dL   Alkaline Phosphatase 102 39 - 117 IU/L   AST 20 0 - 40 IU/L   ALT 16 0 - 32 IU/L    Radiology: Dg Chest 2 View  Result Date: 03/04/2018 CLINICAL DATA:  Cough.  Acute bronchitis.  COPD.  Former smoker. EXAM: CHEST - 2 VIEW COMPARISON:  07/14/2017 chest radiograph. FINDINGS: Stable cardiomediastinal silhouette with normal heart size. No pneumothorax. No pleural effusion. Hyperinflated lungs. No pulmonary edema. No acute consolidative airspace disease. IMPRESSION: No acute cardiopulmonary disease. Hyperinflated lungs, compatible with the provided history of COPD. Electronically Signed   By: Ilona Sorrel M.D.   On: 03/04/2018 13:51    No results found.  Dg Chest 2 View  Result Date: 03/04/2018 CLINICAL DATA:  Cough.  Acute bronchitis.  COPD.  Former smoker. EXAM: CHEST - 2 VIEW  COMPARISON:  07/14/2017 chest radiograph. FINDINGS: Stable cardiomediastinal silhouette with normal heart size. No pneumothorax. No pleural effusion. Hyperinflated lungs. No pulmonary edema. No acute consolidative airspace disease. IMPRESSION: No acute cardiopulmonary disease. Hyperinflated lungs, compatible with the provided history of COPD. Electronically Signed   By: Ilona Sorrel M.D.   On: 03/04/2018 13:51      Assessment and Plan: Patient Active Problem List   Diagnosis Date Noted  . Acute bronchitis with COPD ([place]) 07/25/2017  . Benign hypertension 07/25/2017  . Carotid stenosis 05/18/2017  . Coronary artery disease 03/06/2017  . S/P cardiac cath 03/06/2017  . Unstable angina ([place]) 03/04/2017  . Demand ischemia ([place]) 03/04/2017  . COPD with acute exacerbation ([place]) 03/03/2017  . Left sided chest pain 03/03/2017  . Multifocal pneumonia 03/03/2017  . Hyponatremia 03/03/2017  . Dehydration 03/03/2017  . Leukocytosis 03/03/2017  . Acute on chronic respiratory failure ([place]) 03/02/2017  . Pneumonia 07/18/2016  . AAA (abdominal aortic aneurysm) without rupture ([place]) 05/17/2016  . PVD (peripheral vascular disease) ([place]) 05/17/2016  . Protein-calorie malnutrition, severe 04/29/2016  . Community acquired pneumonia 04/28/2016  . Chest pain 11/25/2015   1. Acute bronchitis with COPD ([place]) Stop Spiriva, start Stiolto as discused.  Sample given.  Take Azithromycin course as discussed.  Will follow up in 4 weeks. Will give depomedrol at follow up if no better.   2. SOB (shortness of breath) Take Stiolto as prescribed.   3. Benign hypertension Elevated today. Will monitor. m   General Counseling: I have discussed the findings of the evaluation and examination with Onnie.  I have also discussed any further diagnostic evaluation thatmay be needed or ordered today. Cayleen verbalizes understanding of the findings of todays visit. We also reviewed her medications today and discussed drug  interactions and side effects including but not limited excessive drowsiness and altered mental states. We also discussed that  there is always a risk not just to her but also people around her. she has been encouraged to call the office with any questions or concerns that should arise related to todays visit.    Time spent: 25  I have personally obtained a history, examined the patient, evaluated laboratory and imaging results, formulated the assessment and plan and placed orders.    Allyne Gee, MD [facility] Pulmonary and Critical Care Sleep medicine

## 2018-04-01 NOTE — Patient Instructions (Signed)

## 2018-04-05 ENCOUNTER — Other Ambulatory Visit: Payer: Self-pay | Admitting: Internal Medicine

## 2018-04-05 DIAGNOSIS — J449 Chronic obstructive pulmonary disease, unspecified: Secondary | ICD-10-CM

## 2018-05-02 ENCOUNTER — Encounter: Payer: Self-pay | Admitting: Adult Health

## 2018-05-02 ENCOUNTER — Ambulatory Visit (INDEPENDENT_AMBULATORY_CARE_PROVIDER_SITE_OTHER): Payer: Medicare Other | Admitting: Adult Health

## 2018-05-02 VITALS — BP 140/80 | HR 65 | Resp 16 | Ht 60.0 in | Wt 83.2 lb

## 2018-05-02 DIAGNOSIS — J440 Chronic obstructive pulmonary disease with acute lower respiratory infection: Secondary | ICD-10-CM | POA: Diagnosis not present

## 2018-05-02 DIAGNOSIS — J449 Chronic obstructive pulmonary disease, unspecified: Secondary | ICD-10-CM | POA: Diagnosis not present

## 2018-05-02 DIAGNOSIS — J209 Acute bronchitis, unspecified: Secondary | ICD-10-CM

## 2018-05-02 DIAGNOSIS — I10 Essential (primary) hypertension: Secondary | ICD-10-CM | POA: Diagnosis not present

## 2018-05-02 MED ORDER — IPRATROPIUM-ALBUTEROL 0.5-2.5 (3) MG/3ML IN SOLN: 3.0000 mL | RESPIRATORY_TRACT | 3 refills | Status: DC

## 2018-05-02 NOTE — Patient Instructions (Signed)

## 2018-05-02 NOTE — Progress Notes (Signed)
[facility] [place], [place] [address]  Pulmonary Sleep Medicine   Office Visit Note  Patient Name: Christina Hester DOB: 11-Jul-1941 MRN [id number]  Date of Service: 05/02/2018  Complaints/HPI: Pt here for follow up on Acute bronchitis.  She was seen here four weeks ago and we stopped her Spiriva and started [employer].  She was also given a Z-pak and she reports today she is feeling much better.  She reports immediately when she started taking the Stiolto she coughed for two days.  Her family noticed and asked what was wrong.  She states she switched back to Spiriva, and the coughing stopped.  Pt did not take anymore Stiolto, and would like more samples of Spiriva.    ROS  General: (-) fever, (-) chills, (-) night sweats, (-) weakness Skin: (-) rashes, (-) itching,. Eyes: (-) visual changes, (-) redness, (-) itching. Nose and Sinuses: (-) nasal stuffiness or itchiness, (-) postnasal drip, (-) nosebleeds, (-) sinus trouble. Mouth and Throat: (-) sore throat, (-) hoarseness. Neck: (-) swollen glands, (-) enlarged thyroid, (-) neck pain. Respiratory: - cough, (-) bloody sputum, - shortness of breath, - wheezing. Cardiovascular: - ankle swelling, (-) chest pain. Lymphatic: (-) lymph node enlargement. Neurologic: (-) numbness, (-) tingling. Psychiatric: (-) anxiety, (-) depression   Current Medication: Outpatient Encounter Medications as of 05/02/2018  Medication Sig  . amLODipine (NORVASC) 5 MG tablet Take one tab po qhs  . buPROPion (WELLBUTRIN SR) 100 MG 12 hr tablet Take 1 tablet (100 mg total) by mouth daily.  . clopidogrel (PLAVIX) 75 MG tablet Take 1 tablet (75 mg total) by mouth daily.  . clotrimazole (MYCELEX) 10 MG troche Take 4 to 5 times a  Day as needed fot thrush  . fluticasone (FLOVENT HFA) 110 MCG/ACT inhaler Inhale 2 puffs into the lungs 2 (two) times daily.  Marland Kitchen ipratropium-albuterol (DUONEB) 0.5-2.5 (3) MG/3ML SOLN Take 3 mLs by nebulization  every 4 (four) hours.  Marland Kitchen lisinopril (PRINIVIL,ZESTRIL) 5 MG tablet Take 1 tablet (5 mg total) by mouth daily.  Marland Kitchen LORazepam (ATIVAN) 0.5 MG tablet TAKE 1/2 TO 1 TABLET BY MOUTH AT NIGHT  . nitroGLYCERIN (NITROSTAT) 0.4 MG SL tablet Place 1 tablet (0.4 mg total) under the tongue every 5 (five) minutes as needed for chest pain.  Marland Kitchen nystatin (MYCOSTATIN) 100000 UNIT/ML suspension Take 5 mLs (500,000 Units total) by mouth 4 (four) times daily.  . Tiotropium Bromide Monohydrate (SPIRIVA RESPIMAT) 2.5 MCG/ACT AERS Inhale 1 puff into the lungs daily.  . VENTOLIN HFA 108 (90 Base) MCG/ACT inhaler INHALE 2 PUFFS INTO THE LUNGS EVERY 6 HOURS AS NEEDED FOR WHEEZING ORSHORTNESS OF BREATH  . [DISCONTINUED] ipratropium-albuterol (DUONEB) 0.5-2.5 (3) MG/3ML SOLN Take 3 mLs by nebulization every 4 (four) hours.  Marland Kitchen ezetimibe (ZETIA) 10 MG tablet Take 10 mg by mouth every other day.  . [DISCONTINUED] albuterol (PROVENTIL HFA;VENTOLIN HFA) 108 (90 Base) MCG/ACT inhaler Inhale 2 puffs into the lungs every 6 (six) hours as needed for wheezing or shortness of breath.  . [DISCONTINUED] azithromycin (ZITHROMAX) 250 MG tablet Take every Monday, Wednesday, Friday. (Patient not taking: Reported on 03/04/2018)  . [DISCONTINUED] azithromycin (ZITHROMAX) 250 MG tablet Take 2 tabs PO on day one Take 1 tab on days 2-10  . [DISCONTINUED] levofloxacin (LEVAQUIN) 500 MG tablet Take 1 tablet (500 mg total) by mouth daily. (Patient not taking: Reported on 05/02/2018)   No facility-administered encounter medications on file as of 05/02/2018.     Surgical History: Past  Surgical History:  Procedure Laterality Date  . ABDOMINAL HYSTERECTOMY    . BREAST EXCISIONAL BIOPSY Left 80's or 90's   NEG  . LEFT HEART CATH Bilateral 03/05/2017   Procedure: Left Heart Cath with poss PCI;  Surgeon: Wellington Hampshire, MD;  Location: [facility];  Service: Cardiovascular;  Laterality: Bilateral;    Medical History: Past Medical History:   Diagnosis Date  . AAA (abdominal aortic aneurysm) ([place])    a. 05/2016 Abd U/S: 3.02 x 3.02 AAA.  Marland Kitchen CAD (coronary artery disease)    a. 02/2017 NSTEMI/Cath: LM nl, LAD mild dzs, D2 min irregs, LCX mild dzs, OM2 50ost, RCA 95p, 161m, L-->R collats, EF 55-65%.  Marland Kitchen COPD (chronic obstructive pulmonary disease) ([place])   . Diastolic dysfunction    a. 02/2017 Echo: EF 55-60%, gr1 DD, ? inf HK, mild MR.  Marland Kitchen Hyperlipidemia   . Hypertension   . PAD (peripheral artery disease) ([place])    a. 05/2016 ABI's: R 1.22, L 1.11.  . Tobacco abuse     Family History: Family History  Problem Relation Age of Onset  . Breast cancer Mother   . Breast cancer Maternal Aunt   . Breast cancer Maternal Aunt   . Breast cancer Maternal Aunt     Social History: Social History   Socioeconomic History  . Marital status: Divorced    Spouse name: Not on file  . Number of children: Not on file  . Years of education: Not on file  . Highest education level: Not on file  Occupational History  . Not on file  Social Needs  . Financial resource strain: Not on file  . Food insecurity:    Worry: Not on file    Inability: Not on file  . Transportation needs:    Medical: Not on file    Non-medical: Not on file  Tobacco Use  . Smoking status: Former Smoker    Packs/day: 0.25    Last attempt to quit: 07/12/2017    Years since quitting: 0.8  . Smokeless tobacco: Never Used  . Tobacco comment: currently 90 days without smoking  Substance and Sexual Activity  . Alcohol use: No  . Drug use: No  . Sexual activity: Not on file  Lifestyle  . Physical activity:    Days per week: Not on file    Minutes per session: Not on file  . Stress: Not on file  Relationships  . Social connections:    Talks on phone: Not on file    Gets together: Not on file    Attends religious service: Not on file    Active member of club or organization: Not on file    Attends meetings of clubs or organizations: Not on file    Relationship  status: Not on file  . Intimate partner violence:    Fear of current or ex partner: Not on file    Emotionally abused: Not on file    Physically abused: Not on file    Forced sexual activity: Not on file  Other Topics Concern  . Not on file  Social History Narrative  . Not on file    Vital Signs: Blood pressure 140/80, pulse 65, resp. rate 16, height 5' (1.524 m), weight 83 lb 3.2 oz (37.7 kg), SpO2 95 %.  Examination: General Appearance: The patient is well-developed, well-nourished, and in no distress. Skin: Gross inspection of skin unremarkable. Head: normocephalic, no gross deformities. Eyes: no gross deformities noted. ENT: ears  appear grossly normal no exudates. Neck: Supple. No thyromegaly. No LAD. Respiratory: Clear but diminished. Cardiovascular: Normal S1 and S2 without murmur or rub. Extremities: No cyanosis. pulses are equal. Neurologic: Alert and oriented. No involuntary movements.  LABS: No results found for this or any previous visit (from the past 2160 hour(s)).  Radiology: Dg Chest 2 View  Result Date: 03/04/2018 CLINICAL DATA:  Cough.  Acute bronchitis.  COPD.  Former smoker. EXAM: CHEST - 2 VIEW COMPARISON:  07/14/2017 chest radiograph. FINDINGS: Stable cardiomediastinal silhouette with normal heart size. No pneumothorax. No pleural effusion. Hyperinflated lungs. No pulmonary edema. No acute consolidative airspace disease. IMPRESSION: No acute cardiopulmonary disease. Hyperinflated lungs, compatible with the provided history of COPD. Electronically Signed   By: Ilona Sorrel M.D.   On: 03/04/2018 13:51    No results found.  No results found.    Assessment and Plan: Patient Active Problem List   Diagnosis Date Noted  . Acute bronchitis with COPD ([place]) 07/25/2017  . Benign hypertension 07/25/2017  . Carotid stenosis 05/18/2017  . Coronary artery disease 03/06/2017  . S/P cardiac cath 03/06/2017  . Unstable angina ([place]) 03/04/2017  . Demand ischemia  ([place]) 03/04/2017  . COPD with acute exacerbation ([place]) 03/03/2017  . Left sided chest pain 03/03/2017  . Multifocal pneumonia 03/03/2017  . Hyponatremia 03/03/2017  . Dehydration 03/03/2017  . Leukocytosis 03/03/2017  . Acute on chronic respiratory failure ([place]) 03/02/2017  . Pneumonia 07/18/2016  . AAA (abdominal aortic aneurysm) without rupture ([place]) 05/17/2016  . PVD (peripheral vascular disease) ([place]) 05/17/2016  . Protein-calorie malnutrition, severe 04/29/2016  . Community acquired pneumonia 04/28/2016  . Chest pain 11/25/2015   1. Acute bronchitis with COPD ([place]) Gave patient samples of Spiriva Respimat.  Pt is traveling to see son in [place] in a few weeks.  Z-pak RX given to take with her in case she gets sick over during the trip.   She is currently doing well with her breathing she reports.  Encouraged patient to continue to use Spiriva as before.  2. COPD with chronic bronchitis and emphysema ([place]) Patient remains at her baseline at this time.  Denies excessive coughing, hemoptysis, chest pain or excessive shortness of breath. - ipratropium-albuterol (DUONEB) 0.5-2.5 (3) MG/3ML SOLN; Take 3 mLs by nebulization every 4 (four) hours.  Dispense: 360 mL; Refill: 3  3. Benign hypertension Patient's blood pressure well controlled at this time.  Continue current therapy.   General Counseling: I have discussed the findings of the evaluation and examination with Siarah.  I have also discussed any further diagnostic evaluation thatmay be needed or ordered today. Anyelina verbalizes understanding of the findings of todays visit. We also reviewed her medications today and discussed drug interactions and side effects including but not limited excessive drowsiness and altered mental states. We also discussed that there is always a risk not just to her but also people around her. she has been encouraged to call the office with any questions or concerns that should arise related to todays  visit.    Time spent: 25 This patient was seen by Orson Gear AGNP-C in Collaboration with Dr. Devona Konig as a part of collaborative care agreement.   I have personally obtained a history, examined the patient, evaluated laboratory and imaging results, formulated the assessment and plan and placed orders.    Allyne Gee, MD [facility] Pulmonary and Critical Care Sleep medicine

## 2018-05-13 DIAGNOSIS — Z23 Encounter for immunization: Secondary | ICD-10-CM | POA: Diagnosis not present

## 2018-05-19 ENCOUNTER — Other Ambulatory Visit: Payer: Self-pay

## 2018-05-19 ENCOUNTER — Encounter: Payer: Self-pay | Admitting: Emergency Medicine

## 2018-05-19 ENCOUNTER — Emergency Department
Admission: EM | Admit: 2018-05-19 | Discharge: 2018-05-20 | Disposition: A | Discharge status: Home / Self Care | Payer: Medicare Other | Source: Home / Self Care | Attending: Emergency Medicine | Admitting: Emergency Medicine

## 2018-05-19 DIAGNOSIS — J449 Chronic obstructive pulmonary disease, unspecified: Secondary | ICD-10-CM | POA: Insufficient documentation

## 2018-05-19 DIAGNOSIS — Z7902 Long term (current) use of antithrombotics/antiplatelets: Secondary | ICD-10-CM | POA: Insufficient documentation

## 2018-05-19 DIAGNOSIS — R112 Nausea with vomiting, unspecified: Secondary | ICD-10-CM | POA: Insufficient documentation

## 2018-05-19 DIAGNOSIS — Z79899 Other long term (current) drug therapy: Secondary | ICD-10-CM | POA: Insufficient documentation

## 2018-05-19 DIAGNOSIS — Z87891 Personal history of nicotine dependence: Secondary | ICD-10-CM

## 2018-05-19 DIAGNOSIS — I2510 Atherosclerotic heart disease of native coronary artery without angina pectoris: Secondary | ICD-10-CM | POA: Insufficient documentation

## 2018-05-19 DIAGNOSIS — R197 Diarrhea, unspecified: Principal | ICD-10-CM

## 2018-05-19 DIAGNOSIS — I10 Essential (primary) hypertension: Secondary | ICD-10-CM | POA: Insufficient documentation

## 2018-05-19 MED ORDER — SODIUM CHLORIDE 0.9 % IV BOLUS
1000.0000 mL | Freq: Once | INTRAVENOUS | Status: AC
  Administered 2018-05-19: 1000 mL via INTRAVENOUS

## 2018-05-19 MED ORDER — DIPHENHYDRAMINE HCL 50 MG/ML IJ SOLN
12.5000 mg | Freq: Once | INTRAMUSCULAR | Status: AC
  Administered 2018-05-19: 12.5 mg via INTRAVENOUS
  Filled 2018-05-19: qty 1

## 2018-05-19 MED ORDER — LOPERAMIDE HCL 2 MG PO CAPS
4.0000 mg | ORAL_CAPSULE | Freq: Once | ORAL | Status: AC
  Administered 2018-05-19: 4 mg via ORAL
  Filled 2018-05-19: qty 2

## 2018-05-19 MED ORDER — PROCHLORPERAZINE EDISYLATE 10 MG/2ML IJ SOLN
5.0000 mg | Freq: Once | INTRAMUSCULAR | Status: AC
  Administered 2018-05-19: 5 mg via INTRAVENOUS
  Filled 2018-05-19: qty 2

## 2018-05-19 MED ORDER — ONDANSETRON HCL 4 MG/2ML IJ SOLN: 4.0000 mg | Freq: Once | INTRAMUSCULAR | Status: DC | PRN

## 2018-05-19 NOTE — ED Triage Notes (Signed)
Patient presents to Emergency Department via EMS with complaints of nausea , vomiting x 1 and diarrhea x 2 prior to arrival, pt reports s/sx started after dinner.   Pt reports taking Z-pak and  Levaquin 8-10 weeks ago for bronchitis.  Pt got 4 mg Zofran IV from EMS and 279ml NS.  Hx COPD, HTN

## 2018-05-19 NOTE — ED Provider Notes (Signed)
[facility] Emergency Department Provider Note  ____________________________________________   First MD Initiated Contact with Patient 05/19/18 2324     (approximate)  I have reviewed the triage vital signs and the nursing notes.   HISTORY  Chief Complaint Abdominal Pain; Emesis; Nausea; and Diarrhea   HPI Christina Hester is a 77 y.o. female who comes to the emergency department by EMS with nausea vomiting and diarrhea that began about 2 hours prior to arrival.  She is vomited once and had 2 loose stools.  Symptoms began suddenly and acutely just after dinner.  They are now moderate in severity and intermittent.  She is had no chest pain or shortness of breath.   Her last antibiotic use was 8 to 10 weeks ago.  EMS gave the patient 4 mg of IV Zofran at 250 cc of normal saline.  Has not left the country recently.  No recent shellfish.  No one else at home is sick.  Symptoms came on suddenly were moderate severity and the Zofran did seem to improve somewhat although she still has diarrhea and feels cramping diffuse abdominal pain.   Past Medical History:  Diagnosis Date  . AAA (abdominal aortic aneurysm) ([place])    a. 05/2016 Abd U/S: 3.02 x 3.02 AAA.  Marland Kitchen CAD (coronary artery disease)    a. 02/2017 NSTEMI/Cath: LM nl, LAD mild dzs, D2 min irregs, LCX mild dzs, OM2 50ost, RCA 95p, 145m, L-->R collats, EF 55-65%.  . Cancer ([place])   . COPD (chronic obstructive pulmonary disease) ([place])   . Diastolic dysfunction    a. 02/2017 Echo: EF 55-60%, gr1 DD, ? inf HK, mild MR.  Marland Kitchen Hyperlipidemia   . Hypertension   . PAD (peripheral artery disease) ([place])    a. 05/2016 ABI's: R 1.22, L 1.11.  . Tobacco abuse     Patient Active Problem List   Diagnosis Date Noted  . Lower GI bleed 05/21/2018  . Acute bronchitis with COPD ([place]) 07/25/2017  . Benign hypertension 07/25/2017  . Carotid stenosis 05/18/2017  . Coronary artery disease 03/06/2017  . S/P cardiac cath 03/06/2017    . Unstable angina ([place]) 03/04/2017  . Demand ischemia ([place]) 03/04/2017  . COPD with acute exacerbation ([place]) 03/03/2017  . Left sided chest pain 03/03/2017  . Multifocal pneumonia 03/03/2017  . Hyponatremia 03/03/2017  . Dehydration 03/03/2017  . Leukocytosis 03/03/2017  . Acute on chronic respiratory failure ([place]) 03/02/2017  . Pneumonia 07/18/2016  . AAA (abdominal aortic aneurysm) without rupture ([place]) 05/17/2016  . PVD (peripheral vascular disease) ([place]) 05/17/2016  . Protein-calorie malnutrition, severe 04/29/2016  . Community acquired pneumonia 04/28/2016  . Chest pain 11/25/2015    Past Surgical History:  Procedure Laterality Date  . ABDOMINAL HYSTERECTOMY    . APPENDECTOMY    . BREAST EXCISIONAL BIOPSY Left 80's or 90's   NEG  . LEFT HEART CATH Bilateral 03/05/2017   Procedure: Left Heart Cath with poss PCI;  Surgeon: Wellington Hampshire, MD;  Location: [facility];  Service: Cardiovascular;  Laterality: Bilateral;    Prior to Admission medications   Medication Sig Start Date End Date Taking? Authorizing Provider  amLODipine (NORVASC) 5 MG tablet Take one tab po qhs 09/04/17   Lavera Guise, MD  buPROPion [facility] SR) 100 MG 12 hr tablet Take 1 tablet (100 mg total) by mouth daily. 11/19/17   Ronnell Freshwater, NP  clopidogrel (PLAVIX) 75 MG tablet Take 1 tablet (75 mg total) by mouth  daily. 02/25/18   Wellington Hampshire, MD  clotrimazole [facility]) 10 MG troche Take 4 to 5 times a  Day as needed fot thrush 08/20/17   Lavera Guise, MD  ezetimibe (ZETIA) 10 MG tablet Take 10 mg by mouth every other day.    [provider]  fluticasone (FLOVENT HFA) 110 MCG/ACT inhaler Inhale 2 puffs into the lungs 2 (two) times daily. 12/27/17   Lavera Guise, MD  ipratropium-albuterol (DUONEB) 0.5-2.5 (3) MG/3ML SOLN Take 3 mLs by nebulization every 4 (four) hours. 05/02/18   Kendell Bane, NP  lisinopril (PRINIVIL,ZESTRIL) 5 MG tablet Take 1 tablet (5 mg total) by mouth  daily. 10/11/17   Wellington Hampshire, MD  LORazepam (ATIVAN) 0.5 MG tablet TAKE 1/2 TO 1 TABLET BY MOUTH AT NIGHT 03/05/18   Lavera Guise, MD  nitroGLYCERIN (NITROSTAT) 0.4 MG SL tablet Place 1 tablet (0.4 mg total) under the tongue every 5 (five) minutes as needed for chest pain. 03/06/17   Theodoro Grist, MD  nystatin (MYCOSTATIN) 100000 UNIT/ML suspension Take 5 mLs (500,000 Units total) by mouth 4 (four) times daily. 12/27/17   Lavera Guise, MD  ondansetron (ZOFRAN ODT) 4 MG disintegrating tablet Take 1 tablet (4 mg total) by mouth every 8 (eight) hours as needed for nausea or vomiting. 05/20/18   Darel Hong, MD  Tiotropium Bromide Monohydrate (SPIRIVA RESPIMAT) 2.5 MCG/ACT AERS Inhale 1 puff into the lungs daily. 12/27/17   Lavera Guise, MD  VENTOLIN HFA 108 [phone] Base) MCG/ACT inhaler INHALE 2 PUFFS INTO THE LUNGS EVERY 6 HOURS AS NEEDED FOR WHEEZING ORSHORTNESS OF BREATH 04/05/18   Allyne Gee, MD    Allergies Aspirin; Prednisone; Sulfa antibiotics; and Symbicort [budesonide-formoterol fumarate]  Family History  Problem Relation Age of Onset  . Breast cancer Mother   . Dementia Mother   . Hypertension Mother   . Breast cancer Maternal Aunt   . Breast cancer Maternal Aunt   . Breast cancer Maternal Aunt   . CVA Father     Social History Social History   Tobacco Use  . Smoking status: Former Smoker    Packs/day: 0.25    Last attempt to quit: 07/12/2017    Years since quitting: 0.8  . Smokeless tobacco: Never Used  . Tobacco comment: currently 90 days without smoking  Substance Use Topics  . Alcohol use: No  . Drug use: No    Review of Systems Constitutional: No fever/chills Eyes: No visual changes. ENT: No sore throat. Cardiovascular: Denies chest pain. Respiratory: Denies shortness of breath. Gastrointestinal: Positive for abdominal pain.  Positive for nausea, positive for vomiting.  Positive for diarrhea.  No constipation. Genitourinary: Negative for  dysuria. Musculoskeletal: Negative for back pain. Skin: Negative for rash. Neurological: Negative for headaches, focal weakness or numbness.   ____________________________________________   PHYSICAL EXAM:  VITAL SIGNS: ED Triage Vitals  Enc Vitals Group     BP      Pulse      Resp      Temp      Temp src      SpO2      Weight      Height      Head Circumference      Peak Flow      Pain Score      Pain Loc      Pain Edu?      Excl. in [place]?     Constitutional: Alert and oriented x4  retching and uncomfortable appearing Eyes: PERRL EOMI. Head: Atraumatic. Nose: No congestion/rhinnorhea. Mouth/Throat: No trismus Neck: No stridor.   Cardiovascular: Normal rate, regular rhythm. Grossly normal heart sounds.  Good peripheral circulation. Respiratory: Normal respiratory effort.  No retractions. Lungs CTAB and moving good air Gastrointestinal: Soft abdomen diffuse mild tenderness with no focality no rebound or guarding no peritonitis Musculoskeletal: No lower extremity edema   Neurologic:  Normal speech and language. No gross focal neurologic deficits are appreciated. Skin:  Skin is warm, dry and intact. No rash noted. Psychiatric: Mood and affect are normal. Speech and behavior are normal.    ____________________________________________   DIFFERENTIAL includes but not limited to  Appendicitis, diverticulitis, colitis, gastroenteritis, C. difficile, infectious diarrhea, inflammatory diarrhea ____________________________________________   LABS (all labs ordered are listed, but only abnormal results are displayed)  Labs Reviewed  COMPREHENSIVE METABOLIC PANEL - Abnormal; Notable for the following components:      Result Value   Sodium 133 (*)    All other components within normal limits  CBC WITH DIFFERENTIAL/PLATELET - Abnormal; Notable for the following components:   WBC 11.7 (*)    Neutro Abs 8.2 (*)    All other components within normal limits  URINALYSIS,  COMPLETE (UACMP) WITH MICROSCOPIC - Abnormal; Notable for the following components:   Color, Urine STRAW (*)    APPearance CLEAR (*)    Specific Gravity, Urine 1.004 (*)    All other components within normal limits  GASTROINTESTINAL PANEL BY PCR, STOOL (REPLACES STOOL CULTURE)  C DIFFICILE QUICK SCREEN W PCR REFLEX  LIPASE, BLOOD    Lab work reviewed by me shows slightly elevated white count which is nonspecific lab work otherwise unremarkable C. difficile negative Bio fire negative __________________________________________  EKG   ____________________________________________  RADIOLOGY   ____________________________________________   PROCEDURES  Procedure(s) performed: no  Procedures  Critical Care performed: no  ____________________________________________   INITIAL IMPRESSION / ASSESSMENT AND PLAN / ED COURSE  Pertinent labs & imaging results that were available during my care of the patient were reviewed by me and considered in my medical decision making (see chart for details).   As part of my medical decision making, I reviewed the following data within the [place] History obtained from family if available, nursing notes, old chart and ekg, as well as notes from prior ED visits.  The patient comes to the emergency department with roughly 2 hours of acute nausea vomiting diarrhea.  She did use antibiotics 8 or 10 weeks ago so we will add on a C. difficile especially as the patient feels like she cannot have a bowel movement now.  We will give gentle IV fluids 4 mg of loperamide and 8 mg of IV Zofran and reevaluate.  Patient's lab work is reassuring and following loperamide and Zofran her abdominal exam is completely benign and she would like to go home.  I discussed with the patient the diagnostic uncertainty however I do not feel she requires a CT scan at this time as she primarily had nausea vomiting and diarrhea and not abdominal pain.  Her  symptoms have only been going on for several hours though which makes the possibility of appendicitis diverticulitis or other inflammatory disease possible.  I will discharge her home on a short course of Zofran and loperamide and strict return precautions have been given.  The patient verbalizes understanding and agreement the plan.      ____________________________________________   FINAL CLINICAL IMPRESSION(S) / ED DIAGNOSES  Final diagnoses:  Nausea vomiting and diarrhea      NEW MEDICATIONS STARTED DURING THIS VISIT:  Discharge Medication List as of 05/20/2018  2:07 AM    START taking these medications   Details  ondansetron (ZOFRAN ODT) 4 MG disintegrating tablet Take 1 tablet (4 mg total) by mouth every 8 (eight) hours as needed for nausea or vomiting., Starting Mon 05/20/2018, Print         Note:  This document was prepared using Dragon voice recognition software and may include unintentional dictation errors.     Darel Hong, MD 05/21/18 [phone]

## 2018-05-20 LAB — CBC WITH DIFFERENTIAL/PLATELET
ABS IMMATURE GRANULOCYTES: 0.05 10*3/uL (ref 0.00–0.07)
Basophils Absolute: 0.1 10*3/uL (ref 0.0–0.1)
Basophils Relative: 0 %
EOS PCT: 1 %
Eosinophils Absolute: 0.2 10*3/uL (ref 0.0–0.5)
HCT: 38.9 % (ref 36.0–46.0)
Hemoglobin: 13 g/dL (ref 12.0–15.0)
Immature Granulocytes: 0 %
Lymphocytes Relative: 24 %
Lymphs Abs: 2.8 10*3/uL (ref 0.7–4.0)
MCH: 30.8 pg (ref 26.0–34.0)
MCHC: 33.4 g/dL (ref 30.0–36.0)
MCV: 92.2 fL (ref 80.0–100.0)
MONO ABS: 0.4 10*3/uL (ref 0.1–1.0)
MONOS PCT: 4 %
NEUTROS ABS: 8.2 10*3/uL — AB (ref 1.7–7.7)
Neutrophils Relative %: 71 %
PLATELETS: 256 10*3/uL (ref 150–400)
RBC: 4.22 MIL/uL (ref 3.87–5.11)
RDW: 13.1 % (ref 11.5–15.5)
WBC: 11.7 10*3/uL — ABNORMAL HIGH (ref 4.0–10.5)
nRBC: 0 % (ref 0.0–0.2)

## 2018-05-20 LAB — COMPREHENSIVE METABOLIC PANEL
ALK PHOS: 121 U/L (ref 38–126)
ALT: 23 U/L (ref 0–44)
AST: 34 U/L (ref 15–41)
Albumin: 4.1 g/dL (ref 3.5–5.0)
Anion gap: 9 (ref 5–15)
BILIRUBIN TOTAL: 0.6 mg/dL (ref 0.3–1.2)
BUN: 20 mg/dL (ref 8–23)
CALCIUM: 9 mg/dL (ref 8.9–10.3)
CHLORIDE: 99 mmol/L (ref 98–111)
CO2: 25 mmol/L (ref 22–32)
CREATININE: 0.87 mg/dL (ref 0.44–1.00)
GFR calc Af Amer: >60 mL/min (ref >60)
Glucose, Bld: 91 mg/dL (ref 70–99)
Potassium: 4 mmol/L (ref 3.5–5.1)
Sodium: 133 mmol/L — ABNORMAL LOW (ref 135–145)
TOTAL PROTEIN: 7.4 g/dL (ref 6.5–8.1)

## 2018-05-20 LAB — GASTROINTESTINAL PANEL BY PCR, STOOL (REPLACES STOOL CULTURE)

## 2018-05-20 LAB — URINALYSIS, COMPLETE (UACMP) WITH MICROSCOPIC
Bacteria, UA: NONE SEEN
Bilirubin Urine: NEGATIVE
GLUCOSE, UA: NEGATIVE mg/dL
Hgb urine dipstick: NEGATIVE
Ketones, ur: NEGATIVE mg/dL
Leukocytes, UA: NEGATIVE
Nitrite: NEGATIVE
PROTEIN: NEGATIVE mg/dL
SQUAMOUS EPITHELIAL / LPF: NONE SEEN (ref 0–5)
Specific Gravity, Urine: 1.004 — ABNORMAL LOW (ref 1.005–1.030)
pH: 6 (ref 5.0–8.0)

## 2018-05-20 LAB — C DIFFICILE QUICK SCREEN W PCR REFLEX
C Diff antigen: NEGATIVE
C Diff interpretation: NOT DETECTED
C Diff toxin: NEGATIVE

## 2018-05-20 LAB — LIPASE, BLOOD: Lipase: 42 U/L (ref 11–51)

## 2018-05-20 MED ORDER — ONDANSETRON 4 MG PO TBDP: 4.0000 mg | ORAL_TABLET | Freq: Three times a day (TID) | ORAL | 0 refills | Status: DC | PRN

## 2018-05-20 NOTE — ED Notes (Signed)
Maudie Mercury (Daughter) [phone]

## 2018-05-20 NOTE — Discharge Instructions (Signed)
Fortunately today your lab work and your stool tests were very reassuring.  Please take over-the-counter Imodium (loperamide) as needed up to 4 times a day for diarrhea and use your Zofran for nausea.  It is normal for your symptoms to last up to 24 hours but please come back to the emergency department if you cannot eat or drink, if your symptoms last more than 24 hours, if you feel lightheaded or pass out, or for any other concerns whatsoever.  .It was a pleasure to take care of you today, and thank you for coming to our emergency department.  If you have any questions or concerns before leaving please ask the nurse to grab me and I'm more than happy to go through your aftercare instructions again.  If you were prescribed any opioid pain medication today such as Norco, Vicodin, Percocet, morphine, hydrocodone, or oxycodone please make sure you do not drive when you are taking this medication as it can alter your ability to drive safely.  If you have any concerns once you are home that you are not improving or are in fact getting worse before you can make it to your follow-up appointment, please do not hesitate to call 911 and come back for further evaluation.  Darel Hong, MD  Results for orders placed or performed during the hospital encounter of 05/19/18  Gastrointestinal Panel by PCR , Stool  Result Value Ref Range   Campylobacter species NOT DETECTED NOT DETECTED   Plesimonas shigelloides NOT DETECTED NOT DETECTED   Salmonella species NOT DETECTED NOT DETECTED   Yersinia enterocolitica NOT DETECTED NOT DETECTED   Vibrio species NOT DETECTED NOT DETECTED   Vibrio cholerae NOT DETECTED NOT DETECTED   Enteroaggregative E coli (EAEC) NOT DETECTED NOT DETECTED   Enteropathogenic E coli (EPEC) NOT DETECTED NOT DETECTED   Enterotoxigenic E coli (ETEC) NOT DETECTED NOT DETECTED   Shiga like toxin producing E coli (STEC) NOT DETECTED NOT DETECTED   Shigella/Enteroinvasive E coli (EIEC) NOT  DETECTED NOT DETECTED   Cryptosporidium NOT DETECTED NOT DETECTED   Cyclospora cayetanensis NOT DETECTED NOT DETECTED   Entamoeba histolytica NOT DETECTED NOT DETECTED   Giardia lamblia NOT DETECTED NOT DETECTED   Adenovirus F40/41 NOT DETECTED NOT DETECTED   Astrovirus NOT DETECTED NOT DETECTED   Norovirus GI/GII NOT DETECTED NOT DETECTED   Rotavirus A NOT DETECTED NOT DETECTED   Sapovirus (I, II, IV, and V) NOT DETECTED NOT DETECTED  C difficile quick scan w PCR reflex  Result Value Ref Range   C Diff antigen NEGATIVE NEGATIVE   C Diff toxin NEGATIVE NEGATIVE   C Diff interpretation No C. difficile detected.   Comprehensive metabolic panel  Result Value Ref Range   Sodium 133 (L) 135 - 145 mmol/L   Potassium 4.0 3.5 - 5.1 mmol/L   Chloride 99 98 - 111 mmol/L   CO2 25 22 - 32 mmol/L   Glucose, Bld 91 70 - 99 mg/dL   BUN 20 8 - 23 mg/dL   Creatinine, Ser 0.87 0.44 - 1.00 mg/dL   Calcium 9.0 8.9 - 10.3 mg/dL   Total Protein 7.4 6.5 - 8.1 g/dL   Albumin 4.1 3.5 - 5.0 g/dL   AST 34 15 - 41 U/L   ALT 23 0 - 44 U/L   Alkaline Phosphatase 121 38 - 126 U/L   Total Bilirubin 0.6 0.3 - 1.2 mg/dL   GFR calc non Af Amer >60 >60 mL/min   GFR calc Af Amer >  60 >60 mL/min   Anion gap 9 5 - 15  Lipase, blood  Result Value Ref Range   Lipase 42 11 - 51 U/L  CBC with Differential  Result Value Ref Range   WBC 11.7 (H) 4.0 - 10.5 K/uL   RBC 4.22 3.87 - 5.11 MIL/uL   Hemoglobin 13.0 12.0 - 15.0 g/dL   HCT 38.9 36.0 - 46.0 %   MCV 92.2 80.0 - 100.0 fL   MCH 30.8 26.0 - 34.0 pg   MCHC 33.4 30.0 - 36.0 g/dL   RDW 13.1 11.5 - 15.5 %   Platelets 256 150 - 400 K/uL   nRBC 0.0 0.0 - 0.2 %   Neutrophils Relative % 71 %   Neutro Abs 8.2 (H) 1.7 - 7.7 K/uL   Lymphocytes Relative 24 %   Lymphs Abs 2.8 0.7 - 4.0 K/uL   Monocytes Relative 4 %   Monocytes Absolute 0.4 0.1 - 1.0 K/uL   Eosinophils Relative 1 %   Eosinophils Absolute 0.2 0.0 - 0.5 K/uL   Basophils Relative 0 %   Basophils  Absolute 0.1 0.0 - 0.1 K/uL   Immature Granulocytes 0 %   Abs Immature Granulocytes 0.05 0.00 - 0.07 K/uL  Urinalysis, Complete w Microscopic  Result Value Ref Range   Color, Urine STRAW (A) YELLOW   APPearance CLEAR (A) CLEAR   Specific Gravity, Urine 1.004 (L) 1.005 - 1.030   pH 6.0 5.0 - 8.0   Glucose, UA NEGATIVE NEGATIVE mg/dL   Hgb urine dipstick NEGATIVE NEGATIVE   Bilirubin Urine NEGATIVE NEGATIVE   Ketones, ur NEGATIVE NEGATIVE mg/dL   Protein, ur NEGATIVE NEGATIVE mg/dL   Nitrite NEGATIVE NEGATIVE   Leukocytes, UA NEGATIVE NEGATIVE   RBC / HPF 0-5 0 - 5 RBC/hpf   WBC, UA 0-5 0 - 5 WBC/hpf   Bacteria, UA NONE SEEN NONE SEEN   Squamous Epithelial / LPF NONE SEEN 0 - 5

## 2018-05-21 ENCOUNTER — Encounter: Payer: Self-pay | Admitting: Emergency Medicine

## 2018-05-21 ENCOUNTER — Other Ambulatory Visit: Payer: Self-pay

## 2018-05-21 ENCOUNTER — Emergency Department: Payer: Medicare Other

## 2018-05-21 ENCOUNTER — Inpatient Hospital Stay: Payer: Medicare Other

## 2018-05-21 ENCOUNTER — Inpatient Hospital Stay
Admission: EM | Admit: 2018-05-21 | Discharge: 2018-05-23 | DRG: 394 | Disposition: A | Discharge status: Home / Self Care | Payer: Medicare Other | Attending: Internal Medicine | Admitting: Internal Medicine

## 2018-05-21 DIAGNOSIS — Z79899 Other long term (current) drug therapy: Secondary | ICD-10-CM | POA: Diagnosis not present

## 2018-05-21 DIAGNOSIS — J441 Chronic obstructive pulmonary disease with (acute) exacerbation: Secondary | ICD-10-CM | POA: Diagnosis not present

## 2018-05-21 DIAGNOSIS — Z23 Encounter for immunization: Secondary | ICD-10-CM

## 2018-05-21 DIAGNOSIS — I714 Abdominal aortic aneurysm, without rupture: Secondary | ICD-10-CM | POA: Diagnosis present

## 2018-05-21 DIAGNOSIS — I2510 Atherosclerotic heart disease of native coronary artery without angina pectoris: Secondary | ICD-10-CM | POA: Diagnosis present

## 2018-05-21 DIAGNOSIS — Z7902 Long term (current) use of antithrombotics/antiplatelets: Secondary | ICD-10-CM | POA: Diagnosis not present

## 2018-05-21 DIAGNOSIS — J449 Chronic obstructive pulmonary disease, unspecified: Secondary | ICD-10-CM | POA: Diagnosis present

## 2018-05-21 DIAGNOSIS — I252 Old myocardial infarction: Secondary | ICD-10-CM

## 2018-05-21 DIAGNOSIS — K5732 Diverticulitis of large intestine without perforation or abscess without bleeding: Secondary | ICD-10-CM | POA: Diagnosis present

## 2018-05-21 DIAGNOSIS — Z90710 Acquired absence of both cervix and uterus: Secondary | ICD-10-CM | POA: Diagnosis not present

## 2018-05-21 DIAGNOSIS — Z9861 Coronary angioplasty status: Secondary | ICD-10-CM

## 2018-05-21 DIAGNOSIS — K599 Functional intestinal disorder, unspecified: Secondary | ICD-10-CM | POA: Diagnosis not present

## 2018-05-21 DIAGNOSIS — I739 Peripheral vascular disease, unspecified: Secondary | ICD-10-CM | POA: Diagnosis present

## 2018-05-21 DIAGNOSIS — K5730 Diverticulosis of large intestine without perforation or abscess without bleeding: Secondary | ICD-10-CM | POA: Diagnosis not present

## 2018-05-21 DIAGNOSIS — E785 Hyperlipidemia, unspecified: Secondary | ICD-10-CM | POA: Diagnosis not present

## 2018-05-21 DIAGNOSIS — Z7951 Long term (current) use of inhaled steroids: Secondary | ICD-10-CM

## 2018-05-21 DIAGNOSIS — K529 Noninfective gastroenteritis and colitis, unspecified: Secondary | ICD-10-CM | POA: Diagnosis not present

## 2018-05-21 DIAGNOSIS — F419 Anxiety disorder, unspecified: Secondary | ICD-10-CM | POA: Diagnosis present

## 2018-05-21 DIAGNOSIS — K922 Gastrointestinal hemorrhage, unspecified: Secondary | ICD-10-CM

## 2018-05-21 DIAGNOSIS — I10 Essential (primary) hypertension: Secondary | ICD-10-CM | POA: Diagnosis present

## 2018-05-21 DIAGNOSIS — K559 Vascular disorder of intestine, unspecified: Principal | ICD-10-CM | POA: Diagnosis present

## 2018-05-21 DIAGNOSIS — K5790 Diverticulosis of intestine, part unspecified, without perforation or abscess without bleeding: Secondary | ICD-10-CM | POA: Diagnosis not present

## 2018-05-21 DIAGNOSIS — K55039 Acute (reversible) ischemia of large intestine, extent unspecified: Secondary | ICD-10-CM | POA: Diagnosis not present

## 2018-05-21 DIAGNOSIS — N281 Cyst of kidney, acquired: Secondary | ICD-10-CM | POA: Diagnosis not present

## 2018-05-21 DIAGNOSIS — K921 Melena: Secondary | ICD-10-CM | POA: Diagnosis not present

## 2018-05-21 DIAGNOSIS — Z0389 Encounter for observation for other suspected diseases and conditions ruled out: Secondary | ICD-10-CM | POA: Diagnosis not present

## 2018-05-21 HISTORY — DX: Malignant (primary) neoplasm, unspecified: C80.1

## 2018-05-21 LAB — CBC
HCT: 36.5 % (ref 36.0–46.0)
Hemoglobin: 12.3 g/dL (ref 12.0–15.0)
MCH: 31.1 pg (ref 26.0–34.0)
MCHC: 33.7 g/dL (ref 30.0–36.0)
MCV: 92.2 fL (ref 80.0–100.0)
Platelets: 239 10*3/uL (ref 150–400)
RBC: 3.96 MIL/uL (ref 3.87–5.11)
RDW: 13.2 % (ref 11.5–15.5)
WBC: 15.7 10*3/uL — AB (ref 4.0–10.5)
nRBC: 0 % (ref 0.0–0.2)

## 2018-05-21 LAB — TYPE AND SCREEN
ABO/RH(D): A POS
Antibody Screen: NEGATIVE

## 2018-05-21 LAB — COMPREHENSIVE METABOLIC PANEL
ALBUMIN: 3.9 g/dL (ref 3.5–5.0)
ALT: 19 U/L (ref 0–44)
ANION GAP: 9 (ref 5–15)
AST: 23 U/L (ref 15–41)
Alkaline Phosphatase: 86 U/L (ref 38–126)
BILIRUBIN TOTAL: 0.8 mg/dL (ref 0.3–1.2)
BUN: 7 mg/dL — ABNORMAL LOW (ref 8–23)
CO2: 28 mmol/L (ref 22–32)
Calcium: 9.1 mg/dL (ref 8.9–10.3)
Chloride: 98 mmol/L (ref 98–111)
Creatinine, Ser: 0.74 mg/dL (ref 0.44–1.00)
GFR calc non Af Amer: >60 mL/min (ref >60)
Glucose, Bld: 110 mg/dL — ABNORMAL HIGH (ref 70–99)
POTASSIUM: 3.8 mmol/L (ref 3.5–5.1)
SODIUM: 135 mmol/L (ref 135–145)
TOTAL PROTEIN: 7.2 g/dL (ref 6.5–8.1)

## 2018-05-21 LAB — APTT: APTT: 32 s (ref 24–36)

## 2018-05-21 LAB — PROTIME-INR
INR: 1.03
PROTHROMBIN TIME: 13.4 s (ref 11.4–15.2)

## 2018-05-21 LAB — HEMOGLOBIN: Hemoglobin: 12.8 g/dL (ref 12.0–15.0)

## 2018-05-21 MED ORDER — LEVOFLOXACIN 500 MG PO TABS: 250.0000 mg | ORAL_TABLET | Freq: Every day | ORAL | Status: DC

## 2018-05-21 MED ORDER — TIOTROPIUM BROMIDE MONOHYDRATE 18 MCG IN CAPS
1.0000 | ORAL_CAPSULE | Freq: Every day | RESPIRATORY_TRACT | Status: DC
  Administered 2018-05-22 – 2018-05-23 (×2): 18 ug via RESPIRATORY_TRACT
  Filled 2018-05-21: qty 5

## 2018-05-21 MED ORDER — NITROGLYCERIN 0.4 MG SL SUBL: 0.4000 mg | SUBLINGUAL_TABLET | SUBLINGUAL | Status: DC | PRN

## 2018-05-21 MED ORDER — ONDANSETRON HCL 4 MG PO TABS: 4.0000 mg | ORAL_TABLET | Freq: Four times a day (QID) | ORAL | Status: DC | PRN

## 2018-05-21 MED ORDER — IOPAMIDOL (ISOVUE-300) INJECTION 61%
15.0000 mL | INTRAVENOUS | Status: AC
  Administered 2018-05-21: 15 mL via ORAL

## 2018-05-21 MED ORDER — TECHNETIUM TC 99M-LABELED RED BLOOD CELLS IV KIT
23.8100 | PACK | Freq: Once | INTRAVENOUS | Status: AC | PRN
  Administered 2018-05-21: 23.81 via INTRAVENOUS

## 2018-05-21 MED ORDER — ACETAMINOPHEN 325 MG PO TABS: 650.0000 mg | ORAL_TABLET | Freq: Four times a day (QID) | ORAL | Status: DC | PRN

## 2018-05-21 MED ORDER — LORAZEPAM 0.5 MG PO TABS
0.5000 mg | ORAL_TABLET | Freq: Every day | ORAL | Status: DC
  Administered 2018-05-21 – 2018-05-22 (×2): 0.5 mg via ORAL
  Filled 2018-05-21 (×2): qty 1

## 2018-05-21 MED ORDER — AMLODIPINE BESYLATE 5 MG PO TABS
5.0000 mg | ORAL_TABLET | Freq: Every day | ORAL | Status: DC
  Administered 2018-05-21 – 2018-05-22 (×2): 5 mg via ORAL
  Filled 2018-05-21 (×2): qty 1

## 2018-05-21 MED ORDER — PEG 3350-KCL-NA BICARB-NACL 420 G PO SOLR
4000.0000 mL | Freq: Once | ORAL | Status: AC
  Administered 2018-05-21: 4000 mL via ORAL
  Filled 2018-05-21: qty 4000

## 2018-05-21 MED ORDER — PNEUMOCOCCAL VAC POLYVALENT 25 MCG/0.5ML IJ INJ
0.5000 mL | INJECTION | INTRAMUSCULAR | Status: AC
  Administered 2018-05-23: 0.5 mL via INTRAMUSCULAR
  Filled 2018-05-21: qty 0.5

## 2018-05-21 MED ORDER — ACETAMINOPHEN 650 MG RE SUPP: 650.0000 mg | Freq: Four times a day (QID) | RECTAL | Status: DC | PRN

## 2018-05-21 MED ORDER — IPRATROPIUM-ALBUTEROL 0.5-2.5 (3) MG/3ML IN SOLN
3.0000 mL | Freq: Four times a day (QID) | RESPIRATORY_TRACT | Status: DC | PRN
  Administered 2018-05-21: 3 mL via RESPIRATORY_TRACT
  Filled 2018-05-21: qty 3

## 2018-05-21 MED ORDER — ONDANSETRON HCL 4 MG/2ML IJ SOLN
4.0000 mg | Freq: Four times a day (QID) | INTRAMUSCULAR | Status: DC | PRN
  Administered 2018-05-21: 4 mg via INTRAVENOUS
  Filled 2018-05-21: qty 2

## 2018-05-21 MED ORDER — SODIUM CHLORIDE 0.9 % IV SOLN
1000.0000 mL | Freq: Once | INTRAVENOUS | Status: AC
  Administered 2018-05-21: 1000 mL via INTRAVENOUS

## 2018-05-21 MED ORDER — SODIUM CHLORIDE 0.9 % IV SOLN
INTRAVENOUS | Status: DC
  Administered 2018-05-21 – 2018-05-22 (×2): via INTRAVENOUS

## 2018-05-21 MED ORDER — FLUTICASONE PROPIONATE HFA 110 MCG/ACT IN AERO: 2.0000 | INHALATION_SPRAY | Freq: Two times a day (BID) | RESPIRATORY_TRACT | Status: DC

## 2018-05-21 MED ORDER — BUPROPION HCL ER (SR) 100 MG PO TB12
100.0000 mg | ORAL_TABLET | Freq: Every day | ORAL | Status: DC
  Administered 2018-05-21 – 2018-05-23 (×3): 100 mg via ORAL
  Filled 2018-05-21 (×3): qty 1

## 2018-05-21 MED ORDER — EZETIMIBE 10 MG PO TABS
10.0000 mg | ORAL_TABLET | ORAL | Status: DC
  Filled 2018-05-21: qty 1

## 2018-05-21 MED ORDER — IOHEXOL 300 MG/ML  SOLN
80.0000 mL | Freq: Once | INTRAMUSCULAR | Status: AC | PRN
  Administered 2018-05-21: 80 mL via INTRAVENOUS

## 2018-05-21 MED ORDER — LISINOPRIL 5 MG PO TABS
5.0000 mg | ORAL_TABLET | Freq: Every day | ORAL | Status: DC
  Administered 2018-05-22 – 2018-05-23 (×2): 5 mg via ORAL
  Filled 2018-05-21 (×2): qty 1

## 2018-05-21 MED ORDER — ONDANSETRON 4 MG PO TBDP
4.0000 mg | ORAL_TABLET | Freq: Three times a day (TID) | ORAL | Status: DC | PRN
  Filled 2018-05-21: qty 1

## 2018-05-21 MED ORDER — LEVOFLOXACIN 500 MG PO TABS
500.0000 mg | ORAL_TABLET | Freq: Once | ORAL | Status: AC
  Administered 2018-05-21: 500 mg via ORAL
  Filled 2018-05-21: qty 1

## 2018-05-21 MED ORDER — BUDESONIDE 0.25 MG/2ML IN SUSP
0.2500 mg | Freq: Two times a day (BID) | RESPIRATORY_TRACT | Status: DC
  Administered 2018-05-21 – 2018-05-23 (×4): 0.25 mg via RESPIRATORY_TRACT
  Filled 2018-05-21 (×7): qty 2

## 2018-05-21 NOTE — Progress Notes (Signed)
PHARMACY NOTE:  ANTIMICROBIAL RENAL DOSAGE ADJUSTMENT  Current antimicrobial regimen includes a mismatch between antimicrobial dosage and estimated renal function.  As per policy approved by the Pharmacy & Therapeutics and Medical Executive Committees, the antimicrobial dosage will be adjusted accordingly.  Current antimicrobial dosage:  Levaquin 500 mg PO daily   Indication:   Renal Function:  Estimated Creatinine Clearance: 35.9 mL/min (by C-G formula based on SCr of 0.74 mg/dL). []      On intermittent HD, scheduled: []      On CRRT    Antimicrobial dosage has been changed to:  Levaquin 500 mg PO X 1 followed by levaquin 250 mg PO daily to start on 11/6 .   Additional comments:   Thank you for allowing pharmacy to be a part of this patient's care.  Richrd Kuzniar D, [facility] 05/21/2018 4:29 PM

## 2018-05-21 NOTE — ED Notes (Signed)
Patient transported to [facility] 

## 2018-05-21 NOTE — H&P (Signed)
[facility] NAME: Christina Hester    MR#:  [id number]  DATE OF BIRTH:  1941-07-14  DATE OF ADMISSION:  05/21/2018  PRIMARY CARE PHYSICIAN: Lavera Guise, MD   REQUESTING/REFERRING PHYSICIAN: Dr Lavonia Drafts  CHIEF COMPLAINT:   Chief Complaint  Patient presents with  . GI Bleeding    HISTORY OF PRESENT ILLNESS:  Christina Hester  is a 77 y.o. female with a known history of recent visits to the ER for nausea vomiting and diarrhea.  She was diagnosed with a virus and sent home.  Her stool studies at that time were negative.  The nausea vomiting had settled down but she has not eaten very much since then.  She is also having bowel movements of bright red blood per rectum.  4 episodes yesterday and 3 today.  Last being at 12:15 PM.  She states the bleeding is quite a bit.  She complains of some abdominal pain described as a gas pain.  Worse when she has to have a bowel movement.  She is been holding her Plavix for the last 2 days her last dose was on Saturday p.m.  In the ER, she was had a CT scan of the abdomen which showed colitis.  Hospitalist services were contacted for further evaluation.  PAST MEDICAL HISTORY:   Past Medical History:  Diagnosis Date  . AAA (abdominal aortic aneurysm) ([place])    a. 05/2016 Abd U/S: 3.02 x 3.02 AAA.  Marland Kitchen CAD (coronary artery disease)    a. 02/2017 NSTEMI/Cath: LM nl, LAD mild dzs, D2 min irregs, LCX mild dzs, OM2 50ost, RCA 95p, 141m, L-->R collats, EF 55-65%.  . Cancer ([place])   . COPD (chronic obstructive pulmonary disease) ([place])   . Diastolic dysfunction    a. 02/2017 Echo: EF 55-60%, gr1 DD, ? inf HK, mild MR.  Marland Kitchen Hyperlipidemia   . Hypertension   . PAD (peripheral artery disease) ([place])    a. 05/2016 ABI's: R 1.22, L 1.11.  . Tobacco abuse     PAST SURGICAL HISTORY:   Past Surgical History:  Procedure Laterality Date  . ABDOMINAL HYSTERECTOMY    . APPENDECTOMY    . BREAST EXCISIONAL  BIOPSY Left 80's or 90's   NEG  . LEFT HEART CATH Bilateral 03/05/2017   Procedure: Left Heart Cath with poss PCI;  Surgeon: Wellington Hampshire, MD;  Location: [facility];  Service: Cardiovascular;  Laterality: Bilateral;    SOCIAL HISTORY:   Social History   Tobacco Use  . Smoking status: Former Smoker    Packs/day: 0.25    Last attempt to quit: 07/12/2017    Years since quitting: 0.8  . Smokeless tobacco: Never Used  . Tobacco comment: currently 90 days without smoking  Substance Use Topics  . Alcohol use: No    FAMILY HISTORY:   Family History  Problem Relation Age of Onset  . Breast cancer Mother   . Dementia Mother   . Hypertension Mother   . Breast cancer Maternal Aunt   . Breast cancer Maternal Aunt   . Breast cancer Maternal Aunt   . CVA Father     DRUG ALLERGIES:   Allergies  Allergen Reactions  . Aspirin Tinitus  . Prednisone Other (See Comments)    Makes violent  . Sulfa Antibiotics Nausea And Vomiting  . Symbicort [Budesonide-Formoterol Fumarate] Other (See Comments)    Patient felt "out of her mind" and angry.  REVIEW OF SYSTEMS:  CONSTITUTIONAL: No fever.  Positive for sweats.  No fatigue or weakness.  EYES: No blurred or double vision.  Wears glasses. EARS, NOSE, AND THROAT: No tinnitus or ear pain. No sore throat RESPIRATORY: Occasional cough.  Occasional shortness of breath.  Occasional wheezing.  No hemoptysis.  CARDIOVASCULAR: No chest pain, orthopnea, edema.  GASTROINTESTINAL: Positive for nausea, vomiting, diarrhea and abdominal pain.  Positive for blood in bowel movements GENITOURINARY: No dysuria, hematuria.  ENDOCRINE: No polyuria, nocturia,  HEMATOLOGY: No anemia, easy bruising or bleeding SKIN: No rash or lesion. MUSCULOSKELETAL: No joint pain or arthritis.   NEUROLOGIC: No tingling, numbness, weakness.  PSYCHIATRY: No anxiety or depression.   MEDICATIONS AT HOME:   Prior to Admission medications   Medication Sig  Start Date End Date Taking? Authorizing Provider  amLODipine (NORVASC) 5 MG tablet Take one tab po qhs 09/04/17  Yes Lavera Guise, MD  buPROPion [facility] SR) 100 MG 12 hr tablet Take 1 tablet (100 mg total) by mouth daily. 11/19/17  Yes Ronnell Freshwater, NP  clopidogrel (PLAVIX) 75 MG tablet Take 1 tablet (75 mg total) by mouth daily. 02/25/18  Yes Wellington Hampshire, MD  clotrimazole (MYCELEX) 10 MG troche Take 4 to 5 times a  Day as needed fot thrush 08/20/17  Yes Lavera Guise, MD  fluticasone (FLOVENT HFA) 110 MCG/ACT inhaler Inhale 2 puffs into the lungs 2 (two) times daily. 12/27/17  Yes Lavera Guise, MD  ipratropium-albuterol (DUONEB) 0.5-2.5 (3) MG/3ML SOLN Take 3 mLs by nebulization every 4 (four) hours. 05/02/18  Yes Scar[place], Audie Clear, NP  lisinopril (PRINIVIL,ZESTRIL) 5 MG tablet Take 1 tablet (5 mg total) by mouth daily. 10/11/17  Yes Wellington Hampshire, MD  LORazepam (ATIVAN) 0.5 MG tablet TAKE 1/2 TO 1 TABLET BY MOUTH AT NIGHT 03/05/18  Yes Lavera Guise, MD  nitroGLYCERIN (NITROSTAT) 0.4 MG SL tablet Place 1 tablet (0.4 mg total) under the tongue every 5 (five) minutes as needed for chest pain. 03/06/17  Yes Theodoro Grist, MD  nystatin (MYCOSTATIN) 100000 UNIT/ML suspension Take 5 mLs (500,000 Units total) by mouth 4 (four) times daily. 12/27/17  Yes Lavera Guise, MD  ondansetron (ZOFRAN ODT) 4 MG disintegrating tablet Take 1 tablet (4 mg total) by mouth every 8 (eight) hours as needed for nausea or vomiting. 05/20/18  Yes Darel Hong, MD  Tiotropium Bromide Monohydrate (SPIRIVA RESPIMAT) 2.5 MCG/ACT AERS Inhale 1 puff into the lungs daily. 12/27/17  Yes Lavera Guise, MD  VENTOLIN HFA 108 [phone] Base) MCG/ACT inhaler INHALE 2 PUFFS INTO THE LUNGS EVERY 6 HOURS AS NEEDED FOR WHEEZING ORSHORTNESS OF BREATH 04/05/18  Yes Allyne Gee, MD  ezetimibe (ZETIA) 10 MG tablet Take 10 mg by mouth every other day.    [provider]      VITAL SIGNS:  Blood pressure (!) 143/72, pulse (!)  105, temperature 97.9 F (36.6 C), temperature source Oral, resp. rate (!) 22, height 5' (1.524 m), weight 38 kg, SpO2 95 %.  PHYSICAL EXAMINATION:  GENERAL:  77 y.o.-year-old patient lying in the bed with no acute distress.  EYES: Pupils equal, round, reactive to light and accommodation. No scleral icterus. Extraocular muscles intact.  HEENT: Head atraumatic, normocephalic. Oropharynx and nasopharynx clear.  NECK:  Supple, no jugular venous distention. No thyroid enlargement, no tenderness.  LUNGS: Normal breath sounds bilaterally, no wheezing, rales,rhonchi or crepitation. No use of accessory muscles of respiration.  CARDIOVASCULAR: S1, S2 normal. No  murmurs, rubs, or gallops.  ABDOMEN: Soft, positive for tenderness in the lower abdomen, nondistended. Bowel sounds present. No organomegaly or mass.  EXTREMITIES: No pedal edema, cyanosis, or clubbing.  NEUROLOGIC: Cranial nerves II through XII are intact. Muscle strength 5/5 in all extremities. Sensation intact. Gait not checked.  PSYCHIATRIC: The patient is alert and oriented x 3.  SKIN: No rash, lesion, or ulcer.   LABORATORY PANEL:   CBC Recent Labs  Lab 05/21/18 0823  WBC 15.7*  HGB 12.3  HCT 36.5  PLT 239   ------------------------------------------------------------------------------------------------------------------  Chemistries  Recent Labs  Lab 05/21/18 0823  NA 135  K 3.8  CL 98  CO2 28  GLUCOSE 110*  BUN 7*  CREATININE 0.74  CALCIUM 9.1  AST 23  ALT 19  ALKPHOS 86  BILITOT 0.8   ------------------------------------------------------------------------------------------------------------------   RADIOLOGY:  Ct Abdomen Pelvis W Contrast  Result Date: 05/21/2018 CLINICAL DATA:  Abdominal pain. Diverticulitis suspected. Bright red blood after passing gas. EXAM: CT ABDOMEN AND PELVIS WITH CONTRAST TECHNIQUE: Multidetector CT imaging of the abdomen and pelvis was performed using the standard protocol  following bolus administration of intravenous contrast. CONTRAST:  69mL OMNIPAQUE IOHEXOL 300 MG/ML  SOLN COMPARISON:  CT abdomen and pelvis 04/28/2014 FINDINGS: Lower chest: The lung bases are clear without focal nodule, mass, or airspace disease. The heart size is normal. Hepatobiliary: A 7 mm cyst of the left lobe of the liver is stable. No other intrahepatic lesions are present. There is no intra or extrahepatic biliary dilation. Gallbladder is normal. Pancreas: Mild pancreatic duct dilation is similar the prior study. No obstructing mass lesion is present. The pancreas is otherwise unremarkable. Spleen: Normal in size without focal abnormality. Adrenals/Urinary Tract: The adrenal glands are normal bilaterally. Multiple bilateral renal cysts are again noted. No solid mass lesion is present. The right ureter is dilated without an obstructing lesion. Nephrograms are symmetric. The urinary bladder is moderately dilated there is no mass lesion or outlet obstruction. Stomach/Bowel: The stomach and duodenum are within normal limits. The small bowel is unremarkable. The terminal ileum is within normal limits. The ascending and transverse colon are within normal limits. There is loss of haustral markings and diffuse wall thickening beginning in the distal transverse colon and at the splenic flexure. Adjacent inflammatory changes are present. Wall thickening continues into the descending and proximal sigmoid colon. The more distal sigmoid colon is more normal in appearance. Vascular/Lymphatic: Extensive atherosclerotic calcifications are present in the aorta and branch vessels without aneurysm. Reproductive: Status post hysterectomy. No adnexal masses. Other: A small amount of free fluid accumulates within the anatomic pelvis. Musculoskeletal: Vertebral body heights and alignment are maintained. No focal lytic or blastic lesions are present. The bony pelvis is intact. The hips are within normal limits bilaterally. Both  hips are located. IMPRESSION: 1. Diffuse wall thickening throughout the distal transverse and descending colon compatible with a nonspecific colitis. Findings continue into the proximal sigmoid colon. 2. No perforation or abscess. 3. Free fluid within the anatomic pelvis is likely reactive. 4. Bilateral renal cysts. 5. Stable 7 mm hypodense lesion in the left lobe of the liver. Electronically Signed   By: San Morelle M.D.   On: 05/21/2018 11:24    EKG:   Normal sinus rhythm 83 bpm, right axis deviation  IMPRESSION AND PLAN:   1.  Lower GI bleed with colitis seen on CT scan.  Stool studies were negative 2 days ago so I will not repeat them.  Empiric Levaquin.  Will get a nuclear medicine bleeding scan.  Serial hemoglobins.  Patient is never had a colonoscopy.  GI consultation.  Could be bleeding from colitis versus ischemic colitis versus diverticular bleed.  Continue to hold Plavix.  Liquid diet for now. 2.  Essential hypertension continue antihypertensives 3.  COPD.  Respiratory status stable 4.  Anxiety on Wellbutrin and Ativan 5.  History of CAD.  Hold Plavix at this time.  All the records are reviewed and case discussed with ED provider. Management plans discussed with the patient, and she is in agreement.  CODE STATUS: full code  TOTAL TIME TAKING CARE OF THIS PATIENT: 50 minutes, including acp time.    Loletha Grayer M.D on 05/21/2018 at 12:52 PM  Between 7am to 6pm - Pager - [phone]  After 6pm call admission pager [phone]  [facility]  [phone]  CC: Primary care physician; Lavera Guise, MD

## 2018-05-21 NOTE — Consult Note (Signed)
Christina Darby, MD [address]  [place]  [place], [place] [address]  Main: [phone]  Fax: [phone] Pager: [phone]   Consultation  Referring Provider:     No ref. provider found Primary Care Physician:  Christina Guise, MD Primary Gastroenterologist: None       Reason for Consultation:     Colitis, hematochezia  Date of Admission:  05/21/2018 Date of Consultation:  05/21/2018         HPI:   Christina Hester is a 77 y.o. female with h/o tobacco use, AAA, CAD s/p PCI on plavix came to er today due to multiple episodes of rectal bleeding with clots and lower abdominal pain. Initially, pain started Sunday then rectal bleeding since yesterday morning. She was in ER 2 days ago due to nausea, vomiting and non bloody diarrhea, C Diff and GI pathogen panel was negative. Hb in ER was 12.3, CT showed left colon wall thickening. Her last dose of plavix was Saturday night.   NSAIDs: none  Antiplts/Anticoagulants/Anti thrombotics: plavix for CAD  GI Procedures: none  Past Medical History:  Diagnosis Date  . AAA (abdominal aortic aneurysm) ([place])    a. 05/2016 Abd U/S: 3.02 x 3.02 AAA.  Marland Kitchen CAD (coronary artery disease)    a. 02/2017 NSTEMI/Cath: LM nl, LAD mild dzs, D2 min irregs, LCX mild dzs, OM2 50ost, RCA 95p, 121m L-->R collats, EF 55-65%.  . Cancer ([facility]   . COPD (chronic obstructive pulmonary disease) ([facility]   . Diastolic dysfunction    a. 02/2017 Echo: EF 55-60%, gr1 DD, ? inf HK, mild MR.  .Marland KitchenHyperlipidemia   . Hypertension   . PAD (peripheral artery disease) ([facility]    a. 05/2016 ABI's: R 1.22, L 1.11.  . Tobacco abuse     Past Surgical History:  Procedure Laterality Date  . ABDOMINAL HYSTERECTOMY    . APPENDECTOMY    . BREAST EXCISIONAL BIOPSY Left 80's or 90's   NEG  . LEFT HEART CATH Bilateral 03/05/2017   Procedure: Left Heart Cath with poss PCI;  Surgeon: AWellington Hampshire MD;  [facility];  Service: Cardiovascular;  Laterality:  Bilateral;    Prior to Admission medications   Medication Sig Start Date End Date Taking? Authorizing Provider  amLODipine (NORVASC) 5 MG tablet Take one tab po qhs 09/04/17  Yes KLavera Guise MD  buPROPion ([facility]) 100 MG 12 hr tablet Take 1 tablet (100 mg total) by mouth daily. 11/19/17  Yes BRonnell Freshwater NP  clopidogrel (PLAVIX) 75 MG tablet Take 1 tablet (75 mg total) by mouth daily. 02/25/18  Yes AWellington Hampshire MD  clotrimazole (MYCELEX) 10 MG troche Take 4 to 5 times a  Day as needed fot thrush 08/20/17  Yes KLavera Guise MD  fluticasone (FLOVENT HFA) 110 MCG/ACT inhaler Inhale 2 puffs into the lungs 2 (two) times daily. 12/27/17  Yes KLavera Guise MD  ipratropium-albuterol (DUONEB) 0.5-2.5 (3) MG/3ML SOLN Take 3 mLs by nebulization every 4 (four) hours. 05/02/18  Yes Scarboro, AAudie Clear NP  lisinopril (PRINIVIL,ZESTRIL) 5 MG tablet Take 1 tablet (5 mg total) by mouth daily. 10/11/17  Yes AWellington Hampshire MD  LORazepam (ATIVAN) 0.5 MG tablet TAKE 1/2 TO 1 TABLET BY MOUTH AT NIGHT 03/05/18  Yes KLavera Guise MD  nitroGLYCERIN (NITROSTAT) 0.4 MG SL tablet Place 1 tablet (0.4 mg total) under the tongue every 5 (five) minutes as needed for chest  pain. 03/06/17  Yes Theodoro Grist, MD  nystatin (MYCOSTATIN) 100000 UNIT/ML suspension Take 5 mLs (500,000 Units total) by mouth 4 (four) times daily. 12/27/17  Yes Christina Guise, MD  ondansetron (ZOFRAN ODT) 4 MG disintegrating tablet Take 1 tablet (4 mg total) by mouth every 8 (eight) hours as needed for nausea or vomiting. 05/20/18  Yes Darel Hong, MD  Tiotropium Bromide Monohydrate (SPIRIVA RESPIMAT) 2.5 MCG/ACT AERS Inhale 1 puff into the lungs daily. 12/27/17  Yes Christina Guise, MD  VENTOLIN HFA 108 [phone] Base) MCG/ACT inhaler INHALE 2 PUFFS INTO THE LUNGS EVERY 6 HOURS AS NEEDED FOR WHEEZING ORSHORTNESS OF BREATH 04/05/18  Yes Allyne Gee, MD  ezetimibe (ZETIA) 10 MG tablet Take 10 mg by mouth every other day.    [provider]    Current Facility-Administered Medications:  .  0.9 %  sodium chloride infusion, , Intravenous, Continuous, Wieting, Richard, MD .  acetaminophen (TYLENOL) tablet 650 mg, 650 mg, Oral, Q6H PRN **OR** acetaminophen (TYLENOL) suppository 650 mg, 650 mg, Rectal, Q6H PRN, Wieting, Richard, MD .  amLODipine (NORVASC) tablet 5 mg, 5 mg, Oral, QHS, Wieting, Richard, MD .  budesonide (PULMICORT) nebulizer solution 0.25 mg, 0.25 mg, Nebulization, BID, Wieting, Richard, MD .  buPROPion [facility]) 12 hr tablet 100 mg, 100 mg, Oral, Daily, Wieting, Richard, MD .  ezetimibe (ZETIA) tablet 10 mg, 10 mg, Oral, QODAY, Wieting, Richard, MD .  ipratropium-albuterol (DUONEB) 0.5-2.5 (3) MG/3ML nebulizer solution 3 mL, 3 mL, Nebulization, Q6H PRN, Loletha Grayer, MD, 3 mL at 05/21/18 1644 .  [START ON 05/22/2018] levofloxacin (LEVAQUIN) tablet 250 mg, 250 mg, Oral, Daily, Wieting, Richard, MD .  levofloxacin [facility]) tablet 500 mg, 500 mg, Oral, Once, Loletha Grayer, MD .  Derrill Memo ON 05/22/2018] lisinopril (PRINIVIL,ZESTRIL) tablet 5 mg, 5 mg, Oral, Daily, Wieting, Richard, MD .  LORazepam (ATIVAN) tablet 0.5 mg, 0.5 mg, Oral, QHS, Wieting, Richard, MD .  nitroGLYCERIN (NITROSTAT) SL tablet 0.4 mg, 0.4 mg, Sublingual, Q5 min PRN, Leslye Peer, Richard, MD .  [DISCONTINUED] ondansetron (ZOFRAN) tablet 4 mg, 4 mg, Oral, Q6H PRN **OR** ondansetron (ZOFRAN) injection 4 mg, 4 mg, Intravenous, Q6H PRN, Wieting, Richard, MD .  ondansetron (ZOFRAN-ODT) disintegrating tablet 4 mg, 4 mg, Oral, Q8H PRN, Leslye Peer, Richard, MD .  Derrill Memo ON 05/23/2018] pneumococcal 23 valent vaccine (PNU-IMMUNE) injection 0.5 mL, 0.5 mL, Intramuscular, Tomorrow-1000, Wieting, Richard, MD .  polyethylene glycol-electrolytes (NuLYTELY/GoLYTELY) solution 4,000 mL, 4,000 mL, Oral, Once, Caileigh Canche, Tally Due, MD .  tiotropium (SPIRIVA) inhalation capsule ([facility] use ONLY) 18 mcg, 1 capsule, Inhalation, Daily, Loletha Grayer, MD  Family History    Problem Relation Age of Onset  . Breast cancer Mother   . Dementia Mother   . Hypertension Mother   . Breast cancer Maternal Aunt   . Breast cancer Maternal Aunt   . Breast cancer Maternal Aunt   . CVA Father      Social History   Tobacco Use  . Smoking status: Former Smoker    Packs/day: 0.25    Last attempt to quit: 07/12/2017    Years since quitting: 0.8  . Smokeless tobacco: Never Used  . Tobacco comment: currently 90 days without smoking  Substance Use Topics  . Alcohol use: No  . Drug use: No    Allergies as of 05/21/2018 - Review Complete 05/21/2018  Allergen Reaction Noted  . Aspirin Tinitus 11/25/2015  . Prednisone Other (See Comments) 03/02/2017  . Sulfa antibiotics Nausea And Vomiting 11/25/2015  .  Symbicort [budesonide-formoterol fumarate] Other (See Comments) 05/09/2016    Review of Systems:    All systems reviewed and negative except where noted in HPI.   Physical Exam:  Vital signs in last 24 hours: Temp:  [97.9 F (36.6 C)-98.2 F (36.8 C)] 98.2 F (36.8 C) (11/05 1630) Pulse Rate:  [81-107] 107 (11/05 1630) Resp:  [18-32] 26 (11/05 1630) BP: (143-153)/(61-82) 153/82 (11/05 1630) SpO2:  [94 %-97 %] 96 % (11/05 1644) Weight:  [38 kg] 38 kg (11/05 0807) Last BM Date: 05/19/18 General:   Pleasant, thin built, cooperative in NAD Head:  Normocephalic and atraumatic. Eyes:   No icterus.   Conjunctiva pink. PERRLA. Ears:  Normal auditory acuity. Neck:  Supple; no masses or thyroidomegaly Lungs: Respirations even and unlabored. Lungs clear to auscultation bilaterally.   No wheezes, crackles, or rhonchi.  Heart:  Regular rate and rhythm;  Without murmur, clicks, rubs or gallops Abdomen:  Soft, nondistended, mild diffuse tenderness in left upper and lower abdomen. Normal bowel sounds. No appreciable masses or hepatomegaly.  No rebound or guarding.  Rectal:  Not performed. Msk:  Symmetrical without gross deformities.  Strength normal  Extremities:   Without edema, cyanosis or clubbing. Neurologic:  Alert and oriented x3;  grossly normal neurologically. Skin:  Intact without significant lesions or rashes. Psych:  Alert and cooperative. Normal affect.  LAB RESULTS: CBC Latest Ref Rng & Units 05/21/2018 05/21/2018 05/19/2018  WBC 4.0 - 10.5 K/uL - 15.7(H) 11.7(H)  Hemoglobin 12.0 - 15.0 g/dL 12.8 12.3 13.0  Hematocrit 36.0 - 46.0 % - 36.5 38.9  Platelets 150 - 400 K/uL - 239 256    BMET BMP Latest Ref Rng & Units 05/21/2018 05/19/2018 01/07/2018  Glucose 70 - 99 mg/dL 110(H) 91 92  BUN 8 - 23 mg/dL 7(L) 20 12  Creatinine 0.44 - 1.00 mg/dL 0.74 0.87 0.86  BUN/Creat Ratio 12 - 28 - - 14  Sodium 135 - 145 mmol/L 135 133(L) 135  Potassium 3.5 - 5.1 mmol/L 3.8 4.0 4.7  Chloride 98 - 111 mmol/L 98 99 95(L)  CO2 22 - 32 mmol/L _0 Calcium 8.9 - 10.3 mg/dL 9.1 9.0 9.8    LFT Hepatic Function Latest Ref Rng & Units 05/21/2018 05/19/2018 01/07/2018  Total Protein 6.5 - 8.1 g/dL 7.2 7.4 7.1  Albumin 3.5 - 5.0 g/dL 3.9 4.1 4.7  AST 15 - 41 U/L 23 34 20  ALT 0 - 44 U/L _1 Alk Phosphatase 38 - 126 U/L 86 121 102  Total Bilirubin 0.3 - 1.2 mg/dL 0.8 0.6 0.4     STUDIES: Nm Gi Blood Loss  Result Date: 05/21/2018 CLINICAL DATA:  77 year old with rectal bleeding. Recent CT suggested colitis. EXAM: NUCLEAR MEDICINE GASTROINTESTINAL BLEEDING SCAN TECHNIQUE: Sequential abdominal images were obtained following intravenous administration of Tc-24mlabeled red blood cells. RADIOPHARMACEUTICALS:  23.81 mCi Tc-939mertechnetate in-vitro labeled red cells. COMPARISON:  Abdominal CT 05/21/2018 FINDINGS: Imaging was performed for 2 hours. There is no convincing evidence for active GI bleeding on this examination. The area of concern on the recent CT is the descending colon. There is faint uptake along the left side of the abdomen but the activity does not move and not convincing for an area of GI bleeding. IMPRESSION: Negative GI bleeding study.  Electronically Signed   By: AdMarkus Daft.D.   On: 05/21/2018 16:37   Ct Abdomen Pelvis W Contrast  Result Date: 05/21/2018 CLINICAL DATA:  Abdominal pain. Diverticulitis suspected.  Bright red blood after passing gas. EXAM: CT ABDOMEN AND PELVIS WITH CONTRAST TECHNIQUE: Multidetector CT imaging of the abdomen and pelvis was performed using the standard protocol following bolus administration of intravenous contrast. CONTRAST:  2m OMNIPAQUE IOHEXOL 300 MG/ML  SOLN COMPARISON:  CT abdomen and pelvis 04/28/2014 FINDINGS: Lower chest: The lung bases are clear without focal nodule, mass, or airspace disease. The heart size is normal. Hepatobiliary: A 7 mm cyst of the left lobe of the liver is stable. No other intrahepatic lesions are present. There is no intra or extrahepatic biliary dilation. Gallbladder is normal. Pancreas: Mild pancreatic duct dilation is similar the prior study. No obstructing mass lesion is present. The pancreas is otherwise unremarkable. Spleen: Normal in size without focal abnormality. Adrenals/Urinary Tract: The adrenal glands are normal bilaterally. Multiple bilateral renal cysts are again noted. No solid mass lesion is present. The right ureter is dilated without an obstructing lesion. Nephrograms are symmetric. The urinary bladder is moderately dilated there is no mass lesion or outlet obstruction. Stomach/Bowel: The stomach and duodenum are within normal limits. The small bowel is unremarkable. The terminal ileum is within normal limits. The ascending and transverse colon are within normal limits. There is loss of haustral markings and diffuse wall thickening beginning in the distal transverse colon and at the splenic flexure. Adjacent inflammatory changes are present. Wall thickening continues into the descending and proximal sigmoid colon. The more distal sigmoid colon is more normal in appearance. Vascular/Lymphatic: Extensive atherosclerotic calcifications are present in the aorta  and branch vessels without aneurysm. Reproductive: Status post hysterectomy. No adnexal masses. Other: A small amount of free fluid accumulates within the anatomic pelvis. Musculoskeletal: Vertebral body heights and alignment are maintained. No focal lytic or blastic lesions are present. The bony pelvis is intact. The hips are within normal limits bilaterally. Both hips are located. IMPRESSION: 1. Diffuse wall thickening throughout the distal transverse and descending colon compatible with a nonspecific colitis. Findings continue into the proximal sigmoid colon. 2. No perforation or abscess. 3. Free fluid within the anatomic pelvis is likely reactive. 4. Bilateral renal cysts. 5. Stable 7 mm hypodense lesion in the left lobe of the liver. Electronically Signed   By: CSan MorelleM.D.   On: 05/21/2018 11:24      Impression / Plan:   Christina OLVERAis a 77y.o. female with CAD s/p PCI on plavix, AAA, h/o tobacco use admitted with 2 days of left sided abdominal pain and hematochezia.  CT showed left colon thickening. Her presentation is highly c/w ischemic colitis. I discussed with pt and her daughter about observation versus colonoscopy. Given that pt did not have colonoscopy in her life, they prefer to undergo one and particularly to r/o malignancy.   - Continue to hold plavix - CLD - NPO past midnight - Colonoscopy tomorrow - Monitor CBC every 8-12 hrs  I have discussed alternative options, risks & benefits,  which include, but are not limited to, bleeding, infection, perforation,respiratory complication & drug reaction.  The patient agrees with this plan & written consent will be obtained.    Thank you for involving me in the care of this patient.      LOS: 0 days   RSherri Sear MD  05/21/2018, 6:20 PM   Note: This dictation was prepared with Dragon dictation along with smaller phrase technology. Any transcriptional errors that result from this process are unintentional.

## 2018-05-21 NOTE — Progress Notes (Signed)
Pt admitted to room 236 via stretcher. Admission navigator complete. Daughter at bedside.  No questions or concerns at this time.

## 2018-05-21 NOTE — ED Notes (Signed)
Patient transported to CT 

## 2018-05-21 NOTE — ED Provider Notes (Signed)
[facility] Emergency Department Provider Note   ____________________________________________    I have reviewed the triage vital signs and the nursing notes.   HISTORY  Chief Complaint GI Bleeding     HPI Christina Hester is a 77 y.o. female who presents with complaints of rectal bleeding.  Patient reports left lower quadrant abdominal pain over the past 2 days, she was recently in the emergency department and diagnosed with stomach flu, reports her nausea has improved with the nausea medication prescribed she continues to have lower abdominal pain.  Yesterday she went to go have a bowel movement and there was no stool it was bright red blood only.  She denies a history of diverticulitis or diverticular disease.  Does have a history of COPD and occasionally uses oxygen at night.  Medical records demonstrate a history of a AAA   Past Medical History:  Diagnosis Date  . AAA (abdominal aortic aneurysm) ([place])    a. 05/2016 Abd U/S: 3.02 x 3.02 AAA.  Marland Kitchen CAD (coronary artery disease)    a. 02/2017 NSTEMI/Cath: LM nl, LAD mild dzs, D2 min irregs, LCX mild dzs, OM2 50ost, RCA 95p, 15m, L-->R collats, EF 55-65%.  . Cancer ([place])   . COPD (chronic obstructive pulmonary disease) ([place])   . Diastolic dysfunction    a. 02/2017 Echo: EF 55-60%, gr1 DD, ? inf HK, mild MR.  Marland Kitchen Hyperlipidemia   . Hypertension   . PAD (peripheral artery disease) ([place])    a. 05/2016 ABI's: R 1.22, L 1.11.  . Tobacco abuse     Patient Active Problem List   Diagnosis Date Noted  . Acute bronchitis with COPD ([place]) 07/25/2017  . Benign hypertension 07/25/2017  . Carotid stenosis 05/18/2017  . Coronary artery disease 03/06/2017  . S/P cardiac cath 03/06/2017  . Unstable angina ([place]) 03/04/2017  . Demand ischemia ([place]) 03/04/2017  . COPD with acute exacerbation ([place]) 03/03/2017  . Left sided chest pain 03/03/2017  . Multifocal pneumonia 03/03/2017  . Hyponatremia 03/03/2017  .  Dehydration 03/03/2017  . Leukocytosis 03/03/2017  . 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 Pneumonia 07/18/2016  . AAA (abdominal aortic aneurysm) without rupture ([place]) 05/17/2016  . PVD (peripheral vascular disease) ([place]) 05/17/2016  . Protein-calorie malnutrition, severe 04/29/2016  . Community acquired pneumonia 04/28/2016  . Chest pain 11/25/2015    Past Surgical History:  Procedure Laterality Date  . ABDOMINAL HYSTERECTOMY    . BREAST EXCISIONAL BIOPSY Left 80's or 90's   NEG  . LEFT HEART CATH Bilateral 03/05/2017   Procedure: Left Heart Cath with poss PCI;  Surgeon: Wellington Hampshire, MD;  Location: [facility];  Service: Cardiovascular;  Laterality: Bilateral;    Prior to Admission medications   Medication Sig Start Date End Date Taking? Authorizing Provider  amLODipine (NORVASC) 5 MG tablet Take one tab po qhs 09/04/17  Yes Lavera Guise, MD  buPROPion [facility] SR) 100 MG 12 hr tablet Take 1 tablet (100 mg total) by mouth daily. 11/19/17  Yes Ronnell Freshwater, NP  clopidogrel (PLAVIX) 75 MG tablet Take 1 tablet (75 mg total) by mouth daily. 02/25/18  Yes Wellington Hampshire, MD  clotrimazole (MYCELEX) 10 MG troche Take 4 to 5 times a  Day as needed fot thrush 08/20/17  Yes Lavera Guise, MD  fluticasone (FLOVENT HFA) 110 MCG/ACT inhaler Inhale 2 puffs into the lungs 2 (two) times daily. 12/27/17  Yes Lavera Guise, MD  ipratropium-albuterol (DUONEB) 0.5-2.5 (3) MG/3ML SOLN Take 3 mLs by nebulization every 4 (four) hours. 05/02/18  Yes Scarboro, Audie Clear, NP  lisinopril (PRINIVIL,ZESTRIL) 5 MG tablet Take 1 tablet (5 mg total) by mouth daily. 10/11/17  Yes Wellington Hampshire, MD  LORazepam (ATIVAN) 0.5 MG tablet TAKE 1/2 TO 1 TABLET BY MOUTH AT NIGHT 03/05/18  Yes Lavera Guise, MD  nitroGLYCERIN (NITROSTAT) 0.4 MG SL tablet Place 1 tablet (0.4 mg total) under the tongue every 5 (five) minutes as needed for chest pain. 03/06/17  Yes Theodoro Grist, MD  nystatin  (MYCOSTATIN) 100000 UNIT/ML suspension Take 5 mLs (500,000 Units total) by mouth 4 (four) times daily. 12/27/17  Yes Lavera Guise, MD  ondansetron (ZOFRAN ODT) 4 MG disintegrating tablet Take 1 tablet (4 mg total) by mouth every 8 (eight) hours as needed for nausea or vomiting. 05/20/18  Yes Darel Hong, MD  Tiotropium Bromide Monohydrate (SPIRIVA RESPIMAT) 2.5 MCG/ACT AERS Inhale 1 puff into the lungs daily. 12/27/17  Yes Lavera Guise, MD  VENTOLIN HFA 108 [phone] Base) MCG/ACT inhaler INHALE 2 PUFFS INTO THE LUNGS EVERY 6 HOURS AS NEEDED FOR WHEEZING ORSHORTNESS OF BREATH 04/05/18  Yes Allyne Gee, MD  ezetimibe (ZETIA) 10 MG tablet Take 10 mg by mouth every other day.    [provider]     Allergies Aspirin; Prednisone; Sulfa antibiotics; and Symbicort [budesonide-formoterol fumarate]  Family History  Problem Relation Age of Onset  . Breast cancer Mother   . Breast cancer Maternal Aunt   . Breast cancer Maternal Aunt   . Breast cancer Maternal Aunt     Social History Social History   Tobacco Use  . Smoking status: Former Smoker    Packs/day: 0.25    Last attempt to quit: 07/12/2017    Years since quitting: 0.8  . Smokeless tobacco: Never Used  . Tobacco comment: currently 90 days without smoking  Substance Use Topics  . Alcohol use: No  . Drug use: No    Review of Systems  Constitutional: No dizziness Eyes: No visual changes.  ENT: No sore throat. Cardiovascular: Denies chest pain. Respiratory: Breathing is at baseline Gastrointestinal: As above Genitourinary: Negative for dysuria. Musculoskeletal: Negative for back pain. Skin: Negative for rash. Neurological: Negative for headaches   ____________________________________________   PHYSICAL EXAM:  VITAL SIGNS: ED Triage Vitals  Enc Vitals Group     BP 05/21/18 0759 (!) 143/72     Pulse Rate 05/21/18 0759 (!) 105     Resp 05/21/18 0759 18     Temp 05/21/18 0759 97.9 F (36.6 C)     Temp Source  05/21/18 0759 Oral     SpO2 05/21/18 0759 95 %     Weight 05/21/18 0807 38 kg (83 lb 12.4 oz)     Height 05/21/18 0807 1.524 m (5')     Head Circumference --      Peak Flow --      Pain Score 05/21/18 0807 5     Pain Loc --      Pain Edu? --      Excl. in [place]? --     Constitutional: Alert and oriented.  Eyes: Conjunctivae are normal.  Pallor  Mouth/Throat: Mucous membranes are moist.   Neck:  Painless ROM Cardiovascular: Tachycardia, regular rhythm. Grossly normal heart sounds.  Good peripheral circulation. Respiratory: Normal respiratory effort.  No retractions.  Scattered mild wheezes Gastrointestinal: Soft and nontender. No distention.  No CVA tenderness.  Musculoskeletal:  Warm and well perfused Neurologic:  Normal speech and language. No gross focal neurologic deficits are appreciated.  Skin:  Skin is warm, dry and intact. No rash noted. Psychiatric: Mood and affect are normal. Speech and behavior are normal.  ____________________________________________   LABS (all labs ordered are listed, but only abnormal results are displayed)  Labs Reviewed  CBC - Abnormal; Notable for the following components:      Result Value   WBC 15.7 (*)    All other components within normal limits  COMPREHENSIVE METABOLIC PANEL - Abnormal; Notable for the following components:   Glucose, Bld 110 (*)    BUN 7 (*)    All other components within normal limits  APTT  PROTIME-INR  TYPE AND SCREEN   ____________________________________________  EKG  ED ECG REPORT I, Lavonia Drafts, the attending physician, personally viewed and interpreted this ECG.  Date: 05/21/2018  Rhythm: normal sinus rhythm QRS Axis: Right axis Intervals: normal ST/T Wave abnormalities: normal Narrative Interpretation: no evidence of acute ischemia  ____________________________________________  RADIOLOGY  CT abdomen pelvis ____________________________________________   PROCEDURES  Procedure(s)  performed: No  Procedures   Critical Care performed: No ____________________________________________   INITIAL IMPRESSION / ASSESSMENT AND PLAN / ED COURSE  Pertinent labs & imaging results that were available during my care of the patient were reviewed by me and considered in my medical decision making (see chart for details).  Patient overall well-appearing in no acute distress.  Mild tachycardia noted, tenderness palpation of the left lower quadrant, rectal bleeding could be related to diverticular bleed.  Will check labs including type and screen, coags.  The patient is on Plavix.  Obtain CT abdomen pelvis and carefully monitor.  CT scan demonstrates colitis.  Hemoglobin is stable.  However she did have another bowel movement with significant dark blood in it here in the emergency department.  Have discussed with hospitalist for admission    ____________________________________________   FINAL CLINICAL IMPRESSION(S) / ED DIAGNOSES  Final diagnoses:  Gastrointestinal hemorrhage, unspecified gastrointestinal hemorrhage type  Colitis        Note:  This document was prepared using Dragon voice recognition software and may include unintentional dictation errors.    Lavonia Drafts, MD 05/21/18 1228

## 2018-05-21 NOTE — ED Triage Notes (Signed)
Patient reports that she was recently treated for "stomach flu". Reports yesterday she noticed that every time she passed gas, she would have a moderate amount of bright red blood in her underwear. Unsure if she's passing clots and denies any large bowel movements. Also reports abdominal pain.

## 2018-05-21 NOTE — ED Notes (Signed)
ED TO INPATIENT HANDOFF REPORT  Name/Age/Gender Christina Hester 77 y.o. female  Code Status    Code Status Orders  (From admission, onward)         Start     Ordered   05/21/18 1248  Full code  Continuous     05/21/18 1247        Code Status History    Date Active Date Inactive Code Status Order ID Comments User Context   07/14/2017 0930 07/19/2017 1729 Full Code [id number]  Harrie Foreman, MD Inpatient   03/05/2017 1239 03/06/2017 1557 Full Code [id number]  Wellington Hampshire, MD Inpatient   07/18/2016 1824 07/20/2016 2041 Full Code [id number]  Vaughan Basta, MD ED   04/28/2016 0156 04/29/2016 1328 Full Code [id number]  Harvie Bridge, DO Inpatient   11/25/2015 1933 11/26/2015 1519 Full Code [id number]  Nicholes Mango, MD Inpatient      Home/SNF/Other Home  Chief Complaint rectal bleeding  Level of Care/Admitting Diagnosis ED Disposition    ED Disposition Condition Roundup: [facility] [100120]  Level of Care: Med-Surg [16]  Diagnosis: Lower GI bleed [188677]  Admitting Physician: Loletha Grayer [[id number]]  Attending Physician: Loletha Grayer [[id number]]  Estimated length of stay: past midnight tomorrow  Certification:: I certify this patient will need inpatient services for at least 2 midnights  PT Class (Do Not Modify): Inpatient [101]  PT Acc Code (Do Not Modify): Private [1]       Medical History Past Medical History:  Diagnosis Date  . AAA (abdominal aortic aneurysm) ([place])    a. 05/2016 Abd U/S: 3.02 x 3.02 AAA.  Marland Kitchen CAD (coronary artery disease)    a. 02/2017 NSTEMI/Cath: LM nl, LAD mild dzs, D2 min irregs, LCX mild dzs, OM2 50ost, RCA 95p, 163m L-->R collats, EF 55-65%.  . Cancer ([place]   . COPD (chronic obstructive pulmonary disease) ([place]   . Diastolic dysfunction    a. 02/2017 Echo: EF 55-60%, gr1 DD, ? inf HK, mild MR.  .Marland KitchenHyperlipidemia   . Hypertension   . PAD (peripheral artery disease) ([place]    a.  05/2016 ABI's: R 1.22, L 1.11.  . Tobacco abuse     Allergies Allergies  Allergen Reactions  . Aspirin Tinitus  . Prednisone Other (See Comments)    Makes violent  . Sulfa Antibiotics Nausea And Vomiting  . Symbicort [Budesonide-Formoterol Fumarate] Other (See Comments)    Patient felt "out of her mind" and angry.    IV Location/Drains/Wounds Patient Lines/Drains/Airways Status   Active Line/Drains/Airways    Name:   Placement date:   Placement time:   Site:   Days:   Peripheral IV 05/21/18 Left Antecubital   05/21/18    0823    Antecubital   less than 1          Labs/Imaging Results for orders placed or performed during the hospital encounter of 05/21/18 (from the past 48 hour(s))  CBC     Status: Abnormal   Collection Time: 05/21/18  8:23 AM  Result Value Ref Range   WBC 15.7 (H) 4.0 - 10.5 K/uL   RBC 3.96 3.87 - 5.11 MIL/uL   Hemoglobin 12.3 12.0 - 15.0 g/dL   HCT 36.5 36.0 - 46.0 %   MCV 92.2 80.0 - 100.0 fL   MCH 31.1 26.0 - 34.0 pg   MCHC 33.7 30.0 - 36.0 g/dL   RDW 13.2 11.5 - 15.5 %   Platelets 239 150 -  400 K/uL   nRBC 0.0 0.0 - 0.2 %    Comment: Performed at [facility], [address] [place], [place] [address]  Comprehensive metabolic panel     Status: Abnormal   Collection Time: 05/21/18  8:23 AM  Result Value Ref Range   Sodium 135 135 - 145 mmol/L   Potassium 3.8 3.5 - 5.1 mmol/L   Chloride 98 98 - 111 mmol/L   CO2 28 22 - 32 mmol/L   Glucose, Bld 110 (H) 70 - 99 mg/dL   BUN 7 (L) 8 - 23 mg/dL   Creatinine, Ser 0.74 0.44 - 1.00 mg/dL   Calcium 9.1 8.9 - 10.3 mg/dL   Total Protein 7.2 6.5 - 8.1 g/dL   Albumin 3.9 3.5 - 5.0 g/dL   AST 23 15 - 41 U/L   ALT 19 0 - 44 U/L   Alkaline Phosphatase 86 38 - 126 U/L   Total Bilirubin 0.8 0.3 - 1.2 mg/dL   GFR calc non Af Amer >60 >60 mL/min   GFR calc Af Amer >60 >60 mL/min    Comment: (NOTE) The eGFR has been calculated using the CKD EPI equation. This calculation has not been validated  in all clinical situations. eGFR's persistently <60 mL/min signify possible Chronic Kidney Disease.    Anion gap 9 5 - 15    Comment: Performed at [facility], [address] [place], [place] [address]  APTT     Status: None   Collection Time: 05/21/18  8:23 AM  Result Value Ref Range   aPTT 32 24 - 36 seconds    Comment: Performed at [facility], [address] [place], [place] [address]  Protime-INR     Status: None   Collection Time: 05/21/18  8:23 AM  Result Value Ref Range   Prothrombin Time 13.4 11.4 - 15.2 seconds   INR 1.03     Comment: Performed at [facility], [address] [place], [place] [address]  Type and screen [place]     Status: None   Collection Time: 05/21/18  8:23 AM  Result Value Ref Range   ABO/RH(D) A POS    Antibody Screen NEG    Sample Expiration      05/24/2018 Performed at [facility], [address], [place], [place] [address]    Ct Abdomen Pelvis W Contrast  Result Date: 05/21/2018 CLINICAL DATA:  Abdominal pain. Diverticulitis suspected. Bright red blood after passing gas. EXAM: CT ABDOMEN AND PELVIS WITH CONTRAST TECHNIQUE: Multidetector CT imaging of the abdomen and pelvis was performed using the standard protocol following bolus administration of intravenous contrast. CONTRAST:  22m OMNIPAQUE IOHEXOL 300 MG/ML  SOLN COMPARISON:  CT abdomen and pelvis 04/28/2014 FINDINGS: Lower chest: The lung bases are clear without focal nodule, mass, or airspace disease. The heart size is normal. Hepatobiliary: A 7 mm cyst of the left lobe of the liver is stable. No other intrahepatic lesions are present. There is no intra or extrahepatic biliary dilation. Gallbladder is normal. Pancreas: Mild pancreatic duct dilation is similar the prior study. No obstructing mass lesion is present. The pancreas is otherwise unremarkable. Spleen: Normal in size without focal abnormality. Adrenals/Urinary Tract:  The adrenal glands are normal bilaterally. Multiple bilateral renal cysts are again noted. No solid mass lesion is present. The right ureter is dilated without an obstructing lesion. Nephrograms are symmetric. The urinary bladder is moderately dilated there is no mass lesion or outlet obstruction.  Stomach/Bowel: The stomach and duodenum are within normal limits. The small bowel is unremarkable. The terminal ileum is within normal limits. The ascending and transverse colon are within normal limits. There is loss of haustral markings and diffuse wall thickening beginning in the distal transverse colon and at the splenic flexure. Adjacent inflammatory changes are present. Wall thickening continues into the descending and proximal sigmoid colon. The more distal sigmoid colon is more normal in appearance. Vascular/Lymphatic: Extensive atherosclerotic calcifications are present in the aorta and branch vessels without aneurysm. Reproductive: Status post hysterectomy. No adnexal masses. Other: A small amount of free fluid accumulates within the anatomic pelvis. Musculoskeletal: Vertebral body heights and alignment are maintained. No focal lytic or blastic lesions are present. The bony pelvis is intact. The hips are within normal limits bilaterally. Both hips are located. IMPRESSION: 1. Diffuse wall thickening throughout the distal transverse and descending colon compatible with a nonspecific colitis. Findings continue into the proximal sigmoid colon. 2. No perforation or abscess. 3. Free fluid within the anatomic pelvis is likely reactive. 4. Bilateral renal cysts. 5. Stable 7 mm hypodense lesion in the left lobe of the liver. Electronically Signed   By: San Morelle M.D.   On: 05/21/2018 11:24    Pending Labs Unresulted Labs (From admission, onward)    Start     Ordered   05/22/18 4481  Basic metabolic panel  Tomorrow morning,   STAT     05/21/18 1247   05/22/18 0500  CBC  Tomorrow morning,   STAT      05/21/18 1247   05/21/18 1600  Hemoglobin  Now then every 8 hours,   STAT     05/21/18 1303          Vitals/Pain Today's Vitals   05/21/18 1030 05/21/18 1200 05/21/18 1300 05/21/18 1330  BP:    (!) 144/61  Pulse:  81 89 92  Resp: (!) 22 20 (!) 32 (!) 30  Temp:      TempSrc:      SpO2:  97% 96% 96%  Weight:      Height:      PainSc:        Isolation Precautions No active isolations  Medications Medications  iopamidol (ISOVUE-300) 61 % injection 15 mL (15 mLs Oral Contrast Given 05/21/18 0843)  levofloxacin (LEVAQUIN) tablet 500 mg (has no administration in time range)  acetaminophen (TYLENOL) tablet 650 mg (has no administration in time range)    Or  acetaminophen (TYLENOL) suppository 650 mg (has no administration in time range)  ondansetron (ZOFRAN) injection 4 mg (has no administration in time range)  ezetimibe (ZETIA) tablet 10 mg (has no administration in time range)  amLODipine (NORVASC) tablet 5 mg (has no administration in time range)  lisinopril (PRINIVIL,ZESTRIL) tablet 5 mg (has no administration in time range)  nitroGLYCERIN (NITROSTAT) SL tablet 0.4 mg (has no administration in time range)  buPROPion (WELLBUTRIN SR) 12 hr tablet 100 mg (has no administration in time range)  LORazepam (ATIVAN) tablet 0.5 mg (has no administration in time range)  ondansetron (ZOFRAN-ODT) disintegrating tablet 4 mg (has no administration in time range)  fluticasone (FLOVENT HFA) 110 MCG/ACT inhaler 2 puff (has no administration in time range)  ipratropium-albuterol (DUONEB) 0.5-2.5 (3) MG/3ML nebulizer solution 3 mL (has no administration in time range)  Tiotropium Bromide Monohydrate AERS 1 puff (has no administration in time range)  0.9 %  sodium chloride infusion (has no administration in time range)  0.9 %  sodium chloride  infusion (1,000 mLs Intravenous New Bag/Given 05/21/18 0827)  iohexol (OMNIPAQUE) 300 MG/ML solution 80 mL (80 mLs Intravenous Contrast Given 05/21/18 1101)   technetium labeled red blood cells (ULTRATAG) injection kit 54.65 millicurie (68.12 millicuries Intravenous Contrast Given 05/21/18 1415)    Mobility walks

## 2018-05-21 NOTE — Progress Notes (Signed)
Patient ID: Christina Hester, female   DOB: [birth year], 77 y.o.   MRN: [id number]  ACP Note  Patient present  Diagnosis: Lower GI bleed, colitis on CT scan, COPD, hypertension, anxiety, history of CAD  CODE STATUS discussed and patient wishes to be a full code  Plan.  Serial hemoglobins, monitor bleeding.  Bleeding scan.  Empiric antibiotic.  GI consultation.  Continue to hold Plavix.  Time spent on ACP discussion 17 minutes Dr. Loletha Grayer

## 2018-05-22 ENCOUNTER — Inpatient Hospital Stay: Payer: Medicare Other | Admitting: Anesthesiology

## 2018-05-22 ENCOUNTER — Encounter
Admission: EM | Disposition: A | Discharge status: Home / Self Care | Payer: Self-pay | Source: Home / Self Care | Attending: Internal Medicine

## 2018-05-22 ENCOUNTER — Encounter: Payer: Self-pay | Admitting: *Deleted

## 2018-05-22 DIAGNOSIS — K921 Melena: Secondary | ICD-10-CM | POA: Diagnosis not present

## 2018-05-22 DIAGNOSIS — K559 Vascular disorder of intestine, unspecified: Principal | ICD-10-CM

## 2018-05-22 DIAGNOSIS — E785 Hyperlipidemia, unspecified: Secondary | ICD-10-CM | POA: Diagnosis not present

## 2018-05-22 DIAGNOSIS — J441 Chronic obstructive pulmonary disease with (acute) exacerbation: Secondary | ICD-10-CM | POA: Diagnosis not present

## 2018-05-22 DIAGNOSIS — K5790 Diverticulosis of intestine, part unspecified, without perforation or abscess without bleeding: Secondary | ICD-10-CM | POA: Diagnosis not present

## 2018-05-22 DIAGNOSIS — K5730 Diverticulosis of large intestine without perforation or abscess without bleeding: Secondary | ICD-10-CM

## 2018-05-22 DIAGNOSIS — I10 Essential (primary) hypertension: Secondary | ICD-10-CM | POA: Diagnosis not present

## 2018-05-22 HISTORY — PX: COLONOSCOPY: SHX5424

## 2018-05-22 LAB — IRON AND TIBC
Iron: 32 ug/dL (ref 28–170)
SATURATION RATIOS: 14 % (ref 10.4–31.8)
TIBC: 229 ug/dL — ABNORMAL LOW (ref 250–450)
UIBC: 197 ug/dL

## 2018-05-22 LAB — BASIC METABOLIC PANEL
Anion gap: 10 (ref 5–15)
BUN: 9 mg/dL (ref 8–23)
CO2: 25 mmol/L (ref 22–32)
CREATININE: 0.62 mg/dL (ref 0.44–1.00)
Calcium: 8.6 mg/dL — ABNORMAL LOW (ref 8.9–10.3)
Chloride: 97 mmol/L — ABNORMAL LOW (ref 98–111)
GFR calc Af Amer: >60 mL/min (ref >60)
GLUCOSE: 92 mg/dL (ref 70–99)
Potassium: 3.8 mmol/L (ref 3.5–5.1)
SODIUM: 132 mmol/L — AB (ref 135–145)

## 2018-05-22 LAB — CBC
HCT: 30.7 % — ABNORMAL LOW (ref 36.0–46.0)
Hemoglobin: 10.7 g/dL — ABNORMAL LOW (ref 12.0–15.0)
MCH: 31.7 pg (ref 26.0–34.0)
MCHC: 34.9 g/dL (ref 30.0–36.0)
MCV: 90.8 fL (ref 80.0–100.0)
Platelets: 175 10*3/uL (ref 150–400)
RBC: 3.38 MIL/uL — ABNORMAL LOW (ref 3.87–5.11)
RDW: 13.1 % (ref 11.5–15.5)
WBC: 14.4 10*3/uL — AB (ref 4.0–10.5)
nRBC: 0 % (ref 0.0–0.2)

## 2018-05-22 LAB — FERRITIN: Ferritin: 113 ng/mL (ref 11–307)

## 2018-05-22 LAB — HEMOGLOBIN: HEMOGLOBIN: 11.4 g/dL — AB (ref 12.0–15.0)

## 2018-05-22 LAB — FOLATE: FOLATE: 20.7 ng/mL (ref >5.9)

## 2018-05-22 LAB — VITAMIN B12: VITAMIN B 12: 297 pg/mL (ref 180–914)

## 2018-05-22 SURGERY — COLONOSCOPY
Anesthesia: General

## 2018-05-22 MED ORDER — FENTANYL CITRATE (PF) 100 MCG/2ML IJ SOLN
INTRAMUSCULAR | Status: DC | PRN
  Administered 2018-05-22: 50 ug via INTRAVENOUS

## 2018-05-22 MED ORDER — FENTANYL CITRATE (PF) 100 MCG/2ML IJ SOLN
INTRAMUSCULAR | Status: AC
  Filled 2018-05-22: qty 2

## 2018-05-22 MED ORDER — LIDOCAINE HCL (PF) 2 % IJ SOLN
INTRAMUSCULAR | Status: AC
  Filled 2018-05-22: qty 10

## 2018-05-22 MED ORDER — PROPOFOL 500 MG/50ML IV EMUL
INTRAVENOUS | Status: DC | PRN
  Administered 2018-05-22: 100 ug/kg/min via INTRAVENOUS

## 2018-05-22 MED ORDER — PROPOFOL 500 MG/50ML IV EMUL
INTRAVENOUS | Status: AC
  Filled 2018-05-22: qty 50

## 2018-05-22 MED ORDER — CIPROFLOXACIN HCL 500 MG PO TABS
500.0000 mg | ORAL_TABLET | Freq: Two times a day (BID) | ORAL | Status: DC
  Administered 2018-05-22 – 2018-05-23 (×2): 500 mg via ORAL
  Filled 2018-05-22 (×3): qty 1

## 2018-05-22 MED ORDER — PROPOFOL 10 MG/ML IV BOLUS
INTRAVENOUS | Status: DC | PRN
  Administered 2018-05-22 (×2): 20 mg via INTRAVENOUS
  Administered 2018-05-22: 50 mg via INTRAVENOUS

## 2018-05-22 NOTE — Anesthesia Post-op Follow-up Note (Signed)
Anesthesia QCDR form completed.        

## 2018-05-22 NOTE — Progress Notes (Signed)
Tap water enema given to pt. Tolerated well. BM x 2. Clear yellow liquid output after enema.

## 2018-05-22 NOTE — Plan of Care (Signed)

## 2018-05-22 NOTE — Anesthesia Preprocedure Evaluation (Signed)
Anesthesia Evaluation  Patient identified by MRN, date of birth, ID band Patient awake    Reviewed: Allergy & Precautions, NPO status , Patient's Chart, lab work & pertinent test results  History of Anesthesia Complications Negative for: history of anesthetic complications  Airway Mallampati: II       Dental  (+) Upper Dentures, Partial Lower   Pulmonary neg sleep apnea, COPD,  COPD inhaler, former smoker,           Cardiovascular hypertension, Pt. on medications + CAD  (-) Past MI and (-) CHF (-) dysrhythmias (-) Valvular Problems/Murmurs     Neuro/Psych neg Seizures    GI/Hepatic Neg liver ROS, neg GERD  ,  Endo/Other  neg diabetes  Renal/GU negative Renal ROS     Musculoskeletal   Abdominal   Peds  Hematology   Anesthesia Other Findings   Reproductive/Obstetrics                             Anesthesia Physical Anesthesia Plan  ASA: III  Anesthesia Plan: General   Post-op Pain Management:    Induction: Intravenous  PONV Risk Score and Plan: 3 and Treatment may vary due to age or medical condition, TIVA and Propofol infusion  Airway Management Planned: Nasal Cannula  Additional Equipment:   Intra-op Plan:   Post-operative Plan:   Informed Consent: I have reviewed the patients History and Physical, chart, labs and discussed the procedure including the risks, benefits and alternatives for the proposed anesthesia with the patient or authorized representative who has indicated his/her understanding and acceptance.     Plan Discussed with:   Anesthesia Plan Comments:         Anesthesia Quick Evaluation

## 2018-05-22 NOTE — Anesthesia Postprocedure Evaluation (Signed)
Anesthesia Post Note  Patient: Christina Hester  Procedure(s) Performed: COLONOSCOPY (N/A )  Patient location during evaluation: PACU Anesthesia Type: General Level of consciousness: awake and alert Pain management: pain level controlled Vital Signs Assessment: post-procedure vital signs reviewed and stable Respiratory status: spontaneous breathing, nonlabored ventilation, respiratory function stable and patient connected to nasal cannula oxygen Cardiovascular status: blood pressure returned to baseline and stable Postop Assessment: no apparent nausea or vomiting Anesthetic complications: no     Last Vitals:  Vitals:   05/22/18 1301 05/22/18 1302  BP: (!) 99/45 (!) 99/45  Pulse: 85 80  Resp: (!) 25 (!) 22  Temp:  (!) 36 C  SpO2: 99% 98%    Last Pain:  Vitals:   05/22/18 1302  TempSrc: Tympanic  PainSc: 0-No pain                 Molli Barrows

## 2018-05-22 NOTE — Transfer of Care (Signed)
Immediate Anesthesia Transfer of Care Note  Patient: Christina Hester  Procedure(s) Performed: Procedure(s): COLONOSCOPY (N/A)  Patient Location: PACU and Endoscopy Unit  Anesthesia Type:General  Level of Consciousness: sedated  Airway & Oxygen Therapy: Patient Spontanous Breathing and Patient connected to nasal cannula oxygen  Post-op Assessment: Report given to RN and Post -op Vital signs reviewed and stable  Post vital signs: Reviewed and stable  Last Vitals:  Vitals:   05/22/18 0741 05/22/18 1301  BP: (!) 124/53 (!) 99/45  Pulse: 74 85  Resp: 19 (!) 25  Temp: 36.9 C   SpO2: 09% 32%    Complications: No apparent anesthesia complications

## 2018-05-22 NOTE — Op Note (Signed)
[facility] Gastroenterology Patient Name: Christina Hester Procedure Date: 05/22/2018 12:19 PM MRN: [id number] Account #: [id number] Date of Birth: 03-26-1941 Admit Type: Outpatient Age: 77 Room: [facility] ENDO ROOM 3 Gender: Female Note Status: Finalized Procedure:            Colonoscopy Indications:          Hematochezia Providers:            Lin Landsman MD, MD Referring MD:         No Local Md, MD (Referring MD) Medicines:            Monitored Anesthesia Care Complications:        No immediate complications. Estimated blood loss: None. Procedure:            Pre-Anesthesia Assessment:                       - Prior to the procedure, a History and Physical was                        performed, and patient medications and allergies were                        reviewed. The patient is competent. The risks and                        benefits of the procedure and the sedation options and                        risks were discussed with the patient. All questions                        were answered and informed consent was obtained.                        Patient identification and proposed procedure were                        verified by the physician, the nurse, the                        anesthesiologist, the anesthetist and the technician in                        the pre-procedure area in the procedure room in the                        endoscopy suite. Mental Status Examination: alert and                        oriented. Airway Examination: normal oropharyngeal                        airway and neck mobility. Respiratory Examination:                        clear to auscultation. CV Examination: normal.                        Prophylactic Antibiotics: The patient does not require  prophylactic antibiotics. Prior Anticoagulants: The                        patient has taken Plavix (clopidogrel), last dose was 3                        days  prior to procedure. ASA Grade Assessment: III - A                        patient with severe systemic disease. After reviewing                        the risks and benefits, the patient was deemed in                        satisfactory condition to undergo the procedure. The                        anesthesia plan was to use monitored anesthesia care                        (MAC). Immediately prior to administration of                        medications, the patient was re-assessed for adequacy                        to receive sedatives. The heart rate, respiratory rate,                        oxygen saturations, blood pressure, adequacy of                        pulmonary ventilation, and response to care were                        monitored throughout the procedure. The physical status                        of the patient was re-assessed after the procedure.                       After obtaining informed consent, the colonoscope was                        passed under direct vision. Throughout the procedure,                        the patient's blood pressure, pulse, and oxygen                        saturations were monitored continuously. The Endoscope                        was introduced through the anus and advanced to the the                        transverse colon. The colonoscopy was unusually  difficult due to multiple diverticula in the colon, a                        tortuous colon and the patient's body habitus.                        Successful completion of the procedure was aided by                        changing the patient to a supine position and applying                        abdominal pressure. The patient tolerated the procedure                        well. The quality of the bowel preparation was adequate. Findings:      The perianal and digital rectal examinations were normal. Pertinent       negatives include normal sphincter tone and no  palpable rectal lesions.      Diffuse moderate inflammation characterized by altered vascularity,       congestion (edema), erythema, friability, loss of vascularity and mucus       was found in the sigmoid colon and in the descending colon. Biopsies       were taken with a cold forceps for histology.      Multiple diverticula were found in the recto-sigmoid colon and sigmoid       colon. There was narrowing of the colon in association with the       diverticular opening. There was no evidence of diverticular bleeding.      The retroflexed view of the distal rectum and anal verge was normal and       showed no anal or rectal abnormalities. Impression:           - Diffuse moderate inflammation was found in the                        sigmoid colon and in the descending colon secondary to                        ischemic colitis. Biopsied.                       - Severe diverticulosis in the recto-sigmoid colon and                        in the sigmoid colon. There was narrowing of the colon                        in association with the diverticular opening. There was                        no evidence of diverticular bleeding.                       - The distal rectum and anal verge are normal on                        retroflexion view. Recommendation:       - Return patient to hospital  ward for ongoing care.                       - Cardiac diet today.                       - Continue present medications.                       - Await pathology results.                       - Resume Plavix (clopidogrel) at prior dose in 2 days. Procedure Code(s):    --- Professional ---                       [id number], 18, Colonoscopy, flexible; with biopsy, single                        or multiple Diagnosis Code(s):    --- Professional ---                       K55.9, Vascular disorder of intestine, unspecified                       K92.1, Melena (includes Hematochezia)                       K57.30,  Diverticulosis of large intestine without                        perforation or abscess without bleeding CPT copyright 2018 [employer]. All rights reserved. The codes documented in this report are preliminary and upon coder review may  be revised to meet current compliance requirements. Dr. Ulyess Mort Lin Landsman MD, MD 05/22/2018 1:01:28 PM This report has been signed electronically. Number of Addenda: 0 Note Initiated On: 05/22/2018 12:19 PM Total Procedure Duration: 0 hours 12 minutes 47 seconds       [facility]

## 2018-05-22 NOTE — Anesthesia Procedure Notes (Signed)
Date/Time: 05/22/2018 12:42 PM Performed by: Doreen Salvage, CRNA Pre-anesthesia Checklist: Patient identified, Emergency Drugs available, Suction available and Patient being monitored Patient Re-evaluated:Patient Re-evaluated prior to induction Oxygen Delivery Method: Nasal cannula Induction Type: IV induction Dental Injury: Teeth and Oropharynx as per pre-operative assessment  Comments: Nasal cannula with etCO2 monitoring

## 2018-05-23 ENCOUNTER — Encounter: Payer: Self-pay | Admitting: Gastroenterology

## 2018-05-23 DIAGNOSIS — K55039 Acute (reversible) ischemia of large intestine, extent unspecified: Secondary | ICD-10-CM

## 2018-05-23 LAB — CBC
HCT: 29.6 % — ABNORMAL LOW (ref 36.0–46.0)
Hemoglobin: 10.1 g/dL — ABNORMAL LOW (ref 12.0–15.0)
MCH: 31 pg (ref 26.0–34.0)
MCHC: 34.1 g/dL (ref 30.0–36.0)
MCV: 90.8 fL (ref 80.0–100.0)
PLATELETS: 200 10*3/uL (ref 150–400)
RBC: 3.26 MIL/uL — ABNORMAL LOW (ref 3.87–5.11)
RDW: 12.7 % (ref 11.5–15.5)
WBC: 7.8 10*3/uL (ref 4.0–10.5)
nRBC: 0 % (ref 0.0–0.2)

## 2018-05-23 LAB — BASIC METABOLIC PANEL
Anion gap: 8 (ref 5–15)
BUN: 11 mg/dL (ref 8–23)
CALCIUM: 8.7 mg/dL — AB (ref 8.9–10.3)
CO2: 27 mmol/L (ref 22–32)
CREATININE: 0.7 mg/dL (ref 0.44–1.00)
Chloride: 98 mmol/L (ref 98–111)
GFR calc Af Amer: >60 mL/min (ref >60)
Glucose, Bld: 86 mg/dL (ref 70–99)
Potassium: 3.6 mmol/L (ref 3.5–5.1)
SODIUM: 133 mmol/L — AB (ref 135–145)

## 2018-05-23 LAB — SURGICAL PATHOLOGY

## 2018-05-23 MED ORDER — ENSURE ENLIVE PO LIQD: 237.0000 mL | Freq: Two times a day (BID) | ORAL | Status: DC

## 2018-05-23 MED ORDER — CLOPIDOGREL BISULFATE 75 MG PO TABS: 75.0000 mg | ORAL_TABLET | Freq: Every day | ORAL | 3 refills | Status: DC

## 2018-05-23 MED ORDER — ADULT MULTIVITAMIN W/MINERALS CH
1.0000 | ORAL_TABLET | Freq: Every day | ORAL | Status: DC
  Administered 2018-05-23: 1 via ORAL
  Filled 2018-05-23: qty 1

## 2018-05-23 NOTE — Progress Notes (Signed)
[facility] [place] NAME: Christina Hester    MR#:  [id number]  DATE OF BIRTH:  Jul 26, 1940  SUBJECTIVE:  CHIEF COMPLAINT:   Chief Complaint  Patient presents with  . GI Bleeding   Came with frank GI bleed without much pain. Status post procedure and no complaints.  Tolerating diet.  REVIEW OF SYSTEMS:  CONSTITUTIONAL: No fever, fatigue or weakness.  EYES: No blurred or double vision.  EARS, NOSE, AND THROAT: No tinnitus or ear pain.  RESPIRATORY: No cough, shortness of breath, wheezing or hemoptysis.  CARDIOVASCULAR: No chest pain, orthopnea, edema.  GASTROINTESTINAL: No nausea, vomiting, diarrhea or abdominal pain.  GENITOURINARY: No dysuria, hematuria.  ENDOCRINE: No polyuria, nocturia,  HEMATOLOGY: No anemia, easy bruising or bleeding SKIN: No rash or lesion. MUSCULOSKELETAL: No joint pain or arthritis.   NEUROLOGIC: No tingling, numbness, weakness.  PSYCHIATRY: No anxiety or depression.   ROS  DRUG ALLERGIES:   Allergies  Allergen Reactions  . Aspirin Tinitus  . Prednisone Other (See Comments)    Makes violent  . Sulfa Antibiotics Nausea And Vomiting  . Symbicort [Budesonide-Formoterol Fumarate] Other (See Comments)    Patient felt "out of her mind" and angry.    VITALS:  Blood pressure (!) 124/59, pulse 73, temperature 97.7 F (36.5 C), temperature source Oral, resp. rate 18, height 5' (1.524 m), weight 37.1 kg, SpO2 95 %.  PHYSICAL EXAMINATION:  GENERAL:  77 y.o.-year-old patient lying in the bed with no acute distress.  EYES: Pupils equal, round, reactive to light and accommodation. No scleral icterus. Extraocular muscles intact.  HEENT: Head atraumatic, normocephalic. Oropharynx and nasopharynx clear.  NECK:  Supple, no jugular venous distention. No thyroid enlargement, no tenderness.  LUNGS: Normal breath sounds bilaterally, no wheezing, rales,rhonchi or crepitation. No use of accessory muscles of respiration.   CARDIOVASCULAR: S1, S2 normal. No murmurs, rubs, or gallops.  ABDOMEN: Soft, nontender, nondistended. Bowel sounds present. No organomegaly or mass.  EXTREMITIES: No pedal edema, cyanosis, or clubbing.  NEUROLOGIC: Cranial nerves II through XII are intact. Muscle strength 5/5 in all extremities. Sensation intact. Gait not checked.  PSYCHIATRIC: The patient is alert and oriented x 3.  SKIN: No obvious rash, lesion, or ulcer.   Physical Exam LABORATORY PANEL:   CBC Recent Labs  Lab 05/23/18 0359  WBC 7.8  HGB 10.1*  HCT 29.6*  PLT 200   ------------------------------------------------------------------------------------------------------------------  Chemistries  Recent Labs  Lab 05/21/18 0823  05/23/18 0359  NA 135   < > 133*  K 3.8   < > 3.6  CL 98   < > 98  CO2 28   < > 27  GLUCOSE 110*   < > 86  BUN 7*   < > 11  CREATININE 0.74   < > 0.70  CALCIUM 9.1   < > 8.7*  AST 23  --   --   ALT 19  --   --   ALKPHOS 86  --   --   BILITOT 0.8  --   --    < > = values in this interval not displayed.   ------------------------------------------------------------------------------------------------------------------  Cardiac Enzymes No results for input(s): TROPONINI in the last 168 hours. ------------------------------------------------------------------------------------------------------------------  RADIOLOGY:  Nm Gi Blood Loss  Result Date: 05/21/2018 CLINICAL DATA:  77 year old with rectal bleeding. Recent CT suggested colitis. EXAM: NUCLEAR MEDICINE GASTROINTESTINAL BLEEDING SCAN TECHNIQUE: Sequential abdominal images were obtained following intravenous administration of Tc-47m labeled red blood cells. RADIOPHARMACEUTICALS:  23.81 mCi Tc-37m pertechnetate in-vitro labeled red cells. COMPARISON:  Abdominal CT 05/21/2018 FINDINGS: Imaging was performed for 2 hours. There is no convincing evidence for active GI bleeding on this examination. The area of concern on the recent  CT is the descending colon. There is faint uptake along the left side of the abdomen but the activity does not move and not convincing for an area of GI bleeding. IMPRESSION: Negative GI bleeding study. Electronically Signed   By: Markus Daft M.D.   On: 05/21/2018 16:37    ASSESSMENT AND PLAN:   Active Problems:   Lower GI bleed  *  Lower GI bleed with colitis seen on CT scan.   Stool studies were negative 2 days ago .  Empiric ciprofloxacin.    Stable Serial hemoglobins.  Patient is never had a colonoscopy.  GI consultation-colonoscopy done which showed ischemic colitis. Continue to hold Plavix.    Started diet for now. * Ischemic colitis-called vascular consult as patient also have abdominal aortic aneurysm. *  Essential hypertension continue antihypertensives *  COPD.  Respiratory status stable *  Anxiety on Wellbutrin and Ativan *  History of CAD.  Hold Plavix at this time.    All the records are reviewed and case discussed with Care Management/Social Workerr. Management plans discussed with the patient, family and they are in agreement.  CODE STATUS: Full code.  TOTAL TIME TAKING CARE OF THIS PATIENT: 35 minutes.     POSSIBLE D/C IN 1-2 DAYS, DEPENDING ON CLINICAL CONDITION.   Vaughan Basta M.D on 05/23/2018   Between 7am to 6pm - Pager - [phone]  After 6pm go to [email or web address] - password [email or web address] [facility]  Office  [phone]  CC: Primary care physician; Lavera Guise, MD  Note: This dictation was prepared with Dragon dictation along with smaller phrase technology. Any transcriptional errors that result from this process are unintentional.

## 2018-05-23 NOTE — Plan of Care (Signed)

## 2018-05-23 NOTE — Consult Note (Signed)
[facility] Vascular Consult Note  MRN : [id number]  Christina Hester is a 77 y.o. (02-28-1941) female who presents with chief complaint of  Chief Complaint  Patient presents with  . GI Bleeding   History of Present Illness:  The patient is a 77 year old female with a past medical history of AAA, CAD, COPD, hyperlipidemia, hypertension, peripheral artery disease, tobacco abuse well-known to our office as we follow her for her AAA.  The patient presented to the emergency department after experiencing multiple episodes of bright red blood per rectum.  The patient underwent a EGD the which was notable for "diffuse moderate inflammation was found in the sigmoid colon and in the descending colon secondary to ischemic colitis. Biopsied. Severe diverticulosis in the recto-sigmoid colon and in the sigmoid colon. There was narrowing of the colon in association with the diverticular opening. There was no evidence of diverticular bleeding. The distal rectum and anal verge are normal on retroflexion view.  Upon my examination today, the patient denies any abdominal pain.  The patient denies any fever, nausea or vomiting.  The patient has not had a bloody bowel movement today.  The patient is passing gas.  Biopsy taken during colonoscopy positive for ischemic colitis.  Vascular surgery was consulted for possible recommendations regarding this by [facility].  Current Facility-Administered Medications  Medication Dose Route Frequency Provider Last Rate Last Dose  . acetaminophen (TYLENOL) tablet 650 mg  650 mg Oral Q6H PRN Loletha Grayer, MD       Or  . acetaminophen (TYLENOL) suppository 650 mg  650 mg Rectal Q6H PRN Wieting, Richard, MD      . amLODipine (NORVASC) tablet 5 mg  5 mg Oral QHS Loletha Grayer, MD   5 mg at 05/22/18 2234  . budesonide (PULMICORT) nebulizer solution 0.25 mg  0.25 mg Nebulization BID Loletha Grayer, MD   0.25 mg at 05/23/18 0714  . buPROPion  (WELLBUTRIN SR) 12 hr tablet 100 mg  100 mg Oral Daily Loletha Grayer, MD   100 mg at 05/23/18 0907  . ciprofloxacin (CIPRO) tablet 500 mg  500 mg Oral BID Vaughan Basta, MD   500 mg at 05/23/18 0907  . ezetimibe (ZETIA) tablet 10 mg  10 mg Oral QODAY Wieting, Richard, MD      . feeding supplement (ENSURE ENLIVE) (ENSURE ENLIVE) liquid 237 mL  237 mL Oral BID BM Vaughan Basta, MD      . ipratropium-albuterol (DUONEB) 0.5-2.5 (3) MG/3ML nebulizer solution 3 mL  3 mL Nebulization Q6H PRN Loletha Grayer, MD   3 mL at 05/21/18 1644  . lisinopril (PRINIVIL,ZESTRIL) tablet 5 mg  5 mg Oral Daily Loletha Grayer, MD   5 mg at 05/23/18 0906  . LORazepam (ATIVAN) tablet 0.5 mg  0.5 mg Oral QHS Loletha Grayer, MD   0.5 mg at 05/22/18 2234  . multivitamin with minerals tablet 1 tablet  1 tablet Oral Daily Vaughan Basta, MD   1 tablet at 05/23/18 0906  . nitroGLYCERIN (NITROSTAT) SL tablet 0.4 mg  0.4 mg Sublingual Q5 min PRN Loletha Grayer, MD      . ondansetron [facility]) injection 4 mg  4 mg Intravenous Q6H PRN Loletha Grayer, MD   4 mg at 05/21/18 2153  . ondansetron (ZOFRAN-ODT) disintegrating tablet 4 mg  4 mg Oral Q8H PRN Loletha Grayer, MD      . tiotropium (SPIRIVA) inhalation capsule ([facility] use ONLY) 18 mcg  1 capsule Inhalation Daily Loletha Grayer, MD  18 mcg at 05/23/18 1610   Past Medical History:  Diagnosis Date  . AAA (abdominal aortic aneurysm) ([place])    a. 05/2016 Abd U/S: 3.02 x 3.02 AAA.  Marland Kitchen CAD (coronary artery disease)    a. 02/2017 NSTEMI/Cath: LM nl, LAD mild dzs, D2 min irregs, LCX mild dzs, OM2 50ost, RCA 95p, 122m, L-->R collats, EF 55-65%.  . Cancer ([place])   . COPD (chronic obstructive pulmonary disease) ([place])   . Diastolic dysfunction    a. 02/2017 Echo: EF 55-60%, gr1 DD, ? inf HK, mild MR.  Marland Kitchen Hyperlipidemia   . Hypertension   . PAD (peripheral artery disease) ([place])    a. 05/2016 ABI's: R 1.22, L 1.11.  . Tobacco abuse    Past Surgical  History:  Procedure Laterality Date  . ABDOMINAL HYSTERECTOMY    . APPENDECTOMY    . BREAST EXCISIONAL BIOPSY Left 80's or 90's   NEG  . COLONOSCOPY N/A 05/22/2018   Procedure: COLONOSCOPY;  Surgeon: Lin Landsman, MD;  Location: [facility] ENDOSCOPY;  Service: Gastroenterology;  Laterality: N/A;  . LEFT HEART CATH Bilateral 03/05/2017   Procedure: Left Heart Cath with poss PCI;  Surgeon: Wellington Hampshire, MD;  Location: [place] CV LAB;  Service: Cardiovascular;  Laterality: Bilateral;   Social History Social History   Tobacco Use  . Smoking status: Former Smoker    Packs/day: 0.25    Last attempt to quit: 07/12/2017    Years since quitting: 0.8  . Smokeless tobacco: Never Used  . Tobacco comment: currently 90 days without smoking  Substance Use Topics  . Alcohol use: No  . Drug use: No   Family History Family History  Problem Relation Age of Onset  . Breast cancer Mother   . Dementia Mother   . Hypertension Mother   . Breast cancer Maternal Aunt   . Breast cancer Maternal Aunt   . Breast cancer Maternal Aunt   . CVA Father   Patient denies any family history of peripheral artery disease, venous disease or renal disease.  Allergies  Allergen Reactions  . Aspirin Tinitus  . Prednisone Other (See Comments)    Makes violent  . Sulfa Antibiotics Nausea And Vomiting  . Symbicort [Budesonide-Formoterol Fumarate] Other (See Comments)    Patient felt "out of her mind" and angry.   REVIEW OF SYSTEMS (Negative unless checked)  Constitutional: [] Weight loss  [] Fever  [] Chills Cardiac: [] Chest pain   [] Chest pressure   [] Palpitations   [] Shortness of breath when laying flat   [] Shortness of breath at rest   [] Shortness of breath with exertion. Vascular:  [] Pain in legs with walking   [] Pain in legs at rest   [] Pain in legs when laying flat   [] Claudication   [] Pain in feet when walking  [] Pain in feet at rest  [] Pain in feet when laying flat   [] History of DVT   [] Phlebitis    [] Swelling in legs   [] Varicose veins   [] Non-healing ulcers Pulmonary:   [] Uses home oxygen   [] Productive cough   [] Hemoptysis   [] Wheeze  [] COPD   [] Asthma Neurologic:  [] Dizziness  [] Blackouts   [] Seizures   [] History of stroke   [] History of TIA  [] Aphasia   [] Temporary blindness   [] Dysphagia   [] Weakness or numbness in arms   [] Weakness or numbness in legs Musculoskeletal:  [] Arthritis   [] Joint swelling   [] Joint pain   [] Low back pain Hematologic:  [] Easy bruising  [] Easy bleeding   []   Hypercoagulable state   [] Anemic  [] Hepatitis Gastrointestinal:  [x] Blood in stool   [] Vomiting blood  [] Gastroesophageal reflux/heartburn   [] Difficulty swallowing. Genitourinary:  [] Chronic kidney disease   [] Difficult urination  [] Frequent urination  [] Burning with urination   [] Blood in urine Skin:  [] Rashes   [] Ulcers   [] Wounds Psychological:  [] History of anxiety   []  History of major depression.  Physical Examination  Vitals:   05/22/18 2238 05/23/18 0416 05/23/18 0715 05/23/18 0811  BP:  (!) 145/60  (!) 124/59  Pulse:  82  73  Resp:  18    Temp:  97.8 F (36.6 C)  97.7 F (36.5 C)  TempSrc:  Oral  Oral  SpO2: 96% 91% 94% 95%  Weight:      Height:       Body mass index is 15.98 kg/m. Gen:  WD/WN, NAD Head: [place]/AT, No temporalis wasting. Prominent temp pulse not noted. Ear/Nose/Throat: Hearing grossly intact, nares w/o erythema or drainage, oropharynx w/o Erythema/Exudate Eyes: Sclera non-icteric, conjunctiva clear Neck: Trachea midline.  No JVD.  Pulmonary:  Good air movement, respirations not labored, equal bilaterally.  Cardiac: RRR, normal S1, S2. Vascular:  Vessel Right Left  Radial Palpable Palpable  Ulnar Palpable Palpable  Brachial Palpable Palpable  Carotid Palpable, without bruit Palpable, without bruit  Aorta Not palpable N/A  Femoral Palpable Palpable  Popliteal Palpable Palpable  PT Palpable Palpable  DP Palpable Palpable   Gastrointestinal: soft,  non-tender/non-distended. No guarding/reflex.  Musculoskeletal: M/S 5/5 throughout.  Extremities without ischemic changes.  No deformity or atrophy. No edema. Neurologic: Sensation grossly intact in extremities.  Symmetrical.  Speech is fluent. Motor exam as listed above. Psychiatric: Judgment intact, Mood & affect appropriate for pt's clinical situation. Dermatologic: No rashes or ulcers noted.  No cellulitis or open wounds. Lymph : No Cervical, Axillary, or Inguinal lymphadenopathy.  CBC Lab Results  Component Value Date   WBC 7.8 05/23/2018   HGB 10.1 (L) 05/23/2018   HCT 29.6 (L) 05/23/2018   MCV 90.8 05/23/2018   PLT 200 05/23/2018   BMET    Component Value Date/Time   NA 133 (L) 05/23/2018 0359   NA 135 01/07/2018 0858   NA 139 04/28/2014 1649   K 3.6 05/23/2018 0359   K 3.5 04/28/2014 1649   CL 98 05/23/2018 0359   CL 100 04/28/2014 1649   CO2 27 05/23/2018 0359   CO2 30 04/28/2014 1649   GLUCOSE 86 05/23/2018 0359   GLUCOSE 94 04/28/2014 1649   BUN 11 05/23/2018 0359   BUN 12 01/07/2018 0858   BUN 12 04/28/2014 1649   CREATININE 0.70 05/23/2018 0359   CREATININE 0.64 04/28/2014 1649   CALCIUM 8.7 (L) 05/23/2018 0359   CALCIUM 9.2 04/28/2014 1649   GFRNONAA >60 05/23/2018 0359   GFRNONAA >60 04/28/2014 1649   GFRAA >60 05/23/2018 0359   GFRAA >60 04/28/2014 1649   Estimated Creatinine Clearance: 35 mL/min (by C-G formula based on SCr of 0.7 mg/dL).  COAG Lab Results  Component Value Date   INR 1.03 05/21/2018   INR 1.13 03/03/2017   INR 1.04 11/26/2015   Radiology Nm Gi Blood Loss  Result Date: 05/21/2018 CLINICAL DATA:  77 year old with rectal bleeding. Recent CT suggested colitis. EXAM: NUCLEAR MEDICINE GASTROINTESTINAL BLEEDING SCAN TECHNIQUE: Sequential abdominal images were obtained following intravenous administration of Tc-30m labeled red blood cells. RADIOPHARMACEUTICALS:  23.81 mCi Tc-11m pertechnetate in-vitro labeled red cells. COMPARISON:   Abdominal CT 05/21/2018 FINDINGS: Imaging was performed for 2 hours.  There is no convincing evidence for active GI bleeding on this examination. The area of concern on the recent CT is the descending colon. There is faint uptake along the left side of the abdomen but the activity does not move and not convincing for an area of GI bleeding. IMPRESSION: Negative GI bleeding study. Electronically Signed   By: Markus Daft M.D.   On: 05/21/2018 16:37   Ct Abdomen Pelvis W Contrast  Result Date: 05/21/2018 CLINICAL DATA:  Abdominal pain. Diverticulitis suspected. Bright red blood after passing gas. EXAM: CT ABDOMEN AND PELVIS WITH CONTRAST TECHNIQUE: Multidetector CT imaging of the abdomen and pelvis was performed using the standard protocol following bolus administration of intravenous contrast. CONTRAST:  14mL OMNIPAQUE IOHEXOL 300 MG/ML  SOLN COMPARISON:  CT abdomen and pelvis 04/28/2014 FINDINGS: Lower chest: The lung bases are clear without focal nodule, mass, or airspace disease. The heart size is normal. Hepatobiliary: A 7 mm cyst of the left lobe of the liver is stable. No other intrahepatic lesions are present. There is no intra or extrahepatic biliary dilation. Gallbladder is normal. Pancreas: Mild pancreatic duct dilation is similar the prior study. No obstructing mass lesion is present. The pancreas is otherwise unremarkable. Spleen: Normal in size without focal abnormality. Adrenals/Urinary Tract: The adrenal glands are normal bilaterally. Multiple bilateral renal cysts are again noted. No solid mass lesion is present. The right ureter is dilated without an obstructing lesion. Nephrograms are symmetric. The urinary bladder is moderately dilated there is no mass lesion or outlet obstruction. Stomach/Bowel: The stomach and duodenum are within normal limits. The small bowel is unremarkable. The terminal ileum is within normal limits. The ascending and transverse colon are within normal limits. There is loss  of haustral markings and diffuse wall thickening beginning in the distal transverse colon and at the splenic flexure. Adjacent inflammatory changes are present. Wall thickening continues into the descending and proximal sigmoid colon. The more distal sigmoid colon is more normal in appearance. Vascular/Lymphatic: Extensive atherosclerotic calcifications are present in the aorta and branch vessels without aneurysm. Reproductive: Status post hysterectomy. No adnexal masses. Other: A small amount of free fluid accumulates within the anatomic pelvis. Musculoskeletal: Vertebral body heights and alignment are maintained. No focal lytic or blastic lesions are present. The bony pelvis is intact. The hips are within normal limits bilaterally. Both hips are located. IMPRESSION: 1. Diffuse wall thickening throughout the distal transverse and descending colon compatible with a nonspecific colitis. Findings continue into the proximal sigmoid colon. 2. No perforation or abscess. 3. Free fluid within the anatomic pelvis is likely reactive. 4. Bilateral renal cysts. 5. Stable 7 mm hypodense lesion in the left lobe of the liver. Electronically Signed   By: San Morelle M.D.   On: 05/21/2018 11:24    Assessment/Plan The patient is a 77 year old female with a past medical history of AAA, CAD, COPD, hyperlipidemia, hypertension, peripheral artery disease, tobacco abuse well-known to our office as we follow her for her AAA.  Patient was admitted from the [facility] emergency department after experiencing multiple episodes of bright red blood per rectum and was found to have severe diverticulitis.  On biopsy the patient was found to have ischemic colitis. 1.  Ischemic colitis: Reviewed CT scan with Dr. Lucky Cowboy.  The CT is a 36mm cut poorly contrasted scan with large amounts of contrast in the intestine which limits the view however the patient's celiac, SMA and IMA are patent.  At this time, there is no  role  for endovascular intervention.  If the patient should continue to show signs of worsening colonic ischemia would recommend consulting general surgery for possible surgical intervention. 2. AAA: Patient with a stable AAA.  We reviewed this on her most recent CT as well. We will continue to follow this as an outpatient.  No indication for endovascular/open intervention at this time. The patient should follow her normal surveillance routine in our office. 3.  Hypertension: Encouraged good control as its slows the progression of atherosclerotic and aneurysmal disease 4.  Hyperlipidemia: Encouraged good control as its slows the progression of atherosclerotic and aneurysmal disease 5. Tobacco abuse: We had a discussion for approximately three minutes regarding the absolute need for smoking cessation due to the deleterious nature of tobacco on the vascular system. We discussed the tobacco use would diminish patency of any intervention, and likely significantly worsen progressio of disease. We discussed multiple agents for quitting including replacement therapy or medications to reduce cravings such as Chantix. The patient voices their understanding of the importance of smoking cessation.  Discussed with Dr. Mayme Genta, PA-C  05/23/2018 11:58 AM  This note was created with Dragon medical transcription system.  Any error is purely unintentional.

## 2018-05-23 NOTE — Discharge Summary (Signed)
[place] [facility] [place] NAME: Christina Hester    MR#:  [id number]  DATE OF BIRTH:  [birth year]  DATE OF ADMISSION:  05/21/2018 ADMITTING PHYSICIAN: Loletha Grayer, MD  DATE OF DISCHARGE: 05/23/2018  2:45 PM  PRIMARY CARE PHYSICIAN: Lavera Guise, MD    ADMISSION DIAGNOSIS:  Colitis [K52.9] Gastrointestinal hemorrhage, unspecified gastrointestinal hemorrhage type [K92.2]  DISCHARGE DIAGNOSIS:  Active Problems:   Lower GI bleed   SECONDARY DIAGNOSIS:   Past Medical History:  Diagnosis Date  . AAA (abdominal aortic aneurysm) ([place])    a. 05/2016 Abd U/S: 3.02 x 3.02 AAA.  Marland Kitchen CAD (coronary artery disease)    a. 02/2017 NSTEMI/Cath: LM nl, LAD mild dzs, D2 min irregs, LCX mild dzs, OM2 50ost, RCA 95p, 166m, L-->R collats, EF 55-65%.  . Cancer ([place])   . COPD (chronic obstructive pulmonary disease) ([place])   . Diastolic dysfunction    a. 02/2017 Echo: EF 55-60%, gr1 DD, ? inf HK, mild MR.  Marland Kitchen Hyperlipidemia   . Hypertension   . PAD (peripheral artery disease) ([place])    a. 05/2016 ABI's: R 1.22, L 1.11.  . Tobacco abuse     HOSPITAL COURSE:   * Lower GI bleed with colitis seen on CT scan.  Stool studies were negative2 days ago . Empiric ciprofloxacin.   StableSerial hemoglobins. Patient is never had a colonoscopy. GI consultation-colonoscopy done which showed ischemic colitis.Continue to hold Plavix.   Started diet for now. GI suggested to restart Plavix after 2 days.  Patient was educated about this. * Ischemic colitis-called vascular consult as patient also have abdominal aortic aneurysm. Vascular suggested no further work-up.  If patient has recurrent episodes stand suggested to consider surgical consult. * Essential hypertension continue antihypertensives * COPD. Respiratory status stable * Anxiety on Wellbutrin and Ativan * History of CAD. Hold Plavix at this time.     Restart Plavix in the next 2 days.  DISCHARGE  CONDITIONS:   Stable  CONSULTS OBTAINED:  Treatment Team:  Lin Landsman, MD Schnier, Dolores Lory, MD  DRUG ALLERGIES:   Allergies  Allergen Reactions  . Aspirin Tinitus  . Prednisone Other (See Comments)    Makes violent  . Sulfa Antibiotics Nausea And Vomiting  . Symbicort [Budesonide-Formoterol Fumarate] Other (See Comments)    Patient felt "out of her mind" and angry.    DISCHARGE MEDICATIONS:   Allergies as of 05/23/2018      Reactions   Aspirin Tinitus   Prednisone Other (See Comments)   Makes violent   Sulfa Antibiotics Nausea And Vomiting   Symbicort [budesonide-formoterol Fumarate] Other (See Comments)   Patient felt "out of her mind" and angry.      Medication List    TAKE these medications   amLODipine 5 MG tablet Commonly known as:  NORVASC Take one tab po qhs   buPROPion 100 MG 12 hr tablet Commonly known as:  WELLBUTRIN SR Take 1 tablet (100 mg total) by mouth daily.   clopidogrel 75 MG tablet Commonly known as:  PLAVIX Take 1 tablet (75 mg total) by mouth daily. Start taking on:  05/25/2018 What changed:  These instructions start on 05/25/2018. If you are unsure what to do until then, ask your doctor or other care provider.   clotrimazole 10 MG troche Commonly known as:  MYCELEX Take 4 to 5 times a  Day as needed fot thrush   ezetimibe 10 MG tablet Commonly known as:  ZETIA  Take 10 mg by mouth every other day.   fluticasone 110 MCG/ACT inhaler Commonly known as:  FLOVENT HFA Inhale 2 puffs into the lungs 2 (two) times daily.   ipratropium-albuterol 0.5-2.5 (3) MG/3ML Soln Commonly known as:  DUONEB Take 3 mLs by nebulization every 4 (four) hours.   lisinopril 5 MG tablet Commonly known as:  PRINIVIL,ZESTRIL Take 1 tablet (5 mg total) by mouth daily.   LORazepam 0.5 MG tablet Commonly known as:  ATIVAN TAKE 1/2 TO 1 TABLET BY MOUTH AT NIGHT   nitroGLYCERIN 0.4 MG SL tablet Commonly known as:  NITROSTAT Place 1 tablet (0.4 mg  total) under the tongue every 5 (five) minutes as needed for chest pain.   nystatin 100000 UNIT/ML suspension Commonly known as:  MYCOSTATIN Take 5 mLs (500,000 Units total) by mouth 4 (four) times daily.   ondansetron 4 MG disintegrating tablet Commonly known as:  ZOFRAN-ODT Take 1 tablet (4 mg total) by mouth every 8 (eight) hours as needed for nausea or vomiting.   Tiotropium Bromide Monohydrate 2.5 MCG/ACT Aers Inhale 1 puff into the lungs daily.   VENTOLIN HFA 108 (90 Base) MCG/ACT inhaler Generic drug:  albuterol INHALE 2 PUFFS INTO THE LUNGS EVERY 6 HOURS AS NEEDED FOR WHEEZING ORSHORTNESS OF BREATH        DISCHARGE INSTRUCTIONS:    Follow with GI clinic in 1 to 2 weeks.  If you experience worsening of your admission symptoms, develop shortness of breath, life threatening emergency, suicidal or homicidal thoughts you must seek medical attention immediately by calling 911 or calling your MD immediately  if symptoms less severe.  You Must read complete instructions/literature along with all the possible adverse reactions/side effects for all the Medicines you take and that have been prescribed to you. Take any new Medicines after you have completely understood and accept all the possible adverse reactions/side effects.   Please note  You were cared for by a hospitalist during your hospital stay. If you have any questions about your discharge medications or the care you received while you were in the hospital after you are discharged, you can call the unit and asked to speak with the hospitalist on call if the hospitalist that took care of you is not available. Once you are discharged, your primary care physician will handle any further medical issues. Please note that NO REFILLS for any discharge medications will be authorized once you are discharged, as it is imperative that you return to your primary care physician (or establish a relationship with a primary care physician if  you do not have one) for your aftercare needs so that they can reassess your need for medications and monitor your lab values.    Today   CHIEF COMPLAINT:   Chief Complaint  Patient presents with  . GI Bleeding    HISTORY OF PRESENT ILLNESS:  Christina Hester  is a 77 y.o. female with a known history of recent visits to the ER for nausea vomiting and diarrhea.  She was diagnosed with a virus and sent home.  Her stool studies at that time were negative.  The nausea vomiting had settled down but she has not eaten very much since then.  She is also having bowel movements of bright red blood per rectum.  4 episodes yesterday and 3 today.  Last being at 12:15 PM.  She states the bleeding is quite a bit.  She complains of some abdominal pain described as a gas pain.  Worse when  she has to have a bowel movement.  She is been holding her Plavix for the last 2 days her last dose was on Saturday p.m.  In the ER, she was had a CT scan of the abdomen which showed colitis.  Hospitalist services were contacted for further evaluation.   VITAL SIGNS:  Blood pressure (!) 124/59, pulse 73, temperature 97.7 F (36.5 C), temperature source Oral, resp. rate 18, height 5' (1.524 m), weight 37.1 kg, SpO2 95 %.  I/O:    Intake/Output Summary (Last 24 hours) at 05/23/2018 1559 Last data filed at 05/23/2018 0500 Gross per 24 hour  Intake 240 ml  Output -  Net 240 ml    PHYSICAL EXAMINATION:  GENERAL:  77 y.o.-year-old patient lying in the bed with no acute distress.  EYES: Pupils equal, round, reactive to light and accommodation. No scleral icterus. Extraocular muscles intact.  HEENT: Head atraumatic, normocephalic. Oropharynx and nasopharynx clear.  NECK:  Supple, no jugular venous distention. No thyroid enlargement, no tenderness.  LUNGS: Normal breath sounds bilaterally, no wheezing, rales,rhonchi or crepitation. No use of accessory muscles of respiration.  CARDIOVASCULAR: S1, S2 normal. No murmurs, rubs,  or gallops.  ABDOMEN: Soft, non-tender, non-distended. Bowel sounds present. No organomegaly or mass.  EXTREMITIES: No pedal edema, cyanosis, or clubbing.  NEUROLOGIC: Cranial nerves II through XII are intact. Muscle strength 5/5 in all extremities. Sensation intact. Gait not checked.  PSYCHIATRIC: The patient is alert and oriented x 3.  SKIN: No obvious rash, lesion, or ulcer.   DATA REVIEW:   CBC Recent Labs  Lab 05/23/18 0359  WBC 7.8  HGB 10.1*  HCT 29.6*  PLT 200    Chemistries  Recent Labs  Lab 05/21/18 0823  05/23/18 0359  NA 135   < > 133*  K 3.8   < > 3.6  CL 98   < > 98  CO2 28   < > 27  GLUCOSE 110*   < > 86  BUN 7*   < > 11  CREATININE 0.74   < > 0.70  CALCIUM 9.1   < > 8.7*  AST 23  --   --   ALT 19  --   --   ALKPHOS 86  --   --   BILITOT 0.8  --   --    < > = values in this interval not displayed.    Cardiac Enzymes No results for input(s): TROPONINI in the last 168 hours.  Microbiology Results  Results for orders placed or performed during the hospital encounter of 05/19/18  Gastrointestinal Panel by PCR , Stool     Status: None   Collection Time: 05/19/18 11:47 PM  Result Value Ref Range Status   Campylobacter species NOT DETECTED NOT DETECTED Final   Plesimonas shigelloides NOT DETECTED NOT DETECTED Final   Salmonella species NOT DETECTED NOT DETECTED Final   Yersinia enterocolitica NOT DETECTED NOT DETECTED Final   Vibrio species NOT DETECTED NOT DETECTED Final   Vibrio cholerae NOT DETECTED NOT DETECTED Final   Enteroaggregative E coli (EAEC) NOT DETECTED NOT DETECTED Final   Enteropathogenic E coli (EPEC) NOT DETECTED NOT DETECTED Final   Enterotoxigenic E coli (ETEC) NOT DETECTED NOT DETECTED Final   Shiga like toxin producing E coli (STEC) NOT DETECTED NOT DETECTED Final   Shigella/Enteroinvasive E coli (EIEC) NOT DETECTED NOT DETECTED Final   Cryptosporidium NOT DETECTED NOT DETECTED Final   Cyclospora cayetanensis NOT DETECTED NOT  DETECTED Final   Entamoeba  histolytica NOT DETECTED NOT DETECTED Final   Giardia lamblia NOT DETECTED NOT DETECTED Final   Adenovirus F40/41 NOT DETECTED NOT DETECTED Final   Astrovirus NOT DETECTED NOT DETECTED Final   Norovirus GI/GII NOT DETECTED NOT DETECTED Final   Rotavirus A NOT DETECTED NOT DETECTED Final   Sapovirus (I, II, IV, and V) NOT DETECTED NOT DETECTED Final    Comment: Performed at [facility], [address] [place], [place] [address]  C difficile quick scan w PCR reflex     Status: None   Collection Time: 05/19/18 11:47 PM  Result Value Ref Range Status   C Diff antigen NEGATIVE NEGATIVE Final   C Diff toxin NEGATIVE NEGATIVE Final   C Diff interpretation No C. difficile detected.  Final    Comment: Performed at [facility], [address] [place], [place] [address]    RADIOLOGY:  Nm Gi Blood Loss  Result Date: 05/21/2018 CLINICAL DATA:  77 year old with rectal bleeding. Recent CT suggested colitis. EXAM: NUCLEAR MEDICINE GASTROINTESTINAL BLEEDING SCAN TECHNIQUE: Sequential abdominal images were obtained following intravenous administration of Tc-63m labeled red blood cells. RADIOPHARMACEUTICALS:  23.81 mCi Tc-44m pertechnetate in-vitro labeled red cells. COMPARISON:  Abdominal CT 05/21/2018 FINDINGS: Imaging was performed for 2 hours. There is no convincing evidence for active GI bleeding on this examination. The area of concern on the recent CT is the descending colon. There is faint uptake along the left side of the abdomen but the activity does not move and not convincing for an area of GI bleeding. IMPRESSION: Negative GI bleeding study. Electronically Signed   By: Markus Daft M.D.   On: 05/21/2018 16:37    EKG:   Orders placed or performed during the hospital encounter of 05/21/18  . ED EKG  . ED EKG  . EKG 12-Lead  . EKG 12-Lead      Management plans discussed with the patient, family and they are in agreement.  CODE STATUS:      Code Status Orders  (From admission, onward)         Start     Ordered   05/21/18 1248  Full code  Continuous     05/21/18 1247        Code Status History    Date Active Date Inactive Code Status Order ID Comments User Context   07/14/2017 0930 07/19/2017 1729 Full Code [id number]  Harrie Foreman, MD Inpatient   03/05/2017 1239 03/06/2017 1557 Full Code [id number]  [place], MD Inpatient   07/18/2016 1824 07/20/2016 2041 Full Code [id number]  Vaughan Basta, MD ED   04/28/2016 0156 04/29/2016 1328 Full Code [id number]  [place], Ubaldo Glassing, DO Inpatient   11/25/2015 1933 11/26/2015 1519 Full Code [id number]  Nicholes Mango, MD Inpatient    Advance Directive Documentation     Most Recent Value  Type of Advance Directive  Living will, Healthcare Power of Attorney  Pre-existing out of facility DNR order (yellow form or pink MOST form)  -  "MOST" Form in Place?  -      TOTAL TIME TAKING CARE OF THIS PATIENT: 35 minutes.    Vaughan Basta M.D on 05/23/2018 at 3:59 PM  Between 7am to 6pm - Pager - [phone]  After 6pm go to [email or web address] - password [email or web address] [facility]  Office  [phone]  CC: Primary care physician; Lavera Guise, MD   Note: This dictation was prepared with Dragon dictation along with smaller phrase technology. Any transcriptional errors  that result from this process are unintentional.

## 2018-05-23 NOTE — Progress Notes (Signed)
Christina Darby, MD [address]  [place], [place] [address]  Main: [phone]  Fax: [phone] Pager: [phone]   Subjective: Underwent colonoscopy yesterday.  Patient did not have bowel movement since procedure.  She is tolerating diet well.   Objective: Vital signs in last 24 hours: Vitals:   05/22/18 2238 05/23/18 0416 05/23/18 0715 05/23/18 0811  BP:  (!) 145/60  (!) 124/59  Pulse:  82  73  Resp:  18    Temp:  97.8 F (36.6 C)  97.7 F (36.5 C)  TempSrc:  Oral  Oral  SpO2: 96% 91% 94% 95%  Weight:      Height:       Weight change:   Intake/Output Summary (Last 24 hours) at 05/23/2018 1245 Last data filed at 05/23/2018 0500 Gross per 24 hour  Intake 440 ml  Output -  Net 440 ml     Exam: Heart:: Regular rate and rhythm or S1S2 present Lungs: normal and clear to auscultation Abdomen: soft, nontender, normal bowel sounds   Lab Results: CBC Latest Ref Rng & Units 05/23/2018 05/22/2018 05/21/2018  WBC 4.0 - 10.5 K/uL 7.8 14.4(H) -  Hemoglobin 12.0 - 15.0 g/dL 10.1(L) 10.7(L) 11.4(L)  Hematocrit 36.0 - 46.0 % 29.6(L) 30.7(L) -  Platelets 150 - 400 K/uL 200 175 -   CMP Latest Ref Rng & Units 05/23/2018 05/22/2018 05/21/2018  Glucose 70 - 99 mg/dL 86 92 110(H)  BUN 8 - 23 mg/dL 11 9 7(L)  Creatinine 0.44 - 1.00 mg/dL 0.70 0.62 0.74  Sodium 135 - 145 mmol/L 133(L) 132(L) 135  Potassium 3.5 - 5.1 mmol/L 3.6 3.8 3.8  Chloride 98 - 111 mmol/L 98 97(L) 98  CO2 22 - 32 mmol/L 27 25 28   Calcium 8.9 - 10.3 mg/dL 8.7(L) 8.6(L) 9.1  Total Protein 6.5 - 8.1 g/dL - - 7.2  Total Bilirubin 0.3 - 1.2 mg/dL - - 0.8  Alkaline Phos 38 - 126 U/L - - 86  AST 15 - 41 U/L - - 23  ALT 0 - 44 U/L - - 19    Micro Results: Recent Results (from the past 240 hour(s))  Gastrointestinal Panel by PCR , Stool     Status: None   Collection Time: 05/19/18 11:47 PM  Result Value Ref Range Status   Campylobacter species NOT DETECTED NOT DETECTED Final   Plesimonas shigelloides NOT DETECTED NOT DETECTED Final   Salmonella species NOT DETECTED NOT DETECTED Final   Yersinia enterocolitica NOT DETECTED NOT DETECTED Final   Vibrio species NOT DETECTED NOT DETECTED Final   Vibrio cholerae NOT DETECTED NOT DETECTED Final   Enteroaggregative E coli (EAEC) NOT DETECTED NOT DETECTED Final   Enteropathogenic E coli (EPEC) NOT DETECTED NOT DETECTED Final   Enterotoxigenic E coli (ETEC) NOT DETECTED NOT DETECTED Final   Shiga like toxin producing E coli (STEC) NOT DETECTED NOT DETECTED Final   Shigella/Enteroinvasive E coli (EIEC) NOT DETECTED NOT DETECTED Final   Cryptosporidium NOT DETECTED NOT DETECTED Final   Cyclospora cayetanensis NOT DETECTED NOT DETECTED Final   Entamoeba histolytica NOT DETECTED NOT DETECTED Final   Giardia lamblia NOT DETECTED NOT DETECTED Final   Adenovirus F40/41 NOT DETECTED NOT DETECTED Final   Astrovirus NOT DETECTED NOT DETECTED Final   Norovirus GI/GII NOT DETECTED NOT DETECTED Final   Rotavirus A NOT DETECTED NOT DETECTED Final   Sapovirus (I, II, IV, and V) NOT DETECTED NOT DETECTED Final    Comment: Performed  at [facility], [address] [place], [place] [address]  C difficile quick scan w PCR reflex     Status: None   Collection Time: 05/19/18 11:47 PM  Result Value Ref Range Status   C Diff antigen NEGATIVE NEGATIVE Final   C Diff toxin NEGATIVE NEGATIVE Final   C Diff interpretation No C. difficile detected.  Final    Comment: Performed at [facility], [address] [place], [place] [address]   Studies/Results: Nm Gi Blood Loss  Result Date: 05/21/2018 CLINICAL DATA:  77 year old with rectal bleeding. Recent CT suggested colitis. EXAM: NUCLEAR MEDICINE GASTROINTESTINAL BLEEDING SCAN TECHNIQUE: Sequential abdominal images were obtained following intravenous administration of Tc-74m labeled red blood cells. RADIOPHARMACEUTICALS:  23.81 mCi Tc-40m pertechnetate in-vitro labeled red  cells. COMPARISON:  Abdominal CT 05/21/2018 FINDINGS: Imaging was performed for 2 hours. There is no convincing evidence for active GI bleeding on this examination. The area of concern on the recent CT is the descending colon. There is faint uptake along the left side of the abdomen but the activity does not move and not convincing for an area of GI bleeding. IMPRESSION: Negative GI bleeding study. Electronically Signed   By: Markus Daft M.D.   On: 05/21/2018 16:37   Medications: I have reviewed the patient's current medications. Scheduled Meds: . amLODipine  5 mg Oral QHS  . budesonide (PULMICORT) nebulizer solution  0.25 mg Nebulization BID  . buPROPion  100 mg Oral Daily  . ciprofloxacin  500 mg Oral BID  . ezetimibe  10 mg Oral QODAY  . feeding supplement (ENSURE ENLIVE)  237 mL Oral BID BM  . lisinopril  5 mg Oral Daily  . LORazepam  0.5 mg Oral QHS  . multivitamin with minerals  1 tablet Oral Daily  . tiotropium  1 capsule Inhalation Daily   Continuous Infusions: PRN Meds:.acetaminophen **OR** acetaminophen, ipratropium-albuterol, nitroGLYCERIN, [DISCONTINUED] ondansetron **OR** ondansetron (ZOFRAN) IV, ondansetron   Assessment: Active Problems:   Lower GI bleed  Colonoscopy yesterday revealed ischemic colitis in the rectosigmoid and sigmoid colon, biopsies consistent with ischemic colitis.  Colonoscopy was incomplete due to severe diverticulosis and ischemic colitis, therefore, did not advance colonoscope beyond splenic flexure. Patient is seen by vascular and no indication for any vascular intervention at this time  Plan: Resume Plavix tomorrow Okay to discharge home today from GI standpoint Follow-up with PCP to recheck CBC in 1 month  Patient can be referred to GI as outpatient by her PCP to discuss about screening colonoscopy as she did not have one in her life   LOS: 2 days   Christina Hester 05/23/2018, 12:45 PM

## 2018-05-24 ENCOUNTER — Encounter (INDEPENDENT_AMBULATORY_CARE_PROVIDER_SITE_OTHER): Payer: Medicare Other

## 2018-05-24 ENCOUNTER — Ambulatory Visit (INDEPENDENT_AMBULATORY_CARE_PROVIDER_SITE_OTHER): Payer: Medicare Other | Admitting: Vascular Surgery

## 2018-05-24 ENCOUNTER — Other Ambulatory Visit (INDEPENDENT_AMBULATORY_CARE_PROVIDER_SITE_OTHER): Payer: Medicare Other

## 2018-05-25 ENCOUNTER — Other Ambulatory Visit: Payer: Self-pay | Admitting: Nurse Practitioner

## 2018-05-28 ENCOUNTER — Other Ambulatory Visit: Payer: Self-pay | Admitting: *Deleted

## 2018-05-28 NOTE — Patient Outreach (Addendum)
Christina Hester) Care Management  [id number]  Christina Hester [birth year] [id number]   EMMI-general discharge,  RED ON EMMI ALERT Day # 1 Date: 05/25/18 Saturday 1344 Red Alert Reason: Scheduled follow up? No  First call attempt to pt noted with difficulty in audio CM returned the call Patient is able to verify HIPAA Reviewed and addressed EMMI alert with patient  Christina Hester confirms the EMMI answer was correct. She had not made a follow up appointment with her MD but she informs [name] she can make the appointments herself CM reviewed her d/c instruction listed on EPIC with her  Since she has been home she continues with stomach pain only noted when coughing She denies any bleeding etc  CM encouraged her to call the specialist office      Social: Christina Hester is divorced with assist of her children and brother  She denies concerns with her doing her ADLS, iADLs and getting transportation to medical appointments  Conditions HTN, COPD, hyperlipidemia, anxiety, AAA, PVD, CAD, hx of PNA, lower GI bleed, depression, hyperlipidemia, anxiety,    Medications:denies concerns with taking medications as prescribed, affording medications, side effects of medications and questions about medications  Appointments: She reports having an appointment with Dr Humphrey Rolls already scheduled but not in the next 1-2 weeks  CM encouraged f/u hospital appointment in 1-14 days  She confirms she is awaiting a call from Dr Marius Ditch GI for biopsy results but will call 05/29/18  Gave address and # for Dr Marius Ditch   Advance directives she ha a living will and POA   Consent: [name] reviewed [facility] services with patient. Patient gave verbal consent for services.    Plan [name] RN CM will close case at this time as patient has been assessed and no needs identified. She confirms she can make he own follow up appointment  [name] RN CM sent a letter with educational information on ulcerative colitis in  adults and Ulcerative colitis  d/c instructions  and low fiber diet as discussed with [name] brochure enclosed for review  Pt encouraged to return a call to [name] prn   Christina Hester L. Lavina Hamman, RN, BSN, [place] Coordinator Office number [phone] Mobile number [phone]  Main [facility] number [phone] Fax number [phone]

## 2018-05-30 ENCOUNTER — Telehealth: Payer: Self-pay | Admitting: Gastroenterology

## 2018-05-30 NOTE — Telephone Encounter (Signed)
Pt left vm for Pathology results also wants to know if she needed a F/u apt

## 2018-05-31 NOTE — Telephone Encounter (Signed)
Pt has been notified of pathology results and verbalized understanding

## 2018-06-18 ENCOUNTER — Ambulatory Visit: Payer: Self-pay | Admitting: Internal Medicine

## 2018-07-02 ENCOUNTER — Ambulatory Visit: Payer: Self-pay | Admitting: Internal Medicine

## 2018-07-30 ENCOUNTER — Ambulatory Visit (INDEPENDENT_AMBULATORY_CARE_PROVIDER_SITE_OTHER): Payer: Medicare Other | Admitting: Nurse Practitioner

## 2018-07-30 ENCOUNTER — Encounter: Payer: Self-pay | Admitting: Internal Medicine

## 2018-07-30 VITALS — BP 152/80 | HR 77 | Resp 16 | Ht 60.0 in | Wt 84.0 lb

## 2018-07-30 DIAGNOSIS — I10 Essential (primary) hypertension: Secondary | ICD-10-CM | POA: Diagnosis not present

## 2018-07-30 DIAGNOSIS — I6523 Occlusion and stenosis of bilateral carotid arteries: Secondary | ICD-10-CM

## 2018-07-30 DIAGNOSIS — F411 Generalized anxiety disorder: Secondary | ICD-10-CM | POA: Diagnosis not present

## 2018-07-30 DIAGNOSIS — J439 Emphysema, unspecified: Secondary | ICD-10-CM

## 2018-07-30 DIAGNOSIS — J449 Chronic obstructive pulmonary disease, unspecified: Secondary | ICD-10-CM | POA: Diagnosis not present

## 2018-07-30 MED ORDER — AMLODIPINE BESYLATE 5 MG PO TABS: ORAL_TABLET | ORAL | 3 refills | Status: DC

## 2018-07-30 MED ORDER — LORAZEPAM 0.5 MG PO TABS: ORAL_TABLET | ORAL | 1 refills | Status: DC

## 2018-07-30 MED ORDER — LISINOPRIL 2.5 MG PO TABS: 5.0000 mg | ORAL_TABLET | Freq: Every day | ORAL | 3 refills | Status: DC

## 2018-07-30 NOTE — Progress Notes (Signed)
[facility] [place], [place] [address]  Internal MEDICINE  Office Visit Note  Patient Name: Christina Hester  [id number]  [id number]  Date of Service: 07/30/2018  Chief Complaint  Patient presents with  . Hyperlipidemia  . Hypertension  . COPD  . Quality Metric Gaps    Bone density    The patient is here for routine follow up visit. Blood pressure is generally well controlled. Would like to have 90 day prescriptions sent to her pharmacy. She has seen vein and vascular and had carotid doppler study done. She was told that she had 60-69% occlusion of bilateral carotid arteries. She was told she should have this repeated prior to her next visit with vein and vascular which is 08/20/2018.  She did have episode of viral infection, GI. Had blood in the stool. Was admitted through the ER and had colonoscopy. Inflammation was present. No other acute abnormalities were found. No repeat was planned.       Current Medication: Outpatient Encounter Medications as of 07/30/2018  Medication Sig  . amLODipine (NORVASC) 5 MG tablet Take one tab po qhs  . buPROPion (WELLBUTRIN SR) 100 MG 12 hr tablet TAKE 1 TABLET BY MOUTH DAILY  . clopidogrel (PLAVIX) 75 MG tablet Take 1 tablet (75 mg total) by mouth daily.  . clotrimazole (MYCELEX) 10 MG troche Take 4 to 5 times a  Day as needed fot thrush  . fluticasone (FLOVENT HFA) 110 MCG/ACT inhaler Inhale 2 puffs into the lungs 2 (two) times daily.  Marland Kitchen ipratropium-albuterol (DUONEB) 0.5-2.5 (3) MG/3ML SOLN Take 3 mLs by nebulization every 4 (four) hours.  Marland Kitchen lisinopril (PRINIVIL,ZESTRIL) 2.5 MG tablet Take 2 tablets (5 mg total) by mouth daily.  Marland Kitchen LORazepam (ATIVAN) 0.5 MG tablet TAKE 1/2 TO 1 TABLET BY MOUTH AT NIGHT  . nitroGLYCERIN (NITROSTAT) 0.4 MG SL tablet Place 1 tablet (0.4 mg total) under the tongue every 5 (five) minutes as needed for chest pain.  Marland Kitchen nystatin (MYCOSTATIN) 100000 UNIT/ML suspension Take 5 mLs (500,000 Units  total) by mouth 4 (four) times daily.  . ondansetron (ZOFRAN ODT) 4 MG disintegrating tablet Take 1 tablet (4 mg total) by mouth every 8 (eight) hours as needed for nausea or vomiting.  . Tiotropium Bromide Monohydrate (SPIRIVA RESPIMAT) 2.5 MCG/ACT AERS Inhale 1 puff into the lungs daily.  . VENTOLIN HFA 108 (90 Base) MCG/ACT inhaler INHALE 2 PUFFS INTO THE LUNGS EVERY 6 HOURS AS NEEDED FOR WHEEZING ORSHORTNESS OF BREATH  . [DISCONTINUED] amLODipine (NORVASC) 5 MG tablet Take one tab po qhs  . [DISCONTINUED] lisinopril (PRINIVIL,ZESTRIL) 5 MG tablet Take 1 tablet (5 mg total) by mouth daily.  . [DISCONTINUED] LORazepam (ATIVAN) 0.5 MG tablet TAKE 1/2 TO 1 TABLET BY MOUTH AT NIGHT  . ezetimibe (ZETIA) 10 MG tablet Take 10 mg by mouth every other day.   No facility-administered encounter medications on file as of 07/30/2018.     Surgical History: Past Surgical History:  Procedure Laterality Date  . ABDOMINAL HYSTERECTOMY    . APPENDECTOMY    . BREAST EXCISIONAL BIOPSY Left 80's or 90's   NEG  . COLONOSCOPY N/A 05/22/2018   Procedure: COLONOSCOPY;  Surgeon: Lin Landsman, MD;  Location: [facility];  Service: Gastroenterology;  Laterality: N/A;  . LEFT HEART CATH Bilateral 03/05/2017   Procedure: Left Heart Cath with poss PCI;  Surgeon: Wellington Hampshire, MD;  Location: [facility];  Service: Cardiovascular;  Laterality: Bilateral;    Medical  History: Past Medical History:  Diagnosis Date  . AAA (abdominal aortic aneurysm) ([place])    a. 05/2016 Abd U/S: 3.02 x 3.02 AAA.  Marland Kitchen CAD (coronary artery disease)    a. 02/2017 NSTEMI/Cath: LM nl, LAD mild dzs, D2 min irregs, LCX mild dzs, OM2 50ost, RCA 95p, 155m, L-->R collats, EF 55-65%.  . Cancer ([place])   . COPD (chronic obstructive pulmonary disease) ([place])   . Diastolic dysfunction    a. 02/2017 Echo: EF 55-60%, gr1 DD, ? inf HK, mild MR.  Marland Kitchen Hyperlipidemia   . Hypertension   . PAD (peripheral artery disease) ([place])    a.  05/2016 ABI's: R 1.22, L 1.11.  . Tobacco abuse     Family History: Family History  Problem Relation Age of Onset  . Breast cancer Mother   . Dementia Mother   . Hypertension Mother   . Breast cancer Maternal Aunt   . Breast cancer Maternal Aunt   . Breast cancer Maternal Aunt   . CVA Father     Social History   Socioeconomic History  . Marital status: Divorced    Spouse name: Not on file  . Number of children: Not on file  . Years of education: Not on file  . Highest education level: Not on file  Occupational History  . Not on file  Social Needs  . Financial resource strain: Not on file  . Food insecurity:    Worry: Not on file    Inability: Not on file  . Transportation needs:    Medical: Not on file    Non-medical: Not on file  Tobacco Use  . Smoking status: Former Smoker    Packs/day: 0.25    Last attempt to quit: 07/12/2017    Years since quitting: 1.0  . Smokeless tobacco: Never Used  . Tobacco comment: currently 90 days without smoking  Substance and Sexual Activity  . Alcohol use: No  . Drug use: No  . Sexual activity: Not on file  Lifestyle  . Physical activity:    Days per week: Not on file    Minutes per session: Not on file  . Stress: Not on file  Relationships  . Social connections:    Talks on phone: Not on file    Gets together: Not on file    Attends religious service: Not on file    Active member of club or organization: Not on file    Attends meetings of clubs or organizations: Not on file    Relationship status: Not on file  . Intimate partner violence:    Fear of current or ex partner: Not on file    Emotionally abused: Not on file    Physically abused: Not on file    Forced sexual activity: Not on file  Other Topics Concern  . Not on file  Social History Narrative  . Not on file      Review of Systems  Constitutional: Negative for activity change, chills, fatigue and unexpected weight change.  HENT: Negative for congestion,  postnasal drip, rhinorrhea, sneezing and sore throat.   Respiratory: Negative for cough, chest tightness, shortness of breath and wheezing.   Cardiovascular: Negative for chest pain and palpitations.       Blood pressure slightly elevated today.   Gastrointestinal: Negative for abdominal pain, constipation, diarrhea, nausea and vomiting.  Endocrine: Negative for cold intolerance and polyuria.  Musculoskeletal: Positive for arthralgias. Negative for back pain, joint swelling and neck pain.  Intermittent issues with arthritis. .   Skin: Negative for rash.  Allergic/Immunologic: Negative for environmental allergies.  Neurological: Negative for dizziness, tremors, weakness, numbness and headaches.  Hematological: Negative for adenopathy. Does not bruise/bleed easily.  Psychiatric/Behavioral: Positive for dysphoric mood. Negative for behavioral problems (Depression), sleep disturbance and suicidal ideas. The patient is nervous/anxious.        Well managed on current medication.    Today's Vitals   07/30/18 0927  BP: (!) 152/80  Pulse: 77  Resp: 16  SpO2: 96%  Weight: 84 lb (38.1 kg)  Height: 5' (1.524 m)    Physical Exam Vitals signs and nursing note reviewed.  Constitutional:      General: She is not in acute distress.    Appearance: Normal appearance. She is well-developed. She is not diaphoretic.  HENT:     Head: Normocephalic and atraumatic.     Nose: Nose normal.     Mouth/Throat:     Pharynx: No oropharyngeal exudate.  Eyes:     Pupils: Pupils are equal, round, and reactive to light.  Neck:     Musculoskeletal: Normal range of motion and neck supple.     Thyroid: No thyromegaly.     Vascular: Carotid bruit present. No JVD.     Trachea: No tracheal deviation.     Comments: Very soft carotid bruit auscultated on right side of neck.  Cardiovascular:     Rate and Rhythm: Normal rate and regular rhythm.     Heart sounds: Normal heart sounds. No murmur. No friction rub.  No gallop.   Pulmonary:     Effort: Pulmonary effort is normal. No respiratory distress.     Breath sounds: Normal breath sounds. No wheezing or rales.  Chest:     Chest wall: No tenderness.  Abdominal:     General: Bowel sounds are normal.     Palpations: Abdomen is soft.  Musculoskeletal: Normal range of motion.  Lymphadenopathy:     Cervical: No cervical adenopathy.  Skin:    General: Skin is warm and dry.  Neurological:     General: No focal deficit present.     Mental Status: She is alert and oriented to person, place, and time.     Cranial Nerves: No cranial nerve deficit.  Psychiatric:        Behavior: Behavior normal.        Thought Content: Thought content normal.        Judgment: Judgment normal.   Assessment/Plan: 1. Essential hypertension, benign Generally stable. Continue amlodipine and lisinopril at current doses. Ninety day prescriptions sent for both.  - amLODipine (NORVASC) 5 MG tablet; Take one tab po qhs  Dispense: 90 tablet; Refill: 3 - lisinopril (PRINIVIL,ZESTRIL) 2.5 MG tablet; Take 2 tablets (5 mg total) by mouth daily.  Dispense: 90 tablet; Refill: 3  2. Bilateral carotid artery stenosis Will repeat carotid doppler study. Patient to have follow up with vein and vascular as scheduled.  - US Carotid Duplex Bilateral; Future  3. COPD with chronic bronchitis and emphysema ([place]) Doing well. Continue inhalers as prescribed. Rescue inhalers should be used as needed   4. Generalized anxiety disorder Continue wellbutrin every day. May use lorazepam 0.5mg  at night as needed. A written prescription was given to her today.  - LORazepam (ATIVAN) 0.5 MG tablet; TAKE 1/2 TO 1 TABLET BY MOUTH AT NIGHT  Dispense: 30 tablet; Refill: 1  General Counseling: Maeby verbalizes understanding of the findings of todays visit and agrees with plan  of treatment. I have discussed any further diagnostic evaluation that may be needed or ordered today. We also reviewed her medications  today. she has been encouraged to call the office with any questions or concerns that should arise related to todays visit.  Hypertension Counseling:   The following hypertensive lifestyle modification were recommended and discussed:  1. Limiting alcohol intake to less than 1 oz/day of ethanol:(24 oz of beer or 8 oz of wine or 2 oz of 100-proof whiskey). 2. Take baby ASA 81 mg daily. 3. Importance of regular aerobic exercise and losing weight. 4. Reduce dietary saturated fat and cholesterol intake for overall cardiovascular health. 5. Maintaining adequate dietary potassium, calcium, and magnesium intake. 6. Regular monitoring of the blood pressure. 7. Reduce sodium intake to less than 100 mmol/day (less than 2.3 gm of sodium or less than 6 gm of sodium choride)   This patient was seen by [place] with Dr Lavera Guise as a part of collaborative care agreement  Orders Placed This Encounter  Procedures  . US Carotid Duplex Bilateral    Meds ordered this encounter  Medications  . amLODipine (NORVASC) 5 MG tablet    Sig: Take one tab po qhs    Dispense:  90 tablet    Refill:  3    Order Specific Question:   Supervising Provider    Answer:   Lavera Guise [[id number]]  . lisinopril (PRINIVIL,ZESTRIL) 2.5 MG tablet    Sig: Take 2 tablets (5 mg total) by mouth daily.    Dispense:  90 tablet    Refill:  3    Order Specific Question:   Supervising Provider    Answer:   Lavera Guise [[id number]]  . LORazepam (ATIVAN) 0.5 MG tablet    Sig: TAKE 1/2 TO 1 TABLET BY MOUTH AT NIGHT    Dispense:  30 tablet    Refill:  1    Written rx provided to patient today.    Order Specific Question:   Supervising Provider    Answer:   Lavera Guise [[id number]]    Time spent: 6 Minutes      Dr Lavera Guise Internal medicine

## 2018-08-09 ENCOUNTER — Ambulatory Visit (INDEPENDENT_AMBULATORY_CARE_PROVIDER_SITE_OTHER): Payer: Medicare Other

## 2018-08-09 DIAGNOSIS — I6523 Occlusion and stenosis of bilateral carotid arteries: Secondary | ICD-10-CM

## 2018-08-15 NOTE — Procedures (Signed)
[place], [place] [address]  DATE OF SERVICE: August 09, 2018  CAROTID DOPPLER INTERPRETATION:  Bilateral Carotid Ultrsasound and Color Doppler Examination was performed. The RIGHT CCA shows moderate plaque in the vessel. The LEFT CCA shows moderate plaque in the vessel. There was significant intimal thickening noted in the RIGHT carotid artery. There was significant intimal thickening in the LEFT carotid artery.  The RIGHT CCA shows peak systolic velocity of 90 cm per second. The end diastolic velocity is 18 cm per second on the RIGHT side. The RIGHT ICA shows peak systolic velocity of 585 per second. RIGHT sided ICA end diastolic velocity is 63 cm per second. The RIGHT ECA shows a peak systolic velocity of 277 cm per second. The ICA/CCA ratio is calculated to be 3.61. This suggests greater than 70% occlusion. The Vertebral Artery shows antegrade flow.  The LEFT CCA shows peak systolic velocity of 824 cm per second. The end diastolic velocity is 18 cm per second on the LEFT side. The LEFT ICA shows peak systolic velocity of 235 per second. LEFT sided ICA end diastolic velocity is 37 cm per second. The LEFT ECA shows a peak systolic velocity of 361 cm per second. The ICA/CCA ratio is calculated to be 1.63. This suggests 50 to 69% occlusion. The Vertebral Artery shows antegrade flow.   Impression:    The RIGHT CAROTID shows greater than 70% occlusion. The LEFT CAROTID shows 50 to 69% occlusion.  CT angiography is recommended.  There is moderate plaque formation noted on the LEFT and moderate on the RIGHT  side. Consider a repeat Carotid doppler if clinical situation and symptoms warrant in 6-12 months. Patient should be encouraged to change lifestyles such as smoking cessation, regular exercise and dietary modification. Use of statins in the right clinical setting and ASA is encouraged.  Allyne Gee, MD [facility] Critical Care Medicine

## 2018-08-16 ENCOUNTER — Telehealth: Payer: Self-pay

## 2018-08-16 NOTE — Telephone Encounter (Signed)
Pt advised for carotid doppler showing plague build up on significant hardening of arteries we like to setup for CTA for neck as per Pt she like wait and let her vein and vascular DR review first she had appt with her coming up and result already scan

## 2018-08-19 ENCOUNTER — Other Ambulatory Visit (INDEPENDENT_AMBULATORY_CARE_PROVIDER_SITE_OTHER): Payer: Self-pay | Admitting: Vascular Surgery

## 2018-08-19 DIAGNOSIS — I714 Abdominal aortic aneurysm, without rupture, unspecified: Secondary | ICD-10-CM

## 2018-08-19 DIAGNOSIS — I739 Peripheral vascular disease, unspecified: Secondary | ICD-10-CM

## 2018-08-20 ENCOUNTER — Ambulatory Visit (INDEPENDENT_AMBULATORY_CARE_PROVIDER_SITE_OTHER): Payer: Medicare Other | Admitting: Vascular Surgery

## 2018-08-20 ENCOUNTER — Ambulatory Visit (INDEPENDENT_AMBULATORY_CARE_PROVIDER_SITE_OTHER): Payer: Medicare Other

## 2018-08-20 ENCOUNTER — Encounter (INDEPENDENT_AMBULATORY_CARE_PROVIDER_SITE_OTHER): Payer: Self-pay | Admitting: Vascular Surgery

## 2018-08-20 VITALS — BP 149/71 | HR 81 | Resp 12 | Ht 60.0 in | Wt 83.6 lb

## 2018-08-20 DIAGNOSIS — I6523 Occlusion and stenosis of bilateral carotid arteries: Secondary | ICD-10-CM

## 2018-08-20 DIAGNOSIS — I10 Essential (primary) hypertension: Secondary | ICD-10-CM

## 2018-08-20 DIAGNOSIS — F17211 Nicotine dependence, cigarettes, in remission: Secondary | ICD-10-CM

## 2018-08-20 DIAGNOSIS — I739 Peripheral vascular disease, unspecified: Secondary | ICD-10-CM | POA: Diagnosis not present

## 2018-08-20 DIAGNOSIS — I714 Abdominal aortic aneurysm, without rupture, unspecified: Secondary | ICD-10-CM

## 2018-08-20 NOTE — Assessment & Plan Note (Signed)
Aortic duplex today shows a stable 3.2 cm infrarenal abdominal aortic aneurysm.  Below the threshold for prophylactic repair.  Continue abstinence from tobacco and blood pressure control.  Recheck in 1 year

## 2018-08-20 NOTE — Assessment & Plan Note (Signed)
This was recently seen on a duplex at an outside facility.  Her right carotid artery velocities are now at least in the borderline of the high-grade range and by their velocity criteria were in the greater than 70% range.  These velocities were similar but slightly higher than they were towards the end of 2018.  Her left carotid velocities are in the 50 to 69% range by their velocity criteria. At this time, we discussed options.  He would certainly be reasonable to go ahead and get a CT angiogram for further evaluation.  She will continue her aspirin and Plavix.  The patient does not want to get a CT scan at this time saying she has gotten over multiple issues in the past few months.  She would prefer a shortened interval follow-up which is reasonable given her asymptomatic state and borderline high-grade lesion.  I have discussed the pathophysiology and natural history of carotid disease.  We will see the patient back in follow-up with carotid duplex in 6 months.

## 2018-08-20 NOTE — Patient Instructions (Signed)
Carotid Artery Disease  The carotid arteries are arteries on both sides of the neck. They carry blood to the brain, face, and neck. Carotid artery disease happens when these arteries become smaller (narrow) or get blocked. If these arteries become smaller or get blocked, you are more likely to have a stroke or a warning stroke (transient ischemic attack). Follow these instructions at home:  Take over-the-counter and prescription medicines only as told by your doctor.  Make sure you understand all instructions about your medicines. Do not stop taking your medicines without talking to your doctor first.  Follow your doctor's diet instructions. It is important to follow a healthy diet. ? Eat foods that include plenty of: ? Fresh fruits. ? Vegetables. ? Lean meats. ? Avoid these foods: ? Foods that are high in fat. ? Foods that are high in salt (sodium). ? Foods that are fried. ? Foods that are processed. ? Foods that have few good nutrients (poor nutritional value).  Keep a healthy weight.  Stay active. Get at least 30 minutes of activity every day.  Do not smoke.  Limit alcohol use to: ? No more than 2 drinks a day for men. ? No more than 1 drink a day for women who are not pregnant.  Do not use illegal drugs.  Keep all follow-up visits as told by your doctor. This is important. Contact a doctor if: Get help right away if:  You have any symptoms of stroke or TIA. The acronym BEFAST is an easy way to remember the main warning signs of stroke. ? B = Balance problems. Signs include dizziness, sudden trouble walking, or loss of balance ? E = Eye problems. This includes trouble seeing or a sudden change in vision. ? F = Face changes. This includes sudden weakness or numbness of the face, or the face or eyelid drooping to one side. ? A = Arm weakness or numbness. This happens suddenly and usually on one side of the body. ? S = Speech problems. This includes trouble speaking or  trouble understanding. ? T = Time. Time to call 911 or seek emergency care. Do not wait to see if symptoms go away. Make note of the time your symptoms started.  Other signs of stroke may include: ? A sudden, severe headache with no known cause. ? Feeling sick to your stomach (nauseous) or throwing up (vomiting). ? Seizure. Call your local emergency services (911 in [place] Do notdrive yourself to the clinic or hospital. Summary  The carotid arteries are arteries on both sides of the neck.  If these arteries get smaller or get blocked, you are more likely to have a stroke or a warning stroke (transient ischemic attack).  Take over-the-counter and prescription medicines only as told by your doctor.  Keep all follow-up visits as told by your doctor. This is important. This information is not intended to replace advice given to you by your health care provider. Make sure you discuss any questions you have with your health care provider. Document Released: 06/19/2012 Document Revised: 06/28/2017 Document Reviewed: 06/28/2017 Elsevier Interactive Patient Education  2019 [employer]  

## 2018-08-20 NOTE — Assessment & Plan Note (Signed)
Mild reduction in ABIs from previous studies but no limb threatening or lifestyle limiting symptoms.  At this point, continue current medical regimen.  Recheck in 1 year.

## 2018-08-20 NOTE — Progress Notes (Signed)
MRN : [id number]  Christina Hester is a 78 y.o. (07/07/1941) female who presents with chief complaint of  Chief Complaint  Patient presents with  . Follow-up  .  History of Present Illness: Patient returns today in follow up of multiple vascular issues.  She was actually seen in the hospital as consultation by her team a few months ago with ischemic colitis.  Her colitis symptoms have resolved and her abdominal pain is basically gone at this point.  She has a known abdominal aortic aneurysm.  No current aneurysm related symptoms. Specifically, the patient denies new back or abdominal pain, or signs of peripheral embolization.  Duplex today shows her aneurysm to be of stable size at approximately 3.2 cm in maximal diameter. She is also studied with ABIs today.  Her ABIs have dropped a little although she denies any lifestyle limiting claudication, ischemic rest pain, or ulceration.  Her right ABI 0.93 and her left ABI is 0.75.  Previously, these were 1.0 and 0.84 respectively. Another vascular issue was addressed today.  The patient has known carotid artery disease.  This was recently seen on a duplex at an outside facility.  Her right carotid artery velocities are now at least in the borderline of the high-grade range and by their velocity criteria were in the greater than 70% range.  These velocities were similar but slightly higher than they were towards the end of 2018.  Her left carotid velocities are in the 50 to 69% range by their velocity criteria.  No focal neurologic symptoms. Specifically, the patient denies amaurosis fugax, speech or swallowing difficulties, or arm or leg weakness or numbness   Current Outpatient Medications  Medication Sig Dispense Refill  . amLODipine (NORVASC) 5 MG tablet Take one tab po qhs 90 tablet 3  . buPROPion (WELLBUTRIN SR) 100 MG 12 hr tablet TAKE 1 TABLET BY MOUTH DAILY 90 tablet 1  . clopidogrel (PLAVIX) 75 MG tablet Take 1 tablet (75 mg total) by mouth  daily. 90 tablet 3  . clotrimazole (MYCELEX) 10 MG troche Take 4 to 5 times a  Day as needed fot thrush 70 tablet 0  . fluticasone (FLOVENT HFA) 110 MCG/ACT inhaler Inhale 2 puffs into the lungs 2 (two) times daily. 1 Inhaler 12  . ipratropium-albuterol (DUONEB) 0.5-2.5 (3) MG/3ML SOLN Take 3 mLs by nebulization every 4 (four) hours. 360 mL 3  . lisinopril (PRINIVIL,ZESTRIL) 2.5 MG tablet Take 2 tablets (5 mg total) by mouth daily. 90 tablet 3  . LORazepam (ATIVAN) 0.5 MG tablet TAKE 1/2 TO 1 TABLET BY MOUTH AT NIGHT 30 tablet 1  . nitroGLYCERIN (NITROSTAT) 0.4 MG SL tablet Place 1 tablet (0.4 mg total) under the tongue every 5 (five) minutes as needed for chest pain. 20 tablet 12  . nystatin (MYCOSTATIN) 100000 UNIT/ML suspension Take 5 mLs (500,000 Units total) by mouth 4 (four) times daily. 180 mL 1  . ondansetron (ZOFRAN ODT) 4 MG disintegrating tablet Take 1 tablet (4 mg total) by mouth every 8 (eight) hours as needed for nausea or vomiting. 20 tablet 0  . Tiotropium Bromide Monohydrate (SPIRIVA RESPIMAT) 2.5 MCG/ACT AERS Inhale 1 puff into the lungs daily. 30 Inhaler 5  . VENTOLIN HFA 108 (90 Base) MCG/ACT inhaler INHALE 2 PUFFS INTO THE LUNGS EVERY 6 HOURS AS NEEDED FOR WHEEZING ORSHORTNESS OF BREATH 18 g 3   No current facility-administered medications for this visit.     Past Medical History:  Diagnosis Date  . AAA (  abdominal aortic aneurysm) ([place])    a. 05/2016 Abd U/S: 3.02 x 3.02 AAA.  Marland Kitchen CAD (coronary artery disease)    a. 02/2017 NSTEMI/Cath: LM nl, LAD mild dzs, D2 min irregs, LCX mild dzs, OM2 50ost, RCA 95p, 167m L-->R collats, EF 55-65%.  . Cancer ([place]   . COPD (chronic obstructive [place] disease) [place]   . Diastolic dysfunction    a. 02/2017 Echo: EF 55-60%, gr1 DD, ? inf HK, mild MR.  .Marland KitchenHyperlipidemia   . Hypertension   . PAD (peripheral artery disease) ([place]    a. 05/2016 ABI's: R 1.22, L 1.11.  . Tobacco abuse     Past Surgical History:  Procedure Laterality  Date  . ABDOMINAL HYSTERECTOMY    . APPENDECTOMY    . BREAST EXCISIONAL BIOPSY Left 80's or 90's   NEG  . COLONOSCOPY N/A 05/22/2018   Procedure: COLONOSCOPY;  Surgeon: VLin Landsman MD;  [facility];  Service: Gastroenterology;  Laterality: N/A;  . LEFT HEART CATH Bilateral 03/05/2017   Procedure: Left Heart Cath with poss PCI;  Surgeon: AWellington Hampshire MD;  [facility];  Service: Cardiovascular;  Laterality: Bilateral;    Social History Social History   Tobacco Use  . Smoking status: Former Smoker    Packs/day: 0.25    Last attempt to quit: 07/12/2017    Years since quitting: 1.1  . Smokeless tobacco: Never Used  . Tobacco comment: currently 90 days without smoking  Substance Use Topics  . Alcohol use: No  . Drug use: No    Family History Family History  Problem Relation Age of Onset  . Breast cancer Mother   . Dementia Mother   . Hypertension Mother   . Breast cancer Maternal Aunt   . Breast cancer Maternal Aunt   . Breast cancer Maternal Aunt   . CVA Father     Allergies  Allergen Reactions  . Aspirin Tinitus  . Prednisone Other (See Comments)    Makes violent  . Sulfa Antibiotics Nausea And Vomiting  . Symbicort [Budesonide-Formoterol Fumarate] Other (See Comments)    Patient felt "out of her mind" and angry.     REVIEW OF SYSTEMS (Negative unless checked)  Constitutional: '[]'$ Weight loss  '[]'$ Fever  '[]'$ Chills Cardiac: '[]'$ Chest pain   '[]'$ Chest pressure   '[]'$ Palpitations   '[]'$ Shortness of breath when laying flat   '[]'$ Shortness of breath at rest   '[x]'$ Shortness of breath with exertion. Vascular:  '[]'$ Pain in legs with walking   '[]'$ Pain in legs at rest   '[]'$ Pain in legs when laying flat   '[]'$ Claudication   '[]'$ Pain in feet when walking  '[]'$ Pain in feet at rest  '[]'$ Pain in feet when laying flat   '[]'$ History of DVT   '[]'$ Phlebitis   '[]'$ Swelling in legs   '[]'$ Varicose veins   '[]'$ Non-healing ulcers Pulmonary:   '[]'$ Uses home oxygen   '[]'$ Productive cough    '[]'$ Hemoptysis   '[]'$ Wheeze  '[x]'$ COPD   '[]'$ Asthma Neurologic:  '[]'$ Dizziness  '[]'$ Blackouts   '[]'$ Seizures   '[]'$ History of stroke   '[]'$ History of TIA  '[]'$ Aphasia   '[]'$ Temporary blindness   '[]'$ Dysphagia   '[]'$ Weakness or numbness in arms   '[]'$ Weakness or numbness in legs Musculoskeletal:  '[x]'$ Arthritis   '[]'$ Joint swelling   '[]'$ Joint pain   '[]'$ Low back pain Hematologic:  '[]'$ Easy bruising  '[]'$ Easy bleeding   '[]'$ Hypercoagulable state   '[]'$ Anemic   Gastrointestinal:  '[]'$ Blood in stool   '[]'$ Vomiting blood  '[]'$ Gastroesophageal reflux/heartburn   '[x]'$ Abdominal pain Genitourinary:  '[]'$   Chronic kidney disease   '[]'$ Difficult urination  '[]'$ Frequent urination  '[]'$ Burning with urination   '[]'$ Hematuria Skin:  '[]'$ Rashes   '[]'$ Ulcers   '[]'$ Wounds Psychological:  '[]'$ History of anxiety   '[]'$  History of major depression.  Physical Examination  BP (!) 149/71 (BP Location: Right Arm, Patient Position: Sitting)   Pulse 81   Resp 12   Ht 5' (1.524 m)   Wt 83 lb 9.6 oz (37.9 kg)   BMI 16.33 kg/m  Gen:  WD/WN, NAD Head: [place]/AT, No temporalis wasting. Ear/Nose/Throat: Hearing grossly intact, nares w/o erythema or drainage Eyes: Conjunctiva clear. Sclera non-icteric Neck: Supple.  Trachea midline Pulmonary:  Good air movement, no use of accessory muscles.  Cardiac: RRR, no JVD Vascular:  Vessel Right Left  Radial Palpable Palpable                          PT Palpable 1+ Palpable  DP Palpable 1+ Palpable   Gastrointestinal: soft, non-tender/non-distended. Mildly increased aortic impulse Musculoskeletal: M/S 5/5 throughout.  No deformity or atrophy. No edema. Neurologic: Sensation grossly intact in extremities.  Symmetrical.  Speech is fluent.  Psychiatric: Judgment intact, Mood & affect appropriate for pt's clinical situation. Dermatologic: No rashes or ulcers noted.  No cellulitis or open wounds.       Labs Recent Results (from the past 2160 hour(s))  Surgical pathology     Status: None   Collection Time: 05/22/18 12:54 PM  Result Value  Ref Range   SURGICAL PATHOLOGY      Surgical Pathology CASE: [id number] PATIENT: Burley Saver Surgical Pathology Report     SPECIMEN SUBMITTED: A. Colon, left, r/o ischemic colitis; cbx  CLINICAL HISTORY: None provided  PRE-OPERATIVE DIAGNOSIS: Hematochezia  POST-OPERATIVE DIAGNOSIS: Diverticulosis     DIAGNOSIS: A. COLON, LEFT; COLD BIOPSY: - ISCHEMIC COLITIS. - NEGATIVE FOR VIRAL CYTOPATHIC EFFECT, DYSPLASIA, AND MALIGNANCY.  GROSS DESCRIPTION: A. Labeled: Cbx left colon to rule out ischemic colitis Received: In formalin Tissue fragment(s): 3 Size: 0.1-0.2 cm Description: Pink-tan fragments Entirely submitted in one cassette.    Final Diagnosis performed by Quay Burow, MD.   Electronically signed 05/23/2018 8:42:38AM The electronic signature indicates that the named Attending Pathologist has evaluated the specimen  Technical component performed at [facility], [address], [place], [place] [address] Lab: [phone] Dir: Rush Farmer, MD, MMM  Professional component perfo rmed at [facility], [facility], [place], [place], [place] [address] Lab: [phone] Dir: Dellia Nims. Reuel Derby, MD   Ferritin     Status: None   Collection Time: 05/22/18  4:31 PM  Result Value Ref Range   Ferritin 113 11 - 307 ng/mL    Comment: Performed at [facility], [address] [place], [place] [address]  Iron and TIBC     Status: Abnormal   Collection Time: 05/22/18  4:31 PM  Result Value Ref Range   Iron 32 28 - 170 ug/dL   TIBC 229 (L) 250 - 450 ug/dL   Saturation Ratios 14 10.4 - 31.8 %   UIBC 197 ug/dL    Comment: Performed at [facility], [address] [place], [place] [address]  Vitamin B12     Status: None   Collection Time: 05/22/18  4:31 PM  Result Value Ref Range   Vitamin B-12 297 180 - 914 pg/mL    Comment: (NOTE) This assay is not validated for testing neonatal or myeloproliferative syndrome specimens for  Vitamin B12 levels. Performed at  [facility], [place] [address] [place], [place] [address]   Folate     Status: None   Collection Time: 05/22/18  4:31 PM  Result Value Ref Range   Folate 20.7 >5.9 ng/mL    Comment: Performed at [facility], [address] [place], [place] [address]  CBC     Status: Abnormal   Collection Time: 05/23/18  3:59 AM  Result Value Ref Range   WBC 7.8 4.0 - 10.5 K/uL   RBC 3.26 (L) 3.87 - 5.11 MIL/uL   Hemoglobin 10.1 (L) 12.0 - 15.0 g/dL   HCT 29.6 (L) 36.0 - 46.0 %   MCV 90.8 80.0 - 100.0 fL   MCH 31.0 26.0 - 34.0 pg   MCHC 34.1 30.0 - 36.0 g/dL   RDW 12.7 11.5 - 15.5 %   Platelets 200 150 - 400 K/uL   nRBC 0.0 0.0 - 0.2 %    Comment: Performed at [facility], [address] [place], [place] [address]  Basic metabolic panel     Status: Abnormal   Collection Time: 05/23/18  3:59 AM  Result Value Ref Range   Sodium 133 (L) 135 - 145 mmol/L   Potassium 3.6 3.5 - 5.1 mmol/L   Chloride 98 98 - 111 mmol/L   CO2 27 22 - 32 mmol/L   Glucose, Bld 86 70 - 99 mg/dL   BUN 11 8 - 23 mg/dL   Creatinine, Ser 0.70 0.44 - 1.00 mg/dL   Calcium 8.7 (L) 8.9 - 10.3 mg/dL   GFR calc non Af Amer >60 >60 mL/min   GFR calc Af Amer >60 >60 mL/min    Comment: (NOTE) The eGFR has been calculated using the CKD EPI equation. This calculation has not been validated in all clinical situations. eGFR's persistently <60 mL/min signify possible Chronic Kidney Disease.    Anion gap 8 5 - 15    Comment: Performed at [facility], [address] [place], [place] [address]    Radiology US Carotid Duplex Bilateral  Result Date: 08/09/2018 Allyne Gee, MD     08/15/2018  9:21 PM [place], [place] [address] DATE OF SERVICE: August 09, 2018 CAROTID DOPPLER INTERPRETATION: Bilateral Carotid Ultrsasound and Color Doppler Examination was performed. The RIGHT CCA shows moderate plaque in the vessel. The LEFT  CCA shows moderate plaque in the vessel. There was significant intimal thickening noted in the RIGHT carotid artery. There was significant intimal thickening in the LEFT carotid artery. The RIGHT CCA shows peak systolic velocity of 90 cm per second. The end diastolic velocity is 18 cm per second on the RIGHT side. The RIGHT ICA shows peak systolic velocity of 130 per second. RIGHT sided ICA end diastolic velocity is 63 cm per second. The RIGHT ECA shows a peak systolic velocity of 865 cm per second. The ICA/CCA ratio is calculated to be 3.61. This suggests greater than 70% occlusion. The Vertebral Artery shows antegrade flow. The LEFT CCA shows peak systolic velocity of 784 cm per second. The end diastolic velocity is 18 cm per second on the LEFT side. The LEFT ICA shows peak systolic velocity of 696 per second. LEFT sided ICA end diastolic velocity is 37 cm per second. The LEFT ECA shows a peak systolic velocity of 295 cm per second. The ICA/CCA ratio is calculated to be 1.63. This suggests 50 to 69% occlusion. The Vertebral Artery shows antegrade flow. Impression:  The RIGHT CAROTID  shows greater than 70% occlusion. The LEFT CAROTID shows 50 to 69% occlusion.  CT angiography is recommended.  There is moderate plaque formation noted on the LEFT and moderate on the RIGHT  side. Consider a repeat Carotid doppler if clinical situation and symptoms warrant in 6-12 months. Patient should be encouraged to change lifestyles such as smoking cessation, regular exercise and dietary modification. Use of statins in the right clinical setting and ASA is encouraged. Allyne Gee, MD [facility] Pulmonary Critical Care Medicine    Assessment/Plan  Essential hypertension, benign blood pressure control important in reducing the progression of atherosclerotic disease. On appropriate oral medications.   PVD (peripheral vascular disease) (HCC) Mild reduction in ABIs from previous studies but no limb threatening or lifestyle  limiting symptoms.  At this point, continue current medical regimen.  Recheck in 1 year.  AAA (abdominal aortic aneurysm) without rupture (HCC) Aortic duplex today shows a stable 3.2 cm infrarenal abdominal aortic aneurysm.  Below the threshold for prophylactic repair.  Continue abstinence from tobacco and blood pressure control.  Recheck in 1 year  Carotid stenosis  This was recently seen on a duplex at an outside facility.  Her right carotid artery velocities are now at least in the borderline of the high-grade range and by their velocity criteria were in the greater than 70% range.  These velocities were similar but slightly higher than they were towards the end of 2018.  Her left carotid velocities are in the 50 to 69% range by their velocity criteria. At this time, we discussed options.  He would certainly be reasonable to go ahead and get a CT angiogram for further evaluation.  She will continue her aspirin and Plavix.  The patient does not want to get a CT scan at this time saying she has gotten over multiple issues in the past few months.  She would prefer a shortened interval follow-up which is reasonable given her asymptomatic state and borderline high-grade lesion.  I have discussed the pathophysiology and natural history of carotid disease.  We will see the patient back in follow-up with carotid duplex in 6 months.    Leotis Pain, MD  08/20/2018 10:08 AM    This note was created with Dragon medical transcription system.  Any errors from dictation are purely unintentional

## 2018-08-20 NOTE — Assessment & Plan Note (Signed)
blood pressure control important in reducing the progression of atherosclerotic disease. On appropriate oral medications.  

## 2018-08-26 ENCOUNTER — Other Ambulatory Visit: Payer: Self-pay | Admitting: Internal Medicine

## 2018-09-06 NOTE — Telephone Encounter (Signed)
DISCUSSED WITH HEATHER AND PT WANTS TO SEE VEIN AND VASCULAR PROVIDER AND PLANS TO DO SO.

## 2018-11-01 ENCOUNTER — Other Ambulatory Visit: Payer: Self-pay

## 2018-11-01 ENCOUNTER — Ambulatory Visit (INDEPENDENT_AMBULATORY_CARE_PROVIDER_SITE_OTHER): Payer: Medicare Other | Admitting: Adult Health

## 2018-11-01 ENCOUNTER — Encounter: Payer: Self-pay | Admitting: Adult Health

## 2018-11-01 VITALS — BP 124/68 | HR 91 | Temp 96.6°F | Resp 16 | Ht 60.0 in | Wt 83.0 lb

## 2018-11-01 DIAGNOSIS — J988 Other specified respiratory disorders: Secondary | ICD-10-CM

## 2018-11-01 DIAGNOSIS — I10 Essential (primary) hypertension: Secondary | ICD-10-CM

## 2018-11-01 MED ORDER — AZITHROMYCIN 250 MG PO TABS: ORAL_TABLET | ORAL | 0 refills | Status: DC

## 2018-11-01 NOTE — Patient Instructions (Signed)

## 2018-11-01 NOTE — Progress Notes (Signed)
[facility] [place], [place] [address]  Internal MEDICINE  Telephone Visit  Patient Name: Christina Hester  [id number]  [id number]  Date of Service: 11/01/2018  I connected with the patient at 1120 by telephone and verified the patients identity using two identifiers.  I discussed the limitations, risks, security and privacy concerns of performing an evaluation and management service by telephone and the availability of in person appointments. I also discussed with the patient that there may be a patient responsible charge related to the service.  The patient expressed understanding and agrees to proceed.    Chief Complaint  Patient presents with  . Telephone Screen  . Sinusitis    USING INHALERS MORE OFTEN , DENIES FEVER AND BODYACHES   . Cough  . Telephone Assessment    HPI  PT reports sob, and using her inhalers more often.  She reports she has also been coughing.  She denies fever or body aches at this time. She has also been having allergy issues.  She reports PND, and sneezing with rhinorrhea.     Current Medication: Outpatient Encounter Medications as of 11/01/2018  Medication Sig  . amLODipine (NORVASC) 5 MG tablet Take one tab po qhs  . buPROPion (WELLBUTRIN SR) 100 MG 12 hr tablet TAKE 1 TABLET BY MOUTH ONCE DAILY  . clopidogrel (PLAVIX) 75 MG tablet Take 1 tablet (75 mg total) by mouth daily.  . clotrimazole (MYCELEX) 10 MG troche Take 4 to 5 times a  Day as needed fot thrush  . fluticasone (FLOVENT HFA) 110 MCG/ACT inhaler Inhale 2 puffs into the lungs 2 (two) times daily.  Marland Kitchen ipratropium-albuterol (DUONEB) 0.5-2.5 (3) MG/3ML SOLN Take 3 mLs by nebulization every 4 (four) hours.  Marland Kitchen lisinopril (PRINIVIL,ZESTRIL) 2.5 MG tablet Take 2 tablets (5 mg total) by mouth daily.  Marland Kitchen LORazepam (ATIVAN) 0.5 MG tablet TAKE 1/2 TO 1 TABLET BY MOUTH AT NIGHT  . nitroGLYCERIN (NITROSTAT) 0.4 MG SL tablet Place 1 tablet (0.4 mg total) under the tongue every 5  (five) minutes as needed for chest pain.  Marland Kitchen nystatin (MYCOSTATIN) 100000 UNIT/ML suspension Take 5 mLs (500,000 Units total) by mouth 4 (four) times daily.  . ondansetron (ZOFRAN ODT) 4 MG disintegrating tablet Take 1 tablet (4 mg total) by mouth every 8 (eight) hours as needed for nausea or vomiting.  . Tiotropium Bromide Monohydrate (SPIRIVA RESPIMAT) 2.5 MCG/ACT AERS Inhale 1 puff into the lungs daily.  . VENTOLIN HFA 108 (90 Base) MCG/ACT inhaler INHALE 2 PUFFS INTO THE LUNGS EVERY 6 HOURS AS NEEDED FOR WHEEZING ORSHORTNESS OF BREATH  . azithromycin (ZITHROMAX) 250 MG tablet Take as directed.   No facility-administered encounter medications on file as of 11/01/2018.     Surgical History: Past Surgical History:  Procedure Laterality Date  . ABDOMINAL HYSTERECTOMY    . APPENDECTOMY    . BREAST EXCISIONAL BIOPSY Left 80's or 90's   NEG  . COLONOSCOPY N/A 05/22/2018   Procedure: COLONOSCOPY;  Surgeon: Lin Landsman, MD;  [facility];  Service: Gastroenterology;  Laterality: N/A;  . LEFT HEART CATH Bilateral 03/05/2017   Procedure: Left Heart Cath with poss PCI;  Surgeon: Wellington Hampshire, MD;  Location: [place] [facility];  Service: Cardiovascular;  Laterality: Bilateral;    Medical History: Past Medical History:  Diagnosis Date  . AAA (abdominal aortic aneurysm) ([place])    a. 05/2016 Abd U/S: 3.02 x 3.02 AAA.  Marland Kitchen CAD (coronary artery disease)  a. 02/2017 NSTEMI/Cath: LM nl, LAD mild dzs, D2 min irregs, LCX mild dzs, OM2 50ost, RCA 95p, 114m, L-->R collats, EF 55-65%.  . Cancer ([place])   . COPD (chronic obstructive pulmonary disease) ([place])   . Diastolic dysfunction    a. 02/2017 Echo: EF 55-60%, gr1 DD, ? inf HK, mild MR.  Marland Kitchen Hyperlipidemia   . Hypertension   . PAD (peripheral artery disease) ([place])    a. 05/2016 ABI's: R 1.22, L 1.11.  . Tobacco abuse     Family History: Family History  Problem Relation Age of Onset  . Breast cancer Mother   . Dementia Mother    . Hypertension Mother   . Breast cancer Maternal Aunt   . Breast cancer Maternal Aunt   . Breast cancer Maternal Aunt   . CVA Father     Social History   Socioeconomic History  . Marital status: Divorced    Spouse name: Not on file  . Number of children: Not on file  . Years of education: Not on file  . Highest education level: Not on file  Occupational History  . Not on file  Social Needs  . Financial resource strain: Not on file  . Food insecurity:    Worry: Not on file    Inability: Not on file  . Transportation needs:    Medical: Not on file    Non-medical: Not on file  Tobacco Use  . Smoking status: Former Smoker    Packs/day: 0.25    Last attempt to quit: 07/12/2017    Years since quitting: 1.3  . Smokeless tobacco: Never Used  . Tobacco comment: currently 90 days without smoking  Substance and Sexual Activity  . Alcohol use: No  . Drug use: No  . Sexual activity: Not on file  Lifestyle  . Physical activity:    Days per week: Not on file    Minutes per session: Not on file  . Stress: Not on file  Relationships  . Social connections:    Talks on phone: Not on file    Gets together: Not on file    Attends religious service: Not on file    Active member of club or organization: Not on file    Attends meetings of clubs or organizations: Not on file    Relationship status: Not on file  . Intimate partner violence:    Fear of current or ex partner: Not on file    Emotionally abused: Not on file    Physically abused: Not on file    Forced sexual activity: Not on file  Other Topics Concern  . Not on file  Social History Narrative  . Not on file     Review of Systems  Constitutional: Negative for chills, fatigue and unexpected weight change.  HENT: Positive for postnasal drip and rhinorrhea. Negative for congestion, sneezing and sore throat.   Eyes: Negative for photophobia, pain and redness.  Respiratory: Positive for cough and shortness of breath.  Negative for chest tightness.   Cardiovascular: Negative for chest pain and palpitations.  Gastrointestinal: Negative for abdominal pain, constipation, diarrhea, nausea and vomiting.  Endocrine: Negative.   Genitourinary: Negative for dysuria and frequency.  Musculoskeletal: Negative for arthralgias, back pain, joint swelling and neck pain.  Skin: Negative for rash.  Allergic/Immunologic: Negative.   Neurological: Negative for tremors and numbness.  Hematological: Negative for adenopathy. Does not bruise/bleed easily.  Psychiatric/Behavioral: Negative for behavioral problems and sleep disturbance. The patient is not nervous/anxious.  Vital Signs: BP 124/68   Pulse 91   Temp (!) 96.6 F (35.9 C)   Resp 16   Ht 5' (1.524 m)   Wt 83 lb (37.6 kg)   BMI 16.21 kg/m    Observation/Objective: Speaking clearly, in full sentences. No SOB noted.    Assessment/Plan: 1. Respiratory infection Advised patient to take entire course of antibiotics as prescribed with food. Pt should return to clinic in 7-10 days if symptoms fail to improve or new symptoms develop. Continue nebulizer treatements and using rescue inhaler.  - azithromycin (ZITHROMAX) 250 MG tablet; Take as directed.  Dispense: 6 tablet; Refill: 0  2. Essential hypertension, benign Stable, on recheck.   General Counseling: Rashidah verbalizes understanding of the findings of today's phone visit and agrees with plan of treatment. I have discussed any further diagnostic evaluation that may be needed or ordered today. We also reviewed her medications today. she has been encouraged to call the office with any questions or concerns that should arise related to todays visit.    No orders of the defined types were placed in this encounter.   Meds ordered this encounter  Medications  . azithromycin (ZITHROMAX) 250 MG tablet    Sig: Take as directed.    Dispense:  6 tablet    Refill:  0    Time spent: [place] AGNP-C Internal medicine

## 2018-11-18 DIAGNOSIS — C44521 Squamous cell carcinoma of skin of breast: Secondary | ICD-10-CM | POA: Diagnosis not present

## 2018-11-18 DIAGNOSIS — L820 Inflamed seborrheic keratosis: Secondary | ICD-10-CM | POA: Diagnosis not present

## 2018-11-18 DIAGNOSIS — D485 Neoplasm of uncertain behavior of skin: Secondary | ICD-10-CM | POA: Diagnosis not present

## 2018-11-18 DIAGNOSIS — L821 Other seborrheic keratosis: Secondary | ICD-10-CM | POA: Diagnosis not present

## 2018-12-02 DIAGNOSIS — L821 Other seborrheic keratosis: Secondary | ICD-10-CM | POA: Diagnosis not present

## 2018-12-02 DIAGNOSIS — Z8589 Personal history of malignant neoplasm of other organs and systems: Secondary | ICD-10-CM

## 2018-12-02 DIAGNOSIS — C44521 Squamous cell carcinoma of skin of breast: Secondary | ICD-10-CM | POA: Diagnosis not present

## 2018-12-02 HISTORY — DX: Personal history of malignant neoplasm of other organs and systems: Z85.89

## 2018-12-10 ENCOUNTER — Telehealth: Payer: Self-pay

## 2018-12-10 NOTE — Telephone Encounter (Signed)
Pt called for samples of spiriva, left two samples at front desk for pt to pick up.

## 2018-12-13 ENCOUNTER — Other Ambulatory Visit: Payer: Self-pay | Admitting: Internal Medicine

## 2018-12-24 DIAGNOSIS — H1132 Conjunctival hemorrhage, left eye: Secondary | ICD-10-CM | POA: Diagnosis not present

## 2018-12-31 ENCOUNTER — Encounter: Payer: Self-pay | Admitting: Internal Medicine

## 2019-01-06 ENCOUNTER — Ambulatory Visit (INDEPENDENT_AMBULATORY_CARE_PROVIDER_SITE_OTHER): Payer: Medicare Other | Admitting: Nurse Practitioner

## 2019-01-06 ENCOUNTER — Encounter: Payer: Self-pay | Admitting: Nurse Practitioner

## 2019-01-06 ENCOUNTER — Other Ambulatory Visit: Payer: Self-pay

## 2019-01-06 VITALS — BP 144/76 | HR 72 | Resp 16 | Ht 60.0 in | Wt 84.6 lb

## 2019-01-06 DIAGNOSIS — Z0001 Encounter for general adult medical examination with abnormal findings: Secondary | ICD-10-CM

## 2019-01-06 DIAGNOSIS — I6523 Occlusion and stenosis of bilateral carotid arteries: Secondary | ICD-10-CM

## 2019-01-06 DIAGNOSIS — I2583 Coronary atherosclerosis due to lipid rich plaque: Secondary | ICD-10-CM | POA: Diagnosis not present

## 2019-01-06 DIAGNOSIS — F411 Generalized anxiety disorder: Secondary | ICD-10-CM

## 2019-01-06 DIAGNOSIS — I2510 Atherosclerotic heart disease of native coronary artery without angina pectoris: Secondary | ICD-10-CM

## 2019-01-06 DIAGNOSIS — J449 Chronic obstructive pulmonary disease, unspecified: Secondary | ICD-10-CM | POA: Diagnosis not present

## 2019-01-06 MED ORDER — LORAZEPAM 0.5 MG PO TABS: ORAL_TABLET | ORAL | 2 refills | Status: DC

## 2019-01-06 MED ORDER — IPRATROPIUM-ALBUTEROL 0.5-2.5 (3) MG/3ML IN SOLN: 3.0000 mL | RESPIRATORY_TRACT | 3 refills | Status: DC

## 2019-01-06 NOTE — Progress Notes (Signed)
[facility] [place], [place] [address]  Internal MEDICINE  Office Visit Note  Patient Name: Christina Hester  [id number]  [id number]  Date of Service: 01/08/2019   Pt is here for routine health maintenance examination   Chief Complaint  Patient presents with  . Medicare Wellness    pt have some concerns about her back, medication refills  . Hypertension  . Hyperlipidemia     The patient is here for health maintenance exam. She is concerned about some back pain. This occurs when she is standing up for longer periods of time, whether that is cooking or washing dishes. She states that she has to sit and rest in order to improve the pain. She also had two days of lower blood pressures. This happened on two occassions when she took the second blood pressure medication a bit late, mixing with the one she takes in the morning. She states that she has been having to use her nebulizer more often. States that her daughter smokes. She admits to "sneaking" one or two cigarettes recently. She continues to follow up with [facility]. She is due to have next carotid doppler done on August 7. They also manage stent placement in her legs and abdominal aneurysm. There have been no worsening of these conditions. They are monitored yearly. She had a skin cancer lesion on the right flake area. Has a band aid on this area for now. She is doe to follow up with dermatologist in about 6 months.   Current Medication: Outpatient Encounter Medications as of 01/06/2019  Medication Sig  . amLODipine (NORVASC) 5 MG tablet Take one tab po qhs  . azithromycin (ZITHROMAX) 250 MG tablet Take as directed.  Marland Kitchen buPROPion (WELLBUTRIN SR) 100 MG 12 hr tablet TAKE 1 TABLET BY MOUTH ONCE DAILY  . clopidogrel (PLAVIX) 75 MG tablet Take 1 tablet (75 mg total) by mouth daily.  . clotrimazole (MYCELEX) 10 MG troche DISSOLVE 1 TABLET BY MOUTH 5 TIMES DAILY  . fluticasone (FLOVENT HFA) 110  MCG/ACT inhaler Inhale 2 puffs into the lungs 2 (two) times daily.  Marland Kitchen ipratropium-albuterol (DUONEB) 0.5-2.5 (3) MG/3ML SOLN Take 3 mLs by nebulization every 4 (four) hours.  Marland Kitchen lisinopril (PRINIVIL,ZESTRIL) 2.5 MG tablet Take 2 tablets (5 mg total) by mouth daily.  Marland Kitchen LORazepam (ATIVAN) 0.5 MG tablet TAKE 1/2 TO 1 TABLET BY MOUTH AT NIGHT  . nitroGLYCERIN (NITROSTAT) 0.4 MG SL tablet Place 1 tablet (0.4 mg total) under the tongue every 5 (five) minutes as needed for chest pain.  Marland Kitchen nystatin (MYCOSTATIN) 100000 UNIT/ML suspension Take 5 mLs (500,000 Units total) by mouth 4 (four) times daily.  . ondansetron (ZOFRAN ODT) 4 MG disintegrating tablet Take 1 tablet (4 mg total) by mouth every 8 (eight) hours as needed for nausea or vomiting.  . Tiotropium Bromide Monohydrate (SPIRIVA RESPIMAT) 2.5 MCG/ACT AERS Inhale 1 puff into the lungs daily.  . VENTOLIN HFA 108 (90 Base) MCG/ACT inhaler INHALE 2 PUFFS INTO THE LUNGS EVERY 6 HOURS AS NEEDED FOR WHEEZING ORSHORTNESS OF BREATH  . [DISCONTINUED] ipratropium-albuterol (DUONEB) 0.5-2.5 (3) MG/3ML SOLN Take 3 mLs by nebulization every 4 (four) hours.  . [DISCONTINUED] LORazepam (ATIVAN) 0.5 MG tablet TAKE 1/2 TO 1 TABLET BY MOUTH AT NIGHT   No facility-administered encounter medications on file as of 01/06/2019.     Surgical History: Past Surgical History:  Procedure Laterality Date  . ABDOMINAL HYSTERECTOMY    . APPENDECTOMY    .  BREAST EXCISIONAL BIOPSY Left 80's or 90's   NEG  . COLONOSCOPY N/A 05/22/2018   Procedure: COLONOSCOPY;  Surgeon: Lin Landsman, MD;  Location: [facility] ENDOSCOPY;  Service: Gastroenterology;  Laterality: N/A;  . LEFT HEART CATH Bilateral 03/05/2017   Procedure: Left Heart Cath with poss PCI;  Surgeon: Wellington Hampshire, MD;  Location: [place] [facility];  Service: Cardiovascular;  Laterality: Bilateral;    Medical History: Past Medical History:  Diagnosis Date  . AAA (abdominal aortic aneurysm) ([place])    a. 05/2016  Abd U/S: 3.02 x 3.02 AAA.  Marland Kitchen CAD (coronary artery disease)    a. 02/2017 NSTEMI/Cath: LM nl, LAD mild dzs, D2 min irregs, LCX mild dzs, OM2 50ost, RCA 95p, 175m, L-->R collats, EF 55-65%.  . Cancer ([place])   . COPD (chronic obstructive pulmonary disease) ([place])   . Diastolic dysfunction    a. 02/2017 Echo: EF 55-60%, gr1 DD, ? inf HK, mild MR.  Marland Kitchen Hx of basal cell carcinoma    back   . 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 Hyperlipidemia   . Hypertension   . PAD (peripheral artery disease) ([place])    a. 05/2016 ABI's: R 1.22, L 1.11.  . Tobacco abuse     Family History: Family History  Problem Relation Age of Onset  . Breast cancer Mother   . Dementia Mother   . Hypertension Mother   . Breast cancer Maternal Aunt   . Breast cancer Maternal Aunt   . Breast cancer Maternal Aunt   . CVA Father       Review of Systems  Constitutional: Negative for activity change, chills, fatigue and unexpected weight change.  HENT: Negative for congestion, postnasal drip, rhinorrhea, sneezing and sore throat.   Respiratory: Positive for shortness of breath. Negative for cough, chest tightness and wheezing.        Shortness of breath with exertion.   Cardiovascular: Negative for chest pain and palpitations.  Gastrointestinal: Negative for abdominal pain, constipation, diarrhea, nausea and vomiting.  Endocrine: Negative for cold intolerance, heat intolerance, polydipsia and polyuria.  Musculoskeletal: Positive for arthralgias and back pain. Negative for joint swelling and neck pain.       Intermittent issues with arthritis. .   Skin: Negative for rash.  Allergic/Immunologic: Negative for environmental allergies.  Neurological: Negative for dizziness, tremors, weakness, numbness and headaches.  Hematological: Negative for adenopathy. Does not bruise/bleed easily.  Psychiatric/Behavioral: Positive for dysphoric mood. Negative for behavioral problems (Depression), sleep  disturbance and suicidal ideas. The patient is nervous/anxious.        Well managed on current medication.     Today's Vitals   01/06/19 1054  BP: (!) 144/76  Pulse: 72  Resp: 16  SpO2: 96%  Weight: 84 lb 9.6 oz (38.4 kg)  Height: 5' (1.524 m)   Body mass index is 16.52 kg/m.  Physical Exam Vitals signs and nursing note reviewed.  Constitutional:      General: She is not in acute distress.    Appearance: Normal appearance. She is well-developed. She is not diaphoretic.  HENT:     Head: Normocephalic and atraumatic.     Nose: Nose normal.     Mouth/Throat:     Pharynx: No oropharyngeal exudate.  Eyes:     Pupils: Pupils are equal, round, and reactive to light.  Neck:     Musculoskeletal: Normal range of motion and neck supple.     Thyroid: No  thyromegaly.     Vascular: Carotid bruit present. No JVD.     Trachea: No tracheal deviation.     Comments: Very soft carotid bruit auscultated on right side of neck.  Cardiovascular:     Rate and Rhythm: Normal rate and regular rhythm.     Pulses: Normal pulses.     Heart sounds: Murmur present. No friction rub. No gallop.   Pulmonary:     Effort: Pulmonary effort is normal. No respiratory distress.     Breath sounds: Normal breath sounds. No wheezing or rales.  Chest:     Chest wall: No tenderness.     Breasts:        Right: Normal. No swelling, bleeding, inverted nipple, mass, nipple discharge, skin change or tenderness.        Left: Normal. No swelling, bleeding, inverted nipple, mass, nipple discharge, skin change or tenderness.  Abdominal:     General: Bowel sounds are normal.     Palpations: Abdomen is soft.     Tenderness: There is no abdominal tenderness.  Musculoskeletal: Normal range of motion.  Lymphadenopathy:     Cervical: No cervical adenopathy.  Skin:    General: Skin is warm and dry.     Capillary Refill: Capillary refill takes 2 to 3 seconds.  Neurological:     General: No focal deficit present.      Mental Status: She is alert and oriented to person, place, and time.     Cranial Nerves: No cranial nerve deficit.  Psychiatric:        Behavior: Behavior normal.        Thought Content: Thought content normal.        Judgment: Judgment normal.    Depression screen [facility] 2/9 01/06/2019 11/01/2018 05/28/2018 03/04/2018 12/27/2017  Decreased Interest 0 0 0 0 0  Down, Depressed, Hopeless 0 0 0 0 0  PHQ - 2 Score 0 0 0 0 0    Functional Status Survey: Is the patient deaf or have difficulty hearing?: No Does the patient have difficulty seeing, even when wearing glasses/contacts?: No Does the patient have difficulty concentrating, remembering, or making decisions?: No Does the patient have difficulty walking or climbing stairs?: No Does the patient have difficulty dressing or bathing?: No Does the patient have difficulty doing errands alone such as visiting a doctor's office or shopping?: No  MMSE - [place] Exam 01/06/2019 12/27/2017  Orientation to time 5 5  Orientation to Place 5 5  Registration 3 3  Attention/ Calculation 5 5  Recall 3 3  Language- name 2 objects 2 2  Language- repeat 1 1  Language- follow 3 step command 3 3  Language- read & follow direction 1 1  Write a sentence 1 1  Copy design 1 1  Total score 30 30    Fall Risk  01/06/2019 11/01/2018 03/04/2018 12/27/2017 09/04/2017  Falls in the past year? 0 0 No No Yes  Number falls in past yr: - - - - 1  Injury with Fall? - - - - No    Assessment/Plan: 1. Encounter for general adult medical examination with abnormal findings Annual health maintenance exam today.   2. COPD with chronic bronchitis and emphysema ([place]) Continue all inhalers and respiratory medications as prescribed. Follow up with Dr. Devona Konig as scheduled.  - ipratropium-albuterol (DUONEB) 0.5-2.5 (3) MG/3ML SOLN; Take 3 mLs by nebulization every 4 (four) hours.  Dispense: 360 mL; Refill: 3  3. Generalized anxiety disorder May  continue lorazepam  0.5mg , taking 1/2 to 1 tablet at bedtime as needed for anxiety/insomnia.  - LORazepam (ATIVAN) 0.5 MG tablet; TAKE 1/2 TO 1 TABLET BY MOUTH AT NIGHT  Dispense: 30 tablet; Refill: 2  4. Coronary artery disease due to lipid rich plaque Regular visits with cardiology as scheduled.   5. Bilateral carotid artery stenosis Scheduled to see vein and vascular early next month.   General Counseling: Lauramae verbalizes understanding of the findings of todays visit and agrees with plan of treatment. I have discussed any further diagnostic evaluation that may be needed or ordered today. We also reviewed her medications today. she has been encouraged to call the office with any questions or concerns that should arise related to todays visit.    Counseling:  This patient was seen by [place] with Dr Lavera Guise as a part of collaborative care agreement  Meds ordered this encounter  Medications  . ipratropium-albuterol (DUONEB) 0.5-2.5 (3) MG/3ML SOLN    Sig: Take 3 mLs by nebulization every 4 (four) hours.    Dispense:  360 mL    Refill:  3    Order Specific Question:   Supervising Provider    Answer:   Lavera Guise [[id number]]  . LORazepam (ATIVAN) 0.5 MG tablet    Sig: TAKE 1/2 TO 1 TABLET BY MOUTH AT NIGHT    Dispense:  30 tablet    Refill:  2    Written rx provided to patient today.    Order Specific Question:   Supervising Provider    Answer:   Lavera Guise [[id number]]    Time spent: [place], MD  Internal Medicine

## 2019-01-06 NOTE — Progress Notes (Signed)
Pt blood pressure is elevated,  Taken twice 1st reading 154/70 2nd reading 144/76 Informed provider

## 2019-01-08 DIAGNOSIS — Z0001 Encounter for general adult medical examination with abnormal findings: Secondary | ICD-10-CM | POA: Insufficient documentation

## 2019-02-21 ENCOUNTER — Ambulatory Visit (INDEPENDENT_AMBULATORY_CARE_PROVIDER_SITE_OTHER): Payer: Medicare Other | Admitting: Vascular Surgery

## 2019-02-21 ENCOUNTER — Ambulatory Visit (INDEPENDENT_AMBULATORY_CARE_PROVIDER_SITE_OTHER): Payer: Medicare Other

## 2019-02-21 ENCOUNTER — Encounter (INDEPENDENT_AMBULATORY_CARE_PROVIDER_SITE_OTHER): Payer: Self-pay | Admitting: Vascular Surgery

## 2019-02-21 ENCOUNTER — Other Ambulatory Visit: Payer: Self-pay

## 2019-02-21 VITALS — BP 182/78 | HR 88 | Resp 16 | Ht 60.0 in | Wt 83.0 lb

## 2019-02-21 DIAGNOSIS — I6523 Occlusion and stenosis of bilateral carotid arteries: Secondary | ICD-10-CM

## 2019-02-21 DIAGNOSIS — I714 Abdominal aortic aneurysm, without rupture, unspecified: Secondary | ICD-10-CM

## 2019-02-21 DIAGNOSIS — I739 Peripheral vascular disease, unspecified: Secondary | ICD-10-CM

## 2019-02-21 DIAGNOSIS — I10 Essential (primary) hypertension: Secondary | ICD-10-CM | POA: Diagnosis not present

## 2019-02-21 NOTE — Assessment & Plan Note (Signed)
blood pressure control important in reducing the progression of atherosclerotic disease. On appropriate oral medications.  

## 2019-02-21 NOTE — Assessment & Plan Note (Signed)
No limb threatening symptoms.  To be checked at her next visit.

## 2019-02-21 NOTE — Assessment & Plan Note (Signed)
No aneurysm related symptoms.  To be checked at her next visit

## 2019-02-21 NOTE — Progress Notes (Signed)
MRN : [id number]  Christina Hester is a 78 y.o. (10-20-1940) female who presents with chief complaint of  Chief Complaint  Patient presents with  . Follow-up    ultrasound follow up  .  History of Present Illness: Patient returns in follow-up of her carotid disease.  She is doing well without any focal neurologic symptoms. Specifically, the patient denies amaurosis fugax, speech or swallowing difficulties, or arm or leg weakness or numbness.  No specific complaints today.  Her carotid duplex shows stable 60 to 79% right ICA stenosis and stable 40 to 59% left ICA stenosis without significant progression from previous studies.   Current Outpatient Medications  Medication Sig Dispense Refill  . amLODipine (NORVASC) 5 MG tablet Take one tab po qhs 90 tablet 3  . buPROPion (WELLBUTRIN SR) 100 MG 12 hr tablet TAKE 1 TABLET BY MOUTH ONCE DAILY 90 tablet 1  . clopidogrel (PLAVIX) 75 MG tablet Take 1 tablet (75 mg total) by mouth daily. 90 tablet 3  . clotrimazole (MYCELEX) 10 MG troche DISSOLVE 1 TABLET BY MOUTH 5 TIMES DAILY 70 tablet 0  . fluticasone (FLOVENT HFA) 110 MCG/ACT inhaler Inhale 2 puffs into the lungs 2 (two) times daily. 1 Inhaler 12  . ipratropium-albuterol (DUONEB) 0.5-2.5 (3) MG/3ML SOLN Take 3 mLs by nebulization every 4 (four) hours. 360 mL 3  . LORazepam (ATIVAN) 0.5 MG tablet TAKE 1/2 TO 1 TABLET BY MOUTH AT NIGHT 30 tablet 2  . nitroGLYCERIN (NITROSTAT) 0.4 MG SL tablet Place 1 tablet (0.4 mg total) under the tongue every 5 (five) minutes as needed for chest pain. 20 tablet 12  . nystatin (MYCOSTATIN) 100000 UNIT/ML suspension Take 5 mLs (500,000 Units total) by mouth 4 (four) times daily. 180 mL 1  . Tiotropium Bromide Monohydrate (SPIRIVA RESPIMAT) 2.5 MCG/ACT AERS Inhale 1 puff into the lungs daily. 30 Inhaler 5  . VENTOLIN HFA 108 (90 Base) MCG/ACT inhaler INHALE 2 PUFFS INTO THE LUNGS EVERY 6 HOURS AS NEEDED FOR WHEEZING ORSHORTNESS OF BREATH 18 g 3  . azithromycin  (ZITHROMAX) 250 MG tablet Take as directed. (Patient not taking: Reported on 02/21/2019) 6 tablet 0  . lisinopril (PRINIVIL,ZESTRIL) 2.5 MG tablet Take 2 tablets (5 mg total) by mouth daily. (Patient not taking: Reported on 02/21/2019) 90 tablet 3  . ondansetron (ZOFRAN ODT) 4 MG disintegrating tablet Take 1 tablet (4 mg total) by mouth every 8 (eight) hours as needed for nausea or vomiting. (Patient not taking: Reported on 02/21/2019) 20 tablet 0   No current facility-administered medications for this visit.     Past Medical History:  Diagnosis Date  . AAA (abdominal aortic aneurysm) ([place])    a. 05/2016 Abd U/S: 3.02 x 3.02 AAA.  Marland Kitchen CAD (coronary artery disease)    a. 02/2017 NSTEMI/Cath: LM nl, LAD mild dzs, D2 min irregs, LCX mild dzs, OM2 50ost, RCA 95p, 156m, L-->R collats, EF 55-65%.  . Cancer ([place])   . COPD (chronic obstructive pulmonary disease) ([place])   . Diastolic dysfunction    a. 02/2017 Echo: EF 55-60%, gr1 DD, ? inf HK, mild MR.  Marland Kitchen Hx of basal cell carcinoma    back   . 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 Hyperlipidemia   . Hypertension   . PAD (peripheral artery disease) ([place])    a. 05/2016 ABI's: R 1.22, L 1.11.  . Tobacco abuse     Past Surgical History:  Procedure Laterality Date  .  ABDOMINAL HYSTERECTOMY    . APPENDECTOMY    . BREAST EXCISIONAL BIOPSY Left 80's or 90's   NEG  . COLONOSCOPY N/A 05/22/2018   Procedure: COLONOSCOPY;  Surgeon: Lin Landsman, MD;  Location: [facility] ENDOSCOPY;  Service: Gastroenterology;  Laterality: N/A;  . LEFT HEART CATH Bilateral 03/05/2017   Procedure: Left Heart Cath with poss PCI;  Surgeon: Wellington Hampshire, MD;  Location: [place] [facility] LAB;  Service: Cardiovascular;  Laterality: Bilateral;   Social History        Tobacco Use  . Smoking status: Former Smoker    Packs/day: 0.25    Last attempt to quit: 07/12/2017    Years since quitting: 1.1  . Smokeless tobacco: Never Used  .  Tobacco comment: currently 90 days without smoking  Substance Use Topics  . Alcohol use: No  . Drug use: No    Family History      Family History  Problem Relation Age of Onset  . Breast cancer Mother   . Dementia Mother   . Hypertension Mother   . Breast cancer Maternal Aunt   . Breast cancer Maternal Aunt   . Breast cancer Maternal Aunt   . CVA Father          Allergies  Allergen Reactions  . Aspirin Tinitus  . Prednisone Other (See Comments)    Makes violent  . Sulfa Antibiotics Nausea And Vomiting  . Symbicort [Budesonide-Formoterol Fumarate] Other (See Comments)    Patient felt "out of her mind" and angry.     REVIEW OF SYSTEMS (Negative unless checked)  Constitutional: [] ?Weight loss  [] ?Fever  [] ?Chills Cardiac: [] ?Chest pain   [] ?Chest pressure   [] ?Palpitations   [] ?Shortness of breath when laying flat   [] ?Shortness of breath at rest   [x] ?Shortness of breath with exertion. Vascular:  [] ?Pain in legs with walking   [] ?Pain in legs at rest   [] ?Pain in legs when laying flat   [] ?Claudication   [] ?Pain in feet when walking  [] ?Pain in feet at rest  [] ?Pain in feet when laying flat   [] ?History of DVT   [] ?Phlebitis   [] ?Swelling in legs   [] ?Varicose veins   [] ?Non-healing ulcers Pulmonary:   [] ?Uses home oxygen   [] ?Productive cough   [] ?Hemoptysis   [] ?Wheeze  [x] ?COPD   [] ?Asthma Neurologic:  [] ?Dizziness  [] ?Blackouts   [] ?Seizures   [] ?History of stroke   [] ?History of TIA  [] ?Aphasia   [] ?Temporary blindness   [] ?Dysphagia   [] ?Weakness or numbness in arms   [] ?Weakness or numbness in legs Musculoskeletal:  [x] ?Arthritis   [] ?Joint swelling   [] ?Joint pain   [] ?Low back pain Hematologic:  [] ?Easy bruising  [] ?Easy bleeding   [] ?Hypercoagulable state   [] ?Anemic   Gastrointestinal:  [] ?Blood in stool   [] ?Vomiting blood  [] ?Gastroesophageal reflux/heartburn   [x] ?Abdominal pain Genitourinary:  [] ?Chronic kidney disease   [] ?Difficult urination   [] ?Frequent urination  [] ?Burning with urination   [] ?Hematuria Skin:  [] ?Rashes   [] ?Ulcers   [] ?Wounds Psychological:  [] ?History of anxiety   [] ? History of major depression.    Physical Examination  Vitals:   02/21/19 0930  BP: (!) 182/78  Pulse: 88  Resp: 16  Weight: 83 lb (37.6 kg)  Height: 5' (1.524 m)   Body mass index is 16.21 kg/m. Gen:  WD/WN, NAD Head: [place]/AT, No temporalis wasting. Ear/Nose/Throat: Hearing grossly intact, nares w/o erythema or drainage, trachea midline Eyes: Conjunctiva clear. Sclera non-icteric Neck: Supple.  Right carotid bruit  Pulmonary:  Good air movement, equal and clear to auscultation bilaterally.  Cardiac: RRR, No JVD Vascular:  Vessel Right Left  Radial Palpable Palpable               Musculoskeletal: M/S 5/5 throughout.  No deformity or atrophy. No edema. Neurologic: CN 2-12 intact. Sensation grossly intact in extremities.  Symmetrical.  Speech is fluent. Motor exam as listed above. Psychiatric: Judgment intact, Mood & affect appropriate for pt's clinical situation. Dermatologic: No rashes or ulcers noted.  No cellulitis or open wounds.      CBC Lab Results  Component Value Date   WBC 7.8 05/23/2018   HGB 10.1 (L) 05/23/2018   HCT 29.6 (L) 05/23/2018   MCV 90.8 05/23/2018   PLT 200 05/23/2018    BMET    Component Value Date/Time   NA 133 (L) 05/23/2018 0359   NA 135 01/07/2018 0858   NA 139 04/28/2014 1649   K 3.6 05/23/2018 0359   K 3.5 04/28/2014 1649   CL 98 05/23/2018 0359   CL 100 04/28/2014 1649   CO2 27 05/23/2018 0359   CO2 30 04/28/2014 1649   GLUCOSE 86 05/23/2018 0359   GLUCOSE 94 04/28/2014 1649   BUN 11 05/23/2018 0359   BUN 12 01/07/2018 0858   BUN 12 04/28/2014 1649   CREATININE 0.70 05/23/2018 0359   CREATININE 0.64 04/28/2014 1649   CALCIUM 8.7 (L) 05/23/2018 0359   CALCIUM 9.2 04/28/2014 1649   GFRNONAA >60 05/23/2018 0359   GFRNONAA >60 04/28/2014 1649   GFRAA >60 05/23/2018 0359    GFRAA >60 04/28/2014 1649   CrCl cannot be calculated (Patient's most recent lab result is older than the maximum 21 days allowed.).  COAG Lab Results  Component Value Date   INR 1.03 05/21/2018   INR 1.13 03/03/2017   INR 1.04 11/26/2015    Radiology No results found.   Assessment/Plan PVD (peripheral vascular disease) ([place]) No limb threatening symptoms.  To be checked at her next visit.  Essential hypertension, benign blood pressure control important in reducing the progression of atherosclerotic disease. On appropriate oral medications.   AAA (abdominal aortic aneurysm) without rupture ([facility]) No aneurysm related symptoms.  To be checked at her next visit  Carotid stenosis Her carotid duplex shows stable 60 to 79% right ICA stenosis and stable 40 to 59% left ICA stenosis without significant progression from previous studies.  Continue current medical regimen.  Discussed continued close follow-up on 80-month intervals and if she progresses into a high-grade range would likely need carotid endarterectomy.    Leotis Pain, MD  02/21/2019 10:26 AM    This note was created with Dragon medical transcription system.  Any errors from dictation are purely unintentional

## 2019-02-21 NOTE — Assessment & Plan Note (Signed)
Her carotid duplex shows stable 60 to 79% right ICA stenosis and stable 40 to 59% left ICA stenosis without significant progression from previous studies.  Continue current medical regimen.  Discussed continued close follow-up on 92-month intervals and if she progresses into a high-grade range would likely need carotid endarterectomy.

## 2019-02-27 ENCOUNTER — Other Ambulatory Visit: Payer: Self-pay | Admitting: Internal Medicine

## 2019-04-16 IMAGING — CT CT ANGIO CHEST
2 of 6 series · 18 of 46 positions shown · IV contrast (iopamidol)
Comparison: 03/02/2017

CLINICAL DATA: Acute hypoxic respiratory failure

EXAM:
CT ANGIOGRAPHY CHEST WITH CONTRAST
TECHNIQUE: Multidetector CT imaging of the chest was performed using the
standard protocol during bolus administration of intravenous
contrast. Multiplanar CT image reconstructions and MIPs were
obtained to evaluate the vascular anatomy.
CONTRAST:  <See Chart> A22KYO-1DI IOPAMIDOL (A22KYO-1DI) INJECTION
76%

[Series 5: thins · axial · 0.61mm/px · z∈[-328,-54]mm · 15 of 302 slices shown]
[im 14/302  lung]
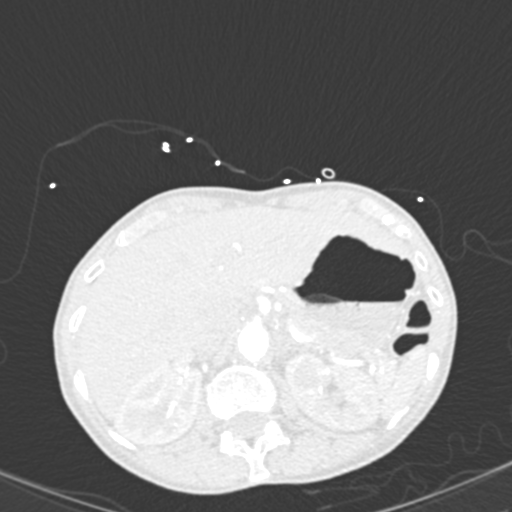
[im 40/302  soft-tissue]
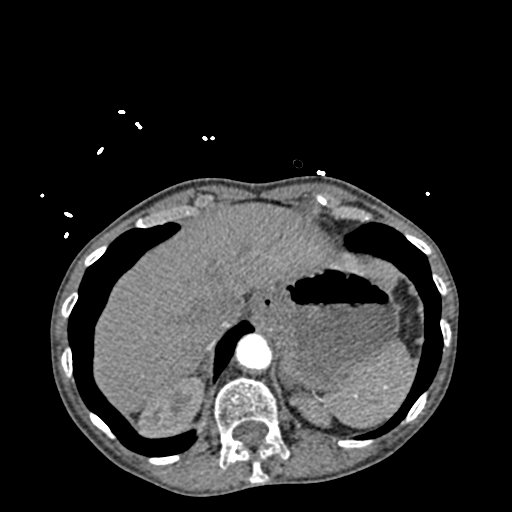
[im 53/302  lung]
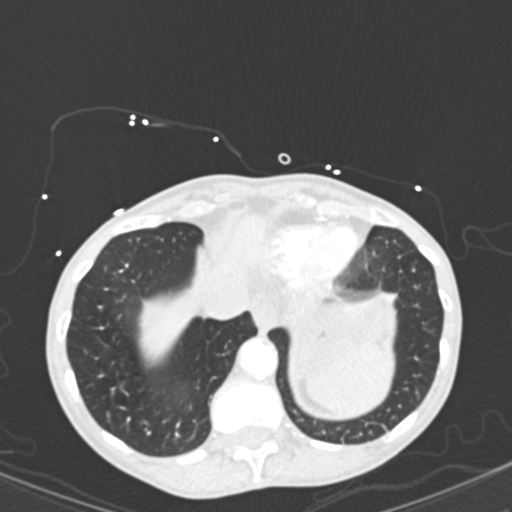
[im 79/302  soft-tissue]
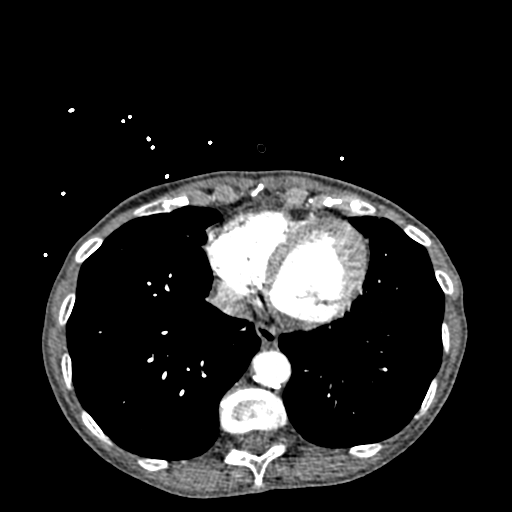
[im 92/302  lung]
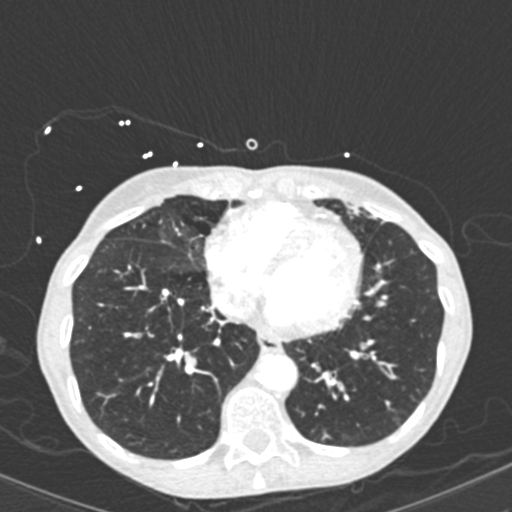
[im 118/302  soft-tissue]
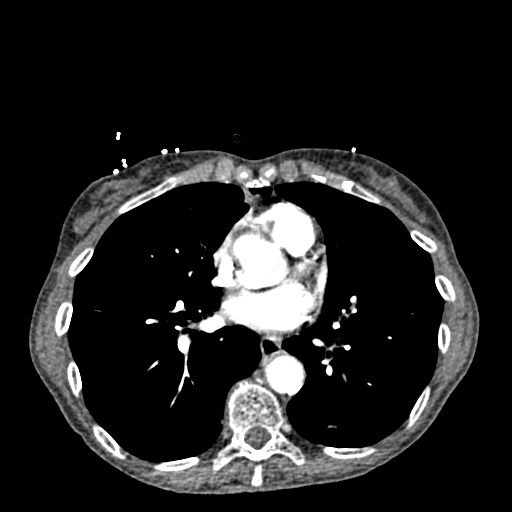
[im 131/302  lung]
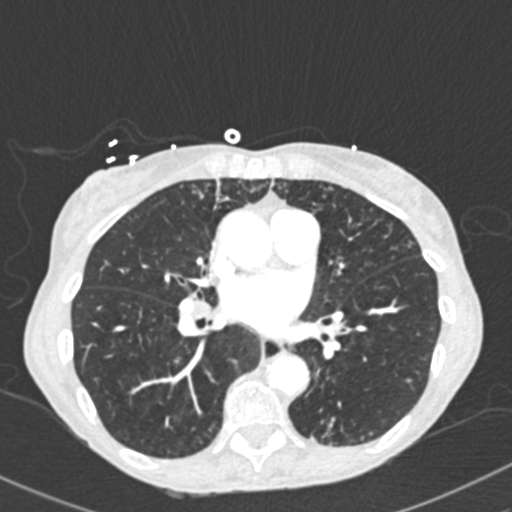
[im 158/302  soft-tissue]
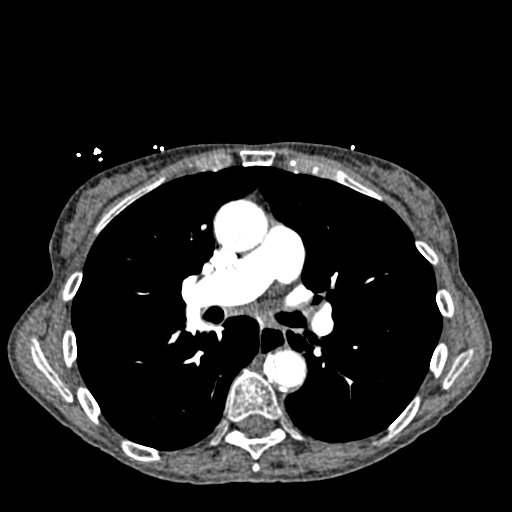
[im 171/302  lung]
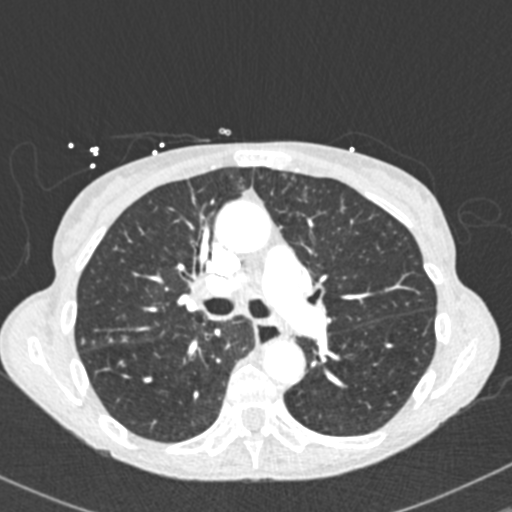
[im 184/302  soft-tissue]
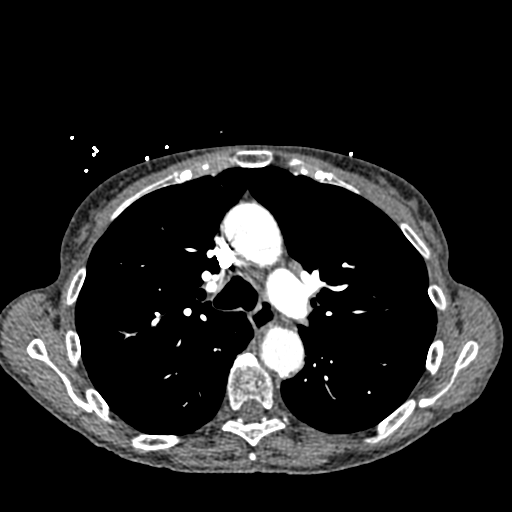
[im 210/302  lung]
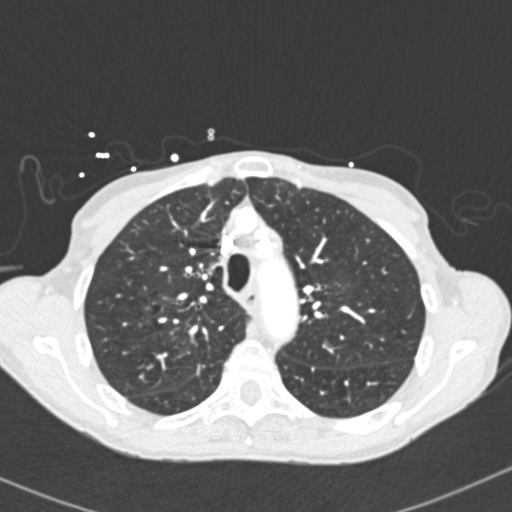
[im 223/302  soft-tissue]
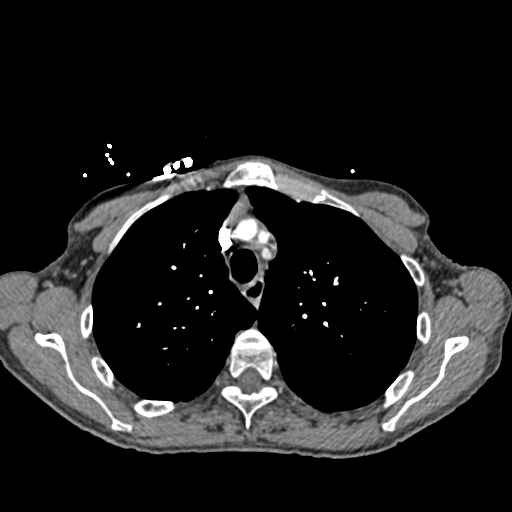
[im 249/302  lung]
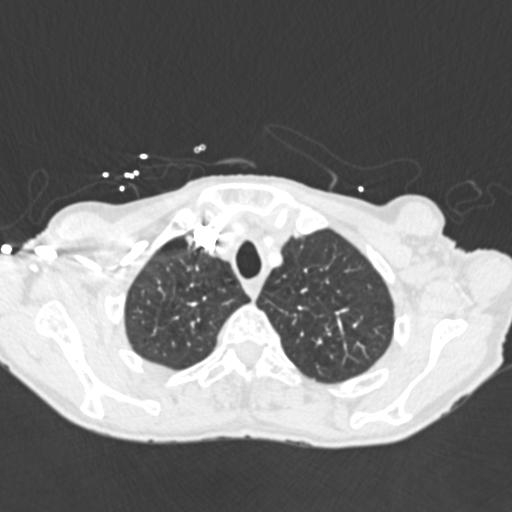
[im 262/302  soft-tissue]
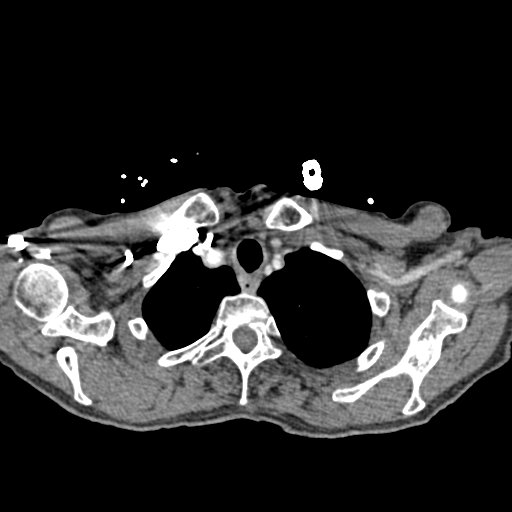
[im 288/302  lung]
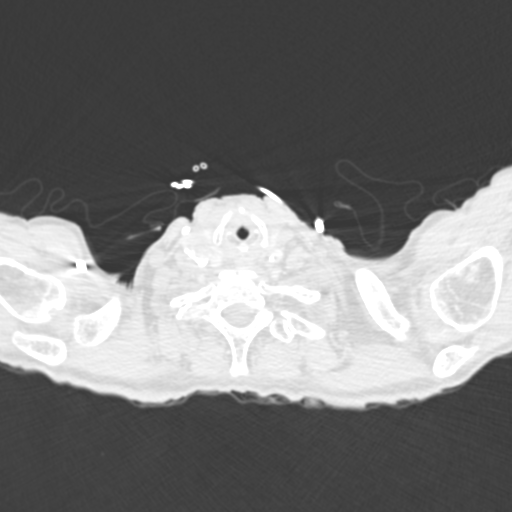

[Series 7: coronal mpr · coronal · 0.59mm/px · 3 of 70 slices shown]
[im 18/70  soft-tissue]
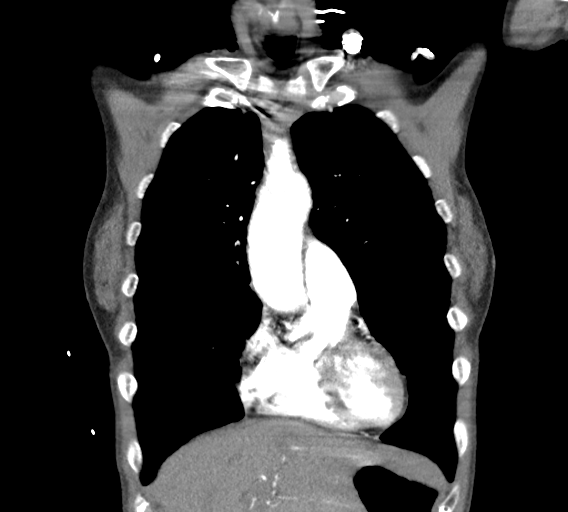
[im 35/70  soft-tissue]
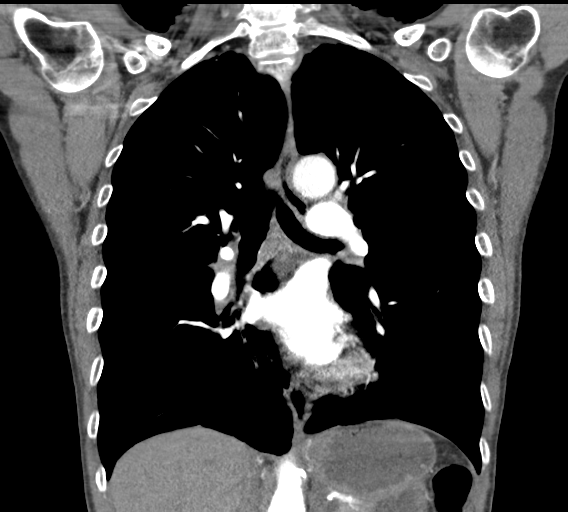
[im 52/70  soft-tissue]
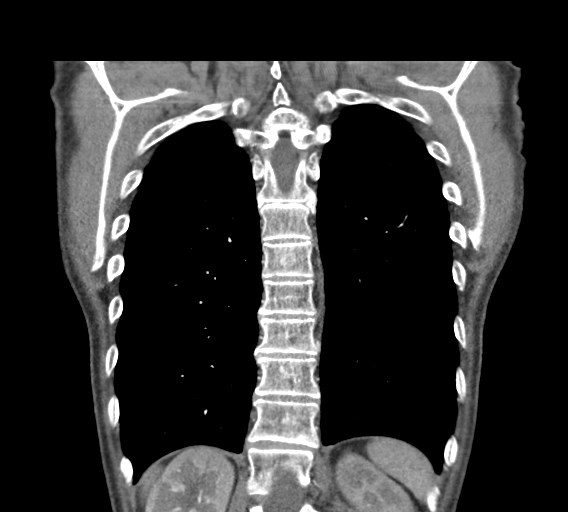

[18 of 46 positions shown; findings below may reference images not displayed]

FINDINGS: Cardiovascular: There are no filling defects in the pulmonary
arterial tree to suggest acute pulmonary thromboembolism. There is
no evidence of aortic aneurysm or dissection. Atherosclerotic
calcification of the aortic arch and descending thoracic aorta are
noted. There is calcified and smooth plaque just beyond the origin
of the left subclavian artery causing 50% narrowing. Mild 3 vessel
coronary artery calcifications.

Mediastinum/Nodes: No abnormal mediastinal adenopathy. No
pericardial effusion.

Lungs/Pleura: No pneumothorax or pleural effusion. Centrilobular
emphysema. There is nodularity in the anterior right upper lobe, in
the right middle lobe, and lingula, in the posterior left lower
lobe, and in the central right lower lobe. Some of these areas have
a tree-in-bud appearance. These findings suggest centrilobular
nodules with mucoid plugging of small airways.

Upper Abdomen: No acute abnormality.

Musculoskeletal: No acute rib fracture. No vertebral compression
deformity.

Review of the MIP images confirms the above findings.
IMPRESSION: No evidence of acute pulmonary thromboembolism.

Centrilobular nodules throughout the lungs with some areas of
tree-in-bud appearance. Findings suggest an inflammatory process of
small peripheral airways.

50% narrowing at the origin of the left subclavian artery

Aortic Atherosclerosis (A4RKX-F2G.G) and Emphysema (A4RKX-202.W).

## 2019-04-24 ENCOUNTER — Other Ambulatory Visit: Payer: Self-pay | Admitting: Internal Medicine

## 2019-04-24 DIAGNOSIS — J449 Chronic obstructive pulmonary disease, unspecified: Secondary | ICD-10-CM

## 2019-05-08 ENCOUNTER — Encounter: Payer: Self-pay | Admitting: Nurse Practitioner

## 2019-05-08 ENCOUNTER — Ambulatory Visit (INDEPENDENT_AMBULATORY_CARE_PROVIDER_SITE_OTHER): Payer: Medicare Other | Admitting: Nurse Practitioner

## 2019-05-08 ENCOUNTER — Other Ambulatory Visit: Payer: Self-pay

## 2019-05-08 ENCOUNTER — Other Ambulatory Visit: Payer: Self-pay | Admitting: Nurse Practitioner

## 2019-05-08 VITALS — BP 165/84 | HR 75 | Temp 98.1°F | Resp 16 | Ht 60.0 in | Wt 86.2 lb

## 2019-05-08 DIAGNOSIS — I6523 Occlusion and stenosis of bilateral carotid arteries: Secondary | ICD-10-CM

## 2019-05-08 DIAGNOSIS — Z23 Encounter for immunization: Secondary | ICD-10-CM

## 2019-05-08 DIAGNOSIS — I10 Essential (primary) hypertension: Secondary | ICD-10-CM

## 2019-05-08 DIAGNOSIS — F411 Generalized anxiety disorder: Secondary | ICD-10-CM | POA: Diagnosis not present

## 2019-05-08 DIAGNOSIS — J449 Chronic obstructive pulmonary disease, unspecified: Secondary | ICD-10-CM | POA: Diagnosis not present

## 2019-05-08 DIAGNOSIS — Z1231 Encounter for screening mammogram for malignant neoplasm of breast: Secondary | ICD-10-CM

## 2019-05-08 MED ORDER — BUPROPION HCL ER (SR) 100 MG PO TB12: 100.0000 mg | ORAL_TABLET | Freq: Every day | ORAL | 3 refills | Status: DC

## 2019-05-08 MED ORDER — LORAZEPAM 0.5 MG PO TABS: ORAL_TABLET | ORAL | 2 refills | Status: DC

## 2019-05-08 MED ORDER — AMLODIPINE BESYLATE 5 MG PO TABS: ORAL_TABLET | ORAL | 3 refills | Status: DC

## 2019-05-08 MED ORDER — PNEUMOCOCCAL 13-VAL CONJ VACC IM SUSP: 0.5000 mL | Freq: Once | INTRAMUSCULAR | 0 refills | Status: AC

## 2019-05-08 MED ORDER — CLOPIDOGREL BISULFATE 75 MG PO TABS: 75.0000 mg | ORAL_TABLET | Freq: Every day | ORAL | 3 refills | Status: DC

## 2019-05-08 NOTE — Progress Notes (Signed)
[facility] [place], [place] [address]  Internal MEDICINE  Office Visit Note  Patient Name: Christina Hester  [id number]  [id number]  Date of Service: 05/25/2019  Chief Complaint  Patient presents with  . Hypertension  . Hyperlipidemia  . Gastroesophageal Reflux    would like an rx of prilosec   . Medication Management    does not use spiriva because pt cannot afford, believes she does not see any changes  . Quality Metric Gaps    pna vacc and mammogram  . Foot Swelling    feet get really hot in the evenings, does not look like its swelling but believe it may be  . Hand Problem    hands get swollen  . Back Pain    The patient is here for routine follow up visit. She states that she has sensation of heat in her hands and feet. Feel swollen but are not. Cannot pinpoint anything that she is doing differently which would make this happen. No medication changes have been made. She does see Dr. Lucky Cowboy, vein and vascular specialist who follows her carotid arteries, AAA, and mild blockages in femoral arteries/veins.  She states that she eats a lot of spicy foods. Generally takes prilosec to help combat any heart burn she may get as a result. She needs a refill for this and all routine medication.  States that she has not been using her spiriva. States that it has become too expensive for her to afford. She does continue to take flovent twice daily and uses rescue inhaler as needed and as prescribed .      Current Medication: Outpatient Encounter Medications as of 05/08/2019  Medication Sig  . amLODipine (NORVASC) 5 MG tablet Take one tab po qhs  . buPROPion (WELLBUTRIN SR) 100 MG 12 hr tablet Take 1 tablet (100 mg total) by mouth daily.  . clopidogrel (PLAVIX) 75 MG tablet Take 1 tablet (75 mg total) by mouth daily.  . clotrimazole (MYCELEX) 10 MG troche DISSOLVE 1 TABLET BY MOUTH 5 TIMES DAILY  . FLOVENT HFA 110 MCG/ACT inhaler INHALE 2 PUFFS INTO THE LUNGS  TWO TIMES DAILY  . ipratropium-albuterol (DUONEB) 0.5-2.5 (3) MG/3ML SOLN Take 3 mLs by nebulization every 4 (four) hours.  Marland Kitchen LORazepam (ATIVAN) 0.5 MG tablet TAKE 1/2 TO 1 TABLET BY MOUTH AT NIGHT  . nitroGLYCERIN (NITROSTAT) 0.4 MG SL tablet Place 1 tablet (0.4 mg total) under the tongue every 5 (five) minutes as needed for chest pain.  Marland Kitchen nystatin (MYCOSTATIN) 100000 UNIT/ML suspension Take 5 mLs (500,000 Units total) by mouth 4 (four) times daily.  . ondansetron (ZOFRAN ODT) 4 MG disintegrating tablet Take 1 tablet (4 mg total) by mouth every 8 (eight) hours as needed for nausea or vomiting.  . Tiotropium Bromide Monohydrate (SPIRIVA RESPIMAT) 2.5 MCG/ACT AERS Inhale 1 puff into the lungs daily.  . VENTOLIN HFA 108 (90 Base) MCG/ACT inhaler INHALE 2 PUFFS INTO THE LUNGS EVERY 6 HOURS AS NEEDED FOR WHEEZING ORSHORTNESS OF BREATH  . [DISCONTINUED] amLODipine (NORVASC) 5 MG tablet Take one tab po qhs  . [DISCONTINUED] buPROPion (WELLBUTRIN SR) 100 MG 12 hr tablet TAKE 1 TABLET BY MOUTH ONCE DAILY  . [DISCONTINUED] clopidogrel (PLAVIX) 75 MG tablet Take 1 tablet (75 mg total) by mouth daily.  . [DISCONTINUED] LORazepam (ATIVAN) 0.5 MG tablet TAKE 1/2 TO 1 TABLET BY MOUTH AT NIGHT  . [EXPIRED] pneumococcal 13-valent conjugate vaccine (PREVNAR 13) SUSP injection Inject 0.5 mLs into the  muscle once for 1 dose.  . [DISCONTINUED] azithromycin (ZITHROMAX) 250 MG tablet Take as directed. (Patient not taking: Reported on 02/21/2019)  . [DISCONTINUED] lisinopril (PRINIVIL,ZESTRIL) 2.5 MG tablet Take 2 tablets (5 mg total) by mouth daily. (Patient not taking: Reported on 02/21/2019)   No facility-administered encounter medications on file as of 05/08/2019.     Surgical History: Past Surgical History:  Procedure Laterality Date  . ABDOMINAL HYSTERECTOMY    . APPENDECTOMY    . BREAST EXCISIONAL BIOPSY Left 80's or 90's   NEG  . COLONOSCOPY N/A 05/22/2018   Procedure: COLONOSCOPY;  Surgeon: Lin Landsman, MD;  Location: [facility] ENDOSCOPY;  Service: Gastroenterology;  Laterality: N/A;  . LEFT HEART CATH Bilateral 03/05/2017   Procedure: Left Heart Cath with poss PCI;  Surgeon: Wellington Hampshire, MD;  Location: [place] CV LAB;  Service: Cardiovascular;  Laterality: Bilateral;    Medical History: Past Medical History:  Diagnosis Date  . AAA (abdominal aortic aneurysm) ([place])    a. 05/2016 Abd U/S: 3.02 x 3.02 AAA.  Marland Kitchen CAD (coronary artery disease)    a. 02/2017 NSTEMI/Cath: LM nl, LAD mild dzs, D2 min irregs, LCX mild dzs, OM2 50ost, RCA 95p, 12m, L-->R collats, EF 55-65%.  . Cancer ([place])   . COPD (chronic obstructive pulmonary disease) ([place])   . Diastolic dysfunction    a. 02/2017 Echo: EF 55-60%, gr1 DD, ? inf HK, mild MR.  Marland Kitchen Hx of basal cell carcinoma    back   . 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 Hyperlipidemia   . Hypertension   . PAD (peripheral artery disease) ([place])    a. 05/2016 ABI's: R 1.22, L 1.11.  . Tobacco abuse     Family History: Family History  Problem Relation Age of Onset  . Breast cancer Mother   . Dementia Mother   . Hypertension Mother   . Breast cancer Maternal Aunt   . Breast cancer Maternal Aunt   . Breast cancer Maternal Aunt   . CVA Father     Social History   Socioeconomic History  . Marital status: Divorced    Spouse name: Not on file  . Number of children: Not on file  . Years of education: Not on file  . Highest education level: Not on file  Occupational History  . Not on file  Social Needs  . Financial resource strain: Not on file  . Food insecurity    Worry: Not on file    Inability: Not on file  . Transportation needs    Medical: Not on file    Non-medical: Not on file  Tobacco Use  . Smoking status: Former Smoker    Packs/day: 0.25    Quit date: 07/12/2017    Years since quitting: 1.8  . Smokeless tobacco: Never Used  . Tobacco comment: currently 90 days without smoking  Substance and  Sexual Activity  . Alcohol use: No  . Drug use: No  . Sexual activity: Not on file  Lifestyle  . Physical activity    Days per week: Not on file    Minutes per session: Not on file  . Stress: Not on file  Relationships  . Social Herbalist on phone: Not on file    Gets together: Not on file    Attends religious service: Not on file    Active member of club or organization: Not on file  Attends meetings of clubs or organizations: Not on file    Relationship status: Not on file  . Intimate partner violence    Fear of current or ex partner: Not on file    Emotionally abused: Not on file    Physically abused: Not on file    Forced sexual activity: Not on file  Other Topics Concern  . Not on file  Social History Narrative  . Not on file      Review of Systems  Constitutional: Negative for activity change, chills, fatigue and unexpected weight change.  HENT: Negative for congestion, postnasal drip, rhinorrhea, sneezing and sore throat.   Respiratory: Positive for shortness of breath. Negative for cough, chest tightness and wheezing.        Shortness of breath with exertion.   Cardiovascular: Negative for chest pain and palpitations.  Gastrointestinal: Negative for abdominal pain, constipation, diarrhea, nausea and vomiting.  Endocrine: Negative for cold intolerance, heat intolerance, polydipsia and polyuria.  Musculoskeletal: Positive for arthralgias and back pain. Negative for joint swelling and neck pain.       Intermittent issues with arthritis. .   Skin: Negative for rash.  Allergic/Immunologic: Negative for environmental allergies.  Neurological: Negative for dizziness, tremors, weakness, numbness and headaches.  Hematological: Negative for adenopathy. Does not bruise/bleed easily.  Psychiatric/Behavioral: Positive for dysphoric mood. Negative for behavioral problems (Depression), sleep disturbance and suicidal ideas. The patient is nervous/anxious.        Well  managed on current medication.    Today's Vitals   05/08/19 0930  BP: (!) 165/84  Pulse: 75  Resp: 16  Temp: 98.1 F (36.7 C)  SpO2: 96%  Weight: 86 lb 3.2 oz (39.1 kg)  Height: 5' (1.524 m)   Body mass index is 16.83 kg/m.  Physical Exam Vitals signs and nursing note reviewed.  Constitutional:      General: She is not in acute distress.    Appearance: Normal appearance. She is well-developed. She is not diaphoretic.  HENT:     Head: Normocephalic and atraumatic.     Nose: Nose normal.     Mouth/Throat:     Pharynx: No oropharyngeal exudate.  Eyes:     Pupils: Pupils are equal, round, and reactive to light.  Neck:     Musculoskeletal: Normal range of motion and neck supple.     Thyroid: No thyromegaly.     Vascular: Carotid bruit present. No JVD.     Trachea: No tracheal deviation.     Comments: Very soft carotid bruit auscultated on right side of neck.  Cardiovascular:     Rate and Rhythm: Normal rate and regular rhythm.     Heart sounds: Murmur present. No friction rub. No gallop.   Pulmonary:     Effort: Pulmonary effort is normal. No respiratory distress.     Breath sounds: Normal breath sounds. No wheezing or rales.  Chest:     Chest wall: No tenderness.     Breasts:        Right: Normal. No swelling, bleeding, inverted nipple, mass, nipple discharge, skin change or tenderness.        Left: Normal. No swelling, bleeding, inverted nipple, mass, nipple discharge, skin change or tenderness.  Abdominal:     General: Bowel sounds are normal.     Palpations: Abdomen is soft.     Tenderness: There is no abdominal tenderness.  Musculoskeletal: Normal range of motion.  Lymphadenopathy:     Cervical: No cervical adenopathy.  Skin:  General: Skin is warm and dry.     Capillary Refill: Capillary refill takes 2 to 3 seconds.  Neurological:     General: No focal deficit present.     Mental Status: She is alert and oriented to person, place, and time.     Cranial  Nerves: No cranial nerve deficit.  Psychiatric:        Attention and Perception: Attention and perception normal.        Mood and Affect: Affect normal. Mood is anxious.        Speech: Speech normal.        Behavior: Behavior normal. Behavior is cooperative.        Thought Content: Thought content normal.        Cognition and Memory: Cognition normal.        Judgment: Judgment normal.    Assessment/Plan: 1. Essential hypertension, benign This is generally stable. Continue amlodipine as prescribed. Refills provided today.  - amLODipine (NORVASC) 5 MG tablet; Take one tab po qhs  Dispense: 90 tablet; Refill: 3  2. Bilateral carotid artery stenosis Continue plavix as prescribed. Regular visits with vein and vascular as scheduled.  - clopidogrel (PLAVIX) 75 MG tablet; Take 1 tablet (75 mg total) by mouth daily.  Dispense: 90 tablet; Refill: 3  3. Generalized anxiety disorder Continue wellbutrin 100mg  daily. May take lorazepam 0.5mg , 1/2 to 1 tablet at bedtime as needed for anxiety/insomnia.  - buPROPion (WELLBUTRIN SR) 100 MG 12 hr tablet; Take 1 tablet (100 mg total) by mouth daily.  Dispense: 90 tablet; Refill: 3 - LORazepam (ATIVAN) 0.5 MG tablet; TAKE 1/2 TO 1 TABLET BY MOUTH AT NIGHT  Dispense: 30 tablet; Refill: 2  4. COPD with chronic bronchitis and emphysema ([place]) Continue inhalers as prescribed. Follow up with Dr. Devona Konig as scheduled.   5. Encounter for screening mammogram for malignant neoplasm of breast Screening mammogram to be scheduled.   6. Need for vaccination against Streptococcus pneumoniae using pneumococcal conjugate vaccine 13 Prescription for prevnar 13 sent to patient's pharmacy for administration. - pneumococcal 13-valent conjugate vaccine (PREVNAR 13) SUSP injection; Inject 0.5 mLs into the muscle once for 1 dose.  Dispense: 0.5 mL; Refill: 0  General Counseling: Christina Hester verbalizes understanding of the findings of todays visit and agrees with plan of  treatment. I have discussed any further diagnostic evaluation that may be needed or ordered today. We also reviewed her medications today. she has been encouraged to call the office with any questions or concerns that should arise related to todays visit.  This patient was seen by [place] with Dr Lavera Guise as a part of collaborative care agreement  Meds ordered this encounter  Medications  . pneumococcal 13-valent conjugate vaccine (PREVNAR 13) SUSP injection    Sig: Inject 0.5 mLs into the muscle once for 1 dose.    Dispense:  0.5 mL    Refill:  0    Order Specific Question:   Supervising Provider    Answer:   Lavera Guise [id number]  . buPROPion (WELLBUTRIN SR) 100 MG 12 hr tablet    Sig: Take 1 tablet (100 mg total) by mouth daily.    Dispense:  90 tablet    Refill:  3    Order Specific Question:   Supervising Provider    Answer:   Lavera Guise [id number]  . clopidogrel (PLAVIX) 75 MG tablet    Sig: Take 1 tablet (75 mg total) by mouth daily.  Dispense:  90 tablet    Refill:  3    Patient is due for a follow up appointment. Please contact the office at [phone]    Order Specific Question:   Supervising Provider    Answer:   Lavera Guise [id number]  . LORazepam (ATIVAN) 0.5 MG tablet    Sig: TAKE 1/2 TO 1 TABLET BY MOUTH AT NIGHT    Dispense:  30 tablet    Refill:  2    Order Specific Question:   Supervising Provider    Answer:   Lavera Guise [place]  . amLODipine (NORVASC) 5 MG tablet    Sig: Take one tab po qhs    Dispense:  90 tablet    Refill:  3    Order Specific Question:   Supervising Provider    Answer:   Lavera Guise [id number]    Time spent: 3 Minutes      Dr Lavera Guise Internal medicine

## 2019-05-12 DIAGNOSIS — Z23 Encounter for immunization: Secondary | ICD-10-CM | POA: Diagnosis not present

## 2019-05-15 ENCOUNTER — Telehealth: Payer: Self-pay

## 2019-05-15 NOTE — Telephone Encounter (Signed)
Pt called stating that she is having a tooth extraction on November 6th and wanted to know when to stop taking her plavix.  Spoke with [name] and per [name] I advised pt to stop taking her plavix 5 days before her extraction and she can start it again the say after her procedure.

## 2019-05-25 DIAGNOSIS — Z1231 Encounter for screening mammogram for malignant neoplasm of breast: Secondary | ICD-10-CM | POA: Insufficient documentation

## 2019-05-25 DIAGNOSIS — Z23 Encounter for immunization: Secondary | ICD-10-CM | POA: Insufficient documentation

## 2019-06-20 ENCOUNTER — Other Ambulatory Visit: Payer: Self-pay

## 2019-06-20 ENCOUNTER — Ambulatory Visit (INDEPENDENT_AMBULATORY_CARE_PROVIDER_SITE_OTHER): Payer: Medicare Other | Admitting: Nurse Practitioner

## 2019-06-20 ENCOUNTER — Encounter: Payer: Self-pay | Admitting: Nurse Practitioner

## 2019-06-20 VITALS — Temp 98.6°F

## 2019-06-20 DIAGNOSIS — J441 Chronic obstructive pulmonary disease with (acute) exacerbation: Secondary | ICD-10-CM

## 2019-06-20 DIAGNOSIS — I10 Essential (primary) hypertension: Secondary | ICD-10-CM

## 2019-06-20 DIAGNOSIS — R062 Wheezing: Secondary | ICD-10-CM | POA: Diagnosis not present

## 2019-06-20 MED ORDER — BENZONATATE 200 MG PO CAPS: 200.0000 mg | ORAL_CAPSULE | Freq: Two times a day (BID) | ORAL | 0 refills | Status: DC | PRN

## 2019-06-20 MED ORDER — PROMETHAZINE-CODEINE 6.25-10 MG/5ML PO SOLN: 5.0000 mL | Freq: Three times a day (TID) | ORAL | 0 refills | Status: DC | PRN

## 2019-06-20 MED ORDER — METHYLPREDNISOLONE 4 MG PO TBPK: ORAL_TABLET | ORAL | 0 refills | Status: DC

## 2019-06-20 MED ORDER — AZITHROMYCIN 250 MG PO TABS: ORAL_TABLET | ORAL | 0 refills | Status: DC

## 2019-06-20 NOTE — Progress Notes (Signed)
[facility] [place], [place] [address]  Internal MEDICINE  Telephone Visit  Patient Name: Christina Hester  [id number]  [id number]  Date of Service: 06/20/2019  I connected with the patient at 10:45am by telephone and verified the patients identity using two identifiers.   I discussed the limitations, risks, security and privacy concerns of performing an evaluation and management service by telephone and the availability of in person appointments. I also discussed with the patient that there may be a patient responsible charge related to the service.  The patient expressed understanding and agrees to proceed.    Chief Complaint  Patient presents with  . Telephone Assessment  . Telephone Screen  . Sinusitis  . Cough    The patient has been contacted via telephone for follow up visit due to concerns for spread of novel coronavirus. The patient presents for acute visit.  The patient states that she has head congestion, chest tightness, and cough. She states that she is coughing so much it is hurting her chest. Cough is deep and congested. She is bringing up some sputum when she coughs. She states that symptoms started on Wednesday and have gradually become worse. She denies fever, nausea, or vomiting.       Current Medication: Outpatient Encounter Medications as of 06/20/2019  Medication Sig  . amLODipine (NORVASC) 5 MG tablet Take one tab po qhs  . buPROPion (WELLBUTRIN SR) 100 MG 12 hr tablet Take 1 tablet (100 mg total) by mouth daily.  . clopidogrel (PLAVIX) 75 MG tablet Take 1 tablet (75 mg total) by mouth daily.  . clotrimazole (MYCELEX) 10 MG troche DISSOLVE 1 TABLET BY MOUTH 5 TIMES DAILY  . FLOVENT HFA 110 MCG/ACT inhaler INHALE 2 PUFFS INTO THE LUNGS TWO TIMES DAILY  . ipratropium-albuterol (DUONEB) 0.5-2.5 (3) MG/3ML SOLN Take 3 mLs by nebulization every 4 (four) hours.  Marland Kitchen LORazepam (ATIVAN) 0.5 MG tablet TAKE 1/2 TO 1 TABLET BY MOUTH AT NIGHT  .  nitroGLYCERIN (NITROSTAT) 0.4 MG SL tablet Place 1 tablet (0.4 mg total) under the tongue every 5 (five) minutes as needed for chest pain.  Marland Kitchen nystatin (MYCOSTATIN) 100000 UNIT/ML suspension Take 5 mLs (500,000 Units total) by mouth 4 (four) times daily.  . ondansetron (ZOFRAN ODT) 4 MG disintegrating tablet Take 1 tablet (4 mg total) by mouth every 8 (eight) hours as needed for nausea or vomiting.  . Tiotropium Bromide Monohydrate (SPIRIVA RESPIMAT) 2.5 MCG/ACT AERS Inhale 1 puff into the lungs daily.  . VENTOLIN HFA 108 (90 Base) MCG/ACT inhaler INHALE 2 PUFFS INTO THE LUNGS EVERY 6 HOURS AS NEEDED FOR WHEEZING ORSHORTNESS OF BREATH  . azithromycin (ZITHROMAX) 250 MG tablet z-pack - take as directed for 5 days  . benzonatate (TESSALON) 200 MG capsule Take 1 capsule (200 mg total) by mouth 2 (two) times daily as needed for cough.  . methylPREDNISolone (MEDROL) 4 MG TBPK tablet Take by mouth as directed for 6 days  . Promethazine-Codeine 6.25-10 MG/5ML SOLN Take 5 mLs by mouth 3 (three) times daily as needed.   No facility-administered encounter medications on file as of 06/20/2019.     Surgical History: Past Surgical History:  Procedure Laterality Date  . ABDOMINAL HYSTERECTOMY    . APPENDECTOMY    . BREAST EXCISIONAL BIOPSY Left 80's or 90's   NEG  . COLONOSCOPY N/A 05/22/2018   Procedure: COLONOSCOPY;  Surgeon: Lin Landsman, MD;  Location: [facility] ENDOSCOPY;  Service: Gastroenterology;  Laterality: N/A;  .  LEFT HEART CATH Bilateral 03/05/2017   Procedure: Left Heart Cath with poss PCI;  Surgeon: Wellington Hampshire, MD;  Location: [place] CV LAB;  Service: Cardiovascular;  Laterality: Bilateral;    Medical History: Past Medical History:  Diagnosis Date  . AAA (abdominal aortic aneurysm) ([place])    a. 05/2016 Abd U/S: 3.02 x 3.02 AAA.  Marland Kitchen CAD (coronary artery disease)    a. 02/2017 NSTEMI/Cath: LM nl, LAD mild dzs, D2 min irregs, LCX mild dzs, OM2 50ost, RCA 95p, 185m, L-->R  collats, EF 55-65%.  . Cancer ([place])   . COPD (chronic obstructive pulmonary disease) ([place])   . Diastolic dysfunction    a. 02/2017 Echo: EF 55-60%, gr1 DD, ? inf HK, mild MR.  Marland Kitchen Hx of basal cell carcinoma    back   . 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 Hyperlipidemia   . Hypertension   . PAD (peripheral artery disease) ([place])    a. 05/2016 ABI's: R 1.22, L 1.11.  . Tobacco abuse     Family History: Family History  Problem Relation Age of Onset  . Breast cancer Mother   . Dementia Mother   . Hypertension Mother   . Breast cancer Maternal Aunt   . Breast cancer Maternal Aunt   . Breast cancer Maternal Aunt   . CVA Father     Social History   Socioeconomic History  . Marital status: Divorced    Spouse name: Not on file  . Number of children: Not on file  . Years of education: Not on file  . Highest education level: Not on file  Occupational History  . Not on file  Social Needs  . Financial resource strain: Not on file  . Food insecurity    Worry: Not on file    Inability: Not on file  . Transportation needs    Medical: Not on file    Non-medical: Not on file  Tobacco Use  . Smoking status: Former Smoker    Packs/day: 0.25    Quit date: 07/12/2017    Years since quitting: 1.9  . Smokeless tobacco: Never Used  . Tobacco comment: currently 90 days without smoking  Substance and Sexual Activity  . Alcohol use: No  . Drug use: No  . Sexual activity: Not on file  Lifestyle  . Physical activity    Days per week: Not on file    Minutes per session: Not on file  . Stress: Not on file  Relationships  . Social Herbalist on phone: Not on file    Gets together: Not on file    Attends religious service: Not on file    Active member of club or organization: Not on file    Attends meetings of clubs or organizations: Not on file    Relationship status: Not on file  . Intimate partner violence    Fear of current or ex partner:  Not on file    Emotionally abused: Not on file    Physically abused: Not on file    Forced sexual activity: Not on file  Other Topics Concern  . Not on file  Social History Narrative  . Not on file      Review of Systems  Constitutional: Positive for fatigue. Negative for activity change, appetite change and fever.  HENT: Positive for congestion, postnasal drip and rhinorrhea. Negative for sore throat.   Respiratory: Positive for cough and wheezing.  Cardiovascular: Negative for chest pain and palpitations.  Gastrointestinal: Negative for nausea and vomiting.  Musculoskeletal: Positive for myalgias.  Allergic/Immunologic: Positive for environmental allergies.  Neurological: Positive for headaches.   Today's Vitals   06/20/19 1030  Temp: 98.6 F (37 C)  SpO2: 95%   There is no height or weight on file to calculate BMI.  Observation/Objective:   The patient is alert and oriented. She is pleasant and answers all questions appropriately. Breathing is non-labored. She is in no acute distress at this time. The patient has a deep, congested cough. Unable to go more than five minutes without cough.    Assessment/Plan: 1. COPD with acute exacerbation ([place]) Start x-pack. Take as directed for 5 days. Also add medrol dose pack as directed for 6 days. Rest and increase fluids. Promethazine/codeine cough syrup may be taken up to three times daily if needed for cough. Advised patient not to overuse this medicine and not to mix with other medications or alcohol as it can cause respiratory distress, sleepiness or dizziness. Should also avoid driving. Patient voiced understanding and agreement. May also take tessalon perls as needed and as prescribed for cough.  - methylPREDNISolone (MEDROL) 4 MG TBPK tablet; Take by mouth as directed for 6 days  Dispense: 21 tablet; Refill: 0 - azithromycin (ZITHROMAX) 250 MG tablet; z-pack - take as directed for 5 days  Dispense: 6 tablet; Refill: 0 -  Promethazine-Codeine 6.25-10 MG/5ML SOLN; Take 5 mLs by mouth 3 (three) times daily as needed.  Dispense: 120 mL; Refill: 0 - benzonatate (TESSALON) 200 MG capsule; Take 1 capsule (200 mg total) by mouth 2 (two) times daily as needed for cough.  Dispense: 30 capsule; Refill: 0  2. Wheezing Continue to use inhalers and nebulizer treatments as needed and as prescribed .  3. Benign hypertension Stable. Continue bp medication as prescribed   General Counseling: Jeffie verbalizes understanding of the findings of today's phone visit and agrees with plan of treatment. I have discussed any further diagnostic evaluation that may be needed or ordered today. We also reviewed her medications today. she has been encouraged to call the office with any questions or concerns that should arise related to todays visit.  This patient was seen by [place] with Dr Lavera Guise as a part of collaborative care agreement  Meds ordered this encounter  Medications  . methylPREDNISolone (MEDROL) 4 MG TBPK tablet    Sig: Take by mouth as directed for 6 days    Dispense:  21 tablet    Refill:  0    Order Specific Question:   Supervising Provider    Answer:   Lavera Guise [place]  . azithromycin (ZITHROMAX) 250 MG tablet    Sig: z-pack - take as directed for 5 days    Dispense:  6 tablet    Refill:  0    Order Specific Question:   Supervising Provider    Answer:   Lavera Guise [place]  . Promethazine-Codeine 6.25-10 MG/5ML SOLN    Sig: Take 5 mLs by mouth 3 (three) times daily as needed.    Dispense:  120 mL    Refill:  0    Order Specific Question:   Supervising Provider    Answer:   Lavera Guise [id number]  . benzonatate (TESSALON) 200 MG capsule    Sig: Take 1 capsule (200 mg total) by mouth 2 (two) times daily as needed for cough.    Dispense:  30 capsule  Refill:  0    Order Specific Question:   Supervising Provider    Answer:   Lavera Guise [id number]    Time spent: 74  Minutes    Dr Lavera Guise Internal medicine

## 2019-07-06 ENCOUNTER — Other Ambulatory Visit: Payer: Self-pay | Admitting: Nurse Practitioner

## 2019-07-06 DIAGNOSIS — M25551 Pain in right hip: Secondary | ICD-10-CM

## 2019-07-06 MED ORDER — TRAMADOL HCL 50 MG PO TABS: 50.0000 mg | ORAL_TABLET | Freq: Three times a day (TID) | ORAL | 0 refills | Status: DC | PRN

## 2019-07-06 NOTE — Progress Notes (Signed)
Patient called office complaining of right hip pain. Sent prescription for #15 tramadol which can be taken up to three times daily if needed for pain. Will also get right hip xray for further evaluation. prders placed in epic. Prescription sent to [facility].

## 2019-07-07 ENCOUNTER — Ambulatory Visit
Admission: RE | Admit: 2019-07-07 | Discharge: 2019-07-07 | Disposition: A | Discharge status: Home / Self Care | Payer: Medicare Other | Source: Ambulatory Visit | Attending: Nurse Practitioner | Admitting: Nurse Practitioner

## 2019-07-07 ENCOUNTER — Telehealth: Payer: Self-pay

## 2019-07-07 ENCOUNTER — Other Ambulatory Visit: Payer: Self-pay

## 2019-07-07 DIAGNOSIS — M25551 Pain in right hip: Secondary | ICD-10-CM | POA: Diagnosis not present

## 2019-07-07 NOTE — Progress Notes (Signed)
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 There is no acute fracture, dislocation or other problems. If pain continues, we can refer her to see orthopedic provider. Thanks

## 2019-07-07 NOTE — Telephone Encounter (Signed)
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 There is no acute fracture, dislocation or other problems. If pain continues, we can refer her to see orthopedic provider. Thanks

## 2019-07-07 NOTE — Telephone Encounter (Signed)
Let pt know that she has arthritis in low back and both hips, no fractures or dislocations and if she would like to be referred to orthopedic then we can do that. Pt states that getting in and out of her car aggravates her so she will stop doing that as much and if she feels like she needs to be referred to orthopedic she will give [place] a call back.

## 2019-07-14 ENCOUNTER — Encounter: Payer: Self-pay | Admitting: Adult Health

## 2019-07-14 ENCOUNTER — Ambulatory Visit (INDEPENDENT_AMBULATORY_CARE_PROVIDER_SITE_OTHER): Payer: Medicare Other | Admitting: Adult Health

## 2019-07-14 ENCOUNTER — Other Ambulatory Visit: Payer: Self-pay

## 2019-07-14 VITALS — Temp 98.6°F

## 2019-07-14 DIAGNOSIS — I10 Essential (primary) hypertension: Secondary | ICD-10-CM | POA: Diagnosis not present

## 2019-07-14 DIAGNOSIS — R062 Wheezing: Secondary | ICD-10-CM

## 2019-07-14 DIAGNOSIS — J441 Chronic obstructive pulmonary disease with (acute) exacerbation: Secondary | ICD-10-CM

## 2019-07-14 MED ORDER — AZITHROMYCIN 250 MG PO TABS: ORAL_TABLET | ORAL | 0 refills | Status: DC

## 2019-07-14 MED ORDER — METHYLPREDNISOLONE 4 MG PO TABS: 4.0000 mg | ORAL_TABLET | Freq: Every day | ORAL | 0 refills | Status: DC

## 2019-07-14 NOTE — Progress Notes (Signed)
[facility] [place],  [id number]  Internal MEDICINE  Telephone Visit  Patient Name: Christina Hester  [id number]  [id number]  Date of Service: 07/14/2019  I connected with the patient at [phone] by telephone and verified the patients identity using two identifiers.   I discussed the limitations, risks, security and privacy concerns of performing an evaluation and management service by telephone and the availability of in person appointments. I also discussed with the patient that there may be a patient responsible charge related to the service.  The patient expressed understanding and agrees to proceed. No CXR on file since august 2019, and pt is former smoker.    Chief Complaint  Patient presents with  . Telephone Assessment  . Telephone Screen  . Cough    going for few days   . Wheezing    HPI  Pt seen via telephone.  She reports she has started coughing, and having SOB for a few days. She is using her nebulizer every 3 hours, and her rescue inhaler a few times a day. She reports the inhaler does not help very much. She was just treated at the beginning of the month for copd exacerbation with z pak and medrol dose pack. She did get better, however a few days ago her symptoms returned.    Current Medication: Outpatient Encounter Medications as of 07/14/2019  Medication Sig  . amLODipine (NORVASC) 5 MG tablet Take one tab po qhs  . benzonatate (TESSALON) 200 MG capsule Take 1 capsule (200 mg total) by mouth 2 (two) times daily as needed for cough.  Marland Kitchen buPROPion (WELLBUTRIN SR) 100 MG 12 hr tablet Take 1 tablet (100 mg total) by mouth daily.  . clopidogrel (PLAVIX) 75 MG tablet Take 1 tablet (75 mg total) by mouth daily.  . clotrimazole (MYCELEX) 10 MG troche DISSOLVE 1 TABLET BY MOUTH 5 TIMES DAILY  . FLOVENT HFA 110 MCG/ACT inhaler INHALE 2 PUFFS INTO THE LUNGS TWO TIMES DAILY  . ipratropium-albuterol (DUONEB) 0.5-2.5 (3) MG/3ML SOLN Take 3 mLs by  nebulization every 4 (four) hours.  Marland Kitchen LORazepam (ATIVAN) 0.5 MG tablet TAKE 1/2 TO 1 TABLET BY MOUTH AT NIGHT  . nitroGLYCERIN (NITROSTAT) 0.4 MG SL tablet Place 1 tablet (0.4 mg total) under the tongue every 5 (five) minutes as needed for chest pain.  Marland Kitchen nystatin (MYCOSTATIN) 100000 UNIT/ML suspension Take 5 mLs (500,000 Units total) by mouth 4 (four) times daily.  . ondansetron (ZOFRAN ODT) 4 MG disintegrating tablet Take 1 tablet (4 mg total) by mouth every 8 (eight) hours as needed for nausea or vomiting.  . Promethazine-Codeine 6.25-10 MG/5ML SOLN Take 5 mLs by mouth 3 (three) times daily as needed.  . Tiotropium Bromide Monohydrate (SPIRIVA RESPIMAT) 2.5 MCG/ACT AERS Inhale 1 puff into the lungs daily.  . traMADol (ULTRAM) 50 MG tablet Take 1 tablet (50 mg total) by mouth every 8 (eight) hours as needed.  . VENTOLIN HFA 108 (90 Base) MCG/ACT inhaler INHALE 2 PUFFS INTO THE LUNGS EVERY 6 HOURS AS NEEDED FOR WHEEZING ORSHORTNESS OF BREATH  . [DISCONTINUED] methylPREDNISolone (MEDROL) 4 MG TBPK tablet Take by mouth as directed for 6 days  . azithromycin (ZITHROMAX) 250 MG tablet Take as directed.  . methylPREDNISolone (MEDROL) 4 MG tablet Take 1 tablet (4 mg total) by mouth daily.  . [DISCONTINUED] azithromycin (ZITHROMAX) 250 MG tablet z-pack - take as directed for 5 days (Patient not taking: Reported on 07/14/2019)   No facility-administered encounter medications on  file as of 07/14/2019.    Surgical History: Past Surgical History:  Procedure Laterality Date  . ABDOMINAL HYSTERECTOMY    . APPENDECTOMY    . BREAST EXCISIONAL BIOPSY Left 80's or 90's   NEG  . COLONOSCOPY N/A 05/22/2018   Procedure: COLONOSCOPY;  Surgeon: Lin Landsman, MD;  Location: [facility] ENDOSCOPY;  Service: Gastroenterology;  Laterality: N/A;  . LEFT HEART CATH Bilateral 03/05/2017   Procedure: Left Heart Cath with poss PCI;  Surgeon: Wellington Hampshire, MD;  Location: [place] [facility] LAB;  Service: Cardiovascular;   Laterality: Bilateral;    Medical History: Past Medical History:  Diagnosis Date  . AAA (abdominal aortic aneurysm) ([place])    a. 05/2016 Abd U/S: 3.02 x 3.02 AAA.  Marland Kitchen CAD (coronary artery disease)    a. 02/2017 NSTEMI/Cath: LM nl, LAD mild dzs, D2 min irregs, LCX mild dzs, OM2 50ost, RCA 95p, 111m, L-->R collats, EF 55-65%.  . Cancer ([place])   . COPD (chronic obstructive pulmonary disease) ([place])   . Diastolic dysfunction    a. 02/2017 Echo: EF 55-60%, gr1 DD, ? inf HK, mild MR.  Marland Kitchen Hx of basal cell carcinoma    back   . 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 Hyperlipidemia   . Hypertension   . PAD (peripheral artery disease) ([place])    a. 05/2016 ABI's: R 1.22, L 1.11.  . Tobacco abuse     Family History: Family History  Problem Relation Age of Onset  . Breast cancer Mother   . Dementia Mother   . Hypertension Mother   . Breast cancer Maternal Aunt   . Breast cancer Maternal Aunt   . Breast cancer Maternal Aunt   . CVA Father     Social History   Socioeconomic History  . Marital status: Divorced    Spouse name: Not on file  . Number of children: Not on file  . Years of education: Not on file  . Highest education level: Not on file  Occupational History  . Not on file  Tobacco Use  . Smoking status: Former Smoker    Packs/day: 0.25    Quit date: 07/12/2017    Years since quitting: 2.0  . Smokeless tobacco: Never Used  . Tobacco comment: currently 90 days without smoking  Substance and Sexual Activity  . Alcohol use: No  . Drug use: No  . Sexual activity: Not on file  Other Topics Concern  . Not on file  Social History Narrative  . Not on file   Social Determinants of Health   Financial Resource Strain:   . Difficulty of Paying Living Expenses: Not on file  Food Insecurity:   . Worried About Charity fundraiser in the Last Year: Not on file  . Ran Out of Food in the Last Year: Not on file  Transportation Needs:   . Lack of  Transportation (Medical): Not on file  . Lack of Transportation (Non-Medical): Not on file  Physical Activity:   . Days of Exercise per Week: Not on file  . Minutes of Exercise per Session: Not on file  Stress:   . Feeling of Stress : Not on file  Social Connections:   . Frequency of Communication with Friends and Family: Not on file  . Frequency of Social Gatherings with Friends and Family: Not on file  . Attends Religious Services: Not on file  . Active Member of Clubs or Organizations: Not on  file  . Attends Archivist Meetings: Not on file  . Marital Status: Not on file  Intimate Partner Violence:   . Fear of Current or Ex-Partner: Not on file  . Emotionally Abused: Not on file  . Physically Abused: Not on file  . Sexually Abused: Not on file      Review of Systems  Constitutional: Negative for chills, fatigue and unexpected weight change.  HENT: Negative for congestion, rhinorrhea, sneezing and sore throat.   Eyes: Negative for photophobia, pain and redness.  Respiratory: Negative for cough, chest tightness and shortness of breath.   Cardiovascular: Negative for chest pain and palpitations.  Gastrointestinal: Negative for abdominal pain, constipation, diarrhea, nausea and vomiting.  Endocrine: Negative.   Genitourinary: Negative for dysuria and frequency.  Musculoskeletal: Negative for arthralgias, back pain, joint swelling and neck pain.  Skin: Negative for rash.  Allergic/Immunologic: Negative.   Neurological: Negative for tremors and numbness.  Hematological: Negative for adenopathy. Does not bruise/bleed easily.  Psychiatric/Behavioral: Negative for behavioral problems and sleep disturbance. The patient is not nervous/anxious.     Vital Signs: Temp 98.6 F (37 C)    Observation/Objective:  Pt sounds a baseline, NAD noted. Speaking in full sentences.    Assessment/Plan: 1. COPD with acute exacerbation ([place]) Will review CXR when available, Take  medrol dose pack and z pak as directed.  - DG Chest 2 View; Future - methylPREDNISolone (MEDROL) 4 MG tablet; Take 1 tablet (4 mg total) by mouth daily.  Dispense: 42 tablet; Refill: 0 - azithromycin (ZITHROMAX) 250 MG tablet; Take as directed.  Dispense: 6 each; Refill: 0  2. Wheezing Stable, continue to use inhalers as directed.   3. Essential hypertension, benign Controlled, continue present management.   General Counseling: Siomara verbalizes understanding of the findings of today's phone visit and agrees with plan of treatment. I have discussed any further diagnostic evaluation that may be needed or ordered today. We also reviewed her medications today. she has been encouraged to call the office with any questions or concerns that should arise related to todays visit.    Orders Placed This Encounter  Procedures  . DG Chest 2 View    Meds ordered this encounter  Medications  . methylPREDNISolone (MEDROL) 4 MG tablet    Sig: Take 1 tablet (4 mg total) by mouth daily.    Dispense:  42 tablet    Refill:  0    Please taper over 12 days. 6 tablets on days 1 and 2, 5 tablets on days 3 and 4, 4 tablets on days 5 and 6, 3 tablets on days 7 and 8, 2 tablets on days 9 and 10 and 1 tab on days 11 and 12.  . azithromycin (ZITHROMAX) 250 MG tablet    Sig: Take as directed.    Dispense:  6 each    Refill:  0    Time spent: [place] AGNP-C Internal medicine

## 2019-07-15 ENCOUNTER — Ambulatory Visit
Admission: RE | Admit: 2019-07-15 | Discharge: 2019-07-15 | Disposition: A | Discharge status: Home / Self Care | Payer: Medicare Other | Source: Ambulatory Visit | Attending: Adult Health | Admitting: Adult Health

## 2019-07-15 ENCOUNTER — Other Ambulatory Visit: Payer: Self-pay

## 2019-07-15 DIAGNOSIS — J441 Chronic obstructive pulmonary disease with (acute) exacerbation: Secondary | ICD-10-CM | POA: Diagnosis not present

## 2019-07-15 DIAGNOSIS — J449 Chronic obstructive pulmonary disease, unspecified: Secondary | ICD-10-CM

## 2019-07-15 MED ORDER — IPRATROPIUM-ALBUTEROL 0.5-2.5 (3) MG/3ML IN SOLN: 3.0000 mL | RESPIRATORY_TRACT | 3 refills | Status: DC

## 2019-07-17 ENCOUNTER — Telehealth: Payer: Self-pay

## 2019-07-17 ENCOUNTER — Other Ambulatory Visit: Payer: Self-pay | Admitting: Adult Health

## 2019-07-17 DIAGNOSIS — J441 Chronic obstructive pulmonary disease with (acute) exacerbation: Secondary | ICD-10-CM

## 2019-07-17 MED ORDER — PROMETHAZINE-CODEINE 6.25-10 MG/5ML PO SOLN: 5.0000 mL | Freq: Three times a day (TID) | ORAL | 0 refills | Status: DC | PRN

## 2019-07-17 NOTE — Progress Notes (Signed)
Refilled patient cough syrup at this time.

## 2019-07-17 NOTE — Progress Notes (Signed)
Pt advised no pneumonia shows copd

## 2019-07-17 NOTE — Telephone Encounter (Signed)
Pt ADVISED THAT GO HEAD AND START ANTIBIOTIC AND WE GOING TO SEND COUGH SYRUP YO PHAR

## 2019-08-07 ENCOUNTER — Ambulatory Visit
Admission: RE | Admit: 2019-08-07 | Discharge: 2019-08-07 | Disposition: A | Discharge status: Home / Self Care | Payer: Medicare Other | Source: Ambulatory Visit | Attending: Nurse Practitioner | Admitting: Nurse Practitioner

## 2019-08-07 DIAGNOSIS — Z1231 Encounter for screening mammogram for malignant neoplasm of breast: Secondary | ICD-10-CM

## 2019-08-08 ENCOUNTER — Ambulatory Visit: Payer: Medicare Other | Attending: Internal Medicine

## 2019-08-08 DIAGNOSIS — Z23 Encounter for immunization: Secondary | ICD-10-CM | POA: Insufficient documentation

## 2019-08-08 NOTE — Progress Notes (Signed)
Negative mammogram

## 2019-08-08 NOTE — Progress Notes (Signed)
   Covid-19 Vaccination Clinic  Name:  Christina Hester    MRN: [id number] DOB: [birth year]  08/08/2019  [name] was observed post Covid-19 immunization for 15 minutes without incidence. She was provided with Vaccine Information Sheet and instruction to access the V-Safe system.   [name] was instructed to call 911 with any severe reactions post vaccine: Marland Kitchen Difficulty breathing  . Swelling of your face and throat  . A fast heartbeat  . A bad rash all over your body  . Dizziness and weakness    Immunizations Administered    Name Date Dose VIS Date Route   Pfizer COVID-19 Vaccine 08/08/2019  4:55 PM 0.3 mL 06/27/2019 Intramuscular   Manufacturer: [place]   Lot: [id number]   [place]: [id number]

## 2019-08-26 ENCOUNTER — Other Ambulatory Visit (INDEPENDENT_AMBULATORY_CARE_PROVIDER_SITE_OTHER): Payer: Medicare Other

## 2019-08-26 ENCOUNTER — Ambulatory Visit (INDEPENDENT_AMBULATORY_CARE_PROVIDER_SITE_OTHER): Payer: Medicare Other | Admitting: Vascular Surgery

## 2019-08-26 ENCOUNTER — Encounter (INDEPENDENT_AMBULATORY_CARE_PROVIDER_SITE_OTHER): Payer: Medicare Other

## 2019-08-29 ENCOUNTER — Ambulatory Visit: Payer: Medicare Other | Attending: Internal Medicine

## 2019-08-29 DIAGNOSIS — Z23 Encounter for immunization: Secondary | ICD-10-CM

## 2019-08-29 NOTE — Progress Notes (Signed)
   Covid-19 Vaccination Clinic  Name:  Christina Hester    MRN: [id number] DOB: Mar 07, 1941  08/29/2019  [name] was observed post Covid-19 immunization for 15 minutes without incidence. She was provided with Vaccine Information Sheet and instruction to access the V-Safe system.   [name] was instructed to call 911 with any severe reactions post vaccine: Marland Kitchen Difficulty breathing  . Swelling of your face and throat  . A fast heartbeat  . A bad rash all over your body  . Dizziness and weakness    Immunizations Administered    Name Date Dose VIS Date Route   Pfizer COVID-19 Vaccine 08/29/2019  3:38 PM 0.3 mL 06/27/2019 Intramuscular   Manufacturer: [place]   Lot: [id number]   [place]: [id number]

## 2019-09-05 ENCOUNTER — Ambulatory Visit (INDEPENDENT_AMBULATORY_CARE_PROVIDER_SITE_OTHER): Payer: Medicare Other | Admitting: Vascular Surgery

## 2019-09-05 ENCOUNTER — Encounter (INDEPENDENT_AMBULATORY_CARE_PROVIDER_SITE_OTHER): Payer: Medicare Other

## 2019-09-05 ENCOUNTER — Other Ambulatory Visit (INDEPENDENT_AMBULATORY_CARE_PROVIDER_SITE_OTHER): Payer: Medicare Other

## 2019-09-08 ENCOUNTER — Other Ambulatory Visit: Payer: Self-pay

## 2019-09-08 ENCOUNTER — Encounter: Payer: Self-pay | Admitting: Nurse Practitioner

## 2019-09-08 ENCOUNTER — Ambulatory Visit (INDEPENDENT_AMBULATORY_CARE_PROVIDER_SITE_OTHER): Payer: Medicare Other | Admitting: Nurse Practitioner

## 2019-09-08 VITALS — BP 151/77 | HR 91 | Temp 97.4°F | Resp 16 | Ht 60.0 in | Wt 82.8 lb

## 2019-09-08 DIAGNOSIS — I10 Essential (primary) hypertension: Secondary | ICD-10-CM | POA: Diagnosis not present

## 2019-09-08 DIAGNOSIS — J449 Chronic obstructive pulmonary disease, unspecified: Secondary | ICD-10-CM

## 2019-09-08 DIAGNOSIS — F411 Generalized anxiety disorder: Secondary | ICD-10-CM

## 2019-09-08 DIAGNOSIS — I6523 Occlusion and stenosis of bilateral carotid arteries: Secondary | ICD-10-CM | POA: Diagnosis not present

## 2019-09-08 DIAGNOSIS — R0789 Other chest pain: Secondary | ICD-10-CM | POA: Diagnosis not present

## 2019-09-08 MED ORDER — LORAZEPAM 0.5 MG PO TABS: ORAL_TABLET | ORAL | 2 refills | Status: DC

## 2019-09-08 NOTE — Progress Notes (Signed)
[facility] [place], [place] [address]  Internal MEDICINE  Office Visit Note  Patient Name: Christina Hester  [id number]  [id number]  Date of Service: 09/10/2019  Chief Complaint  Patient presents with  . Hypertension  . Hyperlipidemia  . COPD    The patient Is here for follow up visit. She states that she has a strange pain, just to the left of the midline of the belly. Hurts only when she moves in specific directions. Will "catch" and cause her to lose her breath for a few seconds. Only lasts for a few minutes, then resolves on it's own. Has been going on for about 6 months. She states that her breathing is well controlled, overall. Oxygen saturations are good. Blood pressure is slightly elevated today, howver, it is generally well controlled with current medication. Most recent chest x-ray done 06/2019 and was without osseous or cardiopulmonary abnormalities.       Current Medication: Outpatient Encounter Medications as of 09/08/2019  Medication Sig  . amLODipine (NORVASC) 5 MG tablet Take one tab po qhs  . buPROPion (WELLBUTRIN SR) 100 MG 12 hr tablet Take 1 tablet (100 mg total) by mouth daily.  . clopidogrel (PLAVIX) 75 MG tablet Take 1 tablet (75 mg total) by mouth daily.  . clotrimazole (MYCELEX) 10 MG troche DISSOLVE 1 TABLET BY MOUTH 5 TIMES DAILY  . FLOVENT HFA 110 MCG/ACT inhaler INHALE 2 PUFFS INTO THE LUNGS TWO TIMES DAILY  . ipratropium-albuterol (DUONEB) 0.5-2.5 (3) MG/3ML SOLN Take 3 mLs by nebulization every 4 (four) hours.  Marland Kitchen LORazepam (ATIVAN) 0.5 MG tablet TAKE 1/2 TO 1 TABLET BY MOUTH AT NIGHT  . nitroGLYCERIN (NITROSTAT) 0.4 MG SL tablet Place 1 tablet (0.4 mg total) under the tongue every 5 (five) minutes as needed for chest pain.  Marland Kitchen ondansetron (ZOFRAN ODT) 4 MG disintegrating tablet Take 1 tablet (4 mg total) by mouth every 8 (eight) hours as needed for nausea or vomiting.  . Tiotropium Bromide Monohydrate (SPIRIVA RESPIMAT) 2.5  MCG/ACT AERS Inhale 1 puff into the lungs daily.  . traMADol (ULTRAM) 50 MG tablet Take 1 tablet (50 mg total) by mouth every 8 (eight) hours as needed.  . VENTOLIN HFA 108 (90 Base) MCG/ACT inhaler INHALE 2 PUFFS INTO THE LUNGS EVERY 6 HOURS AS NEEDED FOR WHEEZING ORSHORTNESS OF BREATH  . [DISCONTINUED] LORazepam (ATIVAN) 0.5 MG tablet TAKE 1/2 TO 1 TABLET BY MOUTH AT NIGHT  . [DISCONTINUED] azithromycin (ZITHROMAX) 250 MG tablet Take as directed. (Patient not taking: Reported on 09/08/2019)  . [DISCONTINUED] benzonatate (TESSALON) 200 MG capsule Take 1 capsule (200 mg total) by mouth 2 (two) times daily as needed for cough. (Patient not taking: Reported on 09/08/2019)  . [DISCONTINUED] methylPREDNISolone (MEDROL) 4 MG tablet Take 1 tablet (4 mg total) by mouth daily. (Patient not taking: Reported on 09/08/2019)  . [DISCONTINUED] nystatin (MYCOSTATIN) 100000 UNIT/ML suspension Take 5 mLs (500,000 Units total) by mouth 4 (four) times daily. (Patient not taking: Reported on 09/08/2019)  . [DISCONTINUED] Promethazine-Codeine 6.25-10 MG/5ML SOLN Take 5 mLs by mouth 3 (three) times daily as needed. (Patient not taking: Reported on 09/08/2019)   No facility-administered encounter medications on file as of 09/08/2019.    Surgical History: Past Surgical History:  Procedure Laterality Date  . ABDOMINAL HYSTERECTOMY    . APPENDECTOMY    . BREAST EXCISIONAL BIOPSY Left 80's or 90's   NEG  . COLONOSCOPY N/A 05/22/2018   Procedure: COLONOSCOPY;  Surgeon: Lin Landsman,  MD;  Location: [facility] ENDOSCOPY;  Service: Gastroenterology;  Laterality: N/A;  . LEFT HEART CATH Bilateral 03/05/2017   Procedure: Left Heart Cath with poss PCI;  Surgeon: Wellington Hampshire, MD;  Location: [place] [facility];  Service: Cardiovascular;  Laterality: Bilateral;    Medical History: Past Medical History:  Diagnosis Date  . AAA (abdominal aortic aneurysm) ([place])    a. 05/2016 Abd U/S: 3.02 x 3.02 AAA.  Marland Kitchen CAD (coronary  artery disease)    a. 02/2017 NSTEMI/Cath: LM nl, LAD mild dzs, D2 min irregs, LCX mild dzs, OM2 50ost, RCA 95p, 171m, L-->R collats, EF 55-65%.  . Cancer ([place])   . COPD (chronic obstructive pulmonary disease) ([place])   . Diastolic dysfunction    a. 02/2017 Echo: EF 55-60%, gr1 DD, ? inf HK, mild MR.  Marland Kitchen Hx of basal cell carcinoma    back   . 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 Hyperlipidemia   . Hypertension   . PAD (peripheral artery disease) ([place])    a. 05/2016 ABI's: R 1.22, L 1.11.  . Tobacco abuse     Family History: Family History  Problem Relation Age of Onset  . Breast cancer Mother   . Dementia Mother   . Hypertension Mother   . Breast cancer Maternal Aunt   . Breast cancer Maternal Aunt   . Breast cancer Maternal Aunt   . CVA Father     Social History   Socioeconomic History  . Marital status: Divorced    Spouse name: Not on file  . Number of children: Not on file  . Years of education: Not on file  . Highest education level: Not on file  Occupational History  . Not on file  Tobacco Use  . Smoking status: Former Smoker    Packs/day: 0.25    Quit date: 07/12/2017    Years since quitting: 2.1  . Smokeless tobacco: Never Used  . Tobacco comment: currently 90 days without smoking  Substance and Sexual Activity  . Alcohol use: No  . Drug use: No  . Sexual activity: Not on file  Other Topics Concern  . Not on file  Social History Narrative  . Not on file   Social Determinants of Health   Financial Resource Strain:   . Difficulty of Paying Living Expenses: Not on file  Food Insecurity:   . Worried About Charity fundraiser in the Last Year: Not on file  . Ran Out of Food in the Last Year: Not on file  Transportation Needs:   . Lack of Transportation (Medical): Not on file  . Lack of Transportation (Non-Medical): Not on file  Physical Activity:   . Days of Exercise per Week: Not on file  . Minutes of Exercise per  Session: Not on file  Stress:   . Feeling of Stress : Not on file  Social Connections:   . Frequency of Communication with Friends and Family: Not on file  . Frequency of Social Gatherings with Friends and Family: Not on file  . Attends Religious Services: Not on file  . Active Member of Clubs or Organizations: Not on file  . Attends Archivist Meetings: Not on file  . Marital Status: Not on file  Intimate Partner Violence:   . Fear of Current or Ex-Partner: Not on file  . Emotionally Abused: Not on file  . Physically Abused: Not on file  . Sexually Abused: Not on  file      Review of Systems  Constitutional: Negative for activity change, chills, fatigue and unexpected weight change.  HENT: Negative for congestion, postnasal drip, rhinorrhea, sneezing and sore throat.   Respiratory: Positive for shortness of breath and wheezing. Negative for cough and chest tightness.        Shortness of breath with exertion.   Cardiovascular: Negative for chest pain and palpitations.       Tenderness of the left lower rib cage. Hurts more with movement in certain directions. Hurts only for a few seconds then resolves.   Gastrointestinal: Negative for abdominal pain, constipation, diarrhea, nausea and vomiting.  Endocrine: Negative for cold intolerance, heat intolerance, polydipsia and polyuria.  Musculoskeletal: Positive for arthralgias and back pain. Negative for joint swelling and neck pain.       Tenderness along the left lower rib cage. Very intermittent. Hurts only in certain positions or with certain movements.   Skin: Negative for rash.  Allergic/Immunologic: Negative for environmental allergies.  Neurological: Negative for dizziness, tremors, weakness, numbness and headaches.  Hematological: Negative for adenopathy. Does not bruise/bleed easily.  Psychiatric/Behavioral: Positive for dysphoric mood. Negative for behavioral problems (Depression), sleep disturbance and suicidal ideas.  The patient is nervous/anxious.        Well managed on current medication.    Today's Vitals   09/08/19 1037  BP: (!) 151/77  Pulse: 91  Resp: 16  Temp: (!) 97.4 F (36.3 C)  SpO2: 97%  Weight: 82 lb 12.8 oz (37.6 kg)  Height: 5' (1.524 m)   Body mass index is 16.17 kg/m.  Physical Exam Vitals and nursing note reviewed.  Constitutional:      General: She is not in acute distress.    Appearance: Normal appearance. She is well-developed. She is not diaphoretic.  HENT:     Head: Normocephalic and atraumatic.     Nose: Nose normal.     Mouth/Throat:     Pharynx: No oropharyngeal exudate.  Eyes:     Pupils: Pupils are equal, round, and reactive to light.  Neck:     Thyroid: No thyromegaly.     Vascular: Carotid bruit present. No JVD.     Trachea: No tracheal deviation.     Comments: Very soft carotid bruit auscultated on right side of neck.  Cardiovascular:     Rate and Rhythm: Normal rate and regular rhythm.     Heart sounds: Murmur present. No friction rub. No gallop.   Pulmonary:     Effort: Pulmonary effort is normal. No respiratory distress.     Breath sounds: Normal breath sounds. No wheezing or rales.     Comments: Tenderness of the left lower rib cage. There is some swelling present. No crepitus or evidence of bony deformity is palpable at this time.  Chest:     Chest wall: Tenderness present.     Breasts:        Right: Normal. No swelling, bleeding, inverted nipple, mass, nipple discharge, skin change or tenderness.        Left: Normal. No swelling, bleeding, inverted nipple, mass, nipple discharge, skin change or tenderness.  Abdominal:     General: Bowel sounds are normal.     Palpations: Abdomen is soft.  Musculoskeletal:        General: Normal range of motion.     Cervical back: Normal range of motion and neck supple.  Lymphadenopathy:     Cervical: No cervical adenopathy.  Skin:    General: Skin  is warm and dry.     Capillary Refill: Capillary refill  takes 2 to 3 seconds.  Neurological:     General: No focal deficit present.     Mental Status: She is alert and oriented to person, place, and time.     Cranial Nerves: No cranial nerve deficit.  Psychiatric:        Attention and Perception: Attention and perception normal.        Mood and Affect: Affect normal. Mood is anxious.        Speech: Speech normal.        Behavior: Behavior normal. Behavior is cooperative.        Thought Content: Thought content normal.        Cognition and Memory: Cognition normal.        Judgment: Judgment normal.    Assessment/Plan:  1. COPD with chronic bronchitis and emphysema (HCC) Stable. conitnue inhalers and respiratory treatments as prescribed  2. Costochondral pain May take tylenol as needed for pain. Encouraged her to use low heating pad on effected area to reduce pain and inflammation.   3. Benign hypertension bp stable. Continue bp medication as prescribed   4. Generalized anxiety disorder Continue wellbutrin as prescribed. May take lorazepam 0.5mg  at night as needed for acute anxiety/insomnia. New prescription sent to pharmacy today.  - LORazepam (ATIVAN) 0.5 MG tablet; TAKE 1/2 TO 1 TABLET BY MOUTH AT NIGHT  Dispense: 30 tablet; Refill: 2  5. Bilateral carotid artery stenosis conitnue plavix as prescribed. Patient to reschedule missed appointment with vein and vascular. Appointment was cancelled due to inclement weather.   General Counseling: Leiyah verbalizes understanding of the findings of todays visit and agrees with plan of treatment. I have discussed any further diagnostic evaluation that may be needed or ordered today. We also reviewed her medications today. she has been encouraged to call the office with any questions or concerns that should arise related to todays visit.   Hypertension Counseling:   The following hypertensive lifestyle modification were recommended and discussed:  1. Limiting alcohol intake to less than 1 oz/day  of ethanol:(24 oz of beer or 8 oz of wine or 2 oz of 100-proof whiskey). 2. Take baby ASA 81 mg daily. 3. Importance of regular aerobic exercise and losing weight. 4. Reduce dietary saturated fat and cholesterol intake for overall cardiovascular health. 5. Maintaining adequate dietary potassium, calcium, and magnesium intake. 6. Regular monitoring of the blood pressure. 7. Reduce sodium intake to less than 100 mmol/day (less than 2.3 gm of sodium or less than 6 gm of sodium choride)   This patient was seen by [place] with Dr Lavera Guise as a part of collaborative care agreement  Meds ordered this encounter  Medications  . LORazepam (ATIVAN) 0.5 MG tablet    Sig: TAKE 1/2 TO 1 TABLET BY MOUTH AT NIGHT    Dispense:  30 tablet    Refill:  2    Order Specific Question:   Supervising Provider    Answer:   Lavera Guise [id number]    Total time spent: 30 Minutes  Time spent includes review of chart, medications, test results, and follow up plan with the patient.      Dr Lavera Guise Internal medicine

## 2019-09-10 DIAGNOSIS — R0789 Other chest pain: Secondary | ICD-10-CM | POA: Insufficient documentation

## 2019-09-24 ENCOUNTER — Encounter (INDEPENDENT_AMBULATORY_CARE_PROVIDER_SITE_OTHER): Payer: Self-pay | Admitting: Nurse Practitioner

## 2019-09-24 ENCOUNTER — Ambulatory Visit (INDEPENDENT_AMBULATORY_CARE_PROVIDER_SITE_OTHER): Payer: Medicare Other

## 2019-09-24 ENCOUNTER — Ambulatory Visit (INDEPENDENT_AMBULATORY_CARE_PROVIDER_SITE_OTHER): Payer: Medicare Other | Admitting: Nurse Practitioner

## 2019-09-24 ENCOUNTER — Other Ambulatory Visit: Payer: Self-pay

## 2019-09-24 VITALS — BP 160/74 | HR 88 | Resp 16 | Ht 60.0 in | Wt 81.0 lb

## 2019-09-24 DIAGNOSIS — I739 Peripheral vascular disease, unspecified: Secondary | ICD-10-CM

## 2019-09-24 DIAGNOSIS — I6523 Occlusion and stenosis of bilateral carotid arteries: Secondary | ICD-10-CM | POA: Diagnosis not present

## 2019-09-24 DIAGNOSIS — I714 Abdominal aortic aneurysm, without rupture, unspecified: Secondary | ICD-10-CM

## 2019-09-25 NOTE — Progress Notes (Signed)
SUBJECTIVE:  Patient ID: Christina Hester, female    DOB: 1941-07-12, 79 y.o.   MRN: [id number] Chief Complaint  Patient presents with  . Follow-up    ultrasound follow up     HPI  Christina Hester is a 79 y.o. 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 The carotid stenosis followed by ultrasound.   The patient denies amaurosis fugax. There is no recent history of TIA symptoms or focal motor deficits. There is no prior documented CVA.  The patient is taking enteric-coated aspirin 81 mg daily.  There is no history of migraine headaches. There is no history of seizures.  The patient has a history of coronary artery disease, no recent episodes of angina or shortness of breath. The patient denies PAD or claudication symptoms. There is a history of hyperlipidemia which is being treated with a statin.    Carotid Duplex done today shows 80-99% of the right internal carotid artery with 40 to 59% stenosis of the left internal carotid artery.. There is an increase compared to the previous study on 08/20/2018.  The patient returns to the office for surveillance of a known abdominal aortic aneurysm. Patient denies abdominal pain or back pain, no other abdominal complaints. No changes suggesting embolic episodes.  There have been no interval changes in lower extremity symptoms. No interval shortening of the patient's claudication distance or development of rest pain symptoms. No new ulcers or wounds have occurred since the last visit.  Duplex US of the aorta and iliac arteries shows an AAA measured 3.2 cm with no iliac artery aneurysms.   ABI Rt=0.94 and Lt=0.82  (previous ABI's Rt=0.93 and Lt=0.75) Duplex ultrasound of the left tibial arteries reveals triphasic waveforms with good toe waveforms, the right tibial arteries has monophasic/triphasic waveforms with good toe waveforms.  Past Medical History:  Diagnosis Date  . AAA (abdominal aortic aneurysm) ([place])    a.  05/2016 Abd U/S: 3.02 x 3.02 AAA.  Marland Kitchen CAD (coronary artery disease)    a. 02/2017 NSTEMI/Cath: LM nl, LAD mild dzs, D2 min irregs, LCX mild dzs, OM2 50ost, RCA 95p, 138m, L-->R collats, EF 55-65%.  . Cancer ([place])   . COPD (chronic obstructive pulmonary disease) ([place])   . Diastolic dysfunction    a. 02/2017 Echo: EF 55-60%, gr1 DD, ? inf HK, mild MR.  Marland Kitchen Hx of basal cell carcinoma    back   . 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 Hyperlipidemia   . Hypertension   . PAD (peripheral artery disease) ([place])    a. 05/2016 ABI's: R 1.22, L 1.11.  . Tobacco abuse     Past Surgical History:  Procedure Laterality Date  . ABDOMINAL HYSTERECTOMY    . APPENDECTOMY    . BREAST EXCISIONAL BIOPSY Left 80's or 90's   NEG  . COLONOSCOPY N/A 05/22/2018   Procedure: COLONOSCOPY;  Surgeon: Lin Landsman, MD;  Location: [facility] ENDOSCOPY;  Service: Gastroenterology;  Laterality: N/A;  . LEFT HEART CATH Bilateral 03/05/2017   Procedure: Left Heart Cath with poss PCI;  Surgeon: Wellington Hampshire, MD;  Location: [facility];  Service: Cardiovascular;  Laterality: Bilateral;    Social History   Socioeconomic History  . Marital status: Divorced    Spouse name: Not on file  . Number of children: Not on file  . Years of education: Not on file  . Highest education level: Not on file  Occupational History  . Not on file  Tobacco Use  . Smoking status: Former Smoker    Packs/day: 0.25    Quit date: 07/12/2017    Years since quitting: 2.2  . Smokeless tobacco: Never Used  . Tobacco comment: currently 90 days without smoking  Substance and Sexual Activity  . Alcohol use: No  . Drug use: No  . Sexual activity: Not on file  Other Topics Concern  . Not on file  Social History Narrative  . Not on file   Social Determinants of Health   Financial Resource Strain:   . Difficulty of Paying Living Expenses:   Food Insecurity:   . Worried About Charity fundraiser  in the Last Year:   . Arboriculturist in the Last Year:   Transportation Needs:   . Film/video editor (Medical):   Marland Kitchen Lack of Transportation (Non-Medical):   Physical Activity:   . Days of Exercise per Week:   . Minutes of Exercise per Session:   Stress:   . Feeling of Stress :   Social Connections:   . Frequency of Communication with Friends and Family:   . Frequency of Social Gatherings with Friends and Family:   . Attends Religious Services:   . Active Member of Clubs or Organizations:   . Attends Archivist Meetings:   Marland Kitchen Marital Status:   Intimate Partner Violence:   . Fear of Current or Ex-Partner:   . Emotionally Abused:   Marland Kitchen Physically Abused:   . Sexually Abused:     Family History  Problem Relation Age of Onset  . Breast cancer Mother   . Dementia Mother   . Hypertension Mother   . Breast cancer Maternal Aunt   . Breast cancer Maternal Aunt   . Breast cancer Maternal Aunt   . CVA Father     Allergies  Allergen Reactions  . Aspirin Tinitus  . Prednisone Other (See Comments)    Makes violent  . Sulfa Antibiotics Nausea And Vomiting  . Symbicort [Budesonide-Formoterol Fumarate] Other (See Comments)    Patient felt "out of her mind" and angry.     Review of Systems   Review of Systems: Negative Unless Checked Constitutional: [] Weight loss  [] Fever  [] Chills Cardiac: [] Chest pain   []  Atrial Fibrillation  [] Palpitations   [] Shortness of breath when laying flat   [] Shortness of breath with exertion. [] Shortness of breath at rest Vascular:  [] Pain in legs with walking   [] Pain in legs with standing [] Pain in legs when laying flat   [] Claudication    [] Pain in feet when laying flat    [] History of DVT   [] Phlebitis   [] Swelling in legs   [] Varicose veins   [] Non-healing ulcers Pulmonary:   [] Uses home oxygen   [] Productive cough   [] Hemoptysis   [] Wheeze  [x] COPD   [] Asthma Neurologic:  [] Dizziness   [] Seizures  [] Blackouts [] History of stroke    [] History of TIA  [] Aphasia   [] Temporary Blindness   [] Weakness or numbness in arm   [] Weakness or numbness in leg Musculoskeletal:   [] Joint swelling   [] Joint pain   [] Low back pain  []  History of Knee Replacement [x] Arthritis [] back Surgeries  []  Spinal Stenosis    Hematologic:  [] Easy bruising  [] Easy bleeding   [] Hypercoagulable state   [] Anemic Gastrointestinal:  [] Diarrhea   [] Vomiting  [] Gastroesophageal reflux/heartburn   [] Difficulty swallowing. [] Abdominal pain Genitourinary:  [] Chronic kidney disease   [] Difficult urination  []   Anuric   [] Blood in urine [] Frequent urination  [] Burning with urination   [] Hematuria Skin:  [] Rashes   [] Ulcers [] Wounds Psychological:  [x] History of anxiety   []  History of major depression  []  Memory Difficulties      OBJECTIVE:   Physical Exam  BP (!) 160/74 (BP Location: Right Arm)   Pulse 88   Resp 16   Ht 5' (1.524 m)   Wt 81 lb (36.7 kg)   BMI 15.82 kg/m   Gen: WD/WN, NAD Head: [place]/AT, No temporalis wasting.  Ear/Nose/Throat: Hearing grossly intact, nares w/o erythema or drainage Eyes: PER, EOMI, sclera nonicteric.  Neck: Supple, no masses.  No JVD.  Pulmonary:  Good air movement, no use of accessory muscles.  Cardiac: RRR Vascular:  No carotid bruit Vessel Right Left  Radial Palpable Palpable  Dorsalis Pedis Palpable Palpable  Posterior Tibial Palpable Palpable   Gastrointestinal: soft, non-distended. No guarding/no peritoneal signs.  Musculoskeletal: M/S 5/5 throughout.  No deformity or atrophy.  Neurologic: Pain and light touch intact in extremities.  Symmetrical.  Speech is fluent. Motor exam as listed above. Psychiatric: Judgment intact, Mood & affect appropriate for pt's clinical situation. Dermatologic: No Venous rashes. No Ulcers Noted.  No changes consistent with cellulitis. Lymph : No Cervical lymphadenopathy, no lichenification or skin changes of chronic lymphedema.       ASSESSMENT AND PLAN:  1. Bilateral carotid  artery stenosis The patient remains asymptomatic with respect to the carotid stenosis.  However, the patient has now progressed and has a lesion the is >70%.  Patient should undergo CT angiography of the carotid arteries to define the degree of stenosis of the internal carotid arteries bilaterally and the anatomic suitability for surgery vs. intervention.  If the patient does indeed need surgery cardiac clearance will be required, once cleared the patient will be scheduled for surgery.  The risks, benefits and alternative therapies were reviewed in detail with the patient.  All questions were answered.  The patient agrees to proceed with imaging.  Continue antiplatelet therapy as prescribed. Continue management of CAD, HTN and Hyperlipidemia. Healthy heart diet, encouraged exercise at least 4 times per week.   - CT ANGIO HEAD W OR WO CONTRAST; Future - CT ANGIO NECK W OR WO CONTRAST; Future  2. AAA (abdominal aortic aneurysm) without rupture (H[facility]) No surgery or intervention at this time. The patient has an asymptomatic abdominal aortic aneurysm that is less than 4 cm in maximal diameter.  I have discussed the natural history of abdominal aortic aneurysm and the small risk of rupture for aneurysm less than 5 cm in size.  However, as these small aneurysms tend to enlarge over time, continued surveillance with ultrasound or CT scan is mandatory.  I have also discussed optimizing medical management with hypertension and lipid control and the importance of abstinence from tobacco.  The patient is also encouraged to exercise a minimum of 30 minutes 4 times a week.  Should the patient develop new onset abdominal or back pain or signs of peripheral embolization they are instructed to seek medical attention immediately and to alert the physician providing care that they have an aneurysm.  The patient voices their understanding. The patient will return in 12 months with an aortic duplex.   3. PVD  (peripheral vascular disease) ([place])  Recommend:  The patient has evidence of atherosclerosis of the lower extremities with claudication.  The patient does not voice lifestyle limiting changes at this point in time.  Noninvasive studies do  not suggest clinically significant change.  No invasive studies, angiography or surgery at this time The patient should continue walking and begin a more formal exercise program.  The patient should continue antiplatelet therapy and aggressive treatment of the lipid abnormalities  No changes in the patient's medications at this time  The patient should continue wearing graduated compression socks 10-15 mmHg strength to control the mild edema.   Patient will follow up in 1 year with noninvasive studies   Current Outpatient Medications on File Prior to Visit  Medication Sig Dispense Refill  . amLODipine (NORVASC) 5 MG tablet Take one tab po qhs 90 tablet 3  . buPROPion (WELLBUTRIN SR) 100 MG 12 hr tablet Take 1 tablet (100 mg total) by mouth daily. 90 tablet 3  . clopidogrel (PLAVIX) 75 MG tablet Take 1 tablet (75 mg total) by mouth daily. 90 tablet 3  . clotrimazole (MYCELEX) 10 MG troche DISSOLVE 1 TABLET BY MOUTH 5 TIMES DAILY 70 tablet 0  . FLOVENT HFA 110 MCG/ACT inhaler INHALE 2 PUFFS INTO THE LUNGS TWO TIMES DAILY 12 g 3  . ipratropium-albuterol (DUONEB) 0.5-2.5 (3) MG/3ML SOLN Take 3 mLs by nebulization every 4 (four) hours. 360 mL 3  . LORazepam (ATIVAN) 0.5 MG tablet TAKE 1/2 TO 1 TABLET BY MOUTH AT NIGHT 30 tablet 2  . nitroGLYCERIN (NITROSTAT) 0.4 MG SL tablet Place 1 tablet (0.4 mg total) under the tongue every 5 (five) minutes as needed for chest pain. 20 tablet 12  . ondansetron (ZOFRAN ODT) 4 MG disintegrating tablet Take 1 tablet (4 mg total) by mouth every 8 (eight) hours as needed for nausea or vomiting. 20 tablet 0  . Tiotropium Bromide Monohydrate (SPIRIVA RESPIMAT) 2.5 MCG/ACT AERS Inhale 1 puff into the lungs daily. 30 Inhaler 5  .  traMADol (ULTRAM) 50 MG tablet Take 1 tablet (50 mg total) by mouth every 8 (eight) hours as needed. 15 tablet 0  . VENTOLIN HFA 108 (90 Base) MCG/ACT inhaler INHALE 2 PUFFS INTO THE LUNGS EVERY 6 HOURS AS NEEDED FOR WHEEZING ORSHORTNESS OF BREATH 18 g 3   No current facility-administered medications on file prior to visit.    There are no Patient Instructions on file for this visit. No follow-ups on file.   Kris Hartmann, NP  This note was completed with Sales executive.  Any errors are purely unintentional.

## 2019-10-17 ENCOUNTER — Other Ambulatory Visit: Payer: Medicare Other

## 2019-10-17 ENCOUNTER — Ambulatory Visit
Admission: RE | Admit: 2019-10-17 | Discharge: 2019-10-17 | Disposition: A | Discharge status: Home / Self Care | Payer: Medicare Other | Source: Ambulatory Visit | Attending: Nurse Practitioner | Admitting: Nurse Practitioner

## 2019-10-17 ENCOUNTER — Other Ambulatory Visit: Payer: Self-pay

## 2019-10-17 DIAGNOSIS — I6523 Occlusion and stenosis of bilateral carotid arteries: Secondary | ICD-10-CM | POA: Insufficient documentation

## 2019-10-17 LAB — POCT I-STAT CREATININE: Creatinine, Ser: 0.7 mg/dL (ref 0.44–1.00)

## 2019-10-17 MED ORDER — IOHEXOL 350 MG/ML SOLN
60.0000 mL | Freq: Once | INTRAVENOUS | Status: AC | PRN
  Administered 2019-10-17: 60 mL via INTRAVENOUS

## 2019-10-20 NOTE — Progress Notes (Signed)
Could we have her scheduled to see Dew to review CT scan. Thanks

## 2019-10-21 ENCOUNTER — Ambulatory Visit (INDEPENDENT_AMBULATORY_CARE_PROVIDER_SITE_OTHER): Payer: Medicare Other | Admitting: Vascular Surgery

## 2019-10-21 ENCOUNTER — Encounter (INDEPENDENT_AMBULATORY_CARE_PROVIDER_SITE_OTHER): Payer: Self-pay | Admitting: Vascular Surgery

## 2019-10-21 ENCOUNTER — Other Ambulatory Visit: Payer: Self-pay

## 2019-10-21 VITALS — BP 185/84 | HR 105 | Resp 16 | Ht 60.0 in | Wt 82.0 lb

## 2019-10-21 DIAGNOSIS — I2583 Coronary atherosclerosis due to lipid rich plaque: Secondary | ICD-10-CM | POA: Diagnosis not present

## 2019-10-21 DIAGNOSIS — I714 Abdominal aortic aneurysm, without rupture, unspecified: Secondary | ICD-10-CM

## 2019-10-21 DIAGNOSIS — I6523 Occlusion and stenosis of bilateral carotid arteries: Secondary | ICD-10-CM | POA: Diagnosis not present

## 2019-10-21 DIAGNOSIS — I10 Essential (primary) hypertension: Secondary | ICD-10-CM | POA: Diagnosis not present

## 2019-10-21 DIAGNOSIS — I739 Peripheral vascular disease, unspecified: Secondary | ICD-10-CM

## 2019-10-21 DIAGNOSIS — I2510 Atherosclerotic heart disease of native coronary artery without angina pectoris: Secondary | ICD-10-CM | POA: Diagnosis not present

## 2019-10-21 NOTE — Patient Instructions (Signed)
Carotid Endarterectomy A carotid endarterectomy is a surgery to remove a blockage in the carotid arteries. The carotid arteries are the large blood vessels on both sides of the neck that supply blood to the brain. Carotid artery disease, also called carotid artery stenosis, is the narrowing or blockage of one or both carotid arteries. Carotid artery disease is usually caused by atherosclerosis, which is a buildup of fat and plaque in the arteries. Some buildup of plaque normally occurs with aging. The plaque may partially or totally block blood flow or cause a clot to form in the carotid arteries. This may cause a stroke. Tell a health care provider about:  Any allergies you have.  All medicines you are taking, including vitamins, herbs, eye drops, creams, and over-the-counter medicines.  Any problems you or family members have had with anesthetic medicines.  Any blood disorders you have.  Any surgeries you have had.  Any medical conditions you have, or have had, including diabetes, kidney problems, and infections.  Whether you are pregnant or may be pregnant. What are the risks? Generally, this is a safe procedure. However, problems may occur, including:  Infection.  Bleeding.  Blood clots.  Allergic reactions to medicines.  Damage to nerves near the carotid arteries. This can cause a hoarse voice or weakness of muscles in your face.  Stroke.  Seizures.  Heart attack (myocardial infarction).  Narrowing of the opened blood vessel (restenosis). This may require another surgery. What happens before the procedure? Staying hydrated Follow instructions from your health care provider about hydration, which may include:  Up to 2 hours before the procedure - you may continue to drink clear liquids, such as water, clear fruit juice, black coffee, and plain tea.  Eating and drinking restrictions Follow instructions from your health care provider about eating and drinking, which may  include:  8 hours before the procedure - stop eating heavy meals or foods, such as meat, fried foods, or fatty foods.  6 hours before the procedure - stop eating light meals or foods, such as toast or cereal.  6 hours before the procedure - stop drinking milk or drinks that contain milk.  2 hours before the procedure - stop drinking clear liquids. Medicines  Ask your health care provider about: ? Changing or stopping your regular medicines. This is especially important if you are taking diabetes medicines or blood thinners. ? Taking medicines such as aspirin and ibuprofen. These medicines can thin your blood. Do not take these medicines unless your health care provider tells you to take them. ? Taking over-the-counter medicines, vitamins, herbs, and supplements. General instructions  Do not use any products that contain nicotine or tobacco for at least 4-6 weeks, or as soon as possible, before the procedure. These products include cigarettes, e-cigarettes, and chewing tobacco. If you need help quitting, ask your health care provider.  You may need to have blood tests, a test to check heart rhythm (electrocardiogram), or a test to check blood flow (angiogram).  Plan to have someone take you home from the hospital or clinic.  Ask your health care provider: ? How your surgery site will be marked. ? What steps will be taken to help prevent infection. These may include:  Removing hair at the surgery site.  Washing skin with a germ-killing soap.  Taking antibiotic medicine. What happens during the procedure?   An IV will be inserted into one of your veins.  You will be given one or more of the following: ?   A medicine to help you relax (sedative). ? A medicine to make you fall asleep (general anesthetic).  The surgeon will make a small incision in your neck to expose the carotid artery.  A tube may be inserted into the carotid artery above and below the blockage. This tube will  allow blood to flow around the blockage during the surgery.  An incision will be made in the carotid artery at the location of the blockage.  The blockage will be removed. In some cases, a section of the carotid artery may be removed and a graft patch may be used to repair the artery.  The carotid artery will be closed with stitches (sutures).  If a tube was inserted into the artery to allow blood flow around the blockage during surgery, the tube will be removed. Once the tube is removed, blood flow through the carotid artery will be restored.  The incision in the neck will be closed with sutures.  A bandage (dressing) will be placed over your incision. The procedure may vary among health care providers and hospitals. What happens after the procedure?  Your blood pressure, heart rate, breathing rate, and blood oxygen level will be monitored until you leave the hospital or clinic.  You may have some pain or an ache in your neck for about 2 weeks. This is normal.  Do not drive for 24 hours if you were given a sedative during your procedure. Summary  A carotid endarterectomy is a surgery to remove a blockage in the carotid arteries.  The carotid arteries are the large blood vessels on both sides of the neck that supply blood to the brain.  Before the procedure, ask your health care provider about changing or stopping your regular medicines.  Follow instructions from your health care provider about eating and drinking before the procedure.  After the procedure, do not drive for 24 hours if you were given a sedative. This information is not intended to replace advice given to you by your health care provider. Make sure you discuss any questions you have with your health care provider. Document Revised: 03/12/2018 Document Reviewed: 03/12/2018 Elsevier Patient Education  2020 [employer]  

## 2019-10-21 NOTE — Assessment & Plan Note (Signed)
She has undergone a CT angiogram which I have independently reviewed.  The official report by Dr. Carlis Abbott suggested a 55% right ICA stenosis and a 50% left ICA stenosis.  This would represent a significant underestimate of the right carotid artery stenosis by my interpretation.  I have reached out to Dr. Kathlene Cote who is the main vascular radiologist here at [facility].  His estimate was more of a 70 to 75% stenosis of the right internal carotid artery which was more in line for my interpretation.  This would also correlate with better with her carotid duplex which suggested a high-grade right carotid artery stenosis. At this point with progression of disease up into a high-grade range and having progressed over time, I think would be reasonable to go ahead and proceed with right carotid endarterectomy.  Her anatomy is favorable for such.  I have discussed the risks and benefits of the procedure.  I have discussed the reason and rationale for treatment and the general reduction in stroke risk with intervention for lesions of 70% or above.  She voices her understanding and is agreeable to proceed with right carotid endarterectomy.

## 2019-10-21 NOTE — Assessment & Plan Note (Signed)
blood pressure control important in reducing the progression of atherosclerotic disease. On appropriate oral medications.  

## 2019-10-21 NOTE — Assessment & Plan Note (Signed)
Has been seen by cardiology.  No current worrisome cardiac symptoms.  If anesthesia desires, cardiac clearance can be obtained but no intervention was recommended previously.

## 2019-10-21 NOTE — Progress Notes (Signed)
MRN : [id number]  Christina Hester is a 79 y.o. (25-Aug-1940) female who presents with chief complaint of  Chief Complaint  Patient presents with  . Follow-up    est pt review ct results   .  History of Present Illness: Patient returns today in follow up of her carotid disease.  She remains asymptomatic from a cerebrovascular standpoint. Specifically, the patient denies amaurosis fugax, speech or swallowing difficulties, or arm or leg weakness or numbness.  She has undergone a CT angiogram which I have independently reviewed.  The official report by Dr. Carlis Abbott suggested a 55% right ICA stenosis and a 50% left ICA stenosis.  This would represent a significant underestimate of the right carotid artery stenosis by my interpretation.  I have reached out to Dr. Kathlene Cote who is the main vascular radiologist here at [facility].  His estimate was more of a 70 to 75% stenosis of the right internal carotid artery which was more in line for my interpretation.  This would also correlate with better with her carotid duplex which suggested a high-grade right carotid artery stenosis.  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  Her daughter accompanies her today who has already had a carotid endarterectomy and the patient is also familiar with this as her mother had a carotid endarterectomy in the past.  Current Outpatient Medications  Medication Sig Dispense Refill  . amLODipine (NORVASC) 5 MG tablet Take one tab po qhs 90 tablet 3  . buPROPion (WELLBUTRIN SR) 100 MG 12 hr tablet Take 1 tablet (100 mg total) by mouth daily. 90 tablet 3  . clopidogrel (PLAVIX) 75 MG tablet Take 1 tablet (75 mg total) by mouth daily. 90 tablet 3  . clotrimazole (MYCELEX) 10 MG troche DISSOLVE 1 TABLET BY MOUTH 5 TIMES DAILY 70 tablet 0  . FLOVENT HFA 110 MCG/ACT inhaler INHALE 2 PUFFS INTO THE LUNGS TWO TIMES DAILY 12 g 3  . ipratropium-albuterol  (DUONEB) 0.5-2.5 (3) MG/3ML SOLN Take 3 mLs by nebulization every 4 (four) hours. 360 mL 3  . LORazepam (ATIVAN) 0.5 MG tablet TAKE 1/2 TO 1 TABLET BY MOUTH AT NIGHT 30 tablet 2  . nitroGLYCERIN (NITROSTAT) 0.4 MG SL tablet Place 1 tablet (0.4 mg total) under the tongue every 5 (five) minutes as needed for chest pain. 20 tablet 12  . ondansetron (ZOFRAN ODT) 4 MG disintegrating tablet Take 1 tablet (4 mg total) by mouth every 8 (eight) hours as needed for nausea or vomiting. 20 tablet 0  . Tiotropium Bromide Monohydrate (SPIRIVA RESPIMAT) 2.5 MCG/ACT AERS Inhale 1 puff into the lungs daily. 30 Inhaler 5  . traMADol (ULTRAM) 50 MG tablet Take 1 tablet (50 mg total) by mouth every 8 (eight) hours as needed. 15 tablet 0  . VENTOLIN HFA 108 (90 Base) MCG/ACT inhaler INHALE 2 PUFFS INTO THE LUNGS EVERY 6 HOURS AS NEEDED FOR WHEEZING ORSHORTNESS OF BREATH 18 g 3   No current facility-administered medications for this visit.    Past Medical History:  Diagnosis Date  . AAA (abdominal aortic aneurysm) ([place])    a. 05/2016 Abd U/S: 3.02 x 3.02 AAA.  Marland Kitchen CAD (coronary artery disease)    a. 02/2017 NSTEMI/Cath: LM nl, LAD mild dzs, D2 min irregs, LCX mild dzs, OM2 50ost, RCA 95p, 123m, L-->R collats, EF 55-65%.  Marland Kitchen COPD (chronic obstructive pulmonary disease) ([place])   . Diastolic dysfunction  a. 02/2017 Echo: EF 55-60%, gr1 DD, ? inf HK, mild MR.  Marland Kitchen Hx of basal cell carcinoma    back   . 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 Hyperlipidemia   . Hypertension   . PAD (peripheral artery disease) ([place])    a. 05/2016 ABI's: R 1.22, L 1.11.  . Tobacco abuse     Past Surgical History:  Procedure Laterality Date  . ABDOMINAL HYSTERECTOMY    . APPENDECTOMY    . BREAST EXCISIONAL BIOPSY Left 80's or 90's   NEG  . COLONOSCOPY N/A 05/22/2018   Procedure: COLONOSCOPY;  Surgeon: Lin Landsman, MD;  [facility] END[facility]COPY;  Service: Gastroenterology;  Laterality: N/A;  .  LEFT HEART CATH Bilateral 03/05/2017   Procedure: Left Heart Cath with poss PCI;  Surgeon: Wellington Hampshire, MD;  Location: [facility];  Service: Cardiovascular;  Laterality: Bilateral;     Social History   Tobacco Use  . Smoking status: Former Smoker    Packs/day: 0.25    Quit date: 07/12/2017    Years since quitting: 2.2  . Smokeless tobacco: Never Used  . Tobacco comment: currently 90 days without smoking  Substance Use Topics  . Alcohol use: No  . Drug use: No    Family History  Problem Relation Age of Onset  . Breast cancer Mother   . Dementia Mother   . Hypertension Mother   . Breast cancer Maternal Aunt   . Breast cancer Maternal Aunt   . Breast cancer Maternal Aunt   . CVA Father      Allergies  Allergen Reactions  . Aspirin Tinitus  . Prednisone Other (See Comments)    Makes violent  . Sulfa Antibiotics Nausea And Vomiting  . Symbicort [Budesonide-Formoterol Fumarate] Other (See Comments)    Patient felt "out of her mind" and angry.     REVIEW OF SYSTEMS (Negative unless checked)  Constitutional: [] Weight loss  [] Fever  [] Chills Cardiac: [] Chest pain   [] Chest pressure   [] Palpitations   [] Shortness of breath when laying flat   [] Shortness of breath at rest   [] Shortness of breath with exertion. Vascular:  [] Pain in legs with walking   [] Pain in legs at rest   [] Pain in legs when laying flat   [] Claudication   [] Pain in feet when walking  [] Pain in feet at rest  [] Pain in feet when laying flat   [] History of DVT   [] Phlebitis   [] Swelling in legs   [] Varicose veins   [] Non-healing ulcers Pulmonary:   [] Uses home oxygen   [] Productive cough   [] Hemoptysis   [] Wheeze  [x] COPD   [] Asthma Neurologic:  [] Dizziness  [] Blackouts   [] Seizures   [] History of stroke   [] History of TIA  [] Aphasia   [] Temporary blindness   [] Dysphagia   [] Weakness or numbness in arms   [] Weakness or numbness in legs Musculoskeletal:  [x] Arthritis   [] Joint swelling   [x] Joint  pain   [] Low back pain Hematologic:  [] Easy bruising  [] Easy bleeding   [] Hypercoagulable state   [] Anemic   Gastrointestinal:  [] Blood in stool   [] Vomiting blood  [] Gastroesophageal reflux/heartburn   [] Abdominal pain Genitourinary:  [] Chronic kidney disease   [] Difficult urination  [] Frequent urination  [] Burning with urination   [] Hematuria Skin:  [] Rashes   [] Ulcers   [] Wounds Psychological:  [x] History of anxiety   []  History of major depression.  Physical Examination  BP (!) 185/84 (  BP Location: Right Arm)   Pulse (!) 105   Resp 16   Ht 5' (1.524 m)   Wt 82 lb (37.2 kg)   BMI 16.01 kg/m  Gen:  WD/WN, NAD Head: [place]/AT, No temporalis wasting. Ear/Nose/Throat: Hearing grossly intact, nares w/o erythema or drainage Eyes: Conjunctiva clear. Sclera non-icteric Neck: Supple.  Trachea midline.  Right carotid bruit Pulmonary:  Good air movement, no use of accessory muscles.  Cardiac: RRR, no JVD Vascular:  Vessel Right Left  Radial Palpable Palpable       Musculoskeletal: M/S 5/5 throughout.  No deformity or atrophy.  No significant lower extremity edema. Neurologic: Sensation grossly intact in extremities.  Symmetrical.  Speech is fluent.  Psychiatric: Judgment intact, Mood & affect appropriate for pt's clinical situation. Dermatologic: No rashes or ulcers noted.  No cellulitis or open wounds.       Labs Recent Results (from the past 2160 hour(s))  I-STAT creatinine     Status: None   Collection Time: 10/17/19  2:40 PM  Result Value Ref Range   Creatinine, Ser 0.70 0.44 - 1.00 mg/dL    Radiology CT ANGIO HEAD W OR WO CONTRAST  Result Date: 10/17/2019 CLINICAL DATA:  Carotid stenosis. EXAM: CT ANGIOGRAPHY HEAD AND NECK TECHNIQUE: Multidetector CT imaging of the head and neck was performed using the standard protocol during bolus administration of intravenous contrast. Multiplanar CT image reconstructions and MIPs were obtained to evaluate the vascular anatomy. Carotid  stenosis measurements (when applicable) are obtained utilizing NASCET criteria, using the distal internal carotid diameter as the denominator. CONTRAST:  18mL OMNIPAQUE IOHEXOL 350 MG/ML SOLN COMPARISON:  None. FINDINGS: CT HEAD FINDINGS Brain: Ventricles and cerebral volume normal in volume. Patchy white matter hypodensity bilaterally most consistent with chronic microvascular ischemia. Negative for acute infarct, hemorrhage, mass. Vascular: Negative for hyperdense vessel Skull: Negative Sinuses: Right sphenoid sinus mass 20 x 15 mm. This is well-circumscribed and hyperintense hyperdense. This may represent a complex cyst or polyp. No bony destruction. Orbits: Negative Review of the MIP images confirms the above findings CTA NECK FINDINGS Aortic arch: Atherosclerotic calcification aortic arch. Three-vessel arch. 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 Scattered atherosclerotic disease throughout the left subclavian artery without significant stenosis. Right carotid system: Mild atherosclerotic calcification mid right common carotid artery. Atherosclerotic calcification right carotid bifurcation and proximal right internal carotid artery. Focal stenosis proximal right internal carotid artery with minimal lumen diameter 1.9 mm corresponding to 55% diameter stenosis. Left carotid system: Mild atherosclerotic disease left common carotid artery without significant stenosis. Complex calcified and noncalcified plaque left carotid bifurcation. Minimal lumen diameter proximal left internal carotid artery 2.3 mm corresponding to 50% diameter stenosis. Vertebral arteries: Moderate to severe stenosis origin of right vertebral artery due to noncalcified plaque. Remainder of the right vertebral artery is widely patent to the basilar. Calcific plaque with moderate stenosis at the origin of the left vertebral artery. Mild stenosis mid left vertebral artery. Left vertebral artery is  then patent to the basilar. Skeleton: Cervical spondylosis.  No acute skeletal abnormality. Other neck: Negative for mass or adenopathy. Upper chest: Moderately severe apical emphysema bilaterally without acute abnormality. Review of the MIP images confirms the above findings CTA HEAD FINDINGS Anterior circulation: Atherosclerotic calcification in the cavernous carotid bilaterally without significant stenosis. Anterior and middle cerebral arteries patent bilaterally without significant stenosis or large vessel occlusion. Posterior circulation: Both vertebral arteries patent to the basilar. PICA patent bilaterally. Basilar widely patent. Superior cerebellar and posterior  cerebral arteries patent bilaterally. Venous sinuses: Normal venous enhancement. Anatomic variants: None Review of the MIP images confirms the above findings IMPRESSION: 1. No acute intracranial abnormality. Chronic microvascular ischemic change in the white matter 2. Well-circumscribed hyperdense mass in the right sphenoid sinus likely a complex retention cyst or polyp. 3. Atherosclerotic disease right carotid bifurcation with 55% diameter stenosis proximal right internal carotid artery 4. Calcified and noncalcified plaque left carotid bifurcation with 50% diameter stenosis proximal left internal carotid artery 5. Moderate to severe stenosis origin right vertebral artery and moderate stenosis origin of left vertebral artery. 6. 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 Electronically Signed   By: Franchot Gallo M.D.   On: 10/17/2019 15:35   CT ANGIO NECK W OR WO CONTRAST  Result Date: 10/17/2019 CLINICAL DATA:  Carotid stenosis. EXAM: CT ANGIOGRAPHY HEAD AND NECK TECHNIQUE: Multidetector CT imaging of the head and neck was performed using the standard protocol during bolus administration of intravenous contrast. Multiplanar CT image reconstructions and MIPs were obtained to evaluate the vascular  anatomy. Carotid stenosis measurements (when applicable) are obtained utilizing NASCET criteria, using the distal internal carotid diameter as the denominator. CONTRAST:  66mL OMNIPAQUE IOHEXOL 350 MG/ML SOLN COMPARISON:  None. FINDINGS: CT HEAD FINDINGS Brain: Ventricles and cerebral volume normal in volume. Patchy white matter hypodensity bilaterally most consistent with chronic microvascular ischemia. Negative for acute infarct, hemorrhage, mass. Vascular: Negative for hyperdense vessel Skull: Negative Sinuses: Right sphenoid sinus mass 20 x 15 mm. This is well-circumscribed and hyperintense hyperdense. This may represent a complex cyst or polyp. No bony destruction. Orbits: Negative Review of the MIP images confirms the above findings CTA NECK FINDINGS Aortic arch: Atherosclerotic calcification aortic arch. Three-vessel arch. 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 Scattered atherosclerotic disease throughout the left subclavian artery without significant stenosis. Right carotid system: Mild atherosclerotic calcification mid right common carotid artery. Atherosclerotic calcification right carotid bifurcation and proximal right internal carotid artery. Focal stenosis proximal right internal carotid artery with minimal lumen diameter 1.9 mm corresponding to 55% diameter stenosis. Left carotid system: Mild atherosclerotic disease left common carotid artery without significant stenosis. Complex calcified and noncalcified plaque left carotid bifurcation. Minimal lumen diameter proximal left internal carotid artery 2.3 mm corresponding to 50% diameter stenosis. Vertebral arteries: Moderate to severe stenosis origin of right vertebral artery due to noncalcified plaque. Remainder of the right vertebral artery is widely patent to the basilar. Calcific plaque with moderate stenosis at the origin of the left vertebral artery. Mild stenosis mid left vertebral artery. Left  vertebral artery is then patent to the basilar. Skeleton: Cervical spondylosis.  No acute skeletal abnormality. Other neck: Negative for mass or adenopathy. Upper chest: Moderately severe apical emphysema bilaterally without acute abnormality. Review of the MIP images confirms the above findings CTA HEAD FINDINGS Anterior circulation: Atherosclerotic calcification in the cavernous carotid bilaterally without significant stenosis. Anterior and middle cerebral arteries patent bilaterally without significant stenosis or large vessel occlusion. Posterior circulation: Both vertebral arteries patent to the basilar. PICA patent bilaterally. Basilar widely patent. Superior cerebellar and posterior cerebral arteries patent bilaterally. Venous sinuses: Normal venous enhancement. Anatomic variants: None Review of the MIP images confirms the above findings IMPRESSION: 1. No acute intracranial abnormality. Chronic microvascular ischemic change in the white matter 2. Well-circumscribed hyperdense mass in the right sphenoid sinus likely a complex retention cyst or polyp. 3. Atherosclerotic disease right carotid bifurcation with 55% diameter stenosis proximal right internal carotid artery 4.  Calcified and noncalcified plaque left carotid bifurcation with 50% diameter stenosis proximal left internal carotid artery 5. Moderate to severe stenosis origin right vertebral artery and moderate stenosis origin of left vertebral artery. 6. 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 Electronically Signed   By: Franchot Gallo M.D.   On: 10/17/2019 15:35   VAS [place] ABI WITH/WO TBI  Result Date: 10/03/2019 LOWER EXTREMITY DOPPLER STUDY  Comparison Study: 08/20/2018 Performing Technologist: Charlane Ferretti RT (R)(VS)  Examination Guidelines: A complete evaluation includes at minimum, Doppler waveform signals and systolic blood pressure reading at the level of bilateral brachial, anterior tibial, and  posterior tibial arteries, when vessel segments are accessible. Bilateral testing is considered an integral part of a complete examination. Photoelectric Plethysmograph (PPG) waveforms and toe systolic pressure readings are included as required and additional duplex testing as needed. Limited examinations for reoccurring indications may be performed as noted.  ABI Findings: +---------+------------------+-----+----------+--------+ Right    Rt Pressure (mmHg)IndexWaveform  Comment  +---------+------------------+-----+----------+--------+ Brachial 169                                       +---------+------------------+-----+----------+--------+ ATA      143               0.85 monophasic         +---------+------------------+-----+----------+--------+ PTA      159               0.94 triphasic          +---------+------------------+-----+----------+--------+ Great Toe99                0.59 Abnormal           +---------+------------------+-----+----------+--------+ +---------+------------------+-----+---------+-------+ Left     Lt Pressure (mmHg)IndexWaveform Comment +---------+------------------+-----+---------+-------+ Brachial 159                                     +---------+------------------+-----+---------+-------+ ATA      135               0.80 triphasic        +---------+------------------+-----+---------+-------+ PTA      139               0.82 triphasic        +---------+------------------+-----+---------+-------+ Great Toe62                0.37 Normal           +---------+------------------+-----+---------+-------+ +-------+-----------+-----------+------------+------------+ ABI/TBIToday's ABIToday's TBIPrevious ABIPrevious TBI +-------+-----------+-----------+------------+------------+ Right  .94        .59        .93         .14          +-------+-----------+-----------+------------+------------+ Left   .82        .37        .75          .38          +-------+-----------+-----------+------------+------------+ Bilateral ABIs appear essentially unchanged compared to prior study on 08/20/2018. Summary: Right: Resting right ankle-brachial index indicates mild right lower extremity arterial disease. The right toe-brachial index is abnormal. Right TBI appears increased from the previous exam on 08/20/2018. Left: Resting left ankle-brachial index indicates mild left lower extremity arterial disease. The left toe-brachial index is abnormal. Left TBI appears essentially unchanged from  the previous exam on 08/20/2018. *See table(s) above for measurements and observations.  Electronically signed by Leotis Pain MD on 10/03/2019 at 8:47:58 AM.   Final    AAA Duplex  Result Date: 10/03/2019 ABDOMINAL AORTA STUDY Limitations: Air/bowel gas.  Comparison Study: 08/20/2018 Performing Technologist: Charlane Ferretti RT (R)(VS)  Examination Guidelines: A complete evaluation includes B-mode imaging, spectral Doppler, color Doppler, and power Doppler as needed of all accessible portions of each vessel. Bilateral testing is considered an integral part of a complete examination. Limited examinations for reoccurring indications may be performed as noted.  Abdominal Aorta Findings: +-----------+-------+----------+----------+--------+--------+--------+ Location   AP (cm)Trans (cm)PSV (cm/s)WaveformThrombusComments +-----------+-------+----------+----------+--------+--------+--------+ Proximal   2.40   2.60      40                                 +-----------+-------+----------+----------+--------+--------+--------+ Mid        1.85   2.28      57                                 +-----------+-------+----------+----------+--------+--------+--------+ Distal     3.01   3.21      47                                 +-----------+-------+----------+----------+--------+--------+--------+ RT CIA Prox1.6    1.6       73                                  +-----------+-------+----------+----------+--------+--------+--------+ LT CIA Prox0.9    0.9       184                                +-----------+-------+----------+----------+--------+--------+--------+  Summary: Abdominal Aorta: There is evidence of abnormal dilatation of the distal Abdominal aorta. The largest aortic measurement is 3.2 cm. The largest aortic diameter remains essentially unchanged compared to prior exam. Previous diameter measurement was 3.2 cm obtained on 08/20/2018.  *See table(s) above for measurements and observations.  Electronically signed by Leotis Pain MD on 10/03/2019 at 8:48:00 AM.   Final    VAS US CAROTID  Result Date: 10/03/2019 Carotid Arterial Duplex Study Indications:       CVD. Comparison Study:  08/20/2018 Performing Technologist: Charlane Ferretti RT (R)(VS)  Examination Guidelines: A complete evaluation includes B-mode imaging, spectral Doppler, color Doppler, and power Doppler as needed of all accessible portions of each vessel. Bilateral testing is considered an integral part of a complete examination. Limited examinations for reoccurring indications may be performed as noted.  Right Carotid Findings: +----------+--------+--------+--------+---------------------+------------------+           PSV cm/sEDV cm/sStenosisPlaque Description   Comments           +----------+--------+--------+--------+---------------------+------------------+ CCA Prox  72      12              calcific and  irregular                               +----------+--------+--------+--------+---------------------+------------------+ CCA Mid   106     22                                   intimal thickening +----------+--------+--------+--------+---------------------+------------------+ CCA Distal87      20              calcific and         intimal thickening                                   irregular                                +----------+--------+--------+--------+---------------------+------------------+ ICA Prox  411     118             calcific and         ICA/CCA ratio 4.72                                   irregular                               +----------+--------+--------+--------+---------------------+------------------+ ICA Mid   240     55              calcific and         tortuous                                             irregular                               +----------+--------+--------+--------+---------------------+------------------+ ICA Distal87      19                                                      +----------+--------+--------+--------+---------------------+------------------+ ECA       108     8                                                       +----------+--------+--------+--------+---------------------+------------------+ +----------+--------+-------+--------+-------------------+           PSV cm/sEDV cmsDescribeArm Pressure (mmHG) +----------+--------+-------+--------+-------------------+ [id number]                                        +----------+--------+-------+--------+-------------------+ +---------+--------+--+--------+--+ VertebralPSV cm/s71EDV cm/s16 +---------+--------+--+--------+--+ Left Carotid Findings: +----------+--------+--------+--------+--------------------+-------------------+           PSV cm/sEDV cm/sStenosisPlaque Description  Comments            +----------+--------+--------+--------+--------------------+-------------------+ CCA Prox  103     26              calcific and        intimal thickening                                    irregular                               +----------+--------+--------+--------+--------------------+-------------------+ CCA Mid   129     22              calcific                                 +----------+--------+--------+--------+--------------------+-------------------+ CCA Distal158     38              calcific and        intimal thickening                                    irregular                               +----------+--------+--------+--------+--------------------+-------------------+ ICA Prox  192     48              calcific            ICA/CCA ratio =                                                           1.50                +----------+--------+--------+--------+--------------------+-------------------+ ICA Mid   129     33                                                      +----------+--------+--------+--------+--------------------+-------------------+ ICA Distal110     34                                                      +----------+--------+--------+--------+--------------------+-------------------+ ECA       146     25                                  intimal thickening  +----------+--------+--------+--------+--------------------+-------------------+ +----------+--------+--------+--------+-------------------+           PSV cm/sEDV cm/sDescribeArm Pressure (mmHG) +----------+--------+--------+--------+-------------------+ [id number]                                         +----------+--------+--------+--------+-------------------+ +---------+--------+--+--------+--+  VertebralPSV cm/s68EDV cm/s17 +---------+--------+--+--------+--+ Summary: Right Carotid: Velocities in the right ICA are consistent with a 80-99%                stenosis. Non-hemodynamically significant plaque <50% noted in                the CCA. The ECA appears <50% stenosed. Left Carotid: Velocities in the left ICA are consistent with a 40-59% stenosis.               Non-hemodynamically significant plaque <50% noted in the CCA. The               ECA appears <50% stenosed. Vertebrals:  Bilateral vertebral arteries demonstrate antegrade flow.  Subclavians: Normal flow hemodynamics were seen in bilateral subclavian              arteries. *See table(s) above for measurements and observations.  Electronically signed by Leotis Pain MD on 10/03/2019 at 8:48:03 AM.    Final     Assessment/Plan  Benign hypertension blood pressure control important in reducing the progression of atherosclerotic disease. On appropriate oral medications.   PVD (peripheral vascular disease) ([place]) Recently checked and stable.  No disabling claudication symptoms or limb threatening symptoms.  To be followed with noninvasive studies in the future.  AAA (abdominal aortic aneurysm) without rupture ([facility]) Recently checked and stable.  Being checked annually.  Coronary artery disease Has been seen by cardiology.  No current worrisome cardiac symptoms.  If anesthesia desires, cardiac clearance can be obtained but no intervention was recommended previously.  Carotid stenosis She has undergone a CT angiogram which I have independently reviewed.  The official report by Dr. Carlis Abbott suggested a 55% right ICA stenosis and a 50% left ICA stenosis.  This would represent a significant underestimate of the right carotid artery stenosis by my interpretation.  I have reached out to Dr. Kathlene Cote who is the main vascular radiologist here at [facility].  His estimate was more of a 70 to 75% stenosis of the right internal carotid artery which was more in line for my interpretation.  This would also correlate with better with her carotid duplex which suggested a high-grade right carotid artery stenosis. At this point with progression of disease up into a high-grade range and having progressed over time, I think would be reasonable to go ahead and proceed with right carotid endarterectomy.  Her anatomy is favorable for such.  I have discussed the risks and benefits of the procedure.  I have discussed the reason and rationale for treatment and the general reduction in stroke risk with intervention for  lesions of 70% or above.  She voices her understanding and is agreeable to proceed with right carotid endarterectomy.    Leotis Pain, MD  10/21/2019 4:10 PM    This note was created with Dragon medical transcription system.  Any errors from dictation are purely unintentional

## 2019-10-21 NOTE — Assessment & Plan Note (Signed)
Recently checked and stable.  Being checked annually.

## 2019-10-21 NOTE — Assessment & Plan Note (Signed)
Recently checked and stable.  No disabling claudication symptoms or limb threatening symptoms.  To be followed with noninvasive studies in the future.

## 2019-10-24 ENCOUNTER — Encounter: Payer: Self-pay | Admitting: Family

## 2019-10-24 ENCOUNTER — Ambulatory Visit (INDEPENDENT_AMBULATORY_CARE_PROVIDER_SITE_OTHER): Payer: Medicare Other | Admitting: Family

## 2019-10-24 ENCOUNTER — Other Ambulatory Visit: Payer: Self-pay

## 2019-10-24 VITALS — BP 150/80 | HR 84 | Ht 60.0 in | Wt 82.2 lb

## 2019-10-24 DIAGNOSIS — I10 Essential (primary) hypertension: Secondary | ICD-10-CM

## 2019-10-24 DIAGNOSIS — Z01810 Encounter for preprocedural cardiovascular examination: Secondary | ICD-10-CM | POA: Diagnosis not present

## 2019-10-24 DIAGNOSIS — I6523 Occlusion and stenosis of bilateral carotid arteries: Secondary | ICD-10-CM

## 2019-10-24 DIAGNOSIS — I739 Peripheral vascular disease, unspecified: Secondary | ICD-10-CM

## 2019-10-24 DIAGNOSIS — I2510 Atherosclerotic heart disease of native coronary artery without angina pectoris: Secondary | ICD-10-CM | POA: Diagnosis not present

## 2019-10-24 DIAGNOSIS — Z01818 Encounter for other preprocedural examination: Secondary | ICD-10-CM

## 2019-10-24 NOTE — Patient Instructions (Addendum)
Medication Instructions:  NO medication changes today.  *If you need a refill on your cardiac medications before your next appointment, please call your pharmacy*   Lab Work: No lab work today.  If you have labs (blood work) drawn today and your tests are completely normal, you will receive your results only by: Marland Kitchen MyChart Message (if you have MyChart) OR . A paper copy in the mail If you have any lab test that is abnormal or we need to change your treatment, we will call you to review the results.   Testing/Procedures: Your EKG today showed normal simus rhythm which is a great result.  Follow-Up: At [facility], you and your health needs are our priority.  As part of our continuing mission to provide you with exceptional heart care, we have created designated Provider Care Teams.  These Care Teams include your primary Cardiologist (physician) and Advanced Practice Providers (APPs -  Physician Assistants and Nurse Practitioners) who all work together to provide you with the care you need, when you need it.  We recommend signing up for the patient portal called "MyChart".  Sign up information is provided on this After Visit Summary.  MyChart is used to connect with patients for Virtual Visits (Telemedicine).  Patients are able to view lab/test results, encounter notes, upcoming appointments, etc.  Non-urgent messages can be sent to your provider as well.   To learn more about what you can do with MyChart, go to [email or web address].    Your next appointment:   1 year(s)  The format for your next appointment:   In Person  Provider:   You may see Kathlyn Sacramento, MD or one of the following Advanced Practice Providers on your designated Care Team:    Murray Hodgkins, NP  Christell Faith, PA-C  Marrianne Mood, PA-C  Other Instructions   We will send a note to Dr. Lucky Cowboy that you are cleared for surgery. Your EKG looked great today! No worries about new or worsening blockages.     If your blood pressure is consistently more than 130/80, please let us or Dr. Humphrey Rolls know.

## 2019-10-24 NOTE — Progress Notes (Signed)
Office Visit    Patient Name: Christina Hester Date of Encounter: 10/24/2019  Primary Care Provider:  Lavera Guise, MD Primary Cardiologist:  Kathlyn Sacramento, MD Electrophysiologist:  None   Chief Complaint    Christina Hester is a 79 y.o. female with a hx of  CAD, COPD, carotid disease, AAA followed by [facility], tobacco abuse presents today for cardiac clearance for right carotid endarterectomy.   Past Medical History    Past Medical History:  Diagnosis Date  . AAA (abdominal aortic aneurysm) ([place])    a. 05/2016 Abd U/S: 3.02 x 3.02 AAA.  Marland Kitchen CAD (coronary artery disease)    a. 02/2017 NSTEMI/Cath: LM nl, LAD mild dzs, D2 min irregs, LCX mild dzs, OM2 50ost, RCA 95p, 152m, L-->R collats, EF 55-65%.  Marland Kitchen COPD (chronic obstructive pulmonary disease) ([place])   . Diastolic dysfunction    a. 02/2017 Echo: EF 55-60%, gr1 DD, ? inf HK, mild MR.  Marland Kitchen Hx of basal cell carcinoma    back   . 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 Hyperlipidemia   . Hypertension   . PAD (peripheral artery disease) ([place])    a. 05/2016 ABI's: R 1.22, L 1.11.  . Tobacco abuse    Past Surgical History:  Procedure Laterality Date  . ABDOMINAL HYSTERECTOMY    . APPENDECTOMY    . BREAST EXCISIONAL BIOPSY Left 80's or 90's   NEG  . CARDIAC CATHETERIZATION    . COLONOSCOPY N/A 05/22/2018   Procedure: COLONOSCOPY;  Surgeon: Lin Landsman, MD;  Location: [facility] ENDOSCOPY;  Service: Gastroenterology;  Laterality: N/A;  . LEFT HEART CATH Bilateral 03/05/2017   Procedure: Left Heart Cath with poss PCI;  Surgeon: Wellington Hampshire, MD;  Location: [facility] LAB;  Service: Cardiovascular;  Laterality: Bilateral;    Allergies  Allergies  Allergen Reactions  . Aspirin Tinitus  . Prednisone Other (See Comments)    Makes violent  . Sulfa Antibiotics Nausea And Vomiting  . Symbicort [Budesonide-Formoterol Fumarate] Other (See Comments)    Patient felt "out of her mind" and angry.     History of Present Illness    Christina Hester is a 79 y.o. female with a hx of CAD, COPD, carotid disease, AAA followed by [facility], tobacco abuse last seen by Dr. Fletcher Anon 10/11/2017.  Cardiac catheterization 02/2017 with severe one-vessel CAD with chronically occluded RCA with well developed left-to-right collaterals. Mild to moderate nonobstructiev disease to left coronary arteries with extremely tortuous vessels. EF normal with mildly elevated LVEDP.   Hx of intolerance to aspirin due to ringing in her ears. Tolerates Plavix. Declines statins due to fear of side effects. Trialed Zeta, but reported leg pain.   Seen by Dr. Lucky Cowboy 10/21/19. She had a CT angiogram with official report suggesting 55% right ICA stenosis and 50% left ICA stenosis. However, on review by Dr. Lucky Cowboy and by Dr. Kathlene Cote (main vascular radiologist at [facility]) estimate 70-75% stenosis of right ICA. Recent carotid duplex suggestive of high grade right carotid artery stenosis.   Reports no chest pain, pressure, tightness. Tells me every once in awhile she will get a "catch" just below her rib cage if she has a strong coughing fit which she attributes to her COPD.   Walks her dogs in the morning. This does not cause chest pain nor shortness of breath. Reports no SOB nor DOE.   BP at home routinely 130/69. Tells me it is  never higher than 130. Had previous problems with hypotension and stopped her Lisinopril. She remains on Amlodipine.   Endorses eating a heart healthy diet.   EKGs/Labs/Other Studies Reviewed:   The following studies were reviewed today: 02/2017 LHC  The left ventricular systolic function is normal.  LV end diastolic pressure is mildly elevated.  The left ventricular ejection fraction is 55-65% by visual estimate.  Prox RCA lesion, 95 %stenosed.  Mid RCA lesion, 100 %stenosed.  Ost 2nd Mrg to 2nd Mrg lesion, 50 %stenosed.   1. Severe one-vessel coronary artery disease with chronically occluded right coronary  artery with well-developed left-to-right collaterals. Mild to moderate nonobstructive disease affecting the left system with extremely tortuous coronary arteries. 2. Normal LV systolic function and mildly elevated left ventricular end-diastolic pressure.   Recommendations: Medical therapy.    EKG:  EKG is  ordered today.  The ekg ordered today demonstrates SR 84 bpm with no St/T wave changes.  Recent Labs: 10/17/2019: Creatinine, Ser 0.70  Recent Lipid Panel    Component Value Date/Time   CHOL 253 (H) 01/07/2018 0858   TRIG 104 01/07/2018 0858   HDL 94 01/07/2018 0858   CHOLHDL 2.6 11/26/2015 0412   VLDL 6 11/26/2015 0412   LDLCALC 138 (H) 01/07/2018 0858    Home Medications   Current Meds  Medication Sig  . amLODipine (NORVASC) 5 MG tablet Take one tab po qhs  . buPROPion (WELLBUTRIN SR) 100 MG 12 hr tablet Take 1 tablet (100 mg total) by mouth daily.  . clopidogrel (PLAVIX) 75 MG tablet Take 1 tablet (75 mg total) by mouth daily.  . clotrimazole (MYCELEX) 10 MG troche DISSOLVE 1 TABLET BY MOUTH 5 TIMES DAILY (Patient taking differently: as needed. DISSOLVE 1 TABLET BY MOUTH 5 TIMES DAILY)  . ipratropium-albuterol (DUONEB) 0.5-2.5 (3) MG/3ML SOLN Take 3 mLs by nebulization every 4 (four) hours.  Marland Kitchen LORazepam (ATIVAN) 0.5 MG tablet TAKE 1/2 TO 1 TABLET BY MOUTH AT NIGHT  . nitroGLYCERIN (NITROSTAT) 0.4 MG SL tablet Place 1 tablet (0.4 mg total) under the tongue every 5 (five) minutes as needed for chest pain.  Marland Kitchen ondansetron (ZOFRAN ODT) 4 MG disintegrating tablet Take 1 tablet (4 mg total) by mouth every 8 (eight) hours as needed for nausea or vomiting.  . VENTOLIN HFA 108 (90 Base) MCG/ACT inhaler INHALE 2 PUFFS INTO THE LUNGS EVERY 6 HOURS AS NEEDED FOR WHEEZING ORSHORTNESS OF BREATH      Review of Systems      Review of Systems  Constitution: Negative for chills, fever and malaise/fatigue.  Cardiovascular: Negative for chest pain, dyspnea on exertion, irregular heartbeat,  leg swelling, near-syncope, orthopnea, palpitations and syncope.  Respiratory: Negative for cough, shortness of breath and wheezing.   Gastrointestinal: Negative for melena, nausea and vomiting.  Genitourinary: Negative for hematuria.  Neurological: Negative for dizziness, light-headedness and weakness.   All other systems reviewed and are otherwise negative except as noted above.  Physical Exam    VS:  BP (!) 150/80 (BP Location: Left Arm, Patient Position: Sitting, Cuff Size: Normal)   Pulse 84   Ht 5' (1.524 m)   Wt 82 lb 4 oz (37.3 kg)   SpO2 95%   BMI 16.06 kg/m  , BMI Body mass index is 16.06 kg/m. GEN: Well nourished, well developed, in no acute distress. HEENT: normal. Neck: Supple, no JVD or masses. (+) right carotid bruit Cardiac: RRR, no murmurs, rubs, or gallops. No clubbing, cyanosis, edema.  Radials/DP/PT 2+ and equal  bilaterally.  Respiratory:  Respirations regular and unlabored, clear to auscultation bilaterally. GI: Soft, nontender, nondistended, BS + x 4. MS: No deformity or atrophy. Skin: Warm and dry, no rash. Neuro:  Strength and sensation are intact. Psych: Normal affect.  Accessory Clinical Findings    ECG personally reviewed by me today - SR 84 bpm with no ST/T wave changes - no acute changes.  Assessment & Plan    1. Preoperative clearance - Upcoming right carotid endarterectomy with Dr. Lucky Cowboy. Surgery date not yet set. She has hx of single vessel CAD to RCA with established collaterals at time of cath 2018. No recurrent anginal symptoms. EKG today SR 84 bpm with no ST/T wave changes. According to the Revised Cardiac Risk Index (RCRI), her Perioperative Risk of Major Cardiac Event is (%): 0.9. Her Functional Capacity in METs is: 7.59 according to the Duke Activity Status Index (DASI). She is deemed appropriate cardiovascular risk for the planned procedure and may proceed without additional cardiovascular testing. This note will be routed to Dr. Lucky Cowboy via the  Epic fax function.   2. CAD - Stable with no anginal symptoms. No indication for ischemic evaluation at this time. GDMT includes Plavix (intolerant of aspirin). Previous hypotension precludes addition of beta blocker at this time. She declines statins, has intolerance to Zetia, and declines lipid lowering therapy today such as PCSK9i. Could consider Nexletol in future if she is agreeable to trial.   3. Carotid stenosis - Recent CT angiogram with re-read by Dr. Lucky Cowboy and Dr. Kathlene Cote suggestive of 70-75% stenosis of right ICA. Dr. Lucky Cowboy recommended right carotid endarterectomy.   4. HTN - BP mildly elevated in office today. Tells me it is routinely 130/69 at home and had previous difficulties with hypotension. Previously discontinued her Lisinopril, remains on Amlodipine 5mg  daily. Encouraged to report BP consistently >130/80. If BP routinely elevated consider re-addition of Lisinopril 2.5mg  at bedtime or increased dose of Amlodipine to 10mg  daily.   5. PVD - ABI 09/2019 unchanged from previous. No symptoms of claudication. Continues to follow with Dr. Lucky Cowboy of [facility].   6. AAA - Duplex 09/2019 with stable AAA at 3.2 cm.  Follows with Dr. Lucky Cowboy of vascular for annual imaging.   Disposition: Follow up in 1 year(s) with Dr. Tor Netters, NP 10/24/2019, 11:11 AM

## 2019-10-28 ENCOUNTER — Telehealth (INDEPENDENT_AMBULATORY_CARE_PROVIDER_SITE_OTHER): Payer: Self-pay

## 2019-10-28 NOTE — Telephone Encounter (Signed)
Spoke with the patient and she is now scheduled with Dr. Lucky Cowboy for [place] surgery on 11/06/19. Patient will do a phone call pre-op on 10/31/19 between 8-1 pm and covid testing on 11/04/19 between 8-1 pm at the [facility]. Pre-surgical instructions were discussed and will be mailed to the patient.

## 2019-10-30 ENCOUNTER — Other Ambulatory Visit (INDEPENDENT_AMBULATORY_CARE_PROVIDER_SITE_OTHER): Payer: Self-pay | Admitting: Nurse Practitioner

## 2019-10-31 ENCOUNTER — Other Ambulatory Visit: Payer: Self-pay

## 2019-10-31 ENCOUNTER — Encounter
Admission: RE | Admit: 2019-10-31 | Discharge: 2019-10-31 | Disposition: A | Discharge status: Home / Self Care | Payer: Medicare Other | Source: Ambulatory Visit | Attending: Vascular Surgery | Admitting: Vascular Surgery

## 2019-10-31 HISTORY — DX: Anxiety disorder, unspecified: F41.9

## 2019-10-31 NOTE — Patient Instructions (Signed)
Your procedure is scheduled on: 11/06/19 Report to [place]. To find out your arrival time please call [phone] between 1PM - 3PM on 11/05/19.  Remember: Instructions that are not followed completely may result in serious medical risk, up to and including death, or upon the discretion of your surgeon and anesthesiologist your surgery may need to be rescheduled.     _X__ 1. Do not eat food after midnight the night before your procedure.                 No gum chewing or hard candies. You may drink clear liquids up to 2 hours                 before you are scheduled to arrive for your surgery- DO not drink clear                 liquids within 2 hours of the start of your surgery.                 Clear Liquids include:  water, apple juice without pulp, clear carbohydrate                 drink such as Clearfast or Gatorade, Black Coffee or Tea (Do not add                 anything to coffee or tea). Diabetics water only  __X__2.  On the morning of surgery brush your teeth with toothpaste and water, you                 may rinse your mouth with mouthwash if you wish.  Do not swallow any              toothpaste of mouthwash.     _X__ 3.  No Alcohol for 24 hours before or after surgery.   _X__ 4.  Do Not Smoke or use e-cigarettes For 24 Hours Prior to Your Surgery.                 Do not use any chewable tobacco products for at least 6 hours prior to                 surgery.  ____  5.  Bring all medications with you on the day of surgery if instructed.   __X__  6.  Notify your doctor if there is any change in your medical condition      (cold, fever, infections).     Do not wear jewelry, make-up, hairpins, clips or nail polish. Do not wear lotions, powders, or perfumes.  Do not shave 48 hours prior to surgery. Men may shave face and neck. Do not bring valuables to the hospital.    [facility] is not responsible for any belongings or  valuables.  Contacts, dentures/partials or body piercings may not be worn into surgery. Bring a case for your contacts, glasses or hearing aids, a denture cup will be supplied. Leave your suitcase in the car. After surgery it may be brought to your room. For patients admitted to the hospital, discharge time is determined by your treatment team.   Patients discharged the day of surgery will not be allowed to drive home.   Please read over the following fact sheets that you were given:   MRSA Information  __X__ Take these medicines the morning of surgery with A SIP OF WATER:  1. buPROPion (WELLBUTRIN SR) 100 MG 12 hr tablet  2.   3. LORazepam (ATIVAN) 0.5 MG tablet if needed  4.  5.  6.  ____ Fleet Enema (as directed)   __X__ Use CHG Soap/SAGE wipes as directed  __X__ Use inhalers on the day of surgery NEBULIZER TREATMENT  ____ Stop metformin/Janumet/Farxiga 2 days prior to surgery    ____ Take 1/2 of usual insulin dose the night before surgery. No insulin the morning          of surgery.   __X__ Stop [place] Plants  on Saturday AS INSTRUCTED  Or contact your Surgeon, Cardiologist or Medical Doctor regarding  ability to stop your blood thinners  __X__ Stop Anti-inflammatories 7 days before surgery such as Advil, Ibuprofen, Motrin,  BC or Goodies Powder, Naprosyn, Naproxen, Aleve, Aspirin TYLENOL IS OK   __X__ Stop all herbal supplements, fish oil or vitamin E until after surgery.    ____ Bring C-Pap to the hospital.

## 2019-11-04 ENCOUNTER — Other Ambulatory Visit
Admission: RE | Admit: 2019-11-04 | Discharge: 2019-11-04 | Disposition: A | Discharge status: Home / Self Care | Payer: Medicare Other | Source: Ambulatory Visit | Attending: Vascular Surgery | Admitting: Vascular Surgery

## 2019-11-04 ENCOUNTER — Other Ambulatory Visit: Payer: Self-pay

## 2019-11-04 LAB — TYPE AND SCREEN
ABO/RH(D): A POS
Antibody Screen: NEGATIVE

## 2019-11-04 LAB — BASIC METABOLIC PANEL
Anion gap: 8 (ref 5–15)
BUN: 16 mg/dL (ref 8–23)
CO2: 28 mmol/L (ref 22–32)
Calcium: 9.8 mg/dL (ref 8.9–10.3)
Chloride: 101 mmol/L (ref 98–111)
Creatinine, Ser: 0.67 mg/dL (ref 0.44–1.00)
GFR calc Af Amer: >60 mL/min (ref >60)
GFR calc non Af Amer: >60 mL/min (ref >60)
Glucose, Bld: 93 mg/dL (ref 70–99)
Potassium: 4.1 mmol/L (ref 3.5–5.1)
Sodium: 137 mmol/L (ref 135–145)

## 2019-11-04 LAB — CBC WITH DIFFERENTIAL/PLATELET
Abs Immature Granulocytes: 0.02 10*3/uL (ref 0.00–0.07)
Basophils Absolute: 0 10*3/uL (ref 0.0–0.1)
Basophils Relative: 1 %
Eosinophils Absolute: 0.1 10*3/uL (ref 0.0–0.5)
Eosinophils Relative: 3 %
HCT: 37.1 % (ref 36.0–46.0)
Hemoglobin: 12.6 g/dL (ref 12.0–15.0)
Immature Granulocytes: 0 %
Lymphocytes Relative: 37 %
Lymphs Abs: 2 10*3/uL (ref 0.7–4.0)
MCH: 30.4 pg (ref 26.0–34.0)
MCHC: 34 g/dL (ref 30.0–36.0)
MCV: 89.4 fL (ref 80.0–100.0)
Monocytes Absolute: 0.6 10*3/uL (ref 0.1–1.0)
Monocytes Relative: 11 %
Neutro Abs: 2.7 10*3/uL (ref 1.7–7.7)
Neutrophils Relative %: 48 %
Platelets: 242 10*3/uL (ref 150–400)
RBC: 4.15 MIL/uL (ref 3.87–5.11)
RDW: 12.9 % (ref 11.5–15.5)
WBC: 5.5 10*3/uL (ref 4.0–10.5)
nRBC: 0 % (ref 0.0–0.2)

## 2019-11-04 LAB — SARS CORONAVIRUS 2 (TAT 6-24 HRS): SARS Coronavirus 2: NEGATIVE

## 2019-11-04 LAB — APTT: aPTT: 35 seconds (ref 24–36)

## 2019-11-04 LAB — PROTIME-INR
INR: 1 (ref 0.8–1.2)
Prothrombin Time: 13.5 seconds (ref 11.4–15.2)

## 2019-11-06 ENCOUNTER — Encounter: Payer: Self-pay | Admitting: Vascular Surgery

## 2019-11-06 ENCOUNTER — Other Ambulatory Visit: Payer: Self-pay

## 2019-11-06 ENCOUNTER — Inpatient Hospital Stay
Admission: RE | Admit: 2019-11-06 | Discharge: 2019-11-07 | DRG: 039 | Disposition: A | Discharge status: Home / Self Care | Payer: Medicare Other | Attending: Vascular Surgery | Admitting: Vascular Surgery

## 2019-11-06 ENCOUNTER — Inpatient Hospital Stay: Payer: Medicare Other | Admitting: Certified Registered Nurse Anesthetist

## 2019-11-06 ENCOUNTER — Encounter
Admission: RE | Disposition: A | Discharge status: Home / Self Care | Payer: Self-pay | Source: Home / Self Care | Attending: Vascular Surgery

## 2019-11-06 DIAGNOSIS — Z7902 Long term (current) use of antithrombotics/antiplatelets: Secondary | ICD-10-CM

## 2019-11-06 DIAGNOSIS — I739 Peripheral vascular disease, unspecified: Secondary | ICD-10-CM | POA: Diagnosis present

## 2019-11-06 DIAGNOSIS — Z888 Allergy status to other drugs, medicaments and biological substances status: Secondary | ICD-10-CM | POA: Diagnosis not present

## 2019-11-06 DIAGNOSIS — J449 Chronic obstructive pulmonary disease, unspecified: Secondary | ICD-10-CM | POA: Diagnosis present

## 2019-11-06 DIAGNOSIS — I6521 Occlusion and stenosis of right carotid artery: Principal | ICD-10-CM | POA: Diagnosis present

## 2019-11-06 DIAGNOSIS — F419 Anxiety disorder, unspecified: Secondary | ICD-10-CM | POA: Diagnosis present

## 2019-11-06 DIAGNOSIS — Z886 Allergy status to analgesic agent status: Secondary | ICD-10-CM

## 2019-11-06 DIAGNOSIS — Z87891 Personal history of nicotine dependence: Secondary | ICD-10-CM

## 2019-11-06 DIAGNOSIS — Z79899 Other long term (current) drug therapy: Secondary | ICD-10-CM | POA: Diagnosis not present

## 2019-11-06 DIAGNOSIS — Z85828 Personal history of other malignant neoplasm of skin: Secondary | ICD-10-CM | POA: Diagnosis not present

## 2019-11-06 DIAGNOSIS — Z882 Allergy status to sulfonamides status: Secondary | ICD-10-CM

## 2019-11-06 DIAGNOSIS — Z20822 Contact with and (suspected) exposure to covid-19: Secondary | ICD-10-CM | POA: Diagnosis present

## 2019-11-06 DIAGNOSIS — I10 Essential (primary) hypertension: Secondary | ICD-10-CM | POA: Diagnosis present

## 2019-11-06 DIAGNOSIS — I2510 Atherosclerotic heart disease of native coronary artery without angina pectoris: Secondary | ICD-10-CM | POA: Diagnosis not present

## 2019-11-06 DIAGNOSIS — E785 Hyperlipidemia, unspecified: Secondary | ICD-10-CM | POA: Diagnosis present

## 2019-11-06 DIAGNOSIS — I252 Old myocardial infarction: Secondary | ICD-10-CM

## 2019-11-06 DIAGNOSIS — I771 Stricture of artery: Secondary | ICD-10-CM | POA: Diagnosis not present

## 2019-11-06 DIAGNOSIS — J439 Emphysema, unspecified: Secondary | ICD-10-CM | POA: Diagnosis not present

## 2019-11-06 DIAGNOSIS — I714 Abdominal aortic aneurysm, without rupture: Secondary | ICD-10-CM | POA: Diagnosis present

## 2019-11-06 HISTORY — PX: ENDARTERECTOMY: SHX5162

## 2019-11-06 LAB — GLUCOSE, CAPILLARY: Glucose-Capillary: 114 mg/dL — ABNORMAL HIGH (ref 70–99)

## 2019-11-06 SURGERY — ENDARTERECTOMY, CAROTID
Anesthesia: General | Site: Neck | Laterality: Right

## 2019-11-06 MED ORDER — FAMOTIDINE 20 MG PO TABS
ORAL_TABLET | ORAL | Status: AC
  Administered 2019-11-06: 20 mg via ORAL
  Filled 2019-11-06: qty 1

## 2019-11-06 MED ORDER — ONDANSETRON HCL 4 MG/2ML IJ SOLN: 4.0000 mg | Freq: Once | INTRAMUSCULAR | Status: DC | PRN

## 2019-11-06 MED ORDER — IPRATROPIUM-ALBUTEROL 0.5-2.5 (3) MG/3ML IN SOLN
RESPIRATORY_TRACT | Status: AC
  Administered 2019-11-06: 3 mL via RESPIRATORY_TRACT
  Filled 2019-11-06: qty 3

## 2019-11-06 MED ORDER — ACETAMINOPHEN 160 MG/5ML PO SOLN
325.0000 mg | ORAL | Status: DC | PRN
  Filled 2019-11-06: qty 20.3

## 2019-11-06 MED ORDER — HEPARIN SODIUM (PORCINE) 1000 UNIT/ML IJ SOLN
INTRAMUSCULAR | Status: AC
  Filled 2019-11-06: qty 1

## 2019-11-06 MED ORDER — PHENYLEPHRINE HCL (PRESSORS) 10 MG/ML IV SOLN
INTRAVENOUS | Status: DC | PRN
  Administered 2019-11-06: 100 ug via INTRAVENOUS

## 2019-11-06 MED ORDER — ROCURONIUM BROMIDE 100 MG/10ML IV SOLN
INTRAVENOUS | Status: DC | PRN
  Administered 2019-11-06: 40 mg via INTRAVENOUS

## 2019-11-06 MED ORDER — ACETAMINOPHEN 325 MG PO TABS
325.0000 mg | ORAL_TABLET | ORAL | Status: DC | PRN
  Administered 2019-11-07: 650 mg via ORAL
  Filled 2019-11-06: qty 2

## 2019-11-06 MED ORDER — FENTANYL CITRATE (PF) 100 MCG/2ML IJ SOLN
INTRAMUSCULAR | Status: AC
  Administered 2019-11-06: 25 ug via INTRAVENOUS
  Filled 2019-11-06: qty 2

## 2019-11-06 MED ORDER — LIDOCAINE HCL (PF) 1 % IJ SOLN
INTRAMUSCULAR | Status: AC
  Filled 2019-11-06: qty 30

## 2019-11-06 MED ORDER — CHLORHEXIDINE GLUCONATE CLOTH 2 % EX PADS: 6.0000 | MEDICATED_PAD | Freq: Once | CUTANEOUS | Status: DC

## 2019-11-06 MED ORDER — ACETAMINOPHEN 325 MG RE SUPP
325.0000 mg | RECTAL | Status: DC | PRN
  Filled 2019-11-06: qty 2

## 2019-11-06 MED ORDER — SUGAMMADEX SODIUM 200 MG/2ML IV SOLN
INTRAVENOUS | Status: DC | PRN
  Administered 2019-11-06: 100 mg via INTRAVENOUS

## 2019-11-06 MED ORDER — FENTANYL CITRATE (PF) 100 MCG/2ML IJ SOLN
INTRAMUSCULAR | Status: DC | PRN
  Administered 2019-11-06 (×2): 50 ug via INTRAVENOUS

## 2019-11-06 MED ORDER — IPRATROPIUM-ALBUTEROL 0.5-2.5 (3) MG/3ML IN SOLN
RESPIRATORY_TRACT | Status: AC
  Filled 2019-11-06: qty 3

## 2019-11-06 MED ORDER — LACTATED RINGERS IV SOLN: INTRAVENOUS | Status: DC

## 2019-11-06 MED ORDER — ONDANSETRON HCL 4 MG/2ML IJ SOLN
4.0000 mg | Freq: Four times a day (QID) | INTRAMUSCULAR | Status: DC | PRN
  Administered 2019-11-07: 4 mg via INTRAVENOUS
  Filled 2019-11-06: qty 2

## 2019-11-06 MED ORDER — LABETALOL HCL 5 MG/ML IV SOLN
INTRAVENOUS | Status: DC | PRN
  Administered 2019-11-06: 2.5 mg via INTRAVENOUS

## 2019-11-06 MED ORDER — AMLODIPINE BESYLATE 5 MG PO TABS
5.0000 mg | ORAL_TABLET | Freq: Every day | ORAL | Status: DC
  Administered 2019-11-06 – 2019-11-07 (×2): 5 mg via ORAL
  Filled 2019-11-06 (×2): qty 1

## 2019-11-06 MED ORDER — IPRATROPIUM-ALBUTEROL 0.5-2.5 (3) MG/3ML IN SOLN
3.0000 mL | RESPIRATORY_TRACT | Status: DC
  Administered 2019-11-06: 3 mL via RESPIRATORY_TRACT

## 2019-11-06 MED ORDER — FENTANYL CITRATE (PF) 100 MCG/2ML IJ SOLN
25.0000 ug | INTRAMUSCULAR | Status: DC | PRN
  Administered 2019-11-06: 25 ug via INTRAVENOUS

## 2019-11-06 MED ORDER — HEMOSTATIC AGENTS (NO CHARGE) OPTIME
TOPICAL | Status: DC | PRN
  Administered 2019-11-06: 1 via TOPICAL

## 2019-11-06 MED ORDER — HEPARIN SODIUM (PORCINE) 1000 UNIT/ML IJ SOLN
INTRAMUSCULAR | Status: DC | PRN
  Administered 2019-11-06: 4000 [IU] via INTRAVENOUS

## 2019-11-06 MED ORDER — LIDOCAINE HCL (CARDIAC) PF 100 MG/5ML IV SOSY
PREFILLED_SYRINGE | INTRAVENOUS | Status: DC | PRN
  Administered 2019-11-06: 50 mg via INTRAVENOUS

## 2019-11-06 MED ORDER — PROPOFOL 10 MG/ML IV BOLUS
INTRAVENOUS | Status: DC | PRN
  Administered 2019-11-06: 70 mg via INTRAVENOUS
  Administered 2019-11-06: 20 mg via INTRAVENOUS

## 2019-11-06 MED ORDER — OXYCODONE-ACETAMINOPHEN 5-325 MG PO TABS
ORAL_TABLET | ORAL | Status: AC
  Administered 2019-11-06: 2 via ORAL
  Filled 2019-11-06: qty 2

## 2019-11-06 MED ORDER — FENTANYL CITRATE (PF) 100 MCG/2ML IJ SOLN
INTRAMUSCULAR | Status: AC
  Filled 2019-11-06: qty 2

## 2019-11-06 MED ORDER — FAMOTIDINE 20 MG PO TABS: 20.0000 mg | ORAL_TABLET | Freq: Once | ORAL | Status: AC

## 2019-11-06 MED ORDER — FIBRIN SEALANT 2 ML SINGLE DOSE KIT
PACK | CUTANEOUS | Status: DC | PRN
  Administered 2019-11-06: 2 mL via TOPICAL

## 2019-11-06 MED ORDER — LORAZEPAM 1 MG PO TABS
1.0000 mg | ORAL_TABLET | Freq: Four times a day (QID) | ORAL | Status: DC | PRN
  Administered 2019-11-06 – 2019-11-07 (×2): 1 mg via ORAL
  Filled 2019-11-06 (×4): qty 1

## 2019-11-06 MED ORDER — EPHEDRINE SULFATE 50 MG/ML IJ SOLN
INTRAMUSCULAR | Status: DC | PRN
  Administered 2019-11-06: 5 mg via INTRAVENOUS
  Administered 2019-11-06: 2.5 mg via INTRAVENOUS

## 2019-11-06 MED ORDER — ONDANSETRON HCL 4 MG/2ML IJ SOLN
INTRAMUSCULAR | Status: DC | PRN
  Administered 2019-11-06: 4 mg via INTRAVENOUS

## 2019-11-06 MED ORDER — CEFAZOLIN SODIUM-DEXTROSE 2-4 GM/100ML-% IV SOLN
2.0000 g | INTRAVENOUS | Status: AC
  Administered 2019-11-06: 14:00:00 1 g via INTRAVENOUS

## 2019-11-06 MED ORDER — CEFAZOLIN SODIUM-DEXTROSE 2-4 GM/100ML-% IV SOLN
INTRAVENOUS | Status: AC
  Administered 2019-11-07: 2 g via INTRAVENOUS
  Filled 2019-11-06: qty 100

## 2019-11-06 MED ORDER — SODIUM CHLORIDE 0.9 % IV SOLN: INTRAVENOUS | Status: DC | PRN

## 2019-11-06 MED ORDER — SODIUM CHLORIDE 0.9 % IV SOLN
INTRAVENOUS | Status: DC | PRN
  Administered 2019-11-06 (×2): 50 ug via INTRAVENOUS
  Administered 2019-11-06: 50 ug/min via INTRAVENOUS

## 2019-11-06 MED ORDER — OXYCODONE-ACETAMINOPHEN 5-325 MG PO TABS
1.0000 | ORAL_TABLET | ORAL | Status: DC | PRN
  Administered 2019-11-07: 2 via ORAL
  Filled 2019-11-06: qty 2

## 2019-11-06 MED ORDER — CEFAZOLIN SODIUM-DEXTROSE 2-4 GM/100ML-% IV SOLN
2.0000 g | Freq: Three times a day (TID) | INTRAVENOUS | Status: DC
  Filled 2019-11-06 (×2): qty 100

## 2019-11-06 MED ORDER — MORPHINE SULFATE (PF) 4 MG/ML IV SOLN: 2.0000 mg | INTRAVENOUS | Status: DC | PRN

## 2019-11-06 MED ORDER — ACETAMINOPHEN 325 MG PO TABS: 325.0000 mg | ORAL_TABLET | ORAL | Status: DC | PRN

## 2019-11-06 SURGICAL SUPPLY — 62 items
"PENCIL ELECTRO HAND CTR " (MISCELLANEOUS) IMPLANT
BAG DECANTER FOR FLEXI CONT (MISCELLANEOUS) ×2 IMPLANT
BLADE SURG 15 STRL LF DISP TIS (BLADE) ×1 IMPLANT
BLADE SURG 15 STRL SS (BLADE) ×1
BLADE SURG SZ11 CARB STEEL (BLADE) ×2 IMPLANT
BOOT SUTURE AID YELLOW STND (SUTURE) ×2 IMPLANT
BRUSH SCRUB EZ  4% CHG (MISCELLANEOUS) ×1
BRUSH SCRUB EZ 4% CHG (MISCELLANEOUS) ×1 IMPLANT
CANISTER SUCT 1200ML W/VALVE (MISCELLANEOUS) ×2 IMPLANT
CHLORAPREP W/TINT 26ML (MISCELLANEOUS) ×2 IMPLANT
COVER WAND RF STERILE (DRAPES) ×2 IMPLANT
DERMABOND ADVANCED (GAUZE/BANDAGES/DRESSINGS) ×1
DERMABOND ADVANCED .7 DNX12 (GAUZE/BANDAGES/DRESSINGS) ×1 IMPLANT
DRAPE 3/4 80X56 (DRAPES) ×2 IMPLANT
DRAPE INCISE IOBAN 66X45 STRL (DRAPES) ×2 IMPLANT
DRAPE LAPAROTOMY 77X122 PED (DRAPES) ×2 IMPLANT
ELECT CAUTERY BLADE 6.4 (BLADE) ×2 IMPLANT
ELECT REM PT RETURN 9FT ADLT (ELECTROSURGICAL) ×2
ELECTRODE REM PT RTRN 9FT ADLT (ELECTROSURGICAL) ×1 IMPLANT
GLOVE BIO SURGEON STRL SZ7 (GLOVE) ×6 IMPLANT
GLOVE INDICATOR 7.5 STRL GRN (GLOVE) ×2 IMPLANT
GOWN STRL REUS W/ TWL LRG LVL3 (GOWN DISPOSABLE) ×2 IMPLANT
GOWN STRL REUS W/ TWL XL LVL3 (GOWN DISPOSABLE) ×2 IMPLANT
GOWN STRL REUS W/TWL LRG LVL3 (GOWN DISPOSABLE) ×2
GOWN STRL REUS W/TWL XL LVL3 (GOWN DISPOSABLE) ×2
HEMOSTAT SURGICEL 2X3 (HEMOSTASIS) ×2 IMPLANT
HOLDER FOLEY CATH W/STRAP (MISCELLANEOUS) ×1 IMPLANT
IV NS 250ML (IV SOLUTION) ×1
IV NS 250ML BAXH (IV SOLUTION) ×1 IMPLANT
KIT TURNOVER KIT A (KITS) ×2 IMPLANT
LABEL OR SOLS (LABEL) ×2 IMPLANT
LOOP RED MAXI  1X406MM (MISCELLANEOUS) ×2
LOOP VESSEL MAXI 1X406 RED (MISCELLANEOUS) ×2 IMPLANT
LOOP VESSEL MINI 0.8X406 BLUE (MISCELLANEOUS) ×1 IMPLANT
LOOPS BLUE MINI 0.8X406MM (MISCELLANEOUS) ×2
NDL FILTER BLUNT 18X1 1/2 (NEEDLE) ×1 IMPLANT
NDL HYPO 25X1 1.5 SAFETY (NEEDLE) ×1 IMPLANT
NEEDLE FILTER BLUNT 18X 1/2SAF (NEEDLE) ×1
NEEDLE FILTER BLUNT 18X1 1/2 (NEEDLE) ×1 IMPLANT
NEEDLE HYPO 25X1 1.5 SAFETY (NEEDLE) ×2 IMPLANT
NS IRRIG 500ML POUR BTL (IV SOLUTION) ×2 IMPLANT
PACK BASIN MAJOR (MISCELLANEOUS) ×2 IMPLANT
PATCH CAROTID ECM VASC 1X10 (Prosthesis & Implant Heart) ×2 IMPLANT
PENCIL ELECTRO HAND CTR (MISCELLANEOUS) ×2 IMPLANT
SHUNT W TPORT 9FR PRUITT F3 (SHUNT) ×2 IMPLANT
SUT MNCRL 4-0 (SUTURE) ×1
SUT MNCRL 4-0 27XMFL (SUTURE) ×1
SUT PROLENE 6 0 BV (SUTURE) ×10 IMPLANT
SUT PROLENE 7 0 BV 1 (SUTURE) ×4 IMPLANT
SUT SILK 2 0 (SUTURE) ×1
SUT SILK 2-0 18XBRD TIE 12 (SUTURE) ×1 IMPLANT
SUT SILK 3 0 (SUTURE) ×1
SUT SILK 3-0 18XBRD TIE 12 (SUTURE) ×1 IMPLANT
SUT SILK 4 0 (SUTURE) ×1
SUT SILK 4-0 18XBRD TIE 12 (SUTURE) ×1 IMPLANT
SUT VIC AB 3-0 SH 27 (SUTURE) ×2
SUT VIC AB 3-0 SH 27X BRD (SUTURE) ×2 IMPLANT
SUTURE MNCRL 4-0 27XMF (SUTURE) ×1 IMPLANT
SYR 10ML LL (SYRINGE) ×4 IMPLANT
SYR 20ML LL LF (SYRINGE) ×2 IMPLANT
TRAY FOLEY MTR SLVR 16FR STAT (SET/KITS/TRAYS/PACK) ×2 IMPLANT
TUBING CONNECTING 10 (TUBING) ×1 IMPLANT

## 2019-11-06 NOTE — Progress Notes (Signed)
States breathing better

## 2019-11-06 NOTE — Anesthesia Procedure Notes (Signed)
Procedure Name: Intubation Date/Time: 11/06/2019 2:37 PM Performed by: Louann Sjogren, CRNA Pre-anesthesia Checklist: Patient identified, Patient being monitored, Timeout performed, Emergency Drugs available and Suction available Patient Re-evaluated:Patient Re-evaluated prior to induction Oxygen Delivery Method: Circle system utilized Preoxygenation: Pre-oxygenation with 100% oxygen Induction Type: IV induction Ventilation: Mask ventilation without difficulty Laryngoscope Size: Mac and 3 Grade View: Grade I Tube type: Oral Tube size: 7.0 mm Number of attempts: 1 Airway Equipment and Method: Stylet Placement Confirmation: ETT inserted through vocal cords under direct vision,  positive ETCO2 and breath sounds checked- equal and bilateral Secured at: 21 cm Tube secured with: Tape Dental Injury: Teeth and Oropharynx as per pre-operative assessment

## 2019-11-06 NOTE — Anesthesia Postprocedure Evaluation (Signed)
Anesthesia Post Note  Patient: Christina Hester  Procedure(s) Performed: ENDARTERECTOMY CAROTID (Right Neck)  Patient location during evaluation: PACU Anesthesia Type: General Level of consciousness: awake and alert Pain management: pain level controlled Vital Signs Assessment: post-procedure vital signs reviewed and stable Respiratory status: spontaneous breathing, nonlabored ventilation, respiratory function stable and patient connected to nasal cannula oxygen Cardiovascular status: blood pressure returned to baseline and stable Postop Assessment: no apparent nausea or vomiting Anesthetic complications: no     Last Vitals:  Vitals:   11/06/19 1653 11/06/19 1700  BP: 120/74 108/67  Pulse: 63 (!) 58  Resp: (!) 22 12  Temp: 37.1 C   SpO2: 95% 97%    Last Pain:  Vitals:   11/06/19 1645  TempSrc:   PainSc: [place]

## 2019-11-06 NOTE — Anesthesia Procedure Notes (Signed)
Arterial Line Insertion Start/End4/22/2021 2:07 PM Performed by: CRNA  Patient location: OR. Preanesthetic checklist: patient identified, IV checked, site marked, risks and benefits discussed, surgical consent, monitors and equipment checked, pre-op evaluation, timeout performed and anesthesia consent Left, radial was placed Catheter size: 20 G Hand hygiene performed  and maximum sterile barriers used   Attempts: 1 Procedure performed without using ultrasound guided technique.

## 2019-11-06 NOTE — H&P (Signed)
[facility] History & Physical Update  The patient was interviewed and re-examined.  The patient's previous History and Physical has been reviewed and is unchanged.  There is no change in the plan of care. We plan to proceed with the scheduled procedure.  Leotis Pain, MD  11/06/2019, 12:59 PM

## 2019-11-06 NOTE — Op Note (Signed)
[facility]   OPERATIVE NOTE  PROCEDURE:   1.  Right carotid endarterectomy with CorMatrix arterial patch reconstruction  PRE-OPERATIVE DIAGNOSIS: 1.  High grade right carotid stenosis   POST-OPERATIVE DIAGNOSIS: same as above   SURGEON: Leotis Pain, MD  ASSISTANT(S): Hezzie Bump, PA-C  ANESTHESIA: general  ESTIMATED BLOOD LOSS: 50 cc  FINDING(S): 1.  right carotid plaque.  SPECIMEN(S):  Carotid plaque (sent to Pathology)  INDICATIONS:   Christina Hester is a 79 y.o. female who presents with right carotid stenosis of >70%.  I discussed with the patient the risks, benefits, and alternatives to carotid endarterectomy.  I discussed the differences between carotid stenting and carotid endarterectomy. I discussed the procedural details of carotid endarterectomy with the patient.  The patient is aware that the risks of carotid endarterectomy include but are not limited to: bleeding, infection, stroke, myocardial infarction, death, cranial nerve injuries both temporary and permanent, neck hematoma, possible airway compromise, labile blood pressure post-operatively, cerebral hyperperfusion syndrome, and possible need for additional interventions in the future. The patient is aware of the risks and agrees to proceed forward with the procedure.  An assistant was present during the procedure to help facilitate the exposure and expedite the procedure.  DESCRIPTION: After full informed written consent was obtained from the patient, the patient was brought back to the operating room and placed supine upon the operating table.  Prior to induction, the patient received IV antibiotics.  After obtaining adequate anesthesia, the patient was placed into a modified beach chair position with a shoulder roll in place and the patient's neck slightly hyperextended and rotated away from the surgical site.  The patient was prepped in the standard fashion for a carotid endarterectomy. The  assistant provided retraction and mobilization to help facilitate exposure and expedite the procedure throughout the entire procedure.  This included following suture, using retractors, and optimizing lighting. I made an incision anterior to the sternocleidomastoid muscle and dissected down through the subcutaneous tissue.  The platysmas was opened with electrocautery.  Then I dissected down to the internal jugular vein and facial vein.  The facial vein is ligated and divided between 2-0 silk ties.  This was dissected posteriorly until I obtained visualization of the common carotid artery.  This was dissected out and then a vessel loop was placed around the common carotid artery.  I then dissected in a periadventitial fashion along the common carotid artery up to the bifurcation.  I then identified the external carotid artery and the superior thyroid artery.  I placed a vessel loop around the superior thyroid artery, and I also dissected out the external carotid artery and placed a vessel loop around it. In the process of this dissection, the hypoglossal nerve was identified and protected from harm.  I then dissected out the internal carotid artery until I identified an area in the internal carotid artery clearly above the stenosis.  I dissected slightly distal to this area, and placed a vessel loop around the artery.  At this point, we gave the patient 4000 units of intravenous heparin.  After this was allowed to circulate for several minutes, I pulled up control on the vessel loops to clamp the internal carotid artery, external carotid artery, superior thyroid artery, and then the common carotid artery.  I then made an arteriotomy in the common carotid artery with a 11 blade, and extended the arteriotomy with a Potts scissor down into the common carotid artery, then I carried the arteriotomy  through the bifurcation into the internal carotid artery until I reached an area that was not diseased.  At this point, I  took the Pruitt-[place] shunt that previously been prepared and I inserted it into the internal carotid artery first, and then into the common carotid artery taking care to flush and de-air prior to release of control. At this point, I started the endarterectomy in the common carotid artery with a [place]field elevator and carried this dissection down into the common carotid artery circumferentially.  Then I transected the plaque at a segment where it was adherent and transected the plaque with Potts scissors.  I then carried this dissection up into the external carotid artery.  The plaque was extracted by unclamping the external carotid artery and performing an eversion endarterectomy.  The dissection was then carried into the internal carotid artery where a nice feathered end point was created with gentle traction.  I passed the plaque off the field as a specimen. At this point I removed all loose flecks and remaining disease possible.  At this point, I was satisfied that the minimal remaining disease was densely adherent to the wall and wall integrity was intact. The distal endpoint was tacked down with two 7-0 Prolene sutures.  I then fashioned a CorMatrix arterial patch for the artery and sewed it in place with two running stitch of 6-0 Prolene.  I started at the distal endpoint and ran one half the length of the arteriotomy.  I then cut and beveled the patch to an appropriate length to match the arteriotomy.  I started the second 6-0 Prolene at the proximal end point.  The medial suture line was completed and the lateral suture line was run approximately one quarter the length of the arteriotomy.  Prior to completing this patch angioplasty, I removed the shunt first from the internal carotid artery, from which there was excellent backbleeding, and clamped it.  Then I removed the shunt from the common carotid artery, from which there was excellent antegrade bleeding, and then clamped it.  At this point, I allowed  the external carotid artery to backbleed, which was excellent.  Then I instilled heparinized saline in this patched artery and then completed the patch angioplasty in the usual fashion.  First, I released the clamp on the external carotid artery, then I released it on the common carotid artery.  After waiting a few seconds, I then released it on the internal carotid artery. Several minutes of pressure were held and 6-0 Prolene patch sutures were used as need for hemostasis.  At this point, I placed Surgicel and Evicel topical hemostatic agents.  There was no more active bleeding in the surgical site.  The sternocleidomastoid space was closed with three interrupted 3-0 Vicryl sutures. I then reapproximated the platysma muscle with a running stitch of 3-0 Vicryl.  The skin was then closed with a running subcuticular 4-0 Monocryl.  The skin was then cleaned, dried and Dermabond was used to reinforce the skin closure.  The patient awakened and was taken to the recovery room in stable condition, following commands and moving all four extremities without any apparent deficits.    COMPLICATIONS: none  CONDITION: stable  Leotis Pain  11/06/2019, 3:39 PM    This note was created with Dragon Medical transcription system. Any errors in dictation are purely unintentional.

## 2019-11-06 NOTE — Progress Notes (Signed)
Dr dew in to see pt  Sticks tongue out straight

## 2019-11-06 NOTE — Progress Notes (Signed)
Dup neb given for complaint of difficult breathing   Lung sounds diminished

## 2019-11-06 NOTE — Transfer of Care (Signed)
Immediate Anesthesia Transfer of Care Note  Patient: Christina Hester  Procedure(s) Performed: ENDARTERECTOMY CAROTID (Right Neck)  Patient Location: PACU  Anesthesia Type:General  Level of Consciousness: sedated and patient cooperative  Airway & Oxygen Therapy: Patient Spontanous Breathing  Post-op Assessment: Report given to RN and Post -op Vital signs reviewed and stable  Post vital signs: Reviewed and stable  Last Vitals:  Vitals Value Taken Time  BP 146/70 11/06/19 1600  Temp    Pulse 86 11/06/19 1602  Resp 26 11/06/19 1602  SpO2 98 % 11/06/19 1602  Vitals shown include unvalidated device data.  Last Pain:  Vitals:   11/06/19 1246  TempSrc: Temporal  PainSc: 0-No pain         Complications: No apparent anesthesia complications and Patient re-intubated

## 2019-11-06 NOTE — Anesthesia Preprocedure Evaluation (Addendum)
Anesthesia Evaluation  Patient identified by MRN, date of birth, ID band Patient awake    Reviewed: Allergy & Precautions, H&P , NPO status , reviewed documented beta blocker date and time   Airway Mallampati: II  TM Distance: >3 FB Neck ROM: limited    Dental  (+) Upper Dentures, Partial Lower, Chipped, Missing, Caps   Pulmonary pneumonia, COPD, former smoker,     + decreased breath sounds      Cardiovascular hypertension, + angina + CAD and + Peripheral Vascular Disease  Normal cardiovascular exam  2018 ECHO Study Conclusions   - Left ventricle: The cavity size was normal. There was mild  concentric hypertrophy. Systolic function was normal. The  estimated ejection fraction was in the range of 55% to 60%.  Possible mild hypokinesis of the inferior myocardium. Doppler  parameters are consistent with abnormal left ventricular  relaxation (grade 1 diastolic dysfunction).  - Mitral valve: There was mild regurgitation  Cardiac clearance: She is deemed appropriate cardiovascular risk for the planned procedure and may proceed without additional cardiovascular testing.   Neuro/Psych PSYCHIATRIC DISORDERS Anxiety    GI/Hepatic neg GERD  ,  Endo/Other    Renal/GU      Musculoskeletal   Abdominal   Peds  Hematology   Anesthesia Other Findings Past Medical History: No date: AAA (abdominal aortic aneurysm) ([place])     Comment:  a. 05/2016 Abd U/S: 3.02 x 3.02 AAA. No date: Anxiety No date: CAD (coronary artery disease)     Comment:  a. 02/2017 NSTEMI/Cath: LM nl, LAD mild dzs, D2 min               irregs, LCX mild dzs, OM2 50ost, RCA 95p, 147m, L-->R               collats, EF 55-65%. No date: COPD (chronic obstructive pulmonary disease) (HCC) No date: Diastolic dysfunction     Comment:  a. 02/2017 Echo: EF 55-60%, gr1 DD, ? inf HK, mild MR. No date: Hx of basal cell carcinoma     Comment:  back  12/02/2018: Hx  of squamous cell carcinoma     Comment:  Right lateral breast  No date: Hyperlipidemia No date: Hypertension No date: PAD (peripheral artery disease) (HCC)     Comment:  a. 05/2016 ABI's: R 1.22, L 1.11. No date: Tobacco abuse Past Surgical History: No date: ABDOMINAL HYSTERECTOMY No date: APPENDECTOMY 80's or 90's: BREAST EXCISIONAL BIOPSY; Left     Comment:  NEG No date: CARDIAC CATHETERIZATION 05/22/2018: COLONOSCOPY; N/A     Comment:  Procedure: COLONOSCOPY;  Surgeon: Lin Landsman,               MD;  Location: [facility];  Service:               Gastroenterology;  Laterality: N/A; 03/05/2017: LEFT HEART CATH; Bilateral     Comment:  Procedure: Left Heart Cath with poss PCI;  Surgeon:               Wellington Hampshire, MD;  Location: [facility];                Service: Cardiovascular;  Laterality: Bilateral;   Reproductive/Obstetrics                          Anesthesia Physical Anesthesia Plan  ASA: IV  Anesthesia Plan: General   Post-op Pain Management:    Induction: Intravenous  PONV Risk Score and Plan: Ondansetron and Treatment may vary due to age or medical condition  Airway Management Planned: Oral ETT  Additional Equipment:   Intra-op Plan:   Post-operative Plan: Extubation in OR  Informed Consent: I have reviewed the patients History and Physical, chart, labs and discussed the procedure including the risks, benefits and alternatives for the proposed anesthesia with the patient or authorized representative who has indicated his/her understanding and acceptance.     Dental Advisory Given  Plan Discussed with: CRNA  Anesthesia Plan Comments:        Anesthesia Quick Evaluation

## 2019-11-07 DIAGNOSIS — I6521 Occlusion and stenosis of right carotid artery: Principal | ICD-10-CM

## 2019-11-07 LAB — CBC
HCT: 29.1 % — ABNORMAL LOW (ref 36.0–46.0)
Hemoglobin: 10.2 g/dL — ABNORMAL LOW (ref 12.0–15.0)
MCH: 31 pg (ref 26.0–34.0)
MCHC: 35.1 g/dL (ref 30.0–36.0)
MCV: 88.4 fL (ref 80.0–100.0)
Platelets: 160 10*3/uL (ref 150–400)
RBC: 3.29 MIL/uL — ABNORMAL LOW (ref 3.87–5.11)
RDW: 12.4 % (ref 11.5–15.5)
WBC: 5.5 10*3/uL (ref 4.0–10.5)
nRBC: 0 % (ref 0.0–0.2)

## 2019-11-07 LAB — BASIC METABOLIC PANEL
Anion gap: 7 (ref 5–15)
BUN: 14 mg/dL (ref 8–23)
CO2: 26 mmol/L (ref 22–32)
Calcium: 8.6 mg/dL — ABNORMAL LOW (ref 8.9–10.3)
Chloride: 97 mmol/L — ABNORMAL LOW (ref 98–111)
Creatinine, Ser: 0.69 mg/dL (ref 0.44–1.00)
GFR calc Af Amer: >60 mL/min (ref >60)
GFR calc non Af Amer: >60 mL/min (ref >60)
Glucose, Bld: 106 mg/dL — ABNORMAL HIGH (ref 70–99)
Potassium: 4.3 mmol/L (ref 3.5–5.1)
Sodium: 130 mmol/L — ABNORMAL LOW (ref 135–145)

## 2019-11-07 LAB — MRSA PCR SCREENING: MRSA by PCR: NEGATIVE

## 2019-11-07 MED ORDER — ALUM & MAG HYDROXIDE-SIMETH 200-200-20 MG/5ML PO SUSP: 15.0000 mL | ORAL | Status: DC | PRN

## 2019-11-07 MED ORDER — SODIUM CHLORIDE 1 G PO TABS
2.0000 g | ORAL_TABLET | Freq: Once | ORAL | Status: AC
  Administered 2019-11-07: 2 g via ORAL
  Filled 2019-11-07: qty 2

## 2019-11-07 MED ORDER — FAMOTIDINE IN NACL 20-0.9 MG/50ML-% IV SOLN
20.0000 mg | Freq: Every day | INTRAVENOUS | Status: DC
  Administered 2019-11-07: 20 mg via INTRAVENOUS
  Filled 2019-11-07: qty 50

## 2019-11-07 MED ORDER — CLOPIDOGREL BISULFATE 75 MG PO TABS
75.0000 mg | ORAL_TABLET | Freq: Every day | ORAL | Status: DC
  Administered 2019-11-07: 75 mg via ORAL
  Filled 2019-11-07: qty 1

## 2019-11-07 MED ORDER — MAGNESIUM SULFATE 2 GM/50ML IV SOLN: 2.0000 g | Freq: Every day | INTRAVENOUS | Status: DC | PRN

## 2019-11-07 MED ORDER — METOPROLOL TARTRATE 5 MG/5ML IV SOLN: 2.0000 mg | INTRAVENOUS | Status: DC | PRN

## 2019-11-07 MED ORDER — HYDRALAZINE HCL 20 MG/ML IJ SOLN: 5.0000 mg | INTRAMUSCULAR | Status: DC | PRN

## 2019-11-07 MED ORDER — ATORVASTATIN CALCIUM 10 MG PO TABS: 10.0000 mg | ORAL_TABLET | Freq: Every day | ORAL | Status: DC

## 2019-11-07 MED ORDER — PHENOL 1.4 % MT LIQD
1.0000 | OROMUCOSAL | Status: DC | PRN
  Filled 2019-11-07: qty 177

## 2019-11-07 MED ORDER — TRAMADOL HCL 50 MG PO TABS: 50.0000 mg | ORAL_TABLET | Freq: Four times a day (QID) | ORAL | 0 refills | Status: DC | PRN

## 2019-11-07 MED ORDER — BUPROPION HCL ER (SR) 100 MG PO TB12
100.0000 mg | ORAL_TABLET | Freq: Every day | ORAL | Status: DC
  Administered 2019-11-07: 100 mg via ORAL
  Filled 2019-11-07: qty 1

## 2019-11-07 MED ORDER — SODIUM CHLORIDE 0.9 % IV SOLN
INTRAVENOUS | Status: DC
  Administered 2019-11-07: 75 mL/h via INTRAVENOUS

## 2019-11-07 MED ORDER — POTASSIUM CHLORIDE CRYS ER 20 MEQ PO TBCR: 20.0000 meq | EXTENDED_RELEASE_TABLET | Freq: Every day | ORAL | Status: DC | PRN

## 2019-11-07 MED ORDER — ESMOLOL HCL-SODIUM CHLORIDE 2000 MG/100ML IV SOLN: 25.0000 ug/kg/min | INTRAVENOUS | Status: DC

## 2019-11-07 MED ORDER — ATORVASTATIN CALCIUM 10 MG PO TABS: 10.0000 mg | ORAL_TABLET | Freq: Every day | ORAL | 3 refills | Status: DC

## 2019-11-07 MED ORDER — LABETALOL HCL 5 MG/ML IV SOLN: 10.0000 mg | INTRAVENOUS | Status: DC | PRN

## 2019-11-07 MED ORDER — SUMATRIPTAN SUCCINATE 50 MG PO TABS
25.0000 mg | ORAL_TABLET | ORAL | Status: DC | PRN
  Filled 2019-11-07: qty 1

## 2019-11-07 MED ORDER — CLOPIDOGREL BISULFATE 75 MG PO TABS: 75.0000 mg | ORAL_TABLET | Freq: Every day | ORAL | Status: DC

## 2019-11-07 MED ORDER — NITROGLYCERIN IN D5W 200-5 MCG/ML-% IV SOLN: 5.0000 ug/min | INTRAVENOUS | Status: DC

## 2019-11-07 MED ORDER — IPRATROPIUM-ALBUTEROL 0.5-2.5 (3) MG/3ML IN SOLN
3.0000 mL | RESPIRATORY_TRACT | Status: DC
  Administered 2019-11-07 (×2): 3 mL via RESPIRATORY_TRACT
  Filled 2019-11-07 (×3): qty 3

## 2019-11-07 MED ORDER — ALBUTEROL SULFATE (2.5 MG/3ML) 0.083% IN NEBU: 2.5000 mg | INHALATION_SOLUTION | RESPIRATORY_TRACT | Status: DC | PRN

## 2019-11-07 MED ORDER — SODIUM CHLORIDE 0.9 % IV SOLN: 500.0000 mL | Freq: Once | INTRAVENOUS | Status: DC | PRN

## 2019-11-07 MED ORDER — CHLORHEXIDINE GLUCONATE CLOTH 2 % EX PADS: 6.0000 | MEDICATED_PAD | Freq: Every day | CUTANEOUS | Status: DC

## 2019-11-07 MED ORDER — OXYCODONE-ACETAMINOPHEN 5-325 MG PO TABS: 1.0000 | ORAL_TABLET | Freq: Four times a day (QID) | ORAL | Status: DC | PRN

## 2019-11-07 MED ORDER — GUAIFENESIN-DM 100-10 MG/5ML PO SYRP: 15.0000 mL | ORAL_SOLUTION | ORAL | Status: DC | PRN

## 2019-11-07 MED ORDER — DOCUSATE SODIUM 100 MG PO CAPS
100.0000 mg | ORAL_CAPSULE | Freq: Every day | ORAL | Status: DC
  Administered 2019-11-07: 100 mg via ORAL
  Filled 2019-11-07: qty 1

## 2019-11-07 NOTE — Progress Notes (Signed)
To RA post neb treatment

## 2019-11-07 NOTE — Discharge Instructions (Signed)
Vascular Surgery Discharge Instructions 1) you may shower as of Saturday.  Gently wash incision with soap and water and pat dry.  Dermabond to fall off on its own 2) no driving until follow-up in our office 3) do not engage in any strenuous activity or lifting heavier than 10 pounds until you follow-up in our office

## 2019-11-07 NOTE — Discharge Summary (Signed)
[facility]    Discharge Summary  Patient ID:  Christina Hester MRN: [id number] DOB/AGE: 1941/01/11 79 y.o.  Admit date: 11/06/2019 Discharge date: 11/07/2019 Date of Surgery: 11/06/2019 Surgeon: Surgeon(s): Algernon Huxley, MD  Admission Diagnosis: Carotid stenosis, right [I65.21]  Discharge Diagnoses:  Carotid stenosis, right [I65.21]  Secondary Diagnoses: Past Medical History:  Diagnosis Date  . AAA (abdominal aortic aneurysm) ([place])    a. 05/2016 Abd U/S: 3.02 x 3.02 AAA.  Marland Kitchen Anxiety   . CAD (coronary artery disease)    a. 02/2017 NSTEMI/Cath: LM nl, LAD mild dzs, D2 min irregs, LCX mild dzs, OM2 50ost, RCA 95p, 156m, L-->R collats, EF 55-65%.  Marland Kitchen COPD (chronic obstructive pulmonary disease) ([place])   . Diastolic dysfunction    a. 02/2017 Echo: EF 55-60%, gr1 DD, ? inf HK, mild MR.  Marland Kitchen Hx of basal cell carcinoma    back   . 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 Hyperlipidemia   . Hypertension   . PAD (peripheral artery disease) ([place])    a. 05/2016 ABI's: R 1.22, L 1.11.  . Tobacco abuse    Procedure(s): 1.  Right carotid endarterectomy with CorMatrix arterial patch reconstruction  Discharged Condition: Good  HPI / Hospital Course:  Christina Hester is a 79 y.o. female who presents with right carotid stenosis of >70%.  I discussed with the patient the risks, benefits, and alternatives to carotid endarterectomy.  I discussed the differences between carotid stenting and carotid endarterectomy. I discussed the procedural details of carotid endarterectomy with the patient.  The patient is aware that the risks of carotid endarterectomy include but are not limited to: bleeding, infection, stroke, myocardial infarction, death, cranial nerve injuries both temporary and permanent, neck hematoma, possible airway compromise, labile blood pressure post-operatively, cerebral hyperperfusion syndrome, and possible need for additional  interventions in the future. The patient is aware of the risks and agrees to proceed forward with the procedure.  On 11/06/19, the patient underwent: 1.  Right carotid endarterectomy with CorMatrix arterial patch reconstruction  The patient tolerated the procedure well was transferred from her room to the ICU for observation overnight.  Patient's night of surgery was unremarkable.  During the patient's brief stay, her diet was advanced, her Foley was removed and she was retaining independently, her pain was controlled to the use of p.o. pain medication and she was ambulating at baseline.  Upon discharge, the patient was afebrile with stable vital signs and unremarkable physical exam.  Procedure(s): ENDARTERECTOMY CAROTID (Right)  Physical exam:  Alert and oriented x3, no acute distress Face: Symmetrical Neck:  Trachea - midline  Incision: Minimal edema and ecchymosis.  Clean dry intact.  Dermabond intact. Cardiovascular: Regular rate and rhythm Pulmonary: Clear to auscultation bilaterally Abdomen: soft, nondistended, nontender positive bowel sounds GU: Foley intact draining clear urine Extremities: Warm distally to fingers and toes  Neuro: Intact.  No deficits noted.  Labs: As below  Complications: None  Consults: None  Significant Diagnostic Studies: CBC Lab Results  Component Value Date   WBC 5.5 11/07/2019   HGB 10.2 (L) 11/07/2019   HCT 29.1 (L) 11/07/2019   MCV 88.4 11/07/2019   PLT 160 11/07/2019   BMET    Component Value Date/Time   NA 130 (L) 11/07/2019 0610   NA 135 01/07/2018 0858   NA 139 04/28/2014 1649   K 4.3 11/07/2019 0610   K 3.5 04/28/2014 1649   CL  97 (L) 11/07/2019 0610   CL 100 04/28/2014 1649   CO2 26 11/07/2019 0610   CO2 30 04/28/2014 1649   GLUCOSE 106 (H) 11/07/2019 0610   GLUCOSE 94 04/28/2014 1649   BUN 14 11/07/2019 0610   BUN 12 01/07/2018 0858   BUN 12 04/28/2014 1649   CREATININE 0.69 11/07/2019 0610   CREATININE 0.64 04/28/2014  1649   CALCIUM 8.6 (L) 11/07/2019 0610   CALCIUM 9.2 04/28/2014 1649   GFRNONAA >60 11/07/2019 0610   GFRNONAA >60 04/28/2014 1649   GFRAA >60 11/07/2019 0610   GFRAA >60 04/28/2014 1649   COAG Lab Results  Component Value Date   INR 1.0 11/04/2019   INR 1.03 05/21/2018   INR 1.13 03/03/2017   Disposition:  Discharge to :Home  Allergies as of 11/07/2019      Reactions   Aspirin Tinitus   Prednisone Other (See Comments)   Makes violent   Sulfa Antibiotics Nausea And Vomiting   Symbicort [budesonide-formoterol Fumarate] Other (See Comments)   Patient felt "out of her mind" and angry.      Medication List    TAKE these medications   amLODipine 5 MG tablet Commonly known as: NORVASC Take one tab po qhs   atorvastatin 10 MG tablet Commonly known as: LIPITOR Take 1 tablet (10 mg total) by mouth daily.   buPROPion 100 MG 12 hr tablet Commonly known as: WELLBUTRIN SR Take 1 tablet (100 mg total) by mouth daily.   clopidogrel 75 MG tablet Commonly known as: PLAVIX Take 1 tablet (75 mg total) by mouth daily.   clotrimazole 10 MG troche Commonly known as: MYCELEX DISSOLVE 1 TABLET BY MOUTH 5 TIMES DAILY   ipratropium-albuterol 0.5-2.5 (3) MG/3ML Soln Commonly known as: DUONEB Take 3 mLs by nebulization every 4 (four) hours.   LORazepam 0.5 MG tablet Commonly known as: ATIVAN TAKE 1/2 TO 1 TABLET BY MOUTH AT NIGHT What changed:   when to take this  reasons to take this   nitroGLYCERIN 0.4 MG SL tablet Commonly known as: NITROSTAT Place 1 tablet (0.4 mg total) under the tongue every 5 (five) minutes as needed for chest pain.   ondansetron 4 MG disintegrating tablet Commonly known as: Zofran ODT Take 1 tablet (4 mg total) by mouth every 8 (eight) hours as needed for nausea or vomiting.   Tiotropium Bromide Monohydrate 2.5 MCG/ACT Aers Commonly known as: Spiriva Respimat Inhale 1 puff into the lungs daily.   traMADol 50 MG tablet Commonly known as:  Ultram Take 1 tablet (50 mg total) by mouth every 6 (six) hours as needed for moderate pain or severe pain.   Ventolin HFA 108 (90 Base) MCG/ACT inhaler Generic drug: albuterol INHALE 2 PUFFS INTO THE LUNGS EVERY 6 HOURS AS NEEDED FOR WHEEZING ORSHORTNESS OF BREATH      Verbal and written Discharge instructions given to the patient. Wound care per Discharge AVS Follow-up Information    Kris Hartmann, NP Follow up in 1 month(s).   Specialty: Vascular Surgery Why: First post-op follow up. Will need carotid duplex with visit.  Contact information: [place] [address] [phone]          Signed: Sela Hua, PA-C  11/07/2019, 11:52 AM

## 2019-11-10 ENCOUNTER — Ambulatory Visit (INDEPENDENT_AMBULATORY_CARE_PROVIDER_SITE_OTHER): Payer: Medicare Other | Admitting: Nurse Practitioner

## 2019-11-10 ENCOUNTER — Encounter: Payer: Self-pay | Admitting: Nurse Practitioner

## 2019-11-10 ENCOUNTER — Other Ambulatory Visit: Payer: Self-pay

## 2019-11-10 ENCOUNTER — Telehealth (INDEPENDENT_AMBULATORY_CARE_PROVIDER_SITE_OTHER): Payer: Self-pay

## 2019-11-10 VITALS — BP 159/73 | HR 80 | Temp 99.1°F | Ht 60.0 in | Wt 82.0 lb

## 2019-11-10 DIAGNOSIS — R05 Cough: Secondary | ICD-10-CM | POA: Diagnosis not present

## 2019-11-10 DIAGNOSIS — Z9889 Other specified postprocedural states: Secondary | ICD-10-CM | POA: Diagnosis not present

## 2019-11-10 DIAGNOSIS — Z09 Encounter for follow-up examination after completed treatment for conditions other than malignant neoplasm: Secondary | ICD-10-CM

## 2019-11-10 DIAGNOSIS — I10 Essential (primary) hypertension: Secondary | ICD-10-CM

## 2019-11-10 DIAGNOSIS — R059 Cough, unspecified: Secondary | ICD-10-CM | POA: Insufficient documentation

## 2019-11-10 LAB — SURGICAL PATHOLOGY

## 2019-11-10 MED ORDER — BENZONATATE 200 MG PO CAPS: 200.0000 mg | ORAL_CAPSULE | Freq: Two times a day (BID) | ORAL | 1 refills | Status: DC | PRN

## 2019-11-10 NOTE — Patient Outreach (Signed)
[facility]) Care Management  [id number]  Christina Hester [id number]   Red Emmi: Reason for red emmi alert:  Unfilled Rx- yes                                               Able to fill today or tomorrow--no  Placed call to patient and reviewed reason for call. Patient reports she has all her prescriptions except her Lipitor.  Reports she can not take Lipitor. Reports she has all her other medications.  Denies any additional needs or concerns today.  PLAN: Case closure as no needs indentified.  Tomasa Rand, RN, BSN, CEN [facility] [employer] [phone]

## 2019-11-10 NOTE — Progress Notes (Addendum)
[facility] [place], [place] [address]  Internal MEDICINE  Telephone Visit  Patient Name: Christina Hester  [id number]  [id number]  Date of Service: 11/10/2019   Transitional care management   I connected with the patient at 3:05pm by telephone and verified the patients identity using two identifiers.   I discussed the limitations, risks, security and privacy concerns of performing an evaluation and management service by telephone and the availability of in person appointments. I also discussed with the patient that there may be a patient responsible charge related to the service.  The patient expressed understanding and agrees to proceed.    Chief Complaint  Patient presents with  . Hospitalization Follow-up    artery stenosis     The patient has been contacted via telephone for follow up visit due to concerns for spread of novel coronavirus. She presents as hospital follow up. She had right carotid artery endarterectomy done on 11/06/2019. She was admitted after that for overnight stay. She was released on 11/07/2019. She states that she has a scratchy throat since surgery. Makes her cough like crazy. . Benzonatate cough pearls have helped, but she is about to run out of them. She follows up with the vascular surgeon next month. She is not allowed to drive until she has follow up. She will restart plavix tonight. She has not taken her blood pressure medication since her hospitalization. Blood pressure has been better since surgery. No changes were made to her medications. Atorvastatin was recommended to her, however, she has tried this multiple times in the past, as well as other statin medications, and she has been unable to tolerate them.       Current Medication: Outpatient Encounter Medications as of 11/10/2019  Medication Sig  . amLODipine (NORVASC) 5 MG tablet Take one tab po qhs  . atorvastatin (LIPITOR) 10 MG tablet Take 1 tablet (10 mg total) by mouth  daily.  Marland Kitchen buPROPion (WELLBUTRIN SR) 100 MG 12 hr tablet Take 1 tablet (100 mg total) by mouth daily.  . clopidogrel (PLAVIX) 75 MG tablet Take 1 tablet (75 mg total) by mouth daily.  Marland Kitchen ipratropium-albuterol (DUONEB) 0.5-2.5 (3) MG/3ML SOLN Take 3 mLs by nebulization every 4 (four) hours.  Marland Kitchen LORazepam (ATIVAN) 0.5 MG tablet TAKE 1/2 TO 1 TABLET BY MOUTH AT NIGHT (Patient taking differently: at bedtime as needed. TAKE 1/2 TO 1 TABLET BY MOUTH AT NIGHT)  . VENTOLIN HFA 108 (90 Base) MCG/ACT inhaler INHALE 2 PUFFS INTO THE LUNGS EVERY 6 HOURS AS NEEDED FOR WHEEZING ORSHORTNESS OF BREATH  . benzonatate (TESSALON) 200 MG capsule Take 1 capsule (200 mg total) by mouth 2 (two) times daily as needed for cough.  . clotrimazole (MYCELEX) 10 MG troche DISSOLVE 1 TABLET BY MOUTH 5 TIMES DAILY (Patient not taking: Reported on 11/06/2019)  . nitroGLYCERIN (NITROSTAT) 0.4 MG SL tablet Place 1 tablet (0.4 mg total) under the tongue every 5 (five) minutes as needed for chest pain. (Patient not taking: Reported on 11/06/2019)  . ondansetron (ZOFRAN ODT) 4 MG disintegrating tablet Take 1 tablet (4 mg total) by mouth every 8 (eight) hours as needed for nausea or vomiting. (Patient not taking: Reported on 11/06/2019)  . Tiotropium Bromide Monohydrate (SPIRIVA RESPIMAT) 2.5 MCG/ACT AERS Inhale 1 puff into the lungs daily. (Patient not taking: Reported on 10/31/2019)  . traMADol (ULTRAM) 50 MG tablet Take 1 tablet (50 mg total) by mouth every 6 (six) hours as needed for moderate pain or severe  pain. (Patient not taking: Reported on 11/10/2019)   No facility-administered encounter medications on file as of 11/10/2019.    Surgical History: Past Surgical History:  Procedure Laterality Date  . ABDOMINAL HYSTERECTOMY    . APPENDECTOMY    . BREAST EXCISIONAL BIOPSY Left 80's or 90's   NEG  . CARDIAC CATHETERIZATION    . COLONOSCOPY N/A 05/22/2018   Procedure: COLONOSCOPY;  Surgeon: Lin Landsman, MD;  [facility]: [facility]  ENDOSCOPY;  Service: Gastroenterology;  Laterality: N/A;  . ENDARTERECTOMY Right 11/06/2019   Procedure: ENDARTERECTOMY CAROTID;  Surgeon: Algernon Huxley, MD;  Location: [facility] ORS;  Service: Vascular;  Laterality: Right;  . LEFT HEART CATH Bilateral 03/05/2017   Procedure: Left Heart Cath with poss PCI;  Surgeon: Wellington Hampshire, MD;  Location: [place] [facility];  Service: Cardiovascular;  Laterality: Bilateral;    Medical History: Past Medical History:  Diagnosis Date  . AAA (abdominal aortic aneurysm) ([place])    a. 05/2016 Abd U/S: 3.02 x 3.02 AAA.  Marland Kitchen Anxiety   . CAD (coronary artery disease)    a. 02/2017 NSTEMI/Cath: LM nl, LAD mild dzs, D2 min irregs, LCX mild dzs, OM2 50ost, RCA 95p, 157m, L-->R collats, EF 55-65%.  Marland Kitchen COPD (chronic obstructive pulmonary disease) ([place])   . Diastolic dysfunction    a. 02/2017 Echo: EF 55-60%, gr1 DD, ? inf HK, mild MR.  Marland Kitchen Hx of basal cell carcinoma    back   . 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 Hyperlipidemia   . Hypertension   . PAD (peripheral artery disease) ([place])    a. 05/2016 ABI's: R 1.22, L 1.11.  . Tobacco abuse     Family History: Family History  Problem Relation Age of Onset  . Breast cancer Mother   . Dementia Mother   . Hypertension Mother   . Breast cancer Maternal Aunt   . Breast cancer Maternal Aunt   . Breast cancer Maternal Aunt   . CVA Father     Social History   Socioeconomic History  . Marital status: Divorced    Spouse name: Not on file  . Number of children: Not on file  . Years of education: Not on file  . Highest education level: Not on file  Occupational History  . Not on file  Tobacco Use  . Smoking status: Former Smoker    Packs/day: 0.25    Quit date: 07/12/2017    Years since quitting: 2.3  . Smokeless tobacco: Never Used  . Tobacco comment: currently 90 days without smoking  Substance and Sexual Activity  . Alcohol use: No  . Drug use: No  . Sexual activity:  Not on file  Other Topics Concern  . Not on file  Social History Narrative  . Not on file   Social Determinants of Health   Financial Resource Strain:   . Difficulty of Paying Living Expenses:   Food Insecurity:   . Worried About Charity fundraiser in the Last Year:   . Arboriculturist in the Last Year:   Transportation Needs:   . Film/video editor (Medical):   Marland Kitchen Lack of Transportation (Non-Medical):   Physical Activity:   . Days of Exercise per Week:   . Minutes of Exercise per Session:   Stress:   . Feeling of Stress :   Social Connections:   . Frequency of Communication with Friends and Family:   . Frequency of  Social Gatherings with Friends and Family:   . Attends Religious Services:   . Active Member of Clubs or Organizations:   . Attends Archivist Meetings:   Marland Kitchen Marital Status:   Intimate Partner Violence:   . Fear of Current or Ex-Partner:   . Emotionally Abused:   Marland Kitchen Physically Abused:   . Sexually Abused:       Review of Systems  Constitutional: Positive for fatigue. Negative for activity change, chills and unexpected weight change.  HENT: Negative for congestion, postnasal drip, rhinorrhea, sneezing and sore throat.        Scratchy throat since right carotid endarterectomy.   Respiratory: Positive for cough. Negative for chest tightness, shortness of breath and wheezing.   Cardiovascular: Negative for chest pain and palpitations.  Gastrointestinal: Negative for abdominal pain, constipation, diarrhea, nausea and vomiting.  Endocrine: Negative for cold intolerance, heat intolerance, polydipsia and polyuria.  Musculoskeletal: Negative for arthralgias, back pain, joint swelling and neck pain.  Skin: Negative for rash.  Neurological: Positive for headaches. Negative for tremors and numbness.  Hematological: Negative for adenopathy. Does not bruise/bleed easily.  Psychiatric/Behavioral: Negative for behavioral problems (Depression), sleep  disturbance and suicidal ideas. The patient is not nervous/anxious.    Today's Vitals   11/10/19 1446  BP: (!) 159/73  Pulse: 80  Temp: 99.1 F (37.3 C)  Weight: 82 lb (37.2 kg)  Height: 5' (1.524 m)   Body mass index is 16.01 kg/m.    Observation/Objective:   The patient is alert and oriented. She is pleasant and answers all questions appropriately. Breathing is non-labored. She is in no acute distress at this time. Her voice is slightly hoarse, but she is dong well.    Assessment/Plan: 1. Hospital discharge follow-up Patient had right carotid endarterectomy on 11/06/2019. She is recovering well.   2. S/P carotid endarterectomy Patient had right carotid endarterectomy on 11/06/2019. She is recovering well.  Advised her to restart Plavix today.   3. Cough New rx for tessalon perls sent to her pharmacy. She may take this twice daily as needed.  - benzonatate (TESSALON) 200 MG capsule; Take 1 capsule (200 mg total) by mouth 2 (two) times daily as needed for cough.  Dispense: 45 capsule; Refill: 1  4. Benign hypertension Has improved since surgery. She should continue to monitor closely. Restart blood pressure medication as indicated.   General Counseling: Kaida verbalizes understanding of the findings of today's phone visit and agrees with plan of treatment. I have discussed any further diagnostic evaluation that may be needed or ordered today. We also reviewed her medications today. she has been encouraged to call the office with any questions or concerns that should arise related to todays visit.   This patient was seen by [place] with Dr Lavera Guise as a part of collaborative care agreement  Meds ordered this encounter  Medications  . benzonatate (TESSALON) 200 MG capsule    Sig: Take 1 capsule (200 mg total) by mouth 2 (two) times daily as needed for cough.    Dispense:  45 capsule    Refill:  1    Order Specific Question:   Supervising Provider     Answer:   Lavera Guise [[id number]]    Time spent: 61 Minutes    Dr Lavera Guise Internal medicine

## 2019-11-10 NOTE — Telephone Encounter (Signed)
Patient daughter called asking should her mother continue taking Clopidgrel after her CEA that was done on 11/06/19 and I informed her that she continue taking Clopidgrel. The patient daughter informed that since last night her mother has a fever the highest 99.8,chills for 2 days,cough,but she contacted her PCP about what is going on. The daughter informed that her mother has had her Covid-19 vaccine,flu vaccine,and pneumonia vaccine.

## 2019-11-11 NOTE — Addendum Note (Signed)
Addended by: Lavera Guise on: 11/11/2019 09:45 AM   Modules accepted: Level of Service

## 2019-12-08 ENCOUNTER — Other Ambulatory Visit (INDEPENDENT_AMBULATORY_CARE_PROVIDER_SITE_OTHER): Payer: Self-pay | Admitting: Vascular Surgery

## 2019-12-08 ENCOUNTER — Encounter (INDEPENDENT_AMBULATORY_CARE_PROVIDER_SITE_OTHER): Payer: Self-pay | Admitting: Nurse Practitioner

## 2019-12-08 ENCOUNTER — Ambulatory Visit (INDEPENDENT_AMBULATORY_CARE_PROVIDER_SITE_OTHER): Payer: Medicare Other

## 2019-12-08 ENCOUNTER — Other Ambulatory Visit: Payer: Self-pay

## 2019-12-08 ENCOUNTER — Ambulatory Visit (INDEPENDENT_AMBULATORY_CARE_PROVIDER_SITE_OTHER): Payer: Medicare Other | Admitting: Nurse Practitioner

## 2019-12-08 VITALS — BP 205/100 | HR 90 | Ht 60.0 in | Wt 79.0 lb

## 2019-12-08 DIAGNOSIS — I6521 Occlusion and stenosis of right carotid artery: Secondary | ICD-10-CM

## 2019-12-08 DIAGNOSIS — Z9889 Other specified postprocedural states: Secondary | ICD-10-CM | POA: Diagnosis not present

## 2019-12-08 DIAGNOSIS — I10 Essential (primary) hypertension: Secondary | ICD-10-CM

## 2019-12-08 DIAGNOSIS — J449 Chronic obstructive pulmonary disease, unspecified: Secondary | ICD-10-CM

## 2019-12-08 DIAGNOSIS — I6523 Occlusion and stenosis of bilateral carotid arteries: Secondary | ICD-10-CM

## 2019-12-10 ENCOUNTER — Encounter (INDEPENDENT_AMBULATORY_CARE_PROVIDER_SITE_OTHER): Payer: Self-pay | Admitting: Nurse Practitioner

## 2019-12-10 NOTE — Progress Notes (Signed)
Subjective:    Patient ID: Christina Hester, female    DOB: 11-29-1940, 79 y.o.   MRN: [id number] Chief Complaint  Patient presents with  . Follow-up    u/s follow up    The patient is seen for follow up evaluation of carotid stenosis status post right carotid endarterectomy on 11/06/19.  There were no post operative problems or complications related to the surgery.  The patient denies neck or incisional pain.  The patient continues to have some numbness of the face area.  She denies any significant difficulties with swallowing.  The patient denies interval amaurosis fugax. There is no recent history of TIA symptoms or focal motor deficits. There is no prior documented CVA.  The patient denies headache.  The patient is taking enteric-coated aspirin 81 mg daily.  The patient has a history of coronary artery disease, no recent episodes of angina or shortness of breath. The patient denies PAD or claudication symptoms. There is a history of hyperlipidemia which is being treated with a statin.   Today noninvasive study shows the right internal carotid artery is patent with no evidence of restenosis.  The left internal carotid artery has 1 to 39% stenosis.   Review of Systems  Skin: Positive for wound.  Neurological: Positive for numbness.  All other systems reviewed and are negative.      Objective:   Physical Exam Constitutional:      Appearance: Normal appearance.  HENT:     Head: Normocephalic.  Cardiovascular:     Rate and Rhythm: Normal rate and regular rhythm.     Pulses: Normal pulses.     Heart sounds: Normal heart sounds.  Pulmonary:     Effort: Pulmonary effort is normal.     Breath sounds: Normal breath sounds.  Skin:    Comments: Incision her right neck, clean dry and intact, well approximated.  Neurological:     Mental Status: She is alert and oriented to person, place, and time.  Psychiatric:        Mood and Affect: Mood normal.        Behavior: Behavior  normal.        Thought Content: Thought content normal.        Judgment: Judgment normal.     BP (!) 205/100   Pulse 90   Ht 5' (1.524 m)   Wt 79 lb (35.8 kg)   BMI 15.43 kg/m   Past Medical History:  Diagnosis Date  . AAA (abdominal aortic aneurysm) ([place])    a. 05/2016 Abd U/S: 3.02 x 3.02 AAA.  Marland Kitchen Anxiety   . CAD (coronary artery disease)    a. 02/2017 NSTEMI/Cath: LM nl, LAD mild dzs, D2 min irregs, LCX mild dzs, OM2 50ost, RCA 95p, 132m, L-->R collats, EF 55-65%.  Marland Kitchen COPD (chronic obstructive pulmonary disease) ([place])   . Diastolic dysfunction    a. 02/2017 Echo: EF 55-60%, gr1 DD, ? inf HK, mild MR.  Marland Kitchen Hx of basal cell carcinoma    back   . 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 Hyperlipidemia   . Hypertension   . PAD (peripheral artery disease) ([place])    a. 05/2016 ABI's: R 1.22, L 1.11.  . Tobacco abuse     Social History   Socioeconomic History  . Marital status: Divorced    Spouse name: Not on file  . Number of children: Not on file  . Years of education: Not on  file  . Highest education level: Not on file  Occupational History  . Not on file  Tobacco Use  . Smoking status: Former Smoker    Packs/day: 0.25    Quit date: 07/12/2017    Years since quitting: 2.4  . Smokeless tobacco: Never Used  . Tobacco comment: currently 90 days without smoking  Substance and Sexual Activity  . Alcohol use: No  . Drug use: No  . Sexual activity: Not on file  Other Topics Concern  . Not on file  Social History Narrative  . Not on file   Social Determinants of Health   Financial Resource Strain:   . Difficulty of Paying Living Expenses:   Food Insecurity:   . Worried About Charity fundraiser in the Last Year:   . Arboriculturist in the Last Year:   Transportation Needs:   . Film/video editor (Medical):   Marland Kitchen Lack of Transportation (Non-Medical):   Physical Activity:   . Days of Exercise per Week:   . Minutes of Exercise per  Session:   Stress:   . Feeling of Stress :   Social Connections:   . Frequency of Communication with Friends and Family:   . Frequency of Social Gatherings with Friends and Family:   . Attends Religious Services:   . Active Member of Clubs or Organizations:   . Attends Archivist Meetings:   Marland Kitchen Marital Status:   Intimate Partner Violence:   . Fear of Current or Ex-Partner:   . Emotionally Abused:   Marland Kitchen Physically Abused:   . Sexually Abused:     Past Surgical History:  Procedure Laterality Date  . ABDOMINAL HYSTERECTOMY    . APPENDECTOMY    . BREAST EXCISIONAL BIOPSY Left 80's or 90's   NEG  . CARDIAC CATHETERIZATION    . COLONOSCOPY N/A 05/22/2018   Procedure: COLONOSCOPY;  Surgeon: Lin Landsman, MD;  Location: [facility] ENDOSCOPY;  Service: Gastroenterology;  Laterality: N/A;  . ENDARTERECTOMY Right 11/06/2019   Procedure: ENDARTERECTOMY CAROTID;  Surgeon: Algernon Huxley, MD;  Location: [facility] ORS;  Service: Vascular;  Laterality: Right;  . LEFT HEART CATH Bilateral 03/05/2017   Procedure: Left Heart Cath with poss PCI;  Surgeon: Wellington Hampshire, MD;  Location: [place] [facility];  Service: Cardiovascular;  Laterality: Bilateral;    Family History  Problem Relation Age of Onset  . Breast cancer Mother   . Dementia Mother   . Hypertension Mother   . Breast cancer Maternal Aunt   . Breast cancer Maternal Aunt   . Breast cancer Maternal Aunt   . CVA Father     Allergies  Allergen Reactions  . Aspirin Tinitus  . Prednisone Other (See Comments)    Makes violent  . Sulfa Antibiotics Nausea And Vomiting  . Symbicort [Budesonide-Formoterol Fumarate] Other (See Comments)    Patient felt "out of her mind" and angry.       Assessment & Plan:   1. Bilateral carotid artery stenosis Recommend:  The patient is s/p successful right CEA  Duplex ultrasound preoperatively shows 1 to 39% contralateral stenosis.  Continue antiplatelet therapy as prescribed Continue  management of CAD, HTN and Hyperlipidemia Healthy heart diet,  encouraged exercise at least 4 times per week  Follow up in 3 months with duplex ultrasound and physical exam based on the patient's carotid surgery    2. Benign hypertension Today the patient has significantly elevated blood pressure but she does have a  known issue with whitecoat syndrome.  The patient does note that prior to her office visit her systolic blood pressure was in the 130s.  Patient will retake her blood pressure when she returns home, she is advised that if her blood pressure is elevated she should seek emergency attention.  Otherwise, continue antihypertensive medications as already ordered, these medications have been reviewed and there are no changes at this time.   3. COPD with chronic bronchitis and emphysema ([place]) Continue pulmonary medications and aerosols as already ordered, these medications have been reviewed and there are no changes at this time.     Current Outpatient Medications on File Prior to Visit  Medication Sig Dispense Refill  . amLODipine (NORVASC) 5 MG tablet Take one tab po qhs 90 tablet 3  . atorvastatin (LIPITOR) 10 MG tablet Take 1 tablet (10 mg total) by mouth daily. 90 tablet 3  . benzonatate (TESSALON) 200 MG capsule Take 1 capsule (200 mg total) by mouth 2 (two) times daily as needed for cough. 45 capsule 1  . buPROPion (WELLBUTRIN SR) 100 MG 12 hr tablet Take 1 tablet (100 mg total) by mouth daily. 90 tablet 3  . clopidogrel (PLAVIX) 75 MG tablet Take 1 tablet (75 mg total) by mouth daily. 90 tablet 3  . clotrimazole (MYCELEX) 10 MG troche DISSOLVE 1 TABLET BY MOUTH 5 TIMES DAILY 70 tablet 0  . ipratropium-albuterol (DUONEB) 0.5-2.5 (3) MG/3ML SOLN Take 3 mLs by nebulization every 4 (four) hours. 360 mL 3  . LORazepam (ATIVAN) 0.5 MG tablet TAKE 1/2 TO 1 TABLET BY MOUTH AT NIGHT (Patient taking differently: at bedtime as needed. TAKE 1/2 TO 1 TABLET BY MOUTH AT NIGHT) 30 tablet 2  .  nitroGLYCERIN (NITROSTAT) 0.4 MG SL tablet Place 1 tablet (0.4 mg total) under the tongue every 5 (five) minutes as needed for chest pain. 20 tablet 12  . ondansetron (ZOFRAN ODT) 4 MG disintegrating tablet Take 1 tablet (4 mg total) by mouth every 8 (eight) hours as needed for nausea or vomiting. 20 tablet 0  . Tiotropium Bromide Monohydrate (SPIRIVA RESPIMAT) 2.5 MCG/ACT AERS Inhale 1 puff into the lungs daily. 30 Inhaler 5  . VENTOLIN HFA 108 (90 Base) MCG/ACT inhaler INHALE 2 PUFFS INTO THE LUNGS EVERY 6 HOURS AS NEEDED FOR WHEEZING ORSHORTNESS OF BREATH 18 g 3  . traMADol (ULTRAM) 50 MG tablet Take 1 tablet (50 mg total) by mouth every 6 (six) hours as needed for moderate pain or severe pain. (Patient not taking: Reported on 11/10/2019) 20 tablet 0   No current facility-administered medications on file prior to visit.    There are no Patient Instructions on file for this visit. No follow-ups on file.   Kris Hartmann, NP

## 2019-12-19 ENCOUNTER — Telehealth: Payer: Self-pay

## 2019-12-19 NOTE — Telephone Encounter (Signed)
Todd from [employer] called stating that patient is out of her ativan and believes that someone in her family has taken medication from her. Sherren Mocha stated that because of this her ativan needed to be filled earlier and todd believes that pt is telling the truth and is ok with that she will have to be paying out of pocket.  Spoke with [name] and called the pharmacy to give the verbal ok from [place] to fill the ativan earlier.   Called pt per [name] and advised pt that she should have a plan to make sure this is avoided in the future and pt is aware and will not have this happen again.

## 2020-01-07 ENCOUNTER — Telehealth: Payer: Self-pay

## 2020-01-07 NOTE — Telephone Encounter (Signed)
Confirmed and screened for 01-09-20 ov.

## 2020-01-09 ENCOUNTER — Encounter: Payer: Self-pay | Admitting: Nurse Practitioner

## 2020-01-09 ENCOUNTER — Ambulatory Visit (INDEPENDENT_AMBULATORY_CARE_PROVIDER_SITE_OTHER): Payer: Medicare Other | Admitting: Nurse Practitioner

## 2020-01-09 ENCOUNTER — Other Ambulatory Visit: Payer: Self-pay

## 2020-01-09 VITALS — BP 177/85 | HR 82 | Temp 97.4°F | Resp 16 | Ht 60.0 in | Wt 79.0 lb

## 2020-01-09 DIAGNOSIS — J449 Chronic obstructive pulmonary disease, unspecified: Secondary | ICD-10-CM

## 2020-01-09 DIAGNOSIS — I10 Essential (primary) hypertension: Secondary | ICD-10-CM | POA: Diagnosis not present

## 2020-01-09 DIAGNOSIS — B370 Candidal stomatitis: Secondary | ICD-10-CM

## 2020-01-09 DIAGNOSIS — Z0001 Encounter for general adult medical examination with abnormal findings: Secondary | ICD-10-CM

## 2020-01-09 DIAGNOSIS — F411 Generalized anxiety disorder: Secondary | ICD-10-CM | POA: Diagnosis not present

## 2020-01-09 DIAGNOSIS — R300 Dysuria: Secondary | ICD-10-CM

## 2020-01-09 MED ORDER — LORAZEPAM 0.5 MG PO TABS: 0.5000 mg | ORAL_TABLET | Freq: Every evening | ORAL | 3 refills | Status: DC | PRN

## 2020-01-09 MED ORDER — NYSTATIN 100000 UNIT/ML MT SUSP: 5.0000 mL | Freq: Four times a day (QID) | OROMUCOSAL | 1 refills | Status: DC

## 2020-01-09 MED ORDER — AMLODIPINE BESYLATE 5 MG PO TABS: ORAL_TABLET | ORAL | 3 refills | Status: DC

## 2020-01-09 NOTE — Progress Notes (Signed)
[facility] [place], [place] [address]  Internal MEDICINE  Office Visit Note  Patient Name: Christina Hester  [id number]  [id number]  Date of Service: 01/18/2020   Pt is here for routine health maintenance examination  Chief Complaint  Patient presents with  . Medicare Wellness  . Quality Metric Gaps    needs cologuard  . Hyperlipidemia  . Hypertension  . COPD     The patient is here for health maintenance exam. Blood pressure is slightly elevated today. She states that she has been monitoring blood pressure every morning and it is running in the 150s near 160s every morning. She is currently taking amlodipine 5mg  every evening.  Had right carotid endarterectomy in April. She is following up with vein and vascular. Doing very well. Has another follow up visit in two months. Scar is healing well and she feels well. Still gets a little tired with exertion.  Would like to have prescription for liquid nystatin due to frequent thrush infection. Recommended by her dentist. This way she can soak dentures and toothbrush with suspension as well.  Due to have cologuard test.     Current Medication: Outpatient Encounter Medications as of 01/09/2020  Medication Sig  . amLODipine (NORVASC) 5 MG tablet Take 1.5 tablets po qhs  . atorvastatin (LIPITOR) 10 MG tablet Take 1 tablet (10 mg total) by mouth daily.  . benzonatate (TESSALON) 200 MG capsule Take 1 capsule (200 mg total) by mouth 2 (two) times daily as needed for cough.  Marland Kitchen buPROPion (WELLBUTRIN SR) 100 MG 12 hr tablet Take 1 tablet (100 mg total) by mouth daily.  . clopidogrel (PLAVIX) 75 MG tablet Take 1 tablet (75 mg total) by mouth daily.  . clotrimazole (MYCELEX) 10 MG troche DISSOLVE 1 TABLET BY MOUTH 5 TIMES DAILY  . ipratropium-albuterol (DUONEB) 0.5-2.5 (3) MG/3ML SOLN Take 3 mLs by nebulization every 4 (four) hours.  Marland Kitchen LORazepam (ATIVAN) 0.5 MG tablet Take 1 tablet (0.5 mg total) by mouth at bedtime  as needed. TAKE 1/2 TO 1 TABLET BY MOUTH AT NIGHT  . nitroGLYCERIN (NITROSTAT) 0.4 MG SL tablet Place 1 tablet (0.4 mg total) under the tongue every 5 (five) minutes as needed for chest pain.  Marland Kitchen ondansetron (ZOFRAN ODT) 4 MG disintegrating tablet Take 1 tablet (4 mg total) by mouth every 8 (eight) hours as needed for nausea or vomiting.  . Tiotropium Bromide Monohydrate (SPIRIVA RESPIMAT) 2.5 MCG/ACT AERS Inhale 1 puff into the lungs daily.  . traMADol (ULTRAM) 50 MG tablet Take 1 tablet (50 mg total) by mouth every 6 (six) hours as needed for moderate pain or severe pain.  . [DISCONTINUED] amLODipine (NORVASC) 5 MG tablet Take one tab po qhs  . [DISCONTINUED] LORazepam (ATIVAN) 0.5 MG tablet TAKE 1/2 TO 1 TABLET BY MOUTH AT NIGHT (Patient taking differently: at bedtime as needed. TAKE 1/2 TO 1 TABLET BY MOUTH AT NIGHT)  . [DISCONTINUED] VENTOLIN HFA 108 (90 Base) MCG/ACT inhaler INHALE 2 PUFFS INTO THE LUNGS EVERY 6 HOURS AS NEEDED FOR WHEEZING ORSHORTNESS OF BREATH  . nystatin (MYCOSTATIN) 100000 UNIT/ML suspension Take 5 mLs (500,000 Units total) by mouth 4 (four) times daily.   No facility-administered encounter medications on file as of 01/09/2020.    Surgical History: Past Surgical History:  Procedure Laterality Date  . ABDOMINAL HYSTERECTOMY    . APPENDECTOMY    . BREAST EXCISIONAL BIOPSY Left 80's or 90's   NEG  . CARDIAC CATHETERIZATION    .  COLONOSCOPY N/A 05/22/2018   Procedure: COLONOSCOPY;  Surgeon: Lin Landsman, MD;  Location: [facility] ENDOSCOPY;  Service: Gastroenterology;  Laterality: N/A;  . ENDARTERECTOMY Right 11/06/2019   Procedure: ENDARTERECTOMY CAROTID;  Surgeon: Algernon Huxley, MD;  Location: [facility] ORS;  Service: Vascular;  Laterality: Right;  . LEFT HEART CATH Bilateral 03/05/2017   Procedure: Left Heart Cath with poss PCI;  Surgeon: Wellington Hampshire, MD;  Location: [place] [facility];  Service: Cardiovascular;  Laterality: Bilateral;    Medical History: Past  Medical History:  Diagnosis Date  . AAA (abdominal aortic aneurysm) ([place])    a. 05/2016 Abd U/S: 3.02 x 3.02 AAA.  Marland Kitchen Anxiety   . CAD (coronary artery disease)    a. 02/2017 NSTEMI/Cath: LM nl, LAD mild dzs, D2 min irregs, LCX mild dzs, OM2 50ost, RCA 95p, 138m, L-->R collats, EF 55-65%.  Marland Kitchen COPD (chronic obstructive pulmonary disease) ([place])   . Diastolic dysfunction    a. 02/2017 Echo: EF 55-60%, gr1 DD, ? inf HK, mild MR.  Marland Kitchen Hx of basal cell carcinoma    back   . 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 Hyperlipidemia   . Hypertension   . PAD (peripheral artery disease) ([place])    a. 05/2016 ABI's: R 1.22, L 1.11.  . Tobacco abuse     Family History: Family History  Problem Relation Age of Onset  . Breast cancer Mother   . Dementia Mother   . Hypertension Mother   . Breast cancer Maternal Aunt   . Breast cancer Maternal Aunt   . Breast cancer Maternal Aunt   . CVA Father       Review of Systems  Constitutional: Positive for fatigue. Negative for activity change, chills and unexpected weight change.       States that she still gets easily fatigued since have carotid endarterectomy.   HENT: Negative for congestion, postnasal drip, rhinorrhea, sneezing and sore throat.        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   Respiratory: Positive for wheezing. Negative for cough, chest tightness and shortness of breath.        Intermittent. Well managed on current medications.   Cardiovascular: Negative for chest pain and palpitations.  Gastrointestinal: Negative for abdominal pain, constipation, diarrhea, nausea and vomiting.  Endocrine: Negative for cold intolerance, heat intolerance, polydipsia and polyuria.  Genitourinary: Negative for dysuria and frequency.  Musculoskeletal: Negative for arthralgias, back pain, joint swelling and neck pain.  Skin: Negative for rash.  Allergic/Immunologic: Negative for environmental allergies.   Neurological: Negative for dizziness, tremors, numbness and headaches.  Hematological: Negative for adenopathy. Does not bruise/bleed easily.  Psychiatric/Behavioral: Negative for behavioral problems (Depression), sleep disturbance and suicidal ideas. The patient is nervous/anxious.      Today's Vitals   01/09/20 1110  BP: (!) 177/85  Pulse: 82  Resp: 16  Temp: (!) 97.4 F (36.3 C)  SpO2: 96%  Weight: 79 lb (35.8 kg)  Height: 5' (1.524 m)   Body mass index is 15.43 kg/m.  Physical Exam Vitals and nursing note reviewed.  Constitutional:      General: She is not in acute distress.    Appearance: Normal appearance. She is well-developed. She is not diaphoretic.  HENT:     Head: Normocephalic and atraumatic.     Nose: Nose normal.     Mouth/Throat:     Pharynx: No oropharyngeal exudate.  Eyes:  Pupils: Pupils are equal, round, and reactive to light.  Neck:     Thyroid: No thyromegaly.     Vascular: No JVD.     Trachea: No tracheal deviation.  Cardiovascular:     Rate and Rhythm: Normal rate and regular rhythm.     Heart sounds: Normal heart sounds. No murmur heard.  No friction rub. No gallop.   Pulmonary:     Effort: Pulmonary effort is normal. No respiratory distress.     Breath sounds: Normal breath sounds. No wheezing or rales.  Chest:     Chest wall: No tenderness.  Abdominal:     General: Bowel sounds are normal.     Palpations: Abdomen is soft.     Tenderness: There is no abdominal tenderness.  Musculoskeletal:        General: Normal range of motion.     Cervical back: Normal range of motion and neck supple.  Lymphadenopathy:     Cervical: No cervical adenopathy.  Skin:    General: Skin is warm and dry.  Neurological:     General: No focal deficit present.     Mental Status: She is alert and oriented to person, place, and time.     Cranial Nerves: No cranial nerve deficit.  Psychiatric:        Mood and Affect: Mood normal.        Behavior:  Behavior normal.        Thought Content: Thought content normal.        Judgment: Judgment normal.    Depression screen [facility] 2/9 01/09/2020 09/08/2019 05/08/2019 01/06/2019 11/01/2018  Decreased Interest 0 0 0 0 0  Down, Depressed, Hopeless 0 0 0 0 0  PHQ - 2 Score 0 0 0 0 0    Functional Status Survey: Is the patient deaf or have difficulty hearing?: No Does the patient have difficulty seeing, even when wearing glasses/contacts?: No Does the patient have difficulty concentrating, remembering, or making decisions?: No Does the patient have difficulty walking or climbing stairs?: No Does the patient have difficulty dressing or bathing?: No Does the patient have difficulty doing errands alone such as visiting a doctor's office or shopping?: No  MMSE - [place] Exam 01/09/2020 01/06/2019 12/27/2017  Orientation to time 5 5 5   Orientation to Place 5 5 5   Registration 3 3 3   Attention/ Calculation 5 5 5   Recall 3 3 3   Language- name 2 objects 2 2 2   Language- repeat 1 1 1   Language- follow 3 step command 3 3 3   Language- read & follow direction 1 1 1   Write a sentence 1 1 1   Copy design 1 1 1   Total score 30 30 30     Fall Risk  01/09/2020 09/08/2019 05/08/2019 01/06/2019 11/01/2018  Falls in the past year? 0 0 0 0 0  Number falls in past yr: - - - - -  Injury with Fall? - - - - -      LABS: Recent Results (from the past 2160 hour(s))  SARS CORONAVIRUS 2 (TAT 6-24 HRS) Nasopharyngeal Nasopharyngeal Swab     Status: None   Collection Time: 11/04/19 11:22 AM   Specimen: Nasopharyngeal Swab  Result Value Ref Range   SARS Coronavirus 2 NEGATIVE NEGATIVE    Comment: (NOTE) SARS-CoV-2 target nucleic acids are NOT DETECTED. The SARS-CoV-2 RNA is generally detectable in upper and lower respiratory specimens during the acute phase of infection. Negative results do not preclude SARS-CoV-2 infection, do  not rule out co-infections with other pathogens, and should not be used as  the sole basis for treatment or other patient management decisions. Negative results must be combined with clinical observations, patient history, and epidemiological information. The expected result is Negative. Fact Sheet for Patients: SugarRoll.be Fact Sheet for Healthcare Providers: https://www.woods-mathews.com/ This test is not yet approved or cleared by the [place] FDA and  has been authorized for detection and/or diagnosis of SARS-CoV-2 by FDA under an Emergency Use Authorization (EUA). This EUA will remain  in effect (meaning this test can be used) for the duration of the COVID-19 declaration under Section 56 4(b)(1) of the Act, 21 U.S.C. section 360bbb-3(b)(1), unless the authorization is terminated or revoked sooner. Performed at [facility], [place] [address] [place], [place] [address]   APTT     Status: None   Collection Time: 11/04/19 11:26 AM  Result Value Ref Range   aPTT 35 24 - 36 seconds    Comment: Performed at [facility], [address] [place], [place] [address]  Basic metabolic panel     Status: None   Collection Time: 11/04/19 11:26 AM  Result Value Ref Range   Sodium 137 135 - 145 mmol/L   Potassium 4.1 3.5 - 5.1 mmol/L   Chloride 101 98 - 111 mmol/L   CO2 28 22 - 32 mmol/L   Glucose, Bld 93 70 - 99 mg/dL    Comment: Glucose reference range applies only to samples taken after fasting for at least 8 hours.   BUN 16 8 - 23 mg/dL   Creatinine, Ser 0.67 0.44 - 1.00 mg/dL   Calcium 9.8 8.9 - 10.3 mg/dL   GFR calc non Af Amer >60 >60 mL/min   GFR calc Af Amer >60 >60 mL/min   Anion gap 8 5 - 15    Comment: Performed at [facility], [address] [place], [place] [address]  CBC WITH DIFFERENTIAL     Status: None   Collection Time: 11/04/19 11:26 AM  Result Value Ref Range   WBC 5.5 4.0 - 10.5 K/uL   RBC 4.15 3.87 - 5.11 MIL/uL   Hemoglobin 12.6 12.0 - 15.0 g/dL   HCT 37.1 36 -  46 %   MCV 89.4 80.0 - 100.0 fL   MCH 30.4 26.0 - 34.0 pg   MCHC 34.0 30.0 - 36.0 g/dL   RDW 12.9 11.5 - 15.5 %   Platelets 242 150 - 400 K/uL   nRBC 0.0 0.0 - 0.2 %   Neutrophils Relative % 48 %   Neutro Abs 2.7 1.7 - 7.7 K/uL   Lymphocytes Relative 37 %   Lymphs Abs 2.0 0.7 - 4.0 K/uL   Monocytes Relative 11 %   Monocytes Absolute 0.6 0 - 1 K/uL   Eosinophils Relative 3 %   Eosinophils Absolute 0.1 0 - 0 K/uL   Basophils Relative 1 %   Basophils Absolute 0.0 0 - 0 K/uL   Immature Granulocytes 0 %   Abs Immature Granulocytes 0.02 0.00 - 0.07 K/uL    Comment: Performed at [facility], [address] [place], [place] [address]  Protime-INR     Status: None   Collection Time: 11/04/19 11:26 AM  Result Value Ref Range   Prothrombin Time 13.5 11.4 - 15.2 seconds   INR 1.0 0.8 - 1.2    Comment: (NOTE) INR goal varies based on device and disease states. Performed at [facility], [address] [place], [place] [address]   Type and screen     Status: None   Collection Time: 11/04/19 11:26 AM  Result Value Ref Range   ABO/RH(D) A POS    Antibody Screen NEG    Sample Expiration 11/18/2019,2359    Extend sample reason      NO TRANSFUSIONS OR PREGNANCY IN THE PAST 3 MONTHS Performed at [facility], [address] [place], [place] [address]   Surgical pathology     Status: None   Collection Time: 11/06/19  2:15 PM  Result Value Ref Range   SURGICAL PATHOLOGY      SURGICAL PATHOLOGY CASE: [id number] PATIENT: Burley Saver Surgical Pathology Report     Specimen Submitted: A. Plaque, right carotid  Clinical History: Carotid artery stenosis      DIAGNOSIS: A. PLAQUE, RIGHT CAROTID; ENDARTERECTOMY: - CALCIFIED ATHEROMATOUS PLAQUE.   GROSS DESCRIPTION: A. Labeled: Right carotid plaque Received: Formalin Tissue fragment(s): 1 Size: 5.0 x 1.1 x 0.5 cm Description: Received is a tubular-shaped fragment of tan-yellow  soft tissue.  Sectioning displays multifocal areas of yellow discoloration and calcification.  Representative sections are submitted in cassette 1 (following decalcification).      Final Diagnosis performed by Betsy Pries, MD.   Electronically signed 11/10/2019 10:35:12AM The electronic signature indicates that the named Attending Pathologist has evaluated the specimen Technical component performed at [facility], [address] [place], [place] [address] Lab: [phone] Dir: Rush Farmer, MD, MM M  Professional component performed at [facility], [facility], [place], [place], [place] [address] Lab: [phone] Dir: Dellia Nims. Rubinas, MD   Glucose, capillary     Status: Abnormal   Collection Time: 11/06/19 11:10 PM  Result Value Ref Range   Glucose-Capillary 114 (H) 70 - 99 mg/dL    Comment: Glucose reference range applies only to samples taken after fasting for at least 8 hours.  MRSA PCR Screening     Status: None   Collection Time: 11/06/19 11:16 PM   Specimen: Nasal Mucosa; Nasopharyngeal  Result Value Ref Range   MRSA by PCR NEGATIVE NEGATIVE    Comment:        The GeneXpert MRSA Assay (FDA approved for NASAL specimens only), is one component of a comprehensive MRSA colonization surveillance program. It is not intended to diagnose MRSA infection nor to guide or monitor treatment for MRSA infections. Performed at [facility], [address] [place], [place] [address]   CBC     Status: Abnormal   Collection Time: 11/07/19  6:10 AM  Result Value Ref Range   WBC 5.5 4.0 - 10.5 K/uL   RBC 3.29 (L) 3.87 - 5.11 MIL/uL   Hemoglobin 10.2 (L) 12.0 - 15.0 g/dL   HCT 29.1 (L) 36 - 46 %   MCV 88.4 80.0 - 100.0 fL   MCH 31.0 26.0 - 34.0 pg   MCHC 35.1 30.0 - 36.0 g/dL   RDW 12.4 11.5 - 15.5 %   Platelets 160 150 - 400 K/uL   nRBC 0.0 0.0 - 0.2 %    Comment: Performed at [facility], [address] [place], [place] [address]   Basic metabolic panel     Status: Abnormal   Collection Time: 11/07/19  6:10 AM  Result Value Ref Range   Sodium 130 (L) 135 - 145 mmol/L   Potassium 4.3 3.5 - 5.1 mmol/L   Chloride 97 (L) 98 - 111 mmol/L   CO2 26 22 - 32 mmol/L  Glucose, Bld 106 (H) 70 - 99 mg/dL    Comment: Glucose reference range applies only to samples taken after fasting for at least 8 hours.   BUN 14 8 - 23 mg/dL   Creatinine, Ser 0.69 0.44 - 1.00 mg/dL   Calcium 8.6 (L) 8.9 - 10.3 mg/dL   GFR calc non Af Amer >60 >60 mL/min   GFR calc Af Amer >60 >60 mL/min   Anion gap 7 5 - 15    Comment: Performed at [facility], [address], [place], [place] [address]    Assessment/Plan: 1. Encounter for general adult medical examination with abnormal findings Annual health maintenance exam.   2. Essential hypertension, benign Increase amlodipine to 7.5mg  daily. Monitor blood pressure closely.  - amLODipine (NORVASC) 5 MG tablet; Take 1.5 tablets po qhs  Dispense: 90 tablet; Refill: 3  3. COPD with chronic bronchitis and emphysema (HCC) Stable. Continue inhalers and respiratory medications as prescribed.   4. Thrush New prescription for nystatin suspension given. She should swish and swallow with 18mls as needed and as prescribed . - nystatin (MYCOSTATIN) 100000 UNIT/ML suspension; Take 5 mLs (500,000 Units total) by mouth 4 (four) times daily.  Dispense: 200 mL; Refill: 1  5. Generalized anxiety disorder May take lorazepam 0.5mg  at bedtime as needed. New prescription sent to her pharmacy today.  - LORazepam (ATIVAN) 0.5 MG tablet; Take 1 tablet (0.5 mg total) by mouth at bedtime as needed. TAKE 1/2 TO 1 TABLET BY MOUTH AT NIGHT  Dispense: 30 tablet; Refill: 3   General Counseling: Luvinia verbalizes understanding of the findings of todays visit and agrees with plan of treatment. I have discussed any further diagnostic evaluation that may be needed or ordered today. We also reviewed her medications today.  she has been encouraged to call the office with any questions or concerns that should arise related to todays visit.    Counseling:  This patient was seen by [place] with Dr Lavera Guise as a part of collaborative care agreement  Meds ordered this encounter  Medications  . amLODipine (NORVASC) 5 MG tablet    Sig: Take 1.5 tablets po qhs    Dispense:  90 tablet    Refill:  3    Updating prescription. Does not need refill quite yet.    Order Specific Question:   Supervising Provider    Answer:   Lavera Guise [[id number]]  . nystatin (MYCOSTATIN) 100000 UNIT/ML suspension    Sig: Take 5 mLs (500,000 Units total) by mouth 4 (four) times daily.    Dispense:  200 mL    Refill:  1    Order Specific Question:   Supervising Provider    Answer:   Lavera Guise [[id number]]  . LORazepam (ATIVAN) 0.5 MG tablet    Sig: Take 1 tablet (0.5 mg total) by mouth at bedtime as needed. TAKE 1/2 TO 1 TABLET BY MOUTH AT NIGHT    Dispense:  30 tablet    Refill:  3    Order Specific Question:   Supervising Provider    Answer:   Lavera Guise [[id number]]    Total time spent: 92 Minutes  Time spent includes review of chart, medications, test results, and follow up plan with the patient.     Lavera Guise, MD  Internal Medicine

## 2020-01-15 ENCOUNTER — Other Ambulatory Visit: Payer: Self-pay

## 2020-01-15 DIAGNOSIS — J449 Chronic obstructive pulmonary disease, unspecified: Secondary | ICD-10-CM

## 2020-01-15 MED ORDER — ALBUTEROL SULFATE HFA 108 (90 BASE) MCG/ACT IN AERS: INHALATION_SPRAY | RESPIRATORY_TRACT | 3 refills | Status: DC

## 2020-01-18 DIAGNOSIS — B370 Candidal stomatitis: Secondary | ICD-10-CM | POA: Insufficient documentation

## 2020-01-18 DIAGNOSIS — R300 Dysuria: Secondary | ICD-10-CM | POA: Insufficient documentation

## 2020-01-26 ENCOUNTER — Other Ambulatory Visit: Payer: Self-pay | Admitting: Adult Health

## 2020-01-26 DIAGNOSIS — J449 Chronic obstructive pulmonary disease, unspecified: Secondary | ICD-10-CM

## 2020-01-26 NOTE — Telephone Encounter (Signed)
This had a interaction warning can pt have this,  DBS

## 2020-02-12 ENCOUNTER — Telehealth: Payer: Self-pay

## 2020-02-12 NOTE — Telephone Encounter (Signed)
Patient cancelled appointment on 02/20/2020 will call back to reschedule. klh

## 2020-02-20 ENCOUNTER — Ambulatory Visit: Payer: Medicare Other | Admitting: Nurse Practitioner

## 2020-02-27 ENCOUNTER — Telehealth: Payer: Self-pay

## 2020-02-27 NOTE — Telephone Encounter (Signed)
Faxed Cologuard paperwork for patient.

## 2020-03-08 ENCOUNTER — Other Ambulatory Visit (INDEPENDENT_AMBULATORY_CARE_PROVIDER_SITE_OTHER): Payer: Self-pay | Admitting: Nurse Practitioner

## 2020-03-08 DIAGNOSIS — Z9889 Other specified postprocedural states: Secondary | ICD-10-CM

## 2020-03-09 ENCOUNTER — Other Ambulatory Visit: Payer: Self-pay

## 2020-03-09 ENCOUNTER — Encounter (INDEPENDENT_AMBULATORY_CARE_PROVIDER_SITE_OTHER): Payer: Self-pay | Admitting: Vascular Surgery

## 2020-03-09 ENCOUNTER — Ambulatory Visit (INDEPENDENT_AMBULATORY_CARE_PROVIDER_SITE_OTHER): Payer: Medicare Other | Admitting: Vascular Surgery

## 2020-03-09 ENCOUNTER — Ambulatory Visit (INDEPENDENT_AMBULATORY_CARE_PROVIDER_SITE_OTHER): Payer: Medicare Other

## 2020-03-09 VITALS — BP 191/88 | HR 93 | Ht 60.0 in | Wt 78.0 lb

## 2020-03-09 DIAGNOSIS — I10 Essential (primary) hypertension: Secondary | ICD-10-CM

## 2020-03-09 DIAGNOSIS — Z9889 Other specified postprocedural states: Secondary | ICD-10-CM

## 2020-03-09 DIAGNOSIS — I714 Abdominal aortic aneurysm, without rupture, unspecified: Secondary | ICD-10-CM

## 2020-03-09 DIAGNOSIS — I739 Peripheral vascular disease, unspecified: Secondary | ICD-10-CM

## 2020-03-09 DIAGNOSIS — I6523 Occlusion and stenosis of bilateral carotid arteries: Secondary | ICD-10-CM | POA: Diagnosis not present

## 2020-03-09 NOTE — Progress Notes (Signed)
MRN : [id number]  Christina Hester is a 79 y.o. (1940-09-24) female who presents with chief complaint of  Chief Complaint  Patient presents with  . Follow-up    23mo no studies  .  History of Present Illness: Patient returns today in follow up of her carotid disease.  We follow her for many vascular issues and she is scheduled to follow-up for her aneurysm and ABIs early next year.  She underwent right carotid endarterectomy about 4 to 5 months ago.  She is doing well.  Her scar is well-healed.  She had no periprocedural complications and has no complaints today. Carotid duplex today reveals a widely patent right carotid endarterectomy and stenosis just into the 40 to 59% range on the left.  No significant progression from her previous study.  Current Outpatient Medications  Medication Sig Dispense Refill  . albuterol (VENTOLIN HFA) 108 (90 Base) MCG/ACT inhaler INHALE 2 PUFFS INTO THE LUNGS EVERY 6 HOURS AS NEEDED FOR WHEEZING ORSHORTNESS OF BREATH 18 g 3  . amLODipine (NORVASC) 5 MG tablet Take 1.5 tablets po qhs 90 tablet 3  . atorvastatin (LIPITOR) 10 MG tablet Take 1 tablet (10 mg total) by mouth daily. 90 tablet 3  . benzonatate (TESSALON) 200 MG capsule Take 1 capsule (200 mg total) by mouth 2 (two) times daily as needed for cough. 45 capsule 1  . buPROPion (WELLBUTRIN SR) 100 MG 12 hr tablet Take 1 tablet (100 mg total) by mouth daily. 90 tablet 3  . clopidogrel (PLAVIX) 75 MG tablet Take 1 tablet (75 mg total) by mouth daily. 90 tablet 3  . clotrimazole (MYCELEX) 10 MG troche DISSOLVE 1 TABLET BY MOUTH 5 TIMES DAILY 70 tablet 0  . ipratropium-albuterol (DUONEB) 0.5-2.5 (3) MG/3ML SOLN INHALE THE CONTENTS OF 1 VIAL (3 ML) VIANEBULIZER EVERY 4 HOURS 360 mL 3  . LORazepam (ATIVAN) 0.5 MG tablet Take 1 tablet (0.5 mg total) by mouth at bedtime as needed. TAKE 1/2 TO 1 TABLET BY MOUTH AT NIGHT 30 tablet 3  . nitroGLYCERIN (NITROSTAT) 0.4 MG SL tablet Place 1 tablet (0.4 mg total) under  the tongue every 5 (five) minutes as needed for chest pain. 20 tablet 12  . nystatin (MYCOSTATIN) 100000 UNIT/ML suspension Take 5 mLs (500,000 Units total) by mouth 4 (four) times daily. 200 mL 1  . ondansetron (ZOFRAN ODT) 4 MG disintegrating tablet Take 1 tablet (4 mg total) by mouth every 8 (eight) hours as needed for nausea or vomiting. 20 tablet 0  . Tiotropium Bromide Monohydrate (SPIRIVA RESPIMAT) 2.5 MCG/ACT AERS Inhale 1 puff into the lungs daily. 30 Inhaler 5  . traMADol (ULTRAM) 50 MG tablet Take 1 tablet (50 mg total) by mouth every 6 (six) hours as needed for moderate pain or severe pain. 20 tablet 0   No current facility-administered medications for this visit.    Past Medical History:  Diagnosis Date  . AAA (abdominal aortic aneurysm) ([place])    a. 05/2016 Abd U/S: 3.02 x 3.02 AAA.  Marland Kitchen Anxiety   . CAD (coronary artery disease)    a. 02/2017 NSTEMI/Cath: LM nl, LAD mild dzs, D2 min irregs, LCX mild dzs, OM2 50ost, RCA 95p, 162m, L-->R collats, EF 55-65%.  Marland Kitchen COPD (chronic obstructive pulmonary disease) ([place])   . Diastolic dysfunction    a. 02/2017 Echo: EF 55-60%, gr1 DD, ? inf HK, mild MR.  Marland Kitchen Hx of basal cell carcinoma    back   . 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 Hyperlipidemia   . Hypertension   . PAD (peripheral artery disease) ([place])    a. 05/2016 ABI's: R 1.22, L 1.11.  . Tobacco abuse     Past Surgical History:  Procedure Laterality Date  . ABDOMINAL HYSTERECTOMY    . APPENDECTOMY    . BREAST EXCISIONAL BIOPSY Left 80's or 90's   NEG  . CARDIAC CATHETERIZATION    . COLONOSCOPY N/A 05/22/2018   Procedure: COLONOSCOPY;  Surgeon: Lin Landsman, MD;  Location: [facility] ENDOSCOPY;  Service: Gastroenterology;  Laterality: N/A;  . ENDARTERECTOMY Right 11/06/2019   Procedure: ENDARTERECTOMY CAROTID;  Surgeon: Algernon Huxley, MD;  Location: [facility] ORS;  Service: Vascular;  Laterality: Right;  . LEFT HEART CATH Bilateral 03/05/2017    Procedure: Left Heart Cath with poss PCI;  Surgeon: Wellington Hampshire, MD;  Location: [place] [facility] LAB;  Service: Cardiovascular;  Laterality: Bilateral;     Social History   Tobacco Use  . Smoking status: Former Smoker    Packs/day: 0.25    Quit date: 07/12/2017    Years since quitting: 2.6  . Smokeless tobacco: Never Used  . Tobacco comment: currently 90 days without smoking  Vaping Use  . Vaping Use: Never used  Substance Use Topics  . Alcohol use: No  . Drug use: No       Family History  Problem Relation Age of Onset  . Breast cancer Mother   . Dementia Mother   . Hypertension Mother   . Breast cancer Maternal Aunt   . Breast cancer Maternal Aunt   . Breast cancer Maternal Aunt   . CVA Father   daughter with carotid disease  Allergies  Allergen Reactions  . Aspirin Tinitus  . Prednisone Other (See Comments)    Makes violent  . Sulfa Antibiotics Nausea And Vomiting  . Symbicort [Budesonide-Formoterol Fumarate] Other (See Comments)    Patient felt "out of her mind" and angry.      REVIEW OF SYSTEMS (Negative unless checked)  Constitutional: [] ?Weight loss  [] ?Fever  [] ?Chills Cardiac: [] ?Chest pain   [] ?Chest pressure   [] ?Palpitations   [] ?Shortness of breath when laying flat   [] ?Shortness of breath at rest   [] ?Shortness of breath with exertion. Vascular:  [] ?Pain in legs with walking   [] ?Pain in legs at rest   [] ?Pain in legs when laying flat   [] ?Claudication   [] ?Pain in feet when walking  [] ?Pain in feet at rest  [] ?Pain in feet when laying flat   [] ?History of DVT   [] ?Phlebitis   [] ?Swelling in legs   [] ?Varicose veins   [] ?Non-healing ulcers Pulmonary:   [] ?Uses home oxygen   [] ?Productive cough   [] ?Hemoptysis   [] ?Wheeze  [x] ?COPD   [] ?Asthma Neurologic:  [] ?Dizziness  [] ?Blackouts   [] ?Seizures   [] ?History of stroke   [] ?History of TIA  [] ?Aphasia   [] ?Temporary blindness   [] ?Dysphagia   [] ?Weakness or numbness in arms   [] ?Weakness or  numbness in legs Musculoskeletal:  [x] ?Arthritis   [] ?Joint swelling   [x] ?Joint pain   [] ?Low back pain Hematologic:  [] ?Easy bruising  [] ?Easy bleeding   [] ?Hypercoagulable state   [] ?Anemic   Gastrointestinal:  [] ?Blood in stool   [] ?Vomiting blood  [] ?Gastroesophageal reflux/heartburn   [] ?Abdominal pain Genitourinary:  [] ?Chronic kidney disease   [] ?Difficult urination  [] ?Frequent urination  [] ?Burning with urination   [] ?Hematuria Skin:  [] ?Rashes   [] ?Ulcers   [] ?Wounds Psychological:  [  x]?History of anxiety   [] ? History of major depression.  Physical Examination  BP (!) 191/88   Pulse 93   Ht 5' (1.524 m)   Wt 78 lb (35.4 kg)   BMI 15.23 kg/m  Gen:  WD/WN, NAD. Appears younger than stated age. Head: [place]/AT, No temporalis wasting. Ear/Nose/Throat: Hearing grossly intact, nares w/o erythema or drainage Eyes: Conjunctiva clear. Sclera non-icteric Neck: Supple.  Trachea midline. No bruit Pulmonary:  Good air movement, no use of accessory muscles.  Cardiac: RRR, no JVD Vascular:  Vessel Right Left  Radial Palpable Palpable       Musculoskeletal: M/S 5/5 throughout.  No deformity or atrophy. No edema. Neurologic: Sensation grossly intact in extremities.  Symmetrical.  Speech is fluent.  Psychiatric: Judgment intact, Mood & affect appropriate for pt's clinical situation. Dermatologic: No rashes or ulcers noted.  No cellulitis or open wounds.       Labs No results found for this or any previous visit (from the past 2160 hour(s)).  Radiology No results found.  Assessment/Plan Benign hypertension blood pressure control important in reducing the progression of atherosclerotic disease. On appropriate oral medications.   PVD (peripheral vascular disease) ([place]) Checked earlier this year and stable.  No disabling claudication symptoms or limb threatening symptoms.  To be followed with noninvasive studies in the future.  AAA (abdominal aortic aneurysm) without rupture  ([place]) Checked earlier this year and stable.  Being checked annually.  Coronary artery disease Has been seen by cardiology.  No issues with surgery earlier this year.  Carotid stenosis Carotid duplex today reveals a widely patent right carotid endarterectomy and stenosis just into the 40 to 59% range on the left.  No significant progression from her previous study.  We will plan to recheck this in 6 months and try to line up with her other vascular studies to save her extra visits.  No change in her medical regimen.    Leotis Pain, MD  03/09/2020 4:17 PM    This note was created with Dragon medical transcription system.  Any errors from dictation are purely unintentional

## 2020-03-09 NOTE — Assessment & Plan Note (Signed)
Carotid duplex today reveals a widely patent right carotid endarterectomy and stenosis just into the 40 to 59% range on the left.  No significant progression from her previous study.  We will plan to recheck this in 6 months and try to line up with her other vascular studies to save her extra visits.  No change in her medical regimen.

## 2020-03-11 DIAGNOSIS — Z1212 Encounter for screening for malignant neoplasm of rectum: Secondary | ICD-10-CM | POA: Diagnosis not present

## 2020-03-11 DIAGNOSIS — Z1211 Encounter for screening for malignant neoplasm of colon: Secondary | ICD-10-CM | POA: Diagnosis not present

## 2020-03-25 ENCOUNTER — Telehealth: Payer: Self-pay

## 2020-03-25 LAB — COLOGUARD: COLOGUARD: POSITIVE — AB

## 2020-03-25 NOTE — Telephone Encounter (Signed)
Pt advised cologuard came back positive as per heather we are going to send referral for GI as per pt she will call back with GI name

## 2020-04-05 ENCOUNTER — Telehealth: Payer: Self-pay

## 2020-04-05 NOTE — Telephone Encounter (Signed)
Confirmed and screened for 04-07-20 ov.

## 2020-04-06 ENCOUNTER — Encounter: Payer: Self-pay | Admitting: Nurse Practitioner

## 2020-04-06 LAB — COLOGUARD

## 2020-04-06 NOTE — Progress Notes (Signed)
Scanned in cologuard

## 2020-04-07 ENCOUNTER — Ambulatory Visit (INDEPENDENT_AMBULATORY_CARE_PROVIDER_SITE_OTHER): Payer: Medicare Other | Admitting: Hospice and Palliative Medicine

## 2020-04-07 ENCOUNTER — Encounter: Payer: Self-pay | Admitting: Hospice and Palliative Medicine

## 2020-04-07 ENCOUNTER — Other Ambulatory Visit: Payer: Self-pay

## 2020-04-07 DIAGNOSIS — Z1211 Encounter for screening for malignant neoplasm of colon: Secondary | ICD-10-CM | POA: Diagnosis not present

## 2020-04-07 DIAGNOSIS — I10 Essential (primary) hypertension: Secondary | ICD-10-CM | POA: Diagnosis not present

## 2020-04-07 MED ORDER — AMLODIPINE BESYLATE 2.5 MG PO TABS: 2.5000 mg | ORAL_TABLET | Freq: Every day | ORAL | 3 refills | Status: DC

## 2020-04-07 NOTE — Progress Notes (Signed)
[facility] [place], [place] [address]  Internal MEDICINE  Office Visit Note  Patient Name: Christina Hester  [id number]  [id number]  Date of Service: 04/09/2020  Chief Complaint  Patient presents with  . Follow-up    discuss bp and cologuard  . Hyperlipidemia  . Hypertension  . Quality Metric Gaps    TDAP    HPI Patient is here for routine follow-up She is here to discuss her BP and results of ColoGuard Her ColoGuard testing was positive, she has also been having issues with unintentional weight loss She checks her BP twice a day a time and keeps a log, averaging 140-160/80-90 HR is stable on home checks At this time she is to take 7.5 mg of amlodipine daily at bedtime, she has been unable to take the extra 2.5 mg dose as she is unable to cut the pills in half  Current Medication: Outpatient Encounter Medications as of 04/07/2020  Medication Sig  . albuterol (VENTOLIN HFA) 108 (90 Base) MCG/ACT inhaler INHALE 2 PUFFS INTO THE LUNGS EVERY 6 HOURS AS NEEDED FOR WHEEZING ORSHORTNESS OF BREATH  . amLODipine (NORVASC) 5 MG tablet Take 1.5 tablets po qhs  . benzonatate (TESSALON) 200 MG capsule Take 1 capsule (200 mg total) by mouth 2 (two) times daily as needed for cough.  Marland Kitchen buPROPion (WELLBUTRIN SR) 100 MG 12 hr tablet Take 1 tablet (100 mg total) by mouth daily.  . clopidogrel (PLAVIX) 75 MG tablet Take 1 tablet (75 mg total) by mouth daily.  . clotrimazole (MYCELEX) 10 MG troche DISSOLVE 1 TABLET BY MOUTH 5 TIMES DAILY  . ipratropium-albuterol (DUONEB) 0.5-2.5 (3) MG/3ML SOLN INHALE THE CONTENTS OF 1 VIAL (3 ML) VIANEBULIZER EVERY 4 HOURS  . LORazepam (ATIVAN) 0.5 MG tablet Take 1 tablet (0.5 mg total) by mouth at bedtime as needed. TAKE 1/2 TO 1 TABLET BY MOUTH AT NIGHT  . nitroGLYCERIN (NITROSTAT) 0.4 MG SL tablet Place 1 tablet (0.4 mg total) under the tongue every 5 (five) minutes as needed for chest pain.  Marland Kitchen nystatin (MYCOSTATIN) 100000 UNIT/ML  suspension Take 5 mLs (500,000 Units total) by mouth 4 (four) times daily.  . Tiotropium Bromide Monohydrate (SPIRIVA RESPIMAT) 2.5 MCG/ACT AERS Inhale 1 puff into the lungs daily.  Marland Kitchen amLODipine (NORVASC) 2.5 MG tablet Take 1 tablet (2.5 mg total) by mouth daily.  Marland Kitchen atorvastatin (LIPITOR) 10 MG tablet Take 1 tablet (10 mg total) by mouth daily. (Patient not taking: Reported on 04/07/2020)  . ondansetron (ZOFRAN ODT) 4 MG disintegrating tablet Take 1 tablet (4 mg total) by mouth every 8 (eight) hours as needed for nausea or vomiting. (Patient not taking: Reported on 04/07/2020)  . traMADol (ULTRAM) 50 MG tablet Take 1 tablet (50 mg total) by mouth every 6 (six) hours as needed for moderate pain or severe pain. (Patient not taking: Reported on 04/07/2020)   No facility-administered encounter medications on file as of 04/07/2020.    Surgical History: Past Surgical History:  Procedure Laterality Date  . ABDOMINAL HYSTERECTOMY    . APPENDECTOMY    . BREAST EXCISIONAL BIOPSY Left 80's or 90's   NEG  . CARDIAC CATHETERIZATION    . COLONOSCOPY N/A 05/22/2018   Procedure: COLONOSCOPY;  Surgeon: Lin Landsman, MD;  Location: [facility] ENDOSCOPY;  Service: Gastroenterology;  Laterality: N/A;  . ENDARTERECTOMY Right 11/06/2019   Procedure: ENDARTERECTOMY CAROTID;  Surgeon: Algernon Huxley, MD;  Location: [facility] ORS;  Service: Vascular;  Laterality: Right;  . LEFT  HEART CATH Bilateral 03/05/2017   Procedure: Left Heart Cath with poss PCI;  Surgeon: Wellington Hampshire, MD;  Location: [facility];  Service: Cardiovascular;  Laterality: Bilateral;    Medical History: Past Medical History:  Diagnosis Date  . AAA (abdominal aortic aneurysm) ([place])    a. 05/2016 Abd U/S: 3.02 x 3.02 AAA.  Marland Kitchen Anxiety   . CAD (coronary artery disease)    a. 02/2017 NSTEMI/Cath: LM nl, LAD mild dzs, D2 min irregs, LCX mild dzs, OM2 50ost, RCA 95p, 123m, L-->R collats, EF 55-65%.  Marland Kitchen COPD (chronic obstructive pulmonary disease)  ([place])   . Diastolic dysfunction    a. 02/2017 Echo: EF 55-60%, gr1 DD, ? inf HK, mild MR.  Marland Kitchen Hx of basal cell carcinoma    back   . 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 Hyperlipidemia   . Hypertension   . PAD (peripheral artery disease) ([place])    a. 05/2016 ABI's: R 1.22, L 1.11.  . Tobacco abuse     Family History: Family History  Problem Relation Age of Onset  . Breast cancer Mother   . Dementia Mother   . Hypertension Mother   . Breast cancer Maternal Aunt   . Breast cancer Maternal Aunt   . Breast cancer Maternal Aunt   . CVA Father     Social History   Socioeconomic History  . Marital status: Divorced    Spouse name: Not on file  . Number of children: Not on file  . Years of education: Not on file  . Highest education level: Not on file  Occupational History  . Not on file  Tobacco Use  . Smoking status: Former Smoker    Packs/day: 0.25    Quit date: 07/12/2017    Years since quitting: 2.7  . Smokeless tobacco: Never Used  . Tobacco comment: currently 90 days without smoking  Vaping Use  . Vaping Use: Never used  Substance and Sexual Activity  . Alcohol use: No  . Drug use: No  . Sexual activity: Not on file  Other Topics Concern  . Not on file  Social History Narrative  . Not on file   Social Determinants of Health   Financial Resource Strain:   . Difficulty of Paying Living Expenses: Not on file  Food Insecurity:   . Worried About Charity fundraiser in the Last Year: Not on file  . Ran Out of Food in the Last Year: Not on file  Transportation Needs:   . Lack of Transportation (Medical): Not on file  . Lack of Transportation (Non-Medical): Not on file  Physical Activity:   . Days of Exercise per Week: Not on file  . Minutes of Exercise per Session: Not on file  Stress:   . Feeling of Stress : Not on file  Social Connections:   . Frequency of Communication with Friends and Family: Not on file  . Frequency of  Social Gatherings with Friends and Family: Not on file  . Attends Religious Services: Not on file  . Active Member of Clubs or Organizations: Not on file  . Attends Archivist Meetings: Not on file  . Marital Status: Not on file  Intimate Partner Violence:   . Fear of Current or Ex-Partner: Not on file  . Emotionally Abused: Not on file  . Physically Abused: Not on file  . Sexually Abused: Not on file   Review of Systems  Constitutional: Positive for unexpected weight change. Negative for chills, diaphoresis and fatigue.  HENT: Negative for ear pain, postnasal drip and sinus pressure.   Eyes: Negative for photophobia, discharge, redness, itching and visual disturbance.  Respiratory: Negative for cough, shortness of breath and wheezing.   Cardiovascular: Negative for chest pain, palpitations and leg swelling.  Gastrointestinal: Negative for abdominal pain, constipation, diarrhea, nausea and vomiting.  Genitourinary: Negative for dysuria and flank pain.  Musculoskeletal: Negative for arthralgias, back pain, gait problem and neck pain.  Skin: Negative for color change.  Allergic/Immunologic: Negative for environmental allergies and food allergies.  Neurological: Negative for dizziness and headaches.  Hematological: Does not bruise/bleed easily.  Psychiatric/Behavioral: Negative for agitation, behavioral problems (depression) and hallucinations.    Vital Signs: BP (!) 143/69   Pulse 88   Temp 97.9 F (36.6 C)   Resp 16   Ht 5' (1.524 m)   Wt 77 lb 12.8 oz (35.3 kg)   SpO2 92%   BMI 15.19 kg/m    Physical Exam Constitutional:      Appearance: She is underweight.  HENT:     Nose: Nose normal.     Mouth/Throat:     Mouth: Mucous membranes are moist.     Pharynx: Oropharynx is clear.  Cardiovascular:     Rate and Rhythm: Normal rate and regular rhythm.     Pulses: Normal pulses.     Heart sounds: Normal heart sounds.  Pulmonary:     Effort: Pulmonary effort is  normal.     Breath sounds: Normal breath sounds.  Abdominal:     General: Abdomen is flat.     Palpations: Abdomen is soft.  Musculoskeletal:        General: Normal range of motion.     Cervical back: Normal range of motion.  Skin:    General: Skin is warm.  Neurological:     General: No focal deficit present.     Mental Status: She is alert and oriented to person, place, and time. Mental status is at baseline.  Psychiatric:        Mood and Affect: Mood normal.        Behavior: Behavior normal.        Thought Content: Thought content normal.   Assessment/Plan: 1. Colon cancer screening Will send referral to GI due to positive results on ColoGuard testing as well as reported unintentional weight loss, 5 pound weight loss in 5 months currently 77 pounds. - Ambulatory referral to Gastroenterology  2. Essential hypertension, benign Will send in 2.5 mg tablets to add to her 5 mg of Amlodipine for a total of 7.5 mg daily to be taken at night. Will assess BP response to increased dose at next follow-up visit. - amLODipine (NORVASC) 2.5 MG tablet; Take 1 tablet (2.5 mg total) by mouth daily.  Dispense: 30 tablet; Refill: 3  General Counseling: Geisha verbalizes understanding of the findings of todays visit and agrees with plan of treatment. I have discussed any further diagnostic evaluation that may be needed or ordered today. We also reviewed her medications today. she has been encouraged to call the office with any questions or concerns that should arise related to todays visit.    Orders Placed This Encounter  Procedures  . Ambulatory referral to Gastroenterology    Meds ordered this encounter  Medications  . amLODipine (NORVASC) 2.5 MG tablet    Sig: Take 1 tablet (2.5 mg total) by mouth daily.    Dispense:  30  tablet    Refill:  3    Time spent: 30 Minutes   This patient was seen by Theodoro Grist AGNP-C in Collaboration with Dr Lavera Guise as a part of collaborative care  agreement     Tanna Furry. Jannetta Massey AGNP-C Internal medicine

## 2020-04-09 ENCOUNTER — Encounter: Payer: Self-pay | Admitting: Hospice and Palliative Medicine

## 2020-05-07 DIAGNOSIS — Z23 Encounter for immunization: Secondary | ICD-10-CM | POA: Diagnosis not present

## 2020-05-19 ENCOUNTER — Other Ambulatory Visit: Payer: Self-pay

## 2020-05-19 DIAGNOSIS — F411 Generalized anxiety disorder: Secondary | ICD-10-CM

## 2020-05-19 MED ORDER — BUPROPION HCL ER (SR) 100 MG PO TB12: 100.0000 mg | ORAL_TABLET | Freq: Every day | ORAL | 3 refills | Status: DC

## 2020-05-20 DIAGNOSIS — Z23 Encounter for immunization: Secondary | ICD-10-CM | POA: Diagnosis not present

## 2020-06-09 ENCOUNTER — Ambulatory Visit (INDEPENDENT_AMBULATORY_CARE_PROVIDER_SITE_OTHER): Payer: Medicare Other | Admitting: Hospice and Palliative Medicine

## 2020-06-09 ENCOUNTER — Encounter: Payer: Self-pay | Admitting: Hospice and Palliative Medicine

## 2020-06-09 DIAGNOSIS — M2550 Pain in unspecified joint: Secondary | ICD-10-CM

## 2020-06-09 DIAGNOSIS — J449 Chronic obstructive pulmonary disease, unspecified: Secondary | ICD-10-CM | POA: Diagnosis not present

## 2020-06-09 DIAGNOSIS — I10 Essential (primary) hypertension: Secondary | ICD-10-CM

## 2020-06-09 DIAGNOSIS — J4489 Other specified chronic obstructive pulmonary disease: Secondary | ICD-10-CM

## 2020-06-09 DIAGNOSIS — G4734 Idiopathic sleep related nonobstructive alveolar hypoventilation: Secondary | ICD-10-CM

## 2020-06-09 DIAGNOSIS — F5101 Primary insomnia: Secondary | ICD-10-CM | POA: Diagnosis not present

## 2020-06-09 DIAGNOSIS — I6523 Occlusion and stenosis of bilateral carotid arteries: Secondary | ICD-10-CM | POA: Diagnosis not present

## 2020-06-09 DIAGNOSIS — J439 Emphysema, unspecified: Secondary | ICD-10-CM

## 2020-06-09 MED ORDER — MELOXICAM 15 MG PO TABS: 15.0000 mg | ORAL_TABLET | Freq: Every day | ORAL | 0 refills | Status: DC

## 2020-06-09 MED ORDER — IPRATROPIUM-ALBUTEROL 0.5-2.5 (3) MG/3ML IN SOLN: RESPIRATORY_TRACT | 3 refills | Status: DC

## 2020-06-09 MED ORDER — AMLODIPINE BESYLATE 2.5 MG PO TABS: 2.5000 mg | ORAL_TABLET | Freq: Every day | ORAL | 3 refills | Status: DC

## 2020-06-09 MED ORDER — CLOPIDOGREL BISULFATE 75 MG PO TABS: 75.0000 mg | ORAL_TABLET | Freq: Every day | ORAL | 3 refills | Status: DC

## 2020-06-09 MED ORDER — AMLODIPINE BESYLATE 5 MG PO TABS: 5.0000 mg | ORAL_TABLET | Freq: Every day | ORAL | 3 refills | Status: DC

## 2020-06-09 NOTE — Progress Notes (Signed)
[facility] [place], [place] [address]  Internal MEDICINE  Office Visit Note  Patient Name: Christina Hester  [id number]  [id number]  Date of Service: 06/15/2020  Chief Complaint  Patient presents with  . Follow-up    sleeping only 4 hours a night, trouble staying asleep  . Hyperlipidemia  . Hypertension  . Anxiety  . COPD  . policy update form    received    HPI Patient is here for routine follow-up She has set up an appt to see GI, scheduled for 12/17 due to positive [employer] screening Continues to have unintentional weight loss--has lost 7 pounds since last visit, feels discouraged as she has been increasing her amount of bread trying to gain weight Continues to deny any changes in her bowel regimen  She wants to discuss her issues with sleeping, over the last several months she has been struggling with falling and staying asleep--does not feel this is due to anxiety Averaging about 3-4 hours per night, feels tired during the day Discussed that this could be related to Wellbutrin, she does not remember why she was put on this medication in the past Willing to try to wean herself off to see if this helps improve her sleep BP has been well controlled on 7.5 mg of amlodipine, having two separate scripts has been helpful for her  She is requesting refills of Mobic, she rarely takes this medicine but likes to have some on hand for minor aches and pains--she mostly recently took Mobic for pain she was having in her neck--resolved at this time  Current Medication: Outpatient Encounter Medications as of 06/09/2020  Medication Sig  . albuterol (VENTOLIN HFA) 108 (90 Base) MCG/ACT inhaler INHALE 2 PUFFS INTO THE LUNGS EVERY 6 HOURS AS NEEDED FOR WHEEZING ORSHORTNESS OF BREATH  . benzonatate (TESSALON) 200 MG capsule Take 1 capsule (200 mg total) by mouth 2 (two) times daily as needed for cough.  Marland Kitchen buPROPion (WELLBUTRIN SR) 100 MG 12 hr tablet Take 1 tablet  (100 mg total) by mouth daily.  . clopidogrel (PLAVIX) 75 MG tablet Take 1 tablet (75 mg total) by mouth daily.  . clotrimazole (MYCELEX) 10 MG troche DISSOLVE 1 TABLET BY MOUTH 5 TIMES DAILY  . ipratropium-albuterol (DUONEB) 0.5-2.5 (3) MG/3ML SOLN INHALE THE CONTENTS OF 1 VIAL (3 ML) VIANEBULIZER EVERY 4 HOURS  . LORazepam (ATIVAN) 0.5 MG tablet Take 1 tablet (0.5 mg total) by mouth at bedtime as needed. TAKE 1/2 TO 1 TABLET BY MOUTH AT NIGHT  . nitroGLYCERIN (NITROSTAT) 0.4 MG SL tablet Place 1 tablet (0.4 mg total) under the tongue every 5 (five) minutes as needed for chest pain.  . [DISCONTINUED] amLODipine (NORVASC) 2.5 MG tablet Take 1 tablet (2.5 mg total) by mouth daily.  . [DISCONTINUED] amLODipine (NORVASC) 5 MG tablet Take 1.5 tablets po qhs  . [DISCONTINUED] clopidogrel (PLAVIX) 75 MG tablet Take 1 tablet (75 mg total) by mouth daily.  . [DISCONTINUED] ipratropium-albuterol (DUONEB) 0.5-2.5 (3) MG/3ML SOLN INHALE THE CONTENTS OF 1 VIAL (3 ML) VIANEBULIZER EVERY 4 HOURS  . amLODipine (NORVASC) 2.5 MG tablet Take 1 tablet (2.5 mg total) by mouth daily. Take along with 5 mg daily for total of 7.5 mg.  . amLODipine (NORVASC) 5 MG tablet Take 1 tablet (5 mg total) by mouth daily. Take along with 2.5 mg for a total of 7.5 mg.  . meloxicam (MOBIC) 15 MG tablet Take 1 tablet (15 mg total) by mouth daily.  Marland Kitchen  nystatin (MYCOSTATIN) 100000 UNIT/ML suspension Take 5 mLs (500,000 Units total) by mouth 4 (four) times daily. (Patient not taking: Reported on 06/09/2020)  . Tiotropium Bromide Monohydrate (SPIRIVA RESPIMAT) 2.5 MCG/ACT AERS Inhale 1 puff into the lungs daily. (Patient not taking: Reported on 06/09/2020)  . [DISCONTINUED] atorvastatin (LIPITOR) 10 MG tablet Take 1 tablet (10 mg total) by mouth daily. (Patient not taking: Reported on 04/07/2020)  . [DISCONTINUED] ondansetron (ZOFRAN ODT) 4 MG disintegrating tablet Take 1 tablet (4 mg total) by mouth every 8 (eight) hours as needed for nausea  or vomiting. (Patient not taking: Reported on 04/07/2020)  . [DISCONTINUED] traMADol (ULTRAM) 50 MG tablet Take 1 tablet (50 mg total) by mouth every 6 (six) hours as needed for moderate pain or severe pain. (Patient not taking: Reported on 04/07/2020)   No facility-administered encounter medications on file as of 06/09/2020.    Surgical History: Past Surgical History:  Procedure Laterality Date  . ABDOMINAL HYSTERECTOMY    . APPENDECTOMY    . BREAST EXCISIONAL BIOPSY Left 80's or 90's   NEG  . CARDIAC CATHETERIZATION    . COLONOSCOPY N/A 05/22/2018   Procedure: COLONOSCOPY;  Surgeon: Lin Landsman, MD;  Location: [facility] ENDOSCOPY;  Service: Gastroenterology;  Laterality: N/A;  . ENDARTERECTOMY Right 11/06/2019   Procedure: ENDARTERECTOMY CAROTID;  Surgeon: Algernon Huxley, MD;  Location: [facility] ORS;  Service: Vascular;  Laterality: Right;  . LEFT HEART CATH Bilateral 03/05/2017   Procedure: Left Heart Cath with poss PCI;  Surgeon: Wellington Hampshire, MD;  Location: [place] CV LAB;  Service: Cardiovascular;  Laterality: Bilateral;    Medical History: Past Medical History:  Diagnosis Date  . AAA (abdominal aortic aneurysm) ([place])    a. 05/2016 Abd U/S: 3.02 x 3.02 AAA.  Marland Kitchen Anxiety   . CAD (coronary artery disease)    a. 02/2017 NSTEMI/Cath: LM nl, LAD mild dzs, D2 min irregs, LCX mild dzs, OM2 50ost, RCA 95p, 15m, L-->R collats, EF 55-65%.  Marland Kitchen COPD (chronic obstructive pulmonary disease) ([place])   . Diastolic dysfunction    a. 02/2017 Echo: EF 55-60%, gr1 DD, ? inf HK, mild MR.  Marland Kitchen Hx of basal cell carcinoma    back   . 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 Hyperlipidemia   . Hypertension   . PAD (peripheral artery disease) ([place])    a. 05/2016 ABI's: R 1.22, L 1.11.  . Tobacco abuse     Family History: Family History  Problem Relation Age of Onset  . Breast cancer Mother   . Dementia Mother   . Hypertension Mother   . Breast cancer Maternal Aunt    . Breast cancer Maternal Aunt   . Breast cancer Maternal Aunt   . CVA Father     Social History   Socioeconomic History  . Marital status: Divorced    Spouse name: Not on file  . Number of children: Not on file  . Years of education: Not on file  . Highest education level: Not on file  Occupational History  . Not on file  Tobacco Use  . Smoking status: Former Smoker    Packs/day: 0.25    Quit date: 07/12/2017    Years since quitting: 2.9  . Smokeless tobacco: Never Used  . Tobacco comment: currently 90 days without smoking  Vaping Use  . Vaping Use: Never used  Substance and Sexual Activity  . Alcohol use: No  . Drug use: No  .  Sexual activity: Not on file  Other Topics Concern  . Not on file  Social History Narrative  . Not on file   Social Determinants of Health   Financial Resource Strain:   . Difficulty of Paying Living Expenses: Not on file  Food Insecurity:   . Worried About Charity fundraiser in the Last Year: Not on file  . Ran Out of Food in the Last Year: Not on file  Transportation Needs:   . Lack of Transportation (Medical): Not on file  . Lack of Transportation (Non-Medical): Not on file  Physical Activity:   . Days of Exercise per Week: Not on file  . Minutes of Exercise per Session: Not on file  Stress:   . Feeling of Stress : Not on file  Social Connections:   . Frequency of Communication with Friends and Family: Not on file  . Frequency of Social Gatherings with Friends and Family: Not on file  . Attends Religious Services: Not on file  . Active Member of Clubs or Organizations: Not on file  . Attends Archivist Meetings: Not on file  . Marital Status: Not on file  Intimate Partner Violence:   . Fear of Current or Ex-Partner: Not on file  . Emotionally Abused: Not on file  . Physically Abused: Not on file  . Sexually Abused: Not on file    Review of Systems  Constitutional: Positive for unexpected weight change. Negative for  chills, diaphoresis and fatigue.  HENT: Negative for ear pain, postnasal drip and sinus pressure.   Eyes: Negative for photophobia, discharge, redness, itching and visual disturbance.  Respiratory: Negative for cough, shortness of breath and wheezing.   Cardiovascular: Negative for chest pain, palpitations and leg swelling.  Gastrointestinal: Negative for abdominal pain, constipation, diarrhea, nausea and vomiting.  Genitourinary: Negative for dysuria and flank pain.  Musculoskeletal: Negative for arthralgias, back pain, gait problem and neck pain.  Skin: Negative for color change.  Allergic/Immunologic: Negative for environmental allergies and food allergies.  Neurological: Negative for dizziness and headaches.  Hematological: Does not bruise/bleed easily.  Psychiatric/Behavioral: Positive for sleep disturbance. Negative for agitation, behavioral problems (depression) and hallucinations.    Vital Signs: BP 128/70   Pulse 100   Temp (!) 97.1 F (36.2 C)   Resp 16   Ht 5' (1.524 m)   Wt 81 lb (36.7 kg)   SpO2 97%   BMI 15.82 kg/m    Physical Exam Vitals reviewed.  Constitutional:      Appearance: Normal appearance.  Cardiovascular:     Rate and Rhythm: Normal rate and regular rhythm.     Pulses: Normal pulses.     Heart sounds: Normal heart sounds.  Pulmonary:     Effort: Pulmonary effort is normal.     Breath sounds: Normal breath sounds.  Abdominal:     General: Abdomen is flat.  Musculoskeletal:        General: Normal range of motion.  Skin:    General: Skin is warm.  Neurological:     General: No focal deficit present.     Mental Status: She is alert and oriented to person, place, and time. Mental status is at baseline.  Psychiatric:        Mood and Affect: Mood normal.        Behavior: Behavior normal.        Thought Content: Thought content normal.        Judgment: Judgment normal.    Assessment/Plan:  1. Primary insomnia Stop Wellbutrin due to possible  causing symptoms of insomnia Will review overnight pulse oximetry for potential nocturnal hypoxia, not interested in sleep study at this time - Pulse oximetry, overnight; Future  2. Essential hypertension BP and HR well controlled today on current therapy, will continue with routine monitoring - amLODipine (NORVASC) 5 MG tablet; Take 1 tablet (5 mg total) by mouth daily. Take along with 2.5 mg for a total of 7.5 mg.  Dispense: 90 tablet; Refill: 3 - amLODipine (NORVASC) 2.5 MG tablet; Take 1 tablet (2.5 mg total) by mouth daily. Take along with 5 mg daily for total of 7.5 mg.  Dispense: 90 tablet; Refill: 3  3. COPD with chronic bronchitis and emphysema ([place]) Breathing stable at this time, requesting refills of nebulizer medication, will continue to monitor - ipratropium-albuterol (DUONEB) 0.5-2.5 (3) MG/3ML SOLN; INHALE THE CONTENTS OF 1 VIAL (3 ML) VIANEBULIZER EVERY 4 HOURS  Dispense: 360 mL; Refill: 3  4. Bilateral carotid artery stenosis Continue with plavix at this time, continue with surveillance, requesting refills today - clopidogrel (PLAVIX) 75 MG tablet; Take 1 tablet (75 mg total) by mouth daily.  Dispense: 90 tablet; Refill: 3  5. Nocturnal hypoxia Will review overnight pulse oximetry for possible nocturnal hypoxia causing symptoms of insomnia - Pulse oximetry, overnight; Future  6. Arthralgia, unspecified joint Requesting refills of meloxicam--advised to continue to take as needed and use sparingly - meloxicam (MOBIC) 15 MG tablet; Take 1 tablet (15 mg total) by mouth daily.  Dispense: 30 tablet; Refill: 0  General Counseling: Lyrick verbalizes understanding of the findings of todays visit and agrees with plan of treatment. I have discussed any further diagnostic evaluation that may be needed or ordered today. We also reviewed her medications today. she has been encouraged to call the office with any questions or concerns that should arise related to todays visit.    Orders  Placed This Encounter  Procedures  . Pulse oximetry, overnight    Meds ordered this encounter  Medications  . amLODipine (NORVASC) 5 MG tablet    Sig: Take 1 tablet (5 mg total) by mouth daily. Take along with 2.5 mg for a total of 7.5 mg.    Dispense:  90 tablet    Refill:  3  . amLODipine (NORVASC) 2.5 MG tablet    Sig: Take 1 tablet (2.5 mg total) by mouth daily. Take along with 5 mg daily for total of 7.5 mg.    Dispense:  90 tablet    Refill:  3  . ipratropium-albuterol (DUONEB) 0.5-2.5 (3) MG/3ML SOLN    Sig: INHALE THE CONTENTS OF 1 VIAL (3 ML) VIANEBULIZER EVERY 4 HOURS    Dispense:  360 mL    Refill:  3  . clopidogrel (PLAVIX) 75 MG tablet    Sig: Take 1 tablet (75 mg total) by mouth daily.    Dispense:  90 tablet    Refill:  3    Patient is due for a follow up appointment. Please contact the office at [phone]  . meloxicam (MOBIC) 15 MG tablet    Sig: Take 1 tablet (15 mg total) by mouth daily.    Dispense:  30 tablet    Refill:  0    Time spent: 30 Minutes Time spent includes review of chart, medications, test results and follow-up plan with the patient.  This patient was seen by Theodoro Grist AGNP-C in Collaboration with Dr Lavera Guise as a part of collaborative care  agreement     Tanna Furry. Lucyann Romano AGNP-C Internal medicine

## 2020-06-15 ENCOUNTER — Encounter: Payer: Self-pay | Admitting: Hospice and Palliative Medicine

## 2020-06-16 ENCOUNTER — Telehealth: Payer: Self-pay

## 2020-06-16 NOTE — Telephone Encounter (Signed)
Pt's medicare part D doesn't pay for her ipratropium-albuterol duoneb solution.  Pt usually pays out of pocket for the medication at her pharmacy.  I spoke to pt and for now she will continue to get from [place], she may try later to go to [facility] or [facility] to get thru regular medicare.

## 2020-06-29 ENCOUNTER — Telehealth: Payer: Self-pay

## 2020-06-29 ENCOUNTER — Other Ambulatory Visit: Payer: Self-pay | Admitting: Hospice and Palliative Medicine

## 2020-06-29 DIAGNOSIS — G4734 Idiopathic sleep related nonobstructive alveolar hypoventilation: Secondary | ICD-10-CM | POA: Diagnosis not present

## 2020-06-29 NOTE — Telephone Encounter (Signed)
Faxed over order for home use of oxygen to [facility] at [phone]. Overnight pulse oximetry placed in scan.

## 2020-07-02 DIAGNOSIS — K559 Vascular disorder of intestine, unspecified: Secondary | ICD-10-CM | POA: Diagnosis not present

## 2020-07-02 DIAGNOSIS — Z7902 Long term (current) use of antithrombotics/antiplatelets: Secondary | ICD-10-CM | POA: Diagnosis not present

## 2020-07-02 DIAGNOSIS — R195 Other fecal abnormalities: Secondary | ICD-10-CM | POA: Diagnosis not present

## 2020-07-02 DIAGNOSIS — R634 Abnormal weight loss: Secondary | ICD-10-CM | POA: Diagnosis not present

## 2020-07-02 DIAGNOSIS — J449 Chronic obstructive pulmonary disease, unspecified: Secondary | ICD-10-CM | POA: Diagnosis not present

## 2020-07-05 ENCOUNTER — Other Ambulatory Visit: Payer: Self-pay

## 2020-07-12 ENCOUNTER — Telehealth: Payer: Self-pay

## 2020-07-12 NOTE — Telephone Encounter (Signed)
Pt called asking if she can reschedule her appt.  She has a Covid test scheduled for same day as appt with [place].  Pt has been exposed to Covid and needs to be tested.  Pt also was asking when she needs to stop her Plavix before having colonoscopy and per Ladona Ridgel at least 5-7 days before procedure.  We also advised that she should speak to GI dr and see when they want her to stop Plavix.  Pt advised that GI dr wants her to get information from her PCP dr.

## 2020-07-15 ENCOUNTER — Encounter: Payer: Self-pay | Admitting: Nurse Practitioner

## 2020-07-15 ENCOUNTER — Other Ambulatory Visit
Admission: RE | Admit: 2020-07-15 | Discharge: 2020-07-15 | Disposition: A | Discharge status: Home / Self Care | Payer: Medicare Other | Source: Ambulatory Visit | Attending: Internal Medicine | Admitting: Internal Medicine

## 2020-07-15 ENCOUNTER — Ambulatory Visit (INDEPENDENT_AMBULATORY_CARE_PROVIDER_SITE_OTHER): Payer: Medicare Other | Admitting: Nurse Practitioner

## 2020-07-15 ENCOUNTER — Ambulatory Visit: Payer: Medicare Other | Admitting: Hospice and Palliative Medicine

## 2020-07-15 ENCOUNTER — Other Ambulatory Visit: Payer: Self-pay

## 2020-07-15 VITALS — BP 148/77 | HR 77 | Temp 97.9°F | Resp 16 | Ht 60.0 in | Wt 80.4 lb

## 2020-07-15 DIAGNOSIS — Z20822 Contact with and (suspected) exposure to covid-19: Secondary | ICD-10-CM | POA: Diagnosis not present

## 2020-07-15 DIAGNOSIS — Z01818 Encounter for other preprocedural examination: Secondary | ICD-10-CM | POA: Insufficient documentation

## 2020-07-15 DIAGNOSIS — Z01812 Encounter for preprocedural laboratory examination: Secondary | ICD-10-CM | POA: Insufficient documentation

## 2020-07-15 DIAGNOSIS — I10 Essential (primary) hypertension: Secondary | ICD-10-CM | POA: Diagnosis not present

## 2020-07-15 DIAGNOSIS — J449 Chronic obstructive pulmonary disease, unspecified: Secondary | ICD-10-CM | POA: Diagnosis not present

## 2020-07-15 LAB — SARS CORONAVIRUS 2 (TAT 6-24 HRS): SARS Coronavirus 2: NEGATIVE

## 2020-07-15 NOTE — Progress Notes (Signed)
[facility] [place], [place] [address]  Internal MEDICINE  Office Visit Note  Patient Name: Christina Hester  [id number]  [id number]  Date of Service: 07/15/2020  Chief Complaint  Patient presents with  . Follow-up  . Hyperlipidemia  . Hypertension    The patient is here for follow up visit. She is to have a colonoscopy on 07/19/2020. She did have a positive cologuard screening test. She is currently on Plavix. She has already stopped taking plavix on Monday 07/12/2020. She may return to taking plavix 24 hours after the colonoscopy is over.  Blood pressure is well managed. Her breathing is currently well managed. She does use oxygen at night 2liters per minute. Is using her breathing treatments a little more often since weather has been warm and humid.  She continues to have trouble with sleep. Will take lorazepam 0.5mg  (generally 1/2 tablet at night). She states that this does help her fall asleep, but will wake up around 5am and is not able to go back to sleep. She does feel refreshed when she wakes up.        Current Medication: Outpatient Encounter Medications as of 07/15/2020  Medication Sig  . albuterol (VENTOLIN HFA) 108 (90 Base) MCG/ACT inhaler INHALE 2 PUFFS INTO THE LUNGS EVERY 6 HOURS AS NEEDED FOR WHEEZING ORSHORTNESS OF BREATH  . amLODipine (NORVASC) 2.5 MG tablet Take 1 tablet (2.5 mg total) by mouth daily. Take along with 5 mg daily for total of 7.5 mg.  . amLODipine (NORVASC) 5 MG tablet Take 1 tablet (5 mg total) by mouth daily. Take along with 2.5 mg for a total of 7.5 mg.  . benzonatate (TESSALON) 200 MG capsule Take 1 capsule (200 mg total) by mouth 2 (two) times daily as needed for cough.  . clopidogrel (PLAVIX) 75 MG tablet Take 1 tablet (75 mg total) by mouth daily.  . clotrimazole (MYCELEX) 10 MG troche DISSOLVE 1 TABLET BY MOUTH 5 TIMES DAILY  . ipratropium-albuterol (DUONEB) 0.5-2.5 (3) MG/3ML SOLN INHALE THE CONTENTS OF 1 VIAL (3  ML) VIANEBULIZER EVERY 4 HOURS  . LORazepam (ATIVAN) 0.5 MG tablet Take 1 tablet (0.5 mg total) by mouth at bedtime as needed. TAKE 1/2 TO 1 TABLET BY MOUTH AT NIGHT  . meloxicam (MOBIC) 15 MG tablet Take 1 tablet (15 mg total) by mouth daily.  . nitroGLYCERIN (NITROSTAT) 0.4 MG SL tablet Place 1 tablet (0.4 mg total) under the tongue every 5 (five) minutes as needed for chest pain.  Marland Kitchen nystatin (MYCOSTATIN) 100000 UNIT/ML suspension Take 5 mLs (500,000 Units total) by mouth 4 (four) times daily.  . Tiotropium Bromide Monohydrate (SPIRIVA RESPIMAT) 2.5 MCG/ACT AERS Inhale 1 puff into the lungs daily.  Marland Kitchen buPROPion (WELLBUTRIN SR) 100 MG 12 hr tablet Take 1 tablet (100 mg total) by mouth daily. (Patient not taking: Reported on 07/15/2020)   No facility-administered encounter medications on file as of 07/15/2020.    Surgical History: Past Surgical History:  Procedure Laterality Date  . ABDOMINAL HYSTERECTOMY    . APPENDECTOMY    . BREAST EXCISIONAL BIOPSY Left 80's or 90's   NEG  . CARDIAC CATHETERIZATION    . COLONOSCOPY N/A 05/22/2018   Procedure: COLONOSCOPY;  Surgeon: Lin Landsman, MD;  Location: [facility] ENDOSCOPY;  Service: Gastroenterology;  Laterality: N/A;  . ENDARTERECTOMY Right 11/06/2019   Procedure: ENDARTERECTOMY CAROTID;  Surgeon: Algernon Huxley, MD;  Location: [facility] ORS;  Service: Vascular;  Laterality: Right;  . LEFT HEART CATH  Bilateral 03/05/2017   Procedure: Left Heart Cath with poss PCI;  Surgeon: Wellington Hampshire, MD;  Location: [place] CV LAB;  Service: Cardiovascular;  Laterality: Bilateral;    Medical History: Past Medical History:  Diagnosis Date  . AAA (abdominal aortic aneurysm) ([place])    a. 05/2016 Abd U/S: 3.02 x 3.02 AAA.  Marland Kitchen Anxiety   . CAD (coronary artery disease)    a. 02/2017 NSTEMI/Cath: LM nl, LAD mild dzs, D2 min irregs, LCX mild dzs, OM2 50ost, RCA 95p, 165m, L-->R collats, EF 55-65%.  Marland Kitchen COPD (chronic obstructive pulmonary disease) ([place])   .  Diastolic dysfunction    a. 02/2017 Echo: EF 55-60%, gr1 DD, ? inf HK, mild MR.  Marland Kitchen Hx of basal cell carcinoma    back   . 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 Hyperlipidemia   . Hypertension   . PAD (peripheral artery disease) ([place])    a. 05/2016 ABI's: R 1.22, L 1.11.  . Tobacco abuse     Family History: Family History  Problem Relation Age of Onset  . Breast cancer Mother   . Dementia Mother   . Hypertension Mother   . Breast cancer Maternal Aunt   . Breast cancer Maternal Aunt   . Breast cancer Maternal Aunt   . CVA Father     Social History   Socioeconomic History  . Marital status: Divorced    Spouse name: Not on file  . Number of children: Not on file  . Years of education: Not on file  . Highest education level: Not on file  Occupational History  . Not on file  Tobacco Use  . Smoking status: Former Smoker    Packs/day: 0.25    Quit date: 07/12/2017    Years since quitting: 3.0  . Smokeless tobacco: Never Used  . Tobacco comment: currently 90 days without smoking  Vaping Use  . Vaping Use: Never used  Substance and Sexual Activity  . Alcohol use: No  . Drug use: No  . Sexual activity: Not on file  Other Topics Concern  . Not on file  Social History Narrative  . Not on file   Social Determinants of Health   Financial Resource Strain: Not on file  Food Insecurity: Not on file  Transportation Needs: Not on file  Physical Activity: Not on file  Stress: Not on file  Social Connections: Not on file  Intimate Partner Violence: Not on file      Review of Systems  Constitutional: Negative for activity change, chills, fatigue and unexpected weight change.  HENT: Negative for congestion, postnasal drip, rhinorrhea, sneezing and sore throat.   Respiratory: Negative for cough, chest tightness, shortness of breath and wheezing.        Increased wheezing and shortness of breath and using breathing treatments more often since  weather has been warm and humid.   Cardiovascular: Negative for chest pain and palpitations.       Patient has stopped plavix 07/12/2020 due to colonoscopy scheduled for 07/19/2020  Gastrointestinal: Negative for abdominal pain, constipation, diarrhea, nausea and vomiting.       Positive cologuard. Scheduled for colonoscopy 07/19/2020  Endocrine: Negative for cold intolerance, heat intolerance, polydipsia and polyuria.  Musculoskeletal: Negative for arthralgias, back pain, joint swelling and neck pain.  Skin: Negative for rash.  Neurological: Negative for tremors and numbness.  Hematological: Negative for adenopathy. Does not bruise/bleed easily.  Psychiatric/Behavioral: Positive for sleep disturbance.  Negative for behavioral problems (Depression) and suicidal ideas. The patient is nervous/anxious.     Today's Vitals   07/15/20 0843  BP: (!) 148/77  Pulse: 77  Resp: 16  Temp: 97.9 F (36.6 C)  SpO2: 95%  Weight: 80 lb 6.4 oz (36.5 kg)  Height: 5' (1.524 m)   Body mass index is 15.7 kg/m.  Physical Exam Vitals and nursing note reviewed.  Constitutional:      General: She is not in acute distress.    Appearance: Normal appearance. She is well-developed and well-nourished. She is not diaphoretic.  HENT:     Head: Normocephalic and atraumatic.     Mouth/Throat:     Mouth: Oropharynx is clear and moist.     Pharynx: No oropharyngeal exudate.  Eyes:     Extraocular Movements: EOM normal.     Pupils: Pupils are equal, round, and reactive to light.  Neck:     Thyroid: No thyromegaly.     Vascular: No JVD.     Trachea: No tracheal deviation.  Cardiovascular:     Rate and Rhythm: Normal rate and regular rhythm.     Heart sounds: Normal heart sounds. No murmur heard. No friction rub. No gallop.   Pulmonary:     Effort: Pulmonary effort is normal. No respiratory distress.     Breath sounds: Normal breath sounds. No wheezing or rales.     Comments: Reduced breath sounds throughout  the lung fields  Chest:     Chest wall: No tenderness.  Abdominal:     General: Bowel sounds are normal.     Palpations: Abdomen is soft.     Tenderness: There is no abdominal tenderness.  Musculoskeletal:        General: Normal range of motion.     Cervical back: Normal range of motion and neck supple.  Lymphadenopathy:     Cervical: No cervical adenopathy.  Skin:    General: Skin is warm and dry.  Neurological:     General: No focal deficit present.     Mental Status: She is alert and oriented to person, place, and time.     Cranial Nerves: No cranial nerve deficit.  Psychiatric:        Mood and Affect: Mood and affect and mood normal.        Behavior: Behavior normal.        Thought Content: Thought content normal.        Judgment: Judgment normal.    Assessment/Plan: 1. Preprocedural examination Patient scheduled for colonoscopy 07/19/2020. She stopped taking plavix 07/12/2020 and will restart on 07/20/2020. She is clear for this procedure with mild risk due to COPD.   2. Essential hypertension Stable. Continue bp medication as prescribed.   3. COPD with chronic bronchitis and emphysema ([facility]) Continue inhalers and respiratory medications as prescribed. Breathing treatments as needed and as prescribed  General Counseling: Eilleen verbalizes understanding of the findings of todays visit and agrees with plan of treatment. I have discussed any further diagnostic evaluation that may be needed or ordered today. We also reviewed her medications today. she has been encouraged to call the office with any questions or concerns that should arise related to todays visit.  This patient was seen by Vincent Gros FNP Collaboration with Dr Lyndon Code as a part of collaborative care agreement   Total time spent: 30 Minutes   Time spent includes review of chart, medications, test results, and follow up plan with the patient.  Dr Lavera Guise Internal medicine

## 2020-07-19 ENCOUNTER — Encounter
Admission: RE | Disposition: A | Discharge status: Home / Self Care | Payer: Self-pay | Source: Home / Self Care | Attending: Internal Medicine

## 2020-07-19 ENCOUNTER — Other Ambulatory Visit: Payer: Self-pay

## 2020-07-19 ENCOUNTER — Ambulatory Visit: Payer: Medicare Other | Admitting: Certified Registered"

## 2020-07-19 ENCOUNTER — Encounter: Payer: Self-pay | Admitting: Internal Medicine

## 2020-07-19 ENCOUNTER — Telehealth: Payer: Self-pay

## 2020-07-19 ENCOUNTER — Ambulatory Visit
Admission: RE | Admit: 2020-07-19 | Discharge: 2020-07-19 | Disposition: A | Discharge status: Home / Self Care | Payer: Medicare Other | Attending: Internal Medicine | Admitting: Internal Medicine

## 2020-07-19 DIAGNOSIS — Z79899 Other long term (current) drug therapy: Secondary | ICD-10-CM | POA: Insufficient documentation

## 2020-07-19 DIAGNOSIS — I110 Hypertensive heart disease with heart failure: Secondary | ICD-10-CM | POA: Diagnosis not present

## 2020-07-19 DIAGNOSIS — I252 Old myocardial infarction: Secondary | ICD-10-CM | POA: Diagnosis not present

## 2020-07-19 DIAGNOSIS — I5030 Unspecified diastolic (congestive) heart failure: Secondary | ICD-10-CM | POA: Diagnosis not present

## 2020-07-19 DIAGNOSIS — Z791 Long term (current) use of non-steroidal anti-inflammatories (NSAID): Secondary | ICD-10-CM | POA: Insufficient documentation

## 2020-07-19 DIAGNOSIS — J449 Chronic obstructive pulmonary disease, unspecified: Secondary | ICD-10-CM | POA: Insufficient documentation

## 2020-07-19 DIAGNOSIS — R195 Other fecal abnormalities: Secondary | ICD-10-CM | POA: Diagnosis not present

## 2020-07-19 DIAGNOSIS — K5730 Diverticulosis of large intestine without perforation or abscess without bleeding: Secondary | ICD-10-CM | POA: Diagnosis not present

## 2020-07-19 DIAGNOSIS — K640 First degree hemorrhoids: Secondary | ICD-10-CM | POA: Insufficient documentation

## 2020-07-19 DIAGNOSIS — I714 Abdominal aortic aneurysm, without rupture: Secondary | ICD-10-CM | POA: Diagnosis not present

## 2020-07-19 DIAGNOSIS — I739 Peripheral vascular disease, unspecified: Secondary | ICD-10-CM | POA: Insufficient documentation

## 2020-07-19 DIAGNOSIS — I2510 Atherosclerotic heart disease of native coronary artery without angina pectoris: Secondary | ICD-10-CM | POA: Diagnosis not present

## 2020-07-19 DIAGNOSIS — Z888 Allergy status to other drugs, medicaments and biological substances status: Secondary | ICD-10-CM | POA: Insufficient documentation

## 2020-07-19 DIAGNOSIS — Z882 Allergy status to sulfonamides status: Secondary | ICD-10-CM | POA: Diagnosis not present

## 2020-07-19 DIAGNOSIS — Z886 Allergy status to analgesic agent status: Secondary | ICD-10-CM | POA: Insufficient documentation

## 2020-07-19 HISTORY — PX: COLONOSCOPY WITH PROPOFOL: SHX5780

## 2020-07-19 SURGERY — COLONOSCOPY WITH PROPOFOL
Anesthesia: General

## 2020-07-19 MED ORDER — LIDOCAINE HCL (CARDIAC) PF 100 MG/5ML IV SOSY
PREFILLED_SYRINGE | INTRAVENOUS | Status: DC | PRN
  Administered 2020-07-19: 60 mg via INTRAVENOUS

## 2020-07-19 MED ORDER — SODIUM CHLORIDE 0.9 % IV SOLN: INTRAVENOUS | Status: DC

## 2020-07-19 MED ORDER — PROPOFOL 500 MG/50ML IV EMUL
INTRAVENOUS | Status: DC | PRN
  Administered 2020-07-19: 145 ug/kg/min via INTRAVENOUS

## 2020-07-19 MED ORDER — PROPOFOL 10 MG/ML IV BOLUS
INTRAVENOUS | Status: DC | PRN
Start: 2020-07-19 — End: 2020-07-19
  Administered 2020-07-19: 10 mg via INTRAVENOUS
  Administered 2020-07-19: 60 mg via INTRAVENOUS

## 2020-07-19 MED ORDER — GLYCOPYRROLATE 0.2 MG/ML IJ SOLN
INTRAMUSCULAR | Status: DC | PRN
  Administered 2020-07-19: .2 mg via INTRAVENOUS

## 2020-07-19 NOTE — Anesthesia Preprocedure Evaluation (Signed)
Anesthesia Evaluation  Patient identified by MRN, date of birth, ID band Patient awake    Reviewed: Allergy & Precautions, H&P , NPO status , Patient's Chart, lab work & pertinent test results, reviewed documented beta blocker date and time   History of Anesthesia Complications Negative for: history of anesthetic complications  Airway Mallampati: II  TM Distance: >3 FB Neck ROM: limited    Dental  (+) Upper Dentures, Partial Lower, Chipped, Missing, Caps   Pulmonary neg sleep apnea, neg pneumonia , COPD,  COPD inhaler, Patient abstained from smoking.Not current smoker, former smoker,  Takes nebulizers every four hours.  Inhaler taken in front of me during this preop eval per my request.    + decreased breath sounds      Cardiovascular Exercise Tolerance: Good METShypertension, + angina + CAD and + Peripheral Vascular Disease  (-) Past MI Normal cardiovascular exam(-) dysrhythmias  Rhythm:Regular Rate:Normal - Systolic murmurs 1093 ECHO Study Conclusions   - Left ventricle: The cavity size was normal. There was mild  concentric hypertrophy. Systolic function was normal. The  estimated ejection fraction was in the range of 55% to 60%.  Possible mild hypokinesis of the inferior myocardium. Doppler  parameters are consistent with abnormal left ventricular  relaxation (grade 1 diastolic dysfunction).  - Mitral valve: There was mild regurgitation  Cardiac clearance: She is deemed appropriate cardiovascular risk for the planned procedure and may proceed without additional cardiovascular testing.   Neuro/Psych PSYCHIATRIC DISORDERS Anxiety negative neurological ROS     GI/Hepatic neg GERD  ,(+)     (-) substance abuse  ,   Endo/Other  neg diabetes  Renal/GU negative Renal ROS     Musculoskeletal   Abdominal   Peds  Hematology   Anesthesia Other Findings Past Medical History: No date: AAA (abdominal aortic  aneurysm) (HCC)     Comment:  a. 05/2016 Abd U/S: 3.02 x 3.02 AAA. No date: Anxiety No date: CAD (coronary artery disease)     Comment:  a. 02/2017 NSTEMI/Cath: LM nl, LAD mild dzs, D2 min               irregs, LCX mild dzs, OM2 50ost, RCA 95p, 160m, L-->R               collats, EF 55-65%. No date: COPD (chronic obstructive pulmonary disease) (HCC) No date: Diastolic dysfunction     Comment:  a. 02/2017 Echo: EF 55-60%, gr1 DD, ? inf HK, mild MR. No date: Hx of basal cell carcinoma     Comment:  back  12/02/2018: Hx of squamous cell carcinoma     Comment:  Right lateral breast  No date: Hyperlipidemia No date: Hypertension No date: PAD (peripheral artery disease) (HCC)     Comment:  a. 05/2016 ABI's: R 1.22, L 1.11. No date: Tobacco abuse Past Surgical History: No date: ABDOMINAL HYSTERECTOMY No date: APPENDECTOMY 80's or 90's: BREAST EXCISIONAL BIOPSY; Left     Comment:  NEG No date: CARDIAC CATHETERIZATION 05/22/2018: COLONOSCOPY; N/A     Comment:  Procedure: COLONOSCOPY;  Surgeon: Toney Reil,               MD;  Location: [facility] ENDOSCOPY;  Service:               Gastroenterology;  Laterality: N/A; 03/05/2017: LEFT HEART CATH; Bilateral     Comment:  Procedure: Left Heart Cath with poss PCI;  Surgeon:  Wellington Hampshire, MD;  Location: [facility];                Service: Cardiovascular;  Laterality: Bilateral;   Reproductive/Obstetrics                             Anesthesia Physical  Anesthesia Plan  ASA: III  Anesthesia Plan: General   Post-op Pain Management:    Induction: Intravenous  PONV Risk Score and Plan: 3 and Ondansetron and Treatment may vary due to age or medical condition  Airway Management Planned: Oral ETT  Additional Equipment: None  Intra-op Plan:   Post-operative Plan: Extubation in OR  Informed Consent: I have reviewed the patients History and Physical, chart, labs and discussed the  procedure including the risks, benefits and alternatives for the proposed anesthesia with the patient or authorized representative who has indicated his/her understanding and acceptance.     Dental Advisory Given  Plan Discussed with: CRNA  Anesthesia Plan Comments: (Discussed risks of anesthesia with patient, including possibility of difficulty with spontaneous ventilation under anesthesia necessitating airway intervention, PONV, and rare risks such as cardiac or respiratory or neurological events. Patient understands.)        Anesthesia Quick Evaluation

## 2020-07-19 NOTE — Interval H&P Note (Signed)
History and Physical Interval Note:  07/19/2020 9:24 AM  Christina Hester  has presented today for surgery, with the diagnosis of + COLOGUARD.  The various methods of treatment have been discussed with the patient and family. After consideration of risks, benefits and other options for treatment, the patient has consented to  Procedure(s): COLONOSCOPY WITH PROPOFOL (N/A) as a surgical intervention.  The patient's history has been reviewed, patient examined, no change in status, stable for surgery.  I have reviewed the patient's chart and labs.  Questions were answered to the patient's satisfaction.     Knierim, Abiquiu

## 2020-07-19 NOTE — H&P (Signed)
Outpatient short stay form Pre-procedure 07/19/2020 9:23 AM Christina Hester Christina Hester, M.D.  Primary Physician: Clayborn Bigness, M.D>  Reason for visit:  Positive cologuard test  History of present illness:  Patient is a pleasant 80 y/o female/female presenting on referral from primary care provider for a POSITIVE Cologuard result. Patient denies change in bowel habits, rectal bleeding, weight loss or abdominal pain.     No current facility-administered medications for this encounter.  Medications Prior to Admission  Medication Sig Dispense Refill Last Dose  . albuterol (VENTOLIN HFA) 108 (90 Base) MCG/ACT inhaler INHALE 2 PUFFS INTO THE LUNGS EVERY 6 HOURS AS NEEDED FOR WHEEZING ORSHORTNESS OF BREATH 18 g 3   . amLODipine (NORVASC) 2.5 MG tablet Take 1 tablet (2.5 mg total) by mouth daily. Take along with 5 mg daily for total of 7.5 mg. 90 tablet 3   . amLODipine (NORVASC) 5 MG tablet Take 1 tablet (5 mg total) by mouth daily. Take along with 2.5 mg for a total of 7.5 mg. 90 tablet 3   . benzonatate (TESSALON) 200 MG capsule Take 1 capsule (200 mg total) by mouth 2 (two) times daily as needed for cough. 45 capsule 1   . buPROPion (WELLBUTRIN SR) 100 MG 12 hr tablet Take 1 tablet (100 mg total) by mouth daily. (Patient not taking: Reported on 07/15/2020) 90 tablet 3   . clopidogrel (PLAVIX) 75 MG tablet Take 1 tablet (75 mg total) by mouth daily. 90 tablet 3   . clotrimazole (MYCELEX) 10 MG troche DISSOLVE 1 TABLET BY MOUTH 5 TIMES DAILY 70 tablet 0   . ipratropium-albuterol (DUONEB) 0.5-2.5 (3) MG/3ML SOLN INHALE THE CONTENTS OF 1 VIAL (3 ML) VIANEBULIZER EVERY 4 HOURS 360 mL 3   . LORazepam (ATIVAN) 0.5 MG tablet Take 1 tablet (0.5 mg total) by mouth at bedtime as needed. TAKE 1/2 TO 1 TABLET BY MOUTH AT NIGHT 30 tablet 3   . meloxicam (MOBIC) 15 MG tablet Take 1 tablet (15 mg total) by mouth daily. 30 tablet 0   . nitroGLYCERIN (NITROSTAT) 0.4 MG SL tablet Place 1 tablet (0.4 mg total) under the tongue  every 5 (five) minutes as needed for chest pain. 20 tablet 12   . nystatin (MYCOSTATIN) 100000 UNIT/ML suspension Take 5 mLs (500,000 Units total) by mouth 4 (four) times daily. 200 mL 1   . Tiotropium Bromide Monohydrate (SPIRIVA RESPIMAT) 2.5 MCG/ACT AERS Inhale 1 puff into the lungs daily. 30 Inhaler 5      Allergies  Allergen Reactions  . Aspirin Tinitus  . Prednisone Other (See Comments)    Makes violent  . Sulfa Antibiotics Nausea And Vomiting  . Symbicort [Budesonide-Formoterol Fumarate] Other (See Comments)    Patient felt "out of her mind" and angry.     Past Medical History:  Diagnosis Date  . AAA (abdominal aortic aneurysm) ([place])    a. 05/2016 Abd U/S: 3.02 x 3.02 AAA.  Marland Kitchen Anxiety   . CAD (coronary artery disease)    a. 02/2017 NSTEMI/Cath: LM nl, LAD mild dzs, D2 min irregs, LCX mild dzs, OM2 50ost, RCA 95p, 148m, L-->R collats, EF 55-65%.  Marland Kitchen COPD (chronic obstructive pulmonary disease) ([place])   . Diastolic dysfunction    a. 02/2017 Echo: EF 55-60%, gr1 DD, ? inf HK, mild MR.  Marland Kitchen Hx of basal cell carcinoma    back   . 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 Hyperlipidemia   . Hypertension   .  PAD (peripheral artery disease) (HCC)    a. 05/2016 ABI's: R 1.22, L 1.11.  . Tobacco abuse     Review of systems:  Otherwise negative.    Physical Exam  Gen: Alert, oriented. Appears stated age.  HEENT: [place]/AT. PERRLA. Lungs: CTA, no wheezes. CV: RR nl S1, S2. Abd: soft, benign, no masses. BS+ Ext: No edema. Pulses 2+    Planned procedures: Proceed with colonoscopy. The patient understands the nature of the planned procedure, indications, risks, alternatives and potential complications including but not limited to bleeding, infection, perforation, damage to internal organs and possible oversedation/side effects from anesthesia. The patient agrees and gives consent to proceed.  Please refer to procedure notes for findings, recommendations and  patient disposition/instructions.     Christina Hester K. Norma Fredrickson, M.D. Gastroenterology 07/19/2020  9:23 AM

## 2020-07-19 NOTE — Anesthesia Procedure Notes (Signed)
Procedure Name: General with mask airway Performed by: Fletcher-Harrison, Frederich Montilla, CRNA Pre-anesthesia Checklist: Patient identified, Emergency Drugs available, Suction available and Patient being monitored Patient Re-evaluated:Patient Re-evaluated prior to induction Oxygen Delivery Method: Simple face mask Induction Type: IV induction Placement Confirmation: positive ETCO2 and CO2 detector Dental Injury: Teeth and Oropharynx as per pre-operative assessment        

## 2020-07-19 NOTE — Telephone Encounter (Signed)
Risk assessment and cardiac clearance form filled out and signed by provider and faxed back to [facility] on 07/15/20 at [phone]. Copy placed in scan. Toni Amend

## 2020-07-19 NOTE — Transfer of Care (Signed)
Immediate Anesthesia Transfer of Care Note  Patient: Christina Hester  Procedure(s) Performed: COLONOSCOPY WITH PROPOFOL (N/A )  Patient Location: PACU  Anesthesia Type:General  Level of Consciousness: drowsy and patient cooperative  Airway & Oxygen Therapy: Patient Spontanous Breathing and Patient connected to face mask oxygen  Post-op Assessment: Report given to RN and Post -op Vital signs reviewed and stable  Post vital signs: Reviewed and stable  Last Vitals:  Vitals Value Taken Time  BP    Temp    Pulse 76 07/19/20 1116  Resp 17 07/19/20 1116  SpO2 100 % 07/19/20 1116  Vitals shown include unvalidated device data.  Last Pain:  Vitals:   07/19/20 1110  TempSrc: Temporal  PainSc:          Complications: No complications documented.

## 2020-07-19 NOTE — Anesthesia Postprocedure Evaluation (Signed)
Anesthesia Post Note  Patient: Christina Hester  Procedure(s) Performed: COLONOSCOPY WITH PROPOFOL (N/A )  Patient location during evaluation: Endoscopy Anesthesia Type: General Level of consciousness: awake and alert Pain management: pain level controlled Vital Signs Assessment: post-procedure vital signs reviewed and stable Respiratory status: spontaneous breathing, nonlabored ventilation, respiratory function stable and patient connected to nasal cannula oxygen Cardiovascular status: blood pressure returned to baseline and stable Postop Assessment: no apparent nausea or vomiting Anesthetic complications: no   No complications documented.   Last Vitals:  Vitals:   07/19/20 1120 07/19/20 1130  BP: 128/84 129/83  Pulse: 77 81  Resp: (!) 25 20  Temp:    SpO2: 100% 100%    Last Pain:  Vitals:   07/19/20 1110  TempSrc: Temporal  PainSc:                  Corinda Gubler

## 2020-07-19 NOTE — Op Note (Signed)
[facility] Gastroenterology Patient Name: Christina Hester Procedure Date: 07/19/2020 10:43 AM MRN: [id number] Account #: [id number] Date of Birth: July 19, 1940 Admit Type: Outpatient Age: 80 Room: [facility] ENDO ROOM 2 Gender: Female Note Status: Finalized Procedure:             Colonoscopy Indications:           Positive Cologuard test Providers:             Boykin Nearing. Norma Fredrickson MD, MD Referring MD:          Lyndon Code, MD (Referring MD) Medicines:             Propofol per Anesthesia Complications:         No immediate complications. Procedure:             Pre-Anesthesia Assessment:                        - The risks and benefits of the procedure and the                         sedation options and risks were discussed with the                         patient. All questions were answered and informed                         consent was obtained.                        - Patient identification and proposed procedure were                         verified prior to the procedure by the nurse. The                         procedure was verified in the procedure room.                        - ASA Grade Assessment: III - A patient with severe                         systemic disease.                        - After reviewing the risks and benefits, the patient                         was deemed in satisfactory condition to undergo the                         procedure.                        After obtaining informed consent, the colonoscope was                         passed under direct vision. Throughout the procedure,                         the patient's blood pressure, pulse, and  oxygen                         saturations were monitored continuously. The                         Colonoscope was introduced through the anus and                         advanced to the the cecum, identified by appendiceal                         orifice and ileocecal valve. The colonoscopy was                          technically difficult and complex due to restricted                         mobility of the colon. Successful completion of the                         procedure was aided by applying abdominal pressure.                         The patient tolerated the procedure well. The quality                         of the bowel preparation was excellent. The ileocecal                         valve, appendiceal orifice, and rectum were                         photographed. Findings:      The perianal and digital rectal examinations were normal. Pertinent       negatives include normal sphincter tone and no palpable rectal lesions.      Non-bleeding internal hemorrhoids were found during retroflexion. The       hemorrhoids were Grade I (internal hemorrhoids that do not prolapse).      Many small-mouthed diverticula were found in the sigmoid colon.      The exam was otherwise without abnormality. Impression:            - Non-bleeding internal hemorrhoids.                        - Diverticulosis in the sigmoid colon.                        - The examination was otherwise normal.                        - No specimens collected. Recommendation:        - Patient has a contact number available for                         emergencies. The signs and symptoms of potential                         delayed complications were discussed with  the patient.                         Return to normal activities tomorrow. Written                         discharge instructions were provided to the patient.                        - Resume previous diet.                        - Continue present medications.                        - You do NOT require further colon cancer screening                         measures (Annual stool testing (i.e. hemoccult, FIT,                         cologuard), sigmoidoscopy, colonoscopy or CT                         colonography). You should share this recommendation                          with your Primary Care provider.                        - Return to GI office PRN.                        - The findings and recommendations were discussed with                         the patient. Procedure Code(s):     --- Professional ---                        [phone], Colonoscopy, flexible; diagnostic, including                         collection of specimen(s) by brushing or washing, when                         performed (separate procedure) Diagnosis Code(s):     --- Professional ---                        K57.30, Diverticulosis of large intestine without                         perforation or abscess without bleeding                        R19.5, Other fecal abnormalities                        K64.0, First degree hemorrhoids CPT copyright 2019 American [employer]. All rights reserved. The codes documented in this report are preliminary and upon coder review may  be revised to meet current  compliance requirements. Efrain Sella MD, MD 07/19/2020 11:12:09 AM This report has been signed electronically. Number of Addenda: 0 Note Initiated On: 07/19/2020 10:43 AM Scope Withdrawal Time: 0 hours 8 minutes 48 seconds  Total Procedure Duration: 0 hours 18 minutes 57 seconds  Estimated Blood Loss:  Estimated blood loss: none.      [facility]

## 2020-07-21 ENCOUNTER — Encounter: Payer: Self-pay | Admitting: Internal Medicine

## 2020-07-22 ENCOUNTER — Telehealth: Payer: Self-pay

## 2020-07-22 NOTE — Telephone Encounter (Signed)
Spoke with [employer] and pt qualifies for NIV, she has appt coming up with taylor to discuss and have correct documentation for it. Christina Hester

## 2020-08-03 ENCOUNTER — Other Ambulatory Visit: Payer: Self-pay | Admitting: Hospice and Palliative Medicine

## 2020-08-03 DIAGNOSIS — Z1231 Encounter for screening mammogram for malignant neoplasm of breast: Secondary | ICD-10-CM

## 2020-08-09 ENCOUNTER — Ambulatory Visit: Payer: Medicare Other | Admitting: Hospice and Palliative Medicine

## 2020-08-12 ENCOUNTER — Encounter: Payer: Self-pay | Admitting: Hospice and Palliative Medicine

## 2020-08-12 ENCOUNTER — Ambulatory Visit (INDEPENDENT_AMBULATORY_CARE_PROVIDER_SITE_OTHER): Payer: Medicare Other | Admitting: Hospice and Palliative Medicine

## 2020-08-12 VITALS — BP 122/74 | HR 92 | Temp 97.6°F | Resp 16 | Ht 60.0 in | Wt 79.0 lb

## 2020-08-12 DIAGNOSIS — F411 Generalized anxiety disorder: Secondary | ICD-10-CM

## 2020-08-12 DIAGNOSIS — I6523 Occlusion and stenosis of bilateral carotid arteries: Secondary | ICD-10-CM | POA: Diagnosis not present

## 2020-08-12 DIAGNOSIS — J449 Chronic obstructive pulmonary disease, unspecified: Secondary | ICD-10-CM

## 2020-08-12 DIAGNOSIS — G4734 Idiopathic sleep related nonobstructive alveolar hypoventilation: Secondary | ICD-10-CM | POA: Diagnosis not present

## 2020-08-12 NOTE — Progress Notes (Signed)
[facility] [place], [place] [address]  Internal MEDICINE  Office Visit Note  Patient Name: Christina Hester  [id number]  [id number]  Date of Service: 08/14/2020  Chief Complaint  Patient presents with  . Follow-up  . Hyperlipidemia  . Hypertension    HPI Patient is here for routine follow-up Oxygen was delivered for nocturnal use--she is still adjusting to using it, continues to sleep well, some nights forgets to wear oxygen but has started setting alarm to help her remember Had colonoscopy due to positive ColoGuard screening--colonoscopy normal, GI provider explained for her to no longer have ColoGuard testing and will likely not need a repeat colonoscopy She continues to struggle with her weight--noted 2 pound weight loss since our last visit, she continues to work on increasing her intake and attempts to drink protein shakes daily Breathing remains stable, continues to have shortness of breath with exertion  Current Medication: Outpatient Encounter Medications as of 08/12/2020  Medication Sig  . albuterol (VENTOLIN HFA) 108 (90 Base) MCG/ACT inhaler INHALE 2 PUFFS INTO THE LUNGS EVERY 6 HOURS AS NEEDED FOR WHEEZING ORSHORTNESS OF BREATH  . amLODipine (NORVASC) 2.5 MG tablet Take 1 tablet (2.5 mg total) by mouth daily. Take along with 5 mg daily for total of 7.5 mg.  . amLODipine (NORVASC) 5 MG tablet Take 1 tablet (5 mg total) by mouth daily. Take along with 2.5 mg for a total of 7.5 mg.  . benzonatate (TESSALON) 200 MG capsule Take 1 capsule (200 mg total) by mouth 2 (two) times daily as needed for cough.  . clopidogrel (PLAVIX) 75 MG tablet Take 1 tablet (75 mg total) by mouth daily.  . clotrimazole (MYCELEX) 10 MG troche DISSOLVE 1 TABLET BY MOUTH 5 TIMES DAILY  . ipratropium-albuterol (DUONEB) 0.5-2.5 (3) MG/3ML SOLN INHALE THE CONTENTS OF 1 VIAL (3 ML) VIANEBULIZER EVERY 4 HOURS  . LORazepam (ATIVAN) 0.5 MG tablet Take 1 tablet (0.5 mg total) by  mouth at bedtime as needed. TAKE 1/2 TO 1 TABLET BY MOUTH AT NIGHT  . meloxicam (MOBIC) 15 MG tablet Take 1 tablet (15 mg total) by mouth daily.  . nitroGLYCERIN (NITROSTAT) 0.4 MG SL tablet Place 1 tablet (0.4 mg total) under the tongue every 5 (five) minutes as needed for chest pain.  Marland Kitchen nystatin (MYCOSTATIN) 100000 UNIT/ML suspension Take 5 mLs (500,000 Units total) by mouth 4 (four) times daily.  . Tiotropium Bromide Monohydrate (SPIRIVA RESPIMAT) 2.5 MCG/ACT AERS Inhale 1 puff into the lungs daily.  . [DISCONTINUED] buPROPion (WELLBUTRIN SR) 100 MG 12 hr tablet Take 1 tablet (100 mg total) by mouth daily. (Patient not taking: Reported on 07/15/2020)  . [DISCONTINUED] LORazepam (ATIVAN) 0.5 MG tablet Take 1 tablet (0.5 mg total) by mouth at bedtime as needed. TAKE 1/2 TO 1 TABLET BY MOUTH AT NIGHT   No facility-administered encounter medications on file as of 08/12/2020.    Surgical History: Past Surgical History:  Procedure Laterality Date  . ABDOMINAL HYSTERECTOMY    . APPENDECTOMY    . BREAST EXCISIONAL BIOPSY Left 80's or 90's   NEG  . CARDIAC CATHETERIZATION    . COLONOSCOPY N/A 05/22/2018   Procedure: COLONOSCOPY;  Surgeon: Lin Landsman, MD;  Location: [facility] ENDOSCOPY;  Service: Gastroenterology;  Laterality: N/A;  . COLONOSCOPY WITH PROPOFOL N/A 07/19/2020   Procedure: COLONOSCOPY WITH PROPOFOL;  Surgeon: Toledo, Benay Pike, MD;  Location: [facility] ENDOSCOPY;  Service: Gastroenterology;  Laterality: N/A;  . ENDARTERECTOMY Right 11/06/2019   Procedure: ENDARTERECTOMY  CAROTID;  Surgeon: Algernon Huxley, MD;  Location: [facility] ORS;  Service: Vascular;  Laterality: Right;  . LEFT HEART CATH Bilateral 03/05/2017   Procedure: Left Heart Cath with poss PCI;  Surgeon: Wellington Hampshire, MD;  Location: [place] CV LAB;  Service: Cardiovascular;  Laterality: Bilateral;    Medical History: Past Medical History:  Diagnosis Date  . AAA (abdominal aortic aneurysm) ([place])    a. 05/2016 Abd U/S:  3.02 x 3.02 AAA.  Marland Kitchen Anxiety   . CAD (coronary artery disease)    a. 02/2017 NSTEMI/Cath: LM nl, LAD mild dzs, D2 min irregs, LCX mild dzs, OM2 50ost, RCA 95p, 185m, L-->R collats, EF 55-65%.  Marland Kitchen COPD (chronic obstructive pulmonary disease) ([place])   . Diastolic dysfunction    a. 02/2017 Echo: EF 55-60%, gr1 DD, ? inf HK, mild MR.  Marland Kitchen Hx of basal cell carcinoma    back   . 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 Hyperlipidemia   . Hypertension   . PAD (peripheral artery disease) ([place])    a. 05/2016 ABI's: R 1.22, L 1.11.  . Tobacco abuse     Family History: Family History  Problem Relation Age of Onset  . Breast cancer Mother   . Dementia Mother   . Hypertension Mother   . Breast cancer Maternal Aunt   . Breast cancer Maternal Aunt   . Breast cancer Maternal Aunt   . CVA Father     Social History   Socioeconomic History  . Marital status: Divorced    Spouse name: Not on file  . Number of children: Not on file  . Years of education: Not on file  . Highest education level: Not on file  Occupational History  . Not on file  Tobacco Use  . Smoking status: Former Smoker    Packs/day: 0.25    Quit date: 07/12/2017    Years since quitting: 3.0  . Smokeless tobacco: Never Used  . Tobacco comment: currently 90 days without smoking  Vaping Use  . Vaping Use: Never used  Substance and Sexual Activity  . Alcohol use: No  . Drug use: No  . Sexual activity: Not on file  Other Topics Concern  . Not on file  Social History Narrative  . Not on file   Social Determinants of Health   Financial Resource Strain: Not on file  Food Insecurity: Not on file  Transportation Needs: Not on file  Physical Activity: Not on file  Stress: Not on file  Social Connections: Not on file  Intimate Partner Violence: Not on file      Review of Systems  Constitutional: Positive for unexpected weight change. Negative for chills, diaphoresis and fatigue.  HENT:  Negative for ear pain, postnasal drip and sinus pressure.   Eyes: Negative for photophobia, discharge, redness, itching and visual disturbance.  Respiratory: Positive for shortness of breath. Negative for cough and wheezing.   Cardiovascular: Negative for chest pain, palpitations and leg swelling.  Gastrointestinal: Negative for abdominal pain, constipation, diarrhea, nausea and vomiting.  Genitourinary: Negative for dysuria and flank pain.  Musculoskeletal: Negative for arthralgias, back pain, gait problem and neck pain.  Skin: Negative for color change.  Allergic/Immunologic: Negative for environmental allergies and food allergies.  Neurological: Negative for dizziness and headaches.  Hematological: Does not bruise/bleed easily.  Psychiatric/Behavioral: Negative for agitation, behavioral problems (depression) and hallucinations.    Vital Signs: BP 122/74   Pulse 92  Temp 97.6 F (36.4 C)   Resp 16   Ht 5' (1.524 m)   Wt 79 lb (35.8 kg)   SpO2 98%   BMI 15.43 kg/m    Physical Exam Vitals reviewed.  Constitutional:      Appearance: Normal appearance.  Cardiovascular:     Rate and Rhythm: Normal rate and regular rhythm.     Pulses: Normal pulses.     Heart sounds: Normal heart sounds.  Pulmonary:     Effort: Pulmonary effort is normal.     Breath sounds: Normal breath sounds.  Abdominal:     General: Abdomen is flat.     Palpations: Abdomen is soft.  Musculoskeletal:        General: Normal range of motion.     Cervical back: Normal range of motion.  Skin:    General: Skin is warm.  Neurological:     General: No focal deficit present.     Mental Status: She is alert and oriented to person, place, and time. Mental status is at baseline.  Psychiatric:        Mood and Affect: Mood normal.        Behavior: Behavior normal.        Thought Content: Thought content normal.        Judgment: Judgment normal.    Assessment/Plan: 1. Chronic obstructive pulmonary disease,  unspecified COPD type ([place]) Breathing remains stable, obtain updated CXR as well as PFT - DG Chest 2 View; Future - Pulmonary function test; Future  2. Generalized anxiety disorder Continue to use lorazepam as needed, rarely feels the need to use, afraid of becoming dependent - LORazepam (ATIVAN) 0.5 MG tablet; Take 1 tablet (0.5 mg total) by mouth at bedtime as needed. TAKE 1/2 TO 1 TABLET BY MOUTH AT NIGHT  Dispense: 30 tablet; Refill: 1  3. Nocturnal hypoxia Encouraged nightly use of supplemental oxygen therapy  4. Bilateral carotid artery stenosis Followed and managed by vascular surgery  General Counseling: Ebonee verbalizes understanding of the findings of todays visit and agrees with plan of treatment. I have discussed any further diagnostic evaluation that may be needed or ordered today. We also reviewed her medications today. she has been encouraged to call the office with any questions or concerns that should arise related to todays visit.    Orders Placed This Encounter  Procedures  . DG Chest 2 View  . Pulmonary function test    Meds ordered this encounter  Medications  . LORazepam (ATIVAN) 0.5 MG tablet    Sig: Take 1 tablet (0.5 mg total) by mouth at bedtime as needed. TAKE 1/2 TO 1 TABLET BY MOUTH AT NIGHT    Dispense:  30 tablet    Refill:  1    Time spent: 30 Minutes Time spent includes review of chart, medications, test results and follow-up plan with the patient.  This patient was seen by Theodoro Grist AGNP-C in Collaboration with Dr Lavera Guise as a part of collaborative care agreement     Tanna Furry. Janiylah Hannis AGNP-C Internal medicine

## 2020-08-13 MED ORDER — LORAZEPAM 0.5 MG PO TABS: 0.5000 mg | ORAL_TABLET | Freq: Every evening | ORAL | 1 refills | Status: DC | PRN

## 2020-08-14 ENCOUNTER — Encounter: Payer: Self-pay | Admitting: Hospice and Palliative Medicine

## 2020-08-16 ENCOUNTER — Other Ambulatory Visit: Payer: Self-pay

## 2020-08-16 ENCOUNTER — Ambulatory Visit
Admission: RE | Admit: 2020-08-16 | Discharge: 2020-08-16 | Disposition: A | Discharge status: Home / Self Care | Payer: Medicare Other | Attending: Hospice and Palliative Medicine | Admitting: Hospice and Palliative Medicine

## 2020-08-16 ENCOUNTER — Ambulatory Visit
Admission: RE | Admit: 2020-08-16 | Discharge: 2020-08-16 | Disposition: A | Discharge status: Home / Self Care | Payer: Medicare Other | Source: Ambulatory Visit | Attending: Hospice and Palliative Medicine | Admitting: Hospice and Palliative Medicine

## 2020-08-16 DIAGNOSIS — J449 Chronic obstructive pulmonary disease, unspecified: Secondary | ICD-10-CM | POA: Diagnosis not present

## 2020-08-16 DIAGNOSIS — R059 Cough, unspecified: Secondary | ICD-10-CM | POA: Diagnosis not present

## 2020-08-16 DIAGNOSIS — R079 Chest pain, unspecified: Secondary | ICD-10-CM | POA: Diagnosis not present

## 2020-08-20 ENCOUNTER — Telehealth: Payer: Self-pay

## 2020-08-20 NOTE — Telephone Encounter (Signed)
-----   Message from Luiz Ochoa, NP sent at 08/20/2020  3:01 PM EST ----- Call and explain CXR is normal.

## 2020-08-20 NOTE — Telephone Encounter (Signed)
PT notified

## 2020-08-20 NOTE — Progress Notes (Signed)
Call and explain CXR is normal.

## 2020-09-01 ENCOUNTER — Other Ambulatory Visit (INDEPENDENT_AMBULATORY_CARE_PROVIDER_SITE_OTHER): Payer: Self-pay | Admitting: Nurse Practitioner

## 2020-09-01 DIAGNOSIS — I714 Abdominal aortic aneurysm, without rupture, unspecified: Secondary | ICD-10-CM

## 2020-09-01 DIAGNOSIS — I739 Peripheral vascular disease, unspecified: Secondary | ICD-10-CM

## 2020-09-03 ENCOUNTER — Other Ambulatory Visit: Payer: Self-pay

## 2020-09-03 ENCOUNTER — Ambulatory Visit (INDEPENDENT_AMBULATORY_CARE_PROVIDER_SITE_OTHER): Payer: Medicare Other

## 2020-09-03 ENCOUNTER — Encounter (INDEPENDENT_AMBULATORY_CARE_PROVIDER_SITE_OTHER): Payer: Self-pay | Admitting: Nurse Practitioner

## 2020-09-03 ENCOUNTER — Ambulatory Visit (INDEPENDENT_AMBULATORY_CARE_PROVIDER_SITE_OTHER): Payer: Medicare Other | Admitting: Nurse Practitioner

## 2020-09-03 VITALS — BP 127/71 | HR 93 | Resp 15 | Wt 76.0 lb

## 2020-09-03 DIAGNOSIS — I714 Abdominal aortic aneurysm, without rupture, unspecified: Secondary | ICD-10-CM

## 2020-09-03 DIAGNOSIS — I10 Essential (primary) hypertension: Secondary | ICD-10-CM | POA: Diagnosis not present

## 2020-09-03 DIAGNOSIS — I6523 Occlusion and stenosis of bilateral carotid arteries: Secondary | ICD-10-CM

## 2020-09-03 DIAGNOSIS — I739 Peripheral vascular disease, unspecified: Secondary | ICD-10-CM

## 2020-09-06 ENCOUNTER — Ambulatory Visit
Admission: RE | Admit: 2020-09-06 | Discharge: 2020-09-06 | Disposition: A | Discharge status: Home / Self Care | Payer: Medicare Other | Source: Ambulatory Visit | Attending: Hospice and Palliative Medicine | Admitting: Hospice and Palliative Medicine

## 2020-09-06 ENCOUNTER — Other Ambulatory Visit: Payer: Self-pay

## 2020-09-06 ENCOUNTER — Encounter (INDEPENDENT_AMBULATORY_CARE_PROVIDER_SITE_OTHER): Payer: Self-pay | Admitting: Nurse Practitioner

## 2020-09-06 DIAGNOSIS — Z1231 Encounter for screening mammogram for malignant neoplasm of breast: Secondary | ICD-10-CM | POA: Insufficient documentation

## 2020-09-06 NOTE — Progress Notes (Signed)
Subjective:    Patient ID: Christina Hester, female    DOB: October 12, 1940, 80 y.o.   MRN: [id number] Chief Complaint  Patient presents with  . Follow-up    Ultrasound follow up    Patient returns today for follow-up regarding multiple vascular issues. The patient has a previous right carotid endarterectomy on 11/06/2019. The patient has been doing well postsurgically in the incision is barely visible. Patient has an abdominal aortic aneurysm as well as history of peripheral arterial disease. Patient denies any TIA-like symptoms thrombosis she checks. No worsening claudication-like symptoms. No signs symptoms of distal embolization. Overall patient is doing well. No fever or chills.  Today noninvasive studies show carotid stenosis of 1 to 39% bilaterally. Normal flow hemodynamics in the bilateral subclavian arteries with antegrade flow in the bilateral vertebrals.  Patient's abdominal aortic aneurysm measures 3.26 cm. This is essentially unchanged from the previous exam done on 10/04/19.  Today the patient has a right ABI of 1.02 with a left of 0.95. The patient has biphasic tibial artery waveforms bilaterally with good toe waveforms bilaterally. Previous ABIs done on 09/24/2019 shows a right ABI 0.94 with a left of 0.82. Left TBI also appears to be greatly improved as well today.   Review of Systems  Eyes: Negative for visual disturbance.  Cardiovascular: Negative for leg swelling.  Neurological: Negative for dizziness.  All other systems reviewed and are negative.      Objective:   Physical Exam Vitals reviewed.  HENT:     Head: Normocephalic.  Cardiovascular:     Rate and Rhythm: Normal rate.     Pulses: Normal pulses.  Pulmonary:     Effort: Pulmonary effort is normal.  Skin:    General: Skin is warm and dry.  Neurological:     Mental Status: She is alert and oriented to person, place, and time.  Psychiatric:        Mood and Affect: Mood normal.        Behavior: Behavior  normal.        Thought Content: Thought content normal.        Judgment: Judgment normal.     BP 127/71 (BP Location: Right Arm)   Pulse 93   Resp 15   Wt 76 lb (34.5 kg)   BMI 14.84 kg/m   Past Medical History:  Diagnosis Date  . AAA (abdominal aortic aneurysm) ([place])    a. 05/2016 Abd U/S: 3.02 x 3.02 AAA.  Marland Kitchen Anxiety   . CAD (coronary artery disease)    a. 02/2017 NSTEMI/Cath: LM nl, LAD mild dzs, D2 min irregs, LCX mild dzs, OM2 50ost, RCA 95p, 156m, L-->R collats, EF 55-65%.  Marland Kitchen COPD (chronic obstructive pulmonary disease) ([place])   . Diastolic dysfunction    a. 02/2017 Echo: EF 55-60%, gr1 DD, ? inf HK, mild MR.  Marland Kitchen Hx of basal cell carcinoma    back   . 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 Hyperlipidemia   . Hypertension   . PAD (peripheral artery disease) ([place])    a. 05/2016 ABI's: R 1.22, L 1.11.  . Tobacco abuse     Social History   Socioeconomic History  . Marital status: Divorced    Spouse name: Not on file  . Number of children: Not on file  . Years of education: Not on file  . Highest education level: Not on file  Occupational History  . Not on file  Tobacco  Use  . Smoking status: Former Smoker    Packs/day: 0.25    Quit date: 07/12/2017    Years since quitting: 3.1  . Smokeless tobacco: Never Used  . Tobacco comment: currently 90 days without smoking  Vaping Use  . Vaping Use: Never used  Substance and Sexual Activity  . Alcohol use: No  . Drug use: No  . Sexual activity: Not on file  Other Topics Concern  . Not on file  Social History Narrative  . Not on file   Social Determinants of Health   Financial Resource Strain: Not on file  Food Insecurity: Not on file  Transportation Needs: Not on file  Physical Activity: Not on file  Stress: Not on file  Social Connections: Not on file  Intimate Partner Violence: Not on file    Past Surgical History:  Procedure Laterality Date  . ABDOMINAL HYSTERECTOMY    .  APPENDECTOMY    . BREAST EXCISIONAL BIOPSY Left 80's or 90's   NEG  . CARDIAC CATHETERIZATION    . COLONOSCOPY N/A 05/22/2018   Procedure: COLONOSCOPY;  Surgeon: Lin Landsman, MD;  Location: [facility] ENDOSCOPY;  Service: Gastroenterology;  Laterality: N/A;  . COLONOSCOPY WITH PROPOFOL N/A 07/19/2020   Procedure: COLONOSCOPY WITH PROPOFOL;  Surgeon: Toledo, Benay Pike, MD;  Location: [facility] ENDOSCOPY;  Service: Gastroenterology;  Laterality: N/A;  . ENDARTERECTOMY Right 11/06/2019   Procedure: ENDARTERECTOMY CAROTID;  Surgeon: Algernon Huxley, MD;  Location: [facility] ORS;  Service: Vascular;  Laterality: Right;  . LEFT HEART CATH Bilateral 03/05/2017   Procedure: Left Heart Cath with poss PCI;  Surgeon: Wellington Hampshire, MD;  Location: [place] CV LAB;  Service: Cardiovascular;  Laterality: Bilateral;    Family History  Problem Relation Age of Onset  . Breast cancer Mother   . Dementia Mother   . Hypertension Mother   . Breast cancer Maternal Aunt   . Breast cancer Maternal Aunt   . Breast cancer Maternal Aunt   . CVA Father     Allergies  Allergen Reactions  . Aspirin Tinitus  . Prednisone Other (See Comments)    Makes violent  . Sulfa Antibiotics Nausea And Vomiting  . Symbicort [Budesonide-Formoterol Fumarate] Other (See Comments)    Patient felt "out of her mind" and angry.    CBC Latest Ref Rng & Units 11/07/2019 11/04/2019 05/23/2018  WBC 4.0 - 10.5 K/uL 5.5 5.5 7.8  Hemoglobin 12.0 - 15.0 g/dL 10.2(L) 12.6 10.1(L)  Hematocrit 36.0 - 46.0 % 29.1(L) 37.1 29.6(L)  Platelets 150 - 400 K/uL 160 242 200      CMP     Component Value Date/Time   NA 130 (L) 11/07/2019 0610   NA 135 01/07/2018 0858   NA 139 04/28/2014 1649   K 4.3 11/07/2019 0610   K 3.5 04/28/2014 1649   CL 97 (L) 11/07/2019 0610   CL 100 04/28/2014 1649   CO2 26 11/07/2019 0610   CO2 30 04/28/2014 1649   GLUCOSE 106 (H) 11/07/2019 0610   GLUCOSE 94 04/28/2014 1649   BUN 14 11/07/2019 0610   BUN 12  01/07/2018 0858   BUN 12 04/28/2014 1649   CREATININE 0.69 11/07/2019 0610   CREATININE 0.64 04/28/2014 1649   CALCIUM 8.6 (L) 11/07/2019 0610   CALCIUM 9.2 04/28/2014 1649   PROT 7.2 05/21/2018 0823   PROT 7.1 01/07/2018 0858   PROT 7.9 04/28/2014 1649   ALBUMIN 3.9 05/21/2018 0823   ALBUMIN 4.7 01/07/2018 0858  ALBUMIN 4.1 04/28/2014 1649   AST 23 05/21/2018 0823   AST 20 04/28/2014 1649   ALT 19 05/21/2018 0823   ALT 25 04/28/2014 1649   ALKPHOS 86 05/21/2018 0823   ALKPHOS 101 04/28/2014 1649   BILITOT 0.8 05/21/2018 0823   BILITOT 0.4 01/07/2018 0858   BILITOT 0.3 04/28/2014 1649   GFRNONAA >60 11/07/2019 0610   GFRNONAA >60 04/28/2014 1649   GFRAA >60 11/07/2019 0610   GFRAA >60 04/28/2014 1649     No results found.     Assessment & Plan:   1. Abdominal aortic aneurysm (AAA) without rupture ([place]) No surgery or intervention at this time. The patient has an asymptomatic abdominal aortic aneurysm that is less than 4 cm in maximal diameter.  I have discussed the natural history of abdominal aortic aneurysm and the small risk of rupture for aneurysm less than 5 cm in size.  However, as these small aneurysms tend to enlarge over time, continued surveillance with ultrasound or CT scan is mandatory.  I have also discussed optimizing medical management with hypertension and lipid control and the importance of abstinence from tobacco.  The patient is also encouraged to exercise a minimum of 30 minutes 4 times a week.  Should the patient develop new onset abdominal or back pain or signs of peripheral embolization they are instructed to seek medical attention immediately and to alert the physician providing care that they have an aneurysm.  The patient voices their understanding. The patient will return in 12 months with an aortic duplex.  - VAS [place] AAA DUPLEX; Future  2. Bilateral carotid artery stenosis Recommend:  Given the patient's asymptomatic subcritical stenosis no  further invasive testing or surgery at this time.  Duplex ultrasound shows <40% stenosis bilaterally.  Continue antiplatelet therapy as prescribed Continue management of CAD, HTN and Hyperlipidemia Healthy heart diet,  encouraged exercise at least 4 times per week Follow up in 12  months with duplex ultrasound and physical exam  - VAS US CAROTID; Future  3. PVD (peripheral vascular disease) ([place])  Recommend:  The patient has evidence of atherosclerosis of the lower extremities with claudication.  The patient does not voice lifestyle limiting changes at this point in time.  Noninvasive studies do not suggest clinically significant change.  No invasive studies, angiography or surgery at this time The patient should continue walking and begin a more formal exercise program.  The patient should continue antiplatelet therapy and aggressive treatment of the lipid abnormalities  No changes in the patient's medications at this time  - VAS [place] ABI WITH/WO TBI; Future  4. Essential hypertension, benign Continue antihypertensive medications as already ordered, these medications have been reviewed and there are no changes at this time.    Current Outpatient Medications on File Prior to Visit  Medication Sig Dispense Refill  . albuterol (VENTOLIN HFA) 108 (90 Base) MCG/ACT inhaler INHALE 2 PUFFS INTO THE LUNGS EVERY 6 HOURS AS NEEDED FOR WHEEZING ORSHORTNESS OF BREATH 18 g 3  . amLODipine (NORVASC) 2.5 MG tablet Take 1 tablet (2.5 mg total) by mouth daily. Take along with 5 mg daily for total of 7.5 mg. 90 tablet 3  . amLODipine (NORVASC) 5 MG tablet Take 1 tablet (5 mg total) by mouth daily. Take along with 2.5 mg for a total of 7.5 mg. 90 tablet 3  . benzonatate (TESSALON) 200 MG capsule Take 1 capsule (200 mg total) by mouth 2 (two) times daily as needed for cough. 45 capsule 1  .  buPROPion (WELLBUTRIN) 100 MG tablet Take 100 mg by mouth daily.    . clopidogrel (PLAVIX) 75 MG tablet Take 1  tablet (75 mg total) by mouth daily. 90 tablet 3  . clotrimazole (MYCELEX) 10 MG troche DISSOLVE 1 TABLET BY MOUTH 5 TIMES DAILY 70 tablet 0  . ipratropium-albuterol (DUONEB) 0.5-2.5 (3) MG/3ML SOLN INHALE THE CONTENTS OF 1 VIAL (3 ML) VIANEBULIZER EVERY 4 HOURS 360 mL 3  . LORazepam (ATIVAN) 0.5 MG tablet Take 1 tablet (0.5 mg total) by mouth at bedtime as needed. TAKE 1/2 TO 1 TABLET BY MOUTH AT NIGHT 30 tablet 1  . meloxicam (MOBIC) 15 MG tablet Take 1 tablet (15 mg total) by mouth daily. 30 tablet 0  . nitroGLYCERIN (NITROSTAT) 0.4 MG SL tablet Place 1 tablet (0.4 mg total) under the tongue every 5 (five) minutes as needed for chest pain. 20 tablet 12  . nystatin (MYCOSTATIN) 100000 UNIT/ML suspension Take 5 mLs (500,000 Units total) by mouth 4 (four) times daily. 200 mL 1  . Tiotropium Bromide Monohydrate (SPIRIVA RESPIMAT) 2.5 MCG/ACT AERS Inhale 1 puff into the lungs daily. 30 Inhaler 5   No current facility-administered medications on file prior to visit.    There are no Patient Instructions on file for this visit. No follow-ups on file.   Kris Hartmann, NP

## 2020-09-08 ENCOUNTER — Ambulatory Visit (INDEPENDENT_AMBULATORY_CARE_PROVIDER_SITE_OTHER): Payer: Medicare Other | Admitting: Internal Medicine

## 2020-09-08 ENCOUNTER — Other Ambulatory Visit: Payer: Self-pay

## 2020-09-08 DIAGNOSIS — R0602 Shortness of breath: Secondary | ICD-10-CM

## 2020-09-08 DIAGNOSIS — J449 Chronic obstructive pulmonary disease, unspecified: Secondary | ICD-10-CM

## 2020-09-08 LAB — PULMONARY FUNCTION TEST

## 2020-09-14 ENCOUNTER — Encounter: Payer: Self-pay | Admitting: Hospice and Palliative Medicine

## 2020-09-14 ENCOUNTER — Ambulatory Visit (INDEPENDENT_AMBULATORY_CARE_PROVIDER_SITE_OTHER): Payer: Medicare Other | Admitting: Hospice and Palliative Medicine

## 2020-09-14 ENCOUNTER — Other Ambulatory Visit: Payer: Self-pay

## 2020-09-14 VITALS — BP 142/74 | HR 98 | Temp 97.3°F | Resp 16 | Ht 60.0 in | Wt 76.6 lb

## 2020-09-14 DIAGNOSIS — R0602 Shortness of breath: Secondary | ICD-10-CM | POA: Diagnosis not present

## 2020-09-14 DIAGNOSIS — R634 Abnormal weight loss: Secondary | ICD-10-CM

## 2020-09-14 DIAGNOSIS — J441 Chronic obstructive pulmonary disease with (acute) exacerbation: Secondary | ICD-10-CM | POA: Diagnosis not present

## 2020-09-14 DIAGNOSIS — R059 Cough, unspecified: Secondary | ICD-10-CM

## 2020-09-14 DIAGNOSIS — G4734 Idiopathic sleep related nonobstructive alveolar hypoventilation: Secondary | ICD-10-CM

## 2020-09-14 MED ORDER — TRELEGY ELLIPTA 100-62.5-25 MCG/INH IN AEPB: INHALATION_SPRAY | RESPIRATORY_TRACT | 2 refills | Status: DC

## 2020-09-14 MED ORDER — AZITHROMYCIN 250 MG PO TABS: ORAL_TABLET | ORAL | 0 refills | Status: DC

## 2020-09-14 MED ORDER — PREDNISONE 10 MG PO TABS: ORAL_TABLET | ORAL | 0 refills | Status: DC

## 2020-09-14 NOTE — Progress Notes (Signed)
[facility] [place], [place] [address]  Internal MEDICINE  Office Visit Note  Patient Name: Christina Hester  [id number]  [id number]  Date of Service: 09/15/2020  Chief Complaint  Patient presents with  . Acute Visit    Breathing issues, wheezing, SOB with walking, not improving long term with inhalers and breathing treatments     HPI Pt is here for a sick visit. C/o increased shortness of breath, breathing has worsened over the last few days, even at rest feels slightly short of breath Mild dry cough Has been using her albuterol inhaler every day multiple times per day, has not noticed significant relief with this Has been tried on multiple different inhalers in the past but does not feel that any of them have provided her any improvement in her symptoms  Continues to wear supplemental oxygen at night for nocturnal hypoxia--does not enjoy wearing it but is complaint  Recent CXR negative for acute abnormality Last CT 2018 Last echocardiogram 2018--diastolic dysfunction, mild mitral valve regurgitation   Continues to struggle with weight loss--has been increasing her food and protein intake but continues to lose weight Not a fan of Ensure as they leave a bad aftertaste Last colonoscopy January 2022--normal, recommend no further screenings  Current Medication:  Outpatient Encounter Medications as of 09/14/2020  Medication Sig  . azithromycin (ZITHROMAX) 250 MG tablet Take one tab po qd for 10 days  . Fluticasone-Umeclidin-Vilant (TRELEGY ELLIPTA) 100-62.5-25 MCG/INH AEPB Inhale one puff once a day.  . predniSONE (DELTASONE) 10 MG tablet Take 1 tablet three times a day with a meal for three for three days, take 1 tablet by twice daily with a meal for 3 days, take 1 tablet once daily with a meal for 3 days  . albuterol (VENTOLIN HFA) 108 (90 Base) MCG/ACT inhaler INHALE 2 PUFFS INTO THE LUNGS EVERY 6 HOURS AS NEEDED FOR WHEEZING ORSHORTNESS OF BREATH  .  amLODipine (NORVASC) 2.5 MG tablet Take 1 tablet (2.5 mg total) by mouth daily. Take along with 5 mg daily for total of 7.5 mg.  . amLODipine (NORVASC) 5 MG tablet Take 1 tablet (5 mg total) by mouth daily. Take along with 2.5 mg for a total of 7.5 mg.  . benzonatate (TESSALON) 200 MG capsule Take 1 capsule (200 mg total) by mouth 2 (two) times daily as needed for cough.  Marland Kitchen buPROPion (WELLBUTRIN) 100 MG tablet Take 100 mg by mouth daily.  . clopidogrel (PLAVIX) 75 MG tablet Take 1 tablet (75 mg total) by mouth daily.  . clotrimazole (MYCELEX) 10 MG troche DISSOLVE 1 TABLET BY MOUTH 5 TIMES DAILY  . ipratropium-albuterol (DUONEB) 0.5-2.5 (3) MG/3ML SOLN INHALE THE CONTENTS OF 1 VIAL (3 ML) VIANEBULIZER EVERY 4 HOURS  . LORazepam (ATIVAN) 0.5 MG tablet Take 1 tablet (0.5 mg total) by mouth at bedtime as needed. TAKE 1/2 TO 1 TABLET BY MOUTH AT NIGHT  . meloxicam (MOBIC) 15 MG tablet Take 1 tablet (15 mg total) by mouth daily.  . nitroGLYCERIN (NITROSTAT) 0.4 MG SL tablet Place 1 tablet (0.4 mg total) under the tongue every 5 (five) minutes as needed for chest pain.  Marland Kitchen nystatin (MYCOSTATIN) 100000 UNIT/ML suspension Take 5 mLs (500,000 Units total) by mouth 4 (four) times daily.  . Tiotropium Bromide Monohydrate (SPIRIVA RESPIMAT) 2.5 MCG/ACT AERS Inhale 1 puff into the lungs daily.   No facility-administered encounter medications on file as of 09/14/2020.      Medical History: Past Medical History:  Diagnosis Date  . AAA (abdominal aortic aneurysm) ([place])    a. 05/2016 Abd U/S: 3.02 x 3.02 AAA.  Marland Kitchen Anxiety   . CAD (coronary artery disease)    a. 02/2017 NSTEMI/Cath: LM nl, LAD mild dzs, D2 min irregs, LCX mild dzs, OM2 50ost, RCA 95p, 115m, L-->R collats, EF 55-65%.  Marland Kitchen COPD (chronic obstructive pulmonary disease) ([place])   . Diastolic dysfunction    a. 02/2017 Echo: EF 55-60%, gr1 DD, ? inf HK, mild MR.  Marland Kitchen Hx of basal cell carcinoma    back   . 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 Hyperlipidemia   . Hypertension   . PAD (peripheral artery disease) ([place])    a. 05/2016 ABI's: R 1.22, L 1.11.  . Tobacco abuse      Vital Signs: BP (!) 142/74   Pulse 98   Temp (!) 97.3 F (36.3 C)   Resp 16   Ht 5' (1.524 m)   Wt 76 lb 9.6 oz (34.7 kg)   SpO2 97%   BMI 14.96 kg/m    Review of Systems  Constitutional: Negative for chills, diaphoresis and fatigue.  HENT: Negative for ear pain, postnasal drip and sinus pressure.   Eyes: Negative for photophobia, discharge, redness, itching and visual disturbance.  Respiratory: Positive for cough, shortness of breath and wheezing.   Cardiovascular: Negative for chest pain, palpitations and leg swelling.  Gastrointestinal: Negative for abdominal pain, constipation, diarrhea, nausea and vomiting.  Genitourinary: Negative for dysuria and flank pain.  Musculoskeletal: Negative for arthralgias, back pain, gait problem and neck pain.  Skin: Negative for color change.  Allergic/Immunologic: Negative for environmental allergies and food allergies.  Neurological: Negative for dizziness and headaches.  Hematological: Does not bruise/bleed easily.  Psychiatric/Behavioral: Negative for agitation, behavioral problems (depression) and hallucinations.    Physical Exam Vitals reviewed.  Constitutional:      Appearance: Normal appearance. She is underweight.  Cardiovascular:     Rate and Rhythm: Normal rate and regular rhythm.     Pulses: Normal pulses.     Heart sounds: Normal heart sounds.  Pulmonary:     Breath sounds: Normal breath sounds.     Comments: SHOB noted with conversing Abdominal:     General: Abdomen is flat.     Palpations: Abdomen is soft.  Musculoskeletal:        General: Normal range of motion.     Cervical back: Normal range of motion.  Skin:    General: Skin is warm.  Neurological:     General: No focal deficit present.     Mental Status: She is alert and oriented to person, place, and  time. Mental status is at baseline.  Psychiatric:        Mood and Affect: Mood normal.        Behavior: Behavior normal.        Thought Content: Thought content normal.        Judgment: Judgment normal.    Assessment/Plan: 1. Chronic obstructive pulmonary disease with (acute) exacerbation (HCC)  Treat acute exacerbation with azithromycin as well as short course prednisone Send script for Trelegy--if unable to afford will need to look into medication assistance programs - Fluticasone-Umeclidin-Vilant (TRELEGY ELLIPTA) 100-62.5-25 MCG/INH AEPB; Inhale one puff once a day.  Dispense: 1 each; Refill: 2 - azithromycin (ZITHROMAX) 250 MG tablet; Take one tab po qd for 10 days  Dispense: 7 tablet; Refill: 0 - predniSONE (DELTASONE) 10 MG  tablet; Take 1 tablet three times a day with a meal for three for three days, take 1 tablet by twice daily with a meal for 3 days, take 1 tablet once daily with a meal for 3 days  Dispense: 18 tablet; Refill: 0 - CT Chest Wo Contrast; Future  2. Cough Multiple risk factors include; COPD exacerbation, SHOB at rest, unintentional weight loss Review CT chest for possible underlying disease process - CT Chest Wo Contrast; Future  3. Unintentional weight loss Multiple risk factors include; COPD exacerbation, SHOB at rest, unintentional weight loss Review CT chest for possible underlying disease process Encouraged to continue increased PO intake, specifically protein--discussed protein powder to mix into smoothies - CT Chest Wo Contrast; Future  4. SOB (shortness of breath) Will review echocardiogram to rule out cardiac etiology for increased shortness of breath - ECHOCARDIOGRAM COMPLETE; Future  5. Nocturnal hypoxia Encouraged to continue with nightly compliance with nocturnal oxygen  General Counseling: Abeera verbalizes understanding of the findings of todays visit and agrees with plan of treatment. I have discussed any further diagnostic evaluation that may  be needed or ordered today. We also reviewed her medications today. she has been encouraged to call the office with any questions or concerns that should arise related to todays visit.   Orders Placed This Encounter  Procedures  . CT Chest Wo Contrast  . ECHOCARDIOGRAM COMPLETE    Meds ordered this encounter  Medications  . Fluticasone-Umeclidin-Vilant (TRELEGY ELLIPTA) 100-62.5-25 MCG/INH AEPB    Sig: Inhale one puff once a day.    Dispense:  1 each    Refill:  2  . azithromycin (ZITHROMAX) 250 MG tablet    Sig: Take one tab po qd for 10 days    Dispense:  7 tablet    Refill:  0  . predniSONE (DELTASONE) 10 MG tablet    Sig: Take 1 tablet three times a day with a meal for three for three days, take 1 tablet by twice daily with a meal for 3 days, take 1 tablet once daily with a meal for 3 days    Dispense:  18 tablet    Refill:  0    Time spent: 30 Minutes Time spent includes review of chart, medications, test results and follow-up plan with the patient.  This patient was seen by Theodoro Grist AGNP-C in Collaboration with Dr Lavera Guise as a part of collaborative care agreement.  Tanna Furry [facility] Internal Medicine

## 2020-09-15 ENCOUNTER — Encounter: Payer: Self-pay | Admitting: Hospice and Palliative Medicine

## 2020-09-26 NOTE — Procedures (Signed)
[facility] [address] [place], [address]    Complete Pulmonary Function Testing Interpretation:  FINDINGS:  The forced vital capacity is moderately decreased.  FEV1 is 0.6 L which is 35% of predicted and is severely decreased.  F1 FVC ratio is severely decreased.  Postbronchodilator no significant improvement in the FEV1.  Total lung capacity is increased residual volume is increased with prolonged on capacity ratio is increased FRC is increased.  Patient was not able to perform the DLCO maneuvers.  IMPRESSION:  This pulmonary function study is suggestive of severe obstructive lung disease.  Clinical correlation is recommended  Allyne Gee, MD [facility] Pulmonary Critical Care Medicine Sleep Medicine

## 2020-09-27 ENCOUNTER — Telehealth: Payer: Self-pay

## 2020-09-27 NOTE — Telephone Encounter (Signed)
Confirmed and screened for 09-29-20 ov.

## 2020-09-29 ENCOUNTER — Other Ambulatory Visit: Payer: Medicare Other

## 2020-09-29 ENCOUNTER — Ambulatory Visit: Payer: Medicare Other | Admitting: Hospice and Palliative Medicine

## 2020-09-29 ENCOUNTER — Other Ambulatory Visit: Payer: Self-pay

## 2020-09-29 ENCOUNTER — Ambulatory Visit
Admission: RE | Admit: 2020-09-29 | Discharge: 2020-09-29 | Disposition: A | Discharge status: Home / Self Care | Payer: Medicare Other | Source: Ambulatory Visit | Attending: Hospice and Palliative Medicine | Admitting: Hospice and Palliative Medicine

## 2020-09-29 DIAGNOSIS — J441 Chronic obstructive pulmonary disease with (acute) exacerbation: Secondary | ICD-10-CM | POA: Insufficient documentation

## 2020-09-29 DIAGNOSIS — R059 Cough, unspecified: Secondary | ICD-10-CM | POA: Diagnosis not present

## 2020-09-29 DIAGNOSIS — R634 Abnormal weight loss: Secondary | ICD-10-CM | POA: Insufficient documentation

## 2020-09-29 DIAGNOSIS — I714 Abdominal aortic aneurysm, without rupture: Secondary | ICD-10-CM | POA: Diagnosis not present

## 2020-09-29 DIAGNOSIS — J438 Other emphysema: Secondary | ICD-10-CM | POA: Diagnosis not present

## 2020-09-29 DIAGNOSIS — I2510 Atherosclerotic heart disease of native coronary artery without angina pectoris: Secondary | ICD-10-CM | POA: Diagnosis not present

## 2020-09-29 DIAGNOSIS — I700 Atherosclerosis of aorta: Secondary | ICD-10-CM | POA: Diagnosis not present

## 2020-09-30 ENCOUNTER — Ambulatory Visit (INDEPENDENT_AMBULATORY_CARE_PROVIDER_SITE_OTHER): Payer: Medicare Other | Admitting: Hospice and Palliative Medicine

## 2020-09-30 ENCOUNTER — Encounter: Payer: Self-pay | Admitting: Hospice and Palliative Medicine

## 2020-09-30 VITALS — BP 138/74 | HR 100 | Temp 97.8°F | Resp 16 | Ht 60.0 in | Wt 77.2 lb

## 2020-09-30 DIAGNOSIS — F411 Generalized anxiety disorder: Secondary | ICD-10-CM

## 2020-09-30 DIAGNOSIS — I10 Essential (primary) hypertension: Secondary | ICD-10-CM | POA: Diagnosis not present

## 2020-09-30 DIAGNOSIS — J441 Chronic obstructive pulmonary disease with (acute) exacerbation: Secondary | ICD-10-CM | POA: Diagnosis not present

## 2020-09-30 MED ORDER — LORAZEPAM 0.5 MG PO TABS: 0.5000 mg | ORAL_TABLET | Freq: Every evening | ORAL | 1 refills | Status: DC | PRN

## 2020-09-30 MED ORDER — DALIRESP 250 MCG PO TABS
250.0000 ug | ORAL_TABLET | Freq: Every day | ORAL | 0 refills | Status: DC
Start: 2020-09-30 — End: 2020-10-08

## 2020-09-30 NOTE — Progress Notes (Signed)
[facility] [place], [place] [address]  Internal MEDICINE  Office Visit Note  Patient Name: Christina Hester  [id number]  [id number]  Date of Service: 10/01/2020  Chief Complaint  Patient presents with  . Follow-up    Review ct, discuss meds   . Hyperlipidemia  . Hypertension  . Anxiety  . COPD    HPI Patient is here for routine follow-up Breathing and shortness of breath has improved since our last visit Had chest CT scan yesterday--results not yet available, will call to discuss PFT revealed severe obstructive lung disease Has not yet been scheduled for echocardiogram  Unable to tolerate Trelegy--samples given at last visit, would like to continue with her current inhalers as she has been tried on different options in the past which have all caused negative side effects    Current Medication: Outpatient Encounter Medications as of 09/30/2020  Medication Sig  . Roflumilast (DALIRESP) 250 MCG TABS Take 250 mcg by mouth daily.  Marland Kitchen albuterol (VENTOLIN HFA) 108 (90 Base) MCG/ACT inhaler INHALE 2 PUFFS INTO THE LUNGS EVERY 6 HOURS AS NEEDED FOR WHEEZING ORSHORTNESS OF BREATH  . amLODipine (NORVASC) 2.5 MG tablet Take 1 tablet (2.5 mg total) by mouth daily. Take along with 5 mg daily for total of 7.5 mg.  . amLODipine (NORVASC) 5 MG tablet Take 1 tablet (5 mg total) by mouth daily. Take along with 2.5 mg for a total of 7.5 mg.  . azithromycin (ZITHROMAX) 250 MG tablet Take one tab po qd for 10 days  . buPROPion (WELLBUTRIN) 100 MG tablet Take 100 mg by mouth daily.  . clopidogrel (PLAVIX) 75 MG tablet Take 1 tablet (75 mg total) by mouth daily.  . clotrimazole (MYCELEX) 10 MG troche DISSOLVE 1 TABLET BY MOUTH 5 TIMES DAILY  . ipratropium-albuterol (DUONEB) 0.5-2.5 (3) MG/3ML SOLN INHALE THE CONTENTS OF 1 VIAL (3 ML) VIANEBULIZER EVERY 4 HOURS  . LORazepam (ATIVAN) 0.5 MG tablet Take 1 tablet (0.5 mg total) by mouth at bedtime as needed. TAKE 1/2 TO 1  TABLET BY MOUTH AT NIGHT  . meloxicam (MOBIC) 15 MG tablet Take 1 tablet (15 mg total) by mouth daily.  . nitroGLYCERIN (NITROSTAT) 0.4 MG SL tablet Place 1 tablet (0.4 mg total) under the tongue every 5 (five) minutes as needed for chest pain.  Marland Kitchen nystatin (MYCOSTATIN) 100000 UNIT/ML suspension Take 5 mLs (500,000 Units total) by mouth 4 (four) times daily.  . Tiotropium Bromide Monohydrate (SPIRIVA RESPIMAT) 2.5 MCG/ACT AERS Inhale 1 puff into the lungs daily.  . [DISCONTINUED] benzonatate (TESSALON) 200 MG capsule Take 1 capsule (200 mg total) by mouth 2 (two) times daily as needed for cough.  . [DISCONTINUED] Fluticasone-Umeclidin-Vilant (TRELEGY ELLIPTA) 100-62.5-25 MCG/INH AEPB Inhale one puff once a day.  . [DISCONTINUED] LORazepam (ATIVAN) 0.5 MG tablet Take 1 tablet (0.5 mg total) by mouth at bedtime as needed. TAKE 1/2 TO 1 TABLET BY MOUTH AT NIGHT  . [DISCONTINUED] predniSONE (DELTASONE) 10 MG tablet Take 1 tablet three times a day with a meal for three for three days, take 1 tablet by twice daily with a meal for 3 days, take 1 tablet once daily with a meal for 3 days   No facility-administered encounter medications on file as of 09/30/2020.    Surgical History: Past Surgical History:  Procedure Laterality Date  . ABDOMINAL HYSTERECTOMY    . APPENDECTOMY    . BREAST EXCISIONAL BIOPSY Left 80's or 90's   NEG  . CARDIAC CATHETERIZATION    .  COLONOSCOPY N/A 05/22/2018   Procedure: COLONOSCOPY;  Surgeon: Lin Landsman, MD;  Location: [facility] ENDOSCOPY;  Service: Gastroenterology;  Laterality: N/A;  . COLONOSCOPY WITH PROPOFOL N/A 07/19/2020   Procedure: COLONOSCOPY WITH PROPOFOL;  Surgeon: Toledo, Benay Pike, MD;  Location: [facility] ENDOSCOPY;  Service: Gastroenterology;  Laterality: N/A;  . ENDARTERECTOMY Right 11/06/2019   Procedure: ENDARTERECTOMY CAROTID;  Surgeon: Algernon Huxley, MD;  Location: [facility] ORS;  Service: Vascular;  Laterality: Right;  . LEFT HEART CATH Bilateral 03/05/2017    Procedure: Left Heart Cath with poss PCI;  Surgeon: Wellington Hampshire, MD;  Location: [facility] CV LAB;  Service: Cardiovascular;  Laterality: Bilateral;    Medical History: Past Medical History:  Diagnosis Date  . AAA (abdominal aortic aneurysm) ([place])    a. 05/2016 Abd U/S: 3.02 x 3.02 AAA.  Marland Kitchen Anxiety   . CAD (coronary artery disease)    a. 02/2017 NSTEMI/Cath: LM nl, LAD mild dzs, D2 min irregs, LCX mild dzs, OM2 50ost, RCA 95p, 164m, L-->R collats, EF 55-65%.  Marland Kitchen COPD (chronic obstructive pulmonary disease) ([place])   . Diastolic dysfunction    a. 02/2017 Echo: EF 55-60%, gr1 DD, ? inf HK, mild MR.  Marland Kitchen Hx of basal cell carcinoma    back   . 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 Hyperlipidemia   . Hypertension   . PAD (peripheral artery disease) ([place])    a. 05/2016 ABI's: R 1.22, L 1.11.  . Tobacco abuse     Family History: Family History  Problem Relation Age of Onset  . Breast cancer Mother   . Dementia Mother   . Hypertension Mother   . Breast cancer Maternal Aunt   . Breast cancer Maternal Aunt   . Breast cancer Maternal Aunt   . CVA Father     Social History   Socioeconomic History  . Marital status: Divorced    Spouse name: Not on file  . Number of children: Not on file  . Years of education: Not on file  . Highest education level: Not on file  Occupational History  . Not on file  Tobacco Use  . Smoking status: Former Smoker    Packs/day: 0.25    Quit date: 07/12/2017    Years since quitting: 3.2  . Smokeless tobacco: Never Used  . Tobacco comment: currently 90 days without smoking  Vaping Use  . Vaping Use: Never used  Substance and Sexual Activity  . Alcohol use: No  . Drug use: No  . Sexual activity: Not on file  Other Topics Concern  . Not on file  Social History Narrative  . Not on file   Social Determinants of Health   Financial Resource Strain: Not on file  Food Insecurity: Not on file  Transportation  Needs: Not on file  Physical Activity: Not on file  Stress: Not on file  Social Connections: Not on file  Intimate Partner Violence: Not on file      Review of Systems  Constitutional: Negative for chills, diaphoresis and fatigue.  HENT: Negative for ear pain, postnasal drip and sinus pressure.   Eyes: Negative for photophobia, discharge, redness, itching and visual disturbance.  Respiratory: Negative for cough, shortness of breath and wheezing.   Cardiovascular: Negative for chest pain, palpitations and leg swelling.  Gastrointestinal: Negative for abdominal pain, constipation, diarrhea, nausea and vomiting.  Genitourinary: Negative for dysuria and flank pain.  Musculoskeletal: Negative for arthralgias, back pain, gait  problem and neck pain.  Skin: Negative for color change.  Allergic/Immunologic: Negative for environmental allergies and food allergies.  Neurological: Negative for dizziness and headaches.  Hematological: Does not bruise/bleed easily.  Psychiatric/Behavioral: Negative for agitation, behavioral problems (depression) and hallucinations.    Vital Signs: BP 138/74   Pulse 100   Temp 97.8 F (36.6 C)   Resp 16   Ht 5' (1.524 m)   Wt 77 lb 3.2 oz (35 kg)   SpO2 98%   BMI 15.08 kg/m    Physical Exam Vitals reviewed.  Constitutional:      Appearance: Normal appearance. She is normal weight.  Cardiovascular:     Rate and Rhythm: Normal rate and regular rhythm.     Pulses: Normal pulses.     Heart sounds: Normal heart sounds.  Pulmonary:     Effort: Pulmonary effort is normal.     Breath sounds: Normal breath sounds.  Abdominal:     General: Abdomen is flat.     Palpations: Abdomen is soft.  Musculoskeletal:        General: Normal range of motion.     Cervical back: Normal range of motion.  Skin:    General: Skin is warm.  Neurological:     General: No focal deficit present.     Mental Status: She is alert and oriented to person, place, and time.  Mental status is at baseline.  Psychiatric:        Mood and Affect: Mood normal.        Behavior: Behavior normal.        Thought Content: Thought content normal.        Judgment: Judgment normal.    Assessment/Plan: 1. Chronic obstructive pulmonary disease with (acute) exacerbation (HCC)  Resolving acute exacerbation, add Daliresp due to severe disease and to prevent further exacerbations - Roflumilast (DALIRESP) 250 MCG TABS; Take 250 mcg by mouth daily.  Dispense: 90 tablet; Refill: 0  2. Generalized anxiety disorder Encouraged to utilize lorazepam as needed to help with anxiety and shortness of breath - LORazepam (ATIVAN) 0.5 MG tablet; Take 1 tablet (0.5 mg total) by mouth at bedtime as needed. TAKE 1/2 TO 1 TABLET BY MOUTH AT NIGHT  Dispense: 30 tablet; Refill: 1  3. Essential hypertension BP and HR well controlled  General Counseling: Maciah verbalizes understanding of the findings of todays visit and agrees with plan of treatment. I have discussed any further diagnostic evaluation that may be needed or ordered today. We also reviewed her medications today. she has been encouraged to call the office with any questions or concerns that should arise related to todays visit.   Meds ordered this encounter  Medications  . LORazepam (ATIVAN) 0.5 MG tablet    Sig: Take 1 tablet (0.5 mg total) by mouth at bedtime as needed. TAKE 1/2 TO 1 TABLET BY MOUTH AT NIGHT    Dispense:  30 tablet    Refill:  1  . Roflumilast (DALIRESP) 250 MCG TABS    Sig: Take 250 mcg by mouth daily.    Dispense:  90 tablet    Refill:  0    Time spent: 30 Minutes Time spent includes review of chart, medications, test results and follow-up plan with the patient.  This patient was seen by Theodoro Grist AGNP-C in Collaboration with Dr Lavera Guise as a part of collaborative care agreement     Tanna Furry. Kriti Katayama AGNP-C Internal medicine

## 2020-10-01 ENCOUNTER — Encounter: Payer: Self-pay | Admitting: Hospice and Palliative Medicine

## 2020-10-01 NOTE — Progress Notes (Signed)
Called and spoke with patient regarding results of CT scan.

## 2020-10-06 ENCOUNTER — Ambulatory Visit: Payer: Medicare Other

## 2020-10-06 ENCOUNTER — Telehealth: Payer: Self-pay | Admitting: Internal Medicine

## 2020-10-06 DIAGNOSIS — R0602 Shortness of breath: Secondary | ICD-10-CM

## 2020-10-06 NOTE — Progress Notes (Signed)
  Chronic Care Management   Note  9/62/9528 Name: Christina Hester MRN: [id number] DOB: [birth year]  [name] is a 80 y.o. year old female who is a primary care patient of Lavera Guise, MD. I reached out to [employer] by phone today in response to a referral sent by [name]'s PCP, Lavera Guise, MD.   [name] was given information about Chronic Care Management services today including:  1. CCM service includes personalized support from designated clinical staff supervised by her physician, including individualized plan of care and coordination with other care providers 2. 24/7 contact phone numbers for assistance for urgent and routine care needs. 3. Service will only be billed when office clinical staff spend 20 minutes or more in a month to coordinate care. 4. Only one practitioner may furnish and bill the service in a calendar month. 5. The patient may stop CCM services at any time (effective at the end of the month) by phone call to the office staff.   Patient wishes to consider information provided and/or speak with a member of the care team before deciding about enrollment in care management services.   Follow up plan:   Carley Perdue [employer] Scheduler

## 2020-10-07 ENCOUNTER — Telehealth: Payer: Self-pay

## 2020-10-07 NOTE — Telephone Encounter (Signed)
[employer] and finished PA for [employer] over the phone.  Spoke to Winnie from [employer]

## 2020-10-08 ENCOUNTER — Other Ambulatory Visit: Payer: Self-pay

## 2020-10-08 DIAGNOSIS — J441 Chronic obstructive pulmonary disease with (acute) exacerbation: Secondary | ICD-10-CM

## 2020-10-08 MED ORDER — DALIRESP 250 MCG PO TABS: 250.0000 ug | ORAL_TABLET | Freq: Every day | ORAL | 0 refills | Status: DC

## 2020-10-12 ENCOUNTER — Telehealth: Payer: Self-pay

## 2020-10-12 NOTE — Telephone Encounter (Signed)
PA for DALIRESP 250 MCG was denied on 10/07/20 and I called [employer] and done an appeal on phone along with Lovena Le. Spoke to: [facility] Ref # : [id number]

## 2020-10-13 ENCOUNTER — Other Ambulatory Visit: Payer: Medicare Other

## 2020-10-22 ENCOUNTER — Telehealth: Payer: Self-pay

## 2020-10-22 NOTE — Telephone Encounter (Signed)
Faxed order from [facility] for nebulizer medications and supplies

## 2020-11-05 ENCOUNTER — Other Ambulatory Visit: Payer: Self-pay

## 2020-11-05 ENCOUNTER — Encounter: Payer: Self-pay | Admitting: Family

## 2020-11-05 ENCOUNTER — Ambulatory Visit (INDEPENDENT_AMBULATORY_CARE_PROVIDER_SITE_OTHER): Payer: Medicare Other | Admitting: Family

## 2020-11-05 VITALS — BP 130/56 | HR 95 | Ht 60.0 in | Wt 76.0 lb

## 2020-11-05 DIAGNOSIS — I6523 Occlusion and stenosis of bilateral carotid arteries: Secondary | ICD-10-CM | POA: Diagnosis not present

## 2020-11-05 DIAGNOSIS — I739 Peripheral vascular disease, unspecified: Secondary | ICD-10-CM

## 2020-11-05 DIAGNOSIS — J449 Chronic obstructive pulmonary disease, unspecified: Secondary | ICD-10-CM

## 2020-11-05 DIAGNOSIS — I10 Essential (primary) hypertension: Secondary | ICD-10-CM

## 2020-11-05 DIAGNOSIS — I25118 Atherosclerotic heart disease of native coronary artery with other forms of angina pectoris: Secondary | ICD-10-CM | POA: Diagnosis not present

## 2020-11-05 DIAGNOSIS — I714 Abdominal aortic aneurysm, without rupture, unspecified: Secondary | ICD-10-CM

## 2020-11-05 NOTE — Progress Notes (Signed)
Office Visit    Patient Name: Christina Hester Date of Encounter: 11/05/2020  PCP:  Lavera Guise, MD   [place]  Cardiologist:  Kathlyn Sacramento, MD  Advanced Practice Provider:  No care team member to display Electrophysiologist:  None   Chief Complaint    Christina Hester is a 80 y.o. female with a hx of CAD, COPD, carotid disease s/p right carotid endarterectomy, AAA followed by AVVS, tobacco abuse presents today for follow-up of coronary artery disease  Past Medical History    Past Medical History:  Diagnosis Date  . AAA (abdominal aortic aneurysm) ([place])    a. 05/2016 Abd U/S: 3.02 x 3.02 AAA.  Marland Kitchen Anxiety   . CAD (coronary artery disease)    a. 02/2017 NSTEMI/Cath: LM nl, LAD mild dzs, D2 min irregs, LCX mild dzs, OM2 50ost, RCA 95p, 153m, L-->R collats, EF 55-65%.  Marland Kitchen COPD (chronic obstructive pulmonary disease) ([place])   . Diastolic dysfunction    a. 02/2017 Echo: EF 55-60%, gr1 DD, ? inf HK, mild MR.  Marland Kitchen Hx of basal cell carcinoma    back   . 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 Hyperlipidemia   . Hypertension   . PAD (peripheral artery disease) ([place])    a. 05/2016 ABI's: R 1.22, L 1.11.  . Tobacco abuse    Past Surgical History:  Procedure Laterality Date  . ABDOMINAL HYSTERECTOMY    . APPENDECTOMY    . BREAST EXCISIONAL BIOPSY Left 80's or 90's   NEG  . CARDIAC CATHETERIZATION    . COLONOSCOPY N/A 05/22/2018   Procedure: COLONOSCOPY;  Surgeon: Lin Landsman, MD;  Location: [facility] ENDOSCOPY;  Service: Gastroenterology;  Laterality: N/A;  . COLONOSCOPY WITH PROPOFOL N/A 07/19/2020   Procedure: COLONOSCOPY WITH PROPOFOL;  Surgeon: Toledo, Benay Pike, MD;  Location: [facility] ENDOSCOPY;  Service: Gastroenterology;  Laterality: N/A;  . ENDARTERECTOMY Right 11/06/2019   Procedure: ENDARTERECTOMY CAROTID;  Surgeon: Algernon Huxley, MD;  Location: [facility] ORS;  Service: Vascular;  Laterality: Right;  . LEFT HEART CATH  Bilateral 03/05/2017   Procedure: Left Heart Cath with poss PCI;  Surgeon: Wellington Hampshire, MD;  Location: [place] CV LAB;  Service: Cardiovascular;  Laterality: Bilateral;    Allergies  Allergies  Allergen Reactions  . Aspirin Tinitus  . Prednisone Other (See Comments)    Makes violent  . Sulfa Antibiotics Nausea And Vomiting  . Symbicort [Budesonide-Formoterol Fumarate] Other (See Comments)    Patient felt "out of her mind" and angry.    History of Present Illness    Christina Hester is a 80 y.o. female with a hx of CAD, COPD, carotid disease s/p right carotid endarterectomy, AAA followed by AVVS, tobacco abuse  last seen 10/24/2019.  Cardiac catheterization 02/2017 with severe one-vessel CAD with chronically occluded RCA with well developed left-to-right collaterals. Mild to moderate nonobstructiev disease to left coronary arteries with extremely tortuous vessels. EF normal with mildly elevated LVEDP.    Hx of intolerance to aspirin due to ringing in her ears. Tolerates Plavix. Declines statins due to fear of side effects. Trialed Zeta, but reported leg pain.    Seen by Dr. Lucky Cowboy 10/21/19. She had a CT angiogram with official report suggesting 55% right ICA stenosis and 50% left ICA stenosis. However, on review by Dr. Lucky Cowboy and by Dr. Kathlene Cote (main vascular radiologist at [facility]) estimate 70-75% stenosis of right ICA. Recent carotid duplex  suggestive of high grade right carotid artery stenosis.   When last seen 10/24/2019 she was seen for cardiovascular clearance for right carotid endarterectomy.  She was staying active walking her dogs.  Echo 10/14/20 at outside facility with LV chamber size decreased, no RWMA, LVEF 29%, mild diastolic dysfunction, RV normal size, bilateral atria normal size, mild aortic valve sclerosis without stenosis, mild MR.   She presents today for follow up. Has been following closely with PCP for worsening COPD. Tells me she resumed her Spiriva this morning and is  tolerating well. Her breathing is improving. Did not tolerate Trelegy due to back pain. Since last seen she has discontinued Zetia and she reports it caused leg weakness and myalgias. We reviewed her echocardiogram and she was reassured by the result. Reports no chest pain, pressure, tightness. Denies edema, orthopnea, PND. Endorses following a heart healthy diet. Exercises by walking her dogs.   EKGs/Labs/Other Studies Reviewed:   The following studies were reviewed today:  02/2017 LHC  The left ventricular systolic function is normal.  LV end diastolic pressure is mildly elevated.  The left ventricular ejection fraction is 55-65% by visual estimate.  Prox RCA lesion, 95 %stenosed.  Mid RCA lesion, 100 %stenosed.  Ost 2nd Mrg to 2nd Mrg lesion, 50 %stenosed.   1. Severe one-vessel coronary artery disease with chronically occluded right coronary artery with well-developed left-to-right collaterals. Mild to moderate nonobstructive disease affecting the left system with extremely tortuous coronary arteries. 2. Normal LV systolic function and mildly elevated left ventricular end-diastolic pressure.   Recommendations: Medical therapy.  EKG:  EKG is ordered today.  The ekg ordered today demonstrates normal sinus rhythm 95 bpm with rightward axis and pulmonary disease pattern.  No acute ST/T wave changes.  Recent Labs: 11/07/2019: BUN 14; Creatinine, Ser 0.69; Hemoglobin 10.2; Platelets 160; Potassium 4.3; Sodium 130  Recent Lipid Panel    Component Value Date/Time   CHOL 253 (H) 01/07/2018 0858   TRIG 104 01/07/2018 0858   HDL 94 01/07/2018 0858   CHOLHDL 2.6 11/26/2015 0412   VLDL 6 11/26/2015 0412   LDLCALC 138 (H) 01/07/2018 0858    Home Medications   Current Meds  Medication Sig  . albuterol (VENTOLIN HFA) 108 (90 Base) MCG/ACT inhaler INHALE 2 PUFFS INTO THE LUNGS EVERY 6 HOURS AS NEEDED FOR WHEEZING ORSHORTNESS OF BREATH  . amLODipine (NORVASC) 2.5 MG tablet Take 1 tablet  (2.5 mg total) by mouth daily. Take along with 5 mg daily for total of 7.5 mg.  . amLODipine (NORVASC) 5 MG tablet Take 1 tablet (5 mg total) by mouth daily. Take along with 2.5 mg for a total of 7.5 mg.  . buPROPion (WELLBUTRIN) 100 MG tablet Take 100 mg by mouth daily.  . clopidogrel (PLAVIX) 75 MG tablet Take 1 tablet (75 mg total) by mouth daily.  . clotrimazole (MYCELEX) 10 MG troche DISSOLVE 1 TABLET BY MOUTH 5 TIMES DAILY  . ipratropium-albuterol (DUONEB) 0.5-2.5 (3) MG/3ML SOLN INHALE THE CONTENTS OF 1 VIAL (3 ML) VIANEBULIZER EVERY 4 HOURS  . LORazepam (ATIVAN) 0.5 MG tablet Take 1 tablet (0.5 mg total) by mouth at bedtime as needed. TAKE 1/2 TO 1 TABLET BY MOUTH AT NIGHT  . meloxicam (MOBIC) 15 MG tablet Take 1 tablet (15 mg total) by mouth daily.  . nitroGLYCERIN (NITROSTAT) 0.4 MG SL tablet Place 1 tablet (0.4 mg total) under the tongue every 5 (five) minutes as needed for chest pain.  Marland Kitchen nystatin (MYCOSTATIN) 100000 UNIT/ML suspension Take  5 mLs (500,000 Units total) by mouth 4 (four) times daily.  . Tiotropium Bromide Monohydrate (SPIRIVA RESPIMAT) 2.5 MCG/ACT AERS Inhale 1 puff into the lungs daily.     Review of Systems  All other systems reviewed and are otherwise negative except as noted above.  Physical Exam    VS:  BP (!) 130/56 (BP Location: Left Arm, Patient Position: Sitting, Cuff Size: Normal)   Pulse 95   Ht 5' (1.524 m)   Wt 76 lb (34.5 kg)   SpO2 92%   BMI 14.84 kg/m  , BMI Body mass index is 14.84 kg/m.  Wt Readings from Last 3 Encounters:  11/05/20 76 lb (34.5 kg)  09/30/20 77 lb 3.2 oz (35 kg)  09/14/20 76 lb 9.6 oz (34.7 kg)    GEN: Thin, well developed, in no acute distress. HEENT: normal. Neck: Supple, no JVD, carotid bruits, or masses. Cardiac: RRR, no murmurs, rubs, or gallops. No clubbing, cyanosis, edema.  Radials/PT 2+ and equal bilaterally.  Respiratory:  Respirations regular and unlabored, clear to auscultation bilaterally. GI: Soft,  nontender, nondistended. MS: No deformity or atrophy. Skin: Warm and dry, no rash. Neuro:  Strength and sensation are intact. Psych: Normal affect.  Assessment & Plan    1. Coronary artery disease- Stable with no anginal symptoms. No indication for ischemic evaluation at this time. GDMT includes Plavix. No aspirin/statin due to previous intolerance. Heart healthy diet and regular cardiovascular exercise encouraged.   2. Carotid to stenosis s/p right carotid endarterectomy- Bilateral 1 to 39% stenosis by duplex 09/03/2020.  Continue to follow with vascular surgery.  3. Hypertension -  BP reasonably well controlled. Continue current antihypertensive regimen. If BP becomes persistently >130/80, could consider increased dose of Amlodipine to 5mg  QD.  4. COPD - LIkely etiology of much of her dyspnea as recent echo 09/2020 with normal LVEF and only mild diastolic dysfunction with mild MR. Continue to follow with primary care.   5. HLD - Declined statins due to myalgias.  Intolerant to Zetia with myalgia.  Declines PCSK9. Upcoming lab work with PCP in July. Tells me her daughter is on a different cholesterol medication which she tolerates and encouraged her to discuss with PCP as they prescribe for her daughter.  6. PVD- 09/03/2020 right ABI 1.02 left ABI 0.95.  Continue to follow with vascular.  7. AAA- duplex 09/03/2020 AAA measured 3.26 stable compared to previous.  Continue to follow with vascular surgery.  Disposition: Follow up in 1 year(s) with Dr. Fletcher Anon or APP  Signed, Loel Dubonnet, NP 11/05/2020, 11:28 AM [place]

## 2020-11-05 NOTE — Patient Instructions (Addendum)
Medication Instructions:  Continue your current medications.  *If you need a refill on your cardiac medications before your next appointment, please call your pharmacy*  Lab Work: None ordered today.  If you find out what cholesterol medication your daughter is taking and want to try it, recommend discussing with Dr. Laurelyn Sickle office.  Testing/Procedures: Your EKG today was stable compared to previous.   Follow-Up: At [facility], you and your health needs are our priority.  As part of our continuing mission to provide you with exceptional heart care, we have created designated Provider Care Teams.  These Care Teams include your primary Cardiologist (physician) and Advanced Practice Providers (APPs -  Physician Assistants and Nurse Practitioners) who all work together to provide you with the care you need, when you need it.  We recommend signing up for the patient portal called "MyChart".  Sign up information is provided on this After Visit Summary.  MyChart is used to connect with patients for Virtual Visits (Telemedicine).  Patients are able to view lab/test results, encounter notes, upcoming appointments, etc.  Non-urgent messages can be sent to your provider as well.   To learn more about what you can do with MyChart, go to [email or web address].    Your next appointment:   1 year(s)  The format for your next appointment:   In Person  Provider:   You may see Kathlyn Sacramento, MD or one of the following Advanced Practice Providers on your designated Care Team:    Murray Hodgkins, NP  Christell Faith, PA-C  Marrianne Mood, PA-C  Cadence Kathlen Mody, [place]  Laurann Montana, NP  Other Instructions  Heart Healthy Diet Recommendations: A low-salt diet is recommended. Meats should be grilled, baked, or boiled. Avoid fried foods. Focus on lean protein sources like fish or chicken with vegetables and fruits. The American Heart Association is a Microbiologist!  American Heart Association  Diet and Lifeystyle Recommendations   Exercise recommendations: The American Heart Association recommends 150 minutes of moderate intensity exercise weekly. Try 30 minutes of moderate intensity exercise 4-5 times per week. This could include walking, jogging, or swimming.

## 2020-11-12 ENCOUNTER — Encounter: Payer: Self-pay | Admitting: Hospice and Palliative Medicine

## 2020-11-12 ENCOUNTER — Other Ambulatory Visit: Payer: Self-pay

## 2020-11-12 ENCOUNTER — Ambulatory Visit (INDEPENDENT_AMBULATORY_CARE_PROVIDER_SITE_OTHER): Payer: Medicare Other | Admitting: Hospice and Palliative Medicine

## 2020-11-12 VITALS — BP 138/72 | HR 77 | Temp 98.6°F | Resp 16 | Ht 60.0 in | Wt 75.8 lb

## 2020-11-12 DIAGNOSIS — J449 Chronic obstructive pulmonary disease, unspecified: Secondary | ICD-10-CM

## 2020-11-12 DIAGNOSIS — F411 Generalized anxiety disorder: Secondary | ICD-10-CM | POA: Diagnosis not present

## 2020-11-12 DIAGNOSIS — J439 Emphysema, unspecified: Secondary | ICD-10-CM

## 2020-11-12 DIAGNOSIS — R5383 Other fatigue: Secondary | ICD-10-CM | POA: Diagnosis not present

## 2020-11-12 DIAGNOSIS — I10 Essential (primary) hypertension: Secondary | ICD-10-CM | POA: Diagnosis not present

## 2020-11-12 MED ORDER — LORAZEPAM 0.5 MG PO TABS: 0.5000 mg | ORAL_TABLET | Freq: Every evening | ORAL | 1 refills | Status: DC | PRN

## 2020-11-12 MED ORDER — PREDNISONE 5 MG PO TABS: 5.0000 mg | ORAL_TABLET | Freq: Every day | ORAL | 0 refills | Status: DC | PRN

## 2020-11-12 MED ORDER — IPRATROPIUM-ALBUTEROL 0.5-2.5 (3) MG/3ML IN SOLN: RESPIRATORY_TRACT | 3 refills | Status: DC

## 2020-11-12 NOTE — Progress Notes (Signed)
[facility] [place], [place] [address]  Internal MEDICINE  Office Visit Note  Patient Name: Christina Hester  [id number]  [id number]  Date of Service: 11/17/2020  Chief Complaint  Patient presents with  . Anxiety  . Cancer  . COPD  . Hypertension  . Hyperlipidemia  . Follow-up    Med side effect    HPI Patient is here for routine follow-up Overall she is feeling well Has researched Daliresp and has decided she is not comfortable with possible side effects and is not wanting to take medication Breathing remains well controlled at this time  Last week had an episode of shortness of breath as well as wheezing, took half a tablet of prednisone she had left over and felt as though her symptoms resolved Requesting to have a script of prednisone to have in case of wheezing and shortness of breath Also has lorazepam she can take as needed but is afraid she will become addicted to medication  Current Medication: Outpatient Encounter Medications as of 11/12/2020  Medication Sig  . albuterol (VENTOLIN HFA) 108 (90 Base) MCG/ACT inhaler INHALE 2 PUFFS INTO THE LUNGS EVERY 6 HOURS AS NEEDED FOR WHEEZING ORSHORTNESS OF BREATH  . amLODipine (NORVASC) 2.5 MG tablet Take 1 tablet (2.5 mg total) by mouth daily. Take along with 5 mg daily for total of 7.5 mg.  . amLODipine (NORVASC) 5 MG tablet Take 1 tablet (5 mg total) by mouth daily. Take along with 2.5 mg for a total of 7.5 mg.  . buPROPion (WELLBUTRIN) 100 MG tablet Take 100 mg by mouth daily.  . clopidogrel (PLAVIX) 75 MG tablet Take 1 tablet (75 mg total) by mouth daily.  . clotrimazole (MYCELEX) 10 MG troche DISSOLVE 1 TABLET BY MOUTH 5 TIMES DAILY  . meloxicam (MOBIC) 15 MG tablet Take 1 tablet (15 mg total) by mouth daily.  . nitroGLYCERIN (NITROSTAT) 0.4 MG SL tablet Place 1 tablet (0.4 mg total) under the tongue every 5 (five) minutes as needed for chest pain.  Marland Kitchen nystatin (MYCOSTATIN) 100000 UNIT/ML  suspension Take 5 mLs (500,000 Units total) by mouth 4 (four) times daily.  . predniSONE (DELTASONE) 5 MG tablet Take 1 tablet (5 mg total) by mouth daily as needed.  . Tiotropium Bromide Monohydrate (SPIRIVA RESPIMAT) 2.5 MCG/ACT AERS Inhale 1 puff into the lungs daily.  . [DISCONTINUED] ipratropium-albuterol (DUONEB) 0.5-2.5 (3) MG/3ML SOLN INHALE THE CONTENTS OF 1 VIAL (3 ML) VIANEBULIZER EVERY 4 HOURS  . [DISCONTINUED] LORazepam (ATIVAN) 0.5 MG tablet Take 1 tablet (0.5 mg total) by mouth at bedtime as needed. TAKE 1/2 TO 1 TABLET BY MOUTH AT NIGHT  . [DISCONTINUED] Roflumilast (DALIRESP) 250 MCG TABS Take 250 mcg by mouth daily.  Marland Kitchen LORazepam (ATIVAN) 0.5 MG tablet Take 1 tablet (0.5 mg total) by mouth at bedtime as needed. TAKE 1/2 TO 1 TABLET BY MOUTH AT NIGHT   No facility-administered encounter medications on file as of 11/12/2020.    Surgical History: Past Surgical History:  Procedure Laterality Date  . ABDOMINAL HYSTERECTOMY    . APPENDECTOMY    . BREAST EXCISIONAL BIOPSY Left 80's or 90's   NEG  . CARDIAC CATHETERIZATION    . COLONOSCOPY N/A 05/22/2018   Procedure: COLONOSCOPY;  Surgeon: Lin Landsman, MD;  Location: [facility] ENDOSCOPY;  Service: Gastroenterology;  Laterality: N/A;  . COLONOSCOPY WITH PROPOFOL N/A 07/19/2020   Procedure: COLONOSCOPY WITH PROPOFOL;  Surgeon: Toledo, Benay Pike, MD;  Location: [facility] ENDOSCOPY;  Service: Gastroenterology;  Laterality: N/A;  . ENDARTERECTOMY Right 11/06/2019   Procedure: ENDARTERECTOMY CAROTID;  Surgeon: Algernon Huxley, MD;  Location: [facility] ORS;  Service: Vascular;  Laterality: Right;  . LEFT HEART CATH Bilateral 03/05/2017   Procedure: Left Heart Cath with poss PCI;  Surgeon: Wellington Hampshire, MD;  Location: [place] CV LAB;  Service: Cardiovascular;  Laterality: Bilateral;    Medical History: Past Medical History:  Diagnosis Date  . AAA (abdominal aortic aneurysm) ([place])    a. 05/2016 Abd U/S: 3.02 x 3.02 AAA.  Marland Kitchen Anxiety   .  CAD (coronary artery disease)    a. 02/2017 NSTEMI/Cath: LM nl, LAD mild dzs, D2 min irregs, LCX mild dzs, OM2 50ost, RCA 95p, 140m, L-->R collats, EF 55-65%.  Marland Kitchen COPD (chronic obstructive pulmonary disease) ([place])   . Diastolic dysfunction    a. 02/2017 Echo: EF 55-60%, gr1 DD, ? inf HK, mild MR.  Marland Kitchen Hx of basal cell carcinoma    back   . 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 Hyperlipidemia   . Hypertension   . PAD (peripheral artery disease) ([place])    a. 05/2016 ABI's: R 1.22, L 1.11.  . Tobacco abuse     Family History: Family History  Problem Relation Age of Onset  . Breast cancer Mother   . Dementia Mother   . Hypertension Mother   . Breast cancer Maternal Aunt   . Breast cancer Maternal Aunt   . Breast cancer Maternal Aunt   . CVA Father     Social History   Socioeconomic History  . Marital status: Divorced    Spouse name: Not on file  . Number of children: Not on file  . Years of education: Not on file  . Highest education level: Not on file  Occupational History  . Not on file  Tobacco Use  . Smoking status: Former Smoker    Packs/day: 0.25    Quit date: 07/12/2017    Years since quitting: 3.3  . Smokeless tobacco: Never Used  . Tobacco comment: currently 90 days without smoking  Vaping Use  . Vaping Use: Never used  Substance and Sexual Activity  . Alcohol use: No  . Drug use: No  . Sexual activity: Not on file  Other Topics Concern  . Not on file  Social History Narrative  . Not on file   Social Determinants of Health   Financial Resource Strain: Not on file  Food Insecurity: Not on file  Transportation Needs: Not on file  Physical Activity: Not on file  Stress: Not on file  Social Connections: Not on file  Intimate Partner Violence: Not on file      Review of Systems  Constitutional: Negative for chills, diaphoresis and fatigue.  HENT: Negative for ear pain, postnasal drip and sinus pressure.   Eyes: Negative  for photophobia, discharge, redness, itching and visual disturbance.  Respiratory: Negative for cough, shortness of breath and wheezing.   Cardiovascular: Negative for chest pain, palpitations and leg swelling.  Gastrointestinal: Negative for abdominal pain, constipation, diarrhea, nausea and vomiting.  Genitourinary: Negative for dysuria and flank pain.  Musculoskeletal: Negative for arthralgias, back pain, gait problem and neck pain.  Skin: Negative for color change.  Allergic/Immunologic: Negative for environmental allergies and food allergies.  Neurological: Negative for dizziness and headaches.  Hematological: Does not bruise/bleed easily.  Psychiatric/Behavioral: Negative for agitation, behavioral problems (depression) and hallucinations.    Vital Signs: BP 138/72  Pulse 77   Temp 98.6 F (37 C)   Resp 16   Ht 5' (1.524 m)   Wt 75 lb 12.8 oz (34.4 kg)   SpO2 93%   BMI 14.80 kg/m    Physical Exam Vitals reviewed.  Constitutional:      Appearance: Normal appearance. She is normal weight.  Cardiovascular:     Rate and Rhythm: Normal rate and regular rhythm.     Pulses: Normal pulses.     Heart sounds: Normal heart sounds.  Pulmonary:     Effort: Pulmonary effort is normal.     Breath sounds: Normal breath sounds.  Abdominal:     General: Abdomen is flat.     Palpations: Abdomen is soft.  Musculoskeletal:        General: Normal range of motion.     Cervical back: Normal range of motion.  Skin:    General: Skin is warm.  Neurological:     General: No focal deficit present.     Mental Status: She is alert and oriented to person, place, and time. Mental status is at baseline.  Psychiatric:        Mood and Affect: Mood normal.        Behavior: Behavior normal.        Thought Content: Thought content normal.        Judgment: Judgment normal.    Assessment/Plan: 1. COPD with chronic bronchitis and emphysema ([place]) Continue with all supportive measures May use  prednisone as needed - predniSONE (DELTASONE) 5 MG tablet; Take 1 tablet (5 mg total) by mouth daily as needed.  Dispense: 30 tablet; Refill: 0  2. Essential hypertension BP and HR remain well controlled, continue to monitor  3. Generalized anxiety disorder May continue lorazepam as needed for panic related to shortness of breath [place] Controlled Substance Database was reviewed by me for overdose risk score (ORS) Reviewed risks and possible side effects associated with taking opiates, benzodiazepines and other CNS depressants. Combination of these could cause dizziness and drowsiness. Advised patient not to drive or operate machinery when taking these medications, as patient's and other's life can be at risk and will have consequences. Patient verbalized understanding in this matter. Dependence and abuse for these drugs will be monitored closely. A Controlled substance policy and procedure is on file which allows [facility] to order a urine drug screen test at any visit. Patient understands and agrees with the plan - LORazepam (ATIVAN) 0.5 MG tablet; Take 1 tablet (0.5 mg total) by mouth at bedtime as needed. TAKE 1/2 TO 1 TABLET BY MOUTH AT NIGHT  Dispense: 30 tablet; Refill: 1  4. Other fatigue - CBC w/Diff/Platelet - Comprehensive Metabolic Panel (CMET) - Lipid Panel With LDL/HDL Ratio - TSH + free T4  General Counseling: Christina Hester verbalizes understanding of the findings of todays visit and agrees with plan of treatment. I have discussed any further diagnostic evaluation that may be needed or ordered today. We also reviewed her medications today. she has been encouraged to call the office with any questions or concerns that should arise related to todays visit.    Orders Placed This Encounter  Procedures  . CBC w/Diff/Platelet  . Comprehensive Metabolic Panel (CMET)  . Lipid Panel With LDL/HDL Ratio  . TSH + free T4    Meds ordered this encounter  Medications  . predniSONE  (DELTASONE) 5 MG tablet    Sig: Take 1 tablet (5 mg total) by mouth daily as needed.  Dispense:  30 tablet    Refill:  0  . LORazepam (ATIVAN) 0.5 MG tablet    Sig: Take 1 tablet (0.5 mg total) by mouth at bedtime as needed. TAKE 1/2 TO 1 TABLET BY MOUTH AT NIGHT    Dispense:  30 tablet    Refill:  1    Time spent: 30 Minutes Time spent includes review of chart, medications, test results and follow-up plan with the patient.  This patient was seen by Theodoro Grist AGNP-C in Collaboration with Dr Lavera Guise as a part of collaborative care agreement     Tanna Furry. Fujiko Picazo AGNP-C Internal medicine

## 2020-11-17 ENCOUNTER — Encounter: Payer: Self-pay | Admitting: Hospice and Palliative Medicine

## 2021-01-11 ENCOUNTER — Ambulatory Visit: Payer: Medicare Other | Admitting: Internal Medicine

## 2021-01-12 ENCOUNTER — Other Ambulatory Visit: Payer: Self-pay

## 2021-01-12 ENCOUNTER — Emergency Department
Admission: EM | Admit: 2021-01-12 | Discharge: 2021-01-12 | Disposition: A | Discharge status: Home / Self Care | Payer: Medicare Other | Attending: Emergency Medicine | Admitting: Emergency Medicine

## 2021-01-12 ENCOUNTER — Emergency Department: Payer: Medicare Other

## 2021-01-12 DIAGNOSIS — I2510 Atherosclerotic heart disease of native coronary artery without angina pectoris: Secondary | ICD-10-CM | POA: Diagnosis not present

## 2021-01-12 DIAGNOSIS — Z85828 Personal history of other malignant neoplasm of skin: Secondary | ICD-10-CM | POA: Diagnosis not present

## 2021-01-12 DIAGNOSIS — M5431 Sciatica, right side: Secondary | ICD-10-CM | POA: Diagnosis not present

## 2021-01-12 DIAGNOSIS — Z87891 Personal history of nicotine dependence: Secondary | ICD-10-CM | POA: Insufficient documentation

## 2021-01-12 DIAGNOSIS — I10 Essential (primary) hypertension: Secondary | ICD-10-CM | POA: Insufficient documentation

## 2021-01-12 DIAGNOSIS — J441 Chronic obstructive pulmonary disease with (acute) exacerbation: Secondary | ICD-10-CM | POA: Diagnosis not present

## 2021-01-12 DIAGNOSIS — D72829 Elevated white blood cell count, unspecified: Secondary | ICD-10-CM | POA: Diagnosis not present

## 2021-01-12 DIAGNOSIS — M1611 Unilateral primary osteoarthritis, right hip: Secondary | ICD-10-CM | POA: Diagnosis not present

## 2021-01-12 DIAGNOSIS — M810 Age-related osteoporosis without current pathological fracture: Secondary | ICD-10-CM | POA: Diagnosis not present

## 2021-01-12 DIAGNOSIS — M5441 Lumbago with sciatica, right side: Secondary | ICD-10-CM | POA: Diagnosis not present

## 2021-01-12 DIAGNOSIS — Z7902 Long term (current) use of antithrombotics/antiplatelets: Secondary | ICD-10-CM | POA: Diagnosis not present

## 2021-01-12 DIAGNOSIS — Z79899 Other long term (current) drug therapy: Secondary | ICD-10-CM | POA: Insufficient documentation

## 2021-01-12 DIAGNOSIS — Z7951 Long term (current) use of inhaled steroids: Secondary | ICD-10-CM | POA: Diagnosis not present

## 2021-01-12 DIAGNOSIS — M25551 Pain in right hip: Secondary | ICD-10-CM | POA: Diagnosis not present

## 2021-01-12 MED ORDER — METHYLPREDNISOLONE 4 MG PO TBPK: 8.0000 mg | ORAL_TABLET | Freq: Every evening | ORAL | Status: DC

## 2021-01-12 MED ORDER — METHYLPREDNISOLONE 4 MG PO TBPK: 4.0000 mg | ORAL_TABLET | ORAL | Status: DC

## 2021-01-12 MED ORDER — METHYLPREDNISOLONE 4 MG PO TBPK: 4.0000 mg | ORAL_TABLET | Freq: Four times a day (QID) | ORAL | Status: DC

## 2021-01-12 MED ORDER — PREDNISONE 10 MG PO TABS: 50.0000 mg | ORAL_TABLET | Freq: Every day | ORAL | 0 refills | Status: DC

## 2021-01-12 MED ORDER — TRAMADOL HCL 50 MG PO TABS: 50.0000 mg | ORAL_TABLET | Freq: Four times a day (QID) | ORAL | 0 refills | Status: DC | PRN

## 2021-01-12 MED ORDER — METHYLPREDNISOLONE 4 MG PO TBPK: 8.0000 mg | ORAL_TABLET | Freq: Every morning | ORAL | Status: DC

## 2021-01-12 MED ORDER — PREDNISONE 20 MG PO TABS
50.0000 mg | ORAL_TABLET | Freq: Once | ORAL | Status: AC
  Administered 2021-01-12: 50 mg via ORAL
  Filled 2021-01-12: qty 3

## 2021-01-12 MED ORDER — OXYCODONE HCL 5 MG PO TABS
5.0000 mg | ORAL_TABLET | Freq: Once | ORAL | Status: AC
  Administered 2021-01-12: 5 mg via ORAL
  Filled 2021-01-12: qty 1

## 2021-01-12 MED ORDER — METHYLPREDNISOLONE 4 MG PO TBPK: 4.0000 mg | ORAL_TABLET | Freq: Three times a day (TID) | ORAL | Status: DC

## 2021-01-12 NOTE — ED Provider Notes (Signed)
[facility] Emergency Department Provider Note ____________________________________________   Event Date/Time   First MD Initiated Contact with Patient 01/12/21 2017     (approximate)  I have reviewed the triage vital signs and the nursing notes.   HISTORY  Chief Complaint Hip Pain  HPI Christina Hester is a 80 y.o. female with history of COPD, anxiety, hypertension, and remaining history as listed below presents to the emergency department for treatment and evaluation treatment and evaluation of right hip pain. No specific injury, but after walking her dogs today she came inside, sat down and had some pain in the right lower back/hip area and after some time got up to walk to the bathroom and pain started radiating down the back of her leg and across her groin. No relief with Tylenol. No pain similar to this in the past.     Past Medical History:  Diagnosis Date   AAA (abdominal aortic aneurysm) ([place])    a. 05/2016 Abd U/S: 3.02 x 3.02 AAA.   Anxiety    CAD (coronary artery disease)    a. 02/2017 NSTEMI/Cath: LM nl, LAD mild dzs, D2 min irregs, LCX mild dzs, OM2 50ost, RCA 95p, 159m, L-->R collats, EF 55-65%.   COPD (chronic obstructive pulmonary disease) (HCC)    Diastolic dysfunction    a. 02/2017 Echo: EF 55-60%, gr1 DD, ? inf HK, mild MR.   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 05/2016 ABI's: R 1.22, L 1.11.   Tobacco abuse     Patient Active Problem List   Diagnosis Date Noted   Preprocedural examination 07/15/2020   Thrush 01/18/2020   Dysuria 01/18/2020   Hospital discharge follow-up 11/10/2019   S/P carotid endarterectomy 11/10/2019   Cough 11/10/2019   Carotid stenosis, right 11/06/2019   Costochondral pain 09/10/2019   Wheezing 06/20/2019   Encounter for screening mammogram for malignant  neoplasm of breast 05/25/2019   Need for vaccination against Streptococcus pneumoniae using pneumococcal conjugate vaccine 13 05/25/2019   Encounter for general adult medical examination with abnormal findings 01/08/2019   Generalized anxiety disorder 07/30/2018   Lower GI bleed 05/21/2018   COPD with chronic bronchitis and emphysema ([place]) 07/25/2017   Essential hypertension 07/25/2017   Carotid stenosis 05/18/2017   Coronary artery disease 03/06/2017   S/P cardiac cath 03/06/2017   Unstable angina ([place]) 03/04/2017   Demand ischemia ([place]) 03/04/2017   COPD with acute exacerbation ([place]) 03/03/2017   Left sided chest pain 03/03/2017   Multifocal pneumonia 03/03/2017   Hyponatremia 03/03/2017   Dehydration 03/03/2017   Leukocytosis 03/03/2017   Acute on chronic respiratory failure ([place]) 03/02/2017   Pneumonia 07/18/2016   AAA (abdominal aortic aneurysm) without rupture ([place]) 05/17/2016   PVD (peripheral vascular disease) ([place]) 05/17/2016   Protein-calorie malnutrition, severe 04/29/2016   Community acquired pneumonia 04/28/2016   Chest pain 11/25/2015    Past Surgical History:  Procedure Laterality Date   ABDOMINAL HYSTERECTOMY     APPENDECTOMY     BREAST EXCISIONAL BIOPSY Left 80's or 90's   NEG   CARDIAC CATHETERIZATION     COLONOSCOPY N/A 05/22/2018   Procedure: COLONOSCOPY;  Surgeon: Lin Landsman, MD;  Location: [facility] ENDOSCOPY;  Service: Gastroenterology;  Laterality: N/A;   COLONOSCOPY WITH PROPOFOL N/A 07/19/2020  Procedure: COLONOSCOPY WITH PROPOFOL;  Surgeon: Toledo, Benay Pike, MD;  Location: [facility] ENDOSCOPY;  Service: Gastroenterology;  Laterality: N/A;   ENDARTERECTOMY Right 11/06/2019   Procedure: ENDARTERECTOMY CAROTID;  Surgeon: Algernon Huxley, MD;  Location: [facility] ORS;  Service: Vascular;  Laterality: Right;   LEFT HEART CATH Bilateral 03/05/2017   Procedure: Left Heart Cath with poss PCI;  Surgeon: Wellington Hampshire, MD;  Location: [facility];  Service:  Cardiovascular;  Laterality: Bilateral;    Prior to Admission medications   Medication Sig Start Date End Date Taking? Authorizing Provider  predniSONE (DELTASONE) 10 MG tablet Take 5 tablets (50 mg total) by mouth daily. 01/12/21  Yes Brayah Urquilla B, FNP  traMADol (ULTRAM) 50 MG tablet Take 1 tablet (50 mg total) by mouth every 6 (six) hours as needed. 01/12/21  Yes Askia Hazelip B, FNP  albuterol (VENTOLIN HFA) 108 (90 Base) MCG/ACT inhaler INHALE 2 PUFFS INTO THE LUNGS EVERY 6 HOURS AS NEEDED FOR WHEEZING ORSHORTNESS OF BREATH 01/15/20   Ronnell Freshwater, NP  amLODipine (NORVASC) 2.5 MG tablet Take 1 tablet (2.5 mg total) by mouth daily. Take along with 5 mg daily for total of 7.5 mg. 06/09/20   Luiz Ochoa, NP  amLODipine (NORVASC) 5 MG tablet Take 1 tablet (5 mg total) by mouth daily. Take along with 2.5 mg for a total of 7.5 mg. 06/09/20   Luiz Ochoa, NP  buPROPion (WELLBUTRIN) 100 MG tablet Take 100 mg by mouth daily.    [provider]  clopidogrel (PLAVIX) 75 MG tablet Take 1 tablet (75 mg total) by mouth daily. 06/09/20   Luiz Ochoa, NP  clotrimazole (MYCELEX) 10 MG troche DISSOLVE 1 TABLET BY MOUTH 5 TIMES DAILY 12/13/18   Ronnell Freshwater, NP  ipratropium-albuterol (DUONEB) 0.5-2.5 (3) MG/3ML SOLN INHALE THE CONTENTS OF 1 VIAL (3 ML) VIANEBULIZER EVERY 4 HOURS 11/12/20   Luiz Ochoa, NP  LORazepam (ATIVAN) 0.5 MG tablet Take 1 tablet (0.5 mg total) by mouth at bedtime as needed. TAKE 1/2 TO 1 TABLET BY MOUTH AT NIGHT 11/12/20   Luiz Ochoa, NP  meloxicam (MOBIC) 15 MG tablet Take 1 tablet (15 mg total) by mouth daily. 06/09/20   Luiz Ochoa, NP  nitroGLYCERIN (NITROSTAT) 0.4 MG SL tablet Place 1 tablet (0.4 mg total) under the tongue every 5 (five) minutes as needed for chest pain. 03/06/17   Theodoro Grist, MD  nystatin (MYCOSTATIN) 100000 UNIT/ML suspension Take 5 mLs (500,000 Units total) by mouth 4 (four) times daily. 01/09/20   Ronnell Freshwater,  NP  Tiotropium Bromide Monohydrate (SPIRIVA RESPIMAT) 2.5 MCG/ACT AERS Inhale 1 puff into the lungs daily. 12/27/17   Lavera Guise, MD    Allergies Aspirin, Prednisone, Sulfa antibiotics, and Symbicort [budesonide-formoterol fumarate]  Family History  Problem Relation Age of Onset   Breast cancer Mother    Dementia Mother    Hypertension Mother    Breast cancer Maternal Aunt    Breast cancer Maternal Aunt    Breast cancer Maternal Aunt    CVA Father     Social History Social History   Tobacco Use   Smoking status: Former    Packs/day: 0.25    Pack years: 0.00    Types: Cigarettes    Quit date: 07/12/2017    Years since quitting: 3.5   Smokeless tobacco: Never   Tobacco comments:    currently 90 days without smoking  Vaping Use   Vaping  Use: Never used  Substance Use Topics   Alcohol use: No   Drug use: No    Review of Systems  Constitutional: No fever/chills Eyes: No visual changes. ENT: No sore throat. Cardiovascular: Denies chest pain. Respiratory: Denies shortness of breath. Gastrointestinal: No abdominal pain.  No nausea, no vomiting.  No diarrhea.  No constipation. Genitourinary: Negative for dysuria. Musculoskeletal: Negative for back pain. Skin: Negative for rash. Neurological: Negative for headaches, focal weakness or numbness.  ____________________________________________   PHYSICAL EXAM:  VITAL SIGNS: ED Triage Vitals  Enc Vitals Group     BP 01/12/21 1937 (!) 172/70     Pulse Rate 01/12/21 1937 (!) 102     Resp 01/12/21 1937 18     Temp 01/12/21 1937 98.3 F (36.8 C)     Temp Source 01/12/21 1937 Oral     SpO2 01/12/21 1937 98 %     Weight 01/12/21 1938 74 lb (33.6 kg)     Height 01/12/21 1938 5' (1.524 m)     Head Circumference --      Peak Flow --      Pain Score 01/12/21 1937 9     Pain Loc --      Pain Edu? --      Excl. in [place]? --     Constitutional: Alert and oriented. Well appearing and in no acute distress. Eyes:  Conjunctivae are normal. PERRL. EOMI. Head: Atraumatic. Nose: No congestion/rhinnorhea. Mouth/Throat: Mucous membranes are moist.  Oropharynx non-erythematous. Neck: No stridor.   Hematological/Lymphatic/Immunilogical: No cervical lymphadenopathy. Cardiovascular: Normal rate, regular rhythm. Grossly normal heart sounds.  Good peripheral circulation. Respiratory: Normal respiratory effort.  No retractions. Lungs CTAB. Gastrointestinal: Soft and nontender. No distention. No abdominal bruits. No CVA tenderness. Genitourinary:  Musculoskeletal: No lower extremity tenderness nor edema.  No joint effusions. Neurologic:  Normal speech and language. No gross focal neurologic deficits are appreciated. No gait instability. Skin:  Skin is warm, dry and intact. No rash noted. Psychiatric: Mood and affect are normal. Speech and behavior are normal.  ____________________________________________   LABS (all labs ordered are listed, but only abnormal results are displayed)  Labs Reviewed - No data to display ____________________________________________  EKG  Not indicated. ____________________________________________  RADIOLOGY  ED MD interpretation:    No acute concerns on x-ray or right hip.  I, Sherrie George, personally viewed and evaluated these images (plain radiographs) as part of my medical decision making, as well as reviewing the written report by the radiologist.  Official radiology report(s): CT HIP RIGHT WO CONTRAST  Result Date: 01/12/2021 CLINICAL DATA:  Hip pain which began after walking dog, no fall or other reported injury EXAM: CT OF THE RIGHT HIP WITHOUT CONTRAST TECHNIQUE: Multidetector CT imaging of the right hip was performed according to the standard protocol. Multiplanar CT image reconstructions were also generated. COMPARISON:  Radiograph 01/12/2021, CT 05/21/2018 FINDINGS: Bones/Joint/Cartilage Diffuse bony demineralization. No acute fracture or traumatic osseous  injury is seen of the included portions of the bony pelvis, sacrum or proximal right femur. Femoral head is normally located. Minimal discogenic and facet degenerative changes in lower lumbar spine. Mild arthrosis at the symphysis pubis in right SI joint. Periarticular spurring about the acetabulum compatible with mild arthrosis of the hip. No suspicious lytic or blastic lesions. No sizable hip effusion. Ligaments Suboptimally assessed by CT. Muscles and Tendons No clearly retracted or torn tendons. No intramuscular collection or abscess. Soft tissues No significant soft tissue swelling, gas or foreign body. Aortoiliac atherosclerosis.  Moderate colonic stool burden. Bladder is distended with small bladder ear projecting towards the right inguinal canal. IMPRESSION: No acute fracture or traumatic osseous injury. Diffuse bony demineralization. Mild degenerative changes in the spine, hips and pelvis. Aortic Atherosclerosis (ICD10-I70.0). Bladder distension, correlate for urinary symptoms. Electronically Signed   By: Lovena Le M.D.   On: 01/12/2021 21:36   DG Hip Unilat  With Pelvis 2-3 Views Right  Result Date: 01/12/2021 CLINICAL DATA:  Right hip pain EXAM: DG HIP (WITH OR WITHOUT PELVIS) 2-3V RIGHT COMPARISON:  07/07/2019 FINDINGS: No acute bony abnormality. Specifically, no fracture, subluxation, or dislocation. Hip joints and SI joints symmetric and unremarkable. IMPRESSION: Negative. Electronically Signed   By: Rolm Baptise M.D.   On: 01/12/2021 19:56    ____________________________________________   PROCEDURES  Procedure(s) performed (including Critical Care):  Procedures  ____________________________________________   INITIAL IMPRESSION / ASSESSMENT AND PLAN     80 year old female presents to the emergency department for treatment and evaluation of non-traumatic right hip pain. See HPI  DIFFERENTIAL DIAGNOSIS  Hip fracture, tendonitis, sciatica  ED COURSE  No acute concerns on  imaging. Will treat with prednisone and tramadol. She is to follow up with orthopedics for symptoms of concern.    ___________________________________________   FINAL CLINICAL IMPRESSION(S) / ED DIAGNOSES  Final diagnoses:  Right hip pain  Sciatica of right side     ED Discharge Orders          Ordered    traMADol (ULTRAM) 50 MG tablet  Every 6 hours PRN        01/12/21 2309    predniSONE (DELTASONE) 10 MG tablet  Daily        01/12/21 2703             Christina Hester was evaluated in Emergency Department on 01/13/2021 for the symptoms described in the history of present illness. She was evaluated in the context of the global COVID-19 pandemic, which necessitated consideration that the patient might be at risk for infection with the SARS-CoV-2 virus that causes COVID-19. Institutional protocols and algorithms that pertain to the evaluation of patients at risk for COVID-19 are in a state of rapid change based on information released by regulatory bodies including the CDC and federal and state organizations. These policies and algorithms were followed during the patient's care in the ED.   Note:  This document was prepared using Dragon voice recognition software and may include unintentional dictation errors.    Victorino Dike, FNP 01/13/21 Jen Mow    Carrie Mew, MD 01/13/21 [phone]

## 2021-01-12 NOTE — ED Triage Notes (Signed)
Pt has right hip pain.  No known injury.  Sx began at noon today after walking the dog.  Pt did not fall today.  Pt alert  speech clear.

## 2021-01-18 ENCOUNTER — Encounter: Payer: Self-pay | Admitting: Nurse Practitioner

## 2021-01-18 ENCOUNTER — Other Ambulatory Visit: Payer: Self-pay

## 2021-01-18 ENCOUNTER — Ambulatory Visit (INDEPENDENT_AMBULATORY_CARE_PROVIDER_SITE_OTHER): Payer: Medicare Other | Admitting: Nurse Practitioner

## 2021-01-18 VITALS — BP 128/72 | HR 105 | Temp 98.8°F | Resp 16 | Ht 60.0 in | Wt 76.0 lb

## 2021-01-18 DIAGNOSIS — I6523 Occlusion and stenosis of bilateral carotid arteries: Secondary | ICD-10-CM | POA: Diagnosis not present

## 2021-01-18 DIAGNOSIS — I200 Unstable angina: Secondary | ICD-10-CM

## 2021-01-18 DIAGNOSIS — Z20822 Contact with and (suspected) exposure to covid-19: Secondary | ICD-10-CM | POA: Diagnosis not present

## 2021-01-18 DIAGNOSIS — M2550 Pain in unspecified joint: Secondary | ICD-10-CM | POA: Diagnosis not present

## 2021-01-18 DIAGNOSIS — Z0001 Encounter for general adult medical examination with abnormal findings: Secondary | ICD-10-CM

## 2021-01-18 DIAGNOSIS — I10 Essential (primary) hypertension: Secondary | ICD-10-CM | POA: Diagnosis not present

## 2021-01-18 DIAGNOSIS — R300 Dysuria: Secondary | ICD-10-CM

## 2021-01-18 DIAGNOSIS — F411 Generalized anxiety disorder: Secondary | ICD-10-CM | POA: Diagnosis not present

## 2021-01-18 DIAGNOSIS — J449 Chronic obstructive pulmonary disease, unspecified: Secondary | ICD-10-CM | POA: Diagnosis not present

## 2021-01-18 MED ORDER — AMLODIPINE BESYLATE 2.5 MG PO TABS: 2.5000 mg | ORAL_TABLET | Freq: Every day | ORAL | 3 refills | Status: DC

## 2021-01-18 MED ORDER — BUPROPION HCL 100 MG PO TABS: 100.0000 mg | ORAL_TABLET | Freq: Every day | ORAL | 1 refills | Status: DC

## 2021-01-18 MED ORDER — IPRATROPIUM-ALBUTEROL 0.5-2.5 (3) MG/3ML IN SOLN: RESPIRATORY_TRACT | 3 refills | Status: DC

## 2021-01-18 MED ORDER — CLOPIDOGREL BISULFATE 75 MG PO TABS: 75.0000 mg | ORAL_TABLET | Freq: Every day | ORAL | 3 refills | Status: DC

## 2021-01-18 MED ORDER — AMLODIPINE BESYLATE 5 MG PO TABS: 5.0000 mg | ORAL_TABLET | Freq: Every day | ORAL | 3 refills | Status: DC

## 2021-01-18 MED ORDER — NITROGLYCERIN 0.4 MG SL SUBL: 0.4000 mg | SUBLINGUAL_TABLET | SUBLINGUAL | 12 refills | Status: DC | PRN

## 2021-01-18 MED ORDER — SPIRIVA RESPIMAT 2.5 MCG/ACT IN AERS: 1.0000 | INHALATION_SPRAY | Freq: Every day | RESPIRATORY_TRACT | 5 refills | Status: DC

## 2021-01-18 MED ORDER — LORAZEPAM 0.5 MG PO TABS: 0.5000 mg | ORAL_TABLET | Freq: Every evening | ORAL | 1 refills | Status: DC | PRN

## 2021-01-18 MED ORDER — MELOXICAM 15 MG PO TABS: 15.0000 mg | ORAL_TABLET | Freq: Every day | ORAL | 0 refills | Status: DC

## 2021-01-18 NOTE — Progress Notes (Signed)
[facility] [place], [place] [address]  Internal MEDICINE  Office Visit Note  Patient Name: Christina Hester  [id number]  [id number]  Date of Service: 01/18/2021  Chief Complaint  Patient presents with   Annual Exam    Ultrasounds results    HPI Christina Hester presents for an annual well visit and physical exam. she has a history of anxiety, COPD, hyperlipidemia, hypertension, AAA, history of skin cancer, CAD, MI, PAD, tobacco use. Surgical history of right endarterectomy, hysterectomy, appendectomy, and cardiac cath x2. She received 2 doses of the covid vaccine and 1 booster dose. She declined the 4th covid booster, shingles vaccine and the tetanus booster. She declined a clinical breast exam today. She has aged out of mammograms and stated she would call for an appointment if she ever felt a lump or if anything changes. She is retired, lives at home alone. She denies any pain.  She has no other questions or concerns.  -Echocardiogram reviewed with patient. Normal LVEF of 61%. Mild mitral regurgitation and other minor age related changes. Recommendation to repeat echo in 3 years to reassess MR. Blood pressure well controlled.  Current Medication: Outpatient Encounter Medications as of 01/18/2021  Medication Sig   albuterol (VENTOLIN HFA) 108 (90 Base) MCG/ACT inhaler INHALE 2 PUFFS INTO THE LUNGS EVERY 6 HOURS AS NEEDED FOR WHEEZING ORSHORTNESS OF BREATH   clotrimazole (MYCELEX) 10 MG troche DISSOLVE 1 TABLET BY MOUTH 5 TIMES DAILY   nystatin (MYCOSTATIN) 100000 UNIT/ML suspension Take 5 mLs (500,000 Units total) by mouth 4 (four) times daily.   [DISCONTINUED] amLODipine (NORVASC) 2.5 MG tablet Take 1 tablet (2.5 mg total) by mouth daily. Take along with 5 mg daily for total of 7.5 mg.   [DISCONTINUED] amLODipine (NORVASC) 5 MG tablet Take 1 tablet (5 mg total) by mouth daily. Take along with 2.5 mg for a total of 7.5 mg.   [DISCONTINUED] buPROPion (WELLBUTRIN) 100 MG  tablet Take 100 mg by mouth daily.   [DISCONTINUED] clopidogrel (PLAVIX) 75 MG tablet Take 1 tablet (75 mg total) by mouth daily.   [DISCONTINUED] ipratropium-albuterol (DUONEB) 0.5-2.5 (3) MG/3ML SOLN INHALE THE CONTENTS OF 1 VIAL (3 ML) VIANEBULIZER EVERY 4 HOURS   [DISCONTINUED] LORazepam (ATIVAN) 0.5 MG tablet Take 1 tablet (0.5 mg total) by mouth at bedtime as needed. TAKE 1/2 TO 1 TABLET BY MOUTH AT NIGHT   [DISCONTINUED] meloxicam (MOBIC) 15 MG tablet Take 1 tablet (15 mg total) by mouth daily.   [DISCONTINUED] nitroGLYCERIN (NITROSTAT) 0.4 MG SL tablet Place 1 tablet (0.4 mg total) under the tongue every 5 (five) minutes as needed for chest pain.   [DISCONTINUED] predniSONE (DELTASONE) 10 MG tablet Take 5 tablets (50 mg total) by mouth daily.   [DISCONTINUED] Tiotropium Bromide Monohydrate (SPIRIVA RESPIMAT) 2.5 MCG/ACT AERS Inhale 1 puff into the lungs daily.   amLODipine (NORVASC) 2.5 MG tablet Take 1 tablet (2.5 mg total) by mouth daily. Take along with 5 mg daily for total of 7.5 mg.   amLODipine (NORVASC) 5 MG tablet Take 1 tablet (5 mg total) by mouth daily. Take along with 2.5 mg for a total of 7.5 mg.   buPROPion (WELLBUTRIN) 100 MG tablet Take 1 tablet (100 mg total) by mouth daily.   clopidogrel (PLAVIX) 75 MG tablet Take 1 tablet (75 mg total) by mouth daily.   ipratropium-albuterol (DUONEB) 0.5-2.5 (3) MG/3ML SOLN INHALE THE CONTENTS OF 1 VIAL (3 ML) VIANEBULIZER EVERY 4 HOURS   LORazepam (ATIVAN) 0.5 MG tablet  Take 1 tablet (0.5 mg total) by mouth at bedtime as needed. TAKE 1/2 TO 1 TABLET BY MOUTH AT NIGHT   meloxicam (MOBIC) 15 MG tablet Take 1 tablet (15 mg total) by mouth daily.   nitroGLYCERIN (NITROSTAT) 0.4 MG SL tablet Place 1 tablet (0.4 mg total) under the tongue every 5 (five) minutes as needed for chest pain.   Tiotropium Bromide Monohydrate (SPIRIVA RESPIMAT) 2.5 MCG/ACT AERS Inhale 1 puff into the lungs daily.   [DISCONTINUED] traMADol (ULTRAM) 50 MG tablet Take 1  tablet (50 mg total) by mouth every 6 (six) hours as needed. (Patient not taking: Reported on 01/18/2021)   No facility-administered encounter medications on file as of 01/18/2021.    Surgical History: Past Surgical History:  Procedure Laterality Date   ABDOMINAL HYSTERECTOMY     APPENDECTOMY     BREAST EXCISIONAL BIOPSY Left 80's or 90's   NEG   CARDIAC CATHETERIZATION     COLONOSCOPY N/A 05/22/2018   Procedure: COLONOSCOPY;  Surgeon: Lin Landsman, MD;  Location: [facility];  Service: Gastroenterology;  Laterality: N/A;   COLONOSCOPY WITH PROPOFOL N/A 07/19/2020   Procedure: COLONOSCOPY WITH PROPOFOL;  Surgeon: Toledo, Benay Pike, MD;  Location: [facility];  Service: Gastroenterology;  Laterality: N/A;   ENDARTERECTOMY Right 11/06/2019   Procedure: ENDARTERECTOMY CAROTID;  Surgeon: Algernon Huxley, MD;  Location: [facility] OR[facility];  Service: Vascular;  Laterality: Right;   LEFT HEART CATH Bilateral 03/05/2017   Procedure: Left Heart Cath with poss PCI;  Surgeon: Wellington Hampshire, MD;  Location: [facility];  Service: Cardiovascular;  Laterality: Bilateral;    Medical History: Past Medical History:  Diagnosis Date   AAA (abdominal aortic aneurysm) ([place])    a. 05/2016 Abd U/S: 3.02 x 3.02 AAA.   Anxiety    CAD (coronary artery disease)    a. 02/2017 NSTEMI/Cath: LM nl, LAD mild dzs, D2 min irregs, LCX mild dzs, OM2 50ost, RCA 95p, 128m, L-->R collats, EF 55-65%.   COPD (chronic obstructive pulmonary disease) (HCC)    Diastolic dysfunction    a. 02/2017 Echo: EF 55-60%, gr1 DD, ? inf HK, mild MR.   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 05/2016 ABI's: R 1.22, L 1.11.   Tobacco abuse     Family History: Family History  Problem Relation Age of Onset   Breast cancer Mother    Dementia Mother    Hypertension Mother    Breast  cancer Maternal Aunt    Breast cancer Maternal Aunt    Breast cancer Maternal Aunt    CVA Father     Social History   Socioeconomic History   Marital status: Divorced    Spouse name: Not on file   Number of children: Not on file   Years of education: Not on file   Highest education level: Not on file  Occupational History   Not on file  Tobacco Use   Smoking status: Former    Packs/day: 0.25    Pack years: 0.00    Types: Cigarettes    Quit date: 07/12/2017    Years since quitting: 3.5   Smokeless tobacco: Never   Tobacco comments:    currently 90 days without smoking  Vaping Use   Vaping Use: Never used  Substance and  Sexual Activity   Alcohol use: No   Drug use: No   Sexual activity: Not on file  Other Topics Concern   Not on file  Social History Narrative   Not on file   Social Determinants of Health   Financial Resource Strain: Not on file  Food Insecurity: Not on file  Transportation Needs: Not on file  Physical Activity: Not on file  Stress: Not on file  Social Connections: Not on file  Intimate Partner Violence: Not on file      Review of Systems  Constitutional:  Negative for activity change, appetite change, chills, fatigue, fever and unexpected weight change.  HENT: Negative.  Negative for congestion, ear pain, rhinorrhea, sore throat and trouble swallowing.   Eyes: Negative.   Respiratory: Negative.  Negative for cough, chest tightness, shortness of breath and wheezing.   Cardiovascular: Negative.  Negative for chest pain.  Gastrointestinal: Negative.  Negative for abdominal pain, blood in stool, constipation, diarrhea, nausea and vomiting.  Endocrine: Negative.   Genitourinary: Negative.  Negative for difficulty urinating, dysuria, frequency, hematuria and urgency.  Musculoskeletal: Negative.  Negative for arthralgias, back pain, joint swelling, myalgias and neck pain.  Skin: Negative.  Negative for rash and wound.  Allergic/Immunologic:  Negative.  Negative for immunocompromised state.  Neurological: Negative.  Negative for dizziness, seizures, numbness and headaches.  Hematological: Negative.   Psychiatric/Behavioral: Negative.  Negative for behavioral problems, self-injury and suicidal ideas. The patient is not nervous/anxious.    Vital Signs: BP 128/72   Pulse (!) 105   Temp 98.8 F (37.1 C)   Resp 16   Ht 5' (1.524 m)   Wt 76 lb (34.5 kg)   SpO2 98%   BMI 14.84 kg/m    Physical Exam Vitals reviewed.  Constitutional:      General: She is not in acute distress.    Appearance: She is well-developed. She is not diaphoretic.  HENT:     Head: Normocephalic and atraumatic.     Mouth/Throat:     Pharynx: No oropharyngeal exudate.  Eyes:     Pupils: Pupils are equal, round, and reactive to light.  Neck:     Thyroid: No thyromegaly.     Vascular: No JVD.     Trachea: No tracheal deviation.  Cardiovascular:     Rate and Rhythm: Normal rate and regular rhythm.     Heart sounds: Normal heart sounds. No murmur heard.   No friction rub. No gallop.  Pulmonary:     Effort: Pulmonary effort is normal. No respiratory distress.     Breath sounds: No wheezing or rales.  Chest:     Chest wall: No tenderness.  Abdominal:     General: Bowel sounds are normal.     Palpations: Abdomen is soft.  Musculoskeletal:        General: Normal range of motion.     Cervical back: Normal range of motion and neck supple.  Lymphadenopathy:     Cervical: No cervical adenopathy.  Skin:    General: Skin is warm and dry.  Neurological:     Mental Status: She is alert and oriented to person, place, and time.     Cranial Nerves: No cranial nerve deficit.  Psychiatric:        Behavior: Behavior normal.        Thought Content: Thought content normal.        Judgment: Judgment normal.    Assessment/Plan: 1. Encounter for general adult medical examination with abnormal  findings No Age-appropriate preventive screenings due, annual  physical exam completed. Routine labs for health maintenance previously ordered.   2. COPD with chronic bronchitis and emphysema ([place]) Her last PFT was read at severe obstructive lung disease but she is stable at this time. Consider switching her to trelegy ellipta if her current medications are not adequately controlling symptoms. Refills ordered. - ipratropium-albuterol (DUONEB) 0.5-2.5 (3) MG/3ML SOLN; INHALE THE CONTENTS OF 1 VIAL (3 ML) VIANEBULIZER EVERY 4 HOURS  Dispense: 360 mL; Refill: 3 - Tiotropium Bromide Monohydrate (SPIRIVA RESPIMAT) 2.5 MCG/ACT AERS; Inhale 1 puff into the lungs daily.  Dispense: 30 each; Refill: 5  3. Essential hypertension Stable on current medications, refills ordered.  - amLODipine (NORVASC) 2.5 MG tablet; Take 1 tablet (2.5 mg total) by mouth daily. Take along with 5 mg daily for total of 7.5 mg.  Dispense: 90 tablet; Refill: 3 - amLODipine (NORVASC) 5 MG tablet; Take 1 tablet (5 mg total) by mouth daily. Take along with 2.5 mg for a total of 7.5 mg.  Dispense: 90 tablet; Refill: 3  4. Unstable angina (HCC) Her current bottle of nitroglycerin tablets is 79 years old. Refill ordered.  - nitroGLYCERIN (NITROSTAT) 0.4 MG SL tablet; Place 1 tablet (0.4 mg total) under the tongue every 5 (five) minutes as needed for chest pain.  Dispense: 20 tablet; Refill: 12  5. Generalized anxiety disorder Stable at this time, refill ordered.  - LORazepam (ATIVAN) 0.5 MG tablet; Take 1 tablet (0.5 mg total) by mouth at bedtime as needed. TAKE 1/2 TO 1 TABLET BY MOUTH AT NIGHT  Dispense: 30 tablet; Refill: 1 - buPROPion (WELLBUTRIN) 100 MG tablet; Take 1 tablet (100 mg total) by mouth daily.  Dispense: 90 tablet; Refill: 1  6. Arthralgia, unspecified joint Refill ordered so she will have some on hand if needed.  - meloxicam (MOBIC) 15 MG tablet; Take 1 tablet (15 mg total) by mouth daily.  Dispense: 30 tablet; Refill: 0  7. Bilateral carotid artery stenosis S/P right carotid  endarterectomy. Plavix refill ordered.  - clopidogrel (PLAVIX) 75 MG tablet; Take 1 tablet (75 mg total) by mouth daily.  Dispense: 90 tablet; Refill: 3  8. Dysuria Routine urinalysis done.  - UA/M w/rflx Culture, Routine   General Counseling: Christina Hester understanding of the findings of todays visit and agrees with plan of treatment. I have discussed any further diagnostic evaluation that may be needed or ordered today. We also reviewed her medications today. she has been encouraged to call the office with any questions or concerns that should arise related to todays visit.    Orders Placed This Encounter  Procedures   UA/M w/rflx Culture, Routine    Meds ordered this encounter  Medications   LORazepam (ATIVAN) 0.5 MG tablet    Sig: Take 1 tablet (0.5 mg total) by mouth at bedtime as needed. TAKE 1/2 TO 1 TABLET BY MOUTH AT NIGHT    Dispense:  30 tablet    Refill:  1   nitroGLYCERIN (NITROSTAT) 0.4 MG SL tablet    Sig: Place 1 tablet (0.4 mg total) under the tongue every 5 (five) minutes as needed for chest pain.    Dispense:  20 tablet    Refill:  12   buPROPion (WELLBUTRIN) 100 MG tablet    Sig: Take 1 tablet (100 mg total) by mouth daily.    Dispense:  90 tablet    Refill:  1   meloxicam (MOBIC) 15 MG tablet    Sig:  Take 1 tablet (15 mg total) by mouth daily.    Dispense:  30 tablet    Refill:  0   amLODipine (NORVASC) 2.5 MG tablet    Sig: Take 1 tablet (2.5 mg total) by mouth daily. Take along with 5 mg daily for total of 7.5 mg.    Dispense:  90 tablet    Refill:  3   amLODipine (NORVASC) 5 MG tablet    Sig: Take 1 tablet (5 mg total) by mouth daily. Take along with 2.5 mg for a total of 7.5 mg.    Dispense:  90 tablet    Refill:  3   clopidogrel (PLAVIX) 75 MG tablet    Sig: Take 1 tablet (75 mg total) by mouth daily.    Dispense:  90 tablet    Refill:  3    Patient is due for a follow up appointment. Please contact the office at [phone]    ipratropium-albuterol (DUONEB) 0.5-2.5 (3) MG/3ML SOLN    Sig: INHALE THE CONTENTS OF 1 VIAL (3 ML) VIANEBULIZER EVERY 4 HOURS    Dispense:  360 mL    Refill:  3   Tiotropium Bromide Monohydrate (SPIRIVA RESPIMAT) 2.5 MCG/ACT AERS    Sig: Inhale 1 puff into the lungs daily.    Dispense:  30 each    Refill:  5    Return in about 6 months (around 07/21/2021) for F/U, med refill, Daysi Boggan PCP.   Total time spent:30 Minutes Time spent includes review of chart, medications, test results, and follow up plan with the patient.   [place] Controlled Substance Database was reviewed by me.  This patient was seen by Jonetta Osgood, FNP-C in collaboration with Dr. Clayborn Bigness as a part of collaborative care agreement.  Gorgeous [place] R. Valetta Fuller, MSN, FNP-C Internal medicine

## 2021-01-19 LAB — UA/M W/RFLX CULTURE, ROUTINE
Bilirubin, UA: NEGATIVE
Glucose, UA: NEGATIVE
Ketones, UA: NEGATIVE
Leukocytes,UA: NEGATIVE
Nitrite, UA: NEGATIVE
Protein,UA: NEGATIVE
RBC, UA: NEGATIVE
Specific Gravity, UA: 1.01 (ref 1.005–1.030)
Urobilinogen, Ur: 0.2 mg/dL (ref 0.2–1.0)
pH, UA: 7 (ref 5.0–7.5)

## 2021-01-19 LAB — MICROSCOPIC EXAMINATION
Bacteria, UA: NONE SEEN
Casts: NONE SEEN /lpf
Epithelial Cells (non renal): NONE SEEN /hpf (ref 0–10)
RBC, Urine: NONE SEEN /hpf (ref 0–2)
WBC, UA: NONE SEEN /hpf (ref 0–5)

## 2021-02-08 DIAGNOSIS — R5383 Other fatigue: Secondary | ICD-10-CM | POA: Diagnosis not present

## 2021-02-09 DIAGNOSIS — M5416 Radiculopathy, lumbar region: Secondary | ICD-10-CM | POA: Diagnosis not present

## 2021-02-09 DIAGNOSIS — M25551 Pain in right hip: Secondary | ICD-10-CM | POA: Diagnosis not present

## 2021-02-09 LAB — COMPREHENSIVE METABOLIC PANEL
ALT: 16 IU/L (ref 0–32)
AST: 20 IU/L (ref 0–40)
Albumin/Globulin Ratio: 2 (ref 1.2–2.2)
Albumin: 4.3 g/dL (ref 3.7–4.7)
Alkaline Phosphatase: 93 IU/L (ref 44–121)
BUN/Creatinine Ratio: 15 (ref 12–28)
BUN: 12 mg/dL (ref 8–27)
Bilirubin Total: 0.3 mg/dL (ref 0.0–1.2)
CO2: 29 mmol/L (ref 20–29)
Calcium: 9.5 mg/dL (ref 8.7–10.3)
Chloride: 94 mmol/L — ABNORMAL LOW (ref 96–106)
Creatinine, Ser: 0.81 mg/dL (ref 0.57–1.00)
Globulin, Total: 2.2 g/dL (ref 1.5–4.5)
Glucose: 103 mg/dL — ABNORMAL HIGH (ref 65–99)
Potassium: 4.2 mmol/L (ref 3.5–5.2)
Sodium: 137 mmol/L (ref 134–144)
Total Protein: 6.5 g/dL (ref 6.0–8.5)
eGFR: 74 mL/min/{1.73_m2} (ref >59)

## 2021-02-09 LAB — CBC WITH DIFFERENTIAL/PLATELET
Basophils Absolute: 0 10*3/uL (ref 0.0–0.2)
Basos: 0 %
EOS (ABSOLUTE): 0.3 10*3/uL (ref 0.0–0.4)
Eos: 6 %
Hematocrit: 37.5 % (ref 34.0–46.6)
Hemoglobin: 12.9 g/dL (ref 11.1–15.9)
Immature Grans (Abs): 0 10*3/uL (ref 0.0–0.1)
Immature Granulocytes: 0 %
Lymphocytes Absolute: 1.5 10*3/uL (ref 0.7–3.1)
Lymphs: 30 %
MCH: 30.6 pg (ref 26.6–33.0)
MCHC: 34.4 g/dL (ref 31.5–35.7)
MCV: 89 fL (ref 79–97)
Monocytes Absolute: 0.6 10*3/uL (ref 0.1–0.9)
Monocytes: 12 %
Neutrophils Absolute: 2.7 10*3/uL (ref 1.4–7.0)
Neutrophils: 52 %
Platelets: 262 10*3/uL (ref 150–450)
RBC: 4.22 x10E6/uL (ref 3.77–5.28)
RDW: 12.8 % (ref 11.7–15.4)
WBC: 5.2 10*3/uL (ref 3.4–10.8)

## 2021-02-09 LAB — LIPID PANEL WITH LDL/HDL RATIO
Cholesterol, Total: 262 mg/dL — ABNORMAL HIGH (ref 100–199)
HDL: 103 mg/dL (ref >39)
LDL Chol Calc (NIH): 150 mg/dL — ABNORMAL HIGH (ref 0–99)
LDL/HDL Ratio: 1.5 ratio (ref 0.0–3.2)
Triglycerides: 56 mg/dL (ref 0–149)
VLDL Cholesterol Cal: 9 mg/dL (ref 5–40)

## 2021-02-09 LAB — TSH+FREE T4
Free T4: 1.11 ng/dL (ref 0.82–1.77)
TSH: 2.99 u[IU]/mL (ref 0.450–4.500)

## 2021-02-24 ENCOUNTER — Telehealth: Payer: Self-pay

## 2021-03-02 NOTE — Telephone Encounter (Signed)
Pt is unable to take statin meds, she gets so weak she cant walk to the mail box. She has tried at least three per pt. Pt wants to try Tricor at the lowest does possible. I did reiterate that high intensity statin therapy was recommended and that the plan was to start her at a low dose and work her way up to a stronger dose. I told her if her provider did think the Tricor was the best option then she may still end up increasing the dose at some point but that can be discussed and we would call her back and let her know what the new plan is.

## 2021-03-02 NOTE — Telephone Encounter (Signed)
LMOM to call back regarding labs

## 2021-03-03 NOTE — Telephone Encounter (Signed)
I understand, Tricor is fine, I will put the order in for the lowest initial dose.

## 2021-03-03 NOTE — Telephone Encounter (Signed)
Spoke with pt, informed her med was sent to pharmacy

## 2021-03-08 DIAGNOSIS — H43313 Vitreous membranes and strands, bilateral: Secondary | ICD-10-CM | POA: Diagnosis not present

## 2021-04-12 ENCOUNTER — Other Ambulatory Visit: Payer: Self-pay | Admitting: Nurse Practitioner

## 2021-04-12 DIAGNOSIS — J449 Chronic obstructive pulmonary disease, unspecified: Secondary | ICD-10-CM

## 2021-04-12 DIAGNOSIS — M2550 Pain in unspecified joint: Secondary | ICD-10-CM

## 2021-04-12 DIAGNOSIS — F411 Generalized anxiety disorder: Secondary | ICD-10-CM

## 2021-04-12 DIAGNOSIS — I6523 Occlusion and stenosis of bilateral carotid arteries: Secondary | ICD-10-CM

## 2021-04-12 DIAGNOSIS — Z0001 Encounter for general adult medical examination with abnormal findings: Secondary | ICD-10-CM

## 2021-04-12 DIAGNOSIS — R300 Dysuria: Secondary | ICD-10-CM

## 2021-04-12 DIAGNOSIS — I200 Unstable angina: Secondary | ICD-10-CM

## 2021-04-12 DIAGNOSIS — Z23 Encounter for immunization: Secondary | ICD-10-CM | POA: Diagnosis not present

## 2021-04-12 DIAGNOSIS — I10 Essential (primary) hypertension: Secondary | ICD-10-CM

## 2021-04-12 NOTE — Telephone Encounter (Signed)
Last 7/22 and next 1/23

## 2021-04-12 NOTE — Telephone Encounter (Signed)
done

## 2021-05-03 ENCOUNTER — Ambulatory Visit
Admission: RE | Admit: 2021-05-03 | Discharge: 2021-05-03 | Disposition: A | Discharge status: Home / Self Care | Payer: Medicare Other | Attending: Nurse Practitioner | Admitting: Nurse Practitioner

## 2021-05-03 ENCOUNTER — Ambulatory Visit
Admission: RE | Admit: 2021-05-03 | Discharge: 2021-05-03 | Disposition: A | Discharge status: Home / Self Care | Payer: Medicare Other | Source: Ambulatory Visit | Attending: Nurse Practitioner | Admitting: Nurse Practitioner

## 2021-05-03 ENCOUNTER — Other Ambulatory Visit: Payer: Self-pay

## 2021-05-03 ENCOUNTER — Encounter: Payer: Self-pay | Admitting: Nurse Practitioner

## 2021-05-03 ENCOUNTER — Ambulatory Visit (INDEPENDENT_AMBULATORY_CARE_PROVIDER_SITE_OTHER): Payer: Medicare Other | Admitting: Nurse Practitioner

## 2021-05-03 DIAGNOSIS — J441 Chronic obstructive pulmonary disease with (acute) exacerbation: Secondary | ICD-10-CM

## 2021-05-03 DIAGNOSIS — I10 Essential (primary) hypertension: Secondary | ICD-10-CM | POA: Diagnosis not present

## 2021-05-03 DIAGNOSIS — Z0001 Encounter for general adult medical examination with abnormal findings: Secondary | ICD-10-CM

## 2021-05-03 DIAGNOSIS — R300 Dysuria: Secondary | ICD-10-CM

## 2021-05-03 DIAGNOSIS — I6523 Occlusion and stenosis of bilateral carotid arteries: Secondary | ICD-10-CM

## 2021-05-03 DIAGNOSIS — M2550 Pain in unspecified joint: Secondary | ICD-10-CM

## 2021-05-03 DIAGNOSIS — F411 Generalized anxiety disorder: Secondary | ICD-10-CM | POA: Diagnosis not present

## 2021-05-03 DIAGNOSIS — I200 Unstable angina: Secondary | ICD-10-CM

## 2021-05-03 MED ORDER — LEVOFLOXACIN 750 MG PO TABS: 750.0000 mg | ORAL_TABLET | Freq: Every day | ORAL | 0 refills | Status: DC

## 2021-05-03 MED ORDER — LORAZEPAM 0.5 MG PO TABS: 0.2500 mg | ORAL_TABLET | Freq: Three times a day (TID) | ORAL | 0 refills | Status: DC | PRN

## 2021-05-03 MED ORDER — PREDNISONE 10 MG PO TABS: ORAL_TABLET | ORAL | 0 refills | Status: DC

## 2021-05-03 NOTE — Progress Notes (Signed)
[facility] [place], [place] [address]  Internal MEDICINE  Office Visit Note  Patient Name: Christina Hester  [id number]  [id number]  Date of Service: 05/03/2021  Chief Complaint  Patient presents with   Acute Visit    Neg covid test, difficulty sleeping and breathing, did 7 to 8 nebulizer treatments yesterday along with using rescue inhaler   Shortness of Breath   COPD     HPI Christina Hester presents for an acute sick visit for increased shortness of breath. Her covid test was negative. She is having difficult sleeping due to increased SOB. She did 7 or 8 nebulizer treatments at home yesterday and used her rescue inhaler but still felt short of breath. She has COPD. She reports that she is having wheezing, cough and chest tightness along with the SOB. This has been going on for several days. She still smokes cigarettes as well.    Current Medication:  Outpatient Encounter Medications as of 05/03/2021  Medication Sig   amLODipine (NORVASC) 2.5 MG tablet Take 1 tablet (2.5 mg total) by mouth daily. Take along with 5 mg daily for total of 7.5 mg.   amLODipine (NORVASC) 5 MG tablet Take 1 tablet (5 mg total) by mouth daily. Take along with 2.5 mg for a total of 7.5 mg.   buPROPion (WELLBUTRIN) 100 MG tablet Take 1 tablet (100 mg total) by mouth daily.   clopidogrel (PLAVIX) 75 MG tablet Take 1 tablet (75 mg total) by mouth daily.   clotrimazole (MYCELEX) 10 MG troche DISSOLVE 1 TABLET BY MOUTH 5 TIMES DAILY   ipratropium-albuterol (DUONEB) 0.5-2.5 (3) MG/3ML SOLN INHALE THE CONTENTS OF 1 VIAL (3 ML) VIANEBULIZER EVERY 4 HOURS   levofloxacin (LEVAQUIN) 750 MG tablet Take 1 tablet (750 mg total) by mouth daily.   meloxicam (MOBIC) 15 MG tablet Take 1 tablet (15 mg total) by mouth daily.   nitroGLYCERIN (NITROSTAT) 0.4 MG SL tablet Place 1 tablet (0.4 mg total) under the tongue every 5 (five) minutes as needed for chest pain.   nystatin (MYCOSTATIN) 100000 UNIT/ML  suspension Take 5 mLs (500,000 Units total) by mouth 4 (four) times daily.   predniSONE (DELTASONE) 10 MG tablet Take one tab 3 x day for 3 days, then take one tab 2 x a day for 3 days and then take one tab a day for 3 days for copd   Tiotropium Bromide Monohydrate (SPIRIVA RESPIMAT) 2.5 MCG/ACT AERS Inhale 1 puff into the lungs daily.   VENTOLIN HFA 108 (90 Base) MCG/ACT inhaler INHALE 2 PUFFS INTO THE LUNGS EVERY 6 HOURS AS NEEDED FOR WHEEZING OR SHORTNESS OF BREATH.   [DISCONTINUED] LORazepam (ATIVAN) 0.5 MG tablet TAKE 1/2 TO 1 TABLET BY MOUTH AT NIGHT AS NEEDED   LORazepam (ATIVAN) 0.5 MG tablet Take 0.5-1 tablets (0.25-0.5 mg total) by mouth every 8 (eight) hours as needed for anxiety or sleep.   No facility-administered encounter medications on file as of 05/03/2021.      Medical History: Past Medical History:  Diagnosis Date   AAA (abdominal aortic aneurysm)    a. 05/2016 Abd U/S: 3.02 x 3.02 AAA.   Anxiety    CAD (coronary artery disease)    a. 02/2017 NSTEMI/Cath: LM nl, LAD mild dzs, D2 min irregs, LCX mild dzs, OM2 50ost, RCA 95p, 166m, L-->R collats, EF 55-65%.   COPD (chronic obstructive pulmonary disease) (HCC)    Diastolic dysfunction    a. 02/2017 Echo: EF 55-60%, gr1 DD, ? inf HK,  mild MR.   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 05/2016 ABI's: R 1.22, L 1.11.   Tobacco abuse      Vital Signs: BP (!) 154/84   Pulse 100   Temp 98.4 F (36.9 C)   Resp 16   Ht 5' (1.524 m)   Wt 76 lb 6.4 oz (34.7 kg)   SpO2 97%   BMI 14.92 kg/m    Review of Systems  Constitutional:  Positive for fatigue. Negative for chills, diaphoresis and fever.  HENT: Negative.  Negative for congestion, postnasal drip, rhinorrhea, sinus pressure, sinus pain and sneezing.   Respiratory:  Positive for cough, chest tightness, shortness of breath  and wheezing.   Cardiovascular: Negative.  Negative for chest pain and palpitations.  Gastrointestinal: Negative.  Negative for constipation, diarrhea, nausea and vomiting.  Musculoskeletal: Negative.  Negative for myalgias.  Skin:  Negative for rash.   Physical Exam Vitals reviewed.  Constitutional:      General: She is not in acute distress.    Appearance: She is ill-appearing.  HENT:     Head: Normocephalic and atraumatic.  Eyes:     Extraocular Movements: Extraocular movements intact.     Pupils: Pupils are equal, round, and reactive to light.  Cardiovascular:     Rate and Rhythm: Regular rhythm. Tachycardia present.     Heart sounds: Normal heart sounds.  Pulmonary:     Effort: No respiratory distress.     Breath sounds: Examination of the right-upper field reveals wheezing. Examination of the left-upper field reveals wheezing. Examination of the right-middle field reveals decreased breath sounds. Examination of the left-middle field reveals decreased breath sounds. Examination of the right-lower field reveals decreased breath sounds. Examination of the left-lower field reveals decreased breath sounds. Decreased breath sounds and wheezing present.  Neurological:     Mental Status: She is oriented to person, place, and time.  Psychiatric:        Mood and Affect: Mood normal.        Behavior: Behavior normal.      Assessment/Plan: 1. COPD with acute exacerbation ([place]) Chest xray ordered to rule out pneumonia. Prednisone and levofloxacin prescribed for COPD exacerbation, levofloxacin chosen to provide coverage for possible community -acquired pneumonia. Follow up in 2 weeks to reassess for improvement.   - levofloxacin (LEVAQUIN) 750 MG tablet; Take 1 tablet (750 mg total) by mouth daily.  Dispense: 5 tablet; Refill: 0 - predniSONE (DELTASONE) 10 MG tablet; Take one tab 3 x day for 3 days, then take one tab 2 x a day for 3 days and then take one tab a day for 3 days for copd   Dispense: 18 tablet; Refill: 0 - DG Chest 2 View; Future  2. Essential hypertension Blood pressure is elevated today but the patient is having an acute exacerbation of her COPD. Patient reminded to monitor her blood pressure at home and to make sure she takes her medications. If her blood pressure remains elevated once she is feeling better, will need to discuss medication adjustment. Follow up in 2 weeks.   3. Generalized anxiety disorder Takes lorazepam for anxiety, refill ordered.  - LORazepam (ATIVAN) 0.5 MG tablet; Take 0.5-1 tablets (0.25-0.5 mg total) by mouth every 8 (eight) hours as needed for anxiety or sleep.  Dispense: 60 tablet; Refill: 0   General Counseling: Christina Hester verbalizes understanding of the findings of todays visit and agrees with plan of treatment. I have discussed any further diagnostic evaluation that may be needed or ordered today. We also reviewed her medications today. she has been encouraged to call the office with any questions or concerns that should arise related to todays visit.    Counseling:    Orders Placed This Encounter  Procedures   DG Chest 2 View    Meds ordered this encounter  Medications   levofloxacin (LEVAQUIN) 750 MG tablet    Sig: Take 1 tablet (750 mg total) by mouth daily.    Dispense:  5 tablet    Refill:  0   predniSONE (DELTASONE) 10 MG tablet    Sig: Take one tab 3 x day for 3 days, then take one tab 2 x a day for 3 days and then take one tab a day for 3 days for copd    Dispense:  18 tablet    Refill:  0   LORazepam (ATIVAN) 0.5 MG tablet    Sig: Take 0.5-1 tablets (0.25-0.5 mg total) by mouth every 8 (eight) hours as needed for anxiety or sleep.    Dispense:  60 tablet    Refill:  0    Note change in dose and frequency.    Return in about 2 weeks (around 05/17/2021) for F/U copd exacerbation , Raniah Karan PCP.  [place] Controlled Substance Database was reviewed by me for overdose risk score (ORS)  Time spent:30 Minutes Time spent  with patient included reviewing progress notes, labs, imaging studies, and discussing plan for follow up.   This patient was seen by Jonetta Osgood, FNP-C in collaboration with Dr. Clayborn Bigness as a part of collaborative care agreement.  Ariyana Faw R. Valetta Fuller, MSN, FNP-C Internal Medicine

## 2021-05-04 ENCOUNTER — Telehealth: Payer: Self-pay

## 2021-05-04 NOTE — Progress Notes (Signed)
Please call patient and let her know that she does not have pneumonia but to continue her antibiotic until it is all finished for her COPD exacerbation

## 2021-05-04 NOTE — Telephone Encounter (Signed)
Spoke to pt, she is feeling better. I told her to finish all the antibiotic and let her know she did not have pneumonia

## 2021-05-04 NOTE — Telephone Encounter (Signed)
-----   Message from Christina Osgood, NP sent at 05/04/2021  8:04 AM EDT ----- Please call patient and let her know that she does not have pneumonia but to continue her antibiotic until it is all finished for her COPD exacerbation

## 2021-05-18 ENCOUNTER — Ambulatory Visit (INDEPENDENT_AMBULATORY_CARE_PROVIDER_SITE_OTHER): Payer: Medicare Other | Admitting: Nurse Practitioner

## 2021-05-18 ENCOUNTER — Other Ambulatory Visit: Payer: Self-pay

## 2021-05-18 ENCOUNTER — Telehealth: Payer: Self-pay

## 2021-05-18 ENCOUNTER — Encounter: Payer: Self-pay | Admitting: Nurse Practitioner

## 2021-05-18 VITALS — BP 134/58 | HR 100 | Temp 98.7°F | Resp 16 | Ht 60.0 in | Wt 74.2 lb

## 2021-05-18 DIAGNOSIS — G8929 Other chronic pain: Secondary | ICD-10-CM

## 2021-05-18 DIAGNOSIS — M5441 Lumbago with sciatica, right side: Secondary | ICD-10-CM | POA: Diagnosis not present

## 2021-05-18 DIAGNOSIS — J432 Centrilobular emphysema: Secondary | ICD-10-CM | POA: Diagnosis not present

## 2021-05-18 DIAGNOSIS — B370 Candidal stomatitis: Secondary | ICD-10-CM

## 2021-05-18 MED ORDER — FLUCONAZOLE 150 MG PO TABS: 150.0000 mg | ORAL_TABLET | Freq: Once | ORAL | 0 refills | Status: AC

## 2021-05-18 NOTE — Progress Notes (Signed)
[facility] [place], [place] [address]  Internal MEDICINE  Office Visit Note  Patient Name: Christina Hester  [id number]  [id number]  Date of Service: 05/18/2021  Chief Complaint  Patient presents with   Follow-up    Refills, acupuncture referral, pain in back causing right leg to go numb down to knee, scoliosis, arthritis, sciatica   Hyperlipidemia   Hypertension   COPD   Anxiety   Fatigue    HPI Christina Hester presents for a follow up visit for COPD. She had a COPD exacerbation in October and was treated for this. She is feeling better now. She continues to smoke despite having severe COPD.  She has significant arthritis including back pain, with sciatica and scoliosis and she would like to try acupuncture to see if it helps her arthritis pain.   She also states that she has a yeast infection from the antibiotic she was on and would like treatment for that.      Current Medication: Outpatient Encounter Medications as of 05/18/2021  Medication Sig   amLODipine (NORVASC) 2.5 MG tablet Take 1 tablet (2.5 mg total) by mouth daily. Take along with 5 mg daily for total of 7.5 mg.   amLODipine (NORVASC) 5 MG tablet Take 1 tablet (5 mg total) by mouth daily. Take along with 2.5 mg for a total of 7.5 mg.   Budeson-Glycopyrrol-Formoterol (BREZTRI AEROSPHERE) 160-9-4.8 MCG/ACT AERO Inhale 2 puffs into the lungs 2 (two) times daily.   buPROPion (WELLBUTRIN) 100 MG tablet Take 1 tablet (100 mg total) by mouth daily.   clopidogrel (PLAVIX) 75 MG tablet Take 1 tablet (75 mg total) by mouth daily.   clotrimazole (MYCELEX) 10 MG troche DISSOLVE 1 TABLET BY MOUTH 5 TIMES DAILY   [EXPIRED] fluconazole (DIFLUCAN) 150 MG tablet Take 1 tablet (150 mg total) by mouth once for 1 dose. May take an additional dose x2 after 3 days if still symptomatic.   ipratropium-albuterol (DUONEB) 0.5-2.5 (3) MG/3ML SOLN INHALE THE CONTENTS OF 1 VIAL (3 ML) VIANEBULIZER EVERY 4 HOURS   levofloxacin  (LEVAQUIN) 750 MG tablet Take 1 tablet (750 mg total) by mouth daily.   meloxicam (MOBIC) 15 MG tablet Take 1 tablet (15 mg total) by mouth daily.   nitroGLYCERIN (NITROSTAT) 0.4 MG SL tablet Place 1 tablet (0.4 mg total) under the tongue every 5 (five) minutes as needed for chest pain.   nystatin (MYCOSTATIN) 100000 UNIT/ML suspension Take 5 mLs (500,000 Units total) by mouth 4 (four) times daily.   predniSONE (DELTASONE) 10 MG tablet Take one tab 3 x day for 3 days, then take one tab 2 x a day for 3 days and then take one tab a day for 3 days for copd   VENTOLIN HFA 108 (90 Base) MCG/ACT inhaler INHALE 2 PUFFS INTO THE LUNGS EVERY 6 HOURS AS NEEDED FOR WHEEZING OR SHORTNESS OF BREATH.   [DISCONTINUED] LORazepam (ATIVAN) 0.5 MG tablet Take 0.5-1 tablets (0.25-0.5 mg total) by mouth every 8 (eight) hours as needed for anxiety or sleep.   [DISCONTINUED] Tiotropium Bromide Monohydrate (SPIRIVA RESPIMAT) 2.5 MCG/ACT AERS Inhale 1 puff into the lungs daily.   No facility-administered encounter medications on file as of 05/18/2021.    Surgical History: Past Surgical History:  Procedure Laterality Date   ABDOMINAL HYSTERECTOMY     APPENDECTOMY     BREAST EXCISIONAL BIOPSY Left 80's or 90's   NEG   CARDIAC CATHETERIZATION     COLONOSCOPY N/A 05/22/2018   Procedure:  COLONOSCOPY;  Surgeon: Christina Landsman, MD;  Location: [facility] ENDOSCOPY;  Service: Gastroenterology;  Laterality: N/A;   COLONOSCOPY WITH PROPOFOL N/A 07/19/2020   Procedure: COLONOSCOPY WITH PROPOFOL;  Surgeon: Toledo, Benay Pike, MD;  Location: [facility] ENDOSCOPY;  Service: Gastroenterology;  Laterality: N/A;   ENDARTERECTOMY Right 11/06/2019   Procedure: ENDARTERECTOMY CAROTID;  Surgeon: Christina Huxley, MD;  Location: [facility] ORS;  Service: Vascular;  Laterality: Right;   LEFT HEART CATH Bilateral 03/05/2017   Procedure: Left Heart Cath with poss PCI;  Surgeon: Christina Hampshire, MD;  Location: [place] CV LAB;  Service: Cardiovascular;   Laterality: Bilateral;    Medical History: Past Medical History:  Diagnosis Date   AAA (abdominal aortic aneurysm)    a. 05/2016 Abd U/S: 3.02 x 3.02 AAA.   Anxiety    CAD (coronary artery disease)    a. 02/2017 NSTEMI/Cath: LM nl, LAD mild dzs, D2 min irregs, LCX mild dzs, OM2 50ost, RCA 95p, 134m, L-->R collats, EF 55-65%.   COPD (chronic obstructive pulmonary disease) (HCC)    Diastolic dysfunction    a. 02/2017 Echo: EF 55-60%, gr1 DD, ? inf HK, mild MR.   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 05/2016 ABI's: R 1.22, L 1.11.   Tobacco abuse     Family History: Family History  Problem Relation Age of Onset   Breast cancer Mother    Dementia Mother    Hypertension Mother    Breast cancer Maternal Aunt    Breast cancer Maternal Aunt    Breast cancer Maternal Aunt    CVA Father     Social History   Socioeconomic History   Marital status: Divorced    Spouse name: Not on file   Number of children: Not on file   Years of education: Not on file   Highest education level: Not on file  Occupational History   Not on file  Tobacco Use   Smoking status: Former    Packs/day: 0.25    Types: Cigarettes    Quit date: 07/12/2017    Years since quitting: 3.9   Smokeless tobacco: Never   Tobacco comments:    currently 90 days without smoking  Vaping Use   Vaping Use: Never used  Substance and Sexual Activity   Alcohol use: No   Drug use: No   Sexual activity: Not on file  Other Topics Concern   Not on file  Social History Narrative   Not on file   Social Determinants of Health   Financial Resource Strain: Not on file  Food Insecurity: Not on file  Transportation Needs: Not on file  Physical Activity: Not on file  Stress: Not on file  Social Connections: Not on file  Intimate Partner Violence: Not on file       Review of Systems  Constitutional:  Negative for chills, fatigue and unexpected weight change.  HENT:  Negative for congestion, rhinorrhea, sneezing and sore throat.   Eyes:  Negative for redness.  Respiratory:  Positive for cough (chronic), shortness of breath (intermittent) and wheezing (intermittent). Negative for chest tightness.   Cardiovascular:  Negative for chest pain and palpitations.  Gastrointestinal:  Negative for abdominal pain, constipation, diarrhea, nausea and vomiting.  Genitourinary:  Negative for dysuria and frequency.  Musculoskeletal:  Positive for arthralgias and back pain. Negative for joint swelling and neck pain.  Skin:  Negative for rash.  Neurological: Negative.  Negative for tremors and numbness.  Hematological:  Negative for adenopathy. Does not bruise/bleed easily.  Psychiatric/Behavioral:  Negative for behavioral problems (Depression), sleep disturbance and suicidal ideas. The patient is not nervous/anxious.    Vital Signs: BP (!) 134/58   Pulse 100   Temp 98.7 F (37.1 C)   Resp 16   Ht 5' (1.524 m)   Wt 74 lb 3.2 oz (33.7 kg)   SpO2 95%   BMI 14.49 kg/m    Physical Exam Vitals reviewed.  Constitutional:      General: She is awake. She is not in acute distress.    Appearance: Normal appearance. She is well-groomed and underweight. She is not ill-appearing or toxic-appearing.  HENT:     Head: Normocephalic and atraumatic.  Eyes:     Pupils: Pupils are equal, round, and reactive to light.  Cardiovascular:     Rate and Rhythm: Normal rate and regular rhythm.     Pulses: Normal pulses.     Heart sounds: Normal heart sounds. No murmur heard. Pulmonary:     Effort: Pulmonary effort is normal. No respiratory distress.     Breath sounds: Normal breath sounds. No wheezing.  Neurological:     Mental Status: She is alert and oriented to person, place, and time.     Cranial Nerves: No cranial nerve deficit.     Coordination: Coordination  normal.     Gait: Gait normal.  Psychiatric:        Mood and Affect: Mood normal.        Behavior: Behavior normal. Behavior is cooperative.       Assessment/Plan: 1. Centrilobular emphysema (HCC) Trial sample of breztri x1 week, will send prescription if she feels like it works better. Other inhalers will be discontinued other than rescue inhaler.   2. Chronic right-sided low back pain with right-sided sciatica Wants to try acupuncture to see if it helps her back pain, referral sent to acupuncture clinic that accepts medicare - Ambulatory referral for Acupuncture  3. Oropharyngeal candidiasis Treatment prescribed and sent to pharmacy - fluconazole (DIFLUCAN) 150 MG tablet; Take 1 tablet (150 mg total) by mouth once for 1 dose. May take an additional dose x2 after 3 days if still symptomatic.  Dispense: 3 tablet; Refill: 0   General Counseling: Kharizma verbalizes understanding of the findings of todays visit and agrees with plan of treatment. I have discussed any further diagnostic evaluation that may be needed or ordered today. We also reviewed her medications today. she has been encouraged to call the office with any questions or concerns that should arise related to todays visit.    Orders Placed This Encounter  Procedures   Ambulatory referral for Acupuncture    Meds ordered this encounter  Medications   fluconazole (DIFLUCAN) 150 MG tablet    Sig: Take 1 tablet (150 mg total) by mouth once for 1 dose. May take an additional dose x2 after 3 days if still symptomatic.    Dispense:  3 tablet    Refill:  0   Budeson-Glycopyrrol-Formoterol (BREZTRI AEROSPHERE) 160-9-4.8 MCG/ACT AERO    Sig: Inhale 2 puffs into the lungs 2 (two) times daily.    Dispense:  10.7 g    Refill:  11    Return in about 4 weeks (around 06/15/2021) for F/U, eval new med, [place] PCP.   Total  time spent:30 Minutes Time spent includes review of chart, medications, test results, and follow up plan  with the patient.   [place] Controlled Substance Database was reviewed by me.  This patient was seen by Jonetta Osgood, FNP-C in collaboration with Dr. Clayborn Bigness as a part of collaborative care agreement.   Gerline Ratto R. Valetta Fuller, MSN, FNP-C Internal medicine

## 2021-05-18 NOTE — Telephone Encounter (Signed)
Waiting on 05/18/21 office notes to be completed to manually fax acupuncture referral-Toni

## 2021-05-24 ENCOUNTER — Telehealth: Payer: Self-pay

## 2021-05-24 NOTE — Telephone Encounter (Signed)
Acupuncture referral manually faxed to [facility] [phone]

## 2021-05-24 NOTE — Telephone Encounter (Signed)
Pt walk in for samples breztri and alyssa will send to [facility]

## 2021-05-25 ENCOUNTER — Telehealth: Payer: Self-pay

## 2021-05-25 NOTE — Telephone Encounter (Signed)
Called [facility] for bills and gave add icd 10 code they will resubmit to medicare and also advised pt that disregard bill they will resubmit and its take 35 to 45 days

## 2021-05-26 ENCOUNTER — Other Ambulatory Visit: Payer: Self-pay | Admitting: Nurse Practitioner

## 2021-05-26 DIAGNOSIS — F411 Generalized anxiety disorder: Secondary | ICD-10-CM

## 2021-06-03 ENCOUNTER — Other Ambulatory Visit: Payer: Self-pay | Admitting: Nurse Practitioner

## 2021-06-03 DIAGNOSIS — F411 Generalized anxiety disorder: Secondary | ICD-10-CM

## 2021-06-03 NOTE — Telephone Encounter (Signed)
Last 11/22 and 06/16/2021

## 2021-06-03 NOTE — Telephone Encounter (Signed)
Med sent to pharmacy.

## 2021-06-13 ENCOUNTER — Encounter: Payer: Self-pay | Admitting: Nurse Practitioner

## 2021-06-13 MED ORDER — BREZTRI AEROSPHERE 160-9-4.8 MCG/ACT IN AERO: 2.0000 | INHALATION_SPRAY | Freq: Two times a day (BID) | RESPIRATORY_TRACT | 11 refills | Status: DC

## 2021-06-14 NOTE — Telephone Encounter (Signed)
Spoke with Corey Skains. She does not see referral. I faxed again to her-Toni

## 2021-06-16 ENCOUNTER — Ambulatory Visit (INDEPENDENT_AMBULATORY_CARE_PROVIDER_SITE_OTHER): Payer: Medicare Other | Admitting: Nurse Practitioner

## 2021-06-16 ENCOUNTER — Encounter: Payer: Self-pay | Admitting: Nurse Practitioner

## 2021-06-16 ENCOUNTER — Other Ambulatory Visit: Payer: Self-pay

## 2021-06-16 VITALS — BP 157/69 | HR 89 | Temp 98.2°F | Resp 16 | Ht 60.0 in | Wt 75.0 lb

## 2021-06-16 DIAGNOSIS — J9611 Chronic respiratory failure with hypoxia: Secondary | ICD-10-CM | POA: Diagnosis not present

## 2021-06-16 DIAGNOSIS — B370 Candidal stomatitis: Secondary | ICD-10-CM

## 2021-06-16 DIAGNOSIS — J439 Emphysema, unspecified: Secondary | ICD-10-CM

## 2021-06-16 DIAGNOSIS — J432 Centrilobular emphysema: Secondary | ICD-10-CM | POA: Diagnosis not present

## 2021-06-16 DIAGNOSIS — J449 Chronic obstructive pulmonary disease, unspecified: Secondary | ICD-10-CM

## 2021-06-16 DIAGNOSIS — I10 Essential (primary) hypertension: Secondary | ICD-10-CM

## 2021-06-16 DIAGNOSIS — G251 Drug-induced tremor: Secondary | ICD-10-CM | POA: Diagnosis not present

## 2021-06-16 MED ORDER — CLOTRIMAZOLE 10 MG MT TROC: 10.0000 mg | Freq: Every day | OROMUCOSAL | 2 refills | Status: DC

## 2021-06-16 NOTE — Progress Notes (Signed)
[facility] [place], [place] [address]  Internal MEDICINE  Office Visit Note  Patient Name: Christina Hester  [id number]  [id number]  Date of Service: 06/29/2021  Chief Complaint  Patient presents with   Follow-up    Discuss meds, has the shakes, back pain increased, med side effects   Hyperlipidemia   Hypertension   COPD   Anxiety    HPI Jeniece presents for a follow-up visit for  Kenndra presents for follow-up visit for medication review, hypertension, COPD and anxiety.  Luretha has acute on chronic respiratory failure with hypoxia secondary to COPD.  She has had multiple episodes of pneumonia and has been hospitalized for this in the past having tremors she is on several medications that can cause tremors.  Going to see if breztri is affordable.  Has white coat and HTN   Current Medication: Outpatient Encounter Medications as of 06/16/2021  Medication Sig   amLODipine (NORVASC) 2.5 MG tablet Take 1 tablet (2.5 mg total) by mouth daily. Take along with 5 mg daily for total of 7.5 mg.   amLODipine (NORVASC) 5 MG tablet Take 1 tablet (5 mg total) by mouth daily. Take along with 2.5 mg for a total of 7.5 mg.   Budeson-Glycopyrrol-Formoterol (BREZTRI AEROSPHERE) 160-9-4.8 MCG/ACT AERO Inhale 2 puffs into the lungs 2 (two) times daily.   buPROPion (WELLBUTRIN) 100 MG tablet Take 1 tablet (100 mg total) by mouth daily.   clopidogrel (PLAVIX) 75 MG tablet Take 1 tablet (75 mg total) by mouth daily.   clotrimazole (MYCELEX) 10 MG troche Take 1 tablet (10 mg total) by mouth 5 (five) times daily.   ipratropium-albuterol (DUONEB) 0.5-2.5 (3) MG/3ML SOLN INHALE THE CONTENTS OF 1 VIAL (3 ML) VIANEBULIZER EVERY 4 HOURS   levofloxacin (LEVAQUIN) 750 MG tablet Take 1 tablet (750 mg total) by mouth daily.   LORazepam (ATIVAN) 0.5 MG tablet TAKE 1/2 TO 1 TABLET BY MOUTH EVERY 8 HOURS AS NEEDED FOR ANXIETY OR SLEEP   meloxicam (MOBIC) 15 MG tablet Take 1 tablet (15 mg total)  by mouth daily.   nitroGLYCERIN (NITROSTAT) 0.4 MG SL tablet Place 1 tablet (0.4 mg total) under the tongue every 5 (five) minutes as needed for chest pain.   nystatin (MYCOSTATIN) 100000 UNIT/ML suspension Take 5 mLs (500,000 Units total) by mouth 4 (four) times daily.   VENTOLIN HFA 108 (90 Base) MCG/ACT inhaler INHALE 2 PUFFS INTO THE LUNGS EVERY 6 HOURS AS NEEDED FOR WHEEZING OR SHORTNESS OF BREATH.   [DISCONTINUED] clotrimazole (MYCELEX) 10 MG troche DISSOLVE 1 TABLET BY MOUTH 5 TIMES DAILY   [DISCONTINUED] predniSONE (DELTASONE) 10 MG tablet Take one tab 3 x day for 3 days, then take one tab 2 x a day for 3 days and then take one tab a day for 3 days for copd (Patient not taking: Reported on 06/16/2021)   No facility-administered encounter medications on file as of 06/16/2021.    Surgical History: Past Surgical History:  Procedure Laterality Date   ABDOMINAL HYSTERECTOMY     APPENDECTOMY     BREAST EXCISIONAL BIOPSY Left 80's or 90's   NEG   CARDIAC CATHETERIZATION     COLONOSCOPY N/A 05/22/2018   Procedure: COLONOSCOPY;  Surgeon: Lin Landsman, MD;  Location: [facility] ENDOSCOPY;  Service: Gastroenterology;  Laterality: N/A;   COLONOSCOPY WITH PROPOFOL N/A 07/19/2020   Procedure: COLONOSCOPY WITH PROPOFOL;  Surgeon: Toledo, Benay Pike, MD;  Location: [facility] ENDOSCOPY;  Service: Gastroenterology;  Laterality: N/A;  ENDARTERECTOMY Right 11/06/2019   Procedure: ENDARTERECTOMY CAROTID;  Surgeon: Algernon Huxley, MD;  Location: [facility] ORS;  Service: Vascular;  Laterality: Right;   LEFT HEART CATH Bilateral 03/05/2017   Procedure: Left Heart Cath with poss PCI;  Surgeon: Wellington Hampshire, MD;  Location: [place] [facility] LAB;  Service: Cardiovascular;  Laterality: Bilateral;    Medical History: Past Medical History:  Diagnosis Date   AAA (abdominal aortic aneurysm)    a. 05/2016 Abd U/S: 3.02 x 3.02 AAA.   Anxiety    CAD (coronary artery disease)    a. 02/2017 NSTEMI/Cath: LM nl, LAD mild dzs,  D2 min irregs, LCX mild dzs, OM2 50ost, RCA 95p, 112m, L-->R collats, EF 55-65%.   COPD (chronic obstructive pulmonary disease) (HCC)    Diastolic dysfunction    a. 02/2017 Echo: EF 55-60%, gr1 DD, ? inf HK, mild MR.   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 05/2016 ABI's: R 1.22, L 1.11.   Tobacco abuse     Family History: Family History  Problem Relation Age of Onset   Breast cancer Mother    Dementia Mother    Hypertension Mother    Breast cancer Maternal Aunt    Breast cancer Maternal Aunt    Breast cancer Maternal Aunt    CVA Father     Social History   Socioeconomic History   Marital status: Divorced    Spouse name: Not on file   Number of children: Not on file   Years of education: Not on file   Highest education level: Not on file  Occupational History   Not on file  Tobacco Use   Smoking status: Former    Packs/day: 0.25    Types: Cigarettes    Quit date: 07/12/2017    Years since quitting: 3.9   Smokeless tobacco: Never   Tobacco comments:    currently 90 days without smoking  Vaping Use   Vaping Use: Never used  Substance and Sexual Activity   Alcohol use: No   Drug use: No   Sexual activity: Not on file  Other Topics Concern   Not on file  Social History Narrative   Not on file   Social Determinants of Health   Financial Resource Strain: Not on file  Food Insecurity: Not on file  Transportation Needs: Not on file  Physical Activity: Not on file  Stress: Not on file  Social Connections: Not on file  Intimate Partner Violence: Not on file      Review of Systems  Constitutional:  Negative for chills, fatigue and unexpected weight change.  HENT:  Negative for congestion, rhinorrhea, sneezing and sore throat.   Eyes:  Negative for redness.  Respiratory:  Positive for cough (chronic), shortness of  breath (intermittent) and wheezing (intermittent). Negative for chest tightness.   Cardiovascular:  Negative for chest pain and palpitations.  Gastrointestinal:  Negative for abdominal pain, constipation, diarrhea, nausea and vomiting.  Genitourinary:  Negative for dysuria and frequency.  Musculoskeletal:  Positive for arthralgias and back pain. Negative for joint swelling and neck pain.  Skin:  Negative for rash.  Neurological: Negative.  Negative for tremors and numbness.  Hematological:  Negative for adenopathy. Does not bruise/bleed easily.  Psychiatric/Behavioral:  Negative for behavioral problems (Depression), sleep disturbance  and suicidal ideas. The patient is not nervous/anxious.    Vital Signs: BP (!) 157/69   Pulse 89   Temp 98.2 F (36.8 C)   Resp 16   Ht 5' (1.524 m)   Wt 75 lb (34 kg)   SpO2 97%   BMI 14.65 kg/m    Physical Exam Vitals reviewed.  Constitutional:      General: She is awake. She is not in acute distress.    Appearance: Normal appearance. She is well-groomed and underweight. She is not ill-appearing or toxic-appearing.  HENT:     Head: Normocephalic and atraumatic.  Eyes:     Pupils: Pupils are equal, round, and reactive to light.  Cardiovascular:     Rate and Rhythm: Normal rate and regular rhythm.     Pulses: Normal pulses.     Heart sounds: Normal heart sounds. No murmur heard. Pulmonary:     Effort: Pulmonary effort is normal. No respiratory distress.     Breath sounds: Normal breath sounds. No wheezing.  Neurological:     Mental Status: She is alert and oriented to person, place, and time.     Cranial Nerves: No cranial nerve deficit.     Coordination: Coordination normal.     Gait: Gait normal.  Psychiatric:        Mood and Affect: Mood normal.        Behavior: Behavior normal. Behavior is cooperative.       Assessment/Plan: 1. Chronic respiratory failure with hypoxia (HCC) This problem is secondary to COPD  2. COPD with  chronic bronchitis and emphysema ([place]) Plan to order a noninvasive vent will sustain life and reduce hospitalizations  3. Centrilobular emphysema ([place]) She is a longtime smoker and has chronic COPD and respiratory failure she uses supplemental oxygen and a noninvasive vent has been ordered to help reduce hospitalizations.  4. Tremor due to multiple drugs He is on multiple medications that cause a tremor as a side effect  5. Essential hypertension Blood pressure is elevated today but patient reports her BP has been better at home.   6. Oropharyngeal candidiasis Refill ordered - clotrimazole (MYCELEX) 10 MG troche; Take 1 tablet (10 mg total) by mouth 5 (five) times daily.  Dispense: 70 Troche; Refill: 2  General Counseling: Supriya verbalizes understanding of the findings of todays visit and agrees with plan of treatment. I have discussed any further diagnostic evaluation that may be needed or ordered today. We also reviewed her medications today. she has been encouraged to call the office with any questions or concerns that should arise related to todays visit.    No orders of the defined types were placed in this encounter.   Meds ordered this encounter  Medications   clotrimazole (MYCELEX) 10 MG troche    Sig: Take 1 tablet (10 mg total) by mouth 5 (five) times daily.    Dispense:  70 Troche    Refill:  2    Return in about 3 months (around 09/14/2021) for F/U, [place] PCP.   Total time spent:30 Minutes Time spent includes review of chart, medications, test results, and follow up plan with the patient.   [place] Controlled Substance Database was reviewed by me.  This patient was seen by Jonetta Osgood, FNP-C in collaboration with Dr. Clayborn Bigness as a part of collaborative care agreement.   Akiba Melfi R. Valetta Fuller, MSN, FNP-C Internal medicine

## 2021-06-23 ENCOUNTER — Telehealth: Payer: Self-pay

## 2021-06-23 NOTE — Telephone Encounter (Signed)
Verbal order for non-invasive mechanical ventilator signed by Yetta Flock and placed in [place] folder.

## 2021-06-28 NOTE — Telephone Encounter (Signed)
Spoke w/ Kenney Houseman @ office, she verified they received referral. She stated patient can call them to schedule appointment. I gave patient tele [phone]

## 2021-06-29 ENCOUNTER — Encounter: Payer: Self-pay | Admitting: Nurse Practitioner

## 2021-07-04 ENCOUNTER — Other Ambulatory Visit: Payer: Self-pay

## 2021-07-04 ENCOUNTER — Telehealth: Payer: Self-pay

## 2021-07-04 MED ORDER — OSELTAMIVIR PHOSPHATE 75 MG PO CAPS: 75.0000 mg | ORAL_CAPSULE | Freq: Two times a day (BID) | ORAL | 0 refills | Status: DC

## 2021-07-04 NOTE — Telephone Encounter (Signed)
Pt daughter called that pt is exposure to flu and she having flu like symptoms as per alyssa send tamiflu to [facility]

## 2021-07-05 ENCOUNTER — Other Ambulatory Visit: Payer: Self-pay | Admitting: Nurse Practitioner

## 2021-07-05 DIAGNOSIS — F411 Generalized anxiety disorder: Secondary | ICD-10-CM

## 2021-07-05 NOTE — Telephone Encounter (Signed)
Med sent to pharmacy.

## 2021-07-08 ENCOUNTER — Encounter: Payer: Self-pay | Admitting: Nurse Practitioner

## 2021-07-08 ENCOUNTER — Telehealth (INDEPENDENT_AMBULATORY_CARE_PROVIDER_SITE_OTHER): Payer: Medicare Other | Admitting: Nurse Practitioner

## 2021-07-08 ENCOUNTER — Telehealth: Payer: Self-pay

## 2021-07-08 VITALS — Resp 16

## 2021-07-08 DIAGNOSIS — J0110 Acute frontal sinusitis, unspecified: Secondary | ICD-10-CM | POA: Diagnosis not present

## 2021-07-08 DIAGNOSIS — J440 Chronic obstructive pulmonary disease with acute lower respiratory infection: Secondary | ICD-10-CM

## 2021-07-08 DIAGNOSIS — J209 Acute bronchitis, unspecified: Secondary | ICD-10-CM

## 2021-07-08 MED ORDER — PREDNISONE 10 MG PO TABS: ORAL_TABLET | ORAL | 0 refills | Status: DC

## 2021-07-08 MED ORDER — AMOXICILLIN-POT CLAVULANATE 875-125 MG PO TABS: 1.0000 | ORAL_TABLET | Freq: Two times a day (BID) | ORAL | 0 refills | Status: DC

## 2021-07-08 NOTE — Progress Notes (Signed)
[facility] [place], [place] [address]  Internal MEDICINE  Telephone Visit  Patient Name: Christina Hester  [id number]  [id number]  Date of Service: 07/08/2021  I connected with the patient at 11:15 AM by telephone and verified the patients identity using two identifiers.   I discussed the limitations, risks, security and privacy concerns of performing an evaluation and management service by telephone and the availability of in person appointments. I also discussed with the patient that there may be a patient responsible charge related to the service.  The patient expressed understanding and agrees to proceed.    Chief Complaint  Patient presents with   Telephone Screen   Telephone Assessment   Breathing Problem    Raspy, chest is a little tight; wants to be seen as a precaution bc daughter is sick    HPI Francetta presents for a telehealth virtual visit for symptoms of sinusitis and bronchitis with COPD. She reports hoarseness, chest tightness and SOB. Her daughter who lives with her is also sick.    Current Medication: Outpatient Encounter Medications as of 07/08/2021  Medication Sig   amLODipine (NORVASC) 2.5 MG tablet Take 1 tablet (2.5 mg total) by mouth daily. Take along with 5 mg daily for total of 7.5 mg.   amLODipine (NORVASC) 5 MG tablet Take 1 tablet (5 mg total) by mouth daily. Take along with 2.5 mg for a total of 7.5 mg.   amoxicillin-clavulanate (AUGMENTIN) 875-125 MG tablet Take 1 tablet by mouth 2 (two) times daily.   Budeson-Glycopyrrol-Formoterol (BREZTRI AEROSPHERE) 160-9-4.8 MCG/ACT AERO Inhale 2 puffs into the lungs 2 (two) times daily.   buPROPion (WELLBUTRIN) 100 MG tablet Take 1 tablet (100 mg total) by mouth daily.   clopidogrel (PLAVIX) 75 MG tablet Take 1 tablet (75 mg total) by mouth daily.   clotrimazole (MYCELEX) 10 MG troche Take 1 tablet (10 mg total) by mouth 5 (five) times daily.   ipratropium-albuterol (DUONEB) 0.5-2.5 (3)  MG/3ML SOLN INHALE THE CONTENTS OF 1 VIAL (3 ML) VIANEBULIZER EVERY 4 HOURS   levofloxacin (LEVAQUIN) 750 MG tablet Take 1 tablet (750 mg total) by mouth daily.   LORazepam (ATIVAN) 0.5 MG tablet TAKE 1/2 TO 1 TABLET BY MOUTH EVERY 8 HOURS AS NEEDED FOR ANXIETY OR SLEEP   meloxicam (MOBIC) 15 MG tablet Take 1 tablet (15 mg total) by mouth daily.   nitroGLYCERIN (NITROSTAT) 0.4 MG SL tablet Place 1 tablet (0.4 mg total) under the tongue every 5 (five) minutes as needed for chest pain.   nystatin (MYCOSTATIN) 100000 UNIT/ML suspension Take 5 mLs (500,000 Units total) by mouth 4 (four) times daily.   oseltamivir (TAMIFLU) 75 MG capsule Take 1 capsule (75 mg total) by mouth 2 (two) times daily.   predniSONE (DELTASONE) 10 MG tablet Take one tab 3 x day for 3 days, then take one tab 2 x a day for 3 days and then take one tab a day for 3 days for copd   VENTOLIN HFA 108 (90 Base) MCG/ACT inhaler INHALE 2 PUFFS INTO THE LUNGS EVERY 6 HOURS AS NEEDED FOR WHEEZING OR SHORTNESS OF BREATH.   No facility-administered encounter medications on file as of 07/08/2021.    Surgical History: Past Surgical History:  Procedure Laterality Date   ABDOMINAL HYSTERECTOMY     APPENDECTOMY     BREAST EXCISIONAL BIOPSY Left 80's or 90's   NEG   CARDIAC CATHETERIZATION     COLONOSCOPY N/A 05/22/2018   Procedure: COLONOSCOPY;  Surgeon: Lin Landsman, MD;  Location: [facility] ENDOSCOPY;  Service: Gastroenterology;  Laterality: N/A;   COLONOSCOPY WITH PROPOFOL N/A 07/19/2020   Procedure: COLONOSCOPY WITH PROPOFOL;  Surgeon: Toledo, Benay Pike, MD;  Location: [facility] ENDOSCOPY;  Service: Gastroenterology;  Laterality: N/A;   ENDARTERECTOMY Right 11/06/2019   Procedure: ENDARTERECTOMY CAROTID;  Surgeon: Algernon Huxley, MD;  Location: [facility] ORS;  Service: Vascular;  Laterality: Right;   LEFT HEART CATH Bilateral 03/05/2017   Procedure: Left Heart Cath with poss PCI;  Surgeon: Wellington Hampshire, MD;  Location: [facility] CV LAB;   Service: Cardiovascular;  Laterality: Bilateral;    Medical History: Past Medical History:  Diagnosis Date   AAA (abdominal aortic aneurysm)    a. 05/2016 Abd U/S: 3.02 x 3.02 AAA.   Anxiety    CAD (coronary artery disease)    a. 02/2017 NSTEMI/Cath: LM nl, LAD mild dzs, D2 min irregs, LCX mild dzs, OM2 50ost, RCA 95p, 155m, L-->R collats, EF 55-65%.   COPD (chronic obstructive pulmonary disease) (HCC)    Diastolic dysfunction    a. 02/2017 Echo: EF 55-60%, gr1 DD, ? inf HK, mild MR.   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 05/2016 ABI's: R 1.22, L 1.11.   Tobacco abuse     Family History: Family History  Problem Relation Age of Onset   Breast cancer Mother    Dementia Mother    Hypertension Mother    Breast cancer Maternal Aunt    Breast cancer Maternal Aunt    Breast cancer Maternal Aunt    CVA Father     Social History   Socioeconomic History   Marital status: Divorced    Spouse name: Not on file   Number of children: Not on file   Years of education: Not on file   Highest education level: Not on file  Occupational History   Not on file  Tobacco Use   Smoking status: Former    Packs/day: 0.25    Types: Cigarettes    Quit date: 07/12/2017    Years since quitting: 3.9   Smokeless tobacco: Never   Tobacco comments:    currently 90 days without smoking  Vaping Use   Vaping Use: Never used  Substance and Sexual Activity   Alcohol use: No   Drug use: No   Sexual activity: Not on file  Other Topics Concern   Not on file  Social History Narrative   Not on file   Social Determinants of Health   Financial Resource Strain: Not on file  Food Insecurity: Not on file  Transportation Needs: Not on file  Physical Activity: Not on file  Stress: Not on file  Social Connections: Not on file  Intimate Partner Violence:  Not on file      Review of Systems  Constitutional:  Negative for chills, fatigue and fever.  HENT:  Negative for congestion, ear pain, postnasal drip, rhinorrhea, sinus pressure, sinus pain, sneezing and sore throat.   Respiratory:  Positive for cough, chest tightness, shortness of breath and wheezing.   Cardiovascular: Negative.  Negative for chest pain and palpitations.  Gastrointestinal: Negative.  Negative for abdominal pain, constipation, diarrhea, nausea and vomiting.  Musculoskeletal:  Negative for myalgias.  Skin:  Negative for rash.   Vital Signs:  Resp 16    SpO2 93%    Observation/Objective: She is alert and oriented and engages in conversation appropriately. She does no sound as though she is in any acute distress over telephone call.     Assessment/Plan: 1. Acute bronchitis with COPD ([place]) Empiric antibiotic treatment prescribed and a prednisone taper due to her COPD  - amoxicillin-clavulanate (AUGMENTIN) 875-125 MG tablet; Take 1 tablet by mouth 2 (two) times daily.  Dispense: 20 tablet; Refill: 0 - predniSONE (DELTASONE) 10 MG tablet; Take one tab 3 x day for 3 days, then take one tab 2 x a day for 3 days and then take one tab a day for 3 days for copd  Dispense: 18 tablet; Refill: 0  2. Acute non-recurrent frontal sinusitis See problem #1 - amoxicillin-clavulanate (AUGMENTIN) 875-125 MG tablet; Take 1 tablet by mouth 2 (two) times daily.  Dispense: 20 tablet; Refill: 0   General Counseling: Paislea verbalizes understanding of the findings of today's phone visit and agrees with plan of treatment. I have discussed any further diagnostic evaluation that may be needed or ordered today. We also reviewed her medications today. she has been encouraged to call the office with any questions or concerns that should arise related to todays visit.  Return if symptoms worsen or fail to improve.   No orders of the defined types were placed in this encounter.   Meds ordered  this encounter  Medications   amoxicillin-clavulanate (AUGMENTIN) 875-125 MG tablet    Sig: Take 1 tablet by mouth 2 (two) times daily.    Dispense:  20 tablet    Refill:  0   predniSONE (DELTASONE) 10 MG tablet    Sig: Take one tab 3 x day for 3 days, then take one tab 2 x a day for 3 days and then take one tab a day for 3 days for copd    Dispense:  18 tablet    Refill:  0    Time spent:10 Minutes Time spent with patient included reviewing progress notes, labs, imaging studies, and discussing plan for follow up.  [place] Controlled Substance Database was reviewed by me for overdose risk score (ORS) if appropriate.  This patient was seen by Jonetta Osgood, FNP-C in collaboration with Dr. Clayborn Bigness as a part of collaborative care agreement.  Shantel Helwig R. Valetta Fuller, MSN, FNP-C Internal medicine

## 2021-07-08 NOTE — Telephone Encounter (Signed)
Pt was seen

## 2021-07-18 DIAGNOSIS — M9904 Segmental and somatic dysfunction of sacral region: Secondary | ICD-10-CM | POA: Diagnosis not present

## 2021-07-18 DIAGNOSIS — M9901 Segmental and somatic dysfunction of cervical region: Secondary | ICD-10-CM | POA: Diagnosis not present

## 2021-07-18 DIAGNOSIS — M5441 Lumbago with sciatica, right side: Secondary | ICD-10-CM | POA: Diagnosis not present

## 2021-07-18 DIAGNOSIS — H81393 Other peripheral vertigo, bilateral: Secondary | ICD-10-CM | POA: Diagnosis not present

## 2021-07-18 DIAGNOSIS — R208 Other disturbances of skin sensation: Secondary | ICD-10-CM | POA: Diagnosis not present

## 2021-07-18 DIAGNOSIS — M9903 Segmental and somatic dysfunction of lumbar region: Secondary | ICD-10-CM | POA: Diagnosis not present

## 2021-07-19 ENCOUNTER — Ambulatory Visit (INDEPENDENT_AMBULATORY_CARE_PROVIDER_SITE_OTHER): Payer: Medicare Other | Admitting: Nurse Practitioner

## 2021-07-19 ENCOUNTER — Other Ambulatory Visit: Payer: Self-pay

## 2021-07-19 ENCOUNTER — Encounter: Payer: Self-pay | Admitting: Nurse Practitioner

## 2021-07-19 VITALS — BP 140/81 | HR 61 | Temp 98.3°F | Resp 16 | Ht 60.0 in | Wt 73.8 lb

## 2021-07-19 DIAGNOSIS — J432 Centrilobular emphysema: Secondary | ICD-10-CM | POA: Diagnosis not present

## 2021-07-19 DIAGNOSIS — I10 Essential (primary) hypertension: Secondary | ICD-10-CM | POA: Diagnosis not present

## 2021-07-19 DIAGNOSIS — F411 Generalized anxiety disorder: Secondary | ICD-10-CM | POA: Diagnosis not present

## 2021-07-19 DIAGNOSIS — Z789 Other specified health status: Secondary | ICD-10-CM | POA: Diagnosis not present

## 2021-07-19 DIAGNOSIS — F5101 Primary insomnia: Secondary | ICD-10-CM | POA: Diagnosis not present

## 2021-07-19 DIAGNOSIS — Z681 Body mass index (BMI) 19 or less, adult: Secondary | ICD-10-CM

## 2021-07-19 MED ORDER — MIRTAZAPINE 7.5 MG PO TABS: 7.5000 mg | ORAL_TABLET | Freq: Every day | ORAL | 2 refills | Status: DC

## 2021-07-19 NOTE — Progress Notes (Signed)
[facility] [place], [place] [address]  Internal MEDICINE  Office Visit Note  Patient Name: Christina Hester  [id number]  [id number]  Date of Service: 07/19/2021  Chief Complaint  Patient presents with   Follow-up    Would like to discuss Welbutrin   Hypertension   Hyperlipidemia   Anxiety   COPD    HPI Christina Hester presents for a follow up to discuss switching her antidepressant. She has been on bupropion for some time. We previously discussed switching to mirtazapine at another office visit due to her being underweight and having difficulty sleeping. She is open to switching to mirtazapine.  She is benefiting from breztri and it is helping her breathing. She is using her rescue inhaler less. The medication is expensive costing her over $100 each month, sometimes more than $200. She has not yet been seen by the pharmacist for CCM.    Current Medication: Outpatient Encounter Medications as of 07/19/2021  Medication Sig   amLODipine (NORVASC) 2.5 MG tablet Take 1 tablet (2.5 mg total) by mouth daily. Take along with 5 mg daily for total of 7.5 mg.   amLODipine (NORVASC) 5 MG tablet Take 1 tablet (5 mg total) by mouth daily. Take along with 2.5 mg for a total of 7.5 mg.   amoxicillin-clavulanate (AUGMENTIN) 875-125 MG tablet Take 1 tablet by mouth 2 (two) times daily.   Budeson-Glycopyrrol-Formoterol (BREZTRI AEROSPHERE) 160-9-4.8 MCG/ACT AERO Inhale 2 puffs into the lungs 2 (two) times daily.   buPROPion (WELLBUTRIN) 100 MG tablet Take 1 tablet (100 mg total) by mouth daily.   clopidogrel (PLAVIX) 75 MG tablet Take 1 tablet (75 mg total) by mouth daily.   clotrimazole (MYCELEX) 10 MG troche Take 1 tablet (10 mg total) by mouth 5 (five) times daily.   ipratropium-albuterol (DUONEB) 0.5-2.5 (3) MG/3ML SOLN INHALE THE CONTENTS OF 1 VIAL (3 ML) VIANEBULIZER EVERY 4 HOURS   levofloxacin (LEVAQUIN) 750 MG tablet Take 1 tablet (750 mg total) by mouth daily.   LORazepam  (ATIVAN) 0.5 MG tablet TAKE 1/2 TO 1 TABLET BY MOUTH EVERY 8 HOURS AS NEEDED FOR ANXIETY OR SLEEP   meloxicam (MOBIC) 15 MG tablet Take 1 tablet (15 mg total) by mouth daily.   mirtazapine (REMERON) 7.5 MG tablet Take 1 tablet (7.5 mg total) by mouth at bedtime.   nitroGLYCERIN (NITROSTAT) 0.4 MG SL tablet Place 1 tablet (0.4 mg total) under the tongue every 5 (five) minutes as needed for chest pain.   nystatin (MYCOSTATIN) 100000 UNIT/ML suspension Take 5 mLs (500,000 Units total) by mouth 4 (four) times daily.   oseltamivir (TAMIFLU) 75 MG capsule Take 1 capsule (75 mg total) by mouth 2 (two) times daily.   predniSONE (DELTASONE) 10 MG tablet Take one tab 3 x day for 3 days, then take one tab 2 x a day for 3 days and then take one tab a day for 3 days for copd   VENTOLIN HFA 108 (90 Base) MCG/ACT inhaler INHALE 2 PUFFS INTO THE LUNGS EVERY 6 HOURS AS NEEDED FOR WHEEZING OR SHORTNESS OF BREATH.   No facility-administered encounter medications on file as of 07/19/2021.    Surgical History: Past Surgical History:  Procedure Laterality Date   ABDOMINAL HYSTERECTOMY     APPENDECTOMY     BREAST EXCISIONAL BIOPSY Left 80's or 90's   NEG   CARDIAC CATHETERIZATION     COLONOSCOPY N/A 05/22/2018   Procedure: COLONOSCOPY;  Surgeon: Lin Landsman, MD;  Location: [facility]  ENDOSCOPY;  Service: Gastroenterology;  Laterality: N/A;   COLONOSCOPY WITH PROPOFOL N/A 07/19/2020   Procedure: COLONOSCOPY WITH PROPOFOL;  Surgeon: Toledo, Benay Pike, MD;  Location: [facility] ENDOSCOPY;  Service: Gastroenterology;  Laterality: N/A;   ENDARTERECTOMY Right 11/06/2019   Procedure: ENDARTERECTOMY CAROTID;  Surgeon: Algernon Huxley, MD;  Location: [facility] ORS;  Service: Vascular;  Laterality: Right;   LEFT HEART CATH Bilateral 03/05/2017   Procedure: Left Heart Cath with poss PCI;  Surgeon: Wellington Hampshire, MD;  Location: [facility] CV LAB;  Service: Cardiovascular;  Laterality: Bilateral;    Medical History: Past Medical  History:  Diagnosis Date   AAA (abdominal aortic aneurysm)    a. 05/2016 Abd U/S: 3.02 x 3.02 AAA.   Anxiety    CAD (coronary artery disease)    a. 02/2017 NSTEMI/Cath: LM nl, LAD mild dzs, D2 min irregs, LCX mild dzs, OM2 50ost, RCA 95p, 173m, L-->R collats, EF 55-65%.   COPD (chronic obstructive pulmonary disease) (HCC)    Diastolic dysfunction    a. 02/2017 Echo: EF 55-60%, gr1 DD, ? inf HK, mild MR.   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 05/2016 ABI's: R 1.22, L 1.11.   Tobacco abuse     Family History: Family History  Problem Relation Age of Onset   Breast cancer Mother    Dementia Mother    Hypertension Mother    Breast cancer Maternal Aunt    Breast cancer Maternal Aunt    Breast cancer Maternal Aunt    CVA Father     Social History   Socioeconomic History   Marital status: Divorced    Spouse name: Not on file   Number of children: Not on file   Years of education: Not on file   Highest education level: Not on file  Occupational History   Not on file  Tobacco Use   Smoking status: Former    Packs/day: 0.25    Types: Cigarettes    Quit date: 07/12/2017    Years since quitting: 4.0   Smokeless tobacco: Never   Tobacco comments:    currently 90 days without smoking  Vaping Use   Vaping Use: Never used  Substance and Sexual Activity   Alcohol use: No   Drug use: No   Sexual activity: Not on file  Other Topics Concern   Not on file  Social History Narrative   Not on file   Social Determinants of Health   Financial Resource Strain: Not on file  Food Insecurity: Not on file  Transportation Needs: Not on file  Physical Activity: Not on file  Stress: Not on file  Social Connections: Not on file  Intimate Partner Violence: Not on file      Review of Systems  Constitutional:  Positive for unexpected  weight change. Negative for chills and fatigue.  HENT:  Negative for congestion, rhinorrhea, sneezing and sore throat.   Eyes:  Negative for redness.  Respiratory:  Positive for shortness of breath (improving with breztri) and wheezing (improving with breztri, none today). Negative for cough and chest tightness.   Cardiovascular:  Negative for chest pain and palpitations.  Gastrointestinal:  Negative for abdominal pain, constipation, diarrhea, nausea and vomiting.  Genitourinary:  Negative for dysuria and frequency.  Musculoskeletal:  Negative for  arthralgias, back pain, joint swelling and neck pain.  Skin:  Negative for rash.  Neurological: Negative.  Negative for tremors and numbness.  Hematological:  Negative for adenopathy. Does not bruise/bleed easily.  Psychiatric/Behavioral:  Negative for behavioral problems (Depression), sleep disturbance and suicidal ideas. The patient is not nervous/anxious.    Vital Signs: BP 140/81    Pulse 61 Comment: 46   Temp 98.3 F (36.8 C)    Resp 16    Wt 73 lb 12.8 oz (33.5 kg)    SpO2 92%    BMI 14.41 kg/m    Physical Exam Vitals reviewed.  Constitutional:      General: She is not in acute distress.    Appearance: Normal appearance. She is underweight. She is not ill-appearing.  HENT:     Head: Normocephalic and atraumatic.  Eyes:     Pupils: Pupils are equal, round, and reactive to light.  Cardiovascular:     Rate and Rhythm: Normal rate and regular rhythm.  Pulmonary:     Effort: Pulmonary effort is normal. No respiratory distress.     Breath sounds: Normal breath sounds.  Neurological:     Mental Status: She is alert and oriented to person, place, and time.     Cranial Nerves: No cranial nerve deficit.     Coordination: Coordination normal.     Gait: Gait normal.  Psychiatric:        Mood and Affect: Mood normal.        Behavior: Behavior normal.       Assessment/Plan: 1. Centrilobular emphysema ([place]) Patient is taking breztri  but it is expensive. Referred to CCM for further assistance with medication management and to help find financial help for the patient with breztri.   2. Essential hypertension Blood pressure is stable at this time. Continue medications as prescribed.   3. On complex medication regimen On several chronic medications and has complex medical problems, refer for CCM with the pharmacist. Need help finding financial help for breztri - CCM pharmacy monitoring  4. Generalized anxiety disorder Tapering off bupropion and starting mirtazapine.  - mirtazapine (REMERON) 7.5 MG tablet; Take 1 tablet (7.5 mg total) by mouth at bedtime.  Dispense: 30 tablet; Refill: 2  5. Primary insomnia Tapering off bupropion and starting mirtazapine which may help improve her sleep - mirtazapine (REMERON) 7.5 MG tablet; Take 1 tablet (7.5 mg total) by mouth at bedtime.  Dispense: 30 tablet; Refill: 2  6. Body mass index (BMI) less than 16.5 BMI is 14.4 today and weight is 73 lbs. Patient will taper off bupropion and start mirtazapine which may help her gain weight.  - mirtazapine (REMERON) 7.5 MG tablet; Take 1 tablet (7.5 mg total) by mouth at bedtime.  Dispense: 30 tablet; Refill: 2   General Counseling: Kynslei verbalizes understanding of the findings of todays visit and agrees with plan of treatment. I have discussed any further diagnostic evaluation that may be needed or ordered today. We also reviewed her medications today. she has been encouraged to call the office with any questions or concerns that should arise related to todays visit.    Orders Placed This Encounter  Procedures   CCM pharmacy monitoring    Meds ordered this encounter  Medications   mirtazapine (REMERON) 7.5 MG tablet    Sig: Take 1 tablet (7.5 mg total) by mouth at bedtime.    Dispense:  30 tablet    Refill:  2    Return in about 2 months (  around 09/16/2021) for F/U, eval new med, [facility] PCP.   Total time spent:30 Minutes Time  spent includes review of chart, medications, test results, and follow up plan with the patient.   [place] Controlled Substance Database was reviewed by me.  This patient was seen by Jonetta Osgood, FNP-C in collaboration with Dr. Clayborn Bigness as a part of collaborative care agreement.   Kaysia Willard R. Valetta Fuller, MSN, FNP-C Internal medicine

## 2021-07-22 DIAGNOSIS — M9904 Segmental and somatic dysfunction of sacral region: Secondary | ICD-10-CM | POA: Diagnosis not present

## 2021-07-22 DIAGNOSIS — H81393 Other peripheral vertigo, bilateral: Secondary | ICD-10-CM | POA: Diagnosis not present

## 2021-07-22 DIAGNOSIS — M5441 Lumbago with sciatica, right side: Secondary | ICD-10-CM | POA: Diagnosis not present

## 2021-07-22 DIAGNOSIS — M9901 Segmental and somatic dysfunction of cervical region: Secondary | ICD-10-CM | POA: Diagnosis not present

## 2021-07-22 DIAGNOSIS — R208 Other disturbances of skin sensation: Secondary | ICD-10-CM | POA: Diagnosis not present

## 2021-07-22 DIAGNOSIS — M9903 Segmental and somatic dysfunction of lumbar region: Secondary | ICD-10-CM | POA: Diagnosis not present

## 2021-07-25 DIAGNOSIS — R208 Other disturbances of skin sensation: Secondary | ICD-10-CM | POA: Diagnosis not present

## 2021-07-25 DIAGNOSIS — M9904 Segmental and somatic dysfunction of sacral region: Secondary | ICD-10-CM | POA: Diagnosis not present

## 2021-07-25 DIAGNOSIS — M5441 Lumbago with sciatica, right side: Secondary | ICD-10-CM | POA: Diagnosis not present

## 2021-07-25 DIAGNOSIS — M9901 Segmental and somatic dysfunction of cervical region: Secondary | ICD-10-CM | POA: Diagnosis not present

## 2021-07-25 DIAGNOSIS — M9903 Segmental and somatic dysfunction of lumbar region: Secondary | ICD-10-CM | POA: Diagnosis not present

## 2021-07-25 DIAGNOSIS — H81393 Other peripheral vertigo, bilateral: Secondary | ICD-10-CM | POA: Diagnosis not present

## 2021-07-29 DIAGNOSIS — M5441 Lumbago with sciatica, right side: Secondary | ICD-10-CM | POA: Diagnosis not present

## 2021-07-29 DIAGNOSIS — M9903 Segmental and somatic dysfunction of lumbar region: Secondary | ICD-10-CM | POA: Diagnosis not present

## 2021-07-29 DIAGNOSIS — M9904 Segmental and somatic dysfunction of sacral region: Secondary | ICD-10-CM | POA: Diagnosis not present

## 2021-07-29 DIAGNOSIS — H81393 Other peripheral vertigo, bilateral: Secondary | ICD-10-CM | POA: Diagnosis not present

## 2021-07-29 DIAGNOSIS — M9901 Segmental and somatic dysfunction of cervical region: Secondary | ICD-10-CM | POA: Diagnosis not present

## 2021-07-29 DIAGNOSIS — R208 Other disturbances of skin sensation: Secondary | ICD-10-CM | POA: Diagnosis not present

## 2021-08-01 ENCOUNTER — Other Ambulatory Visit: Payer: Self-pay | Admitting: Nurse Practitioner

## 2021-08-01 DIAGNOSIS — H81393 Other peripheral vertigo, bilateral: Secondary | ICD-10-CM | POA: Diagnosis not present

## 2021-08-01 DIAGNOSIS — M9903 Segmental and somatic dysfunction of lumbar region: Secondary | ICD-10-CM | POA: Diagnosis not present

## 2021-08-01 DIAGNOSIS — M9904 Segmental and somatic dysfunction of sacral region: Secondary | ICD-10-CM | POA: Diagnosis not present

## 2021-08-01 DIAGNOSIS — M9901 Segmental and somatic dysfunction of cervical region: Secondary | ICD-10-CM | POA: Diagnosis not present

## 2021-08-01 DIAGNOSIS — F411 Generalized anxiety disorder: Secondary | ICD-10-CM

## 2021-08-01 DIAGNOSIS — R208 Other disturbances of skin sensation: Secondary | ICD-10-CM | POA: Diagnosis not present

## 2021-08-01 DIAGNOSIS — M5441 Lumbago with sciatica, right side: Secondary | ICD-10-CM | POA: Diagnosis not present

## 2021-08-05 DIAGNOSIS — M9904 Segmental and somatic dysfunction of sacral region: Secondary | ICD-10-CM | POA: Diagnosis not present

## 2021-08-05 DIAGNOSIS — R208 Other disturbances of skin sensation: Secondary | ICD-10-CM | POA: Diagnosis not present

## 2021-08-05 DIAGNOSIS — M9903 Segmental and somatic dysfunction of lumbar region: Secondary | ICD-10-CM | POA: Diagnosis not present

## 2021-08-05 DIAGNOSIS — M5441 Lumbago with sciatica, right side: Secondary | ICD-10-CM | POA: Diagnosis not present

## 2021-08-05 DIAGNOSIS — M9901 Segmental and somatic dysfunction of cervical region: Secondary | ICD-10-CM | POA: Diagnosis not present

## 2021-08-05 DIAGNOSIS — H81393 Other peripheral vertigo, bilateral: Secondary | ICD-10-CM | POA: Diagnosis not present

## 2021-08-10 ENCOUNTER — Other Ambulatory Visit: Payer: Self-pay | Admitting: Internal Medicine

## 2021-08-10 DIAGNOSIS — I6523 Occlusion and stenosis of bilateral carotid arteries: Secondary | ICD-10-CM

## 2021-08-10 DIAGNOSIS — Z0001 Encounter for general adult medical examination with abnormal findings: Secondary | ICD-10-CM

## 2021-08-10 DIAGNOSIS — F411 Generalized anxiety disorder: Secondary | ICD-10-CM

## 2021-08-10 DIAGNOSIS — I10 Essential (primary) hypertension: Secondary | ICD-10-CM

## 2021-08-10 DIAGNOSIS — I200 Unstable angina: Secondary | ICD-10-CM

## 2021-08-10 DIAGNOSIS — J449 Chronic obstructive pulmonary disease, unspecified: Secondary | ICD-10-CM

## 2021-08-10 DIAGNOSIS — M2550 Pain in unspecified joint: Secondary | ICD-10-CM

## 2021-08-10 DIAGNOSIS — R300 Dysuria: Secondary | ICD-10-CM

## 2021-08-12 ENCOUNTER — Telehealth: Payer: Self-pay

## 2021-08-12 NOTE — Telephone Encounter (Signed)
Pt's daughter called and was concerned about pt and advised she was having breathing issues, using nebulizer 5-6 times a day, her rescue inhalers and being very tired and drained, she asked if we could call to check on pt.  I called and spoke to pt and she stated she wasn't doing real good and states her cough she can't really bring anything up like she usually can and the her breztri wasn't working well even after trying to clean with hot water.  I informed pt the if it gets any worse thru the weekend that she needs to go to ER or an urgent care.  Pt promised she would do that.  I also made pt a acute appt for Monday 08/15/21.  Pt will do a covid test and if positive she will call the on call phone.

## 2021-08-15 ENCOUNTER — Ambulatory Visit (INDEPENDENT_AMBULATORY_CARE_PROVIDER_SITE_OTHER): Payer: Medicare Other | Admitting: Nurse Practitioner

## 2021-08-15 ENCOUNTER — Other Ambulatory Visit: Payer: Self-pay

## 2021-08-15 ENCOUNTER — Ambulatory Visit
Admission: RE | Admit: 2021-08-15 | Discharge: 2021-08-15 | Disposition: A | Discharge status: Home / Self Care | Payer: Medicare Other | Source: Ambulatory Visit | Attending: Nurse Practitioner | Admitting: Nurse Practitioner

## 2021-08-15 ENCOUNTER — Encounter: Payer: Self-pay | Admitting: Nurse Practitioner

## 2021-08-15 ENCOUNTER — Ambulatory Visit
Admission: RE | Admit: 2021-08-15 | Discharge: 2021-08-15 | Disposition: A | Discharge status: Home / Self Care | Payer: Medicare Other | Attending: Nurse Practitioner | Admitting: Nurse Practitioner

## 2021-08-15 VITALS — BP 140/72 | HR 93 | Temp 99.3°F | Resp 16 | Ht 60.0 in | Wt 73.2 lb

## 2021-08-15 DIAGNOSIS — R062 Wheezing: Secondary | ICD-10-CM | POA: Diagnosis not present

## 2021-08-15 DIAGNOSIS — J440 Chronic obstructive pulmonary disease with acute lower respiratory infection: Secondary | ICD-10-CM

## 2021-08-15 DIAGNOSIS — J441 Chronic obstructive pulmonary disease with (acute) exacerbation: Secondary | ICD-10-CM | POA: Diagnosis not present

## 2021-08-15 DIAGNOSIS — J209 Acute bronchitis, unspecified: Secondary | ICD-10-CM | POA: Diagnosis not present

## 2021-08-15 DIAGNOSIS — J439 Emphysema, unspecified: Secondary | ICD-10-CM | POA: Diagnosis not present

## 2021-08-15 DIAGNOSIS — J432 Centrilobular emphysema: Secondary | ICD-10-CM | POA: Diagnosis not present

## 2021-08-15 DIAGNOSIS — R0602 Shortness of breath: Secondary | ICD-10-CM | POA: Diagnosis not present

## 2021-08-15 MED ORDER — PNEUMOCOCCAL 20-VAL CONJ VACC 0.5 ML IM SUSY: 0.5000 mL | PREFILLED_SYRINGE | INTRAMUSCULAR | 0 refills | Status: AC

## 2021-08-15 MED ORDER — PREDNISONE 10 MG PO TABS
ORAL_TABLET | ORAL | 0 refills | Status: DC
Start: 2021-08-15 — End: 2021-10-03

## 2021-08-15 MED ORDER — LEVOFLOXACIN 750 MG PO TABS: 750.0000 mg | ORAL_TABLET | Freq: Every day | ORAL | 0 refills | Status: AC

## 2021-08-15 NOTE — Progress Notes (Signed)
[facility] [place], [place] [address]  Internal MEDICINE  Office Visit Note  Patient Name: Christina Hester  [id number]  [id number]  Date of Service: 08/15/2021  Chief Complaint  Patient presents with   Acute Visit    Breathing issues, sleeping a lot, lethargic, discuss meds      HPI Christina Hester presents for an acute sick visit for shortness of breath, somnolence, low energy. She recently finished tapering off wellbutrin and wants to know if she can start mirtazapine.  She tried the noninvasive vent supplied by [employer] but the mask increased her anxiety and made her feel claustrophobic. She has had COPD exacerbation and pneumonia in the past and her daughter who lives with her was diagnosed and treated for pneumonia recently.    Current Medication:  Outpatient Encounter Medications as of 08/15/2021  Medication Sig   amLODipine (NORVASC) 2.5 MG tablet Take 1 tablet (2.5 mg total) by mouth daily. Take along with 5 mg daily for total of 7.5 mg.   amLODipine (NORVASC) 5 MG tablet Take 1 tablet (5 mg total) by mouth daily. Take along with 2.5 mg for a total of 7.5 mg.   Budeson-Glycopyrrol-Formoterol (BREZTRI AEROSPHERE) 160-9-4.8 MCG/ACT AERO Inhale 2 puffs into the lungs 2 (two) times daily.   buPROPion (WELLBUTRIN) 100 MG tablet Take 1 tablet (100 mg total) by mouth daily.   clopidogrel (PLAVIX) 75 MG tablet Take 1 tablet (75 mg total) by mouth daily.   clotrimazole (MYCELEX) 10 MG troche Take 1 tablet (10 mg total) by mouth 5 (five) times daily.   ipratropium-albuterol (DUONEB) 0.5-2.5 (3) MG/3ML SOLN USE 1 VIAL IN NEBULIZER EVERY 4 HOURS   levofloxacin (LEVAQUIN) 750 MG tablet Take 1 tablet (750 mg total) by mouth daily for 7 days.   LORazepam (ATIVAN) 0.5 MG tablet TAKE 1/2 TO 1 TABLET BY MOUTH EVERY 8 HOURS AS NEEDED FOR ANXIETY OR SLEEP   meloxicam (MOBIC) 15 MG tablet Take 1 tablet (15 mg total) by mouth daily.   mirtazapine (REMERON) 7.5 MG tablet Take 1  tablet (7.5 mg total) by mouth at bedtime.   nitroGLYCERIN (NITROSTAT) 0.4 MG SL tablet Place 1 tablet (0.4 mg total) under the tongue every 5 (five) minutes as needed for chest pain.   nystatin (MYCOSTATIN) 100000 UNIT/ML suspension Take 5 mLs (500,000 Units total) by mouth 4 (four) times daily.   oseltamivir (TAMIFLU) 75 MG capsule Take 1 capsule (75 mg total) by mouth 2 (two) times daily.   pneumococcal 20-valent conjugate vaccine (PREVNAR 20) 0.5 ML injection Inject 0.5 mLs into the muscle tomorrow at 10 am for 1 dose.   predniSONE (DELTASONE) 10 MG tablet Take one tab 3 x day for 3 days, then take one tab 2 x a day for 3 days and then take one tab a day for 3 days for copd   VENTOLIN HFA 108 (90 Base) MCG/ACT inhaler INHALE 2 PUFFS INTO THE LUNGS EVERY 6 HOURS AS NEEDED FOR WHEEZING OR SHORTNESS OF BREATH.   [DISCONTINUED] levofloxacin (LEVAQUIN) 750 MG tablet Take 1 tablet (750 mg total) by mouth daily.   [DISCONTINUED] amoxicillin-clavulanate (AUGMENTIN) 875-125 MG tablet Take 1 tablet by mouth 2 (two) times daily. (Patient not taking: Reported on 08/15/2021)   [DISCONTINUED] predniSONE (DELTASONE) 10 MG tablet Take one tab 3 x day for 3 days, then take one tab 2 x a day for 3 days and then take one tab a day for 3 days for copd (Patient not taking: Reported  on 08/15/2021)   No facility-administered encounter medications on file as of 08/15/2021.      Medical History: Past Medical History:  Diagnosis Date   AAA (abdominal aortic aneurysm)    a. 05/2016 Abd U/S: 3.02 x 3.02 AAA.   Anxiety    CAD (coronary artery disease)    a. 02/2017 NSTEMI/Cath: LM nl, LAD mild dzs, D2 min irregs, LCX mild dzs, OM2 50ost, RCA 95p, 141m, L-->R collats, EF 55-65%.   COPD (chronic obstructive pulmonary disease) (HCC)    Diastolic dysfunction    a. 02/2017 Echo: EF 55-60%, gr1 DD, ? inf HK, mild MR.   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)    a. 05/2016 ABI's: R 1.22, L 1.11.   Tobacco abuse      Vital Signs: BP 140/72    Pulse 93    Temp 99.3 F (37.4 C)    Resp 16    Ht 5' (1.524 m)    Wt 73 lb 3.2 oz (33.2 kg)    SpO2 98%    BMI 14.30 kg/m    Review of Systems  Constitutional:  Positive for appetite change, fatigue and unexpected weight change. Negative for chills, diaphoresis and fever.  HENT:  Positive for congestion, postnasal drip and sinus pressure. Negative for rhinorrhea, sneezing and sore throat.   Eyes:  Negative for redness.  Respiratory:  Positive for cough, chest tightness, shortness of breath and wheezing.   Cardiovascular:  Negative for chest pain and palpitations.  Gastrointestinal:  Negative for abdominal pain, constipation, diarrhea, nausea and vomiting.  Genitourinary:  Negative for dysuria and frequency.  Musculoskeletal:  Positive for arthralgias. Negative for back pain, joint swelling, myalgias and neck pain.  Skin:  Negative for rash.  Neurological:  Positive for weakness (generalized). Negative for tremors and numbness.  Hematological:  Negative for adenopathy. Does not bruise/bleed easily.  Psychiatric/Behavioral:  Positive for behavioral problems (Depression) and sleep disturbance. Negative for self-injury and suicidal ideas. The patient is nervous/anxious.    Physical Exam Constitutional:      General: She is not in acute distress.    Appearance: Normal appearance. She is ill-appearing.  HENT:     Head: Normocephalic and atraumatic.     Right Ear: Tympanic membrane, ear canal and external ear normal.     Left Ear: Tympanic membrane, ear canal and external ear normal.     Nose: Congestion and rhinorrhea present.     Mouth/Throat:     Mouth: Mucous membranes are moist.     Pharynx: Oropharynx is clear. No oropharyngeal exudate or posterior oropharyngeal erythema.  Eyes:     Pupils: Pupils are equal, round, and  reactive to light.  Cardiovascular:     Rate and Rhythm: Normal rate and regular rhythm.     Heart sounds: Murmur heard.  Pulmonary:     Effort: Accessory muscle usage present. No respiratory distress.     Breath sounds: Decreased air movement present. Examination of the right-upper field reveals wheezing. Examination of the left-upper field reveals wheezing. Examination of the right-middle field reveals wheezing. Examination of the left-middle field reveals wheezing. Examination of the right-lower field reveals decreased breath sounds and wheezing. Examination of the left-lower field reveals decreased breath sounds and wheezing. Decreased breath sounds and wheezing present.  Lymphadenopathy:  Cervical: No cervical adenopathy.  Neurological:     Mental Status: She is alert and oriented to person, place, and time.     Cranial Nerves: No cranial nerve deficit.     Coordination: Coordination normal.     Gait: Gait normal.  Psychiatric:        Mood and Affect: Mood normal.        Behavior: Behavior normal.      Assessment/Plan: 1. Acute bronchitis with COPD ([place]) Bronchitis with COPD, possible pneumonia, empiric antibiotic treatment prescribed.  - levofloxacin (LEVAQUIN) 750 MG tablet; Take 1 tablet (750 mg total) by mouth daily for 7 days.  Dispense: 7 tablet; Refill: 0 - predniSONE (DELTASONE) 10 MG tablet; Take one tab 3 x day for 3 days, then take one tab 2 x a day for 3 days and then take one tab a day for 3 days for copd  Dispense: 18 tablet; Refill: 0  2. COPD with exacerbation ([place]) Chest xray to rule out pneumonia and pulmonary edema. Levofloxacin prescribed - DG Chest 2 View; Future - levofloxacin (LEVAQUIN) 750 MG tablet; Take 1 tablet (750 mg total) by mouth daily for 7 days.  Dispense: 7 tablet; Refill: 0  3. Bilateral wheezing Chest xray ordered, prednisone ordered for wheezing and inflammation - DG Chest 2 View; Future - predniSONE (DELTASONE) 10 MG tablet; Take one  tab 3 x day for 3 days, then take one tab 2 x a day for 3 days and then take one tab a day for 3 days for copd  Dispense: 18 tablet; Refill: 0  4. Centrilobular emphysema ([place]) Once she is feeling better, Darnita needs to get her pneumonia vaccine.  - pneumococcal 20-valent conjugate vaccine (PREVNAR 20) 0.5 ML injection; Inject 0.5 mLs into the muscle tomorrow at 10 am for 1 dose.  Dispense: 0.5 mL; Refill: 0   General Counseling: Fredericka verbalizes understanding of the findings of todays visit and agrees with plan of treatment. I have discussed any further diagnostic evaluation that may be needed or ordered today. We also reviewed her medications today. she has been encouraged to call the office with any questions or concerns that should arise related to todays visit.    Counseling:    Orders Placed This Encounter  Procedures   DG Chest 2 View    Meds ordered this encounter  Medications   levofloxacin (LEVAQUIN) 750 MG tablet    Sig: Take 1 tablet (750 mg total) by mouth daily for 7 days.    Dispense:  7 tablet    Refill:  0   predniSONE (DELTASONE) 10 MG tablet    Sig: Take one tab 3 x day for 3 days, then take one tab 2 x a day for 3 days and then take one tab a day for 3 days for copd    Dispense:  18 tablet    Refill:  0   pneumococcal 20-valent conjugate vaccine (PREVNAR 20) 0.5 ML injection    Sig: Inject 0.5 mLs into the muscle tomorrow at 10 am for 1 dose.    Dispense:  0.5 mL    Refill:  0    Return in 1 month (on 09/13/2021) for F/U, Taleia Sadowski PCP, previously scheduled.  [place] Controlled Substance Database was reviewed by me for overdose risk score (ORS)  Time spent:30 Minutes Time spent with patient included reviewing progress notes, labs, imaging studies, and discussing plan for follow up.   This patient was seen by Jonetta Osgood, FNP-C in collaboration  with Dr. Clayborn Bigness as a part of collaborative care agreement.  Lynsey Ange R. Valetta Fuller, MSN, FNP-C Internal  Medicine

## 2021-08-16 MED ORDER — FLUCONAZOLE 150 MG PO TABS
150.0000 mg | ORAL_TABLET | Freq: Once | ORAL | 0 refills | Status: AC
Start: 2021-08-16 — End: 2021-08-16

## 2021-08-16 NOTE — Addendum Note (Signed)
Addended by: Jonetta Osgood on: 08/16/2021 09:10 AM   Modules accepted: Orders

## 2021-08-17 ENCOUNTER — Telehealth: Payer: Self-pay

## 2021-08-17 NOTE — Telephone Encounter (Signed)
Pt's daughter Chenae Brager) called and advising that pt was having a hard time regulating her breathing, I had patient breath in thru her nose and out thru mouth slow deep breaths, daughter said it didn't help, per [name] she advised that pt should go to ER especially after she is being treated with Levaquin and prednisone and chest xray was ok

## 2021-08-18 ENCOUNTER — Emergency Department: Payer: Medicare Other

## 2021-08-18 ENCOUNTER — Emergency Department
Admission: EM | Admit: 2021-08-18 | Discharge: 2021-08-18 | Disposition: A | Discharge status: Home / Self Care | Payer: Medicare Other | Attending: Emergency Medicine | Admitting: Emergency Medicine

## 2021-08-18 ENCOUNTER — Other Ambulatory Visit: Payer: Self-pay

## 2021-08-18 DIAGNOSIS — R0789 Other chest pain: Secondary | ICD-10-CM | POA: Diagnosis not present

## 2021-08-18 DIAGNOSIS — I10 Essential (primary) hypertension: Secondary | ICD-10-CM | POA: Diagnosis not present

## 2021-08-18 DIAGNOSIS — Z20822 Contact with and (suspected) exposure to covid-19: Secondary | ICD-10-CM | POA: Diagnosis not present

## 2021-08-18 DIAGNOSIS — I2510 Atherosclerotic heart disease of native coronary artery without angina pectoris: Secondary | ICD-10-CM | POA: Diagnosis not present

## 2021-08-18 DIAGNOSIS — R079 Chest pain, unspecified: Secondary | ICD-10-CM | POA: Insufficient documentation

## 2021-08-18 DIAGNOSIS — J439 Emphysema, unspecified: Secondary | ICD-10-CM | POA: Diagnosis not present

## 2021-08-18 DIAGNOSIS — R0689 Other abnormalities of breathing: Secondary | ICD-10-CM | POA: Diagnosis not present

## 2021-08-18 DIAGNOSIS — J80 Acute respiratory distress syndrome: Secondary | ICD-10-CM | POA: Diagnosis not present

## 2021-08-18 DIAGNOSIS — R0602 Shortness of breath: Secondary | ICD-10-CM | POA: Diagnosis not present

## 2021-08-18 DIAGNOSIS — J449 Chronic obstructive pulmonary disease, unspecified: Secondary | ICD-10-CM | POA: Insufficient documentation

## 2021-08-18 DIAGNOSIS — R6883 Chills (without fever): Secondary | ICD-10-CM | POA: Insufficient documentation

## 2021-08-18 DIAGNOSIS — R0600 Dyspnea, unspecified: Secondary | ICD-10-CM | POA: Diagnosis not present

## 2021-08-18 LAB — BASIC METABOLIC PANEL
Anion gap: 8 (ref 5–15)
BUN: 13 mg/dL (ref 8–23)
CO2: 32 mmol/L (ref 22–32)
Calcium: 9.2 mg/dL (ref 8.9–10.3)
Chloride: 93 mmol/L — ABNORMAL LOW (ref 98–111)
Creatinine, Ser: 0.77 mg/dL (ref 0.44–1.00)
GFR, Estimated: >60 mL/min (ref >60)
Glucose, Bld: 89 mg/dL (ref 70–99)
Potassium: 3.3 mmol/L — ABNORMAL LOW (ref 3.5–5.1)
Sodium: 133 mmol/L — ABNORMAL LOW (ref 135–145)

## 2021-08-18 LAB — RESP PANEL BY RT-PCR (FLU A&B, COVID) ARPGX2
Influenza A by PCR: NEGATIVE
Influenza B by PCR: NEGATIVE
SARS Coronavirus 2 by RT PCR: NEGATIVE

## 2021-08-18 LAB — CBC
HCT: 37.2 % (ref 36.0–46.0)
Hemoglobin: 12.5 g/dL (ref 12.0–15.0)
MCH: 30.6 pg (ref 26.0–34.0)
MCHC: 33.6 g/dL (ref 30.0–36.0)
MCV: 91 fL (ref 80.0–100.0)
Platelets: 247 10*3/uL (ref 150–400)
RBC: 4.09 MIL/uL (ref 3.87–5.11)
RDW: 13.2 % (ref 11.5–15.5)
WBC: 8.5 10*3/uL (ref 4.0–10.5)
nRBC: 0 % (ref 0.0–0.2)

## 2021-08-18 LAB — TROPONIN I (HIGH SENSITIVITY)
Troponin I (High Sensitivity): 11 ng/L (ref <18)
Troponin I (High Sensitivity): 11 ng/L (ref <18)

## 2021-08-18 MED ORDER — POTASSIUM CHLORIDE CRYS ER 20 MEQ PO TBCR
20.0000 meq | EXTENDED_RELEASE_TABLET | Freq: Once | ORAL | Status: AC
  Administered 2021-08-18: 20 meq via ORAL
  Filled 2021-08-18: qty 1

## 2021-08-18 MED ORDER — IPRATROPIUM-ALBUTEROL 0.5-2.5 (3) MG/3ML IN SOLN
3.0000 mL | Freq: Once | RESPIRATORY_TRACT | Status: AC
  Administered 2021-08-18: 3 mL via RESPIRATORY_TRACT
  Filled 2021-08-18: qty 3

## 2021-08-18 MED ORDER — IOHEXOL 350 MG/ML SOLN
75.0000 mL | Freq: Once | INTRAVENOUS | Status: AC | PRN
  Administered 2021-08-18: 60 mL via INTRAVENOUS

## 2021-08-18 NOTE — ED Provider Notes (Signed)
[facility] Provider Note    Event Date/Time   First MD Initiated Contact with Patient 08/18/21 1422     (approximate)   History   Chief Complaint Shortness of Breath and Chest Pain   HPI Christina Hester is a 81 y.o. female, history of COPD, AAA, PVD, CAD, GERD, and hypertension, presents to the emergency department for evaluation of chest pain and shortness of breath.  Patient states that she has been feeling increasingly short of breath since last week.  This morning, she stated that she felt a little bit of right-sided chest pain, that was resolved with nitroglycerin.  She endorses some chills as well.  Denies chest pain, abdominal pain, back pain, urinary symptoms, headache, or nausea/vomiting  Per external records review, patient was recently seen by her primary care provider on 08/15/2021 for same symptoms.  Initial chest x-ray showed no signs of focal consolidations, however patient was treated empirically with levofloxacin and prednisone.  Patient states that she is still currently taking her levofloxacin.  History Limitations: No limitations.      Physical Exam  Triage Vital Signs: ED Triage Vitals  Enc Vitals Group     BP 08/18/21 1212 (!) 153/72     Pulse Rate 08/18/21 1212 94     Resp 08/18/21 1212 (!) 24     Temp 08/18/21 1212 (!) 97.4 F (36.3 C)     Temp Source 08/18/21 1212 Oral     SpO2 08/18/21 1212 97 %     Weight 08/18/21 1213 73 lb (33.1 kg)     Height 08/18/21 1213 5' (1.524 m)     Head Circumference --      Peak Flow --      Pain Score 08/18/21 1213 4     Pain Loc --      Pain Edu? --      Excl. in [place]? --     Most recent vital signs: Vitals:   08/18/21 1500 08/18/21 1530  BP: (!) 143/67 140/75  Pulse: 96 100  Resp: 18 18  Temp:    SpO2: 94% 93%    General: Awake, NAD.  CV: Good peripheral perfusion.  S1 and S2 present.  No murmurs rubs or gallops. Resp: Normal effort.  Mild wheezing bilaterally. Abd: Soft,  non-tender. No distention.  Neuro: At baseline. No gross neurological deficits. Other: No chest wall tenderness.  Throat is nonerythematous.  No tonsillar swelling or exudates.  Uvula midline.  Physical Exam    ED Results / Procedures / Treatments  Labs (all labs ordered are listed, but only abnormal results are displayed) Labs Reviewed  BASIC METABOLIC PANEL - Abnormal; Notable for the following components:      Result Value   Sodium 133 (*)    Potassium 3.3 (*)    Chloride 93 (*)    All other components within normal limits  RESP PANEL BY RT-PCR (FLU A&B, COVID) ARPGX2  CBC  URINALYSIS, ROUTINE W REFLEX MICROSCOPIC  TROPONIN I (HIGH SENSITIVITY)  TROPONIN I (HIGH SENSITIVITY)     EKG Sinus rhythm, rate of 96, right axis deviation present, no ST segment changes, no AV blocks   RADIOLOGY  ED Provider Interpretation: I personally reviewed and interpreted this imaging.  No new infiltrates.  CT PE shows no clear evidence of pulmonary embolism on my interpretation  DG Chest 2 View  Result Date: 08/18/2021 CLINICAL DATA:  Chest pain EXAM: CHEST - 2 VIEW COMPARISON:  08/15/2021 FINDINGS: Cardiac size  is within normal limits. Low position of diaphragms suggests COPD. There are no signs of pulmonary edema or new focal infiltrates. There is no pleural effusion or pneumothorax. IMPRESSION: COPD.  There are no new infiltrates or signs of pulmonary edema. Electronically Signed   By: Elmer Picker M.D.   On: 08/18/2021 12:39   CT Angio Chest PE W and/or Wo Contrast  Result Date: 08/18/2021 CLINICAL DATA:  Shortness of breath, chest pain EXAM: CT ANGIOGRAPHY CHEST WITH CONTRAST TECHNIQUE: Multidetector CT imaging of the chest was performed using the standard protocol during bolus administration of intravenous contrast. Multiplanar CT image reconstructions and MIPs were obtained to evaluate the vascular anatomy. RADIATION DOSE REDUCTION: This exam was performed according to the  departmental dose-optimization program which includes automated exposure control, adjustment of the mA and/or kV according to patient size and/or use of iterative reconstruction technique. CONTRAST:  19mL OMNIPAQUE IOHEXOL 350 MG/ML SOLN COMPARISON:  09/29/2020 FINDINGS: Cardiovascular: Heart size normal. No pericardial effusion. The RV is nondilated. Satisfactory opacification of pulmonary arteries noted, and there is no evidence of pulmonary emboli. 3-vessel coronary calcifications. Good contrast opacification of the thoracic aorta without dissection, aneurysm, or stenosis. Scattered calcified atheromatous plaque in the arch and descending thoracic aorta. Classic 3 vessel brachiocephalic arterial origin anatomy with proximal calcified plaque but no high-grade stenosis. Visualized proximal abdominal aorta atheromatous, nondilated. Mediastinum/Nodes: No mass or adenopathy. Lungs/Pleura: No pleural effusion. No pneumothorax. Pulmonary emphysema. New peripheral airspace and ground-glass opacities in the anteromedial right middle lobe. Upper Abdomen: Asymmetric renal parenchymal enhancement right greater than left. Stable 3 cm left upper pole renal cyst. No acute findings. Musculoskeletal: No chest wall abnormality. No acute or significant osseous findings. Review of the MIP images confirms the above findings. IMPRESSION: 1. Negative for acute PE or thoracic aortic dissection. 2. New subsegmental infiltrate in the anteromedial right middle lobe, possibly pneumonia. 3. Coronary and Aortic Atherosclerosis (ICD10-170.0). 4.  Emphysema (ICD10-J43.9). Electronically Signed   By: Lucrezia Europe M.D.   On: 08/18/2021 16:10    PROCEDURES:  Critical Care performed: None.  Procedures    MEDICATIONS ORDERED IN ED: Medications  ipratropium-albuterol (DUONEB) 0.5-2.5 (3) MG/3ML nebulizer solution 3 mL (3 mLs Nebulization Given 08/18/21 1415)  potassium chloride SA (KLOR-CON M) CR tablet 20 mEq (20 mEq Oral Given 08/18/21 1522)   iohexol (OMNIPAQUE) 350 MG/ML injection 75 mL (60 mLs Intravenous Contrast Given 08/18/21 1544)     IMPRESSION / MDM / ASSESSMENT AND PLAN / ED COURSE  I reviewed the triage vital signs and the nursing notes.                              Christina Hester is a 81 y.o. female, history of COPD, AAA, PVD, CAD, GERD, and hypertension, presents to the emergency department for evaluation of chest pain and shortness of breath.  Patient states that she has been feeling increasingly short of breath since last week.  This morning, she stated that she felt a little bit of right-sided chest pain, that was resolved with nitroglycerin.  She endorses some chills as well.    Differential diagnosis includes, but is not limited to, PE, pneumothorax, ACS, influenza/COVID-19, COPD exacerbation, pneumonia.  ED Course Patient appears well.  Vital signs are within normal limits.  Appears comfortable at this time.  She says that she feels much better after her shot of Solu-Medrol.  CBC shows no leukocytosis or anemia.  BMP shows  mild hypokalemia at 3.3, will supplement with oral potassium.  Initial troponin 11.  Second troponin 11.  Respiratory panel negative for COVID-19 or influenza.  CT PE shows possible right lower lobe pneumonia.  No evidence of pulmonary embolism  Assessment/Plan Patient appears well.  Not suspect any serious or life-threatening pathology.  CT PE reassuring for no pulmonary embolism.  EKG and troponin are reassuring, unlikely ACS or myocarditis/pericarditis.  Chest x-ray shows possible pneumonia in the right middle lobe.  She is currently already on levofloxacin x7 days.  She is currently on day 3.  Advised her to continue taking her antibiotics.  She is also currently on prednisone 20 mg/day.  Encouraged her to continue taking that as well.  We will plan to discharge this patient with a referral for pulmonology for further evaluation  Patient was provided with anticipatory guidance, return  precautions, and educational material. Encouraged the patient to return to the emergency department at any time if they begin to experience any new or worsening symptoms.       FINAL CLINICAL IMPRESSION(S) / ED DIAGNOSES   Final diagnoses:  Dyspnea, unspecified type     Rx / DC Orders   ED Discharge Orders     None        Note:  This document was prepared using Dragon voice recognition software and may include unintentional dictation errors.   Teodoro Spray, [place] 08/18/21 1629    Delman Kitten, MD 08/22/21 [phone]

## 2021-08-18 NOTE — ED Triage Notes (Signed)
Pt arrives via ems from home, pt was given 125mg  solumedrol enroute, pt has been having sob, and chest pain, pt was seen by her pcp last week and had a cxr that didn't show any infection, pt has a hx of copd but isn't on O2, pt states that she feels like she is panting rather than breathing

## 2021-08-18 NOTE — Discharge Instructions (Signed)
-  Continue antibiotics and prednisone as prescribed. -Follow-up with the pulmonologist listed above or with your primary care provider. -Return to the emergency department if you begin to experience any new or worsening symptoms

## 2021-08-24 ENCOUNTER — Inpatient Hospital Stay: Payer: Medicare Other | Admitting: Nurse Practitioner

## 2021-08-25 ENCOUNTER — Other Ambulatory Visit: Payer: Self-pay | Admitting: Nurse Practitioner

## 2021-08-29 ENCOUNTER — Telehealth (INDEPENDENT_AMBULATORY_CARE_PROVIDER_SITE_OTHER): Payer: Medicare Other | Admitting: Physician Assistant

## 2021-08-29 ENCOUNTER — Encounter: Payer: Self-pay | Admitting: Physician Assistant

## 2021-08-29 VITALS — Ht 60.0 in | Wt 72.0 lb

## 2021-08-29 DIAGNOSIS — U071 COVID-19: Secondary | ICD-10-CM | POA: Diagnosis not present

## 2021-08-29 DIAGNOSIS — J449 Chronic obstructive pulmonary disease, unspecified: Secondary | ICD-10-CM | POA: Diagnosis not present

## 2021-08-29 MED ORDER — MOLNUPIRAVIR EUA 200MG CAPSULE: 4.0000 | ORAL_CAPSULE | Freq: Two times a day (BID) | ORAL | 0 refills | Status: AC

## 2021-08-29 MED ORDER — LEVOFLOXACIN 500 MG PO TABS
500.0000 mg | ORAL_TABLET | Freq: Every day | ORAL | 0 refills | Status: AC
Start: 2021-08-29 — End: 2021-09-05

## 2021-08-29 NOTE — Progress Notes (Signed)
[name] [place], [place] [address]  Internal MEDICINE  Telephone Visit  Patient Name: Christina Hester  [id number]  [id number]  Date of Service: 08/29/2021  I connected with the patient at 9:05 by telephone and verified the patients identity using two identifiers.   I discussed the limitations, risks, security and privacy concerns of performing an evaluation and management service by telephone and the availability of in person appointments. I also discussed with the patient that there may be a patient responsible charge related to the service.  The patient expressed understanding and agrees to proceed.    Chief Complaint  Patient presents with   Telephone Assessment    [id number]   Telephone Screen    Covid positive    Cough   Headache   Diarrhea    HPI Pt is here for virtual sick visit -Symptoms started sat afternoon, covid positive on Sunday. Her family members are also sick. -She has been having fever, productive cough, headaches, and some diarrhea -Her Oxygen is at 91%, No SOB or wheezing, taking her inhalers and using oxygen as prescribed. -Has been taking tylenol  -patient admits she took one dose of her daughter's paxlovid and was told to not continue this as this is dangerous for her given multiple medical problems including reduced GFR. Discussed sending alternative antiviral along with an antibiotic given she is high risk with COPD on oxygen.  Current Medication: Outpatient Encounter Medications as of 08/29/2021  Medication Sig   amLODipine (NORVASC) 2.5 MG tablet Take 1 tablet (2.5 mg total) by mouth daily. Take along with 5 mg daily for total of 7.5 mg.   amLODipine (NORVASC) 5 MG tablet Take 1 tablet (5 mg total) by mouth daily. Take along with 2.5 mg for a total of 7.5 mg.   Budeson-Glycopyrrol-Formoterol (BREZTRI AEROSPHERE) 160-9-4.8 MCG/ACT AERO Inhale 2 puffs into the lungs 2 (two) times daily.   buPROPion (WELLBUTRIN) 100 MG tablet  Take 1 tablet (100 mg total) by mouth daily.   clopidogrel (PLAVIX) 75 MG tablet Take 1 tablet (75 mg total) by mouth daily.   clotrimazole (MYCELEX) 10 MG troche Take 1 tablet (10 mg total) by mouth 5 (five) times daily.   ipratropium-albuterol (DUONEB) 0.5-2.5 (3) MG/3ML SOLN USE 1 VIAL IN NEBULIZER EVERY 4 HOURS   levofloxacin (LEVAQUIN) 500 MG tablet Take 1 tablet (500 mg total) by mouth daily for 7 days.   LORazepam (ATIVAN) 0.5 MG tablet TAKE 1/2 TO 1 TABLET BY MOUTH EVERY 8 HOURS AS NEEDED FOR ANXIETY OR SLEEP   meloxicam (MOBIC) 15 MG tablet Take 1 tablet (15 mg total) by mouth daily.   mirtazapine (REMERON) 7.5 MG tablet Take 1 tablet (7.5 mg total) by mouth at bedtime.   molnupiravir EUA (LAGEVRIO) 200 mg CAPS capsule Take 4 capsules (800 mg total) by mouth 2 (two) times daily for 5 days.   nitroGLYCERIN (NITROSTAT) 0.4 MG SL tablet Place 1 tablet (0.4 mg total) under the tongue every 5 (five) minutes as needed for chest pain.   nystatin (MYCOSTATIN) 100000 UNIT/ML suspension Take 5 mLs (500,000 Units total) by mouth 4 (four) times daily.   oseltamivir (TAMIFLU) 75 MG capsule Take 1 capsule (75 mg total) by mouth 2 (two) times daily.   predniSONE (DELTASONE) 10 MG tablet Take one tab 3 x day for 3 days, then take one tab 2 x a day for 3 days and then take one tab a day for 3 days for copd  VENTOLIN HFA 108 (90 Base) MCG/ACT inhaler INHALE 2 PUFFS INTO THE LUNGS EVERY 6 HOURS AS NEEDED FOR WHEEZING OR SHORTNESS OF BREATH.   No facility-administered encounter medications on file as of 08/29/2021.    Surgical History: Past Surgical History:  Procedure Laterality Date   ABDOMINAL HYSTERECTOMY     APPENDECTOMY     BREAST EXCISIONAL BIOPSY Left 80's or 90's   NEG   CARDIAC CATHETERIZATION     COLONOSCOPY N/A 05/22/2018   Procedure: COLONOSCOPY;  Surgeon: Lin Landsman, MD;  Location: [facility];  Service: Gastroenterology;  Laterality: N/A;   COLONOSCOPY WITH PROPOFOL N/A  07/19/2020   Procedure: COLONOSCOPY WITH PROPOFOL;  Surgeon: Tol[place], Benay Pike, MD;  Location: [facility];  Service: Gastroenterology;  Laterality: N/A;   ENDARTERECTOMY Right 11/06/2019   Procedure: ENDARTERECTOMY CAROTID;  Surgeon: Algernon Huxley, MD;  Location: [facility];  Service: Vascular;  Laterality: Right;   LEFT HEART CATH Bilateral 03/05/2017   Procedure: Left Heart Cath with poss PCI;  Surgeon: Wellington Hampshire, MD;  Location: [facility];  Service: Cardiovascular;  Laterality: Bilateral;    Medical History: Past Medical History:  Diagnosis Date   AAA (abdominal aortic aneurysm)    a. 05/2016 Abd U/S: 3.02 x 3.02 AAA.   Anxiety    CAD (coronary artery disease)    a. 02/2017 NSTEMI/Cath: LM nl, LAD mild dzs, D2 min irregs, LCX mild dzs, OM2 50ost, RCA 95p, 175m, L-->R collats, EF 55-65%.   COPD (chronic obstructive pulmonary disease) (HCC)    Diastolic dysfunction    a. 02/2017 Echo: EF 55-60%, gr1 DD, ? inf HK, mild MR.   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 05/2016 ABI's: R 1.22, L 1.11.   Tobacco abuse     Family History: Family History  Problem Relation Age of Onset   Breast cancer Mother    Dementia Mother    Hypertension Mother    Breast cancer Maternal Aunt    Breast cancer Maternal Aunt    Breast cancer Maternal Aunt    CVA Father     Social History   Socioeconomic History   Marital status: Divorced    Spouse name: Not on file   Number of children: Not on file   Years of education: Not on file   Highest education level: Not on file  Occupational History   Not on file  Tobacco Use   Smoking status: Former    Packs/day: 0.25    Types: Cigarettes    Quit date: 07/12/2017    Years since quitting: 4.1   Smokeless tobacco: Never   Tobacco comments:    currently 90 days without smoking  Vaping Use    Vaping Use: Never used  Substance and Sexual Activity   Alcohol use: No   Drug use: No   Sexual activity: Not on file  Other Topics Concern   Not on file  Social History Narrative   Not on file   Social Determinants of Health   Financial Resource Strain: Not on file  Food Insecurity: Not on file  Transportation Needs: Not on file  Physical Activity: Not on file  Stress: Not on file  Social Connections: Not on file  Intimate Partner Violence: Not on file  Review of Systems  Constitutional:  Positive for fever. Negative for fatigue.  HENT:  Positive for congestion, postnasal drip and sinus pressure. Negative for mouth sores.   Respiratory:  Positive for cough. Negative for shortness of breath and wheezing.   Cardiovascular:  Negative for chest pain.  Gastrointestinal:  Positive for diarrhea.  Genitourinary:  Negative for flank pain.  Neurological:  Positive for headaches.  Psychiatric/Behavioral: Negative.     Vital Signs: Ht 5' (1.524 m)    Wt 72 lb (32.7 kg)    BMI 14.06 kg/m    Observation/Objective:  Pt is able to carry out conversation   Assessment/Plan: 1. Acute COVID-19 Will start on molnupiravir and levaquin due to being high risk for complications. Advised to not take anyone else's medication. May take tylenol as needed. - molnupiravir EUA (LAGEVRIO) 200 mg CAPS capsule; Take 4 capsules (800 mg total) by mouth 2 (two) times daily for 5 days.  Dispense: 40 capsule; Refill: 0 - levofloxacin (LEVAQUIN) 500 MG tablet; Take 1 tablet (500 mg total) by mouth daily for 7 days.  Dispense: 7 tablet; Refill: 0  2. COPD with chronic bronchitis and emphysema ([place]) Continue inhalers and oxygen as prescribed and as indicated - molnupiravir EUA (LAGEVRIO) 200 mg CAPS capsule; Take 4 capsules (800 mg total) by mouth 2 (two) times daily for 5 days.  Dispense: 40 capsule; Refill: 0 - levofloxacin (LEVAQUIN) 500 MG tablet; Take 1 tablet (500 mg total) by mouth daily for  7 days.  Dispense: 7 tablet; Refill: 0   General Counseling: Sha verbalizes understanding of the findings of today's phone visit and agrees with plan of treatment. I have discussed any further diagnostic evaluation that may be needed or ordered today. We also reviewed her medications today. she has been encouraged to call the office with any questions or concerns that should arise related to todays visit.    No orders of the defined types were placed in this encounter.   Meds ordered this encounter  Medications   molnupiravir EUA (LAGEVRIO) 200 mg CAPS capsule    Sig: Take 4 capsules (800 mg total) by mouth 2 (two) times daily for 5 days.    Dispense:  40 capsule    Refill:  0   levofloxacin (LEVAQUIN) 500 MG tablet    Sig: Take 1 tablet (500 mg total) by mouth daily for 7 days.    Dispense:  7 tablet    Refill:  0    Time spent:25 Minutes    Dr Lavera Guise Internal medicine

## 2021-09-01 ENCOUNTER — Other Ambulatory Visit: Payer: Self-pay | Admitting: Internal Medicine

## 2021-09-01 DIAGNOSIS — F411 Generalized anxiety disorder: Secondary | ICD-10-CM

## 2021-09-02 NOTE — Telephone Encounter (Signed)
Med sent to [facility]

## 2021-09-07 DIAGNOSIS — J449 Chronic obstructive pulmonary disease, unspecified: Secondary | ICD-10-CM | POA: Diagnosis not present

## 2021-09-09 ENCOUNTER — Other Ambulatory Visit (INDEPENDENT_AMBULATORY_CARE_PROVIDER_SITE_OTHER): Payer: Self-pay | Admitting: Vascular Surgery

## 2021-09-09 ENCOUNTER — Ambulatory Visit (INDEPENDENT_AMBULATORY_CARE_PROVIDER_SITE_OTHER): Payer: Medicare Other

## 2021-09-09 ENCOUNTER — Other Ambulatory Visit (INDEPENDENT_AMBULATORY_CARE_PROVIDER_SITE_OTHER): Payer: Self-pay | Admitting: Nurse Practitioner

## 2021-09-09 ENCOUNTER — Ambulatory Visit (INDEPENDENT_AMBULATORY_CARE_PROVIDER_SITE_OTHER): Payer: Medicare Other | Admitting: Vascular Surgery

## 2021-09-09 ENCOUNTER — Other Ambulatory Visit: Payer: Self-pay

## 2021-09-09 VITALS — BP 139/73 | HR 87 | Ht 60.0 in | Wt 74.0 lb

## 2021-09-09 DIAGNOSIS — I6523 Occlusion and stenosis of bilateral carotid arteries: Secondary | ICD-10-CM

## 2021-09-09 DIAGNOSIS — I714 Abdominal aortic aneurysm, without rupture, unspecified: Secondary | ICD-10-CM | POA: Diagnosis not present

## 2021-09-09 DIAGNOSIS — I7143 Infrarenal abdominal aortic aneurysm, without rupture: Secondary | ICD-10-CM | POA: Diagnosis not present

## 2021-09-09 DIAGNOSIS — I10 Essential (primary) hypertension: Secondary | ICD-10-CM

## 2021-09-09 DIAGNOSIS — I2510 Atherosclerotic heart disease of native coronary artery without angina pectoris: Secondary | ICD-10-CM | POA: Diagnosis not present

## 2021-09-09 DIAGNOSIS — I2583 Coronary atherosclerosis due to lipid rich plaque: Secondary | ICD-10-CM | POA: Diagnosis not present

## 2021-09-09 DIAGNOSIS — I739 Peripheral vascular disease, unspecified: Secondary | ICD-10-CM

## 2021-09-09 DIAGNOSIS — I200 Unstable angina: Secondary | ICD-10-CM

## 2021-09-09 NOTE — Progress Notes (Signed)
MRN : [id number]  Christina Hester is a 81 y.o. ([birth year]) female who presents with chief complaint of  Chief Complaint  Patient presents with   Follow-up    53yr AAA, ABI Carotid   .  History of Present Illness: Patient returns today in follow up of multiple vascular issues.  She is doing well today.  Her biggest complaint today is of her worsening COPD.  She denies any aneurysm related symptoms. Specifically, the patient denies new back or abdominal pain, or signs of peripheral embolization.  Her aortic duplex today shows slight increase in size now measuring 3.5 cm of her infrarenal abdominal aortic aneurysm. She denies any focal neurologic symptoms from her carotid disease. Specifically, the patient denies amaurosis fugax, speech or swallowing difficulties, or arm or leg weakness or numbness. Carotid duplex today reveals 1 to 39% right ICA stenosis and 40 to 59% left ICA stenosis without significant progression from previous study. She also had her ABIs checked today.  She really denies any significant lifestyle limiting claudication, ischemic rest pain, or ulceration.  Her ABIs today are slightly decreased at 0.93 on the right and 0.77 on the left but she still has good normal digital pressures distally.  Current Outpatient Medications  Medication Sig Dispense Refill   amLODipine (NORVASC) 2.5 MG tablet Take 1 tablet (2.5 mg total) by mouth daily. Take along with 5 mg daily for total of 7.5 mg. 90 tablet 3   amLODipine (NORVASC) 5 MG tablet Take 1 tablet (5 mg total) by mouth daily. Take along with 2.5 mg for a total of 7.5 mg. 90 tablet 3   azithromycin (ZITHROMAX) 250 MG tablet Take by mouth.     Budeson-Glycopyrrol-Formoterol (BREZTRI AEROSPHERE) 160-9-4.8 MCG/ACT AERO Inhale 2 puffs into the lungs 2 (two) times daily. 10.7 g 11   buPROPion (WELLBUTRIN) 100 MG tablet Take 1 tablet (100 mg total) by mouth daily. 90 tablet 1   clopidogrel (PLAVIX) 75 MG tablet Take 1 tablet (75 mg  total) by mouth daily. 90 tablet 3   clotrimazole (MYCELEX) 10 MG troche Take 1 tablet (10 mg total) by mouth 5 (five) times daily. 70 Troche 2   ipratropium-albuterol (DUONEB) 0.5-2.5 (3) MG/3ML SOLN USE 1 VIAL IN NEBULIZER EVERY 4 HOURS 360 mL 11   LORazepam (ATIVAN) 0.5 MG tablet TAKE 1/2 TO 1 TABLET BY MOUTH EVERY 8 HOURS AS NEEDED FOR ANXIETY OR SLEEP 60 tablet 0   LORazepam (ATIVAN) 0.5 MG tablet Take by mouth.     meloxicam (MOBIC) 15 MG tablet Take 1 tablet (15 mg total) by mouth daily. 30 tablet 0   mirtazapine (REMERON) 7.5 MG tablet Take 1 tablet (7.5 mg total) by mouth at bedtime. 30 tablet 2   nitroGLYCERIN (NITROSTAT) 0.4 MG SL tablet Place 1 tablet (0.4 mg total) under the tongue every 5 (five) minutes as needed for chest pain. 20 tablet 12   nystatin (MYCOSTATIN) 100000 UNIT/ML suspension Take 5 mLs (500,000 Units total) by mouth 4 (four) times daily. 200 mL 1   oseltamivir (TAMIFLU) 75 MG capsule Take 1 capsule (75 mg total) by mouth 2 (two) times daily. 10 capsule 0   predniSONE (DELTASONE) 10 MG tablet Take one tab 3 x day for 3 days, then take one tab 2 x a day for 3 days and then take one tab a day for 3 days for copd 18 tablet 0   spironolactone (ALDACTONE) 25 MG tablet Take 1 tablet by mouth daily.  VENTOLIN HFA 108 (90 Base) MCG/ACT inhaler INHALE 2 PUFFS INTO THE LUNGS EVERY 6 HOURS AS NEEDED FOR WHEEZING OR SHORTNESS OF BREATH. 18 g 3   No current facility-administered medications for this visit.    Past Medical History:  Diagnosis Date   AAA (abdominal aortic aneurysm)    a. 05/2016 Abd U/S: 3.02 x 3.02 AAA.   Anxiety    CAD (coronary artery disease)    a. 02/2017 NSTEMI/Cath: LM nl, LAD mild dzs, D2 min irregs, LCX mild dzs, OM2 50ost, RCA 95p, 136m, L-->R collats, EF 55-65%.   COPD (chronic obstructive pulmonary disease) (HCC)    Diastolic dysfunction    a. 02/2017 Echo: EF 55-60%, gr1 DD, ? inf HK, mild MR.   Hx of basal cell carcinoma    back    Hx of  squamous cell carcinoma 12/02/2018   Right lateral breast    Hyperlipidemia    Hypertension    PAD (peripheral artery disease) ([place])    a. 05/2016 ABI's: R 1.22, L 1.11.   Tobacco abuse     Past Surgical History:  Procedure Laterality Date   ABDOMINAL HYSTERECTOMY     APPENDECTOMY     BREAST EXCISIONAL BIOPSY Left 80's or 90's   NEG   CARDIAC CATHETERIZATION     COLONOSCOPY N/A 05/22/2018   Procedure: COLONOSCOPY;  Surgeon: Lin Landsman, MD;  Location: [facility];  Service: Gastroenterology;  Laterality: N/A;   COLONOSCOPY WITH PROPOFOL N/A 07/19/2020   Procedure: COLONOSCOPY WITH PROPOFOL;  Surgeon: Toledo, Benay Pike, MD;  Location: [facility];  Service: Gastroenterology;  Laterality: N/A;   ENDARTERECTOMY Right 11/06/2019   Procedure: ENDARTERECTOMY CAROTID;  Surgeon: Algernon Huxley, MD;  Location: [facility];  Service: Vascular;  Laterality: Right;   LEFT HEART CATH Bilateral 03/05/2017   Procedure: Left Heart Cath with poss PCI;  Surgeon: Wellington Hampshire, MD;  Location: [facility];  Service: Cardiovascular;  Laterality: Bilateral;     Social History   Tobacco Use   Smoking status: Former    Packs/day: 0.25    Types: Cigarettes    Quit date: 07/12/2017    Years since quitting: 4.1   Smokeless tobacco: Never   Tobacco comments:    currently 90 days without smoking  Vaping Use   Vaping Use: Never used  Substance Use Topics   Alcohol use: No   Drug use: No      Family History  Problem Relation Age of Onset   Breast cancer Mother    Dementia Mother    Hypertension Mother    Breast cancer Maternal Aunt    Breast cancer Maternal Aunt    Breast cancer Maternal Aunt    CVA Father      Allergies  Allergen Reactions   Aspirin Tinitus   Budesonide-Formoterol Fumarate Other (See Comments)    Patient felt "out of her mind" and angry. Patient felt "out of her mind" and angry.   Prednisone Other (See Comments)    Makes violent   Sulfa  Antibiotics Nausea And Vomiting   Tramadol Other (See Comments)     REVIEW OF SYSTEMS (Negative unless checked)   Constitutional: [] Weight loss  [] Fever  [] Chills Cardiac: [] Chest pain   [] Chest pressure   [] Palpitations   [] Shortness of breath when laying flat   [] Shortness of breath at rest   [] Shortness of breath with exertion. Vascular:  [] Pain in legs with walking   [] Pain in legs at rest   [] Pain  in legs when laying flat   [] Claudication   [] Pain in feet when walking  [] Pain in feet at rest  [] Pain in feet when laying flat   [] History of DVT   [] Phlebitis   [] Swelling in legs   [] Varicose veins   [] Non-healing ulcers Pulmonary:   [] Uses home oxygen   [] Productive cough   [] Hemoptysis   [] Wheeze  [x] COPD   [] Asthma Neurologic:  [] Dizziness  [] Blackouts   [] Seizures   [] History of stroke   [] History of TIA  [] Aphasia   [] Temporary blindness   [] Dysphagia   [] Weakness or numbness in arms   [] Weakness or numbness in legs Musculoskeletal:  [x] Arthritis   [] Joint swelling   [x] Joint pain   [] Low back pain Hematologic:  [] Easy bruising  [] Easy bleeding   [] Hypercoagulable state   [] Anemic   Gastrointestinal:  [] Blood in stool   [] Vomiting blood  [] Gastroesophageal reflux/heartburn   [] Abdominal pain Genitourinary:  [] Chronic kidney disease   [] Difficult urination  [] Frequent urination  [] Burning with urination   [] Hematuria Skin:  [] Rashes   [] Ulcers   [] Wounds Psychological:  [x] History of anxiety   []  History of major depression.  Physical Examination  BP 139/73    Pulse 87    Ht 5' (1.524 m)    Wt 74 lb (33.6 kg)    BMI 14.45 kg/m  Gen: Elderly, thin, NAD Head: [place]/AT, + temporalis wasting. Ear/Nose/Throat: Hearing grossly intact, nares w/o erythema or drainage Eyes: Conjunctiva clear. Sclera non-icteric Neck: Supple.  Trachea midline.  No bruit Pulmonary:  Good air movement, no use of accessory muscles.  Cardiac: RRR, no JVD Vascular:  Vessel Right Left  Radial Palpable Palpable                           PT Palpable Palpable  DP Palpable Palpable   Gastrointestinal: soft, non-tender/non-distended. No guarding/reflex.  Musculoskeletal: M/S 5/5 throughout.  No deformity or atrophy.  No edema. Neurologic: Sensation grossly intact in extremities.  Symmetrical.  Speech is fluent.  Psychiatric: Judgment intact, Mood & affect appropriate for pt's clinical situation. Dermatologic: No rashes or ulcers noted.  No cellulitis or open wounds.      Labs Recent Results (from the past 2160 hour(s))  Basic metabolic panel     Status: Abnormal   Collection Time: 08/18/21 12:20 PM  Result Value Ref Range   Sodium 133 (L) 135 - 145 mmol/L   Potassium 3.3 (L) 3.5 - 5.1 mmol/L   Chloride 93 (L) 98 - 111 mmol/L   CO2 32 22 - 32 mmol/L   Glucose, Bld 89 70 - 99 mg/dL    Comment: Glucose reference range applies only to samples taken after fasting for at least 8 hours.   BUN 13 8 - 23 mg/dL   Creatinine, Ser 0.77 0.44 - 1.00 mg/dL   Calcium 9.2 8.9 - 10.3 mg/dL   GFR, Estimated >60 >60 mL/min    Comment: (NOTE) Calculated using the CKD-EPI Creatinine Equation (2021)    Anion gap 8 5 - 15    Comment: Performed at [facility], [address] [place], [place] [address]  CBC     Status: None   Collection Time: 08/18/21 12:20 PM  Result Value Ref Range   WBC 8.5 4.0 - 10.5 K/uL   RBC 4.09 3.87 - 5.11 MIL/uL   Hemoglobin 12.5 12.0 - 15.0 g/dL   HCT 37.2 36.0 - 46.0 %   MCV 91.0 80.0 -  100.0 fL   MCH 30.6 26.0 - 34.0 pg   MCHC 33.6 30.0 - 36.0 g/dL   RDW 13.2 11.5 - 15.5 %   Platelets 247 150 - 400 K/uL   nRBC 0.0 0.0 - 0.2 %    Comment: Performed at [facility], [place], [place] [address]  Troponin I (High Sensitivity)     Status: None   Collection Time: 08/18/21 12:20 PM  Result Value Ref Range   Troponin I (High Sensitivity) 11 <18 ng/L    Comment: (NOTE) Elevated high sensitivity troponin I (hsTnI) values and significant   changes across serial measurements may suggest ACS but many other  chronic and acute conditions are known to elevate hsTnI results.  Refer to the "Links" section for chest pain algorithms and additional  guidance. Performed at [facility], [place], [place] [address]   Troponin I (High Sensitivity)     Status: None   Collection Time: 08/18/21  3:01 PM  Result Value Ref Range   Troponin I (High Sensitivity) 11 <18 ng/L    Comment: (NOTE) Elevated high sensitivity troponin I (hsTnI) values and significant  changes across serial measurements may suggest ACS but many other  chronic and acute conditions are known to elevate hsTnI results.  Refer to the "Links" section for chest pain algorithms and additional  guidance. Performed at [facility], [address] [place], [place] [address]   Resp Panel by RT-PCR (Flu A&B, Covid) Nasopharyngeal Swab     Status: None   Collection Time: 08/18/21  3:05 PM   Specimen: Nasopharyngeal Swab; Nasopharyngeal(NP) swabs in vial transport medium  Result Value Ref Range   SARS Coronavirus 2 by RT PCR NEGATIVE NEGATIVE    Comment: (NOTE) SARS-CoV-2 target nucleic acids are NOT DETECTED.  The SARS-CoV-2 RNA is generally detectable in upper respiratory specimens during the acute phase of infection. The lowest concentration of SARS-CoV-2 viral copies this assay can detect is 138 copies/mL. A negative result does not preclude SARS-Cov-2 infection and should not be used as the sole basis for treatment or other patient management decisions. A negative result may occur with  improper specimen collection/handling, submission of specimen other than nasopharyngeal swab, presence of viral mutation(s) within the areas targeted by this assay, and inadequate number of viral copies(<138 copies/mL). A negative result must be combined with clinical observations, patient history, and epidemiological information. The expected  result is Negative.  Fact Sheet for Patients:  EntrepreneurPulse.com.au  Fact Sheet for Healthcare Providers:  IncredibleEmployment.be  This test is no t yet approved or cleared by the [place] FDA and  has been authorized for detection and/or diagnosis of SARS-CoV-2 by FDA under an Emergency Use Authorization (EUA). This EUA will remain  in effect (meaning this test can be used) for the duration of the COVID-19 declaration under Section 564(b)(1) of the Act, 21 U.S.C.section 360bbb-3(b)(1), unless the authorization is terminated  or revoked sooner.       Influenza A by PCR NEGATIVE NEGATIVE   Influenza B by PCR NEGATIVE NEGATIVE    Comment: (NOTE) The Xpert Xpress SARS-CoV-2/FLU/RSV plus assay is intended as an aid in the diagnosis of influenza from Nasopharyngeal swab specimens and should not be used as a sole basis for treatment. Nasal washings and aspirates are unacceptable for Xpert Xpress SARS-CoV-2/FLU/RSV testing.  Fact Sheet for Patients: EntrepreneurPulse.com.au  Fact Sheet for Healthcare Providers: IncredibleEmployment.be  This test is not yet approved or cleared by the  [place] Architectural technologist and has been authorized for detection and/or diagnosis of SARS-CoV-2 by FDA under an Print production planner (EUA). This EUA will remain in effect (meaning this test can be used) for the duration of the COVID-19 declaration under Section 564(b)(1) of the Act, 21 U.S.C. section 360bbb-3(b)(1), unless the authorization is terminated or revoked.  Performed at [facility], [address], [place],  [address]     Radiology DG Chest 2 View  Result Date: 08/18/2021 CLINICAL DATA:  Chest pain EXAM: CHEST - 2 VIEW COMPARISON:  08/15/2021 FINDINGS: Cardiac size is within normal limits. Low position of diaphragms suggests COPD. There are no signs of pulmonary edema or new focal infiltrates.  There is no pleural effusion or pneumothorax. IMPRESSION: COPD.  There are no new infiltrates or signs of pulmonary edema. Electronically Signed   By: Elmer Picker M.D.   On: 08/18/2021 12:39   DG Chest 2 View  Result Date: 08/15/2021 CLINICAL DATA:  81 year old female with wheezing and shortness of breath EXAM: CHEST - 2 VIEW COMPARISON:  05/03/2021 FINDINGS: Cardiomediastinal silhouette unchanged in size and contour. No evidence of central vascular congestion. No interlobular septal thickening. Stigmata of emphysema, with increased retrosternal airspace, flattened hemidiaphragms, increased AP diameter, and hyperinflation on the AP view. Architectural distortion of the lungs. Coarsened interstitial markings with no confluent airspace disease. The nodular appearance at the left lower lung base has resolved in the interval. Pleuroparenchymal thickening at the apices. No pneumothorax or pleural effusion. No acute displaced fracture. Degenerative changes of the spine. Mild scoliotic curvature unchanged. IMPRESSION: Emphysema without evidence of acute cardiopulmonary disease. The nodular density at the left lung base on the prior has resolved in the interval Electronically Signed   By: Corrie Mckusick D.O.   On: 08/15/2021 15:56   CT Angio Chest PE W and/or Wo Contrast  Result Date: 08/18/2021 CLINICAL DATA:  Shortness of breath, chest pain EXAM: CT ANGIOGRAPHY CHEST WITH CONTRAST TECHNIQUE: Multidetector CT imaging of the chest was performed using the standard protocol during bolus administration of intravenous contrast. Multiplanar CT image reconstructions and MIPs were obtained to evaluate the vascular anatomy. RADIATION DOSE REDUCTION: This exam was performed according to the departmental dose-optimization program which includes automated exposure control, adjustment of the mA and/or kV according to patient size and/or use of iterative reconstruction technique. CONTRAST:  9mL OMNIPAQUE IOHEXOL 350 MG/ML  SOLN COMPARISON:  09/29/2020 FINDINGS: Cardiovascular: Heart size normal. No pericardial effusion. The RV is nondilated. Satisfactory opacification of pulmonary arteries noted, and there is no evidence of pulmonary emboli. 3-vessel coronary calcifications. Good contrast opacification of the thoracic aorta without dissection, aneurysm, or stenosis. Scattered calcified atheromatous plaque in the arch and descending thoracic aorta. Classic 3 vessel brachiocephalic arterial origin anatomy with proximal calcified plaque but no high-grade stenosis. Visualized proximal abdominal aorta atheromatous, nondilated. Mediastinum/Nodes: No mass or adenopathy. Lungs/Pleura: No pleural effusion. No pneumothorax. Pulmonary emphysema. New peripheral airspace and ground-glass opacities in the anteromedial right middle lobe. Upper Abdomen: Asymmetric renal parenchymal enhancement right greater than left. Stable 3 cm left upper pole renal cyst. No acute findings. Musculoskeletal: No chest wall abnormality. No acute or significant osseous findings. Review of the MIP images confirms the above findings. IMPRESSION: 1. Negative for acute PE or thoracic aortic dissection. 2. New subsegmental infiltrate in the anteromedial right middle lobe, possibly pneumonia. 3. Coronary and Aortic Atherosclerosis (ICD10-170.0). 4.  Emphysema (ICD10-J43.9). Electronically Signed   By: Lucrezia Europe M.D.   On: 08/18/2021 16:10  Assessment/Plan Benign hypertension blood pressure control important in reducing the progression of atherosclerotic disease. On appropriate oral medications.  Coronary artery disease Has been seen by cardiology.  No issues with surgery earlier this year.  AAA (abdominal aortic aneurysm) without rupture (HCC) Her aortic duplex today shows slight increase in size now measuring 3.5 cm of her infrarenal abdominal aortic aneurysm. No role for intervention. Recheck in one year.  Carotid stenosis Carotid duplex today reveals 1 to  39% right ICA stenosis and 40 to 59% left ICA stenosis without significant progression from previous study.  No role for intervention at that level.  Continue to follow on an annual basis.  Continue current medical regimen.  PVD (peripheral vascular disease) ([place]) Her ABIs today are slightly decreased at 0.93 on the right and 0.77 on the left but she still has good normal digital pressures distally.  No worrisome symptoms.  Recheck in 1 year.    Leotis Pain, MD  09/09/2021 12:41 PM    This note was created with Dragon medical transcription system.  Any errors from dictation are purely unintentional

## 2021-09-09 NOTE — Assessment & Plan Note (Signed)
Carotid duplex today reveals 1 to 39% right ICA stenosis and 40 to 59% left ICA stenosis without significant progression from previous study.  No role for intervention at that level.  Continue to follow on an annual basis.  Continue current medical regimen.

## 2021-09-09 NOTE — Assessment & Plan Note (Addendum)
Her ABIs today are slightly decreased at 0.93 on the right and 0.77 on the left but she still has good normal digital pressures distally.  No worrisome symptoms.  Recheck in 1 year.

## 2021-09-09 NOTE — Assessment & Plan Note (Signed)
Her aortic duplex today shows slight increase in size now measuring 3.5 cm of her infrarenal abdominal aortic aneurysm. No role for intervention. Recheck in one year.

## 2021-09-13 ENCOUNTER — Ambulatory Visit: Payer: Medicare Other | Admitting: Nurse Practitioner

## 2021-09-16 ENCOUNTER — Telehealth: Payer: Self-pay

## 2021-09-16 NOTE — Telephone Encounter (Signed)
Called to schedule ED follow up. Patient stated she has already followed up with her pulmonary doctor and will see [place] 10/05/21 as already scheduled-Toni ?

## 2021-09-21 DIAGNOSIS — J449 Chronic obstructive pulmonary disease, unspecified: Secondary | ICD-10-CM | POA: Diagnosis not present

## 2021-09-21 DIAGNOSIS — R0609 Other forms of dyspnea: Secondary | ICD-10-CM | POA: Diagnosis not present

## 2021-09-26 DIAGNOSIS — J449 Chronic obstructive pulmonary disease, unspecified: Secondary | ICD-10-CM | POA: Diagnosis not present

## 2021-09-29 ENCOUNTER — Other Ambulatory Visit: Payer: Self-pay | Admitting: Nurse Practitioner

## 2021-09-29 DIAGNOSIS — J449 Chronic obstructive pulmonary disease, unspecified: Secondary | ICD-10-CM | POA: Diagnosis not present

## 2021-09-30 NOTE — Telephone Encounter (Signed)
Med sent to pharmacy.

## 2021-10-03 ENCOUNTER — Ambulatory Visit (INDEPENDENT_AMBULATORY_CARE_PROVIDER_SITE_OTHER): Payer: Medicare Other | Admitting: Nurse Practitioner

## 2021-10-03 ENCOUNTER — Encounter: Payer: Self-pay | Admitting: Nurse Practitioner

## 2021-10-03 ENCOUNTER — Other Ambulatory Visit: Payer: Self-pay

## 2021-10-03 VITALS — BP 164/77 | HR 97 | Temp 98.2°F | Resp 16 | Ht 60.0 in | Wt 72.8 lb

## 2021-10-03 DIAGNOSIS — B370 Candidal stomatitis: Secondary | ICD-10-CM

## 2021-10-03 DIAGNOSIS — R251 Tremor, unspecified: Secondary | ICD-10-CM

## 2021-10-03 DIAGNOSIS — I491 Atrial premature depolarization: Secondary | ICD-10-CM | POA: Diagnosis not present

## 2021-10-03 DIAGNOSIS — R002 Palpitations: Secondary | ICD-10-CM

## 2021-10-03 MED ORDER — BISOPROLOL FUMARATE 5 MG PO TABS: 5.0000 mg | ORAL_TABLET | Freq: Every day | ORAL | 2 refills | Status: DC

## 2021-10-03 MED ORDER — NYSTATIN 100000 UNIT/ML MT SUSP: 5.0000 mL | Freq: Four times a day (QID) | OROMUCOSAL | 0 refills | Status: DC

## 2021-10-03 NOTE — Progress Notes (Signed)
[facility] ?[address] ?[place], [place] [address] ? ?Internal MEDICINE  ?Office Visit Note ? ?Patient Name: Christina Hester ? [id number]  ?[id number] ? ?Date of Service: 10/03/2021 ? ?Chief Complaint  ?Patient presents with  ? Acute Visit  ?  Check thyroid, low energy   ? ? ? ?HPI ?Christina Hester presents for an acute sick visit for palpitations and shortness of breath. She reports that she feels jittery like she has a tremor. These symptoms started 2 days ago. She previously stopped taking bupropion but has since restarted taking it more than a month ago. She denies any chest pain. She has had no recent change in her medications or food.  ?EKG 12 lead was done in office today. The EKG showed that she is having frequent PACs, left atrial enlargement, sinus tachycardia and negative precordial T-waves.  ?  ? ?Current Medication: ? ?Outpatient Encounter Medications as of 10/03/2021  ?Medication Sig  ? amLODipine (NORVASC) 2.5 MG tablet Take 1 tablet (2.5 mg total) by mouth daily. Take along with 5 mg daily for total of 7.5 mg.  ? amLODipine (NORVASC) 5 MG tablet Take 1 tablet (5 mg total) by mouth daily. Take along with 2.5 mg for a total of 7.5 mg.  ? azithromycin (ZITHROMAX) 250 MG tablet Take by mouth.  ? bisoprolol (ZEBETA) 5 MG tablet Take 1 tablet (5 mg total) by mouth daily.  ? Budeson-Glycopyrrol-Formoterol (BREZTRI AEROSPHERE) 160-9-4.8 MCG/ACT AERO Inhale 2 puffs into the lungs 2 (two) times daily.  ? buPROPion (WELLBUTRIN) 100 MG tablet Take 1 tablet (100 mg total) by mouth daily.  ? clopidogrel (PLAVIX) 75 MG tablet Take 1 tablet (75 mg total) by mouth daily.  ? clotrimazole (MYCELEX) 10 MG troche Take 1 tablet (10 mg total) by mouth 5 (five) times daily.  ? ipratropium-albuterol (DUONEB) 0.5-2.5 (3) MG/3ML SOLN USE 1 VIAL IN NEBULIZER EVERY 4 HOURS  ? LORazepam (ATIVAN) 0.5 MG tablet TAKE 1/2 TO 1 TABLET BY MOUTH EVERY 8 HOURS AS NEEDED FOR ANXIETY OR SLEEP  ? LORazepam (ATIVAN) 0.5 MG tablet TAKE 1/2  TO 1 TABLET BY MOUTH EVERY 8 HOURS AS NEEDED FOR ANXIETY OR SLEEP  ? meloxicam (MOBIC) 15 MG tablet Take 1 tablet (15 mg total) by mouth daily.  ? nitroGLYCERIN (NITROSTAT) 0.4 MG SL tablet Place 1 tablet (0.4 mg total) under the tongue every 5 (five) minutes as needed for chest pain.  ? nystatin (MYCOSTATIN) 100000 UNIT/ML suspension Take 5 mLs (500,000 Units total) by mouth 4 (four) times daily.  ? spironolactone (ALDACTONE) 25 MG tablet Take 1 tablet by mouth daily.  ? VENTOLIN HFA 108 (90 Base) MCG/ACT inhaler INHALE 2 PUFFS INTO THE LUNGS EVERY 6 HOURS AS NEEDED FOR WHEEZING OR SHORTNESS OF BREATH.  ? [DISCONTINUED] nystatin (MYCOSTATIN) 100000 UNIT/ML suspension Take 5 mLs (500,000 Units total) by mouth 4 (four) times daily.  ? [DISCONTINUED] LORazepam (ATIVAN) 0.5 MG tablet Take by mouth. (Patient not taking: Reported on 10/03/2021)  ? [DISCONTINUED] mirtazapine (REMERON) 7.5 MG tablet Take 1 tablet (7.5 mg total) by mouth at bedtime. (Patient not taking: Reported on 10/03/2021)  ? [DISCONTINUED] oseltamivir (TAMIFLU) 75 MG capsule Take 1 capsule (75 mg total) by mouth 2 (two) times daily. (Patient not taking: Reported on 10/03/2021)  ? [DISCONTINUED] predniSONE (DELTASONE) 10 MG tablet Take one tab 3 x day for 3 days, then take one tab 2 x a day for 3 days and then take one tab a day for 3 days for copd (  Patient not taking: Reported on 10/03/2021)  ? ?No facility-administered encounter medications on file as of 10/03/2021.  ? ? ? ? ?Medical History: ?Past Medical History:  ?Diagnosis Date  ? AAA (abdominal aortic aneurysm) ([place])   ? a. 05/2016 Abd U/S: 3.02 x 3.02 AAA.  ? Anxiety   ? CAD (coronary artery disease)   ? a. 02/2017 NSTEMI/Cath: LM nl, LAD mild dzs, D2 min irregs, LCX mild dzs, OM2 50ost, RCA 95p, 167m L-->R collats, EF 55-65%.  ? [place]) [place]   ? Diastolic dysfunction   ? a. 02/2017 Echo: EF 55-60%, gr1 DD, ? inf HK, mild MR.  ? Hx of basal cell carcinoma   ? back    ? 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 Right lateral breast   ? Hyperlipidemia   ? Hypertension   ? PAD (peripheral artery disease) ([place]   ? a. 05/2016 ABI's: R 1.22, L 1.11.  ? Tobacco abuse   ? ? ? ?Vital Signs: ?BP (!) 164/77   Pulse 97   Temp 98.2 ?F (36.8 ?C)   Resp 16   Ht 5' (1.524 m)   Wt 72 lb 12.8 oz (33 kg)   SpO2 97%   BMI 14.22 kg/m?  ? ? ?Review of Systems  ?Constitutional:  Positive for fatigue. Negative for chills and unexpected weight change.  ?HENT:  Negative for congestion, rhinorrhea, sneezing and sore throat.   ?Eyes:  Negative for redness.  ?Respiratory:  Positive for shortness of breath. Negative for cough, chest tightness and wheezing.   ?Cardiovascular:  Positive for palpitations. Negative for chest pain.  ?Gastrointestinal:  Negative for abdominal pain, constipation, diarrhea, nausea and vomiting.  ?Genitourinary:  Negative for dysuria and frequency.  ?Musculoskeletal:  Negative for arthralgias, back pain, joint swelling and neck pain.  ?Skin:  Negative for rash.  ?Neurological:  Positive for tremors. Negative for numbness.  ?     Feeling jittery  ?Hematological:  Negative for adenopathy. Does not bruise/bleed easily.  ?Psychiatric/Behavioral:  Negative for behavioral problems (Depression), sleep disturbance and suicidal ideas. The patient is not nervous/anxious.   ? ?Physical Exam ?Vitals reviewed.  ?Constitutional:   ?   General: She is awake. She is not in acute distress. ?   Appearance: Normal appearance. She is well-developed, well-groomed and underweight. She is not ill-appearing.  ?HENT:  ?   Head: Normocephalic and atraumatic.  ?Eyes:  ?   Pupils: Pupils are equal, round, and reactive to light.  ?Cardiovascular:  ?   Rate and Rhythm: Tachycardia present. Rhythm irregular.  ?   Heart sounds:  ?  Gallop present.  ?Pulmonary:  ?   Effort: Pulmonary effort is normal. No respiratory distress.  ?   Breath sounds: Normal breath sounds. No wheezing.  ?Neurological:  ?    Mental Status: She is alert and oriented to person, place, and time.  ?Psychiatric:     ?   Mood and Affect: Mood normal.     ?   Behavior: Behavior normal. Behavior is cooperative.  ? ? ? ? ?Assessment/Plan: ?1. Palpitations ?Abnormal EKG with PACs and patient has palpitations and tremor. Labs ordered to rule out other causes, start bisoprolol 5 mg daily. Referred to cardiology for further evaluation.  ?- EKG 12-Lead ?- TSH + free T4 ?- CBC with Differential/Platelet ?- CMP14+EGFR ?- bisoprolol (ZEBETA) 5 MG tablet; Take 1 tablet (5 mg total) by mouth daily.  Dispense: 30 tablet; Refill: 2 ?- Ambulatory referral to  Cardiology ? ?2. Physiological tremor ?12 lead EKG done, was abnormal, labs ordered to rule out causes including hyperthyroidism which has been a problem in the past, as well as abnormal electrolyte levels and abnormalities on CBC.  ?- EKG 12-Lead ?- TSH + free T4 ?- CBC with Differential/Platelet ?- CMP14+EGFR ? ?3. Premature atrial contractions ?Abnormal EKG, frequent PACs, refer to cardiology, start bisoprolol 5 mg daily. May hold 2.5 mg dose of amlodipine but still take the 5 mg dose of amlodipine.  ?- EKG 12-Lead ?- bisoprolol (ZEBETA) 5 MG tablet; Take 1 tablet (5 mg total) by mouth daily.  Dispense: 30 tablet; Refill: 2 ?- Ambulatory referral to Cardiology ? ?4. Oral thrush ?Medication reordered.  ?- nystatin (MYCOSTATIN) 100000 UNIT/ML suspension; Take 5 mLs (500,000 Units total) by mouth 4 (four) times daily.  Dispense: 200 mL; Refill: 0 ? ? ?General Counseling: Rubi verbalizes understanding of the findings of todays visit and agrees with plan of treatment. I have discussed any further diagnostic evaluation that may be needed or ordered today. We also reviewed her medications today. she has been encouraged to call the office with any questions or concerns that should arise related to todays visit. ? ? ? ?Counseling: ? ? ? ?Orders Placed This Encounter  ?Procedures  ? TSH + free T4  ? CBC with  Differential/Platelet  ? CMP14+EGFR  ? Ambulatory referral to Cardiology  ? EKG 12-Lead  ? ? ?Meds ordered this encounter  ?Medications  ? bisoprolol (ZEBETA) 5 MG tablet  ?  Sig: Take 1 tablet (5 mg total) by

## 2021-10-04 ENCOUNTER — Telehealth: Payer: Self-pay

## 2021-10-04 DIAGNOSIS — R251 Tremor, unspecified: Secondary | ICD-10-CM | POA: Diagnosis not present

## 2021-10-04 DIAGNOSIS — R002 Palpitations: Secondary | ICD-10-CM | POA: Diagnosis not present

## 2021-10-04 NOTE — Telephone Encounter (Signed)
Pt daughter called tat he was confused about pt bp med as per alyssa advised that hold amlodipine 2.5 mg and take amlodipine 5 mg and bisoprolol 5 mg and keep track on bp reading also we did referral for cardiology and also call us back with reading  ?

## 2021-10-05 ENCOUNTER — Ambulatory Visit: Payer: Medicare Other | Admitting: Nurse Practitioner

## 2021-10-05 LAB — TSH+FREE T4
Free T4: 1.38 ng/dL (ref 0.82–1.77)
TSH: 2.6 u[IU]/mL (ref 0.450–4.500)

## 2021-10-05 LAB — CBC WITH DIFFERENTIAL/PLATELET
Basophils Absolute: 0 10*3/uL (ref 0.0–0.2)
Basos: 1 %
EOS (ABSOLUTE): 0.1 10*3/uL (ref 0.0–0.4)
Eos: 2 %
Hematocrit: 37 % (ref 34.0–46.6)
Hemoglobin: 12.6 g/dL (ref 11.1–15.9)
Immature Grans (Abs): 0 10*3/uL (ref 0.0–0.1)
Immature Granulocytes: 0 %
Lymphocytes Absolute: 1.3 10*3/uL (ref 0.7–3.1)
Lymphs: 23 %
MCH: 29.9 pg (ref 26.6–33.0)
MCHC: 34.1 g/dL (ref 31.5–35.7)
MCV: 88 fL (ref 79–97)
Monocytes Absolute: 0.6 10*3/uL (ref 0.1–0.9)
Monocytes: 10 %
Neutrophils Absolute: 3.5 10*3/uL (ref 1.4–7.0)
Neutrophils: 64 %
Platelets: 310 10*3/uL (ref 150–450)
RBC: 4.22 x10E6/uL (ref 3.77–5.28)
RDW: 13.3 % (ref 11.7–15.4)
WBC: 5.4 10*3/uL (ref 3.4–10.8)

## 2021-10-05 LAB — CMP14+EGFR
ALT: 19 IU/L (ref 0–32)
AST: 22 IU/L (ref 0–40)
Albumin/Globulin Ratio: 1.9 (ref 1.2–2.2)
Albumin: 4.7 g/dL (ref 3.7–4.7)
Alkaline Phosphatase: 88 IU/L (ref 44–121)
BUN/Creatinine Ratio: 13 (ref 12–28)
BUN: 10 mg/dL (ref 8–27)
Bilirubin Total: 0.3 mg/dL (ref 0.0–1.2)
CO2: 28 mmol/L (ref 20–29)
Calcium: 10 mg/dL (ref 8.7–10.3)
Chloride: 92 mmol/L — ABNORMAL LOW (ref 96–106)
Creatinine, Ser: 0.76 mg/dL (ref 0.57–1.00)
Globulin, Total: 2.5 g/dL (ref 1.5–4.5)
Glucose: 83 mg/dL (ref 70–99)
Potassium: 3.8 mmol/L (ref 3.5–5.2)
Sodium: 136 mmol/L (ref 134–144)
Total Protein: 7.2 g/dL (ref 6.0–8.5)
eGFR: 80 mL/min/{1.73_m2} (ref >59)

## 2021-10-06 ENCOUNTER — Telehealth: Payer: Self-pay

## 2021-10-07 NOTE — Telephone Encounter (Signed)
Per [name] labs are fine, no abnormalities with her thyroid, nothing to indicate to problems she is having. Spoke to pt, she is aware.  ?

## 2021-10-15 ENCOUNTER — Encounter: Payer: Self-pay | Admitting: Nurse Practitioner

## 2021-10-28 ENCOUNTER — Telehealth: Payer: Self-pay

## 2021-10-28 ENCOUNTER — Other Ambulatory Visit: Payer: Self-pay | Admitting: Nurse Practitioner

## 2021-10-28 MED ORDER — ONDANSETRON HCL 4 MG PO TABS: 4.0000 mg | ORAL_TABLET | Freq: Three times a day (TID) | ORAL | 0 refills | Status: DC | PRN

## 2021-10-28 NOTE — Telephone Encounter (Signed)
Spoke with patient's daughter regarding ongoing nausea after eating. Asked [name] to send in Zofran for patient to her pharmacy. Also advised patient to take OTC medication in the meantime if needed. Or, if nausea gets worse to go to the emergency room. ?

## 2021-10-31 ENCOUNTER — Other Ambulatory Visit: Payer: Self-pay | Admitting: Nurse Practitioner

## 2021-10-31 NOTE — Telephone Encounter (Signed)
Refills sent to pharmacy. 

## 2021-11-07 ENCOUNTER — Encounter: Payer: Self-pay | Admitting: Medical

## 2021-11-07 ENCOUNTER — Ambulatory Visit (INDEPENDENT_AMBULATORY_CARE_PROVIDER_SITE_OTHER): Payer: Medicare Other | Admitting: Medical

## 2021-11-07 ENCOUNTER — Ambulatory Visit (INDEPENDENT_AMBULATORY_CARE_PROVIDER_SITE_OTHER): Payer: Medicare Other

## 2021-11-07 VITALS — BP 150/80 | HR 80 | Ht 60.0 in | Wt 73.4 lb

## 2021-11-07 DIAGNOSIS — I6523 Occlusion and stenosis of bilateral carotid arteries: Secondary | ICD-10-CM | POA: Diagnosis not present

## 2021-11-07 DIAGNOSIS — R072 Precordial pain: Secondary | ICD-10-CM | POA: Diagnosis not present

## 2021-11-07 DIAGNOSIS — I7143 Infrarenal abdominal aortic aneurysm, without rupture: Secondary | ICD-10-CM

## 2021-11-07 DIAGNOSIS — R002 Palpitations: Secondary | ICD-10-CM | POA: Diagnosis not present

## 2021-11-07 DIAGNOSIS — I739 Peripheral vascular disease, unspecified: Secondary | ICD-10-CM | POA: Diagnosis not present

## 2021-11-07 DIAGNOSIS — E782 Mixed hyperlipidemia: Secondary | ICD-10-CM

## 2021-11-07 DIAGNOSIS — I2510 Atherosclerotic heart disease of native coronary artery without angina pectoris: Secondary | ICD-10-CM

## 2021-11-07 DIAGNOSIS — J449 Chronic obstructive pulmonary disease, unspecified: Secondary | ICD-10-CM | POA: Diagnosis not present

## 2021-11-07 DIAGNOSIS — I25118 Atherosclerotic heart disease of native coronary artery with other forms of angina pectoris: Secondary | ICD-10-CM

## 2021-11-07 DIAGNOSIS — I10 Essential (primary) hypertension: Secondary | ICD-10-CM | POA: Diagnosis not present

## 2021-11-07 MED ORDER — PANTOPRAZOLE SODIUM 40 MG PO TBEC: 40.0000 mg | DELAYED_RELEASE_TABLET | Freq: Every day | ORAL | 3 refills | Status: DC

## 2021-11-07 NOTE — Patient Instructions (Addendum)
Medication Instructions:  ?- Your physician has recommended you make the following change in your medication:  ? ?1) START Protonix 40 mg: ?- take 1 tablet by mouth once daily  ? ?*If you need a refill on your cardiac medications before your next appointment, please call your pharmacy* ? ? ?Lab Work: ?- none ordered ? ?If you have labs (blood work) drawn today and your tests are completely normal, you will receive your results only by: ?MyChart Message (if you have MyChart) OR ?A paper copy in the mail ?If you have any lab test that is abnormal or we need to change your treatment, we will call you to review the results. ? ? ?Testing/Procedures: ? ?1) Lexiscan Myoview: (chemical stress test/ cardiac nuclear scan) ? ?- Your physician has requested that you have a lexiscan myoview.  ? ?[place] ? ?Your caregiver has ordered a Stress Test with nuclear imaging. The purpose of this test is to evaluate the blood supply to your heart muscle. This procedure is referred to as a "Non-Invasive Stress Test." This is because other than having an IV started in your vein, nothing is inserted or "invades" your body. Cardiac stress tests are done to find areas of poor blood flow to the heart by determining the extent of coronary artery disease (CAD). Some patients exercise on a treadmill, which naturally increases the blood flow to your heart, while others who are  unable to walk on a treadmill due to physical limitations have a pharmacologic/chemical stress agent called Lexiscan . This medicine will mimic walking on a treadmill by temporarily increasing your coronary blood flow.  ? ?Please note: these test may take anywhere between 2-4 hours to complete ? ?PLEASE REPORT TO [facility] MEDICAL MALL ENTRANCE  ?THE VOLUNTEERS AT THE FIRST DESK WILL DIRECT YOU WHERE TO GO ? ?Date of Procedure:_____________________________________ ? ?Arrival Time for Procedure:______________________________ ? ?Instructions regarding medication:  ? ?__xx__ :  Hold Spironolactone the morning of your test ? ?__xx__:  You may take all of your other regular morning medications the day of your test, not listed above ? ? ?PLEASE NOTIFY THE OFFICE AT LEAST 16 HOURS IN ADVANCE IF YOU ARE UNABLE TO KEEP YOUR APPOINTMENT.  [phone] ?AND  ?PLEASE NOTIFY NUCLEAR MEDICINE AT [facility] AT LEAST 51 HOURS IN ADVANCE IF YOU ARE UNABLE TO KEEP YOUR APPOINTMENT. [phone] ? ?How to prepare for your Myoview test: ? ?Do not eat or drink after midnight ?No caffeine for 24 hours prior to test ?No smoking 24 hours prior to test. ?Your medication may be taken with water.  If your doctor stopped a medication because of this test, do not take that medication. ?Ladies, please do not wear dresses.  Skirts or pants are appropriate. Please wear a short sleeve shirt. ?No perfume, cologne or lotion. ?Wear comfortable walking shoes. No heels! ? ? ? ?2) Heart Monitor: ? ?Length of wear: 14 days  ?(we would recommend you not put this on until your stress test is completed)  ?Your monitor will be mailed to you within 3-5 business days. If you have not received your monitor after 5 business days, then please call the office at [phone]/ send a MyChart to let us know. ?Your physician has recommended that you wear a Zio monitor.  ? ?This monitor is a medical device that records the heart?s electrical activity. Doctors most often use these monitors to diagnose arrhythmias. Arrhythmias are problems with the speed or rhythm of the heartbeat. The monitor is a small  device applied to your chest. You can wear one while you do your normal daily activities. While wearing this monitor if you have any symptoms to push the button and record what you felt. Once you have worn this monitor for the period of time provider prescribed (Usually 14 days), you will return the monitor device in the postage paid box. Once it is returned they will download the data collected and provide [place] with a report which the provider  will then review and we will call you with those results. Important tips: ? ?Avoid showering during the first 24 hours of wearing the monitor. ?Avoid excessive sweating to help maximize wear time. ?Do not submerge the device, no hot tubs, and no swimming pools. ?Keep any lotions or oils away from the patch. ?After 24 hours you may shower with the patch on. Take brief showers with your back facing the shower head.  ?Do not remove patch once it has been placed because that will interrupt data and decrease adhesive wear time. ?Push the button when you have any symptoms and write down what you were feeling. ?Once you have completed wearing your monitor, remove and place into box which has postage paid and place in your outgoing mailbox.  ?If for some reason you have misplaced your box then call our office and we can provide another box and/or mail it off for you. ? ? ? ?Follow-Up: ?At [facility], you and your health needs are our priority.  As part of our continuing mission to provide you with exceptional heart care, we have created designated Provider Care Teams.  These Care Teams include your primary Cardiologist (physician) and Advanced Practice Providers (APPs -  Physician Assistants and Nurse Practitioners) who all work together to provide you with the care you need, when you need it. ? ?We recommend signing up for the patient portal called "MyChart".  Sign up information is provided on this After Visit Summary.  MyChart is used to connect with patients for Virtual Visits (Telemedicine).  Patients are able to view lab/test results, encounter notes, upcoming appointments, etc.  Non-urgent messages can be sent to your provider as well.   ?To learn more about what you can do with MyChart, go to [email or web address].   ? ?Your next appointment:   ?6-8 week(s) ? ?The format for your next appointment:   ?In Person ? ?Provider:   ?You may see Kathlyn [place], MD or one of the following Advanced Practice Providers on  your designated Care Team:   ? ?Cadence Kathlen Mody, PA-C  ? ? ?Other Instructions ? ?Pantoprazole Tablets ?What is this medication? ?PANTOPRAZOLE (pan TOE pra zole) treats heartburn, stomach ulcers, reflux disease, or other conditions that cause too much stomach acid. It works by reducing the amount of acid in the stomach. It belongs to a group of medications called PPIs. ?This medicine may be used for other purposes; ask your health care provider or pharmacist if you have questions. ?COMMON BRAND NAME(S): Protonix ?What should I tell my care team before I take this medication? ?They need to know if you have any of these conditions: ?Liver disease ?Low levels of calcium, magnesium, or potassium in the blood ?Lupus ?An unusual or allergic reaction to pantoprazole, other medication, foods, dyes, or preservatives ?Pregnant or trying to get pregnant ?Breast-feeding ?How should I use this medication? ?Take this medication by mouth. Swallow the tablets whole with a drink of water. Follow the directions on the prescription label. Do not crush, break, or chew. Take  your medication at regular intervals. Do not take your medication more often than directed. ?A special MedGuide will be given to you by the pharmacist with each prescription and refill. Be sure to read this information carefully each time. ?Talk to your care team about the use of this medication in children. While this medication may be prescribed for children as young as 5 years for selected conditions, precautions do apply. ?Overdosage: If you think you have taken too much of this medicine contact a poison control center or emergency room at once. ?NOTE: This medicine is only for you. Do not share this medicine with others. ?What if I miss a dose? ?If you miss a dose, take it as soon as you can. If it is almost time for your next dose, take only that dose. Do not take double or extra doses. ?What may interact with this medication? ?Do not take this medication with  any of the following: ?Atazanavir ?Nelfinavir ?This medication may also interact with the following: ?Ampicillin ?Delavirdine ?Erlotinib ?Iron salts ?Medications for fungal infections like ketoconazole

## 2021-11-07 NOTE — Progress Notes (Signed)
?Cardiology Office Note:   ? ?Date:  11/08/2021  ? ?ID:  Christina Hester, DOB Aug 14, 1940, MRN [id number] ? ?PCP:  Jonetta Osgood, NP  ?[facility] Cardiologist:  Dr. Fletcher Anon ?[place] Electrophysiologist:  None  ? ?Referring MD: Jonetta Osgood, NP  ? ?Chief Complaint: 12 month follow-up ? ?History of Present Illness:   ? ?Christina Hester is a 81 y.o. female with a hx of  with a hx of CAD, COPD, carotid disease s/p right carotid endarterectomy, AAA followed by VVS, tobacco abuse presents today for follow-up of coronary artery disease. ? ?Cardiac catheterization 02/2017 with severe one-vessel CAD with chronically occluded RCA with well developed left-to-right collaterals. Mild to moderate nonobstructive disease to left coronary arteries with extremely tortuous vessels. EF normal with mildly elevated LVEDP.  ?  ?Hx of intolerance to aspirin due to ringing in her ears. Tolerates Plavix. Declines statins due to fear of side effects. Trialed Zeta, but reported leg pain.  ?  ?Seen by Dr. Lucky Cowboy 10/21/19. She had a CT angiogram with official report suggesting 55% right ICA stenosis and 50% left ICA stenosis. However, on review by Dr. Lucky Cowboy and by Dr. Kathlene Cote (main vascular radiologist at [facility]) estimate 70-75% stenosis of right ICA. Recent carotid duplex suggestive of high grade right carotid artery stenosis.  ?  ?Seen 10/24/2019 she was seen for cardiovascular clearance for right carotid endarterectomy.  She was staying active walking her dogs. ?  ?Echo 10/14/20 at outside facility with LV chamber size decreased, no RWMA, LVEF 63%, mild diastolic dysfunction, RV normal size, bilateral atria normal size, mild aortic valve sclerosis without stenosis, mild MR.  ? ?Last seen 11/05/20 nd was overall doing well from a cardiac standpoint. Reported worsening COPD. NO changes were made.  ?  ?Today, the patient reports she was sent by her PCP for elevated heart rate and chest pain. They said the heart rate is elevated over 100  frequently, however unsure of how high. PCP started her on bisoprolol '5mg'$ , which has helped her symptoms. PCP note reports PACs. She feels some fluttering, no significant palpitations. Also has some chest pain, however thought this was indigestion. Feels very fatigued. She reports swelling in her feet and hands at times, says lower leg edema is dependent. Also hands are cold all the time. The patient quit smoking 2 years ago. She drinks 2-3 cups of coffee daily. No alcohol use. She lives with her daughter. She is fairly active, does not use a cane or walker.  ? ?She had an echo last month showing normal pump function. Labs by PCP last month were unremarkable.  ? ?Past Medical History:  ?Diagnosis Date  ? AAA (abdominal aortic aneurysm) ([place])   ? a. 05/2016 Abd U/S: 3.02 x 3.02 AAA.  ? Anxiety   ? CAD (coronary artery disease)   ? a. 02/2017 NSTEMI/Cath: LM nl, LAD mild dzs, D2 min irregs, LCX mild dzs, OM2 50ost, RCA 95p, 151m L-->R collats, EF 55-65%.  ? COPD (chronic obstructive pulmonary disease) ([place]   ? Diastolic dysfunction   ? a. 02/2017 Echo: EF 55-60%, gr1 DD, ? inf HK, mild MR.  ? Hx of basal cell carcinoma   ? back   ? 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 Right lateral breast   ? Hyperlipidemia   ? Hypertension   ? PAD (peripheral artery disease) ([place]   ? a. 05/2016 ABI's: R 1.22, L 1.11.  ? Tobacco abuse   ? ? ?Past Surgical History:  ?  Procedure Laterality Date  ? ABDOMINAL HYSTERECTOMY    ? APPENDECTOMY    ? BREAST EXCISIONAL BIOPSY Left 80's or 90's  ? NEG  ? CARDIAC CATHETERIZATION    ? COLONOSCOPY N/A 05/22/2018  ? Procedure: COLONOSCOPY;  Surgeon: Lin Landsman, MD;  Location: [facility] ENDOSCOPY;  Service: Gastroenterology;  Laterality: N/A;  ? COLONOSCOPY WITH PROPOFOL N/A 07/19/2020  ? Procedure: COLONOSCOPY WITH PROPOFOL;  Surgeon: Toledo, Benay Pike, MD;  Location: [facility] ENDOSCOPY;  Service: Gastroenterology;  Laterality: N/A;  ? ENDARTERECTOMY Right 11/06/2019  ? Procedure: ENDARTERECTOMY  CAROTID;  Surgeon: Algernon Huxley, MD;  Location: [facility] ORS;  Service: Vascular;  Laterality: Right;  ? LEFT HEART CATH Bilateral 03/05/2017  ? Procedure: Left Heart Cath with poss PCI;  Surgeon: Wellington Hampshire, MD;  Location: [place] CV LAB;  Service: Cardiovascular;  Laterality: Bilateral;  ? ? ?Current Medications: ?Current Meds  ?Medication Sig  ? amLODipine (NORVASC) 5 MG tablet Take 1 tablet (5 mg total) by mouth daily. Take along with 2.5 mg for a total of 7.5 mg.  ? bisoprolol (ZEBETA) 5 MG tablet Take 5 mg by mouth daily.  ? Budeson-Glycopyrrol-Formoterol (BREZTRI AEROSPHERE) 160-9-4.8 MCG/ACT AERO Inhale 2 puffs into the lungs 2 (two) times daily.  ? buPROPion (WELLBUTRIN) 100 MG tablet Take 1 tablet (100 mg total) by mouth daily.  ? clopidogrel (PLAVIX) 75 MG tablet Take 1 tablet (75 mg total) by mouth daily.  ? clotrimazole (MYCELEX) 10 MG troche Take 1 tablet (10 mg total) by mouth 5 (five) times daily.  ? ipratropium-albuterol (DUONEB) 0.5-2.5 (3) MG/3ML SOLN USE 1 VIAL IN NEBULIZER EVERY 4 HOURS  ? LORazepam (ATIVAN) 0.5 MG tablet TAKE 1/2 TO 1 TABLET BY MOUTH EVERY 8 HOURS AS NEEDED FOR ANXIETY OR SLEEP  ? meloxicam (MOBIC) 15 MG tablet Take 1 tablet (15 mg total) by mouth daily.  ? nitroGLYCERIN (NITROSTAT) 0.4 MG SL tablet Place 1 tablet (0.4 mg total) under the tongue every 5 (five) minutes as needed for chest pain.  ? nystatin (MYCOSTATIN) 100000 UNIT/ML suspension Take 5 mLs (500,000 Units total) by mouth 4 (four) times daily.  ? ondansetron (ZOFRAN) 4 MG tablet Take 1 tablet (4 mg total) by mouth every 8 (eight) hours as needed for nausea or vomiting.  ? pantoprazole (PROTONIX) 40 MG tablet Take 1 tablet (40 mg total) by mouth daily.  ? spironolactone (ALDACTONE) 25 MG tablet Take 1 tablet by mouth daily.  ? VENTOLIN HFA 108 (90 Base) MCG/ACT inhaler INHALE 2 PUFFS INTO THE LUNGS EVERY 6 HOURS AS NEEDED FOR WHEEZING OR SHORTNESS OF BREATH.  ?  ? ?Allergies:   Aspirin, Budesonide-formoterol  fumarate, Prednisone, Sulfa antibiotics, and Tramadol  ? ?Social History  ? ?Socioeconomic History  ? Marital status: Divorced  ?  Spouse name: Not on file  ? Number of children: Not on file  ? Years of education: Not on file  ? Highest education level: Not on file  ?Occupational History  ? Not on file  ?Tobacco Use  ? Smoking status: Former  ?  Packs/day: 0.25  ?  Types: Cigarettes  ?  Quit date: 07/12/2017  ?  Years since quitting: 4.3  ? Smokeless tobacco: Never  ? Tobacco comments:  ?  currently 90 days without smoking  ?Vaping Use  ? Vaping Use: Never used  ?Substance and Sexual Activity  ? Alcohol use: No  ? Drug use: No  ? Sexual activity: Not on file  ?Other Topics Concern  ?  Not on file  ?Social History Narrative  ? Not on file  ? ?Social Determinants of Health  ? ?Financial Resource Strain: Not on file  ?Food Insecurity: Not on file  ?Transportation Needs: Not on file  ?Physical Activity: Not on file  ?Stress: Not on file  ?Social Connections: Not on file  ?  ? ?Family History: ?The patient's family history includes Breast cancer in her maternal aunt, maternal aunt, maternal aunt, and mother; CVA in her father; Dementia in her mother; Hypertension in her mother. ? ?ROS:   ?Please see the history of present illness.    ? All other systems reviewed and are negative. ? ?EKGs/Labs/Other Studies Reviewed:   ? ?The following studies were reviewed today: ? ?Echo 09/21/21 ?ECHOCARDIOGRAPHIC DESCRIPTIONS  ?AORTIC ROOT  ?                 Size: Normal  ?           Dissection: INDETERM FOR DISSECTION  ?AORTIC VALVE  ?             Leaflets: Tricuspid                   Morphology: MILDLY THICKENED  ?             Mobility: Fully mobile  ?LEFT VENTRICLE  ?                 Size: Normal                        Anterior: Normal  ?          Contraction: Normal                         Lateral: Normal  ?           Closest EF: >55% (Estimated)                Septal: Normal  ?            LV Masses: No Masses                        Apical: Normal  ?                  LVH: None                          Inferior: Normal  ?                                                    Posterior: Normal  ?         Dias.FxClass: (Grade 1) relaxation ab

## 2021-11-14 ENCOUNTER — Telehealth: Payer: Self-pay | Admitting: Medical

## 2021-11-14 NOTE — Telephone Encounter (Signed)
Returned the patients. ?Pt wanted to confirm whether or not it would be ok for her to take her COPD medications the morning of her Lexiscan. ?Adv the pt that she can. Pt sts that she does get anxious and will bring her ativan and rescue inhaler if needed. ?Pt verbalized understanding to the info given and voiced appreciation for the call back. ? ?

## 2021-11-14 NOTE — Telephone Encounter (Signed)
New message: ? ? ? ?Patient is having a test tomorrow at the hospital. She have some questions about taking her medicine. ?

## 2021-11-15 ENCOUNTER — Ambulatory Visit
Admission: RE | Admit: 2021-11-15 | Discharge: 2021-11-15 | Disposition: A | Discharge status: Home / Self Care | Payer: Medicare Other | Source: Ambulatory Visit | Attending: Medical | Admitting: Medical

## 2021-11-15 DIAGNOSIS — R072 Precordial pain: Secondary | ICD-10-CM | POA: Diagnosis not present

## 2021-11-15 DIAGNOSIS — Z20822 Contact with and (suspected) exposure to covid-19: Secondary | ICD-10-CM | POA: Diagnosis not present

## 2021-11-15 LAB — NM MYOCAR MULTI W/SPECT W/WALL MOTION / EF
Base ST Depression (mm): 0 mm
LV dias vol: 33 mL (ref 46–106)
LV sys vol: 15 mL
Nuc Stress EF: 55 %
Peak HR: 101 {beats}/min
Percent HR: 71 %
Rest HR: 79 {beats}/min
Rest Nuclear Isotope Dose: 10.1 mCi
SDS: 0
SRS: 0
SSS: 0
ST Depression (mm): 0 mm
Stress Nuclear Isotope Dose: 31.9 mCi
TID: 0.75

## 2021-11-15 MED ORDER — REGADENOSON 0.4 MG/5ML IV SOLN
0.4000 mg | Freq: Once | INTRAVENOUS | Status: AC
  Administered 2021-11-15: 0.4 mg via INTRAVENOUS
  Filled 2021-11-15: qty 5

## 2021-11-15 MED ORDER — TECHNETIUM TC 99M TETROFOSMIN IV KIT
30.0000 | PACK | Freq: Once | INTRAVENOUS | Status: AC
  Administered 2021-11-15: 31.85 via INTRAVENOUS

## 2021-11-15 MED ORDER — TECHNETIUM TC 99M TETROFOSMIN IV KIT
10.1100 | PACK | Freq: Once | INTRAVENOUS | Status: AC | PRN
  Administered 2021-11-15: 10.11 via INTRAVENOUS

## 2021-11-17 DIAGNOSIS — Z20822 Contact with and (suspected) exposure to covid-19: Secondary | ICD-10-CM | POA: Diagnosis not present

## 2021-11-18 ENCOUNTER — Telehealth: Payer: Self-pay

## 2021-11-18 NOTE — Telephone Encounter (Signed)
-----   Message from [place], PA-C sent at 11/17/2021  3:12 PM EDT ----- ?Myoview stress test showed normal perfusion without ischemia or scar. Overall low risk. Coronary artery calcifications on CT imaging, already on plavix. I would like to start Crestor '40mg'$  daily for cholesterol.  ?

## 2021-11-18 NOTE — Telephone Encounter (Signed)
Patient made aware of stress test results with verbalized understanding. ? ?Patient declines starting crestor. Pt sts that she is unable to tolerate any statins and has tried and failed zetia in the past. ? ?Patient is scheduled to see Cadence in June. Adv her that they could discuss management of her cholesterol at that time. ?Patient agreeable with the plan and voiced appreciation for the call. ?

## 2021-11-21 DIAGNOSIS — R002 Palpitations: Secondary | ICD-10-CM | POA: Diagnosis not present

## 2021-11-25 DIAGNOSIS — M542 Cervicalgia: Secondary | ICD-10-CM | POA: Diagnosis not present

## 2021-11-25 DIAGNOSIS — M47812 Spondylosis without myelopathy or radiculopathy, cervical region: Secondary | ICD-10-CM | POA: Diagnosis not present

## 2021-11-25 DIAGNOSIS — G8929 Other chronic pain: Secondary | ICD-10-CM | POA: Diagnosis not present

## 2021-11-25 DIAGNOSIS — M545 Low back pain, unspecified: Secondary | ICD-10-CM | POA: Diagnosis not present

## 2021-11-25 DIAGNOSIS — M47816 Spondylosis without myelopathy or radiculopathy, lumbar region: Secondary | ICD-10-CM | POA: Diagnosis not present

## 2021-12-12 DIAGNOSIS — R002 Palpitations: Secondary | ICD-10-CM | POA: Diagnosis not present

## 2021-12-29 ENCOUNTER — Other Ambulatory Visit: Payer: Self-pay | Admitting: Nurse Practitioner

## 2021-12-29 DIAGNOSIS — I200 Unstable angina: Secondary | ICD-10-CM

## 2021-12-29 DIAGNOSIS — R300 Dysuria: Secondary | ICD-10-CM

## 2021-12-29 DIAGNOSIS — F411 Generalized anxiety disorder: Secondary | ICD-10-CM

## 2021-12-29 DIAGNOSIS — Z0001 Encounter for general adult medical examination with abnormal findings: Secondary | ICD-10-CM

## 2021-12-29 DIAGNOSIS — I10 Essential (primary) hypertension: Secondary | ICD-10-CM

## 2021-12-29 DIAGNOSIS — J449 Chronic obstructive pulmonary disease, unspecified: Secondary | ICD-10-CM

## 2021-12-29 DIAGNOSIS — M2550 Pain in unspecified joint: Secondary | ICD-10-CM

## 2021-12-29 DIAGNOSIS — I6523 Occlusion and stenosis of bilateral carotid arteries: Secondary | ICD-10-CM

## 2022-01-03 ENCOUNTER — Ambulatory Visit (INDEPENDENT_AMBULATORY_CARE_PROVIDER_SITE_OTHER): Payer: Medicare Other | Admitting: Medical

## 2022-01-03 ENCOUNTER — Encounter: Payer: Self-pay | Admitting: Medical

## 2022-01-03 VITALS — BP 140/70 | HR 70 | Ht 60.0 in | Wt 77.2 lb

## 2022-01-03 DIAGNOSIS — I10 Essential (primary) hypertension: Secondary | ICD-10-CM

## 2022-01-03 DIAGNOSIS — R002 Palpitations: Secondary | ICD-10-CM

## 2022-01-03 DIAGNOSIS — R0609 Other forms of dyspnea: Secondary | ICD-10-CM | POA: Diagnosis not present

## 2022-01-03 DIAGNOSIS — I7143 Infrarenal abdominal aortic aneurysm, without rupture: Secondary | ICD-10-CM

## 2022-01-03 DIAGNOSIS — I739 Peripheral vascular disease, unspecified: Secondary | ICD-10-CM

## 2022-01-03 DIAGNOSIS — J449 Chronic obstructive pulmonary disease, unspecified: Secondary | ICD-10-CM

## 2022-01-03 NOTE — Progress Notes (Unsigned)
Cardiology Office Note:    Date:  0/97/3532   ID:  Christina CIBRIAN, DOB 01-Aug-1940, MRN [id number]  PCP:  Jonetta Osgood, NP  [facility] Cardiologist:  Kathlyn [place], MD  [facility] Electrophysiologist:  None   Referring MD: Jonetta Osgood, NP   Chief Complaint: 2 month follow-up  History of Present Illness:    Christina Hester is a 81 y.o. female with a hx of Christina Hester is a 81 y.o. female with a hx of  with a hx of CAD, COPD, carotid disease s/p right carotid endarterectomy, AAA followed by VVS, tobacco abuse presents today for follow-up of coronary artery disease.   Cardiac catheterization 02/2017 with severe one-vessel CAD with chronically occluded RCA with well developed left-to-right collaterals. Mild to moderate nonobstructive disease to left coronary arteries with extremely tortuous vessels. EF normal with mildly elevated LVEDP.    Hx of intolerance to aspirin due to ringing in her ears. Tolerates Plavix. Declines statins due to fear of side effects. Trialed Zeta, but reported leg pain.    Seen by Dr. Lucky Cowboy 10/21/19. She had a CT angiogram with official report suggesting 55% right ICA stenosis and 50% left ICA stenosis. However, on review by Dr. Lucky Cowboy and by Dr. Kathlene Cote (main vascular radiologist at [facility]) estimate 70-75% stenosis of right ICA. Recent carotid duplex suggestive of high grade right carotid artery stenosis.    Seen 10/24/2019 she was seen for cardiovascular clearance for right carotid endarterectomy.  She was staying active walking her dogs.   Echo 10/14/20 at outside facility with LV chamber size decreased, no RWMA, LVEF 19%, mild diastolic dysfunction, RV normal size, bilateral atria normal size, mild aortic valve sclerosis without stenosis, mild MR.   She had an echo 09/2021 showing normal pump function.  Last seen 10/2021 for chest pain and elevated heart rate. A myoview was ordered and a heart monitor was ordered. She was started on Protonix.    Today, the heart monitor and myoview were reviewed. She reports she has no chest pain. She has chronic SOB from mod to severe COPD. Weight is up a litte, trying to eat more. She is walking twice daily with the dogs, no chest pain with this. No LLE, orthopnea, or pnd.   Past Medical History:  Diagnosis Date   AAA (abdominal aortic aneurysm) ([place])    a. 05/2016 Abd U/S: 3.02 x 3.02 AAA.   Anxiety    CAD (coronary artery disease)    a. 02/2017 NSTEMI/Cath: LM nl, LAD mild dzs, D2 min irregs, LCX mild dzs, OM2 50ost, RCA 95p, 131m L-->R collats, EF 55-65%.   COPD (chronic obstructive pulmonary disease) ([facility])    Diastolic dysfunction    a. 02/2017 Echo: EF 55-60%, gr1 DD, ? inf HK, mild MR.   Hx of basal cell carcinoma    back    Hx of squamous cell carcinoma 12/02/2018   Right lateral breast    Hyperlipidemia    Hypertension    PAD (peripheral artery disease) ([place]    a. 05/2016 ABI's: R 1.22, L 1.11.   Tobacco abuse     Past Surgical History:  Procedure Laterality Date   ABDOMINAL HYSTERECTOMY     APPENDECTOMY     BREAST EXCISIONAL BIOPSY Left 80's or 90's   NEG   CARDIAC CATHETERIZATION     COLONOSCOPY N/A 05/22/2018   Procedure: COLONOSCOPY;  Surgeon: VLin Landsman MD;  Location: [facility] ENDOSCOPY;  Service: Gastroenterology;  Laterality: N/A;   COLONOSCOPY WITH  PROPOFOL N/A 07/19/2020   Procedure: COLONOSCOPY WITH PROPOFOL;  Surgeon: Toledo, Benay Pike, MD;  Location: [facility] ENDOSCOPY;  Service: Gastroenterology;  Laterality: N/A;   ENDARTERECTOMY Right 11/06/2019   Procedure: ENDARTERECTOMY CAROTID;  Surgeon: Algernon Huxley, MD;  Location: [facility] ORS;  Service: Vascular;  Laterality: Right;   LEFT HEART CATH Bilateral 03/05/2017   Procedure: Left Heart Cath with poss PCI;  Surgeon: Wellington Hampshire, MD;  Location: [place] CV LAB;  Service: Cardiovascular;  Laterality: Bilateral;    Current Medications: Current Meds  Medication Sig   amLODipine (NORVASC) 5 MG tablet Take  1 tablet (5 mg total) by mouth daily. Take along with 2.5 mg for a total of 7.5 mg.   azithromycin (ZITHROMAX) 250 MG tablet Take 1 tablet by mouth every Monday, Wednesday, and Friday.   benzonatate (TESSALON) 200 MG capsule Take by mouth as needed.   bisoprolol (ZEBETA) 5 MG tablet TAKE 1 TABLET BY MOUTH ONCE A DAY   Budeson-Glycopyrrol-Formoterol (BREZTRI AEROSPHERE) 160-9-4.8 MCG/ACT AERO Inhale 2 puffs into the lungs 2 (two) times daily.   buPROPion (WELLBUTRIN) 100 MG tablet TAKE 1 TABLET BY MOUTH ONCE A DAY   clopidogrel (PLAVIX) 75 MG tablet Take 1 tablet (75 mg total) by mouth daily.   clotrimazole (MYCELEX) 10 MG troche Take 1 tablet (10 mg total) by mouth 5 (five) times daily.   clotrimazole (MYCELEX) 10 MG troche Take 10 mg by mouth as needed.   ipratropium-albuterol (DUONEB) 0.5-2.5 (3) MG/3ML SOLN USE 1 VIAL IN NEBULIZER EVERY 4 HOURS   LORazepam (ATIVAN) 0.5 MG tablet TAKE 1/2 TO 1 TABLET BY MOUTH EVERY 8 HOURS AS NEEDED FOR ANXIETY OR SLEEP   meloxicam (MOBIC) 15 MG tablet Take 1 tablet (15 mg total) by mouth daily.   nitroGLYCERIN (NITROSTAT) 0.4 MG SL tablet Place 1 tablet (0.4 mg total) under the tongue every 5 (five) minutes as needed for chest pain.   nystatin (MYCOSTATIN) 100000 UNIT/ML suspension Take 5 mLs (500,000 Units total) by mouth 4 (four) times daily.   ondansetron (ZOFRAN) 4 MG tablet Take 1 tablet (4 mg total) by mouth every 8 (eight) hours as needed for nausea or vomiting.   pantoprazole (PROTONIX) 40 MG tablet Take 1 tablet (40 mg total) by mouth daily.   VENTOLIN HFA 108 (90 Base) MCG/ACT inhaler INHALE 2 PUFFS INTO THE LUNGS EVERY 6 HOURS AS NEEDED FOR WHEEZING OR SHORTNESS OF BREATH.     Allergies:   Aspirin, Budesonide-formoterol fumarate, Prednisone, Sulfa antibiotics, and Tramadol   Social History   Socioeconomic History   Marital status: Divorced    Spouse name: Not on file   Number of children: Not on file   Years of education: Not on file    Highest education level: Not on file  Occupational History   Not on file  Tobacco Use   Smoking status: Former    Packs/day: 0.25    Types: Cigarettes    Quit date: 07/12/2017    Years since quitting: 4.4   Smokeless tobacco: Never   Tobacco comments:    currently 90 days without smoking  Vaping Use   Vaping Use: Never used  Substance and Sexual Activity   Alcohol use: No   Drug use: No   Sexual activity: Not on file  Other Topics Concern   Not on file  Social History Narrative   Not on file   Social Determinants of Health   Financial Resource Strain: Not on file  Food Insecurity: Not  on file  Transportation Needs: Not on file  Physical Activity: Not on file  Stress: Not on file  Social Connections: Not on file     Family History: The patient's family history includes Breast cancer in her maternal aunt, maternal aunt, maternal aunt, and mother; CVA in her father; Dementia in her mother; Hypertension in her mother.  ROS:   Please see the history of present illness.     All other systems reviewed and are negative.  EKGs/Labs/Other Studies Reviewed:    The following studies were reviewed today:  Heart monitor 11/2021 Patch Wear Time:  12 days and 22 hours (2023-05-08T17:09:34-0400 to 2023-05-21T15:19:27-0400)   Patient had a min HR of 55 bpm, max HR of 179 bpm, and avg HR of 72 bpm. Predominant underlying rhythm was Sinus Rhythm.  14 Supraventricular Tachycardia runs occurred, the run with the fastest interval lasting 5 beats with a max rate of 179 bpm, the longest lasting 14 beats with an avg rate of 100 bpm. Rare PACs and rare PVCs.  Lexiscan Myoview 11/2021 Narrative & Impression      Normal pharmacologic myocardial perfusion stress test without evidence of significant ischemia or scar.   Left ventricular systolic function is normal.   Coronary artery and aortic valvular calcifications are noted, as well as aortic atherosclerosis.   Emphysematous changes are  noted in the visualized lungs on the attenuation correction CT.   This is a low risk study.     Echo 09/2020   EKG:  EKG is not  ordered today.    Recent Labs: 10/04/2021: ALT 19; BUN 10; Creatinine, Ser 0.76; Hemoglobin 12.6; Platelets 310; Potassium 3.8; Sodium 136; TSH 2.600  Recent Lipid Panel    Component Value Date/Time   CHOL 262 (H) 02/08/2021 0741   TRIG 56 02/08/2021 0741   HDL 103 02/08/2021 0741   CHOLHDL 2.6 11/26/2015 0412   VLDL 6 11/26/2015 0412   LDLCALC 150 (H) 02/08/2021 0741     Physical Exam:    VS:  BP 140/70 (BP Location: Right Arm, Patient Position: Sitting, Cuff Size: Normal)   Pulse 70   Ht 5' (1.524 m)   Wt 77 lb 3.2 oz (35 kg)   SpO2 96%   BMI 15.08 kg/m     Wt Readings from Last 3 Encounters:  01/03/22 77 lb 3.2 oz (35 kg)  11/07/21 73 lb 6 oz (33.3 kg)  10/03/21 72 lb 12.8 oz (33 kg)     GEN:  Well nourished, well developed in no acute distress HEENT: Normal NECK: No JVD; No carotid bruits LYMPHATICS: No lymphadenopathy CARDIAC: RRR, no murmurs, rubs, gallops RESPIRATORY:  diffusely diminished  ABDOMEN: Soft, non-tender, non-distended MUSCULOSKELETAL:  No edema; No deformity  SKIN: Warm and dry NEUROLOGIC:  Alert and oriented x 3 PSYCHIATRIC:  Normal affect   ASSESSMENT:    1. DOE (dyspnea on exertion)   2. Palpitations   3. Essential hypertension   4. Chronic obstructive pulmonary disease, unspecified COPD type ([place])   5. PVD (peripheral vascular disease) ([place])   6. Infrarenal abdominal aortic aneurysm (AAA) without rupture ([facility])    PLAN:    In order of problems listed above:  DOE Myoview with no ischemia, low risk, it did show coronary calcifications.  Continue plavix and bisoprolol Allergy to ASA. Recommend pulmonology f/u.   Palpitations Heart monitor showed predominately NSR with 14 short runs of SVT with rare PAS/PVCs. Overall reassuring. No further palpitations. Continue bisoprolol.  HTN BP reasonable.  Continue  bisoprolol '5mg'$  daily and amlodipine daily.   COPD Stable, followed by [facility] pulmonology. She quit smoking 2 years ago.   HLD LDL 150 01/2021. She has statin intolerance. We dicussed Repatha, but she would like to wait.   PVD Right ABI 0.93 and Left ABI 0.77 in 08/2021. Continues to follow with [facility].   AAA Duplex US 08/2021 showed AAA 3.4cm, essentially unchanged from prior. Followed by [facility].  Disposition: Follow up in 6 month(s) with MD/APP     Signed, Shamar Engelmann Ninfa Meeker, PA-C  01/03/2022 4:19 PM    [facility]

## 2022-01-03 NOTE — Patient Instructions (Signed)
Medication Instructions:   Your physician recommends that you continue on your current medications as directed. Please refer to the Current Medication list given to you today.  *If you need a refill on your cardiac medications before your next appointment, please call your pharmacy*   Lab Work:  None ordered  Testing/Procedures:  None ordered   Follow-Up: At [facility], you and your health needs are our priority.  As part of our continuing mission to provide you with exceptional heart care, we have created designated Provider Care Teams.  These Care Teams include your primary Cardiologist (physician) and Advanced Practice Providers (APPs -  Physician Assistants and Nurse Practitioners) who all work together to provide you with the care you need, when you need it.  We recommend signing up for the patient portal called "MyChart".  Sign up information is provided on this After Visit Summary.  MyChart is used to connect with patients for Virtual Visits (Telemedicine).  Patients are able to view lab/test results, encounter notes, upcoming appointments, etc.  Non-urgent messages can be sent to your provider as well.   To learn more about what you can do with MyChart, go to [email or web address].    Your next appointment:   6 month(s)  The format for your next appointment:   In Person  Provider:   You may see Dr. Fletcher Anon or one of the following Advanced Practice Providers on your designated Care Team:   Murray Hodgkins, NP Christell Faith, PA-C Cadence Kathlen Mody, PA-C{   Important Information About Sugar

## 2022-01-11 DIAGNOSIS — F17218 Nicotine dependence, cigarettes, with other nicotine-induced disorders: Secondary | ICD-10-CM | POA: Diagnosis not present

## 2022-01-11 DIAGNOSIS — J449 Chronic obstructive pulmonary disease, unspecified: Secondary | ICD-10-CM | POA: Diagnosis not present

## 2022-01-12 ENCOUNTER — Other Ambulatory Visit: Payer: Self-pay | Admitting: Nurse Practitioner

## 2022-01-12 ENCOUNTER — Telehealth: Payer: Self-pay

## 2022-01-12 NOTE — Telephone Encounter (Signed)
Spoke to pt's daughter Christina Hester, pt was prescribed Mirapex by her pulmonologist at [facility]) pt is experiencing nausea, vomiting and dizziness, symptoms started last night. Advised pt stop the med (which she already has) and to call the provider that prescribed the med so they can change it / take care of it.

## 2022-01-19 ENCOUNTER — Ambulatory Visit (INDEPENDENT_AMBULATORY_CARE_PROVIDER_SITE_OTHER): Payer: Medicare Other | Admitting: Nurse Practitioner

## 2022-01-19 ENCOUNTER — Encounter: Payer: Self-pay | Admitting: Nurse Practitioner

## 2022-01-19 VITALS — BP 140/70 | HR 65 | Temp 98.3°F | Resp 16 | Ht 60.0 in | Wt 72.4 lb

## 2022-01-19 DIAGNOSIS — I2583 Coronary atherosclerosis due to lipid rich plaque: Secondary | ICD-10-CM

## 2022-01-19 DIAGNOSIS — F411 Generalized anxiety disorder: Secondary | ICD-10-CM

## 2022-01-19 DIAGNOSIS — F5101 Primary insomnia: Secondary | ICD-10-CM | POA: Diagnosis not present

## 2022-01-19 DIAGNOSIS — I6523 Occlusion and stenosis of bilateral carotid arteries: Secondary | ICD-10-CM | POA: Diagnosis not present

## 2022-01-19 DIAGNOSIS — I10 Essential (primary) hypertension: Secondary | ICD-10-CM

## 2022-01-19 DIAGNOSIS — I2510 Atherosclerotic heart disease of native coronary artery without angina pectoris: Secondary | ICD-10-CM | POA: Diagnosis not present

## 2022-01-19 DIAGNOSIS — J449 Chronic obstructive pulmonary disease, unspecified: Secondary | ICD-10-CM | POA: Diagnosis not present

## 2022-01-19 DIAGNOSIS — Z0001 Encounter for general adult medical examination with abnormal findings: Secondary | ICD-10-CM

## 2022-01-19 DIAGNOSIS — R300 Dysuria: Secondary | ICD-10-CM

## 2022-01-19 MED ORDER — VENTOLIN HFA 108 (90 BASE) MCG/ACT IN AERS: INHALATION_SPRAY | RESPIRATORY_TRACT | 3 refills | Status: DC

## 2022-01-19 MED ORDER — BUPROPION HCL 100 MG PO TABS: 100.0000 mg | ORAL_TABLET | Freq: Every day | ORAL | 3 refills | Status: DC

## 2022-01-19 MED ORDER — BISOPROLOL FUMARATE 5 MG PO TABS: 5.0000 mg | ORAL_TABLET | Freq: Every day | ORAL | 3 refills | Status: DC

## 2022-01-19 MED ORDER — AMLODIPINE BESYLATE 5 MG PO TABS: 5.0000 mg | ORAL_TABLET | Freq: Every day | ORAL | 3 refills | Status: DC

## 2022-01-19 MED ORDER — ONDANSETRON HCL 4 MG PO TABS: 4.0000 mg | ORAL_TABLET | Freq: Three times a day (TID) | ORAL | 0 refills | Status: DC | PRN

## 2022-01-19 MED ORDER — LORAZEPAM 0.5 MG PO TABS: ORAL_TABLET | ORAL | 2 refills | Status: DC

## 2022-01-19 MED ORDER — CLOPIDOGREL BISULFATE 75 MG PO TABS: 75.0000 mg | ORAL_TABLET | Freq: Every day | ORAL | 3 refills | Status: DC

## 2022-01-19 NOTE — Progress Notes (Signed)
[facility] [place], [place] [address]  Internal MEDICINE  Office Visit Note  Patient Name: Christina Hester  [id number]  [id number]  Date of Service: 01/19/2022  Chief Complaint  Patient presents with   Medicare Wellness   Anxiety   COPD   Hyperlipidemia   Hypertension    HPI Christina Hester presents for an annual well visit and physical exam.  She is a well-appearing 81 year old female with anxiety, advanced COPD, hyperlipidemia, hypertension, AAA, history of skin cancer, CAD, hx of MI, PAD.  She is retired, lives at home with her daughter.  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  Patient reports that her breathing is stable right now and her pulmonologist has her doing nebulizer treatments every 4 hours because it is more affordable than using the Breztri inhaler. Due for repeat lipid panel, all other labs have been done recently. No preventive screenings are due at this time.  BP and other vital signs are stable and within normal limits. Patient remains underweight but has not lost any more weight. She continues to try to eat more but cannot seem to gain any weight. Thyroid levels were find last time they were checked.  She has no other questions or concerns.  She denies any new or worsening pain    Current Medication: Outpatient Encounter Medications as of 01/19/2022  Medication Sig   azithromycin (ZITHROMAX) 250 MG tablet Take 1 tablet by mouth every Monday, Wednesday, and Friday.   benzonatate (TESSALON) 200 MG capsule Take by mouth as needed.   clotrimazole (MYCELEX) 10 MG troche Take 1 tablet (10 mg total) by mouth 5 (five) times daily.   clotrimazole (MYCELEX) 10 MG troche Take 10 mg by mouth as needed.   ipratropium-albuterol (DUONEB) 0.5-2.5 (3) MG/3ML SOLN USE 1 VIAL IN NEBULIZER EVERY 4 HOURS   meloxicam (MOBIC) 15 MG tablet Take 1 tablet (15 mg total) by mouth daily.   nitroGLYCERIN (NITROSTAT) 0.4 MG SL tablet Place 1 tablet (0.4 mg total) under the  tongue every 5 (five) minutes as needed for chest pain.   nystatin (MYCOSTATIN) 100000 UNIT/ML suspension Take 5 mLs (500,000 Units total) by mouth 4 (four) times daily.   pantoprazole (PROTONIX) 40 MG tablet Take 1 tablet (40 mg total) by mouth daily.   pramipexole (MIRAPEX) 0.5 MG tablet Take by mouth.   [DISCONTINUED] amLODipine (NORVASC) 5 MG tablet Take 1 tablet (5 mg total) by mouth daily. Take along with 2.5 mg for a total of 7.5 mg.   [DISCONTINUED] bisoprolol (ZEBETA) 5 MG tablet TAKE 1 TABLET BY MOUTH ONCE A DAY   [DISCONTINUED] Budeson-Glycopyrrol-Formoterol (BREZTRI AEROSPHERE) 160-9-4.8 MCG/ACT AERO Inhale 2 puffs into the lungs 2 (two) times daily.   [DISCONTINUED] buPROPion (WELLBUTRIN) 100 MG tablet TAKE 1 TABLET BY MOUTH ONCE A DAY   [DISCONTINUED] clopidogrel (PLAVIX) 75 MG tablet Take 1 tablet (75 mg total) by mouth daily.   [DISCONTINUED] LORazepam (ATIVAN) 0.5 MG tablet TAKE 1/2 TO 1 TABLET BY MOUTH EVERY 8 HOURS AS NEEDED FOR ANXIETY OR SLEEP   [DISCONTINUED] ondansetron (ZOFRAN) 4 MG tablet Take 1 tablet (4 mg total) by mouth every 8 (eight) hours as needed for nausea or vomiting.   [DISCONTINUED] VENTOLIN HFA 108 (90 Base) MCG/ACT inhaler INHALE 2 PUFFS INTO THE LUNGS EVERY 6 HOURS AS NEEDED FOR WHEEZING OR SHORTNESS OF BREATH.   amLODipine (NORVASC) 5 MG tablet Take 1 tablet (5 mg total) by mouth daily. Take along with 2.5 mg for a total of 7.5  mg.   bisoprolol (ZEBETA) 5 MG tablet Take 1 tablet (5 mg total) by mouth daily.   buPROPion (WELLBUTRIN) 100 MG tablet Take 1 tablet (100 mg total) by mouth daily.   clopidogrel (PLAVIX) 75 MG tablet Take 1 tablet (75 mg total) by mouth daily.   LORazepam (ATIVAN) 0.5 MG tablet TAKE 1/2 TO 1 TABLET BY MOUTH EVERY 8 HOURS AS NEEDED FOR ANXIETY OR SLEEP   ondansetron (ZOFRAN) 4 MG tablet Take 1 tablet (4 mg total) by mouth every 8 (eight) hours as needed for nausea or vomiting.   predniSONE (DELTASONE) 1 MG tablet Take by mouth.    VENTOLIN HFA 108 (90 Base) MCG/ACT inhaler INHALE 2 PUFFS INTO THE LUNGS EVERY 6 HOURS AS NEEDED FOR WHEEZING OR SHORTNESS OF BREATH.   No facility-administered encounter medications on file as of 01/19/2022.    Surgical History: Past Surgical History:  Procedure Laterality Date   ABDOMINAL HYSTERECTOMY     APPENDECTOMY     BREAST EXCISIONAL BIOPSY Left 80's or 90's   NEG   CARDIAC CATHETERIZATION     COLONOSCOPY N/A 05/22/2018   Procedure: COLONOSCOPY;  Surgeon: Lin Landsman, MD;  Location: [facility]COPY;  Service: Gastroenterology;  Laterality: N/A;   COLONOSCOPY WITH PROPOFOL N/A 07/19/2020   Procedure: COLONOSCOPY WITH PROPOFOL;  Surgeon: Toledo, Benay Pike, MD;  Location: [facility]OSCOPY;  Service: Gastroenterology;  Laterality: N/A;   ENDARTERECTOMY Right 11/06/2019   Procedure: ENDARTERECTOMY CAROTID;  Surgeon: Algernon Huxley, MD;  Location: [facility] ORS;  Service: Vascular;  Laterality: Right;   LEFT HEART CATH Bilateral 03/05/2017   Procedure: Left Heart Cath with poss PCI;  Surgeon: Wellington Hampshire, MD;  Location: [facility];  Service: Cardiovascular;  Laterality: Bilateral;    Medical History: Past Medical History:  Diagnosis Date   AAA (abdominal aortic aneurysm) ([place])    a. 05/2016 Abd U/S: 3.02 x 3.02 AAA.   Anxiety    CAD (coronary artery disease)    a. 02/2017 NSTEMI/Cath: LM nl, LAD mild dzs, D2 min irregs, LCX mild dzs, OM2 50ost, RCA 95p, 130m L-->R collats, EF 55-65%.   COPD (chronic obstructive pulmonary disease) ([facility])    Diastolic dysfunction    a. 02/2017 Echo: EF 55-60%, gr1 DD, ? inf HK, mild MR.   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 05/2016 ABI's: R 1.22, L 1.11.   Tobacco abuse     Family History: Family History  Problem Relation Age of Onset   Breast cancer Mother    Dementia Mother     Hypertension Mother    Breast cancer Maternal Aunt    Breast cancer Maternal Aunt    Breast cancer Maternal Aunt    CVA Father     Social History   Socioeconomic History   Marital status: Divorced    Spouse name: Not on file   Number of children: Not on file   Years of education: Not on file   Highest education level: Not on file  Occupational History   Not on file  Tobacco Use   Smoking status: Former    Packs/day: 0.25    Types: Cigarettes    Quit date: 07/12/2017    Years since quitting: 4.5   Smokeless tobacco: Never   Tobacco comments:  currently 90 days without smoking  Vaping Use   Vaping Use: Never used  Substance and Sexual Activity   Alcohol use: No   Drug use: No   Sexual activity: Not on file  Other Topics Concern   Not on file  Social History Narrative   Not on file   Social Determinants of Health   Financial Resource Strain: Not on file  Food Insecurity: Not on file  Transportation Needs: Not on file  Physical Activity: Not on file  Stress: Not on file  Social Connections: Not on file  Intimate Partner Violence: Not on file      Review of Systems  Constitutional:  Negative for activity change, appetite change, chills, fatigue, fever and unexpected weight change.  HENT: Negative.  Negative for congestion, ear pain, rhinorrhea, sore throat and trouble swallowing.   Eyes: Negative.   Respiratory:  Positive for cough, chest tightness, shortness of breath and wheezing (intermittent).   Cardiovascular: Negative.  Negative for chest pain and palpitations.  Gastrointestinal: Negative.  Negative for abdominal pain, blood in stool, constipation, diarrhea, nausea and vomiting.  Endocrine: Negative.   Genitourinary: Negative.  Negative for difficulty urinating, dysuria, frequency, hematuria and urgency.  Musculoskeletal:  Positive for arthralgias. Negative for back pain, joint swelling, myalgias and neck pain.  Skin: Negative.  Negative for rash and  wound.  Allergic/Immunologic: Negative.  Negative for immunocompromised state.  Neurological: Negative.  Negative for dizziness, seizures, weakness, numbness and headaches.  Hematological: Negative.   Psychiatric/Behavioral:  Positive for sleep disturbance. Negative for behavioral problems, self-injury and suicidal ideas. The patient is nervous/anxious.     Vital Signs: BP 140/70   Pulse 65   Temp 98.3 F (36.8 C)   Resp 16   Ht 5' (1.524 m)   Wt 72 lb 6.4 oz (32.8 kg)   SpO2 96%   BMI 14.14 kg/m    Physical Exam Vitals reviewed.  Constitutional:      General: She is awake. She is not in acute distress.    Appearance: Normal appearance. She is well-developed and well-groomed. She is obese. She is not ill-appearing or diaphoretic.  HENT:     Head: Normocephalic and atraumatic.     Right Ear: Tympanic membrane, ear canal and external ear normal. There is no impacted cerumen.     Left Ear: Tympanic membrane, ear canal and external ear normal. There is no impacted cerumen.     Nose: Nose normal. No congestion or rhinorrhea.     Mouth/Throat:     Lips: Pink.     Mouth: Mucous membranes are moist.     Pharynx: Oropharynx is clear. Uvula midline. No oropharyngeal exudate or posterior oropharyngeal erythema.  Eyes:     General: Lids are normal. Vision grossly intact. Gaze aligned appropriately. No scleral icterus.       Right eye: No discharge.        Left eye: No discharge.     Conjunctiva/sclera: Conjunctivae normal.     Pupils: Pupils are equal, round, and reactive to light.     Funduscopic exam:    Right eye: Red reflex present.        Left eye: Red reflex present. Neck:     Thyroid: No thyromegaly.     Vascular: No JVD.     Trachea: Trachea and phonation normal. No tracheal deviation.  Cardiovascular:     Rate and Rhythm: Normal rate and regular rhythm.     Pulses: Normal pulses.  Heart sounds: Normal heart sounds, S1 normal and S2 normal. No murmur heard.    No  friction rub. No gallop.  Pulmonary:     Effort: Pulmonary effort is normal. No accessory muscle usage or respiratory distress.     Breath sounds: Normal air entry. No stridor. Examination of the right-lower field reveals decreased breath sounds. Examination of the left-lower field reveals decreased breath sounds. Decreased breath sounds present. No wheezing or rales.  Chest:     Chest wall: No tenderness.     Comments: Declined breast exam.  Abdominal:     General: Abdomen is flat. Bowel sounds are normal. There is no distension.     Palpations: Abdomen is soft. There is no shifting dullness, fluid wave, mass or pulsatile mass.     Tenderness: There is no abdominal tenderness. There is no guarding or rebound.  Musculoskeletal:        General: No tenderness or deformity. Normal range of motion.     Cervical back: Normal range of motion and neck supple.     Right lower leg: No edema.     Left lower leg: No edema.  Lymphadenopathy:     Cervical: No cervical adenopathy.  Skin:    General: Skin is warm and dry.     Capillary Refill: Capillary refill takes less than 2 seconds.     Coloration: Skin is not pale.     Findings: No erythema or rash.  Neurological:     Mental Status: She is alert and oriented to person, place, and time.     Cranial Nerves: No cranial nerve deficit.     Motor: No abnormal muscle tone.     Coordination: Coordination normal.     Gait: Gait normal.     Deep Tendon Reflexes: Reflexes are normal and symmetric.  Psychiatric:        Mood and Affect: Mood normal.        Behavior: Behavior normal. Behavior is cooperative.        Thought Content: Thought content normal.        Judgment: Judgment normal.       Assessment/Plan: 1. Encounter for general adult medical examination with abnormal findings Age-appropriate preventive screenings and vaccinations discussed, annual physical exam completed. Routine labs for health maintenance ordered, see below, lipid panel  was the only lab needed. PHM updated. All refills ordered that were due - amLODipine (NORVASC) 5 MG tablet; Take 1 tablet (5 mg total) by mouth daily. Take along with 2.5 mg for a total of 7.5 mg.  Dispense: 90 tablet; Refill: 3 - clopidogrel (PLAVIX) 75 MG tablet; Take 1 tablet (75 mg total) by mouth daily.  Dispense: 90 tablet; Refill: 3 - buPROPion (WELLBUTRIN) 100 MG tablet; Take 1 tablet (100 mg total) by mouth daily.  Dispense: 90 tablet; Refill: 3  2. Essential hypertension Stable and controlled with current medications, refills ordered. - amLODipine (NORVASC) 5 MG tablet; Take 1 tablet (5 mg total) by mouth daily. Take along with 2.5 mg for a total of 7.5 mg.  Dispense: 90 tablet; Refill: 3 - bisoprolol (ZEBETA) 5 MG tablet; Take 1 tablet (5 mg total) by mouth daily.  Dispense: 90 tablet; Refill: 3  3. Advanced COPD ([place]) Sees a specialist at [place] - predniSONE (DELTASONE) 1 MG tablet; Take by mouth. - VENTOLIN HFA 108 (90 Base) MCG/ACT inhaler; INHALE 2 PUFFS INTO THE LUNGS EVERY 6 HOURS AS NEEDED FOR WHEEZING OR SHORTNESS OF BREATH.  Dispense: 18 g; Refill:  3  4. Coronary artery disease due to lipid rich plaque Check lipid panel, continue plavix as prescribed - Lipid Profile - clopidogrel (PLAVIX) 75 MG tablet; Take 1 tablet (75 mg total) by mouth daily.  Dispense: 90 tablet; Refill: 3  5. Bilateral carotid artery stenosis BP controlled, and continue medication as prescribed, refills ordered - amLODipine (NORVASC) 5 MG tablet; Take 1 tablet (5 mg total) by mouth daily. Take along with 2.5 mg for a total of 7.5 mg.  Dispense: 90 tablet; Refill: 3 - clopidogrel (PLAVIX) 75 MG tablet; Take 1 tablet (75 mg total) by mouth daily.  Dispense: 90 tablet; Refill: 3  6. Dysuria Urinalysis done - UA/M w/rflx Culture, Routine  7. Generalized anxiety disorder Stable, refills ordered - LORazepam (ATIVAN) 0.5 MG tablet; TAKE 1/2 TO 1 TABLET BY MOUTH EVERY 8 HOURS AS NEEDED FOR ANXIETY OR  SLEEP  Dispense: 60 tablet; Refill: 2 - buPROPion (WELLBUTRIN) 100 MG tablet; Take 1 tablet (100 mg total) by mouth daily.  Dispense: 90 tablet; Refill: 3  8. Primary insomnia Takes lorazepam at bed time to help her sleep sometimes. This does help.       General Counseling: Christina Hester verbalizes understanding of the findings of todays visit and agrees with plan of treatment. I have discussed any further diagnostic evaluation that may be needed or ordered today. We also reviewed her medications today. she has been encouraged to call the office with any questions or concerns that should arise related to todays visit.    Orders Placed This Encounter  Procedures   UA/M w/rflx Culture, Routine   Lipid Profile    Meds ordered this encounter  Medications   amLODipine (NORVASC) 5 MG tablet    Sig: Take 1 tablet (5 mg total) by mouth daily. Take along with 2.5 mg for a total of 7.5 mg.    Dispense:  90 tablet    Refill:  3   bisoprolol (ZEBETA) 5 MG tablet    Sig: Take 1 tablet (5 mg total) by mouth daily.    Dispense:  90 tablet    Refill:  3    For future refills   clopidogrel (PLAVIX) 75 MG tablet    Sig: Take 1 tablet (75 mg total) by mouth daily.    Dispense:  90 tablet    Refill:  3    Patient is due for a follow up appointment. Please contact the office at [phone]   LORazepam (ATIVAN) 0.5 MG tablet    Sig: TAKE 1/2 TO 1 TABLET BY MOUTH EVERY 8 HOURS AS NEEDED FOR ANXIETY OR SLEEP    Dispense:  60 tablet    Refill:  2   ondansetron (ZOFRAN) 4 MG tablet    Sig: Take 1 tablet (4 mg total) by mouth every 8 (eight) hours as needed for nausea or vomiting.    Dispense:  20 tablet    Refill:  0   VENTOLIN HFA 108 (90 Base) MCG/ACT inhaler    Sig: INHALE 2 PUFFS INTO THE LUNGS EVERY 6 HOURS AS NEEDED FOR WHEEZING OR SHORTNESS OF BREATH.    Dispense:  18 g    Refill:  3   buPROPion (WELLBUTRIN) 100 MG tablet    Sig: Take 1 tablet (100 mg total) by mouth daily.    Dispense:  90  tablet    Refill:  3    For future refills    Return for F/U, med refill, Mercer Peifer PCP ativan.   Total time  spent:30 Minutes Time spent includes review of chart, medications, test results, and follow up plan with the patient.   [place] Controlled Substance Database was reviewed by me.  This patient was seen by Jonetta Osgood, FNP-C in collaboration with Dr. Clayborn Bigness as a part of collaborative care agreement.  Shakera Ebrahimi R. Valetta Fuller, MSN, FNP-C Internal medicine

## 2022-01-20 ENCOUNTER — Encounter: Payer: Self-pay | Admitting: Nurse Practitioner

## 2022-01-20 LAB — UA/M W/RFLX CULTURE, ROUTINE
Bilirubin, UA: NEGATIVE
Glucose, UA: NEGATIVE
Ketones, UA: NEGATIVE
Leukocytes,UA: NEGATIVE
Nitrite, UA: NEGATIVE
RBC, UA: NEGATIVE
Specific Gravity, UA: 1.011 (ref 1.005–1.030)
Urobilinogen, Ur: 0.2 mg/dL (ref 0.2–1.0)
pH, UA: 7.5 (ref 5.0–7.5)

## 2022-01-20 LAB — MICROSCOPIC EXAMINATION
Bacteria, UA: NONE SEEN
Casts: NONE SEEN /lpf
Epithelial Cells (non renal): NONE SEEN /hpf (ref 0–10)
WBC, UA: NONE SEEN /hpf (ref 0–5)

## 2022-03-22 DIAGNOSIS — Z23 Encounter for immunization: Secondary | ICD-10-CM | POA: Diagnosis not present

## 2022-04-04 DIAGNOSIS — I2583 Coronary atherosclerosis due to lipid rich plaque: Secondary | ICD-10-CM | POA: Diagnosis not present

## 2022-04-04 DIAGNOSIS — I2510 Atherosclerotic heart disease of native coronary artery without angina pectoris: Secondary | ICD-10-CM | POA: Diagnosis not present

## 2022-04-05 LAB — LIPID PANEL
Chol/HDL Ratio: 2.5 ratio (ref 0.0–4.4)
Cholesterol, Total: 265 mg/dL — ABNORMAL HIGH (ref 100–199)
HDL: 106 mg/dL (ref >39)
LDL Chol Calc (NIH): 145 mg/dL — ABNORMAL HIGH (ref 0–99)
Triglycerides: 87 mg/dL (ref 0–149)
VLDL Cholesterol Cal: 14 mg/dL (ref 5–40)

## 2022-04-12 DIAGNOSIS — J449 Chronic obstructive pulmonary disease, unspecified: Secondary | ICD-10-CM | POA: Diagnosis not present

## 2022-04-17 ENCOUNTER — Other Ambulatory Visit: Payer: Self-pay | Admitting: Internal Medicine

## 2022-04-17 DIAGNOSIS — Z5309 Procedure and treatment not carried out because of other contraindication: Secondary | ICD-10-CM

## 2022-04-20 ENCOUNTER — Encounter: Payer: Self-pay | Admitting: Nurse Practitioner

## 2022-04-20 ENCOUNTER — Ambulatory Visit (INDEPENDENT_AMBULATORY_CARE_PROVIDER_SITE_OTHER): Payer: Medicare Other | Admitting: Nurse Practitioner

## 2022-04-20 VITALS — BP 130/75 | HR 71 | Temp 97.6°F | Resp 16 | Ht 60.0 in | Wt 74.4 lb

## 2022-04-20 DIAGNOSIS — M2550 Pain in unspecified joint: Secondary | ICD-10-CM | POA: Diagnosis not present

## 2022-04-20 DIAGNOSIS — Z23 Encounter for immunization: Secondary | ICD-10-CM

## 2022-04-20 DIAGNOSIS — F411 Generalized anxiety disorder: Secondary | ICD-10-CM

## 2022-04-20 MED ORDER — MELOXICAM 15 MG PO TABS: 15.0000 mg | ORAL_TABLET | Freq: Every day | ORAL | 2 refills | Status: DC

## 2022-04-20 MED ORDER — LORAZEPAM 0.5 MG PO TABS: ORAL_TABLET | ORAL | 2 refills | Status: DC

## 2022-04-20 MED ORDER — AREXVY 120 MCG/0.5ML IM SUSR: 0.5000 mL | Freq: Once | INTRAMUSCULAR | 0 refills | Status: AC

## 2022-04-20 NOTE — Progress Notes (Signed)
[facility] [place], [place] [address]  Internal MEDICINE  Office Visit Note  Patient Name: Christina Hester  [id number]  [id number]  Date of Service: 04/20/2022  Chief Complaint  Patient presents with   Follow-up    Follow up med refill   Hyperlipidemia    HPI Christina Hester presents for a follow up visit for hyperlipidemia, labs, and refills. Hyperlipidemia -- cholesterol levels are improving on most recent lab results.  COPD -- sees pulmonology who says her lungs sound better than they have in a while. Taking prednisone 2 mg daily. Anxiety -- taking lorazepam prn, needs refills Needs flu shot Considering RSV vaccine    Current Medication: Outpatient Encounter Medications as of 04/20/2022  Medication Sig   amLODipine (NORVASC) 5 MG tablet Take 1 tablet (5 mg total) by mouth daily. Take along with 2.5 mg for a total of 7.5 mg.   azithromycin (ZITHROMAX) 250 MG tablet Take 1 tablet by mouth every Monday, Wednesday, and Friday.   benzonatate (TESSALON) 200 MG capsule Take by mouth as needed.   bisoprolol (ZEBETA) 5 MG tablet Take 1 tablet (5 mg total) by mouth daily.   buPROPion (WELLBUTRIN) 100 MG tablet Take 1 tablet (100 mg total) by mouth daily.   clopidogrel (PLAVIX) 75 MG tablet Take 1 tablet (75 mg total) by mouth daily.   clotrimazole (MYCELEX) 10 MG troche Take 1 tablet (10 mg total) by mouth 5 (five) times daily.   clotrimazole (MYCELEX) 10 MG troche Take 10 mg by mouth as needed.   ipratropium-albuterol (DUONEB) 0.5-2.5 (3) MG/3ML SOLN USE 1 VIAL IN NEBULIZER EVERY 4 HOURS   nitroGLYCERIN (NITROSTAT) 0.4 MG SL tablet Place 1 tablet (0.4 mg total) under the tongue every 5 (five) minutes as needed for chest pain.   nystatin (MYCOSTATIN) 100000 UNIT/ML suspension Take 5 mLs (500,000 Units total) by mouth 4 (four) times daily.   ondansetron (ZOFRAN) 4 MG tablet Take 1 tablet (4 mg total) by mouth every 8 (eight) hours as needed for nausea or vomiting.    pantoprazole (PROTONIX) 40 MG tablet Take 1 tablet (40 mg total) by mouth daily.   pramipexole (MIRAPEX) 0.5 MG tablet Take by mouth.   predniSONE (DELTASONE) 1 MG tablet Take by mouth.   RSV vaccine recomb adjuvanted (AREXVY) 120 MCG/0.5ML injection Inject 0.5 mLs into the muscle once for 1 dose.   VENTOLIN HFA 108 (90 Base) MCG/ACT inhaler INHALE 2 PUFFS INTO THE LUNGS EVERY 6 HOURS AS NEEDED FOR WHEEZING OR SHORTNESS OF BREATH.   [DISCONTINUED] LORazepam (ATIVAN) 0.5 MG tablet TAKE 1/2 TO 1 TABLET BY MOUTH EVERY 8 HOURS AS NEEDED FOR ANXIETY OR SLEEP   [DISCONTINUED] meloxicam (MOBIC) 15 MG tablet Take 1 tablet (15 mg total) by mouth daily.   LORazepam (ATIVAN) 0.5 MG tablet TAKE 1/2 TO 1 TABLET BY MOUTH EVERY 8 HOURS AS NEEDED FOR ANXIETY OR SLEEP   meloxicam (MOBIC) 15 MG tablet Take 1 tablet (15 mg total) by mouth daily.   No facility-administered encounter medications on file as of 04/20/2022.    Surgical History: Past Surgical History:  Procedure Laterality Date   ABDOMINAL HYSTERECTOMY     APPENDECTOMY     BREAST EXCISIONAL BIOPSY Left 80's or 90's   NEG   CARDIAC CATHETERIZATION     COLONOSCOPY N/A 05/22/2018   Procedure: COLONOSCOPY;  Surgeon: Lin Landsman, MD;  Location: [facility] ENDOSCOPY;  Service: Gastroenterology;  Laterality: N/A;   COLONOSCOPY WITH PROPOFOL N/A 07/19/2020  Procedure: COLONOSCOPY WITH PROPOFOL;  Surgeon: Toledo, Benay Pike, MD;  Location: [facility] ENDOSCOPY;  Service: Gastroenterology;  Laterality: N/A;   ENDARTERECTOMY Right 11/06/2019   Procedure: ENDARTERECTOMY CAROTID;  Surgeon: Algernon Huxley, MD;  Location: [facility] ORS;  Service: Vascular;  Laterality: Right;   LEFT HEART CATH Bilateral 03/05/2017   Procedure: Left Heart Cath with poss PCI;  Surgeon: Wellington Hampshire, MD;  Location: [place] [facility];  Service: Cardiovascular;  Laterality: Bilateral;    Medical History: Past Medical History:  Diagnosis Date   AAA (abdominal aortic aneurysm) ([place])     a. 05/2016 Abd U/S: 3.02 x 3.02 AAA.   Anxiety    CAD (coronary artery disease)    a. 02/2017 NSTEMI/Cath: LM nl, LAD mild dzs, D2 min irregs, LCX mild dzs, OM2 50ost, RCA 95p, 131m L-->R collats, EF 55-65%.   COPD (chronic obstructive pulmonary disease) (H[facility])    Diastolic dysfunction    a. 02/2017 Echo: EF 55-60%, gr1 DD, ? inf HK, mild MR.   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 05/2016 ABI's: R 1.22, L 1.11.   Tobacco abuse     Family History: Family History  Problem Relation Age of Onset   Breast cancer Mother    Dementia Mother    Hypertension Mother    Breast cancer Maternal Aunt    Breast cancer Maternal Aunt    Breast cancer Maternal Aunt    CVA Father     Social History   Socioeconomic History   Marital status: Divorced    Spouse name: Not on file   Number of children: Not on file   Years of education: Not on file   Highest education level: Not on file  Occupational History   Not on file  Tobacco Use   Smoking status: Former    Packs/day: 0.25    Types: Cigarettes    Quit date: 07/12/2017    Years since quitting: 4.7   Smokeless tobacco: Never   Tobacco comments:    currently 90 days without smoking  Vaping Use   Vaping Use: Never used  Substance and Sexual Activity   Alcohol use: No   Drug use: No   Sexual activity: Not on file  Other Topics Concern   Not on file  Social History Narrative   Not on file   Social Determinants of Health   Financial Resource Strain: Not on file  Food Insecurity: Not on file  Transportation Needs: Not on file  Physical Activity: Not on file  Stress: Not on file  Social Connections: Not on file  Intimate Partner Violence: Not on file      Review of Systems  Constitutional:  Positive for fatigue. Negative for chills and unexpected weight change.  HENT:  Negative  for congestion, rhinorrhea, sneezing and sore throat.   Eyes:  Negative for redness.  Respiratory:  Positive for shortness of breath (intermittent). Negative for cough, chest tightness and wheezing.   Cardiovascular:  Positive for palpitations. Negative for chest pain.  Gastrointestinal:  Negative for abdominal pain, constipation, diarrhea, nausea and vomiting.  Genitourinary:  Negative for dysuria and frequency.  Musculoskeletal:  Negative for arthralgias, back pain, joint swelling and neck pain.  Skin:  Negative for rash.  Neurological:  Positive for tremors. Negative for  numbness.       Feeling jittery  Hematological:  Negative for adenopathy. Does not bruise/bleed easily.  Psychiatric/Behavioral:  Negative for behavioral problems (Depression), sleep disturbance and suicidal ideas. The patient is not nervous/anxious.     Vital Signs: BP 130/75   Pulse 71   Temp 97.6 F (36.4 C)   Resp 16   Ht 5' (1.524 m)   Wt 74 lb 6.4 oz (33.7 kg)   SpO2 96%   BMI 14.53 kg/m    Physical Exam Vitals reviewed.  Constitutional:      General: She is not in acute distress.    Appearance: Normal appearance. She is underweight. She is not ill-appearing.  HENT:     Head: Normocephalic and atraumatic.  Eyes:     Pupils: Pupils are equal, round, and reactive to light.  Cardiovascular:     Rate and Rhythm: Normal rate and regular rhythm.  Pulmonary:     Effort: Pulmonary effort is normal. No respiratory distress.     Breath sounds: Normal breath sounds.  Neurological:     Mental Status: She is alert and oriented to person, place, and time.     Cranial Nerves: No cranial nerve deficit.     Coordination: Coordination normal.     Gait: Gait normal.  Psychiatric:        Mood and Affect: Mood normal.        Behavior: Behavior normal.        Assessment/Plan: 1. Arthralgia, unspecified joint Refills, arthritis is tolerable with mobic - meloxicam (MOBIC) 15 MG tablet; Take 1 tablet (15 mg  total) by mouth daily.  Dispense: 30 tablet; Refill: 2  2. Generalized anxiety disorder Stable with prn ativan - LORazepam (ATIVAN) 0.5 MG tablet; TAKE 1/2 TO 1 TABLET BY MOUTH EVERY 8 HOURS AS NEEDED FOR ANXIETY OR SLEEP  Dispense: 60 tablet; Refill: 2  3. Need for vaccination - RSV vaccine recomb adjuvanted (AREXVY) 120 MCG/0.5ML injection; Inject 0.5 mLs into the muscle once for 1 dose.  Dispense: 0.5 mL; Refill: 0  General Counseling: Storey verbalizes understanding of the findings of todays visit and agrees with plan of treatment. I have discussed any further diagnostic evaluation that may be needed or ordered today. We also reviewed her medications today. she has been encouraged to call the office with any questions or concerns that should arise related to todays visit.    No orders of the defined types were placed in this encounter.   Meds ordered this encounter  Medications   LORazepam (ATIVAN) 0.5 MG tablet    Sig: TAKE 1/2 TO 1 TABLET BY MOUTH EVERY 8 HOURS AS NEEDED FOR ANXIETY OR SLEEP    Dispense:  60 tablet    Refill:  2   RSV vaccine recomb adjuvanted (AREXVY) 120 MCG/0.5ML injection    Sig: Inject 0.5 mLs into the muscle once for 1 dose.    Dispense:  0.5 mL    Refill:  0   meloxicam (MOBIC) 15 MG tablet    Sig: Take 1 tablet (15 mg total) by mouth daily.    Dispense:  30 tablet    Refill:  2    Return in about 3 months (around 07/21/2022) for F/U, anxiety med refill, Gust Eugene PCP.   Total time spent:30 Minutes Time spent includes review of chart, medications, test results, and follow up plan with the patient.   [place] Controlled Substance Database was reviewed by me.  This patient was seen by Jonetta Osgood,  FNP-C in collaboration with Dr. Clayborn Bigness as a part of collaborative care agreement.   Rishav Rockefeller R. Valetta Fuller, MSN, FNP-C Internal medicine

## 2022-05-15 ENCOUNTER — Encounter (INDEPENDENT_AMBULATORY_CARE_PROVIDER_SITE_OTHER): Payer: Self-pay

## 2022-06-09 ENCOUNTER — Other Ambulatory Visit: Payer: Self-pay

## 2022-06-09 ENCOUNTER — Telehealth: Payer: Self-pay | Admitting: Physician Assistant

## 2022-06-09 ENCOUNTER — Encounter: Payer: Self-pay | Admitting: Emergency Medicine

## 2022-06-09 DIAGNOSIS — M545 Low back pain, unspecified: Secondary | ICD-10-CM | POA: Diagnosis present

## 2022-06-09 DIAGNOSIS — M4850XA Collapsed vertebra, not elsewhere classified, site unspecified, initial encounter for fracture: Secondary | ICD-10-CM | POA: Insufficient documentation

## 2022-06-09 DIAGNOSIS — E871 Hypo-osmolality and hyponatremia: Secondary | ICD-10-CM | POA: Insufficient documentation

## 2022-06-09 DIAGNOSIS — S32020A Wedge compression fracture of second lumbar vertebra, initial encounter for closed fracture: Secondary | ICD-10-CM | POA: Diagnosis not present

## 2022-06-09 DIAGNOSIS — S32010A Wedge compression fracture of first lumbar vertebra, initial encounter for closed fracture: Secondary | ICD-10-CM | POA: Diagnosis not present

## 2022-06-09 DIAGNOSIS — K5730 Diverticulosis of large intestine without perforation or abscess without bleeding: Secondary | ICD-10-CM | POA: Diagnosis not present

## 2022-06-09 DIAGNOSIS — R109 Unspecified abdominal pain: Secondary | ICD-10-CM | POA: Diagnosis not present

## 2022-06-09 DIAGNOSIS — N281 Cyst of kidney, acquired: Secondary | ICD-10-CM | POA: Diagnosis not present

## 2022-06-09 DIAGNOSIS — I10 Essential (primary) hypertension: Secondary | ICD-10-CM | POA: Diagnosis not present

## 2022-06-09 LAB — CBC
HCT: 35.8 % — ABNORMAL LOW (ref 36.0–46.0)
Hemoglobin: 12.6 g/dL (ref 12.0–15.0)
MCH: 30.7 pg (ref 26.0–34.0)
MCHC: 35.2 g/dL (ref 30.0–36.0)
MCV: 87.1 fL (ref 80.0–100.0)
Platelets: 255 10*3/uL (ref 150–400)
RBC: 4.11 MIL/uL (ref 3.87–5.11)
RDW: 12.3 % (ref 11.5–15.5)
WBC: 7.8 10*3/uL (ref 4.0–10.5)
nRBC: 0 % (ref 0.0–0.2)

## 2022-06-09 LAB — COMPREHENSIVE METABOLIC PANEL
ALT: 23 U/L (ref 0–44)
AST: 31 U/L (ref 15–41)
Albumin: 4.3 g/dL (ref 3.5–5.0)
Alkaline Phosphatase: 79 U/L (ref 38–126)
Anion gap: 12 (ref 5–15)
BUN: 13 mg/dL (ref 8–23)
CO2: 28 mmol/L (ref 22–32)
Calcium: 9.8 mg/dL (ref 8.9–10.3)
Chloride: 86 mmol/L — ABNORMAL LOW (ref 98–111)
Creatinine, Ser: 0.69 mg/dL (ref 0.44–1.00)
GFR, Estimated: >60 mL/min (ref >60)
Glucose, Bld: 92 mg/dL (ref 70–99)
Potassium: 3.8 mmol/L (ref 3.5–5.1)
Sodium: 126 mmol/L — ABNORMAL LOW (ref 135–145)
Total Bilirubin: 0.8 mg/dL (ref 0.3–1.2)
Total Protein: 7.6 g/dL (ref 6.5–8.1)

## 2022-06-09 LAB — TYPE AND SCREEN
ABO/RH(D): A POS
Antibody Screen: NEGATIVE

## 2022-06-09 LAB — PROTIME-INR
INR: 1 (ref 0.8–1.2)
Prothrombin Time: 13.1 seconds (ref 11.4–15.2)

## 2022-06-09 NOTE — ED Triage Notes (Signed)
Pt to ED via POV, pt states last week was at [place] and had sudden onset abd pain. Pt states resolved on it's own. Pt states then started having lower back pain that radiates into lower abd and has not resolved. Pt states today began having watery stools that are noted to be black in nature. Pt reports has not eaten anything today.

## 2022-06-09 NOTE — Telephone Encounter (Signed)
Patient's daughter, Pinkie lvm on after-hour line stating patient has had back pain and constipation. She has taken miralax, fleet enema, suppository and eaten prunes. Per Ander Purpura, I instructed Chakara to take patient to ED for possible bowel obstruction since otc medications have not resolved problem-Toni

## 2022-06-09 NOTE — ED Triage Notes (Addendum)
First Nurse Note: Pt reports lower back pain since Sunday and abd pain since Wed. Pt reports today black "runny" stools, 1 occurrence. Denies n/v.

## 2022-06-10 ENCOUNTER — Emergency Department: Payer: Medicare Other

## 2022-06-10 ENCOUNTER — Emergency Department
Admission: EM | Admit: 2022-06-10 | Discharge: 2022-06-10 | Disposition: A | Discharge status: Home / Self Care | Payer: Medicare Other | Attending: Emergency Medicine | Admitting: Emergency Medicine

## 2022-06-10 DIAGNOSIS — M4850XA Collapsed vertebra, not elsewhere classified, site unspecified, initial encounter for fracture: Secondary | ICD-10-CM | POA: Diagnosis not present

## 2022-06-10 DIAGNOSIS — N281 Cyst of kidney, acquired: Secondary | ICD-10-CM | POA: Diagnosis not present

## 2022-06-10 DIAGNOSIS — K5730 Diverticulosis of large intestine without perforation or abscess without bleeding: Secondary | ICD-10-CM | POA: Diagnosis not present

## 2022-06-10 DIAGNOSIS — M4856XA Collapsed vertebra, not elsewhere classified, lumbar region, initial encounter for fracture: Secondary | ICD-10-CM

## 2022-06-10 DIAGNOSIS — S32020A Wedge compression fracture of second lumbar vertebra, initial encounter for closed fracture: Secondary | ICD-10-CM

## 2022-06-10 LAB — URINALYSIS, ROUTINE W REFLEX MICROSCOPIC
Bilirubin Urine: NEGATIVE
Glucose, UA: NEGATIVE mg/dL
Ketones, ur: 5 mg/dL — AB
Leukocytes,Ua: NEGATIVE
Nitrite: NEGATIVE
Protein, ur: NEGATIVE mg/dL
Specific Gravity, Urine: 1.004 — ABNORMAL LOW (ref 1.005–1.030)
pH: 7 (ref 5.0–8.0)

## 2022-06-10 MED ORDER — KETOROLAC TROMETHAMINE 30 MG/ML IJ SOLN
15.0000 mg | Freq: Once | INTRAMUSCULAR | Status: AC
  Administered 2022-06-10: 15 mg via INTRAVENOUS
  Filled 2022-06-10: qty 1

## 2022-06-10 MED ORDER — HYDROCODONE-ACETAMINOPHEN 5-325 MG PO TABS: 1.0000 | ORAL_TABLET | Freq: Once | ORAL | Status: DC

## 2022-06-10 MED ORDER — SODIUM CHLORIDE 0.9 % IV BOLUS
1000.0000 mL | Freq: Once | INTRAVENOUS | Status: AC
  Administered 2022-06-10: 1000 mL via INTRAVENOUS

## 2022-06-10 MED ORDER — HYDROCODONE-ACETAMINOPHEN 5-325 MG PO TABS: 1.0000 | ORAL_TABLET | ORAL | 0 refills | Status: DC | PRN

## 2022-06-10 MED ORDER — IOHEXOL 350 MG/ML SOLN
50.0000 mL | Freq: Once | INTRAVENOUS | Status: AC | PRN
  Administered 2022-06-10: 50 mL via INTRAVENOUS

## 2022-06-10 NOTE — Discharge Instructions (Addendum)
As we discussed, the pain in your back is coming from what is called compression fractures, which can sometimes happen over time without any specific trauma.  Please take over-the-counter pain medication as needed. Take Norco as prescribed for severe pain. Do not drink alcohol, drive or participate in any other potentially dangerous activities while taking this medication as it may make you sleepy. Do not take this medication with any other sedating medications, either prescription or over-the-counter. If you were prescribed Percocet or Vicodin, do not take these with acetaminophen (Tylenol) as it is already contained within these medications.   This medication is an opiate (or narcotic) pain medication and can be habit forming.  Use it as little as possible to achieve adequate pain control.  Do not use or use it with extreme caution if you have a history of opiate abuse or dependence.  If you are on a pain contract with your primary care doctor or a pain specialist, be sure to let them know you were prescribed this medication today from the [facility] Emergency Department.  This medication is intended for your use only - do not give any to anyone else and keep it in a secure place where nobody else, especially children, have access to it.  It will also cause or worsen constipation, so you may want to consider taking an over-the-counter stool softener while you are taking this medication.   A representative from the office of Dr. Izora Ribas will most likely call to schedule follow-up appointment, but if you have not heard from them by Tuesday, please call the number provided and explained that Dr. Izora Ribas would like to see you in clinic to follow-up on an emergency department visit.    Return to the emergency department if you develop new or worsening symptoms that concern you.

## 2022-06-10 NOTE — ED Provider Notes (Signed)
[facility] Provider Note    Event Date/Time   First MD Initiated Contact with Patient 06/10/22 0209     (approximate)   History   Back Pain and Abdominal Pain   HPI  Christina Hester is a 81 y.o. female who presents for evaluation of pain across her lower back.  She says she has been dealing with some constipation recently as well.  She has had a little bit of dysuria.  The symptoms in her lower back have been going on for about a week and have gradually been getting worse.  She had an episode of abdominal pain more than a week ago that resolved on its own but the back pain has persisted.  Nothing in particular seems to make it better or worse.  Because she was not having bowel movements, she took a bottle of magnesium citrate and then started having watery bowel movements that appeared dark at least initially.  She has not wanted to eat anything today but she is not nauseated and has not been having any vomiting.  She does not take any blood thinners.  She is no longer having any abdominal pain and notes that the dysuria just started recently.  She occasionally has to take MiraLAX and does suffer from constipation occasionally.     Physical Exam   Triage Vital Signs: ED Triage Vitals  Enc Vitals Group     BP 06/09/22 2034 (!) 142/75     Pulse Rate 06/09/22 2034 80     Resp 06/09/22 2034 (!) 22     Temp 06/09/22 2034 98.5 F (36.9 C)     Temp Source 06/09/22 2034 Oral     SpO2 06/09/22 2034 95 %     Weight 06/09/22 2034 34 kg (75 lb)     Height 06/09/22 2034 1.524 m (5')     Head Circumference --      Peak Flow --      Pain Score 06/09/22 2033 9     Pain Loc --      Pain Edu? --      Excl. in [place]? --     Most recent vital signs: Vitals:   06/10/22 0315 06/10/22 0532  BP: (!) 153/68 135/70  Pulse: 73 79  Resp:  18  Temp: 98.1 F (36.7 C) 98 F (36.7 C)  SpO2: 94% 93%     General: Awake, no distress.  Elderly and small of stature but  alert and oriented, sharp, appropriately interactive and ambulatory without any difficulty or without requiring assistance. CV:  Good peripheral perfusion.  Normal heart sounds, regular rate and rhythm. Resp:  Normal effort.  Lungs are clear to auscultation. Abd:  No distention.  Currently no tenderness to palpation of her lower abdomen. Other:  Some mild tenderness palpation bilateral lumbar paraspinal muscles but no point tenderness palpation of the spine itself.   ED Results / Procedures / Treatments   Labs (all labs ordered are listed, but only abnormal results are displayed) Labs Reviewed  COMPREHENSIVE METABOLIC PANEL - Abnormal; Notable for the following components:      Result Value   Sodium 126 (*)    Chloride 86 (*)    All other components within normal limits  CBC - Abnormal; Notable for the following components:   HCT 35.8 (*)    All other components within normal limits  URINALYSIS, ROUTINE W REFLEX MICROSCOPIC - Abnormal; Notable for the following components:   Color, Urine STRAW (*)  APPearance CLEAR (*)    Specific Gravity, Urine 1.004 (*)    Hgb urine dipstick SMALL (*)    Ketones, ur 5 (*)    Bacteria, UA RARE (*)    All other components within normal limits  PROTIME-INR  TYPE AND SCREEN     EKG  ED ECG REPORT I, Hinda Kehr, the attending physician, personally viewed and interpreted this ECG.  Date: 06/09/2022 EKG Time: 20: 41 Rate: 76 Rhythm: normal sinus rhythm QRS Axis: Right axis deviation Intervals: normal ST/T Wave abnormalities: Non-specific ST segment / T-wave changes, but no clear evidence of acute ischemia. Narrative Interpretation: no definitive evidence of acute ischemia; does not meet STEMI criteria.    RADIOLOGY I viewed and interpreted the patient's CT of the abdomen pelvis with IV contrast.  See hospital course for details, but in short, the patient has "age-indeterminate" L1 and L2 compression  fractures    PROCEDURES:  Critical Care performed: No  Procedures   MEDICATIONS ORDERED IN ED: Medications  HYDROcodone-acetaminophen (NORCO/VICODIN) 5-325 MG per tablet 1 tablet (has no administration in time range)  sodium chloride 0.9 % bolus 1,000 mL (0 mLs Intravenous Stopped 06/10/22 0430)  iohexol (OMNIPAQUE) 350 MG/ML injection 50 mL (50 mLs Intravenous Contrast Given 06/10/22 0344)  ketorolac (TORADOL) 30 MG/ML injection 15 mg (15 mg Intravenous Given 06/10/22 0418)     IMPRESSION / MDM / ASSESSMENT AND PLAN / ED COURSE  I reviewed the triage vital signs and the nursing notes.                              Differential diagnosis includes, but is not limited to, musculoskeletal strain, arthritis, UTI/pyelonephritis, renal colic, constipation, biliary colic, appendicitis.  Patient's presentation is most consistent with acute presentation with potential threat to life or bodily function.  Vital signs are stable and within normal limits.  Patient is quite well-appearing in spite of her age and comorbidities.  Labs/studies ordered: EKG, CMP, CBC, pro time-INR, type and screen, urinalysis.  Comprehensive metabolic panel is notable for hyponatremia at 126 but the patient seems to be asymptomatic from this.  I suspect this is volume related because she admits she has not been drinking much fluid lately.  Her CBC is normal including a hemoglobin of 12.6 which is reassuring.  There is very little role for a Hemoccult at this time because even if it was slightly positive she is not having copious GI bleeding and can follow-up as an outpatient.  Additionally, I talked about this with the patient, and she would very much prefer not to have a digital rectal exam at this time.  Urinalysis is pending and UTI certainly possible.  However her physical exam would suggest musculoskeletal strain of the lower back.  However, given her age and constellation of symptoms, I ordered a CT of the  abdomen pelvis with IV contrast to further evaluate and rule out any acute intra-abdominal issues that could explain her abdominal pain and back pain.  The patient and her daughter agree with the plan.  I ordered normal saline 1 L IV bolus for her apparent volume depletion and what appears to be acute hyponatremia.  EKG reassuring with no evidence of ischemia.   Clinical Course as of 06/10/22 0553  Sat Jun 10, 2022  1700 Urinalysis, Routine w reflex microscopic Urine, Clean Catch(!) I reviewed the urinalysis results and there is no indication that she has a UTI at this  time [CF]    Clinical Course User Index [CF] Hinda Kehr, MD     FINAL CLINICAL IMPRESSION(S) / ED DIAGNOSES   Final diagnoses:  Nontraumatic compression fracture of L1 vertebra, initial encounter ([place])  Compression fracture of L2 vertebra, initial encounter ([place])     Rx / DC Orders   ED Discharge Orders          Ordered    HYDROcodone-acetaminophen (NORCO/VICODIN) 5-325 MG tablet  Every 4 hours PRN        06/10/22 0528             Note:  This document was prepared using Dragon voice recognition software and may include unintentional dictation errors.   Hinda Kehr, MD 06/10/22 [phone]

## 2022-06-12 ENCOUNTER — Other Ambulatory Visit: Payer: Self-pay | Admitting: Nurse Practitioner

## 2022-06-12 DIAGNOSIS — I6523 Occlusion and stenosis of bilateral carotid arteries: Secondary | ICD-10-CM

## 2022-06-12 DIAGNOSIS — R300 Dysuria: Secondary | ICD-10-CM

## 2022-06-12 DIAGNOSIS — J4489 Other specified chronic obstructive pulmonary disease: Secondary | ICD-10-CM

## 2022-06-12 DIAGNOSIS — I200 Unstable angina: Secondary | ICD-10-CM

## 2022-06-12 DIAGNOSIS — I10 Essential (primary) hypertension: Secondary | ICD-10-CM

## 2022-06-12 DIAGNOSIS — Z0001 Encounter for general adult medical examination with abnormal findings: Secondary | ICD-10-CM

## 2022-06-12 DIAGNOSIS — M2550 Pain in unspecified joint: Secondary | ICD-10-CM

## 2022-06-12 DIAGNOSIS — F411 Generalized anxiety disorder: Secondary | ICD-10-CM

## 2022-06-15 ENCOUNTER — Ambulatory Visit
Admission: RE | Admit: 2022-06-15 | Discharge: 2022-06-15 | Disposition: A | Discharge status: Home / Self Care | Payer: Self-pay | Source: Ambulatory Visit | Attending: Neurosurgery | Admitting: Neurosurgery

## 2022-06-15 ENCOUNTER — Other Ambulatory Visit: Payer: Self-pay

## 2022-06-15 DIAGNOSIS — Z049 Encounter for examination and observation for unspecified reason: Secondary | ICD-10-CM

## 2022-06-16 ENCOUNTER — Ambulatory Visit (INDEPENDENT_AMBULATORY_CARE_PROVIDER_SITE_OTHER): Payer: Medicare Other | Admitting: Neurosurgery

## 2022-06-16 DIAGNOSIS — S32020A Wedge compression fracture of second lumbar vertebra, initial encounter for closed fracture: Secondary | ICD-10-CM

## 2022-06-16 DIAGNOSIS — S32010A Wedge compression fracture of first lumbar vertebra, initial encounter for closed fracture: Secondary | ICD-10-CM | POA: Diagnosis not present

## 2022-06-16 NOTE — Progress Notes (Signed)
Referring Physician:  Jonetta Osgood, NP [place],  [place] [address]  Primary Physician:  Jonetta Osgood, NP  History of Present Illness: 06/16/2022 Christina Hester is here today with a chief complaint of back pain.  She was seen in the emergency department on November 25 with back pain.  She was diagnosed with an L1 and L2 minor compression fractures.  She has still had some back pain since that time, but no other neurologic symptoms.  She has chronic obstructive pulmonary disease at baseline.  She is taking half the prescribed dose of medication as she is very slight in stature.  I have utilized the care everywhere function in epic to review the outside records available from external health systems.  Review of Systems:  A 10 point review of systems is negative, except for the pertinent positives and negatives detailed in the HPI.  Past Medical History: Past Medical History:  Diagnosis Date   AAA (abdominal aortic aneurysm) ([facility])    a. 05/2016 Abd U/S: 3.02 x 3.02 AAA.   Anxiety    CAD (coronary artery disease)    a. 02/2017 NSTEMI/Cath: LM nl, LAD mild dzs, D2 min irregs, LCX mild dzs, OM2 50ost, RCA 95p, 172m L-->R collats, EF 55-65%.   COPD (chronic obstructive pulmonary disease) ([facility])    Diastolic dysfunction    a. 02/2017 Echo: EF 55-60%, gr1 DD, ? inf HK, mild MR.   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 05/2016 ABI's: R 1.22, L 1.11.   Tobacco abuse     Past Surgical History: Past Surgical History:  Procedure Laterality Date   ABDOMINAL HYSTERECTOMY     APPENDECTOMY     BREAST EXCISIONAL BIOPSY Left 80's or 90's   NEG   CARDIAC CATHETERIZATION     COLONOSCOPY N/A 05/22/2018   Procedure: COLONOSCOPY;  Surgeon: VLin Landsman MD;  Location: [facility] ENDOSCOPY;  Service: Gastroenterology;   Laterality: N/A;   COLONOSCOPY WITH PROPOFOL N/A 07/19/2020   Procedure: COLONOSCOPY WITH PROPOFOL;  Surgeon: Toledo, TBenay Pike MD;  Location: [facility] ENDOSCOPY;  Service: Gastroenterology;  Laterality: N/A;   ENDARTERECTOMY Right 11/06/2019   Procedure: ENDARTERECTOMY CAROTID;  Surgeon: DAlgernon Huxley MD;  Location: [facility] ORS;  Service: Vascular;  Laterality: Right;   LEFT HEART CATH Bilateral 03/05/2017   Procedure: Left Heart Cath with poss PCI;  Surgeon: AWellington Hampshire MD;  [facility];  Service: Cardiovascular;  Laterality: Bilateral;    Allergies: Allergies as of 06/16/2022 - Review Complete 06/09/2022  Allergen Reaction Noted   Aspirin Tinitus 11/25/2015   Budesonide-formoterol fumarate Other (See Comments) 05/09/2016   Prednisone Other (See Comments) 03/02/2017   Sulfa antibiotics Nausea And Vomiting 11/25/2015   Tramadol Other (See Comments) 02/09/2021    Medications:  Social History: Social History   Tobacco Use   Smoking status: Former    Packs/day: 0.25    Types: Cigarettes    Quit date: 07/12/2017    Years since quitting: 4.9   Smokeless tobacco: Never   Tobacco comments:    currently 90 days without smoking  Vaping Use   Vaping Use: Never used  Substance Use Topics   Alcohol use: No   Drug use: No    Family Medical History: Family History  Problem  Relation Age of Onset   Breast cancer Mother    Dementia Mother    Hypertension Mother    Breast cancer Maternal Aunt    Breast cancer Maternal Aunt    Breast cancer Maternal Aunt    CVA Father     Physical Examination: Telephone visit   Medical Decision Making  Imaging: CT AP 06/10/2022 IMPRESSION: 1. Limited bowel detail in this patient without oral contrast. No obvious bowel obstruction or inflammation.   2. Osteopenia with age indeterminate L1 and L2 compression, new since February. No retropulsion or complicating features. If specific therapy such as vertebroplasty is  desired, Lumbar MRI or Nuclear Medicine Whole-body Bone Scan would best determine acuity.   3. Progressed since 2019 Infrarenal Abdominal Aortic Aneurysm, now up to 37 mm. Recommend follow-up every 2 years. Reference: J Am Coll Radiol 1610;96:045-409. Aortic aneurysm NOS (ICD10-I71.9). Aortic Atherosclerosis (ICD10-I70.0).   4. 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   5. Otherwise stable CT appearance of the abdomen and pelvis since 2019.     Electronically Signed   By: Genevie Ann M.D.   On: 06/10/2022 04:21    I have personally reviewed the images and agree with the above interpretation.  Assessment and Plan: [name] is a pleasant 81 y.o. female with L1 and L2 compression fractures.  She is having expected levels of pain.  She is taking pain medication less than directed, but has reasonable pain control currently.  I recommended that she refrain from lifting anything heavier than 10 pounds, and be very careful about bending or twisting.  I expect that her pain will improve over the next few weeks.  I will see her back in 6 weeks with x-rays.  I asked her to call the office if she needs refills on medications.  This visit was performed via telephone.  Patient location: home Provider location: office  I spent a total of 15 minutes non-face-to-face activities for this visit on the date of this encounter including review of current clinical condition and response to treatment.   Thank you for involving me in the care of this patient.      Jehu Mccauslin K. Izora Ribas MD, [facility] Neurosurgery

## 2022-06-19 ENCOUNTER — Telehealth: Payer: Self-pay

## 2022-06-19 DIAGNOSIS — S32010A Wedge compression fracture of first lumbar vertebra, initial encounter for closed fracture: Secondary | ICD-10-CM

## 2022-06-19 DIAGNOSIS — S32020A Wedge compression fracture of second lumbar vertebra, initial encounter for closed fracture: Secondary | ICD-10-CM

## 2022-06-19 MED ORDER — HYDROCODONE-ACETAMINOPHEN 5-325 MG PO TABS: 1.0000 | ORAL_TABLET | Freq: Four times a day (QID) | ORAL | 0 refills | Status: DC | PRN

## 2022-06-19 NOTE — Telephone Encounter (Signed)
PMP reviewed and is appropriate. Refill for norco sent to pharmacy.   Please let her know to continue taking the least amount possible and let her know medication was sent to [facility].

## 2022-06-19 NOTE — Telephone Encounter (Signed)
Patient notified of refill.

## 2022-06-19 NOTE — Telephone Encounter (Signed)
-----   Message from [place] sent at 06/19/2022  9:43 AM EST ----- Regarding: Med Refill Looking to get refill of script given while in the ER  HYDROcodone-acetaminophen (NORCO/VICODIN) 5-325 MG  [place]  [phone]

## 2022-06-27 ENCOUNTER — Ambulatory Visit: Payer: Medicare Other | Attending: Medical | Admitting: Cardiology

## 2022-06-27 ENCOUNTER — Encounter: Payer: Self-pay | Admitting: Cardiology

## 2022-06-27 VITALS — BP 142/69 | HR 76 | Ht 59.0 in | Wt 76.6 lb

## 2022-06-27 DIAGNOSIS — I2510 Atherosclerotic heart disease of native coronary artery without angina pectoris: Secondary | ICD-10-CM | POA: Diagnosis not present

## 2022-06-27 DIAGNOSIS — I739 Peripheral vascular disease, unspecified: Secondary | ICD-10-CM | POA: Insufficient documentation

## 2022-06-27 DIAGNOSIS — E782 Mixed hyperlipidemia: Secondary | ICD-10-CM | POA: Diagnosis not present

## 2022-06-27 DIAGNOSIS — I2583 Coronary atherosclerosis due to lipid rich plaque: Secondary | ICD-10-CM | POA: Diagnosis not present

## 2022-06-27 DIAGNOSIS — J449 Chronic obstructive pulmonary disease, unspecified: Secondary | ICD-10-CM | POA: Diagnosis not present

## 2022-06-27 DIAGNOSIS — I7143 Infrarenal abdominal aortic aneurysm, without rupture: Secondary | ICD-10-CM | POA: Diagnosis not present

## 2022-06-27 DIAGNOSIS — I10 Essential (primary) hypertension: Secondary | ICD-10-CM | POA: Diagnosis not present

## 2022-06-27 DIAGNOSIS — R002 Palpitations: Secondary | ICD-10-CM

## 2022-06-27 NOTE — Patient Instructions (Signed)
Medication Instructions:  - Your physician recommends that you continue on your current medications as directed. Please refer to the Current Medication list given to you today.  *If you need a refill on your cardiac medications before your next appointment, please call your pharmacy*   Lab Work: - none ordered  If you have labs (blood work) drawn today and your tests are completely normal, you will receive your results only by: [place] (if you have MyChart) OR A paper copy in the mail If you have any lab test that is abnormal or we need to change your treatment, we will call you to review the results.   Testing/Procedures: - none ordered   Follow-Up: At [facility], you and your health needs are our priority.  As part of our continuing mission to provide you with exceptional heart care, we have created designated Provider Care Teams.  These Care Teams include your primary Cardiologist (physician) and Advanced Practice Providers (APPs -  Physician Assistants and Nurse Practitioners) who all work together to provide you with the care you need, when you need it.  We recommend signing up for the patient portal called "MyChart".  Sign up information is provided on this After Visit Summary.  MyChart is used to connect with patients for Virtual Visits (Telemedicine).  Patients are able to view lab/test results, encounter notes, upcoming appointments, etc.  Non-urgent messages can be sent to your provider as well.   To learn more about what you can do with MyChart, go to [email or web address].    Your next appointment:   6 month(s)  The format for your next appointment:   In Person  Provider:   You may see Kathlyn Sacramento, MD or one of the following Advanced Practice Providers on your designated Care Team:    Gerrie Nordmann, NP    Other Instructions N/a  Important Information About Sugar

## 2022-06-27 NOTE — Progress Notes (Unsigned)
Cardiology Clinic Note   Patient Name: Christina Hester Date of Encounter: 06/28/2022  Primary Care Provider:  Jonetta Osgood, NP Primary Cardiologist:  Kathlyn Sacramento, MD  Patient Profile    81 year old female with a history of coronary artery disease, COPD, carotid disease status post right carotid endarterectomy, AAA followed by vascular, tobacco abuse who presents today for follow-up of her coronary artery disease.  Past Medical History    Past Medical History:  Diagnosis Date   AAA (abdominal aortic aneurysm) ([facility])    a. 05/2016 Abd U/S: 3.02 x 3.02 AAA.   Anxiety    CAD (coronary artery disease)    a. 02/2017 NSTEMI/Cath: LM nl, LAD mild dzs, D2 min irregs, LCX mild dzs, OM2 50ost, RCA 95p, 118m L-->R collats, EF 55-65%.   COPD (chronic obstructive pulmonary disease) ([facility])    Diastolic dysfunction    a. 02/2017 Echo: EF 55-60%, gr1 DD, ? inf HK, mild MR.   Hx of basal cell carcinoma    back    Hx of squamous cell carcinoma 12/02/2018   Right lateral breast    Hyperlipidemia    Hypertension    PAD (peripheral artery disease) ([facility]    a. 05/2016 ABI's: R 1.22, L 1.11.   Tobacco abuse    Past Surgical History:  Procedure Laterality Date   ABDOMINAL HYSTERECTOMY     APPENDECTOMY     BREAST EXCISIONAL BIOPSY Left 80's or 90's   NEG   CARDIAC CATHETERIZATION     COLONOSCOPY N/A 05/22/2018   Procedure: COLONOSCOPY;  Surgeon: VLin Landsman MD;  Location: [facility] END[facility];  Service: Gastroenterology;  Laterality: N/A;   COLONOSCOPY WITH PROPOFOL N/A 07/19/2020   Procedure: COLONOSCOPY WITH PROPOFOL;  Surgeon: Toledo, TBenay Pike MD;  Location: [facility];  Service: Gastroenterology;  Laterality: N/A;   ENDARTERECTOMY Right 11/06/2019   Procedure: ENDARTERECTOMY CAROTID;  Surgeon: DAlgernon Huxley MD;  Location: [facility] ORS;  Service: Vascular;  Laterality: Right;   LEFT HEART CATH Bilateral 03/05/2017   Procedure: Left Heart Cath with poss PCI;  Surgeon: AWellington Hampshire MD;  [facility];  Service: Cardiovascular;  Laterality: Bilateral;    Allergies  Allergies  Allergen Reactions   Aspirin Tinitus   Budesonide-Formoterol Fumarate Other (See Comments)    Patient felt "out of her mind" and angry. Patient felt "out of her mind" and angry.   Prednisone Other (See Comments)    Makes violent   Sulfa Antibiotics Nausea And Vomiting   Tramadol Other (See Comments)    History of Present Illness    AShalise Rosadois an 81year old female with previously mentioned past medical history of coronary artery disease, COPD, carotid disease status post right carotid endarterectomy, AAA followed by vascular, and tobacco abuse.  Cardiac catheterization was done in 02/2017 with severe one-vessel CAD with chronically occluded RCA with well-developed left-to-right collaterals.  Mild to moderate nonobstructive disease to the left coronary arteries with extremely tortuous vessels.  EF normal with mildly elevated LVEDP.  Documented history of intolerance to aspirin due to tinnitus but tolerated clopidogrel well.  Declined statins due to fear of side effects.  Trialed on Zetia but reported leg pain and had to stop therapy.  Saw by Dr. Dew/6/21 with CT angiogram with official report suggested 55% right ICA stenosis and 50% left ICA stenosis.  However on review by Dr. DLucky Cowboyestimated 70-75% stenosis of the right ICA recent carotid duplex suggestive of high-grade right carotid artery  stenosis.  Echocardiogram 10/14/2020 at outside facility with LVEF of 61%, no regional wall motion abnormalities, mild diastolic dysfunction, mild aortic valve sclerosis without stenosis, mild MR.  Echocardiogram done 09/2021 revealed normal LVEF.  10/2021 Myoview revealed normal ventricular systolic function, no significant ischemia or scar was considered low risk study.  Holter monitor revealed overall normal rhythm average heart rate 72 bpm with 14 runs lasting 14 beats.   Rare irregular beats without pauses.  Last seen in clinic 01/03/2022 she reported no chest pain but had chronic shortness of breath from moderate to severe COPD.  No changes were made to medication and no further testing ordered.  She returns to clinic today stating that overall she has been doing well. She occasionally has palpitations but denies chest pain or peripheral edema. Her COPD has been flaring since the weather change. She also was found to have several compression fractures in her back. She stated that her steroid dosing has been increased as well due to her COPD. Her activity has also been limited over the last two weeks due to the compression fractures. She denies any recent hospitalizations or emergency department visits.   Home Medications    Current Outpatient Medications  Medication Sig Dispense Refill   amLODipine (NORVASC) 5 MG tablet Take 5 mg by mouth daily.     azithromycin (ZITHROMAX) 250 MG tablet Take 1 tablet by mouth every Monday, Wednesday, and Friday.     benzonatate (TESSALON) 200 MG capsule Take 200 mg by mouth as needed for cough.     bisoprolol (ZEBETA) 5 MG tablet Take 1 tablet (5 mg total) by mouth daily. 90 tablet 3   buPROPion (WELLBUTRIN) 100 MG tablet Take 1 tablet (100 mg total) by mouth daily. 90 tablet 3   clopidogrel (PLAVIX) 75 MG tablet Take 1 tablet (75 mg total) by mouth daily. 90 tablet 3   clotrimazole (MYCELEX) 10 MG troche Take 10 mg by mouth as needed (thrush).     ipratropium-albuterol (DUONEB) 0.5-2.5 (3) MG/3ML SOLN USE 1 VIAL IN NEBULIZER EVERY 4 HOURS 120 mL 1   LORazepam (ATIVAN) 0.5 MG tablet TAKE 1/2 TO 1 TABLET BY MOUTH EVERY 8 HOURS AS NEEDED FOR ANXIETY OR SLEEP 60 tablet 2   meloxicam (MOBIC) 15 MG tablet Take 1 tablet (15 mg total) by mouth daily. (Patient taking differently: Take 15 mg by mouth as needed for pain.) 30 tablet 2   nitroGLYCERIN (NITROSTAT) 0.4 MG SL tablet Place 1 tablet (0.4 mg total) under the tongue every 5 (five)  minutes as needed for chest pain. 20 tablet 12   nystatin (MYCOSTATIN) 100000 UNIT/ML suspension Take 5 mLs (500,000 Units total) by mouth 4 (four) times daily. (Patient taking differently: Take 5 mLs by mouth as needed (thrush).) 200 mL 0   ondansetron (ZOFRAN) 4 MG tablet Take 1 tablet (4 mg total) by mouth every 8 (eight) hours as needed for nausea or vomiting. 20 tablet 0   pantoprazole (PROTONIX) 40 MG tablet Take 1 tablet (40 mg total) by mouth daily. 30 tablet 3   predniSONE (DELTASONE) 1 MG tablet Take 3 mg by mouth daily with breakfast.     VENTOLIN HFA 108 (90 Base) MCG/ACT inhaler INHALE 2 PUFFS INTO THE LUNGS EVERY 6 HOURS AS NEEDED FOR WHEEZING OR SHORTNESS OF BREATH. 18 g 3   clotrimazole (MYCELEX) 10 MG troche Take 1 tablet (10 mg total) by mouth 5 (five) times daily. (Patient not taking: Reported on 06/27/2022) 70 Troche 2  HYDROcodone-acetaminophen (NORCO/VICODIN) 5-325 MG tablet Take 1 tablet by mouth every 6 (six) hours as needed for moderate pain or severe pain. (Patient not taking: Reported on 06/27/2022) 20 tablet 0   No current facility-administered medications for this visit.     Family History    Family History  Problem Relation Age of Onset   Breast cancer Mother    Dementia Mother    Hypertension Mother    Breast cancer Maternal Aunt    Breast cancer Maternal Aunt    Breast cancer Maternal Aunt    CVA Father    She indicated that her mother is deceased. She indicated that her father is deceased.  Social History    Social History   Socioeconomic History   Marital status: Divorced    Spouse name: Not on file   Number of children: Not on file   Years of education: Not on file   Highest education level: Not on file  Occupational History   Not on file  Tobacco Use   Smoking status: Former    Packs/day: 0.25    Types: Cigarettes    Quit date: 07/12/2017    Years since quitting: 4.9   Smokeless tobacco: Never   Tobacco comments:    currently 90 days  without smoking  Vaping Use   Vaping Use: Never used  Substance and Sexual Activity   Alcohol use: No   Drug use: No   Sexual activity: Not on file  Other Topics Concern   Not on file  Social History Narrative   Not on file   Social Determinants of Health   Financial Resource Strain: Not on file  Food Insecurity: Not on file  Transportation Needs: Not on file  Physical Activity: Not on file  Stress: Not on file  Social Connections: Not on file  Intimate Partner Violence: Not on file     Review of Systems    General:  No chills, fever, night sweats or weight changes. Endorses fatigue. Cardiovascular:  No chest pain, endorses dyspnea on exertion, edema, orthopnea, endorses palpitations, paroxysmal nocturnal dyspnea. Dermatological: No rash, lesions/masses Respiratory: No cough, endorses dyspnea Urologic: No hematuria, dysuria Musculoskeletal: Endorses back discomfort Abdominal:   No nausea, vomiting, diarrhea, bright red blood per rectum, melena, or hematemesis Neurologic:  No visual changes, wkns, changes in mental status. All other systems reviewed and are otherwise negative except as noted above.   Physical Exam    VS:  BP (!) 142/69   Pulse 76   Ht '4\' 11"'$  (1.499 m)   Wt 76 lb 9.6 oz (34.7 kg)   SpO2 95%   BMI 15.47 kg/m  , BMI Body mass index is 15.47 kg/m.     GEN: Frail appearing, in no acute distress. HEENT: normal. Glasses on. Neck: Supple, no JVD, carotid bruits, or masses. Cardiac: RRR, no murmurs, rubs, or gallops. No clubbing, cyanosis, edema.  Radials 2+/PT 2+ and equal bilaterally.  Respiratory:  Respirations regular and unlabored, diminished to auscultation bilaterally. GI: Soft, nontender, nondistended, BS + x 4. MS: no deformity or atrophy. Skin: warm and dry, no rash. Neuro:  Strength and sensation are intact. Psych: Normal affect.  Accessory Clinical Findings    ECG personally reviewed by me today-sinus rhythm with sinus arrhythmia with a  rate of 76, right axis deviation, ST depression- No acute changes  Lab Results  Component Value Date   WBC 7.8 06/09/2022   HGB 12.6 06/09/2022   HCT 35.8 (L) 06/09/2022   MCV  87.1 06/09/2022   PLT 255 06/09/2022   Lab Results  Component Value Date   CREATININE 0.69 06/09/2022   BUN 13 06/09/2022   NA 126 (L) 06/09/2022   K 3.8 06/09/2022   CL 86 (L) 06/09/2022   CO2 28 06/09/2022   Lab Results  Component Value Date   ALT 23 06/09/2022   AST 31 06/09/2022   ALKPHOS 79 06/09/2022   BILITOT 0.8 06/09/2022   Lab Results  Component Value Date   CHOL 265 (H) 04/04/2022   HDL 106 04/04/2022   LDLCALC 145 (H) 04/04/2022   TRIG 87 04/04/2022   CHOLHDL 2.5 04/04/2022    No results found for: "HGBA1C"  Assessment & Plan   1.  Palpitations with increased amount noted since steroids were increased with current COPD flare.  She remains asymptomatic.  Previous heart monitor showed predominantly normal sinus rhythm with 14 short runs of SVT with rare PACs/PVCs.  She has been continued on bisoprolol 5 mg daily.  2.  Hypertension with blood pressure today 142/69.  She has been continued on amlodipine 5 mg daily, bisoprolol 5 mg daily, and encouraged to continue to monitor blood pressure at home as well.  3.  COPD with dyspnea on exertion.  Patient states that she has been having issues with her COPD as of late and steroid therapy has been increased.  She continues to follow with [facility] pulmonology.  She is also been congratulated on quitting smoking approximately 2 years prior.   4.  Mixed hyperlipidemia with an LDL of 145.  She has a documented statin intolerance.  Did discuss injectables for Repatha and Praluent.  Patient states she continues to be uninterested in starting any therapy for her cholesterol.  5.  Peripheral vascular disease with a right ABI 0.93 and left ABI 0.77 in 08/2021.  She continues on clopidogrel 75 mg daily but is unable to take aspirin therapy with stated allergy  of tinnitus.  She continues to follow with vascular surgery.  6.  Abdominal aortic aneurysm at 3.4 cm on ultrasound duplex 08/2021 essentially unchanged from prior study.  She continues to follow with vascular surgery.  7.  Coronary artery disease with severe one-vessel coronary artery disease with chronically occluded right coronary artery with well-developed left-to-right collaterals.  Mild to moderate nonobstructive disease affecting the left system.  Findings from left heart catheterization 03/05/2017.  She is continued on clopidogrel 75 mg daily, bisoprolol 5 mg daily, unable to take aspirin due to allergy, and Nitrostat 0.4 mg sublingual as needed.  8.  Disposition patient return to clinic to see MD/APP in 6 months or sooner if needed.  Arnold Depinto, NP 06/28/2022, 12:39 PM

## 2022-06-28 ENCOUNTER — Ambulatory Visit: Payer: Medicare Other | Admitting: Medical

## 2022-07-14 ENCOUNTER — Telehealth: Payer: Self-pay

## 2022-07-14 ENCOUNTER — Other Ambulatory Visit: Payer: Self-pay | Admitting: Neurosurgery

## 2022-07-14 MED ORDER — GABAPENTIN 100 MG PO CAPS: 100.0000 mg | ORAL_CAPSULE | Freq: Three times a day (TID) | ORAL | 0 refills | Status: DC

## 2022-07-14 NOTE — Telephone Encounter (Signed)
Patient called stating that she hasn't been taking the Hydrocodone but she took some this morning and she has started having some numbness and tingling in the face. I told her to not take any more and wanted to check with you if there is something else we can recommend? She did make me aware that she is in Prednisone for COPD.

## 2022-07-14 NOTE — Telephone Encounter (Signed)
Patient has been notified about Danielles message. She stated that the facial numbness has started to subside but she verbalized understanding about going to the ER if it got worse.

## 2022-07-14 NOTE — Telephone Encounter (Signed)
Spoke with the patient and she would like to try a low dose of Gabapentin.   [place]

## 2022-07-14 NOTE — Progress Notes (Signed)
Gabapentin '100mg'$  TID sent in to patient pharmacy. She was instructed to discontinue Norco

## 2022-07-18 ENCOUNTER — Other Ambulatory Visit: Payer: Self-pay

## 2022-07-18 ENCOUNTER — Ambulatory Visit
Admission: RE | Admit: 2022-07-18 | Discharge: 2022-07-18 | Disposition: A | Discharge status: Home / Self Care | Payer: Medicare Other | Source: Ambulatory Visit | Attending: Neurosurgery | Admitting: Neurosurgery

## 2022-07-18 DIAGNOSIS — S32020D Wedge compression fracture of second lumbar vertebra, subsequent encounter for fracture with routine healing: Secondary | ICD-10-CM

## 2022-07-18 DIAGNOSIS — S32010A Wedge compression fracture of first lumbar vertebra, initial encounter for closed fracture: Secondary | ICD-10-CM

## 2022-07-18 DIAGNOSIS — S32020A Wedge compression fracture of second lumbar vertebra, initial encounter for closed fracture: Secondary | ICD-10-CM | POA: Diagnosis not present

## 2022-07-18 DIAGNOSIS — M419 Scoliosis, unspecified: Secondary | ICD-10-CM | POA: Diagnosis not present

## 2022-07-18 DIAGNOSIS — M47816 Spondylosis without myelopathy or radiculopathy, lumbar region: Secondary | ICD-10-CM | POA: Diagnosis not present

## 2022-07-20 ENCOUNTER — Telehealth: Payer: Self-pay

## 2022-07-20 ENCOUNTER — Ambulatory Visit (INDEPENDENT_AMBULATORY_CARE_PROVIDER_SITE_OTHER): Payer: Medicare Other | Admitting: Nurse Practitioner

## 2022-07-20 ENCOUNTER — Encounter: Payer: Self-pay | Admitting: Nurse Practitioner

## 2022-07-20 ENCOUNTER — Encounter: Payer: Self-pay | Admitting: Neurosurgery

## 2022-07-20 ENCOUNTER — Other Ambulatory Visit: Payer: Self-pay

## 2022-07-20 ENCOUNTER — Ambulatory Visit (INDEPENDENT_AMBULATORY_CARE_PROVIDER_SITE_OTHER): Payer: Medicare Other | Admitting: Neurosurgery

## 2022-07-20 VITALS — BP 130/60 | HR 78 | Temp 97.2°F | Resp 16 | Ht 59.0 in | Wt 72.8 lb

## 2022-07-20 VITALS — BP 172/89 | HR 84 | Ht 59.0 in | Wt 74.0 lb

## 2022-07-20 DIAGNOSIS — S32020D Wedge compression fracture of second lumbar vertebra, subsequent encounter for fracture with routine healing: Secondary | ICD-10-CM | POA: Diagnosis not present

## 2022-07-20 DIAGNOSIS — Z23 Encounter for immunization: Secondary | ICD-10-CM

## 2022-07-20 DIAGNOSIS — S32010A Wedge compression fracture of first lumbar vertebra, initial encounter for closed fracture: Secondary | ICD-10-CM | POA: Diagnosis not present

## 2022-07-20 DIAGNOSIS — S32010D Wedge compression fracture of first lumbar vertebra, subsequent encounter for fracture with routine healing: Secondary | ICD-10-CM

## 2022-07-20 DIAGNOSIS — M2550 Pain in unspecified joint: Secondary | ICD-10-CM

## 2022-07-20 DIAGNOSIS — I10 Essential (primary) hypertension: Secondary | ICD-10-CM

## 2022-07-20 DIAGNOSIS — J4489 Other specified chronic obstructive pulmonary disease: Secondary | ICD-10-CM

## 2022-07-20 DIAGNOSIS — I200 Unstable angina: Secondary | ICD-10-CM

## 2022-07-20 DIAGNOSIS — F411 Generalized anxiety disorder: Secondary | ICD-10-CM | POA: Diagnosis not present

## 2022-07-20 DIAGNOSIS — R300 Dysuria: Secondary | ICD-10-CM

## 2022-07-20 DIAGNOSIS — Z0001 Encounter for general adult medical examination with abnormal findings: Secondary | ICD-10-CM

## 2022-07-20 DIAGNOSIS — I6523 Occlusion and stenosis of bilateral carotid arteries: Secondary | ICD-10-CM

## 2022-07-20 MED ORDER — LORAZEPAM 0.5 MG PO TABS: ORAL_TABLET | ORAL | 2 refills | Status: DC

## 2022-07-20 MED ORDER — BACLOFEN 10 MG PO TABS: 5.0000 mg | ORAL_TABLET | Freq: Three times a day (TID) | ORAL | 1 refills | Status: AC | PRN

## 2022-07-20 MED ORDER — MELOXICAM 15 MG PO TABS: 15.0000 mg | ORAL_TABLET | Freq: Every day | ORAL | 2 refills | Status: DC

## 2022-07-20 MED ORDER — ZOSTER VAC RECOMB ADJUVANTED 50 MCG/0.5ML IM SUSR: 0.5000 mL | Freq: Once | INTRAMUSCULAR | 0 refills | Status: AC

## 2022-07-20 MED ORDER — ZOSTER VAC RECOMB ADJUVANTED 50 MCG/0.5ML IM SUSR: 0.5000 mL | Freq: Once | INTRAMUSCULAR | 0 refills | Status: DC

## 2022-07-20 NOTE — Progress Notes (Signed)
[facility] [place], [place] [address]  Internal MEDICINE  Office Visit Note  Patient Name: Christina Hester  [id number]  [id number]  Date of Service: 07/20/2022  Chief Complaint  Patient presents with   Follow-up   Hyperlipidemia   Hypertension    HPI Kenetra presents for a follow up visit for hypertension, anxiety and to review imaging results.  Allergy to hydrocodone -- causes facial numbness -- put on allergy list Increased anxiety -- r/t breathing and back pain, due for refills  L1 and L2 vertebrae compression fracture -- no injury, probably pathological. Declined DEXA scan. Discussed xray results and CT scan results with patient Left renal atrophy -- creatinine and GFR levels are normal on most recent labs .    Current Medication: Outpatient Encounter Medications as of 07/20/2022  Medication Sig   amLODipine (NORVASC) 5 MG tablet Take 5 mg by mouth daily.   azithromycin (ZITHROMAX) 250 MG tablet Take 1 tablet by mouth every Monday, Wednesday, and Friday.   benzonatate (TESSALON) 200 MG capsule Take 200 mg by mouth as needed for cough.   bisoprolol (ZEBETA) 5 MG tablet Take 1 tablet (5 mg total) by mouth daily.   buPROPion (WELLBUTRIN) 100 MG tablet Take 1 tablet (100 mg total) by mouth daily.   clopidogrel (PLAVIX) 75 MG tablet Take 1 tablet (75 mg total) by mouth daily.   clotrimazole (MYCELEX) 10 MG troche Take 1 tablet (10 mg total) by mouth 5 (five) times daily.   clotrimazole (MYCELEX) 10 MG troche Take 10 mg by mouth as needed (thrush).   ipratropium-albuterol (DUONEB) 0.5-2.5 (3) MG/3ML SOLN USE 1 VIAL IN NEBULIZER EVERY 4 HOURS   nitroGLYCERIN (NITROSTAT) 0.4 MG SL tablet Place 1 tablet (0.4 mg total) under the tongue every 5 (five) minutes as needed for chest pain.   nystatin (MYCOSTATIN) 100000 UNIT/ML suspension Take 5 mLs (500,000 Units total) by mouth 4 (four) times daily. (Patient taking differently: Take 5 mLs by mouth as needed  (thrush).)   ondansetron (ZOFRAN) 4 MG tablet Take 1 tablet (4 mg total) by mouth every 8 (eight) hours as needed for nausea or vomiting.   pantoprazole (PROTONIX) 40 MG tablet Take 1 tablet (40 mg total) by mouth daily.   predniSONE (DELTASONE) 1 MG tablet Take 3 mg by mouth daily with breakfast.   VENTOLIN HFA 108 (90 Base) MCG/ACT inhaler INHALE 2 PUFFS INTO THE LUNGS EVERY 6 HOURS AS NEEDED FOR WHEEZING OR SHORTNESS OF BREATH.   [DISCONTINUED] gabapentin (NEURONTIN) 100 MG capsule Take 1 capsule (100 mg total) by mouth 3 (three) times daily.   [DISCONTINUED] HYDROcodone-acetaminophen (NORCO/VICODIN) 5-325 MG tablet Take 1 tablet by mouth every 6 (six) hours as needed for moderate pain or severe pain.   [DISCONTINUED] LORazepam (ATIVAN) 0.5 MG tablet TAKE 1/2 TO 1 TABLET BY MOUTH EVERY 8 HOURS AS NEEDED FOR ANXIETY OR SLEEP   [DISCONTINUED] meloxicam (MOBIC) 15 MG tablet Take 1 tablet (15 mg total) by mouth daily. (Patient taking differently: Take 15 mg by mouth as needed for pain.)   [DISCONTINUED] Zoster Vaccine Adjuvanted [facility]) injection Inject 0.5 mLs into the muscle once.   [DISCONTINUED] LORazepam (ATIVAN) 0.5 MG tablet TAKE 1/2 TO 1 TABLET BY MOUTH EVERY 8 HOURS AS NEEDED FOR ANXIETY OR SLEEP   [DISCONTINUED] meloxicam (MOBIC) 15 MG tablet Take 1 tablet (15 mg total) by mouth daily.   [DISCONTINUED] Zoster Vaccine Adjuvanted [facility]) injection Inject 0.5 mLs into the muscle once for 1 dose.  No facility-administered encounter medications on file as of 07/20/2022.    Surgical History: Past Surgical History:  Procedure Laterality Date   ABDOMINAL HYSTERECTOMY     APPENDECTOMY     BREAST EXCISIONAL BIOPSY Left 80's or 90's   NEG   CARDIAC CATHETERIZATION     COLONOSCOPY N/A 05/22/2018   Procedure: COLONOSCOPY;  Surgeon: Lin Landsman, MD;  Location: [facility];  Service: Gastroenterology;  Laterality: N/A;   COLONOSCOPY WITH PROPOFOL N/A 07/19/2020   Procedure:  COLONOSCOPY WITH PROPOFOL;  Surgeon: Toledo, Benay Pike, MD;  Location: [facility];  Service: Gastroenterology;  Laterality: N/A;   ENDARTERECTOMY Right 11/06/2019   Procedure: ENDARTERECTOMY CAROTID;  Surgeon: Algernon Huxley, MD;  Location: [facility] ORS;  Service: Vascular;  Laterality: Right;   LEFT HEART CATH Bilateral 03/05/2017   Procedure: Left Heart Cath with poss PCI;  Surgeon: Wellington Hampshire, MD;  Location: [place] CV LAB;  Service: Cardiovascular;  Laterality: Bilateral;    Medical History: Past Medical History:  Diagnosis Date   AAA (abdominal aortic aneurysm) ([place])    a. 05/2016 Abd U/S: 3.02 x 3.02 AAA.   Anxiety    CAD (coronary artery disease)    a. 02/2017 NSTEMI/Cath: LM nl, LAD mild dzs, D2 min irregs, LCX mild dzs, OM2 50ost, RCA 95p, 132m L-->R collats, EF 55-65%.   COPD (chronic obstructive pulmonary disease) (HCC)    Diastolic dysfunction    a. 02/2017 Echo: EF 55-60%, gr1 DD, ? inf HK, mild MR.   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 05/2016 ABI's: R 1.22, L 1.11.   Tobacco abuse     Family History: Family History  Problem Relation Age of Onset   Breast cancer Mother    Dementia Mother    Hypertension Mother    Breast cancer Maternal Aunt    Breast cancer Maternal Aunt    Breast cancer Maternal Aunt    CVA Father     Social History   Socioeconomic History   Marital status: Divorced    Spouse name: Not on file   Number of children: Not on file   Years of education: Not on file   Highest education level: Not on file  Occupational History   Not on file  Tobacco Use   Smoking status: Former    Packs/day: 0.25    Types: Cigarettes    Quit date: 07/12/2017    Years since quitting: 5.0   Smokeless tobacco: Never   Tobacco comments:    currently 90 days without smoking  Vaping Use   Vaping Use:  Never used  Substance and Sexual Activity   Alcohol use: No   Drug use: No   Sexual activity: Not on file  Other Topics Concern   Not on file  Social History Narrative   Not on file   Social Determinants of Health   Financial Resource Strain: Not on file  Food Insecurity: Not on file  Transportation Needs: Not on file  Physical Activity: Not on file  Stress: Not on file  Social Connections: Not on file  Intimate Partner Violence: Not on file      Review of Systems  Constitutional:  Positive for fatigue. Negative for chills and unexpected weight change.  HENT:  Negative for congestion, rhinorrhea, sneezing  and sore throat.   Eyes:  Negative for redness.  Respiratory:  Positive for shortness of breath (intermittent). Negative for cough, chest tightness and wheezing.   Cardiovascular:  Positive for palpitations. Negative for chest pain.  Gastrointestinal:  Negative for abdominal pain, constipation, diarrhea, nausea and vomiting.  Genitourinary:  Negative for dysuria and frequency.  Musculoskeletal:  Positive for arthralgias, back pain, gait problem, myalgias and neck pain. Negative for joint swelling.  Skin:  Negative for rash.  Neurological:  Positive for tremors. Negative for numbness.       Feeling jittery  Hematological:  Negative for adenopathy. Does not bruise/bleed easily.  Psychiatric/Behavioral:  Negative for behavioral problems (Depression), sleep disturbance and suicidal ideas. The patient is not nervous/anxious.     Vital Signs: BP 130/60 Comment: 127/42  Pulse 78   Temp (!) 97.2 F (36.2 C)   Resp 16   Ht '4\' 11"'$  (1.499 m)   Wt 72 lb 12.8 oz (33 kg)   SpO2 93%   BMI 14.70 kg/m    Physical Exam Vitals reviewed.  Constitutional:      General: She is not in acute distress.    Appearance: Normal appearance. She is underweight. She is not ill-appearing.  HENT:     Head: Normocephalic and atraumatic.  Eyes:     Pupils: Pupils are equal, round, and  reactive to light.  Cardiovascular:     Rate and Rhythm: Normal rate and regular rhythm.  Pulmonary:     Effort: Pulmonary effort is normal. No respiratory distress.     Breath sounds: Normal breath sounds.  Neurological:     Mental Status: She is alert and oriented to person, place, and time.     Cranial Nerves: No cranial nerve deficit.     Coordination: Coordination normal.     Gait: Gait normal.  Psychiatric:        Mood and Affect: Mood normal.        Behavior: Behavior normal.        Assessment/Plan: 1. Closed compression fracture of body of L1 vertebra (HCC) Meloxicam refills ordered, take daily as prescribed and see if this helps relieve some of the pain and swelling   2. Closed compression fracture of L2 lumbar vertebra with routine healing, subsequent encounter See problem #1  3. Generalized anxiety disorder Lorazepam refills ordered, continue medication as prescribed  4. Need for vaccination Shingles vaccine ordered   General Counseling: Jerianne verbalizes understanding of the findings of todays visit and agrees with plan of treatment. I have discussed any further diagnostic evaluation that may be needed or ordered today. We also reviewed her medications today. she has been encouraged to call the office with any questions or concerns that should arise related to todays visit.    No orders of the defined types were placed in this encounter.   Meds ordered this encounter  Medications   DISCONTD: Zoster Vaccine Adjuvanted [facility]) injection    Sig: Inject 0.5 mLs into the muscle once for 1 dose.    Dispense:  0.5 mL    Refill:  0   DISCONTD: LORazepam (ATIVAN) 0.5 MG tablet    Sig: TAKE 1/2 TO 1 TABLET BY MOUTH EVERY 8 HOURS AS NEEDED FOR ANXIETY OR SLEEP    Dispense:  60 tablet    Refill:  2   DISCONTD: meloxicam (MOBIC) 15 MG tablet    Sig: Take 1 tablet (15 mg total) by mouth daily.    Dispense:  30 tablet  Refill:  2    Return in about 3 months  (around 10/11/2022) for F/U, anxiety med refill, Caitlin Ainley PCP.   Total time spent:30 Minutes Time spent includes review of chart, medications, test results, and follow up plan with the patient.   [place] Controlled Substance Database was reviewed by me.  This patient was seen by Jonetta Osgood, FNP-C in collaboration with Dr. Clayborn Bigness as a part of collaborative care agreement.   Tobi Leinweber R. Valetta Fuller, MSN, FNP-C Internal medicine

## 2022-07-20 NOTE — Telephone Encounter (Signed)
[facility] called that pt get med from [facility] and [facility] lorazepam 0.'5mg'$   take 1/2 to 1 tab po every 8 hrs as needed for anxiety 60 with 2 refills as per alyssa

## 2022-07-20 NOTE — Patient Instructions (Signed)
Tylenol dosing:   Maximin dose per 24 hours is 2475 mg. May take 500 mg 4 times daily as needed for mild to moderate pain or fever.

## 2022-07-20 NOTE — Progress Notes (Signed)
Referring Physician:  Jonetta Osgood, NP [place],  [place] [address]  Primary Physician:  Jonetta Osgood, NP  History of Present Illness: 07/20/2022 Christina Hester continues to have pain later in the day.  In the mornings she feels relatively well.  06/16/2022 Christina Hester is here today with a chief complaint of back pain.  She was seen in the emergency department on November 25 with back pain.  She was diagnosed with an L1 and L2 minor compression fractures.  She has still had some back pain since that time, but no other neurologic symptoms.  She has chronic obstructive pulmonary disease at baseline.  She is taking half the prescribed dose of medication as she is very slight in stature.  I have utilized the care everywhere function in epic to review the outside records available from external health systems.  Review of Systems:  A 10 point review of systems is negative, except for the pertinent positives and negatives detailed in the HPI.  Past Medical History: Past Medical History:  Diagnosis Date   AAA (abdominal aortic aneurysm) ([place])    a. 05/2016 Abd U/S: 3.02 x 3.02 AAA.   Anxiety    CAD (coronary artery disease)    a. 02/2017 NSTEMI/Cath: LM nl, LAD mild dzs, D2 min irregs, LCX mild dzs, OM2 50ost, RCA 95p, 164m L-->R collats, EF 55-65%.   COPD (chronic obstructive pulmonary disease) (HCC)    Diastolic dysfunction    a. 02/2017 Echo: EF 55-60%, gr1 DD, ? inf HK, mild MR.   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 05/2016 ABI's: R 1.22, L 1.11.   Tobacco abuse     Past Surgical History: Past Surgical History:  Procedure Laterality Date   ABDOMINAL HYSTERECTOMY     APPENDECTOMY     BREAST EXCISIONAL BIOPSY Left 80's or 90's   NEG   CARDIAC CATHETERIZATION     COLONOSCOPY N/A 05/22/2018    Procedure: COLONOSCOPY;  Surgeon: VLin Landsman MD;  Location: [facility];  Service: Gastroenterology;  Laterality: N/A;   COLONOSCOPY WITH PROPOFOL N/A 07/19/2020   Procedure: COLONOSCOPY WITH PROPOFOL;  Surgeon: Toledo, TBenay Pike MD;  Location: [facility];  Service: Gastroenterology;  Laterality: N/A;   ENDARTERECTOMY Right 11/06/2019   Procedure: ENDARTERECTOMY CAROTID;  Surgeon: DAlgernon Huxley MD;  Location: [facility];  Service: Vascular;  Laterality: Right;   LEFT HEART CATH Bilateral 03/05/2017   Procedure: Left Heart Cath with poss PCI;  Surgeon: AWellington Hampshire MD;  [facility];  Service: Cardiovascular;  Laterality: Bilateral;    Allergies: Allergies as of 07/20/2022 - Review Complete 07/20/2022  Allergen Reaction Noted   Hydrocodone-acetaminophen Other (See Comments) 07/20/2022   Aspirin Tinitus 11/25/2015   Budesonide-formoterol fumarate Other (See Comments) 05/09/2016   Prednisone Other (See Comments) 03/02/2017   Sulfa antibiotics Nausea And Vomiting 11/25/2015   Tramadol Other (See Comments) 02/09/2021    Medications:  Social History: Social History   Tobacco Use   Smoking status: Former    Packs/day: 0.25    Types: Cigarettes    Quit date: 07/12/2017    Years since quitting: 5.0   Smokeless tobacco: Never   Tobacco comments:    currently 90 days without smoking  Vaping Use   Vaping  Use: Never used  Substance Use Topics   Alcohol use: No   Drug use: No    Family Medical History: Family History  Problem Relation Age of Onset   Breast cancer Mother    Dementia Mother    Hypertension Mother    Breast cancer Maternal Aunt    Breast cancer Maternal Aunt    Breast cancer Maternal Aunt    CVA Father     Physical Examination: Vitals:   07/20/22 1540  BP: (!) 172/89  Pulse: 84    Moves all extremities well.  5 out of 5 strength bilateral lower extremities.   Medical Decision Making  Imaging: CT AP  06/10/2022 IMPRESSION: 1. Limited bowel detail in this patient without oral contrast. No obvious bowel obstruction or inflammation.   2. Osteopenia with age indeterminate L1 and L2 compression, new since February. No retropulsion or complicating features. If specific therapy such as vertebroplasty is desired, Lumbar MRI or Nuclear Medicine Whole-body Bone Scan would best determine acuity.   3. Progressed since 2019 Infrarenal Abdominal Aortic Aneurysm, now up to 37 mm. Recommend follow-up every 2 years. Reference: J Am Coll Radiol 7048;88:916-945. Aortic aneurysm NOS (ICD10-I71.9). Aortic Atherosclerosis (ICD10-I70.0).   4. 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   5. Otherwise stable CT appearance of the abdomen and pelvis since 2019.     Electronically Signed   By: Genevie Ann M.D.   On: 06/10/2022 04:21   Lumbar spine x-rays on July 20, 2022 show no evidence of worsening fracture.  I have personally reviewed the images and agree with the above interpretation.  Assessment and Plan: Christina Hester is a pleasant 82 y.o. female with L1 and L2 compression fractures.  She is making slow improvements.  I will start her on a low-dose muscle relaxant.  I will see her back in 6 weeks with flexion-extension x-rays.    I spent a total of 15 minutes in this patient's care today. This time was spent reviewing pertinent records including imaging studies, obtaining and confirming history, performing a directed evaluation, formulating and discussing my recommendations, and documenting the visit within the medical record.      Thank you for involving me in the care of this patient.      Bob Daversa K. Izora Ribas MD, [facility] Neurosurgery

## 2022-08-10 DIAGNOSIS — J449 Chronic obstructive pulmonary disease, unspecified: Secondary | ICD-10-CM | POA: Diagnosis not present

## 2022-08-25 ENCOUNTER — Other Ambulatory Visit: Payer: Self-pay | Admitting: Medical

## 2022-09-04 ENCOUNTER — Ambulatory Visit (INDEPENDENT_AMBULATORY_CARE_PROVIDER_SITE_OTHER): Payer: Medicare Other | Admitting: Nurse Practitioner

## 2022-09-04 ENCOUNTER — Ambulatory Visit
Admission: RE | Admit: 2022-09-04 | Discharge: 2022-09-04 | Disposition: A | Discharge status: Home / Self Care | Payer: Medicare Other | Attending: Nurse Practitioner | Admitting: Nurse Practitioner

## 2022-09-04 ENCOUNTER — Ambulatory Visit
Admission: RE | Admit: 2022-09-04 | Discharge: 2022-09-04 | Disposition: A | Discharge status: Home / Self Care | Payer: Medicare Other | Source: Ambulatory Visit | Attending: Neurosurgery | Admitting: Neurosurgery

## 2022-09-04 ENCOUNTER — Ambulatory Visit: Payer: Medicare Other | Admitting: Nurse Practitioner

## 2022-09-04 ENCOUNTER — Encounter: Payer: Self-pay | Admitting: Nurse Practitioner

## 2022-09-04 ENCOUNTER — Other Ambulatory Visit: Payer: Self-pay

## 2022-09-04 ENCOUNTER — Ambulatory Visit
Admission: RE | Admit: 2022-09-04 | Discharge: 2022-09-04 | Disposition: A | Discharge status: Home / Self Care | Payer: Medicare Other | Source: Ambulatory Visit | Attending: Nurse Practitioner | Admitting: Nurse Practitioner

## 2022-09-04 VITALS — BP 144/67 | HR 85 | Temp 97.6°F | Resp 16 | Ht 59.0 in | Wt 71.6 lb

## 2022-09-04 DIAGNOSIS — Z8739 Personal history of other diseases of the musculoskeletal system and connective tissue: Secondary | ICD-10-CM | POA: Diagnosis not present

## 2022-09-04 DIAGNOSIS — R0602 Shortness of breath: Secondary | ICD-10-CM

## 2022-09-04 DIAGNOSIS — R0989 Other specified symptoms and signs involving the circulatory and respiratory systems: Secondary | ICD-10-CM

## 2022-09-04 DIAGNOSIS — S32020D Wedge compression fracture of second lumbar vertebra, subsequent encounter for fracture with routine healing: Secondary | ICD-10-CM | POA: Diagnosis not present

## 2022-09-04 DIAGNOSIS — J449 Chronic obstructive pulmonary disease, unspecified: Secondary | ICD-10-CM | POA: Diagnosis not present

## 2022-09-04 DIAGNOSIS — S32010A Wedge compression fracture of first lumbar vertebra, initial encounter for closed fracture: Secondary | ICD-10-CM | POA: Insufficient documentation

## 2022-09-04 DIAGNOSIS — J439 Emphysema, unspecified: Secondary | ICD-10-CM | POA: Diagnosis not present

## 2022-09-04 DIAGNOSIS — Z23 Encounter for immunization: Secondary | ICD-10-CM | POA: Diagnosis not present

## 2022-09-04 DIAGNOSIS — F411 Generalized anxiety disorder: Secondary | ICD-10-CM

## 2022-09-04 MED ORDER — LORAZEPAM 0.5 MG PO TABS: ORAL_TABLET | ORAL | 0 refills | Status: DC

## 2022-09-04 MED ORDER — ZOSTER VAC RECOMB ADJUVANTED 50 MCG/0.5ML IM SUSR: 0.5000 mL | Freq: Once | INTRAMUSCULAR | 0 refills | Status: AC

## 2022-09-04 MED ORDER — HYDROXYZINE HCL 10 MG PO TABS: 10.0000 mg | ORAL_TABLET | Freq: Three times a day (TID) | ORAL | 2 refills | Status: DC | PRN

## 2022-09-04 NOTE — Progress Notes (Signed)
[facility], [place] [address]  Internal MEDICINE  Office Visit Note  Patient Name: Christina Hester  [id number]  [id number]  Date of Service: 09/04/2022  Chief Complaint  Patient presents with   Acute Visit    Stress, panic attacks     HPI Christina Hester presents for an acute sick visit for SOB and feeling like she can't breath or take in a full breath Recent Vertebral fractures -- laying around doing nothing letting them heal, ice and heat --hands swell sometimes  Can't take pain meds, or muscle relaxers, has been taking more ativan than how it is prescribed.  Has severe COPD , using rescue inhaler more and nebulizer treatments.       Current Medication:  Outpatient Encounter Medications as of 09/04/2022  Medication Sig   amLODipine (NORVASC) 5 MG tablet Take 5 mg by mouth daily.   azithromycin (ZITHROMAX) 250 MG tablet Take 1 tablet by mouth every Monday, Wednesday, and Friday.   baclofen (LIORESAL) 10 MG tablet Take 0.5 tablets (5 mg total) by mouth 3 (three) times daily as needed for muscle spasms.   benzonatate (TESSALON) 200 MG capsule Take 200 mg by mouth as needed for cough.   bisoprolol (ZEBETA) 5 MG tablet Take 1 tablet (5 mg total) by mouth daily.   buPROPion (WELLBUTRIN) 100 MG tablet Take 1 tablet (100 mg total) by mouth daily.   clopidogrel (PLAVIX) 75 MG tablet Take 1 tablet (75 mg total) by mouth daily.   clotrimazole (MYCELEX) 10 MG troche Take 1 tablet (10 mg total) by mouth 5 (five) times daily.   clotrimazole (MYCELEX) 10 MG troche Take 10 mg by mouth as needed (thrush).   hydrOXYzine (ATARAX) 10 MG tablet Take 1 tablet (10 mg total) by mouth 3 (three) times daily as needed for anxiety.   ipratropium-albuterol (DUONEB) 0.5-2.5 (3) MG/3ML SOLN USE 1 VIAL IN NEBULIZER EVERY 4 HOURS   meloxicam (MOBIC) 15 MG tablet Take 1 tablet (15 mg total) by mouth daily.   nitroGLYCERIN (NITROSTAT) 0.4 MG SL tablet Place 1 tablet (0.4 mg total)  under the tongue every 5 (five) minutes as needed for chest pain.   nystatin (MYCOSTATIN) 100000 UNIT/ML suspension Take 5 mLs (500,000 Units total) by mouth 4 (four) times daily. (Patient taking differently: Take 5 mLs by mouth as needed (thrush).)   ondansetron (ZOFRAN) 4 MG tablet Take 1 tablet (4 mg total) by mouth every 8 (eight) hours as needed for nausea or vomiting.   pantoprazole (PROTONIX) 40 MG tablet Take 1 tablet (40 mg total) by mouth daily. Please keep scheduled appointment   predniSONE (DELTASONE) 1 MG tablet Take 3 mg by mouth daily with breakfast.   VENTOLIN HFA 108 (90 Base) MCG/ACT inhaler INHALE 2 PUFFS INTO THE LUNGS EVERY 6 HOURS AS NEEDED FOR WHEEZING OR SHORTNESS OF BREATH.   [DISCONTINUED] LORazepam (ATIVAN) 0.5 MG tablet TAKE 1/2 TO 1 TABLET BY MOUTH EVERY 8 HOURS AS NEEDED FOR ANXIETY OR SLEEP   [DISCONTINUED] Zoster Vaccine Adjuvanted [facility]) injection Inject 0.5 mLs into the muscle once.   LORazepam (ATIVAN) 0.5 MG tablet TAKE 1/2 TO 1 TABLET BY MOUTH EVERY 8 HOURS AS NEEDED FOR ANXIETY OR SLEEP   Zoster Vaccine Adjuvanted [facility]) injection Inject 0.5 mLs into the muscle once for 1 dose.   [DISCONTINUED] LORazepam (ATIVAN) 0.5 MG tablet TAKE 1/2 TO 1 TABLET BY MOUTH EVERY 8 HOURS AS NEEDED FOR ANXIETY OR SLEEP   No facility-administered encounter medications on  file as of 09/04/2022.      Medical History: Past Medical History:  Diagnosis Date   AAA (abdominal aortic aneurysm) ([place])    a. 05/2016 Abd U/S: 3.02 x 3.02 AAA.   Anxiety    CAD (coronary artery disease)    a. 02/2017 NSTEMI/Cath: LM nl, LAD mild dzs, D2 min irregs, LCX mild dzs, OM2 50ost, RCA 95p, 122m L-->R collats, EF 55-65%.   COPD (chronic obstructive pulmonary disease) (H[facility])    Diastolic dysfunction    a. 02/2017 Echo: EF 55-60%, gr1 DD, ? inf HK, mild MR.   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 05/2016 ABI's: R 1.22, L 1.11.   Tobacco abuse      Vital Signs: BP (!) 144/67   Pulse 85   Temp 97.6 F (36.4 C)   Resp 16   Ht '4\' 11"'$  (1.499 m)   Wt 71 lb 9.6 oz (32.5 kg)   SpO2 96%   BMI 14.46 kg/m    Review of Systems  Constitutional:  Positive for fatigue.  HENT: Negative.    Respiratory:  Positive for cough, chest tightness, shortness of breath and wheezing.   Cardiovascular: Negative.  Negative for chest pain and palpitations.  Gastrointestinal: Negative.  Negative for abdominal pain.  Neurological:  Positive for headaches.  Hematological:  Negative for adenopathy. Does not bruise/bleed easily.    Physical Exam Vitals reviewed.  Constitutional:      General: She is not in acute distress.    Appearance: Normal appearance. She is not ill-appearing.  HENT:     Head: Normocephalic and atraumatic.  Eyes:     Pupils: Pupils are equal, round, and reactive to light.  Cardiovascular:     Rate and Rhythm: Normal rate and regular rhythm.  Pulmonary:     Effort: Tachypnea and accessory muscle usage present. No respiratory distress.     Breath sounds: Decreased air movement present. Examination of the right-upper field reveals decreased breath sounds. Examination of the left-upper field reveals decreased breath sounds. Examination of the right-middle field reveals decreased breath sounds. Examination of the left-middle field reveals decreased breath sounds. Examination of the right-lower field reveals decreased breath sounds. Examination of the left-lower field reveals decreased breath sounds. Decreased breath sounds present. No wheezing.  Neurological:     Mental Status: She is alert and oriented to person, place, and time.  Psychiatric:        Mood and Affect: Mood normal.        Behavior: Behavior normal.       Assessment/Plan: 1. Decreased breath sounds of both lungs Chest xray ordered to evaluate for  pneumonia. If negative, dx is most likely acute bronchitis.  - DG Chest 2 View; Future  2. Advanced COPD [place] Chest xray ordered - DG Chest 2 View; Future  3. SOB (shortness of breath) Chest xray ordered - DG Chest 2 View; Future  4. Generalized anxiety disorder Refill of lorazepam, also prescribed hydroxyzine to help improve anxiety in-between ativan doses.  - hydrOXYzine (ATARAX) 10 MG tablet; Take 1 tablet (10 mg total) by mouth 3 (three) times daily as needed for anxiety.  Dispense: 90 tablet; Refill: 2 - LORazepam (ATIVAN) 0.5 MG tablet; TAKE 1/2 TO 1 TABLET BY MOUTH EVERY 8 HOURS AS NEEDED FOR  ANXIETY OR SLEEP  Dispense: 90 tablet; Refill: 0  5. Need for vaccination - Zoster Vaccine Adjuvanted [facility]) injection; Inject 0.5 mLs into the muscle once for 1 dose.  Dispense: 0.5 mL; Refill: 0   General Counseling: Christina Hester verbalizes understanding of the findings of todays visit and agrees with plan of treatment. I have discussed any further diagnostic evaluation that may be needed or ordered today. We also reviewed her medications today. she has been encouraged to call the office with any questions or concerns that should arise related to todays visit.    Counseling:    Orders Placed This Encounter  Procedures   DG Chest 2 View    Meds ordered this encounter  Medications   Zoster Vaccine Adjuvanted [facility]) injection    Sig: Inject 0.5 mLs into the muscle once for 1 dose.    Dispense:  0.5 mL    Refill:  0   hydrOXYzine (ATARAX) 10 MG tablet    Sig: Take 1 tablet (10 mg total) by mouth 3 (three) times daily as needed for anxiety.    Dispense:  90 tablet    Refill:  2    Please fill today   DISCONTD: LORazepam (ATIVAN) 0.5 MG tablet    Sig: TAKE 1/2 TO 1 TABLET BY MOUTH EVERY 8 HOURS AS NEEDED FOR ANXIETY OR SLEEP    Dispense:  90 tablet    Refill:  0   LORazepam (ATIVAN) 0.5 MG tablet    Sig: TAKE 1/2 TO 1 TABLET BY MOUTH EVERY 8 HOURS AS NEEDED FOR ANXIETY OR  SLEEP    Dispense:  90 tablet    Refill:  0    Return in about 4 weeks (around 10/02/2022) for F/U, Tali Coster PCP anxiety and SOB.  [place] Controlled Substance Database was reviewed by me for overdose risk score (ORS)  Time spent:30 Minutes Time spent with patient included reviewing progress notes, labs, imaging studies, and discussing plan for follow up.   This patient was seen by Jonetta Osgood, FNP-C in collaboration with Dr. Clayborn Bigness as a part of collaborative care agreement.  Altariq Goodall R. Valetta Fuller, MSN, FNP-C Internal Medicine

## 2022-09-05 ENCOUNTER — Encounter: Payer: Self-pay | Admitting: Neurosurgery

## 2022-09-05 ENCOUNTER — Telehealth: Payer: Self-pay

## 2022-09-05 ENCOUNTER — Ambulatory Visit (INDEPENDENT_AMBULATORY_CARE_PROVIDER_SITE_OTHER): Payer: Medicare Other | Admitting: Neurosurgery

## 2022-09-05 VITALS — BP 132/80 | Ht 59.0 in | Wt <= 1120 oz

## 2022-09-05 DIAGNOSIS — S32020D Wedge compression fracture of second lumbar vertebra, subsequent encounter for fracture with routine healing: Secondary | ICD-10-CM

## 2022-09-05 DIAGNOSIS — S32010D Wedge compression fracture of first lumbar vertebra, subsequent encounter for fracture with routine healing: Secondary | ICD-10-CM | POA: Diagnosis not present

## 2022-09-05 DIAGNOSIS — S32020A Wedge compression fracture of second lumbar vertebra, initial encounter for closed fracture: Secondary | ICD-10-CM

## 2022-09-05 DIAGNOSIS — S32010A Wedge compression fracture of first lumbar vertebra, initial encounter for closed fracture: Secondary | ICD-10-CM

## 2022-09-05 NOTE — Progress Notes (Signed)
Referring Physician:  Jonetta Osgood, NP [place],  [place] [id number]  Primary Physician:  Jonetta Osgood, NP  History of Present Illness: 09/05/22 Christina Hester is a 82 y.o ending today for fracture follow-up.  She does continue to have some discomfort intermittently and swelling in her back but denies any worsening since her last visit.  She is currently using ice, heat, and Ativan which do help.  She has not taken any of the muscle relaxer prescribed at her last visit as she is concerned about mixing this with her Ativan.  07/20/22 Christina Hester continues to have pain later in the day.  In the mornings she feels relatively well.  06/16/2022 Christina Hester is here today with a chief complaint of back pain.  She was seen in the emergency department on November 25 with back pain.  She was diagnosed with an L1 and L2 minor compression fractures.  She has still had some back pain since that time, but no other neurologic symptoms.  She has chronic obstructive pulmonary disease at baseline.  She is taking half the prescribed dose of medication as she is very slight in stature.  I have utilized the care everywhere function in epic to review the outside records available from external health systems.  Review of Systems:  A 10 point review of systems is negative, except for the pertinent positives and negatives detailed in the HPI.  Past Medical History: Past Medical History:  Diagnosis Date   AAA (abdominal aortic aneurysm) ([place])    a. 05/2016 Abd U/S: 3.02 x 3.02 AAA.   Anxiety    CAD (coronary artery disease)    a. 02/2017 NSTEMI/Cath: LM nl, LAD mild dzs, D2 min irregs, LCX mild dzs, OM2 50ost, RCA 95p, 146m L-->R collats, EF 55-65%.   COPD (chronic obstructive pulmonary disease) ([facility])    Diastolic dysfunction    a. 02/2017 Echo: EF 55-60%, gr1 DD, ? inf HK, mild MR.   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 05/2016 ABI's: R 1.22, L 1.11.   Tobacco abuse     Past Surgical History: Past Surgical History:  Procedure Laterality Date   ABDOMINAL HYSTERECTOMY     APPENDECTOMY     BREAST EXCISIONAL BIOPSY Left 80's or 90's   NEG   CARDIAC CATHETERIZATION     COLONOSCOPY N/A 05/22/2018   Procedure: COLONOSCOPY;  Surgeon: VLin Landsman MD;  Location: [facility] ENDOSCOPY;  Service: Gastroenterology;  Laterality: N/A;   COLONOSCOPY WITH PROPOFOL N/A 07/19/2020   Procedure: COLONOSCOPY WITH PROPOFOL;  Surgeon: Toledo, TBenay Pike MD;  Location: [facility] ENDOSCOPY;  Service: Gastroenterology;  Laterality: N/A;   ENDARTERECTOMY Right 11/06/2019   Procedure: ENDARTERECTOMY CAROTID;  Surgeon: DAlgernon Huxley MD;  Location: [facility] ORS;  Service: Vascular;  Laterality: Right;   LEFT HEART CATH Bilateral 03/05/2017   Procedure: Left Heart Cath with poss PCI;  Surgeon: AWellington Hampshire MD;  [facility];  Service: Cardiovascular;  Laterality: Bilateral;    Allergies: Allergies as of 09/05/2022 - Review Complete 09/05/2022  Allergen Reaction Noted   Hydrocodone-acetaminophen Other (See Comments) 07/20/2022   Aspirin Tinitus 11/25/2015   Budesonide-formoterol fumarate Other (See Comments) 05/09/2016   Prednisone Other (See Comments) 03/02/2017   Sulfa antibiotics Nausea And Vomiting  11/25/2015   Tramadol Other (See Comments) 02/09/2021    Medications:  Social History: Social History   Tobacco Use   Smoking status: Former    Packs/day: 0.25    Types: Cigarettes    Quit date: 07/12/2017    Years since quitting: 5.1   Smokeless tobacco: Never   Tobacco comments:    currently 90 days without smoking  Vaping Use   Vaping Use: Never used  Substance Use Topics   Alcohol use: No   Drug use: No    Family Medical History: Family History  Problem Relation Age of Onset   Breast cancer Mother     Dementia Mother    Hypertension Mother    Breast cancer Maternal Aunt    Breast cancer Maternal Aunt    Breast cancer Maternal Aunt    CVA Father     Physical Examination: Vitals:   09/05/22 1532  BP: 132/80     MAEW.  Ambulates well without assistance   Medical Decision Making  Imaging: 09/04/22 lumbar xrays do not show any dynamic movement or segmental kyphosis   CT AP 06/10/2022 IMPRESSION: 1. Limited bowel detail in this patient without oral contrast. No obvious bowel obstruction or inflammation.   2. Osteopenia with age indeterminate L1 and L2 compression, new since February. No retropulsion or complicating features. If specific therapy such as vertebroplasty is desired, Lumbar MRI or Nuclear Medicine Whole-body Bone Scan would best determine acuity.   3. Progressed since 2019 Infrarenal Abdominal Aortic Aneurysm, now up to 37 mm. Recommend follow-up every 2 years. Reference: J Am Coll Radiol [id number]. Aortic aneurysm NOS (ICD10-I71.9). Aortic Atherosclerosis (ICD10-I70.0).   4. 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   5. Otherwise stable CT appearance of the abdomen and pelvis since 2019.     Electronically Signed   By: Genevie Ann M.D.   On: 06/10/2022 04:21   Lumbar spine x-rays on July 20, 2022 show no evidence of worsening fracture.  I have personally reviewed the images and agree with the above interpretation.  Assessment and Plan: Christina Hester is a pleasant 82 y.o. female with L1 and L2 compression fractures.  She does continue to have some back pain however it is largely manageable and has not progressed.  We discussed that she should continue to improve given time.  If she continues to have muscle spasms in her back, we can discuss referring her for trigger point injections.  She was encouraged to call us if she have any new or worsening symptoms.  We will otherwise see her going  forward on an as-needed basis.  She expressed understanding was in agreement with this plan.  Thank you for involving me in the care of this patient.   I spent a total of 10 minutes in both face-to-face and non-face-to-face activities for this visit on the date of this encounter review of records, review of imaging, discussion of symptoms, physical exam, and documentation.   Cooper Render PA-C Neurosurgery

## 2022-09-05 NOTE — Telephone Encounter (Signed)
Faxed pres to [facility] for Duoneb

## 2022-09-05 NOTE — Progress Notes (Signed)
Negative for pneumonia or other infection.

## 2022-09-10 ENCOUNTER — Encounter: Payer: Self-pay | Admitting: Nurse Practitioner

## 2022-09-11 ENCOUNTER — Other Ambulatory Visit (INDEPENDENT_AMBULATORY_CARE_PROVIDER_SITE_OTHER): Payer: Self-pay | Admitting: Vascular Surgery

## 2022-09-11 DIAGNOSIS — I739 Peripheral vascular disease, unspecified: Secondary | ICD-10-CM

## 2022-09-11 DIAGNOSIS — I7143 Infrarenal abdominal aortic aneurysm, without rupture: Secondary | ICD-10-CM

## 2022-09-11 DIAGNOSIS — I6523 Occlusion and stenosis of bilateral carotid arteries: Secondary | ICD-10-CM

## 2022-09-12 ENCOUNTER — Ambulatory Visit (INDEPENDENT_AMBULATORY_CARE_PROVIDER_SITE_OTHER): Payer: Medicare Other

## 2022-09-12 ENCOUNTER — Encounter (INDEPENDENT_AMBULATORY_CARE_PROVIDER_SITE_OTHER): Payer: Self-pay | Admitting: Vascular Surgery

## 2022-09-12 ENCOUNTER — Ambulatory Visit (INDEPENDENT_AMBULATORY_CARE_PROVIDER_SITE_OTHER): Payer: Medicare Other | Admitting: Vascular Surgery

## 2022-09-12 VITALS — BP 142/77 | HR 83 | Resp 15 | Wt <= 1120 oz

## 2022-09-12 DIAGNOSIS — I2583 Coronary atherosclerosis due to lipid rich plaque: Secondary | ICD-10-CM | POA: Diagnosis not present

## 2022-09-12 DIAGNOSIS — I6523 Occlusion and stenosis of bilateral carotid arteries: Secondary | ICD-10-CM | POA: Diagnosis not present

## 2022-09-12 DIAGNOSIS — I7143 Infrarenal abdominal aortic aneurysm, without rupture: Secondary | ICD-10-CM

## 2022-09-12 DIAGNOSIS — I739 Peripheral vascular disease, unspecified: Secondary | ICD-10-CM

## 2022-09-12 DIAGNOSIS — I2510 Atherosclerotic heart disease of native coronary artery without angina pectoris: Secondary | ICD-10-CM | POA: Diagnosis not present

## 2022-09-12 DIAGNOSIS — I10 Essential (primary) hypertension: Secondary | ICD-10-CM

## 2022-09-12 DIAGNOSIS — I200 Unstable angina: Secondary | ICD-10-CM | POA: Diagnosis not present

## 2022-09-12 NOTE — Assessment & Plan Note (Signed)
Duplex today shows stable 3.6 cm AAA.  This was 3.5 cm one year ago.  No role for intervention.  Recheck in one year.

## 2022-09-12 NOTE — Assessment & Plan Note (Signed)
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  No role for intervention.  Continue current medications.  Recheck in one year.

## 2022-09-12 NOTE — Progress Notes (Signed)
MRN : [id number]  Christina Hester is a 82 y.o. ([birth year]) female who presents with chief complaint of  Chief Complaint  Patient presents with   Follow-up    Ultrasound follow up  .  History of Present Illness: Patient returns today in follow up of multiple vascular issues.  She had a compression fracture in her back and that has been very difficult for her.  Otherwise she is doing well.  No focal neurologic symptoms.  Specifically, the patient denies amaurosis fugax, speech or swallowing difficulties, or arm or leg weakness or numbness.  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  We also follow her for AAA and PAD.  No aneurysm related symptoms. Specifically, the patient denies new back or abdominal pain, or signs of peripheral embolization. Duplex today shows stable 3.6 cm AAA.  This was 3.5 cm one year ago. Her ABIs today were 1.01 on the right and 0.96 on the left.    Current Outpatient Medications  Medication Sig Dispense Refill   amLODipine (NORVASC) 5 MG tablet Take 5 mg by mouth daily.     azithromycin (ZITHROMAX) 250 MG tablet Take 1 tablet by mouth every Monday, Wednesday, and Friday.     baclofen (LIORESAL) 10 MG tablet Take 0.5 tablets (5 mg total) by mouth 3 (three) times daily as needed for muscle spasms. 90 tablet 1   benzonatate (TESSALON) 200 MG capsule Take 200 mg by mouth as needed for cough.     bisoprolol (ZEBETA) 5 MG tablet Take 1 tablet (5 mg total) by mouth daily. 90 tablet 3   buPROPion (WELLBUTRIN) 100 MG tablet Take 1 tablet (100 mg total) by mouth daily. 90 tablet 3   clopidogrel (PLAVIX) 75 MG tablet Take 1 tablet (75 mg total) by mouth daily. 90 tablet 3   clotrimazole (MYCELEX) 10 MG troche Take 1 tablet (10 mg total) by mouth 5 (five) times daily. 70 Troche 2   clotrimazole (MYCELEX) 10 MG troche Take 10 mg by mouth as needed (thrush).     hydrOXYzine (ATARAX) 10 MG tablet Take 1 tablet (10 mg total) by mouth 3  (three) times daily as needed for anxiety. 90 tablet 2   ipratropium-albuterol (DUONEB) 0.5-2.5 (3) MG/3ML SOLN USE 1 VIAL IN NEBULIZER EVERY 4 HOURS 120 mL 1   LORazepam (ATIVAN) 0.5 MG tablet TAKE 1/2 TO 1 TABLET BY MOUTH EVERY 8 HOURS AS NEEDED FOR ANXIETY OR SLEEP 90 tablet 0   meloxicam (MOBIC) 15 MG tablet Take 1 tablet (15 mg total) by mouth daily. 30 tablet 2   nitroGLYCERIN (NITROSTAT) 0.4 MG SL tablet Place 1 tablet (0.4 mg total) under the tongue every 5 (five) minutes as needed for chest pain. 20 tablet 12   nystatin (MYCOSTATIN) 100000 UNIT/ML suspension Take 5 mLs (500,000 Units total) by mouth 4 (four) times daily. (Patient taking differently: Take 5 mLs by mouth as needed (thrush).) 200 mL 0   ondansetron (ZOFRAN) 4 MG tablet Take 1 tablet (4 mg total) by mouth every 8 (eight) hours as needed for nausea or vomiting. 20 tablet 0   pantoprazole (PROTONIX) 40 MG tablet Take 1 tablet (40 mg total) by mouth daily. Please keep scheduled appointment 30 tablet 0   predniSONE (DELTASONE) 1 MG tablet Take 3 mg by mouth daily with breakfast.     VENTOLIN HFA 108 (90 Base) MCG/ACT inhaler INHALE 2 PUFFS INTO THE LUNGS EVERY 6 HOURS AS NEEDED FOR WHEEZING OR  SHORTNESS OF BREATH. 18 g 3   No current facility-administered medications for this visit.    Past Medical History:  Diagnosis Date   AAA (abdominal aortic aneurysm) ([place])    a. 05/2016 Abd U/S: 3.02 x 3.02 AAA.   Anxiety    CAD (coronary artery disease)    a. 02/2017 NSTEMI/Cath: LM nl, LAD mild dzs, D2 min irregs, LCX mild dzs, OM2 50ost, RCA 95p, 166m L-->R collats, EF 55-65%.   COPD (chronic obstructive pulmonary disease) (HCC)    Diastolic dysfunction    a. 02/2017 Echo: EF 55-60%, gr1 DD, ? inf HK, mild MR.   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 05/2016 ABI's: R 1.22, L 1.11.    Tobacco abuse     Past Surgical History:  Procedure Laterality Date   ABDOMINAL HYSTERECTOMY     APPENDECTOMY     BREAST EXCISIONAL BIOPSY Left 80's or 90's   NEG   CARDIAC CATHETERIZATION     COLONOSCOPY N/A 05/22/2018   Procedure: COLONOSCOPY;  Surgeon: VLin Landsman MD;  Location: [facility];  Service: Gastroenterology;  Laterality: N/A;   COLONOSCOPY WITH PROPOFOL N/A 07/19/2020   Procedure: COLONOSCOPY WITH PROPOFOL;  Surgeon: Toledo, TBenay Pike MD;  Location: [facility];  Service: Gastroenterology;  Laterality: N/A;   ENDARTERECTOMY Right 11/06/2019   Procedure: ENDARTERECTOMY CAROTID;  Surgeon: DAlgernon Huxley MD;  Location: [facility];  Service: Vascular;  Laterality: Right;   LEFT HEART CATH Bilateral 03/05/2017   Procedure: Left Heart Cath with poss PCI;  Surgeon: AWellington Hampshire MD;  [facility];  Service: Cardiovascular;  Laterality: Bilateral;     Social History   Tobacco Use   Smoking status: Former    Packs/day: 0.25    Types: Cigarettes    Quit date: 07/12/2017    Years since quitting: 5.1   Smokeless tobacco: Never   Tobacco comments:    currently 90 days without smoking  Vaping Use   Vaping Use: Never used  Substance Use Topics   Alcohol use: No   Drug use: No       Family History  Problem Relation Age of Onset   Breast cancer Mother    Dementia Mother    Hypertension Mother    Breast cancer Maternal Aunt    Breast cancer Maternal Aunt    Breast cancer Maternal Aunt    CVA Father      Allergies  Allergen Reactions   Hydrocodone-Acetaminophen Other (See Comments)    Facial numbness   Aspirin Tinitus   Budesonide-Formoterol Fumarate Other (See Comments)    Patient felt "out of her mind" and angry. Patient felt "out of her mind" and angry.   Prednisone Other (See Comments)    Makes violent   Sulfa Antibiotics Nausea And Vomiting   Tramadol Other (See Comments)    REVIEW OF SYSTEMS (Negative unless checked)    Constitutional: '[]'$ Weight loss  '[]'$ Fever  '[]'$ Chills Cardiac: '[]'$ Chest pain   '[]'$ Chest pressure   '[]'$ Palpitations   '[]'$ Shortness of breath when laying flat   '[]'$ Shortness of breath at rest   '[]'$ Shortness of breath with exertion. Vascular:  '[]'$ Pain in legs with walking   '[]'$ Pain in legs at rest   '[]'$ Pain in legs when laying flat   '[]'$ Claudication   '[]'$   Pain in feet when walking  '[]'$ Pain in feet at rest  '[]'$ Pain in feet when laying flat   '[]'$ History of DVT   '[]'$ Phlebitis   '[]'$ Swelling in legs   '[]'$ Varicose veins   '[]'$ Non-healing ulcers Pulmonary:   '[]'$ Uses home oxygen   '[]'$ Productive cough   '[]'$ Hemoptysis   '[]'$ Wheeze  '[x]'$ COPD   '[]'$ Asthma Neurologic:  '[]'$ Dizziness  '[]'$ Blackouts   '[]'$ Seizures   '[]'$ History of stroke   '[]'$ History of TIA  '[]'$ Aphasia   '[]'$ Temporary blindness   '[]'$ Dysphagia   '[]'$ Weakness or numbness in arms   '[]'$ Weakness or numbness in legs Musculoskeletal:  '[x]'$ Arthritis   '[]'$ Joint swelling   '[x]'$ Joint pain   '[x]'$ Low back pain Hematologic:  '[]'$ Easy bruising  '[]'$ Easy bleeding   '[]'$ Hypercoagulable state   '[]'$ Anemic   Gastrointestinal:  '[]'$ Blood in stool   '[]'$ Vomiting blood  '[]'$ Gastroesophageal reflux/heartburn   '[]'$ Abdominal pain Genitourinary:  '[]'$ Chronic kidney disease   '[]'$ Difficult urination  '[]'$ Frequent urination  '[]'$ Burning with urination   '[]'$ Hematuria Skin:  '[]'$ Rashes   '[]'$ Ulcers   '[]'$ Wounds Psychological:  '[x]'$ History of anxiety   '[]'$  History of major depression.  Physical Examination  BP (!) 142/77 (BP Location: Left Arm)   Pulse 83   Resp 15   Wt 69 lb 6.4 oz (31.5 kg)   BMI 14.02 kg/m  Gen:   NAD. Thin and frail appearing. Head: [place]/AT, +  temporalis wasting. Ear/Nose/Throat: Hearing grossly intact, nares w/o erythema or drainage Eyes: Conjunctiva clear. Sclera non-icteric Neck: Supple.  Trachea midline Pulmonary:  Good air movement, no use of accessory muscles.  Cardiac: RRR, no JVD Vascular:  Vessel Right Left  Radial Palpable Palpable                          PT Palpable Palpable  DP Palpable Palpable     Musculoskeletal: M/S 5/5 throughout.  No deformity or atrophy. No edema. Neurologic: Sensation grossly intact in extremities.  Symmetrical.  Speech is fluent.  Psychiatric: Judgment intact, Mood & affect appropriate for pt's clinical situation. Dermatologic: No rashes or ulcers noted.  No cellulitis or open wounds.      Labs No results found for this or any previous visit (from the past 2160 hour(s)).  Radiology DG Lumbar Spine Complete  Result Date: 09/06/2022 CLINICAL DATA:  L1 and L2 fracture. Closed compression fracture of body of L1 vertebra. EXAM: LUMBAR SPINE - COMPLETE 4+ VIEW COMPARISON:  Lumbar radiograph 07/18/2022 FINDINGS: Imaging obtained standing. There are 5 non-rib-bearing lumbar vertebra. The bones are subjectively under mineralized. Stable appearance of mild L1 and L2 inferior endplate compression deformities. No new fracture. Similar dextroscoliotic curvature centered at the thoracolumbar junction. No listhesis. No abnormal motion on flexion or extension. Mild diffuse degenerative disc disease and facet hypertrophy. IMPRESSION: Stable appearance of mild L1 and L2 compression deformities. Electronically Signed   By: Keith Rake M.D.   On: 09/06/2022 14:20   DG Chest 2 View  Result Date: 09/04/2022 CLINICAL DATA:  Provided history: Decreased breath sounds and shortness of breath. History of COPD/emphysema. Former smoker. EXAM: CHEST - 2 VIEW COMPARISON:  Prior chest radiographs 08/18/2021 and earlier. FINDINGS: Heart size within normal limits. Aortic atherosclerosis. Emphysema with hyperinflation. No appreciable airspace consolidation. Right apical pleuroparenchymal scarring. No evidence of pleural effusion or pneumothorax. No acute osseous abnormality identified. Dextrocurvature of the lower thoracic and visualized upper lumbar spine. IMPRESSION: No evidence of acute cardiopulmonary abnormality. Aortic Atherosclerosis (ICD10-I70.0) and Emphysema (ICD10-J43.9).  Electronically Signed   By: Marylyn Ishihara  Armandina Gemma D.O.   On: 09/04/2022 13:08    Assessment/Plan Benign hypertension blood pressure control important in reducing the progression of atherosclerotic disease. On appropriate oral medications.   Coronary artery disease Has been seen by cardiology.  No issues with surgery earlier this year.  Carotid stenosis Duplex shows a patent right carotid endarterectomy and stable 40-59% left ICA stenosis.  No role for intervention.  Continue current medications.  Recheck in one year.    AAA (abdominal aortic aneurysm) without rupture (HCC) Duplex today shows stable 3.6 cm AAA.  This was 3.5 cm one year ago.  No role for intervention.  Recheck in one year.    PVD (peripheral vascular disease) (HCC) Her ABIs today were 1.01 on the right and 0.96 on the left. No role for intervention. Recheck in one year.     Leotis Pain, MD  09/12/2022 10:30 AM    This note was created with Dragon medical transcription system.  Any errors from dictation are purely unintentional

## 2022-09-12 NOTE — Assessment & Plan Note (Signed)
Her ABIs today were 1.01 on the right and 0.96 on the left. No role for intervention. Recheck in one year.

## 2022-09-13 LAB — VAS US ABI WITH/WO TBI
Left ABI: 0.96
Right ABI: 1.01

## 2022-09-29 ENCOUNTER — Other Ambulatory Visit: Payer: Self-pay | Admitting: Cardiovascular Disease

## 2022-10-02 ENCOUNTER — Ambulatory Visit (INDEPENDENT_AMBULATORY_CARE_PROVIDER_SITE_OTHER): Payer: Medicare Other | Admitting: Nurse Practitioner

## 2022-10-02 ENCOUNTER — Encounter: Payer: Self-pay | Admitting: Nurse Practitioner

## 2022-10-02 VITALS — BP 120/72 | HR 79 | Temp 97.1°F | Resp 16 | Ht 59.0 in | Wt 71.0 lb

## 2022-10-02 DIAGNOSIS — J449 Chronic obstructive pulmonary disease, unspecified: Secondary | ICD-10-CM | POA: Diagnosis not present

## 2022-10-02 DIAGNOSIS — R0602 Shortness of breath: Secondary | ICD-10-CM | POA: Diagnosis not present

## 2022-10-02 DIAGNOSIS — F411 Generalized anxiety disorder: Secondary | ICD-10-CM | POA: Diagnosis not present

## 2022-10-02 MED ORDER — LORAZEPAM 0.5 MG PO TABS: ORAL_TABLET | ORAL | 2 refills | Status: DC

## 2022-10-02 NOTE — Progress Notes (Signed)
[facility] [place], [place] [address]  Internal MEDICINE  Office Visit Note  Patient Name: Christina Hester  [id number]  [id number]  Date of Service: 10/02/2022  Chief Complaint  Patient presents with   Follow-up   Hypertension   Hyperlipidemia    HPI [name] presents for a follow-up visit for anxiety, back pain and COPD Feeling better -- anxiety is improved Back pain has improved Breathing feels worse, SOB with humid weather.  6 min walk done was at 96% and ended at 95%.  Talked about trying a maintenance inhaler but has previously tried trelegy and breztri and also yupelri and states that they don't help much and are too expensive.    Current Medication: Outpatient Encounter Medications as of 10/02/2022  Medication Sig   amLODipine (NORVASC) 5 MG tablet Take 5 mg by mouth daily.   azithromycin (ZITHROMAX) 250 MG tablet Take 1 tablet by mouth every Monday, Wednesday, and Friday.   baclofen (LIORESAL) 10 MG tablet Take 0.5 tablets (5 mg total) by mouth 3 (three) times daily as needed for muscle spasms.   benzonatate (TESSALON) 200 MG capsule Take 200 mg by mouth as needed for cough.   bisoprolol (ZEBETA) 5 MG tablet Take 1 tablet (5 mg total) by mouth daily.   buPROPion (WELLBUTRIN) 100 MG tablet Take 1 tablet (100 mg total) by mouth daily.   clopidogrel (PLAVIX) 75 MG tablet Take 1 tablet (75 mg total) by mouth daily.   clotrimazole (MYCELEX) 10 MG troche Take 1 tablet (10 mg total) by mouth 5 (five) times daily.   clotrimazole (MYCELEX) 10 MG troche Take 10 mg by mouth as needed (thrush).   hydrOXYzine (ATARAX) 10 MG tablet Take 1 tablet (10 mg total) by mouth 3 (three) times daily as needed for anxiety.   ipratropium-albuterol (DUONEB) 0.5-2.5 (3) MG/3ML SOLN USE 1 VIAL IN NEBULIZER EVERY 4 HOURS   meloxicam (MOBIC) 15 MG tablet Take 1 tablet (15 mg total) by mouth daily.   nitroGLYCERIN (NITROSTAT) 0.4 MG SL tablet Place 1 tablet (0.4 mg total)  under the tongue every 5 (five) minutes as needed for chest pain.   nystatin (MYCOSTATIN) 100000 UNIT/ML suspension Take 5 mLs (500,000 Units total) by mouth 4 (four) times daily. (Patient taking differently: Take 5 mLs by mouth as needed (thrush).)   ondansetron (ZOFRAN) 4 MG tablet Take 1 tablet (4 mg total) by mouth every 8 (eight) hours as needed for nausea or vomiting.   pantoprazole (PROTONIX) 40 MG tablet Take 1 tablet (40 mg total) by mouth daily.   predniSONE (DELTASONE) 1 MG tablet Take 3 mg by mouth daily with breakfast.   VENTOLIN HFA 108 (90 Base) MCG/ACT inhaler INHALE 2 PUFFS INTO THE LUNGS EVERY 6 HOURS AS NEEDED FOR WHEEZING OR SHORTNESS OF BREATH.   [DISCONTINUED] LORazepam (ATIVAN) 0.5 MG tablet TAKE 1/2 TO 1 TABLET BY MOUTH EVERY 8 HOURS AS NEEDED FOR ANXIETY OR SLEEP   LORazepam (ATIVAN) 0.5 MG tablet TAKE 1/2 TO 1 TABLET BY MOUTH EVERY 8 HOURS AS NEEDED FOR ANXIETY OR SLEEP   No facility-administered encounter medications on file as of 10/02/2022.    Surgical History: Past Surgical History:  Procedure Laterality Date   ABDOMINAL HYSTERECTOMY     APPENDECTOMY     BREAST EXCISIONAL BIOPSY Left 80's or 90's   NEG   CARDIAC CATHETERIZATION     COLONOSCOPY N/A 05/22/2018   Procedure: COLONOSCOPY;  Surgeon: Lin Landsman, MD;  Location: [facility];  Service: Gastroenterology;  Laterality: N/A;   COLONOSCOPY WITH PROPOFOL N/A 07/19/2020   Procedure: COLONOSCOPY WITH PROPOFOL;  Surgeon: Toledo, Benay Pike, MD;  Location: [facility];  Service: Gastroenterology;  Laterality: N/A;   ENDARTERECTOMY Right 11/06/2019   Procedure: ENDARTERECTOMY CAROTID;  Surgeon: Algernon Huxley, MD;  Location: [facility];  Service: Vascular;  Laterality: Right;   LEFT HEART CATH Bilateral 03/05/2017   Procedure: Left Heart Cath with poss PCI;  Surgeon: Wellington Hampshire, MD;  Location: [facility];  Service: Cardiovascular;  Laterality: Bilateral;    Medical History: Past Medical  History:  Diagnosis Date   AAA (abdominal aortic aneurysm) ([place])    a. 05/2016 Abd U/S: 3.02 x 3.02 AAA.   Anxiety    CAD (coronary artery disease)    a. 02/2017 NSTEMI/Cath: LM nl, LAD mild dzs, D2 min irregs, LCX mild dzs, OM2 50ost, RCA 95p, 165m, L-->R collats, EF 55-65%.   COPD (chronic obstructive pulmonary disease) (HCC)    Diastolic dysfunction    a. 02/2017 Echo: EF 55-60%, gr1 DD, ? inf HK, mild MR.   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 05/2016 ABI's: R 1.22, L 1.11.   Tobacco abuse     Family History: Family History  Problem Relation Age of Onset   Breast cancer Mother    Dementia Mother    Hypertension Mother    Breast cancer Maternal Aunt    Breast cancer Maternal Aunt    Breast cancer Maternal Aunt    CVA Father     Social History   Socioeconomic History   Marital status: Divorced    Spouse name: Not on file   Number of children: Not on file   Years of education: Not on file   Highest education level: Not on file  Occupational History   Not on file  Tobacco Use   Smoking status: Former    Packs/day: .25    Types: Cigarettes    Quit date: 07/12/2017    Years since quitting: 5.2   Smokeless tobacco: Never   Tobacco comments:    currently 90 days without smoking  Vaping Use   Vaping Use: Never used  Substance and Sexual Activity   Alcohol use: No   Drug use: No   Sexual activity: Not on file  Other Topics Concern   Not on file  Social History Narrative   Not on file   Social Determinants of Health   Financial Resource Strain: Not on file  Food Insecurity: Not on file  Transportation Needs: Not on file  Physical Activity: Not on file  Stress: Not on file  Social Connections: Not on file  Intimate Partner Violence: Not on file      Review of Systems  Constitutional:  Positive for fatigue.   HENT: Negative.    Respiratory:  Positive for cough and shortness of breath. Negative for chest tightness and wheezing.        Intermittent symptoms due to COPD  Cardiovascular: Negative.  Negative for chest pain and palpitations.  Gastrointestinal: Negative.  Negative for abdominal pain.  Neurological:  Negative for headaches.  Hematological:  Negative for adenopathy. Does not bruise/bleed easily.    Vital Signs: BP 120/72   Pulse 79   Temp (!) 97.1 F (36.2  C)   Resp 16   Ht 4\' 11"  (1.499 m)   Wt 71 lb (32.2 kg)   SpO2 96%   BMI 14.34 kg/m    Physical Exam Vitals reviewed.  Constitutional:      General: She is not in acute distress.    Appearance: Normal appearance. She is not ill-appearing.  HENT:     Head: Normocephalic and atraumatic.  Eyes:     Pupils: Pupils are equal, round, and reactive to light.  Cardiovascular:     Rate and Rhythm: Normal rate and regular rhythm.  Pulmonary:     Effort: Pulmonary effort is normal. No respiratory distress.  Neurological:     Mental Status: She is alert and oriented to person, place, and time.  Psychiatric:        Mood and Affect: Mood normal.        Behavior: Behavior normal.        Assessment/Plan: 1. SOB (shortness of breath) 6 minute walk done. Does not qualify for continuous oxygen but has a stationary unit for night time use only.  - 6 minute walk  2. Advanced COPD ([place]) Does not qualify for continuous oxygen but can use it at night still.  - 6 minute walk  3. Generalized anxiety disorder Lorazepam refills ordered - LORazepam (ATIVAN) 0.5 MG tablet; TAKE 1/2 TO 1 TABLET BY MOUTH EVERY 8 HOURS AS NEEDED FOR ANXIETY OR SLEEP  Dispense: 60 tablet; Refill: 2   General Counseling: Erisha verbalizes understanding of the findings of todays visit and agrees with plan of treatment. I have discussed any further diagnostic evaluation that may be needed or ordered today. We also reviewed her medications today. she has  been encouraged to call the office with any questions or concerns that should arise related to todays visit.    Orders Placed This Encounter  Procedures   6 minute walk    Meds ordered this encounter  Medications   LORazepam (ATIVAN) 0.5 MG tablet    Sig: TAKE 1/2 TO 1 TABLET BY MOUTH EVERY 8 HOURS AS NEEDED FOR ANXIETY OR SLEEP    Dispense:  60 tablet    Refill:  2    Return in about 3 months (around 12/26/2022) for F/U, anxiety med refill, Galen Malkowski PCP.   Total time spent:30 Minutes Time spent includes review of chart, medications, test results, and follow up plan with the patient.   [place] Controlled Substance Database was reviewed by me.  This patient was seen by Jonetta Osgood, FNP-C in collaboration with Dr. Clayborn Bigness as a part of collaborative care agreement.   Alycea Segoviano R. Valetta Fuller, MSN, FNP-C Internal medicine

## 2022-10-19 ENCOUNTER — Ambulatory Visit: Payer: Medicare Other | Admitting: Nurse Practitioner

## 2022-10-19 DIAGNOSIS — M25511 Pain in right shoulder: Secondary | ICD-10-CM | POA: Diagnosis not present

## 2022-10-19 DIAGNOSIS — M25512 Pain in left shoulder: Secondary | ICD-10-CM | POA: Diagnosis not present

## 2022-10-19 DIAGNOSIS — M542 Cervicalgia: Secondary | ICD-10-CM | POA: Diagnosis not present

## 2022-10-24 DIAGNOSIS — M5030 Other cervical disc degeneration, unspecified cervical region: Secondary | ICD-10-CM | POA: Diagnosis not present

## 2022-10-26 DIAGNOSIS — M5030 Other cervical disc degeneration, unspecified cervical region: Secondary | ICD-10-CM | POA: Diagnosis not present

## 2022-10-31 DIAGNOSIS — M5030 Other cervical disc degeneration, unspecified cervical region: Secondary | ICD-10-CM | POA: Diagnosis not present

## 2022-11-03 DIAGNOSIS — M5030 Other cervical disc degeneration, unspecified cervical region: Secondary | ICD-10-CM | POA: Diagnosis not present

## 2022-11-06 DIAGNOSIS — M5030 Other cervical disc degeneration, unspecified cervical region: Secondary | ICD-10-CM | POA: Diagnosis not present

## 2022-11-09 DIAGNOSIS — F17210 Nicotine dependence, cigarettes, uncomplicated: Secondary | ICD-10-CM | POA: Diagnosis not present

## 2022-11-09 DIAGNOSIS — M818 Other osteoporosis without current pathological fracture: Secondary | ICD-10-CM | POA: Diagnosis not present

## 2022-11-14 DIAGNOSIS — M5030 Other cervical disc degeneration, unspecified cervical region: Secondary | ICD-10-CM | POA: Diagnosis not present

## 2022-11-16 DIAGNOSIS — M5030 Other cervical disc degeneration, unspecified cervical region: Secondary | ICD-10-CM | POA: Diagnosis not present

## 2022-11-20 DIAGNOSIS — M810 Age-related osteoporosis without current pathological fracture: Secondary | ICD-10-CM | POA: Diagnosis not present

## 2022-11-21 DIAGNOSIS — M810 Age-related osteoporosis without current pathological fracture: Secondary | ICD-10-CM | POA: Diagnosis not present

## 2022-11-25 ENCOUNTER — Inpatient Hospital Stay
Admission: EM | Admit: 2022-11-25 | Discharge: 2022-11-29 | DRG: 190 | Disposition: A | Discharge status: Home / Self Care | Payer: Medicare Other | Attending: Internal Medicine | Admitting: Internal Medicine

## 2022-11-25 ENCOUNTER — Emergency Department: Payer: Medicare Other

## 2022-11-25 ENCOUNTER — Other Ambulatory Visit: Payer: Self-pay

## 2022-11-25 DIAGNOSIS — I2510 Atherosclerotic heart disease of native coronary artery without angina pectoris: Secondary | ICD-10-CM | POA: Diagnosis not present

## 2022-11-25 DIAGNOSIS — R000 Tachycardia, unspecified: Secondary | ICD-10-CM | POA: Diagnosis not present

## 2022-11-25 DIAGNOSIS — R0602 Shortness of breath: Secondary | ICD-10-CM | POA: Diagnosis not present

## 2022-11-25 DIAGNOSIS — Z515 Encounter for palliative care: Secondary | ICD-10-CM

## 2022-11-25 DIAGNOSIS — B348 Other viral infections of unspecified site: Secondary | ICD-10-CM | POA: Diagnosis not present

## 2022-11-25 DIAGNOSIS — Z1152 Encounter for screening for COVID-19: Secondary | ICD-10-CM | POA: Diagnosis not present

## 2022-11-25 DIAGNOSIS — Z7952 Long term (current) use of systemic steroids: Secondary | ICD-10-CM | POA: Diagnosis not present

## 2022-11-25 DIAGNOSIS — I110 Hypertensive heart disease with heart failure: Secondary | ICD-10-CM | POA: Diagnosis present

## 2022-11-25 DIAGNOSIS — R54 Age-related physical debility: Secondary | ICD-10-CM | POA: Diagnosis not present

## 2022-11-25 DIAGNOSIS — F17210 Nicotine dependence, cigarettes, uncomplicated: Secondary | ICD-10-CM | POA: Diagnosis present

## 2022-11-25 DIAGNOSIS — Z823 Family history of stroke: Secondary | ICD-10-CM

## 2022-11-25 DIAGNOSIS — I5189 Other ill-defined heart diseases: Secondary | ICD-10-CM

## 2022-11-25 DIAGNOSIS — E871 Hypo-osmolality and hyponatremia: Secondary | ICD-10-CM

## 2022-11-25 DIAGNOSIS — Z85828 Personal history of other malignant neoplasm of skin: Secondary | ICD-10-CM

## 2022-11-25 DIAGNOSIS — I739 Peripheral vascular disease, unspecified: Secondary | ICD-10-CM

## 2022-11-25 DIAGNOSIS — I5032 Chronic diastolic (congestive) heart failure: Secondary | ICD-10-CM | POA: Diagnosis present

## 2022-11-25 DIAGNOSIS — I6529 Occlusion and stenosis of unspecified carotid artery: Secondary | ICD-10-CM | POA: Diagnosis not present

## 2022-11-25 DIAGNOSIS — Z888 Allergy status to other drugs, medicaments and biological substances status: Secondary | ICD-10-CM

## 2022-11-25 DIAGNOSIS — Z803 Family history of malignant neoplasm of breast: Secondary | ICD-10-CM

## 2022-11-25 DIAGNOSIS — F419 Anxiety disorder, unspecified: Secondary | ICD-10-CM | POA: Diagnosis present

## 2022-11-25 DIAGNOSIS — J439 Emphysema, unspecified: Secondary | ICD-10-CM | POA: Diagnosis present

## 2022-11-25 DIAGNOSIS — E785 Hyperlipidemia, unspecified: Secondary | ICD-10-CM | POA: Diagnosis present

## 2022-11-25 DIAGNOSIS — J9621 Acute and chronic respiratory failure with hypoxia: Secondary | ICD-10-CM

## 2022-11-25 DIAGNOSIS — Z79899 Other long term (current) drug therapy: Secondary | ICD-10-CM | POA: Diagnosis not present

## 2022-11-25 DIAGNOSIS — R0689 Other abnormalities of breathing: Secondary | ICD-10-CM | POA: Diagnosis not present

## 2022-11-25 DIAGNOSIS — J441 Chronic obstructive pulmonary disease with (acute) exacerbation: Secondary | ICD-10-CM | POA: Diagnosis not present

## 2022-11-25 DIAGNOSIS — Z9981 Dependence on supplemental oxygen: Secondary | ICD-10-CM

## 2022-11-25 DIAGNOSIS — I5030 Unspecified diastolic (congestive) heart failure: Secondary | ICD-10-CM | POA: Insufficient documentation

## 2022-11-25 DIAGNOSIS — I714 Abdominal aortic aneurysm, without rupture, unspecified: Secondary | ICD-10-CM | POA: Diagnosis present

## 2022-11-25 DIAGNOSIS — I6522 Occlusion and stenosis of left carotid artery: Secondary | ICD-10-CM | POA: Diagnosis present

## 2022-11-25 DIAGNOSIS — Z885 Allergy status to narcotic agent status: Secondary | ICD-10-CM

## 2022-11-25 DIAGNOSIS — Z886 Allergy status to analgesic agent status: Secondary | ICD-10-CM

## 2022-11-25 DIAGNOSIS — Z882 Allergy status to sulfonamides status: Secondary | ICD-10-CM

## 2022-11-25 DIAGNOSIS — R059 Cough, unspecified: Secondary | ICD-10-CM | POA: Diagnosis not present

## 2022-11-25 DIAGNOSIS — Z8249 Family history of ischemic heart disease and other diseases of the circulatory system: Secondary | ICD-10-CM | POA: Diagnosis not present

## 2022-11-25 DIAGNOSIS — R0902 Hypoxemia: Secondary | ICD-10-CM | POA: Diagnosis not present

## 2022-11-25 DIAGNOSIS — Z881 Allergy status to other antibiotic agents status: Secondary | ICD-10-CM

## 2022-11-25 DIAGNOSIS — Z7902 Long term (current) use of antithrombotics/antiplatelets: Secondary | ICD-10-CM

## 2022-11-25 DIAGNOSIS — I252 Old myocardial infarction: Secondary | ICD-10-CM | POA: Diagnosis not present

## 2022-11-25 DIAGNOSIS — Z90710 Acquired absence of both cervix and uterus: Secondary | ICD-10-CM

## 2022-11-25 LAB — TROPONIN I (HIGH SENSITIVITY)
Troponin I (High Sensitivity): 8 ng/L (ref <18)
Troponin I (High Sensitivity): 10 ng/L (ref <18)

## 2022-11-25 LAB — RESP PANEL BY RT-PCR (RSV, FLU A&B, COVID)  RVPGX2
Influenza A by PCR: NEGATIVE
Influenza B by PCR: NEGATIVE
Resp Syncytial Virus by PCR: NEGATIVE
SARS Coronavirus 2 by RT PCR: NEGATIVE

## 2022-11-25 LAB — BLOOD GAS, VENOUS
Acid-Base Excess: 12.3 mmol/L — ABNORMAL HIGH (ref 0.0–2.0)
Bicarbonate: 39.2 mmol/L — ABNORMAL HIGH (ref 20.0–28.0)
O2 Saturation: 79.4 %
Patient temperature: 37
pCO2, Ven: 59 mmHg (ref 44–60)
pH, Ven: 7.43 (ref 7.25–7.43)
pO2, Ven: 46 mmHg — ABNORMAL HIGH (ref 32–45)

## 2022-11-25 LAB — BASIC METABOLIC PANEL
Anion gap: 10 (ref 5–15)
BUN: 12 mg/dL (ref 8–23)
CO2: 31 mmol/L (ref 22–32)
Calcium: 9.1 mg/dL (ref 8.9–10.3)
Chloride: 87 mmol/L — ABNORMAL LOW (ref 98–111)
Creatinine, Ser: 0.65 mg/dL (ref 0.44–1.00)
GFR, Estimated: >60 mL/min (ref >60)
Glucose, Bld: 100 mg/dL — ABNORMAL HIGH (ref 70–99)
Potassium: 3.2 mmol/L — ABNORMAL LOW (ref 3.5–5.1)
Sodium: 128 mmol/L — ABNORMAL LOW (ref 135–145)

## 2022-11-25 LAB — CBC WITH DIFFERENTIAL/PLATELET
Abs Immature Granulocytes: 0.01 10*3/uL (ref 0.00–0.07)
Basophils Absolute: 0 10*3/uL (ref 0.0–0.1)
Basophils Relative: 0 %
Eosinophils Absolute: 0.1 10*3/uL (ref 0.0–0.5)
Eosinophils Relative: 1 %
HCT: 37.4 % (ref 36.0–46.0)
Hemoglobin: 12.8 g/dL (ref 12.0–15.0)
Immature Granulocytes: 0 %
Lymphocytes Relative: 16 %
Lymphs Abs: 1 10*3/uL (ref 0.7–4.0)
MCH: 30.9 pg (ref 26.0–34.0)
MCHC: 34.2 g/dL (ref 30.0–36.0)
MCV: 90.3 fL (ref 80.0–100.0)
Monocytes Absolute: 0.8 10*3/uL (ref 0.1–1.0)
Monocytes Relative: 13 %
Neutro Abs: 4.5 10*3/uL (ref 1.7–7.7)
Neutrophils Relative %: 70 %
Platelets: 199 10*3/uL (ref 150–400)
RBC: 4.14 MIL/uL (ref 3.87–5.11)
RDW: 12.4 % (ref 11.5–15.5)
WBC: 6.4 10*3/uL (ref 4.0–10.5)
nRBC: 0 % (ref 0.0–0.2)

## 2022-11-25 LAB — MAGNESIUM: Magnesium: 1.7 mg/dL (ref 1.7–2.4)

## 2022-11-25 LAB — SODIUM: Sodium: 125 mmol/L — ABNORMAL LOW (ref 135–145)

## 2022-11-25 LAB — BRAIN NATRIURETIC PEPTIDE: B Natriuretic Peptide: 85.7 pg/mL (ref 0.0–100.0)

## 2022-11-25 MED ORDER — METHYLPREDNISOLONE SODIUM SUCC 40 MG IJ SOLR
40.0000 mg | INTRAMUSCULAR | Status: AC
  Administered 2022-11-25: 40 mg via INTRAVENOUS
  Filled 2022-11-25: qty 1

## 2022-11-25 MED ORDER — SODIUM CHLORIDE 0.9 % IV SOLN: 2.0000 g | Freq: Three times a day (TID) | INTRAVENOUS | Status: DC

## 2022-11-25 MED ORDER — POTASSIUM CHLORIDE CRYS ER 20 MEQ PO TBCR
40.0000 meq | EXTENDED_RELEASE_TABLET | Freq: Once | ORAL | Status: AC
  Administered 2022-11-25: 40 meq via ORAL
  Filled 2022-11-25: qty 2

## 2022-11-25 MED ORDER — LORAZEPAM 0.5 MG PO TABS
0.5000 mg | ORAL_TABLET | Freq: Four times a day (QID) | ORAL | Status: DC | PRN
  Administered 2022-11-25 – 2022-11-29 (×12): 0.5 mg via ORAL
  Filled 2022-11-25 (×12): qty 1

## 2022-11-25 MED ORDER — IPRATROPIUM-ALBUTEROL 0.5-2.5 (3) MG/3ML IN SOLN
3.0000 mL | Freq: Once | RESPIRATORY_TRACT | Status: AC
  Administered 2022-11-25: 3 mL via RESPIRATORY_TRACT
  Filled 2022-11-25: qty 3

## 2022-11-25 MED ORDER — SODIUM CHLORIDE 0.9 % IV SOLN
2.0000 g | INTRAVENOUS | Status: DC
  Administered 2022-11-25 – 2022-11-26 (×2): 2 g via INTRAVENOUS
  Filled 2022-11-25 (×2): qty 12.5

## 2022-11-25 MED ORDER — NITROGLYCERIN 0.4 MG SL SUBL: 0.4000 mg | SUBLINGUAL_TABLET | SUBLINGUAL | Status: DC | PRN

## 2022-11-25 MED ORDER — ONDANSETRON HCL 4 MG PO TABS: 4.0000 mg | ORAL_TABLET | Freq: Four times a day (QID) | ORAL | Status: DC | PRN

## 2022-11-25 MED ORDER — PREDNISONE 20 MG PO TABS
40.0000 mg | ORAL_TABLET | Freq: Every day | ORAL | Status: AC
  Administered 2022-11-26 – 2022-11-29 (×4): 40 mg via ORAL
  Filled 2022-11-25 (×4): qty 2

## 2022-11-25 MED ORDER — IPRATROPIUM-ALBUTEROL 0.5-2.5 (3) MG/3ML IN SOLN
3.0000 mL | RESPIRATORY_TRACT | Status: DC | PRN
  Administered 2022-11-25 – 2022-11-27 (×6): 3 mL via RESPIRATORY_TRACT
  Filled 2022-11-25 (×4): qty 3

## 2022-11-25 MED ORDER — ENOXAPARIN SODIUM 40 MG/0.4ML IJ SOSY: 40.0000 mg | PREFILLED_SYRINGE | INTRAMUSCULAR | Status: DC

## 2022-11-25 MED ORDER — BISOPROLOL FUMARATE 5 MG PO TABS
5.0000 mg | ORAL_TABLET | Freq: Every day | ORAL | Status: DC
  Administered 2022-11-25 – 2022-11-29 (×5): 5 mg via ORAL
  Filled 2022-11-25 (×6): qty 1

## 2022-11-25 MED ORDER — SODIUM CHLORIDE 0.9 % IV SOLN: INTRAVENOUS | Status: DC

## 2022-11-25 MED ORDER — CLOPIDOGREL BISULFATE 75 MG PO TABS
75.0000 mg | ORAL_TABLET | Freq: Every day | ORAL | Status: DC
  Administered 2022-11-25 – 2022-11-29 (×5): 75 mg via ORAL
  Filled 2022-11-25 (×5): qty 1

## 2022-11-25 MED ORDER — PANTOPRAZOLE SODIUM 40 MG PO TBEC
40.0000 mg | DELAYED_RELEASE_TABLET | Freq: Every day | ORAL | Status: DC
  Administered 2022-11-25 – 2022-11-29 (×5): 40 mg via ORAL
  Filled 2022-11-25 (×5): qty 1

## 2022-11-25 MED ORDER — NYSTATIN 100000 UNIT/ML MT SUSP: 5.0000 mL | OROMUCOSAL | Status: DC | PRN

## 2022-11-25 MED ORDER — ENOXAPARIN SODIUM 30 MG/0.3ML IJ SOSY
30.0000 mg | PREFILLED_SYRINGE | INTRAMUSCULAR | Status: DC
  Filled 2022-11-25 (×3): qty 0.3

## 2022-11-25 MED ORDER — AMLODIPINE BESYLATE 5 MG PO TABS
5.0000 mg | ORAL_TABLET | Freq: Every day | ORAL | Status: DC
  Administered 2022-11-25 – 2022-11-29 (×5): 5 mg via ORAL
  Filled 2022-11-25 (×5): qty 1

## 2022-11-25 MED ORDER — ONDANSETRON HCL 4 MG/2ML IJ SOLN: 4.0000 mg | Freq: Four times a day (QID) | INTRAMUSCULAR | Status: DC | PRN

## 2022-11-25 NOTE — Assessment & Plan Note (Signed)
Decompensated respiratory failure in setting of baseline COPD and chronic home O2 use at 2 L Positive increased wheezing, sputum production and cough times approximately 4 weeks with increased O2 use up to 4 L On prednisone chronically as well as 3 times daily azithromycin Chest x-ray relatively stable Will place patient on IV Solu-Medrol, DuoNebs IV cefepime for Pseudomonas coverage Will also reach out to Dr. Karna Christmas for consultation

## 2022-11-25 NOTE — ED Provider Notes (Signed)
[facility] Provider Note    Event Date/Time   First MD Initiated Contact with Patient 11/25/22 [id number]     (approximate)   History   Chief Complaint Shortness of Breath   HPI  Christina Hester is a 82 y.o. female with past medical history of hypertension, CAD, diastolic CHF, COPD, PAD, and AAA who presents to the ED complaining of shortness of breath.  Patient reports that he she has become increasingly short of breath over the past 24 hours with a cough productive of yellowish sputum.  She denies any fevers but has had some soreness in the left side of her chest when she coughs.  She has not noticed any pain or swelling in her legs, describes symptoms as similar to prior COPD exacerbations.  When EMS arrived earlier this morning, patient found to have significant wheezing and was given 125 mg of IV Solu-Medrol along with a DuoNeb.  Patient reports that this helps with her symptoms.  She typically wears 2 L nasal cannula at night.     Physical Exam   Triage Vital Signs: ED Triage Vitals [11/25/22 0933]  Enc Vitals Group     BP (!) 165/76     Pulse Rate 99     Resp 20     Temp      Temp src      SpO2 99 %     Weight      Height      Head Circumference      Peak Flow      Pain Score      Pain Loc      Pain Edu?      Excl. in GC?     Most recent vital signs: Vitals:   11/25/22 0933  BP: (!) 165/76  Pulse: 99  Resp: 20  SpO2: 99%    Constitutional: Alert and oriented. Eyes: Conjunctivae are normal. Head: Atraumatic. Nose: No congestion/rhinnorhea. Mouth/Throat: Mucous membranes are moist.  Cardiovascular: Normal rate, regular rhythm. Grossly normal heart sounds.  2+ radial pulses bilaterally. Respiratory: Mildly tachypneic with increased respiratory effort.  No retractions. Lungs with inspiratory and expiratory wheezing throughout. Gastrointestinal: Soft and nontender. No distention. Musculoskeletal: No lower extremity tenderness nor  edema.  Neurologic:  Normal speech and language. No gross focal neurologic deficits are appreciated.    ED Results / Procedures / Treatments   Labs (all labs ordered are listed, but only abnormal results are displayed) Labs Reviewed  BASIC METABOLIC PANEL - Abnormal; Notable for the following components:      Result Value   Sodium 128 (*)    Potassium 3.2 (*)    Chloride 87 (*)    Glucose, Bld 100 (*)    All other components within normal limits  RESP PANEL BY RT-PCR (RSV, FLU A&B, COVID)  [id number]  CBC WITH DIFFERENTIAL/PLATELET  BRAIN NATRIURETIC PEPTIDE  TROPONIN I (HIGH SENSITIVITY)  TROPONIN I (HIGH SENSITIVITY)     EKG  ED ECG REPORT I, Chesley Noon, the attending physician, personally viewed and interpreted this ECG.   Date: 11/25/2022  EKG Time: 10:36  Rate: 100  Rhythm: sinus tachycardia  Axis: Normal  Intervals:none  ST&T Change: None  RADIOLOGY Chest x-ray reviewed and interpreted by me with no infiltrate, edema, or effusion.  PROCEDURES:  Critical Care performed: No  Procedures   MEDICATIONS ORDERED IN ED: Medications  ipratropium-albuterol (DUONEB) 0.5-2.5 (3) MG/3ML nebulizer solution 3 mL (3 mLs Nebulization Given 11/25/22 0940)  ipratropium-albuterol (  DUONEB) 0.5-2.5 (3) MG/3ML nebulizer solution 3 mL (3 mLs Nebulization Given 11/25/22 1100)     IMPRESSION / MDM / ASSESSMENT AND PLAN / ED COURSE  I reviewed the triage vital signs and the nursing notes.                              82 y.o. female with past medical history of hypertension, CAD, CHF, COPD, PAD, and AAA who presents to the ED complaining of increasing difficulty breathing with productive cough and soreness in her chest with coughing over the past 24 hours.  Patient's presentation is most consistent with acute presentation with potential threat to life or bodily function.  Differential diagnosis includes, but is not limited to, ACS, PE, pneumonia, bronchitis, COPD exacerbation,  COVID-19, influenza, CHF exacerbation.  Patient ill-appearing, mildly tachypneic with increased work of breathing, currently maintaining oxygen saturations on 2 L nasal cannula.  She has significant wheezing on exam, received IV steroids and DuoNeb prior to arrival, will give follow-up breathing treatment.  Plan to check EKG and troponin but symptoms seem atypical for ACS or PE.  Chest x-ray and labs are pending at this time.  EKG shows no evidence of arrhythmia or ischemia, troponin within normal limits and I doubt ACS or PE.  Chest x-ray is also unremarkable, patient's work of breathing slightly improved following additional breathing treatment, but she continues to have significant wheezing and would benefit from admission.  Case discussed with hospitalist for admission for further management of COPD exacerbation.      FINAL CLINICAL IMPRESSION(S) / ED DIAGNOSES   Final diagnoses:  COPD exacerbation (HCC)     Rx / DC Orders   ED Discharge Orders     None        Note:  This document was prepared using Dragon voice recognition software and may include unintentional dictation errors.   Chesley Noon, MD 11/25/22 1119

## 2022-11-25 NOTE — Progress Notes (Signed)
Pt received in room 142. Alert and oriented. Stable. O2 at 2l [place]. Vitals obtained. Pt assisted to use the [facility]. Voided and had a small BM. Sacral pad (allevyn dsg) applied. Pt medicated for anxiety. Report given to Adeyemi, LPN.

## 2022-11-25 NOTE — Assessment & Plan Note (Addendum)
Cardiac catheterization 02/2017 with severe one-vessel CAD with chronically occluded RCA with well developed left-to-right collaterals  No active chest pain Troponin negative x 1 EKG stable Noted ASA intolerance- on plavix  Continue home regimen

## 2022-11-25 NOTE — Progress Notes (Signed)
PHARMACY NOTE:  ANTIMICROBIAL RENAL DOSAGE ADJUSTMENT  Current antimicrobial regimen includes a mismatch between antimicrobial dosage and estimated renal function.  As per policy approved by the Pharmacy & Therapeutics and Medical Executive Committees, the antimicrobial dosage will be adjusted accordingly.  Current antimicrobial dosage:  Cefepime 2g IV every 8 hours  Indication: COPD exacerbation   Renal Function:  Estimated Creatinine Clearance: 27.7 mL/min (by C-G formula based on SCr of 0.65 mg/dL).  Antimicrobial dosage has been changed to:  Cefepime 2g IV every 24 hours (CrCL <46ml/min)   Additional comments:   Thank you for allowing pharmacy to be a part of this patient's care.  Gardner Candle, PharmD, BCPS Clinical Pharmacist 11/25/2022 1:17 PM

## 2022-11-25 NOTE — Assessment & Plan Note (Signed)
Followed by Dr. Wyn Quaker 08/2022 Duplex shows a patent right carotid endarterectomy and stable 40-59% left ICA stenosis  Continue home regimen including Plavix

## 2022-11-25 NOTE — H&P (Addendum)
History and Physical    Patient: Christina Hester:[id number] DOB: 1940/10/10 DOA: 11/25/2022 DOS: the patient was seen and examined on 11/25/2022 PCP: Sallyanne Kuster, NP  Patient coming from: Home  Chief Complaint:  Chief Complaint  Patient presents with   Shortness of Breath   HPI: Christina Hester is a 82 y.o. female with medical history significant of chronic respiratory failure on 2 L, COPD on chronic prednisone, diastolic dysfunction, carotid artery stenosis, peripheral artery disease presenting with acute on chronic respiratory failure with hypoxia, COPD exacerbation.  Patient ports increased work of breathing with past month or so.  Baseline chronic respiratory failure on 2 L.  Has had increased use up to 4 L at home.  On chronic low-dose prednisone as well as azithromycin 3 times weekly.  No fevers or chills.  No reported recent sick contacts.  No chest pain or belly pain.  Still smoking up to 1/2 pack/day cigarettes up until 3 days ago.  No nausea or vomiting. Presented to the ER afebrile, hemodynamically stable, satting in the mid 90s on 2 L.  White count 6.4, hemoglobin 12.8, platelets 199, troponin 10, VBG stable, COVID flu RSV negative, creatinine 0.65.  Sodium 128.  BNP 86.  Chest x-ray stable. Review of Systems: As mentioned in the history of present illness. All other systems reviewed and are negative. Past Medical History:  Diagnosis Date   AAA (abdominal aortic aneurysm) (HCC)    a. 05/2016 Abd U/S: 3.02 x 3.02 AAA.   Anxiety    CAD (coronary artery disease)    a. 02/2017 NSTEMI/Cath: LM nl, LAD mild dzs, D2 min irregs, LCX mild dzs, OM2 50ost, RCA 95p, 183m, L-->R collats, EF 55-65%.   COPD (chronic obstructive pulmonary disease) (HCC)    Diastolic dysfunction    a. 02/2017 Echo: EF 55-60%, gr1 DD, ? inf HK, mild MR.   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 05/2016 ABI's: R 1.22, L 1.11.   Tobacco abuse    Past Surgical History:  Procedure Laterality Date   ABDOMINAL HYSTERECTOMY     APPENDECTOMY     BREAST EXCISIONAL BIOPSY Left 80's or 90's   NEG   CARDIAC CATHETERIZATION     COLONOSCOPY N/A 05/22/2018   Procedure: COLONOSCOPY;  Surgeon: Toney Reil, MD;  Location: [facility] ENDOSCOPY;  Service: Gastroenterology;  Laterality: N/A;   COLONOSCOPY WITH PROPOFOL N/A 07/19/2020   Procedure: COLONOSCOPY WITH PROPOFOL;  Surgeon: Toledo, Boykin Nearing, MD;  Location: [facility] ENDOSCOPY;  Service: Gastroenterology;  Laterality: N/A;   ENDARTERECTOMY Right 11/06/2019   Procedure: ENDARTERECTOMY CAROTID;  Surgeon: Annice Needy, MD;  Location: [facility] ORS;  Service: Vascular;  Laterality: Right;   LEFT HEART CATH Bilateral 03/05/2017   Procedure: Left Heart Cath with poss PCI;  Surgeon: Iran Ouch, MD;  Location: [facility] INVASIVE CV LAB;  Service: Cardiovascular;  Laterality: Bilateral;   Social History:  reports that she quit smoking about 5 years ago. Her smoking use included cigarettes. She smoked an average of .25 packs per day. She has never used smokeless tobacco. She reports that she does not drink alcohol and does not use drugs.  Allergies  Allergen Reactions   Hydrocodone-Acetaminophen Other (See Comments)    Facial numbness   Aspirin Tinitus   Budesonide-Formoterol  Fumarate Other (See Comments)    Patient felt "out of her mind" and angry. Patient felt "out of her mind" and angry.   Prednisone Other (See Comments)    Makes violent. Patient tolerates low doses   Sulfa Antibiotics Nausea And Vomiting   Tramadol Other (See Comments)    Family History  Problem Relation Age of Onset   Breast cancer Mother    Dementia Mother    Hypertension Mother    Breast cancer Maternal Aunt    Breast cancer Maternal Aunt    Breast cancer Maternal Aunt    CVA Father     Prior to Admission medications    Medication Sig Start Date End Date Taking? Authorizing Provider  amLODipine (NORVASC) 5 MG tablet Take 5 mg by mouth daily.   Yes [provider]  azithromycin (ZITHROMAX) 250 MG tablet Take 1 tablet by mouth every Monday, Wednesday, and Friday. 12/20/21  Yes [provider]  baclofen (LIORESAL) 10 MG tablet Take 0.5 tablets (5 mg total) by mouth 3 (three) times daily as needed for muscle spasms. 07/20/22 07/20/23 Yes Venetia Night, MD  bisoprolol (ZEBETA) 5 MG tablet Take 1 tablet (5 mg total) by mouth daily. 01/19/22  Yes Abernathy, Arlyss Repress, NP  buPROPion (WELLBUTRIN) 100 MG tablet Take 1 tablet (100 mg total) by mouth daily. 01/19/22  Yes Abernathy, Arlyss Repress, NP  clopidogrel (PLAVIX) 75 MG tablet Take 1 tablet (75 mg total) by mouth daily. 01/19/22  Yes Abernathy, Arlyss Repress, NP  clotrimazole (MYCELEX) 10 MG troche Take 10 mg by mouth as needed (thrush). 06/16/21  Yes [provider]  hydrOXYzine (ATARAX) 10 MG tablet Take 1 tablet (10 mg total) by mouth 3 (three) times daily as needed for anxiety. 09/04/22  Yes Abernathy, [name], NP  ipratropium-albuterol (DUONEB) 0.5-2.5 (3) MG/3ML SOLN USE 1 VIAL IN NEBULIZER EVERY 4 HOURS 06/13/22  Yes Abernathy, [name], NP  LORazepam (ATIVAN) 0.5 MG tablet TAKE 1/2 TO 1 TABLET BY MOUTH EVERY 8 HOURS AS NEEDED FOR ANXIETY OR SLEEP 10/02/22  Yes Abernathy, [name], NP  meloxicam (MOBIC) 15 MG tablet Take 1 tablet (15 mg total) by mouth daily. 07/20/22  Yes Abernathy, Arlyss Repress, NP  nitroGLYCERIN (NITROSTAT) 0.4 MG SL tablet Place 1 tablet (0.4 mg total) under the tongue every 5 (five) minutes as needed for chest pain. 01/18/21  Yes Abernathy, Arlyss Repress, NP  nystatin (MYCOSTATIN) 100000 UNIT/ML suspension Take 5 mLs (500,000 Units total) by mouth 4 (four) times daily. Patient taking differently: Take 5 mLs by mouth as needed (thrush). 10/03/21  Yes Abernathy, [name], NP  ondansetron (ZOFRAN) 4 MG tablet Take 1 tablet (4 mg total) by mouth every 8 (eight) hours as  needed for nausea or vomiting. 01/19/22  Yes Abernathy, [name], NP  pantoprazole (PROTONIX) 40 MG tablet Take 1 tablet (40 mg total) by mouth daily. 09/29/22  Yes Iran Ouch, MD  predniSONE (DELTASONE) 1 MG tablet Take 2 mg by mouth daily with breakfast. 01/11/22  Yes [provider]  VENTOLIN HFA 108 (90 Base) MCG/ACT inhaler INHALE 2 PUFFS INTO THE LUNGS EVERY 6 HOURS AS NEEDED FOR WHEEZING OR SHORTNESS OF BREATH. 01/19/22  Yes Abernathy, [name], NP  benzonatate (TESSALON) 200 MG capsule Take 200 mg by mouth as needed for cough. Patient not taking: Reported on 11/25/2022 11/10/19   [provider]  clotrimazole (MYCELEX) 10 MG troche Take 1 tablet (10 mg total) by mouth 5 (five) times daily. Patient not taking: Reported on 11/25/2022 06/16/21   Sallyanne Kuster, NP  Physical Exam: Vitals:   11/25/22 0934 11/25/22 1100 11/25/22 1130 11/25/22 1230  BP:  (!) 146/76 (!) 146/61   Pulse:  99 (!) 103 86  Resp:  (!) 24 (!) 25 20  SpO2:  97% 97% 96%  Weight: 31.3 kg     Height: 5' (1.524 m)      Physical Exam Constitutional:      Comments: Underweight  Cachectic    HENT:     Head: Normocephalic.     Mouth/Throat:     Mouth: Mucous membranes are dry.  Eyes:     Pupils: Pupils are equal, round, and reactive to light.  Cardiovascular:     Rate and Rhythm: Normal rate and regular rhythm.  Pulmonary:     Breath sounds: Wheezing present.     Comments: Minimal to mild increased WOB  Abdominal:     General: Bowel sounds are normal.  Musculoskeletal:        General: Normal range of motion.  Skin:    General: Skin is warm.  Neurological:     General: No focal deficit present.     Mental Status: She is alert.  Psychiatric:        Mood and Affect: Mood normal.     Data Reviewed:  There are no new results to review at this time. DG Chest 2 View CLINICAL DATA:  Shortness of breath  EXAM: CHEST - 2 VIEW  COMPARISON:  09/04/2022  FINDINGS: Normal heart size  and mediastinal contours. Large lung volumes. No acute infiltrate or edema. No effusion or pneumothorax. No acute osseous findings.  IMPRESSION: No active cardiopulmonary disease.  Electronically Signed   By: Tiburcio Pea M.D.   On: 11/25/2022 10:28  Lab Results  Component Value Date   WBC 6.4 11/25/2022   HGB 12.8 11/25/2022   HCT 37.4 11/25/2022   MCV 90.3 11/25/2022   PLT 199 11/25/2022   Last metabolic panel Lab Results  Component Value Date   GLUCOSE 100 (H) 11/25/2022   NA 128 (L) 11/25/2022   K 3.2 (L) 11/25/2022   CL 87 (L) 11/25/2022   CO2 31 11/25/2022   BUN 12 11/25/2022   CREATININE 0.65 11/25/2022   GFRNONAA >60 11/25/2022   CALCIUM 9.1 11/25/2022   PHOS 2.9 07/15/2017   PROT 7.6 06/09/2022   ALBUMIN 4.3 06/09/2022   LABGLOB 2.5 10/04/2021   AGRATIO 1.9 10/04/2021   BILITOT 0.8 06/09/2022   ALKPHOS 79 06/09/2022   AST 31 06/09/2022   ALT 23 06/09/2022   ANIONGAP 10 11/25/2022    Assessment and Plan: * Acute on chronic respiratory failure with hypoxia (HCC) Decompensated respiratory failure in setting of baseline COPD and chronic home O2 use at 2 L Positive increased wheezing, sputum production and cough times approximately 4 weeks with increased O2 use up to 4 L On prednisone chronically as well as 3 times weekly azithromycin Chest x-ray relatively stable Will place patient on IV Solu-Medrol, DuoNebs IV cefepime for Pseudomonas coverage Will also reach out to Dr. Karna Christmas for consultation  (HFpEF) heart failure with preserved ejection fraction (HCC) 02/2017 Echo: EF 55-60%, gr1 DD  Mildly dry  Gentle IVF hydration  Follow volume status   Carotid stenosis Followed by Dr. Wyn Quaker 08/2022 Duplex shows a patent right carotid endarterectomy and stable 40-59% left ICA stenosis  Continue home regimen including Plavix  Coronary artery disease Cardiac catheterization 02/2017 with severe one-vessel CAD with chronically occluded RCA with well developed  left-to-right collaterals  No active  chest pain Troponin negative x 1 EKG stable Noted ASA intolerance- on plavix  Continue home regimen  Hyponatremia Sodium 128 on presentation Looks to be acute on chronic issue with sodium baseline around 133 Clinically dry IV fluid hydration Trend sodium Follow closely      Advance Care Planning:   Code Status: Full Code   Consults: Dr. Karna Christmas w/ pulmonology   Family Communication: No family at the bedside   Severity of Illness: The appropriate patient status for this patient is INPATIENT. Inpatient status is judged to be reasonable and necessary in order to provide the required intensity of service to ensure the patient's safety. The patient's presenting symptoms, physical exam findings, and initial radiographic and laboratory data in the context of their chronic comorbidities is felt to place them at high risk for further clinical deterioration. Furthermore, it is not anticipated that the patient will be medically stable for discharge from the hospital within 2 midnights of admission.   * I certify that at the point of admission it is my clinical judgment that the patient will require inpatient hospital care spanning beyond 2 midnights from the point of admission due to high intensity of service, high risk for further deterioration and high frequency of surveillance required.*  Author: Floydene Flock, MD 11/25/2022 1:25 PM  For on call review [email or web address].

## 2022-11-25 NOTE — Assessment & Plan Note (Signed)
02/2017 Echo: EF 55-60%, gr1 DD  Mildly dry  Gentle IVF hydration  Follow volume status

## 2022-11-25 NOTE — Assessment & Plan Note (Signed)
Sodium 128 on presentation Looks to be acute on chronic issue with sodium baseline around 133 Clinically dry IV fluid hydration Trend sodium Follow closely

## 2022-11-25 NOTE — ED Triage Notes (Signed)
Pt also states takes prednisolone 2mg  everyday and an antibiotic to help prevent this from happening.

## 2022-11-25 NOTE — Plan of Care (Signed)
?  Problem: Education: ?Goal: Knowledge of disease or condition will improve ?Outcome: Progressing ?Goal: Knowledge of the prescribed therapeutic regimen will improve ?Outcome: Progressing ?  ?Problem: Activity: ?Goal: Ability to tolerate increased activity will improve ?Outcome: Progressing ?Goal: Will verbalize the importance of balancing activity with adequate rest periods ?Outcome: Progressing ?  ?Problem: Respiratory: ?Goal: Ability to maintain a clear airway will improve ?Outcome: Progressing ?Goal: Levels of oxygenation will improve ?Outcome: Progressing ?Goal: Ability to maintain adequate ventilation will improve ?Outcome: Progressing ?  ?Problem: Education: ?Goal: Knowledge of General Education information will improve ?Description: Including pain rating scale, medication(s)/side effects and non-pharmacologic comfort measures ?Outcome: Progressing ?  ?Problem: Health Behavior/Discharge Planning: ?Goal: Ability to manage health-related needs will improve ?Outcome: Progressing ?  ?Problem: Clinical Measurements: ?Goal: Ability to maintain clinical measurements within normal limits will improve ?Outcome: Progressing ?Goal: Will remain free from infection ?Outcome: Progressing ?Goal: Diagnostic test results will improve ?Outcome: Progressing ?Goal: Respiratory complications will improve ?Outcome: Progressing ?Goal: Cardiovascular complication will be avoided ?Outcome: Progressing ?  ?Problem: Activity: ?Goal: Risk for activity intolerance will decrease ?Outcome: Progressing ?  ?Problem: Nutrition: ?Goal: Adequate nutrition will be maintained ?Outcome: Progressing ?  ?Problem: Coping: ?Goal: Level of anxiety will decrease ?Outcome: Progressing ?  ?Problem: Elimination: ?Goal: Will not experience complications related to bowel motility ?Outcome: Progressing ?Goal: Will not experience complications related to urinary retention ?Outcome: Progressing ?  ?Problem: Pain Managment: ?Goal: General experience of comfort  will improve ?Outcome: Progressing ?  ?Problem: Safety: ?Goal: Ability to remain free from injury will improve ?Outcome: Progressing ?  ?Problem: Skin Integrity: ?Goal: Risk for impaired skin integrity will decrease ?Outcome: Progressing ?  ?

## 2022-11-25 NOTE — Assessment & Plan Note (Signed)
Decompensated respiratory failure in setting of baseline COPD and chronic home O2 use at 2 L Positive increased wheezing, sputum production and cough times approximately 4 weeks with increased O2 use up to 4 L On prednisone chronically as well as 3 times weekly azithromycin Chest x-ray relatively stable Will place patient on IV Solu-Medrol, DuoNebs IV cefepime for Pseudomonas coverage Will also reach out to Dr. Karna Christmas for consultation

## 2022-11-25 NOTE — ED Triage Notes (Signed)
Pt to ED via [facility] from Home for SOB. Pt states started last night progressively gotten worse. Pt wears 2L [place] chronically, had to up it to 4L at home. Pt received 1.5 albuterol treatments. Pt states coughing up yellow junk.

## 2022-11-25 NOTE — Progress Notes (Signed)
PHARMACIST - PHYSICIAN COMMUNICATION  CONCERNING:  Enoxaparin (Lovenox) for DVT Prophylaxis    RECOMMENDATION: Patient was prescribed enoxaprin 40mg  q24 hours for VTE prophylaxis.   Filed Weights   11/25/22 0934  Weight: 31.3 kg (69 lb)    Body mass index is 13.48 kg/m.  Estimated Creatinine Clearance: 27.7 mL/min (by C-G formula based on SCr of 0.65 mg/dL).  Patient is candidate for enoxaparin 30mg  every 24 hours based on CrCl <25ml/min  DESCRIPTION: Pharmacy has adjusted enoxaparin dose per [facility] policy.  Patient is now receiving enoxaparin 30 mg every 24 hours    Gardner Candle, PharmD, BCPS Clinical Pharmacist 11/25/2022 1:14 PM

## 2022-11-25 NOTE — Assessment & Plan Note (Signed)
Followed by Dr. Dew 08/2022 Duplex shows a patent right carotid endarterectomy and stable 40-59% left ICA stenosis  Continue home regimen including Plavix 

## 2022-11-25 NOTE — Plan of Care (Signed)
Patient A&Ox4, from home, independent in room  

## 2022-11-26 DIAGNOSIS — J9621 Acute and chronic respiratory failure with hypoxia: Secondary | ICD-10-CM | POA: Diagnosis not present

## 2022-11-26 LAB — COMPREHENSIVE METABOLIC PANEL
ALT: 29 U/L (ref 0–44)
AST: 44 U/L — ABNORMAL HIGH (ref 15–41)
Albumin: 4.3 g/dL (ref 3.5–5.0)
Alkaline Phosphatase: 74 U/L (ref 38–126)
Anion gap: 9 (ref 5–15)
BUN: 21 mg/dL (ref 8–23)
CO2: 29 mmol/L (ref 22–32)
Calcium: 9.5 mg/dL (ref 8.9–10.3)
Chloride: 89 mmol/L — ABNORMAL LOW (ref 98–111)
Creatinine, Ser: 0.73 mg/dL (ref 0.44–1.00)
GFR, Estimated: >60 mL/min (ref >60)
Glucose, Bld: 135 mg/dL — ABNORMAL HIGH (ref 70–99)
Potassium: 4.4 mmol/L (ref 3.5–5.1)
Sodium: 127 mmol/L — ABNORMAL LOW (ref 135–145)
Total Bilirubin: 0.7 mg/dL (ref 0.3–1.2)
Total Protein: 7.4 g/dL (ref 6.5–8.1)

## 2022-11-26 LAB — RESPIRATORY PANEL BY PCR

## 2022-11-26 LAB — CBC
HCT: 36.1 % (ref 36.0–46.0)
Hemoglobin: 12.4 g/dL (ref 12.0–15.0)
MCH: 30.8 pg (ref 26.0–34.0)
MCHC: 34.3 g/dL (ref 30.0–36.0)
MCV: 89.6 fL (ref 80.0–100.0)
Platelets: 201 10*3/uL (ref 150–400)
RBC: 4.03 MIL/uL (ref 3.87–5.11)
RDW: 12.2 % (ref 11.5–15.5)
WBC: 3.8 10*3/uL — ABNORMAL LOW (ref 4.0–10.5)
nRBC: 0 % (ref 0.0–0.2)

## 2022-11-26 MED ORDER — ACETAMINOPHEN 325 MG PO TABS
650.0000 mg | ORAL_TABLET | Freq: Four times a day (QID) | ORAL | Status: DC | PRN
  Administered 2022-11-26 – 2022-11-27 (×3): 650 mg via ORAL
  Filled 2022-11-26 (×3): qty 2

## 2022-11-26 MED ORDER — IPRATROPIUM-ALBUTEROL 0.5-2.5 (3) MG/3ML IN SOLN
3.0000 mL | RESPIRATORY_TRACT | Status: DC
  Administered 2022-11-26 – 2022-11-27 (×6): 3 mL via RESPIRATORY_TRACT
  Filled 2022-11-26 (×7): qty 3

## 2022-11-26 MED ORDER — AZITHROMYCIN 250 MG PO TABS
250.0000 mg | ORAL_TABLET | Freq: Every day | ORAL | Status: DC
  Administered 2022-11-26 – 2022-11-29 (×4): 250 mg via ORAL
  Filled 2022-11-26 (×4): qty 1

## 2022-11-26 MED ORDER — BUDESONIDE 0.25 MG/2ML IN SUSP
0.2500 mg | Freq: Two times a day (BID) | RESPIRATORY_TRACT | Status: DC
  Administered 2022-11-26 – 2022-11-29 (×7): 0.25 mg via RESPIRATORY_TRACT
  Filled 2022-11-26 (×7): qty 2

## 2022-11-26 NOTE — Progress Notes (Signed)
Pt refused lovenox subcutaneous injection but educated about its importance. Will continue to monitor.  Update 0413: Pt refused bed alarm at this time. Will continue to monitor.

## 2022-11-26 NOTE — Consult Note (Signed)
PULMONOLOGY         Date: 11/26/2022,   MRN# [id number] Christina Hester 14-Oct-1940     AdmissionWeight: 31.3 kg                 CurrentWeight: 29.8 kg  Referring provider: Dr Idelle Leech   CHIEF COMPLAINT:   Acute on chronic hypoxemic respiratory failure   HISTORY OF PRESENT ILLNESS   [name] with hx of advanced copd and chronic hypoxemia with 2L/min [place].  She shares over past week prior to admission she progressively deteriorated at home with chest discomfort, cough and worsening dyspnea.  She denies having febrile illness.  She does take prednisone chronically.  She was admitted with severe COPD exacerbation with findings of heavy inspissated phlegm on expectoration. She required 4L/min to reach 88% on spO2.  She is slightly improved now. She has not been able to eat well due to feeling uwell.  She did get up oob to chair today to go to bathroom and sit up by window to watch TV.  She is exhausted after few minutes of light activity.    PAST MEDICAL HISTORY   Past Medical History:  Diagnosis Date   AAA (abdominal aortic aneurysm) (HCC)    a. 05/2016 Abd U/S: 3.02 x 3.02 AAA.   Anxiety    CAD (coronary artery disease)    a. 02/2017 NSTEMI/Cath: LM nl, LAD mild dzs, D2 min irregs, LCX mild dzs, OM2 50ost, RCA 95p, 137m, L-->R collats, EF 55-65%.   COPD (chronic obstructive pulmonary disease) (HCC)    Diastolic dysfunction    a. 02/2017 Echo: EF 55-60%, gr1 DD, ? inf HK, mild MR.   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 05/2016 ABI's: R 1.22, L 1.11.   Tobacco abuse      SURGICAL HISTORY   Past Surgical History:  Procedure Laterality Date   ABDOMINAL HYSTERECTOMY     APPENDECTOMY     BREAST EXCISIONAL BIOPSY Left 80's or 90's   NEG   CARDIAC CATHETERIZATION     COLONOSCOPY N/A 05/22/2018   Procedure: COLONOSCOPY;   Surgeon: Toney Reil, MD;  Location: [facility];  Service: Gastroenterology;  Laterality: N/A;   COLONOSCOPY WITH PROPOFOL N/A 07/19/2020   Procedure: COLONOSCOPY WITH PROPOFOL;  Surgeon: Toledo, Boykin Nearing, MD;  Location: [facility];  Service: Gastroenterology;  Laterality: N/A;   ENDARTERECTOMY Right 11/06/2019   Procedure: ENDARTERECTOMY CAROTID;  Surgeon: Annice Needy, MD;  Location: [facility];  Service: Vascular;  Laterality: Right;   LEFT HEART CATH Bilateral 03/05/2017   Procedure: Left Heart Cath with poss PCI;  Surgeon: Iran Ouch, MD;  Location: [facility];  Service: Cardiovascular;  Laterality: Bilateral;     FAMILY HISTORY   Family History  Problem Relation Age of Onset   Breast cancer Mother    Dementia Mother    Hypertension Mother    Breast cancer Maternal Aunt    Breast cancer Maternal Aunt    Breast cancer Maternal Aunt    CVA Father      SOCIAL HISTORY   Social History   Tobacco Use   Smoking status: Former    Packs/day: .25    Types: Cigarettes    Quit date: 07/12/2017  Years since quitting: 5.3   Smokeless tobacco: Never   Tobacco comments:    currently 90 days without smoking  Vaping Use   Vaping Use: Never used  Substance Use Topics   Alcohol use: No   Drug use: No     MEDICATIONS    Home Medication:    Current Medication:  Current Facility-Administered Medications:    0.9 %  sodium chloride infusion, , Intravenous, Continuous, Floydene Flock, MD, Last Rate: 75 mL/hr at 11/25/22 1454, New Bag at 11/25/22 1454   acetaminophen (TYLENOL) tablet 650 mg, 650 mg, Oral, Q6H PRN, Willeen Niece, MD   amLODipine (NORVASC) tablet 5 mg, 5 mg, Oral, Daily, Floydene Flock, MD, 5 mg at 11/26/22 0912   bisoprolol (ZEBETA) tablet 5 mg, 5 mg, Oral, Daily, Floydene Flock, MD, 5 mg at 11/26/22 0912   budesonide (PULMICORT) nebulizer solution 0.25 mg, 0.25 mg, Nebulization, BID, Manuela Schwartz, NP, 0.25 mg at 11/26/22  1008   ceFEPIme (MAXIPIME) 2 g in sodium chloride 0.9 % 100 mL IVPB, 2 g, Intravenous, Q24H, Hallaji, Sheema M, RPH, Last Rate: 200 mL/hr at 11/26/22 1333, 2 g at 11/26/22 1333   clopidogrel (PLAVIX) tablet 75 mg, 75 mg, Oral, Daily, Floydene Flock, MD, 75 mg at 11/26/22 0912   enoxaparin (LOVENOX) injection 30 mg, 30 mg, Subcutaneous, Q24H, Hallaji, Sheema M, RPH   ipratropium-albuterol (DUONEB) 0.5-2.5 (3) MG/3ML nebulizer solution 3 mL, 3 mL, Nebulization, Q4H PRN, Floydene Flock, MD, 3 mL at 11/26/22 0651   ipratropium-albuterol (DUONEB) 0.5-2.5 (3) MG/3ML nebulizer solution 3 mL, 3 mL, Nebulization, Q4H, Manuela Schwartz, NP, 3 mL at 11/26/22 1008   LORazepam (ATIVAN) tablet 0.5 mg, 0.5 mg, Oral, Q6H PRN, Floydene Flock, MD, 0.5 mg at 11/26/22 0912   nitroGLYCERIN (NITROSTAT) SL tablet 0.4 mg, 0.4 mg, Sublingual, Q5 min PRN, Floydene Flock, MD   nystatin (MYCOSTATIN) 100000 UNIT/ML suspension 500,000 Units, 5 mL, Oral, PRN, Floydene Flock, MD   ondansetron [facility]) tablet 4 mg, 4 mg, Oral, Q6H PRN **OR** ondansetron (ZOFRAN) injection 4 mg, 4 mg, Intravenous, Q6H PRN, Floydene Flock, MD   pantoprazole (PROTONIX) EC tablet 40 mg, 40 mg, Oral, Daily, Floydene Flock, MD, 40 mg at 11/26/22 0912   [COMPLETED] methylPREDNISolone sodium succinate (SOLU-MEDROL) 40 mg/mL injection 40 mg, 40 mg, Intravenous, Q24H, 40 mg at 11/25/22 1504 **FOLLOWED BY** predniSONE (DELTASONE) tablet 40 mg, 40 mg, Oral, Q breakfast, Floydene Flock, MD, 40 mg at 11/26/22 1610    ALLERGIES   Hydrocodone-acetaminophen, Aspirin, Budesonide-formoterol fumarate, Prednisone, Sulfa antibiotics, and Tramadol     REVIEW OF SYSTEMS    Review of Systems:  Gen:  Denies  fever, sweats, chills weigh loss  HEENT: Denies blurred vision, double vision, ear pain, eye pain, hearing loss, nose bleeds, sore throat Cardiac:  No dizziness, chest pain or heaviness, chest tightness,edema Resp:   reports dyspnea  chronically  Gi: Denies swallowing difficulty, stomach pain, nausea or vomiting, diarrhea, constipation, bowel incontinence Gu:  Denies bladder incontinence, burning urine Ext:   Denies Joint pain, stiffness or swelling Skin: Denies  skin rash, easy bruising or bleeding or hives Endoc:  Denies polyuria, polydipsia , polyphagia or weight change Psych:   Denies depression, insomnia or hallucinations   Other:  All other systems negative   VS: BP (!) 134/58   Pulse 82   Temp 98.2 F (36.8 C) (Oral)   Resp 18   Ht 5' (1.524 m)   Wt  29.8 kg   SpO2 100%   BMI 12.83 kg/m      PHYSICAL EXAM    GENERAL:NAD, no fevers, chills, no weakness no fatigue HEAD: Normocephalic, atraumatic.  EYES: Pupils equal, round, reactive to light. Extraocular muscles intact. No scleral icterus.  MOUTH: Moist mucosal membrane. Dentition intact. No abscess noted.  EAR, NOSE, THROAT: Clear without exudates. No external lesions.  NECK: Supple. No thyromegaly. No nodules. No JVD.  PULMONARY: decreased breath sounds with mild rhonchi worse at bases bilaterally.  CARDIOVASCULAR: S1 and S2. Regular rate and rhythm. No murmurs, rubs, or gallops. No edema. Pedal pulses 2+ bilaterally.  GASTROINTESTINAL: Soft, nontender, nondistended. No masses. Positive bowel sounds. No hepatosplenomegaly.  MUSCULOSKELETAL: No swelling, clubbing, or edema. Range of motion full in all extremities.  NEUROLOGIC: Cranial nerves II through XII are intact. No gross focal neurological deficits. Sensation intact. Reflexes intact.  SKIN: No ulceration, lesions, rashes, or cyanosis. Skin warm and dry. Turgor intact.  PSYCHIATRIC: Mood, affect within normal limits. The patient is awake, alert and oriented x 3. Insight, judgment intact.       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  - Patient is on cefepime and solumedrol, will reduce to zithromax po and prednisone 50mg  with tapering -RVP for etiology of current exacerbation  -DC pulmicort  -continue duoneb -PT/OT and IS -reviewed plan with RN and patient             Thank you for allowing me to participate in the care of this patient.   Patient/Family are satisfied with care plan and all questions have been answered.    Provider disclosure: Patient with at least one acute or chronic illness or injury that poses a threat to life or bodily function and is being managed actively during this encounter.  All of the below services have been performed independently by signing provider:  review of prior documentation from internal and or external health records.  Review of previous and current lab results.  Interview and comprehensive assessment during patient visit today. Review of current and previous chest radiographs/CT scans. Discussion of management and test interpretation with health care team and patient/family.   This document was prepared using Dragon voice recognition software and may include unintentional dictation errors.     Vida Rigger, M.D.  Division of Pulmonary & Critical Care Medicine

## 2022-11-26 NOTE — Progress Notes (Signed)
PROGRESS NOTE    Christina Hester  ZOX:[id number] DOB: 23-May-1941 DOA: 11/25/2022  PCP: Sallyanne Kuster, NP   Brief Narrative:  This 82 yrs old female with PMH significant of chronic hypoxic respiratory failure on 2 L, COPD on chronic prednisone, diastolic dysfunction, carotid artery stenosis, peripheral artery disease presented in the ED with acute on chronic respiratory failure with hypoxia, COPD exacerbation.  Patient has developed increased work of breathing for last 1 month.  Her oxygen requirement has increased to 4 L at home.  She follows up with pulmonologist outpatient and on low-dose prednisone and azithromycin 3 times weekly.  Chest x-ray no acute infiltrate.  Patient is admitted for further evaluation.  Assessment & Plan:   Principal Problem:   Acute on chronic respiratory failure with hypoxia (HCC) Active Problems:   Hyponatremia   Coronary artery disease   Carotid stenosis   (HFpEF) heart failure with preserved ejection fraction (HCC)  Acute on chronic hypoxic respiratory failure: COPD exacerbation: Patient presented with decompensated respiratory failure in the setting of baseline COPD and chronic oxygen use. She is found to have increased wheezing, sputum production and cough that is intermittent for 4 weeks. Her oxygen requirement has increased to 4 L. On prednisone chronically as well as 3 times weekly azithromycin. Chest x-ray relatively stable. Continue IV Solu-Medrol, DuoNeb scheduled and as needed. Continue IV cefepime for Pseudomonas coverage. Pulmonology is consulted, awaiting recommendation.  Chronic diastolic CHF 02/2017 Echo: LVEF 55-60%, gr1 DD  Patient appears euvolemic. Gentle IVF hydration , monitor volume status   Carotid stenosis: She has followed up with Dr. Wyn Quaker 08/2022 Duplex showed a patent right carotid endarterectomy and stable 40-59% left ICA stenosis  Continue home regimen including Plavix   Coronary artery disease: Cardiac  catheterization 02/2017 showed severe one-vessel CAD with chronically occluded RCA with well developed left-to-right collaterals  Denies any active chest pain. Troponin negative x 1 EKG stable Noted ASA intolerance- on plavix  Continue home regimen   Hyponatremia: Sodium 128 on presentation Looks to be acute on chronic issue with sodium baseline around 133 Patient appears clinically dry Continue IV fluid hydration Trend sodium Follow closely   DVT prophylaxis:  Lovenox Code Status: Full code Family Communication:No family at bed side. Disposition Plan:     Status is: Inpatient Remains inpatient appropriate because: Admitted for acute on chronic hypoxic respiratory secondary to COPD exacerbation.   Consultants:  Pulmonology  Procedures: None  Antimicrobials: Anti-infectives (From admission, onward)    Start     Dose/Rate Route Frequency Ordered Stop   11/25/22 1400  ceFEPIme (MAXIPIME) 2 g in sodium chloride 0.9 % 100 mL IVPB  Status:  Discontinued        2 g 200 mL/hr over 30 Minutes Intravenous Every 8 hours 11/25/22 1312 11/25/22 1315   11/25/22 1330  ceFEPIme (MAXIPIME) 2 g in sodium chloride 0.9 % 100 mL IVPB        2 g 200 mL/hr over 30 Minutes Intravenous Every 24 hours 11/25/22 1316 11/30/22 1329      Subjective: Patient was seen and examined at bedside.  Overnight events noted. Patient appears weak and deconditioned. She reports having worsening shortness of breath for last couple of weeks.  Objective: Vitals:   11/25/22 2318 11/26/22 0824 11/26/22 1008 11/26/22 1334  BP: (!) 147/74 (!) 143/63  (!) 134/58  Pulse: 92 82    Resp: 20 18    Temp: 98.2 F (36.8 C) 98.2 F (36.8 C)    TempSrc:  Oral Oral    SpO2:  100% 100%   Weight:      Height:        Intake/Output Summary (Last 24 hours) at 11/26/2022 1357 Last data filed at 11/26/2022 0300 Gross per 24 hour  Intake 535.01 ml  Output --  Net 535.01 ml   Filed Weights   11/25/22 0934 11/25/22 1446   Weight: 31.3 kg 29.8 kg    Examination:  General exam: Appears calm, comfortable, deconditioned, not in any distress. Respiratory system: CTA bilaterally,  respiratory effort normal.  RR 15 Cardiovascular system: S1 & S2 heard, regular rate and rhythm, no murmur. Gastrointestinal system: Abdomen is soft, non tender, non distended, BS+ Central nervous system: Alert and oriented x 3. No focal neurological deficits. Extremities: No edema, no cyanosis, no clubbing Skin: No rashes, lesions or ulcers Psychiatry: Mood & affect appropriate.     Data Reviewed: I have personally reviewed following labs and imaging studies  CBC: Recent Labs  Lab 11/25/22 0953 11/26/22 0306  WBC 6.4 3.8*  NEUTROABS 4.5  --   HGB 12.8 12.4  HCT 37.4 36.1  MCV 90.3 89.6  PLT 199 201   Basic Metabolic Panel: Recent Labs  Lab 11/25/22 0953 11/25/22 1437 11/25/22 1853 11/26/22 0306  NA 128*  --  125* 127*  K 3.2*  --   --  4.4  CL 87*  --   --  89*  CO2 31  --   --  29  GLUCOSE 100*  --   --  135*  BUN 12  --   --  21  CREATININE 0.65  --   --  0.73  CALCIUM 9.1  --   --  9.5  MG  --  1.7  --   --    GFR: Estimated Creatinine Clearance: 26.4 mL/min (by C-G formula based on SCr of 0.73 mg/dL). Liver Function Tests: Recent Labs  Lab 11/26/22 0306  AST 44*  ALT 29  ALKPHOS 74  BILITOT 0.7  PROT 7.4  ALBUMIN 4.3   No results for input(s): "LIPASE", "AMYLASE" in the last 168 hours. No results for input(s): "AMMONIA" in the last 168 hours. Coagulation Profile: No results for input(s): "INR", "PROTIME" in the last 168 hours. Cardiac Enzymes: No results for input(s): "CKTOTAL", "CKMB", "CKMBINDEX", "TROPONINI" in the last 168 hours. BNP (last 3 results) No results for input(s): "PROBNP" in the last 8760 hours. HbA1C: No results for input(s): "HGBA1C" in the last 72 hours. CBG: No results for input(s): "GLUCAP" in the last 168 hours. Lipid Profile: No results for input(s): "CHOL",  "HDL", "LDLCALC", "TRIG", "CHOLHDL", "LDLDIRECT" in the last 72 hours. Thyroid Function Tests: No results for input(s): "TSH", "T4TOTAL", "FREET4", "T3FREE", "THYROIDAB" in the last 72 hours. Anemia Panel: No results for input(s): "VITAMINB12", "FOLATE", "FERRITIN", "TIBC", "IRON", "RETICCTPCT" in the last 72 hours. Sepsis Labs: No results for input(s): "PROCALCITON", "LATICACIDVEN" in the last 168 hours.  Recent Results (from the past 240 hour(s))  Resp panel by RT-PCR (RSV, Flu A&B, Covid) Anterior Nasal Swab     Status: None   Collection Time: 11/25/22 11:00 AM   Specimen: Anterior Nasal Swab  Result Value Ref Range Status   SARS Coronavirus 2 by RT PCR NEGATIVE NEGATIVE Final    Comment: (NOTE) SARS-CoV-2 target nucleic acids are NOT DETECTED.  The SARS-CoV-2 RNA is generally detectable in upper respiratory specimens during the acute phase of infection. The lowest concentration of SARS-CoV-2 viral copies this assay can detect is  138 copies/mL. A negative result does not preclude SARS-Cov-2 infection and should not be used as the sole basis for treatment or other patient management decisions. A negative result may occur with  improper specimen collection/handling, submission of specimen other than nasopharyngeal swab, presence of viral mutation(s) within the areas targeted by this assay, and inadequate number of viral copies(<138 copies/mL). A negative result must be combined with clinical observations, patient history, and epidemiological information. The expected result is Negative.  Fact Sheet for Patients:  BloggerCourse.com  Fact Sheet for Healthcare Providers:  SeriousBroker.it  This test is no t yet approved or cleared by the [place] FDA and  has been authorized for detection and/or diagnosis of SARS-CoV-2 by FDA under an Emergency Use Authorization (EUA). This EUA will remain  in effect (meaning this test can be  used) for the duration of the COVID-19 declaration under Section 564(b)(1) of the Act, 21 U.S.C.section 360bbb-3(b)(1), unless the authorization is terminated  or revoked sooner.       Influenza A by PCR NEGATIVE NEGATIVE Final   Influenza B by PCR NEGATIVE NEGATIVE Final    Comment: (NOTE) The Xpert Xpress SARS-CoV-2/FLU/RSV plus assay is intended as an aid in the diagnosis of influenza from Nasopharyngeal swab specimens and should not be used as a sole basis for treatment. Nasal washings and aspirates are unacceptable for Xpert Xpress SARS-CoV-2/FLU/RSV testing.  Fact Sheet for Patients: BloggerCourse.com  Fact Sheet for Healthcare Providers: SeriousBroker.it  This test is not yet approved or cleared by the [place] FDA and has been authorized for detection and/or diagnosis of SARS-CoV-2 by FDA under an Emergency Use Authorization (EUA). This EUA will remain in effect (meaning this test can be used) for the duration of the COVID-19 declaration under Section 564(b)(1) of the Act, 21 U.S.C. section 360bbb-3(b)(1), unless the authorization is terminated or revoked.     Resp Syncytial Virus by PCR NEGATIVE NEGATIVE Final    Comment: (NOTE) Fact Sheet for Patients: BloggerCourse.com  Fact Sheet for Healthcare Providers: SeriousBroker.it  This test is not yet approved or cleared by the [place] FDA and has been authorized for detection and/or diagnosis of SARS-CoV-2 by FDA under an Emergency Use Authorization (EUA). This EUA will remain in effect (meaning this test can be used) for the duration of the COVID-19 declaration under Section 564(b)(1) of the Act, 21 U.S.C. section 360bbb-3(b)(1), unless the authorization is terminated or revoked.  Performed at [facility], [address], [place], [place] [address]    Radiology Studies: DG Chest 2  View  Result Date: 11/25/2022 CLINICAL DATA:  Shortness of breath EXAM: CHEST - 2 VIEW COMPARISON:  09/04/2022 FINDINGS: Normal heart size and mediastinal contours. Large lung volumes. No acute infiltrate or edema. No effusion or pneumothorax. No acute osseous findings. IMPRESSION: No active cardiopulmonary disease. Electronically Signed   By: Tiburcio Pea M.D.   On: 11/25/2022 10:28    Scheduled Meds:  amLODipine  5 mg Oral Daily   bisoprolol  5 mg Oral Daily   budesonide (PULMICORT) nebulizer solution  0.25 mg Nebulization BID   clopidogrel  75 mg Oral Daily   enoxaparin (LOVENOX) injection  30 mg Subcutaneous Q24H   ipratropium-albuterol  3 mL Nebulization Q4H   pantoprazole  40 mg Oral Daily   predniSONE  40 mg Oral Q breakfast   Continuous Infusions:  sodium chloride 75 mL/hr at 11/25/22 1454   ceFEPime (MAXIPIME) IV 2 g (11/26/22 1333)     LOS: 1 day  Time spent: 50 mins    Willeen Niece, MD [facility]   If 7PM-7AM, please contact night-coverage

## 2022-11-27 DIAGNOSIS — B348 Other viral infections of unspecified site: Secondary | ICD-10-CM

## 2022-11-27 DIAGNOSIS — Z515 Encounter for palliative care: Secondary | ICD-10-CM | POA: Diagnosis not present

## 2022-11-27 DIAGNOSIS — J441 Chronic obstructive pulmonary disease with (acute) exacerbation: Secondary | ICD-10-CM | POA: Diagnosis not present

## 2022-11-27 DIAGNOSIS — J9621 Acute and chronic respiratory failure with hypoxia: Secondary | ICD-10-CM | POA: Diagnosis not present

## 2022-11-27 LAB — CBC
HCT: 35.6 % — ABNORMAL LOW (ref 36.0–46.0)
Hemoglobin: 12.3 g/dL (ref 12.0–15.0)
MCH: 30.9 pg (ref 26.0–34.0)
MCHC: 34.6 g/dL (ref 30.0–36.0)
MCV: 89.4 fL (ref 80.0–100.0)
Platelets: 187 10*3/uL (ref 150–400)
RBC: 3.98 MIL/uL (ref 3.87–5.11)
RDW: 12.2 % (ref 11.5–15.5)
WBC: 5.2 10*3/uL (ref 4.0–10.5)
nRBC: 0 % (ref 0.0–0.2)

## 2022-11-27 LAB — BASIC METABOLIC PANEL
Anion gap: 9 (ref 5–15)
BUN: 16 mg/dL (ref 8–23)
CO2: 33 mmol/L — ABNORMAL HIGH (ref 22–32)
Calcium: 9.1 mg/dL (ref 8.9–10.3)
Chloride: 86 mmol/L — ABNORMAL LOW (ref 98–111)
Creatinine, Ser: 0.69 mg/dL (ref 0.44–1.00)
GFR, Estimated: >60 mL/min (ref >60)
Glucose, Bld: 96 mg/dL (ref 70–99)
Potassium: 4.1 mmol/L (ref 3.5–5.1)
Sodium: 128 mmol/L — ABNORMAL LOW (ref 135–145)

## 2022-11-27 LAB — PHOSPHORUS: Phosphorus: 2.9 mg/dL (ref 2.5–4.6)

## 2022-11-27 LAB — MAGNESIUM: Magnesium: 1.9 mg/dL (ref 1.7–2.4)

## 2022-11-27 MED ORDER — GUAIFENESIN-DM 100-10 MG/5ML PO SYRP
5.0000 mL | ORAL_SOLUTION | ORAL | Status: DC | PRN
  Administered 2022-11-27: 5 mL via ORAL
  Filled 2022-11-27: qty 10

## 2022-11-27 MED ORDER — IPRATROPIUM-ALBUTEROL 0.5-2.5 (3) MG/3ML IN SOLN
3.0000 mL | Freq: Four times a day (QID) | RESPIRATORY_TRACT | Status: DC
  Administered 2022-11-27 – 2022-11-29 (×8): 3 mL via RESPIRATORY_TRACT
  Filled 2022-11-27 (×8): qty 3

## 2022-11-27 MED ORDER — SALINE SPRAY 0.65 % NA SOLN
1.0000 | NASAL | Status: DC | PRN
  Administered 2022-11-27: 1 via NASAL
  Filled 2022-11-27: qty 44

## 2022-11-27 NOTE — Progress Notes (Signed)
PULMONOLOGY         Date: 11/27/2022,   MRN# [id number] Christina Hester 1941-01-09     AdmissionWeight: 31.3 kg                 CurrentWeight: 29.8 kg  Referring provider: Dr Idelle Leech   CHIEF COMPLAINT:   Acute on chronic hypoxemic respiratory failure   HISTORY OF PRESENT ILLNESS   [name] with hx of advanced copd and chronic hypoxemia with 2L/min [place].  She shares over past week prior to admission she progressively deteriorated at home with chest discomfort, cough and worsening dyspnea.  She denies having febrile illness.  She does take prednisone chronically.  She was admitted with severe COPD exacerbation with findings of heavy inspissated phlegm on expectoration. She required 4L/min to reach 88% on spO2.  She is slightly improved now. She has not been able to eat well due to feeling uwell.  She did get up oob to chair today to go to bathroom and sit up by window to watch TV.  She is exhausted after few minutes of light activity.   11/27/22- patient complains of pain with coughing.  She actually appears more stable and improved overall this morning but does complain of tenderness at bottom of both lungs worse with forceful cough.  PARAINFLUENZA #3 is positive as etiology of viral disease causing Acute exacerbation of COPD.    PAST MEDICAL HISTORY   Past Medical History:  Diagnosis Date   AAA (abdominal aortic aneurysm) (HCC)    a. 05/2016 Abd U/S: 3.02 x 3.02 AAA.   Anxiety    CAD (coronary artery disease)    a. 02/2017 NSTEMI/Cath: LM nl, LAD mild dzs, D2 min irregs, LCX mild dzs, OM2 50ost, RCA 95p, 113m, L-->R collats, EF 55-65%.   COPD (chronic obstructive pulmonary disease) (HCC)    Diastolic dysfunction    a. 02/2017 Echo: EF 55-60%, gr1 DD, ? inf HK, mild MR.   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 05/2016 ABI's: R  1.22, L 1.11.   Tobacco abuse      SURGICAL HISTORY   Past Surgical History:  Procedure Laterality Date   ABDOMINAL HYSTERECTOMY     APPENDECTOMY     BREAST EXCISIONAL BIOPSY Left 80's or 90's   NEG   CARDIAC CATHETERIZATION     COLONOSCOPY N/A 05/22/2018   Procedure: COLONOSCOPY;  Surgeon: Toney Reil, MD;  Location: [facility];  Service: Gastroenterology;  Laterality: N/A;   COLONOSCOPY WITH PROPOFOL N/A 07/19/2020   Procedure: COLONOSCOPY WITH PROPOFOL;  Surgeon: Toledo, Boykin Nearing, MD;  Location: [facility] ENDOSCOPY;  Service: Gastroenterology;  Laterality: N/A;   ENDARTERECTOMY Right 11/06/2019   Procedure: ENDARTERECTOMY CAROTID;  Surgeon: Annice Needy, MD;  Location: [facility] ORS;  Service: Vascular;  Laterality: Right;   LEFT HEART CATH Bilateral 03/05/2017   Procedure: Left Heart Cath with poss PCI;  Surgeon: Iran Ouch, MD;  Location: [facility];  Service: Cardiovascular;  Laterality: Bilateral;     FAMILY HISTORY   Family History  Problem Relation Age of Onset   Breast cancer Mother    Dementia Mother    Hypertension Mother    Breast cancer Maternal Aunt    Breast cancer Maternal Aunt  Breast cancer Maternal Aunt    CVA Father      SOCIAL HISTORY   Social History   Tobacco Use   Smoking status: Former    Packs/day: .25    Types: Cigarettes    Quit date: 07/12/2017    Years since quitting: 5.3   Smokeless tobacco: Never   Tobacco comments:    currently 90 days without smoking  Vaping Use   Vaping Use: Never used  Substance Use Topics   Alcohol use: No   Drug use: No     MEDICATIONS    Home Medication:    Current Medication:  Current Facility-Administered Medications:    0.9 %  sodium chloride infusion, , Intravenous, Continuous, Floydene Flock, MD, Last Rate: 75 mL/hr at 11/25/22 1454, New Bag at 11/25/22 1454   acetaminophen (TYLENOL) tablet 650 mg, 650 mg, Oral, Q6H PRN, Willeen Niece, MD, 650 mg at 11/27/22 0030    amLODipine (NORVASC) tablet 5 mg, 5 mg, Oral, Daily, Floydene Flock, MD, 5 mg at 11/26/22 0912   azithromycin (ZITHROMAX) tablet 250 mg, 250 mg, Oral, Daily, Vida Rigger, MD, 250 mg at 11/26/22 1509   bisoprolol (ZEBETA) tablet 5 mg, 5 mg, Oral, Daily, Floydene Flock, MD, 5 mg at 11/26/22 0912   budesonide (PULMICORT) nebulizer solution 0.25 mg, 0.25 mg, Nebulization, BID, Manuela Schwartz, NP, 0.25 mg at 11/27/22 0731   clopidogrel (PLAVIX) tablet 75 mg, 75 mg, Oral, Daily, Floydene Flock, MD, 75 mg at 11/26/22 0912   enoxaparin (LOVENOX) injection 30 mg, 30 mg, Subcutaneous, Q24H, Hallaji, Sheema M, RPH   guaiFENesin-dextromethorphan (ROBITUSSIN DM) 100-10 MG/5ML syrup 5 mL, 5 mL, Oral, Q4H PRN, Idelle Leech, Pardeep, MD, 5 mL at 11/27/22 0402   ipratropium-albuterol (DUONEB) 0.5-2.5 (3) MG/3ML nebulizer solution 3 mL, 3 mL, Nebulization, Q4H PRN, Floydene Flock, MD, 3 mL at 11/27/22 0043   ipratropium-albuterol (DUONEB) 0.5-2.5 (3) MG/3ML nebulizer solution 3 mL, 3 mL, Nebulization, Q4H, Manuela Schwartz, NP, 3 mL at 11/27/22 0731   LORazepam (ATIVAN) tablet 0.5 mg, 0.5 mg, Oral, Q6H PRN, Floydene Flock, MD, 0.5 mg at 11/26/22 2109   nitroGLYCERIN (NITROSTAT) SL tablet 0.4 mg, 0.4 mg, Sublingual, Q5 min PRN, Floydene Flock, MD   nystatin (MYCOSTATIN) 100000 UNIT/ML suspension 500,000 Units, 5 mL, Oral, PRN, Floydene Flock, MD   ondansetron (ZOFRAN) tablet 4 mg, 4 mg, Oral, Q6H PRN **OR** ondansetron (ZOFRAN) injection 4 mg, 4 mg, Intravenous, Q6H PRN, Floydene Flock, MD   pantoprazole (PROTONIX) EC tablet 40 mg, 40 mg, Oral, Daily, Floydene Flock, MD, 40 mg at 11/26/22 0912   [COMPLETED] methylPREDNISolone sodium succinate (SOLU-MEDROL) 40 mg/mL injection 40 mg, 40 mg, Intravenous, Q24H, 40 mg at 11/25/22 1504 **FOLLOWED BY** predniSONE (DELTASONE) tablet 40 mg, 40 mg, Oral, Q breakfast, Floydene Flock, MD, 40 mg at 11/26/22 1610   sodium chloride (OCEAN) 0.65 % nasal spray 1  spray, 1 spray, Each Nare, PRN, Willeen Niece, MD, 1 spray at 11/27/22 0402    ALLERGIES   Hydrocodone-acetaminophen, Aspirin, Budesonide-formoterol fumarate, Prednisone, Sulfa antibiotics, and Tramadol     REVIEW OF SYSTEMS    Review of Systems:  Gen:  Denies  fever, sweats, chills weigh loss  HEENT: Denies blurred vision, double vision, ear pain, eye pain, hearing loss, nose bleeds, sore throat Cardiac:  No dizziness, chest pain or heaviness, chest tightness,edema Resp:   reports dyspnea chronically  Gi: Denies swallowing difficulty, stomach pain, nausea or vomiting, diarrhea, constipation, bowel  incontinence Gu:  Denies bladder incontinence, burning urine Ext:   Denies Joint pain, stiffness or swelling Skin: Denies  skin rash, easy bruising or bleeding or hives Endoc:  Denies polyuria, polydipsia , polyphagia or weight change Psych:   Denies depression, insomnia or hallucinations   Other:  All other systems negative   VS: BP (!) 140/87 (BP Location: Left Arm)   Pulse 94   Temp (!) 97.5 F (36.4 C)   Resp 16   Ht 5' (1.524 m)   Wt 29.8 kg   SpO2 95%   BMI 12.83 kg/m      PHYSICAL EXAM    GENERAL:NAD, no fevers, chills, no weakness no fatigue HEAD: Normocephalic, atraumatic.  EYES: Pupils equal, round, reactive to light. Extraocular muscles intact. No scleral icterus.  MOUTH: Moist mucosal membrane. Dentition intact. No abscess noted.  EAR, NOSE, THROAT: Clear without exudates. No external lesions.  NECK: Supple. No thyromegaly. No nodules. No JVD.  PULMONARY: decreased breath sounds with mild rhonchi worse at bases bilaterally.  CARDIOVASCULAR: S1 and S2. Regular rate and rhythm. No murmurs, rubs, or gallops. No edema. Pedal pulses 2+ bilaterally.  GASTROINTESTINAL: Soft, nontender, nondistended. No masses. Positive bowel sounds. No hepatosplenomegaly.  MUSCULOSKELETAL: No swelling, clubbing, or edema. Range of motion full in all extremities.  NEUROLOGIC:  Cranial nerves II through XII are intact. No gross focal neurological deficits. Sensation intact. Reflexes intact.  SKIN: No ulceration, lesions, rashes, or cyanosis. Skin warm and dry. Turgor intact.  PSYCHIATRIC: Mood, affect within normal limits. The patient is awake, alert and oriented x 3. Insight, judgment intact.       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  - Patient is on cefepime and solumedrol, will reduce to zithromax po and prednisone 50mg  with tapering -RVP for etiology of current exacerbation  -DC pulmicort  -continue duoneb -PT/OT and IS -reviewed plan with RN and patient             Thank you for allowing me to participate in the care of this patient.   Patient/Family are satisfied with care plan and all questions have been answered.    Provider disclosure: Patient with at least one acute or chronic illness or injury that poses a threat to life or bodily function and is being managed actively during this encounter.  All of the below services have been performed independently by signing provider:  review of prior documentation from internal and or external health records.  Review of previous and current lab results.  Interview and comprehensive assessment during patient visit today. Review of current and previous chest radiographs/CT scans. Discussion of management and test interpretation with health care team and patient/family.   This document was prepared using Dragon voice recognition software and may include unintentional dictation errors.     Vida Rigger, M.D.  Division of Pulmonary & Critical Care Medicine

## 2022-11-27 NOTE — Progress Notes (Signed)
PROGRESS NOTE    Christina Hester  ZOX:[id number] DOB: 10/24/1940 DOA: 11/25/2022  PCP: Sallyanne Kuster, NP   Brief Narrative:  This 82 yrs old female with PMH significant of chronic hypoxic respiratory failure on 2 L, COPD on chronic prednisone, diastolic dysfunction, carotid artery stenosis, peripheral artery disease presented in the ED with acute on chronic respiratory failure with hypoxia, COPD exacerbation.  Patient has developed increased work of breathing for last 1 month.  Her oxygen requirement has increased to 4 L at home.  She follows up with pulmonologist outpatient and on low-dose prednisone and azithromycin 3 times weekly.  Chest x-ray no acute infiltrate.  Patient is admitted for further evaluation.  Assessment & Plan:   Principal Problem:   Acute on chronic respiratory failure with hypoxia (HCC) Active Problems:   Hyponatremia   Coronary artery disease   Carotid stenosis   (HFpEF) heart failure with preserved ejection fraction (HCC)  Acute on chronic hypoxic respiratory failure: COPD exacerbation: Patient presented with decompensated respiratory failure in the setting of baseline COPD and chronic oxygen use. She is found to have increased wheezing, sputum production and cough that is intermittent for 4 weeks. Her oxygen requirement has increased to 4 L. On prednisone chronically as well as 3 times weekly azithromycin. Chest x-ray relatively stable. Respiratory viral panel parainfluenza 3 likely because of COPD exacerbation. Initiated on IV Solu-Medrol, now changed to prednisone. Initiated on IV cefepime for Pseudomonas coverage. Pulmonology is consulted, antibiotic changed to azithromycin.  Chronic diastolic CHF 02/2017 Echo: LVEF 55-60%, gr1 DD  Patient appears euvolemic. Gentle IVF hydration , monitor volume status.   Carotid stenosis: She has followed up with Dr. Wyn Quaker 08/2022 Duplex showed a patent right carotid endarterectomy and stable 40-59% left ICA  stenosis  Continue home regimen including Plavix   Coronary artery disease: Cardiac catheterization 02/2017 showed severe one-vessel CAD with chronically occluded RCA with well developed left-to-right collaterals  Denies any active chest pain. Troponin negative x 2 EKG stable Noted ASA intolerance- on plavix  Continue home regimen   Hyponatremia: Sodium 128 on presentation Looks to be acute on chronic issue with sodium baseline around 133 Patient appears clinically dry , Continue IV fluid hydration Trend sodium    DVT prophylaxis:  Lovenox Code Status: Full code Family Communication:No family at bed side. Disposition Plan:     Status is: Inpatient Remains inpatient appropriate because: Admitted for acute on chronic hypoxic respiratory secondary to COPD exacerbation. PT and OT evaluation, anticipated discharge  in 1 to 2 days.   Consultants:  Pulmonology  Procedures: None  Antimicrobials: Anti-infectives (From admission, onward)    Start     Dose/Rate Route Frequency Ordered Stop   11/26/22 1500  azithromycin (ZITHROMAX) tablet 250 mg        250 mg Oral Daily 11/26/22 1410     11/25/22 1400  ceFEPIme (MAXIPIME) 2 g in sodium chloride 0.9 % 100 mL IVPB  Status:  Discontinued        2 g 200 mL/hr over 30 Minutes Intravenous Every 8 hours 11/25/22 1312 11/25/22 1315   11/25/22 1330  ceFEPIme (MAXIPIME) 2 g in sodium chloride 0.9 % 100 mL IVPB  Status:  Discontinued        2 g 200 mL/hr over 30 Minutes Intravenous Every 24 hours 11/25/22 1316 11/26/22 1410      Subjective: Patient was seen and examined at bedside.  Overnight events noted. Patient appears very weak and deconditioned.  She reports persistent cough.  She reports overall getting better.  Objective: Vitals:   11/26/22 2351 11/27/22 0035 11/27/22 0333 11/27/22 0802  BP: (!) 126/50 (!) 158/72  (!) 140/87  Pulse: 78 93  94  Resp:    16  Temp:  98.4 F (36.9 C)  (!) 97.5 F (36.4 C)  TempSrc:  Axillary     SpO2:  98% 97% 95%  Weight:      Height:        Intake/Output Summary (Last 24 hours) at 11/27/2022 1146 Last data filed at 11/27/2022 0404 Gross per 24 hour  Intake 800 ml  Output --  Net 800 ml   Filed Weights   11/25/22 0934 11/25/22 1446  Weight: 31.3 kg 29.8 kg    Examination:  General exam: Appears comfortable, deconditioned, not in any acute distress. Respiratory system: CTA bilaterally,  respiratory effort normal.  RR 14 Cardiovascular system: S1 & S2 heard, regular rate and rhythm, no murmur. Gastrointestinal system: Abdomen is soft, non tender, non distended, BS+ Central nervous system: Alert and oriented x 3. No focal neurological deficits. Extremities: No edema, no cyanosis, no clubbing Skin: No rashes, lesions or ulcers Psychiatry: Mood & affect appropriate.     Data Reviewed: I have personally reviewed following labs and imaging studies  CBC: Recent Labs  Lab 11/25/22 0953 11/26/22 0306 11/27/22 0401  WBC 6.4 3.8* 5.2  NEUTROABS 4.5  --   --   HGB 12.8 12.4 12.3  HCT 37.4 36.1 35.6*  MCV 90.3 89.6 89.4  PLT 199 201 187   Basic Metabolic Panel: Recent Labs  Lab 11/25/22 0953 11/25/22 1437 11/25/22 1853 11/26/22 0306 11/27/22 0401  NA 128*  --  125* 127* 128*  K 3.2*  --   --  4.4 4.1  CL 87*  --   --  89* 86*  CO2 31  --   --  29 33*  GLUCOSE 100*  --   --  135* 96  BUN 12  --   --  21 16  CREATININE 0.65  --   --  0.73 0.69  CALCIUM 9.1  --   --  9.5 9.1  MG  --  1.7  --   --  1.9  PHOS  --   --   --   --  2.9   GFR: Estimated Creatinine Clearance: 26.4 mL/min (by C-G formula based on SCr of 0.69 mg/dL). Liver Function Tests: Recent Labs  Lab 11/26/22 0306  AST 44*  ALT 29  ALKPHOS 74  BILITOT 0.7  PROT 7.4  ALBUMIN 4.3   No results for input(s): "LIPASE", "AMYLASE" in the last 168 hours. No results for input(s): "AMMONIA" in the last 168 hours. Coagulation Profile: No results for input(s): "INR", "PROTIME" in the last 168  hours. Cardiac Enzymes: No results for input(s): "CKTOTAL", "CKMB", "CKMBINDEX", "TROPONINI" in the last 168 hours. BNP (last 3 results) No results for input(s): "PROBNP" in the last 8760 hours. HbA1C: No results for input(s): "HGBA1C" in the last 72 hours. CBG: No results for input(s): "GLUCAP" in the last 168 hours. Lipid Profile: No results for input(s): "CHOL", "HDL", "LDLCALC", "TRIG", "CHOLHDL", "LDLDIRECT" in the last 72 hours. Thyroid Function Tests: No results for input(s): "TSH", "T4TOTAL", "FREET4", "T3FREE", "THYROIDAB" in the last 72 hours. Anemia Panel: No results for input(s): "VITAMINB12", "FOLATE", "FERRITIN", "TIBC", "IRON", "RETICCTPCT" in the last 72 hours. Sepsis Labs: No results for input(s): "PROCALCITON", "LATICACIDVEN" in the last 168 hours.  Recent Results (from the past 240  hour(s))  Resp panel by RT-PCR (RSV, Flu A&B, Covid) Anterior Nasal Swab     Status: None   Collection Time: 11/25/22 11:00 AM   Specimen: Anterior Nasal Swab  Result Value Ref Range Status   SARS Coronavirus 2 by RT PCR NEGATIVE NEGATIVE Final    Comment: (NOTE) SARS-CoV-2 target nucleic acids are NOT DETECTED.  The SARS-CoV-2 RNA is generally detectable in upper respiratory specimens during the acute phase of infection. The lowest concentration of SARS-CoV-2 viral copies this assay can detect is 138 copies/mL. A negative result does not preclude SARS-Cov-2 infection and should not be used as the sole basis for treatment or other patient management decisions. A negative result may occur with  improper specimen collection/handling, submission of specimen other than nasopharyngeal swab, presence of viral mutation(s) within the areas targeted by this assay, and inadequate number of viral copies(<138 copies/mL). A negative result must be combined with clinical observations, patient history, and epidemiological information. The expected result is Negative.  Fact Sheet for Patients:   BloggerCourse.com  Fact Sheet for Healthcare Providers:  SeriousBroker.it  This test is no t yet approved or cleared by the [place] FDA and  has been authorized for detection and/or diagnosis of SARS-CoV-2 by FDA under an Emergency Use Authorization (EUA). This EUA will remain  in effect (meaning this test can be used) for the duration of the COVID-19 declaration under Section 564(b)(1) of the Act, 21 U.S.C.section 360bbb-3(b)(1), unless the authorization is terminated  or revoked sooner.       Influenza A by PCR NEGATIVE NEGATIVE Final   Influenza B by PCR NEGATIVE NEGATIVE Final    Comment: (NOTE) The Xpert Xpress SARS-CoV-2/FLU/RSV plus assay is intended as an aid in the diagnosis of influenza from Nasopharyngeal swab specimens and should not be used as a sole basis for treatment. Nasal washings and aspirates are unacceptable for Xpert Xpress SARS-CoV-2/FLU/RSV testing.  Fact Sheet for Patients: BloggerCourse.com  Fact Sheet for Healthcare Providers: SeriousBroker.it  This test is not yet approved or cleared by the [place] FDA and has been authorized for detection and/or diagnosis of SARS-CoV-2 by FDA under an Emergency Use Authorization (EUA). This EUA will remain in effect (meaning this test can be used) for the duration of the COVID-19 declaration under Section 564(b)(1) of the Act, 21 U.S.C. section 360bbb-3(b)(1), unless the authorization is terminated or revoked.     Resp Syncytial Virus by PCR NEGATIVE NEGATIVE Final    Comment: (NOTE) Fact Sheet for Patients: BloggerCourse.com  Fact Sheet for Healthcare Providers: SeriousBroker.it  This test is not yet approved or cleared by the [place] FDA and has been authorized for detection and/or diagnosis of SARS-CoV-2 by FDA under an Emergency Use  Authorization (EUA). This EUA will remain in effect (meaning this test can be used) for the duration of the COVID-19 declaration under Section 564(b)(1) of the Act, 21 U.S.C. section 360bbb-3(b)(1), unless the authorization is terminated or revoked.  Performed at [facility], [address] [place], [place] [address]   Respiratory (~20 pathogens) panel by PCR     Status: Abnormal   Collection Time: 11/26/22  2:33 PM   Specimen: Nasopharyngeal Swab; Respiratory  Result Value Ref Range Status   Adenovirus NOT DETECTED NOT DETECTED Final   Coronavirus 229E NOT DETECTED NOT DETECTED Final    Comment: (NOTE) The Coronavirus on the Respiratory Panel, DOES NOT test for the novel  Coronavirus (2019 nCoV)    Coronavirus HKU1 NOT DETECTED NOT DETECTED  Final   Coronavirus NL63 NOT DETECTED NOT DETECTED Final   Coronavirus OC43 NOT DETECTED NOT DETECTED Final   Metapneumovirus NOT DETECTED NOT DETECTED Final   Rhinovirus / Enterovirus NOT DETECTED NOT DETECTED Final   Influenza A NOT DETECTED NOT DETECTED Final   Influenza B NOT DETECTED NOT DETECTED Final   Parainfluenza Virus 1 NOT DETECTED NOT DETECTED Final   Parainfluenza Virus 2 NOT DETECTED NOT DETECTED Final   Parainfluenza Virus 3 DETECTED (A) NOT DETECTED Final   Parainfluenza Virus 4 NOT DETECTED NOT DETECTED Final   Respiratory Syncytial Virus NOT DETECTED NOT DETECTED Final   Bordetella pertussis NOT DETECTED NOT DETECTED Final   Bordetella Parapertussis NOT DETECTED NOT DETECTED Final   Chlamydophila pneumoniae NOT DETECTED NOT DETECTED Final   Mycoplasma pneumoniae NOT DETECTED NOT DETECTED Final    Comment: Performed at [facility], [address] [place], [place] [address]   Radiology Studies: No results found.  Scheduled Meds:  amLODipine  5 mg Oral Daily   azithromycin  250 mg Oral Daily   bisoprolol  5 mg Oral Daily   budesonide (PULMICORT) nebulizer solution  0.25 mg Nebulization BID    clopidogrel  75 mg Oral Daily   enoxaparin (LOVENOX) injection  30 mg Subcutaneous Q24H   ipratropium-albuterol  3 mL Nebulization Q6H   pantoprazole  40 mg Oral Daily   predniSONE  40 mg Oral Q breakfast   Continuous Infusions:  sodium chloride 75 mL/hr at 11/25/22 1454     LOS: 2 days    Time spent: 35 mins    Willeen Niece, MD [facility]   If 7PM-7AM, please contact night-coverage

## 2022-11-27 NOTE — Consult Note (Signed)
Consultation Note Date: 11/27/2022 at 1150  Patient Name: Christina Hester  DOB: 05/20/1941  MRN: [id number]  Age / Sex: 82 y.o., female  PCP: Sallyanne Kuster, NP Referring Physician: Willeen Niece, MD  Reason for Consultation: Establishing goals of care  HPI/Patient Profile: 82 y.o. female  with past medical history of advanced COPD (2L [place] at baseline, chronic low-dose prednisone), AAA, CAD, anxiety, diastolic dysfunction, HLD, HTN, PAD, and current tobacco abuse (half pack per day) admitted on 11/25/2022 with chest discomfort, cough, and worsening dyspnea for 2 weeks.  Patient has been diagnosed with parainfluenza #3 as etiology of viral disease causing acute exacerbation of COPD.  ENT was consulted to discuss goals of care.   Clinical Assessment and Goals of Care: I have reviewed medical records including EPIC notes, labs and imaging, assessed the patient and then met with patient at bedside with her daughter Christina Hester on speaker phone to discuss diagnosis prognosis, GOC, EOL wishes, disposition and options.  I introduced Palliative Medicine as specialized medical care for people living with serious illness. It focuses on providing relief from the symptoms and stress of a serious illness. The goal is to improve quality of life for both the patient and the family.  As far as functional and nutritional status patient endorses she has significant difficulty over the past several months with feeling too tired.  She is no longer able to walk to her mailbox without getting winded.  She endorses low energy.  She also endorses sleeping more often during the day and having difficulty resting at night.  We discussed patient's current illness and what it means in the larger context of patient's on-going co-morbidities.  Education provided on COPD and parainfluenza #3.  We reviewed that COPD is a chronic and progressive  disease.  I also highlighted that parainfluenza is a respiratory virus that affects her airway and lungs,and differentiated it from "the flu".    Patient's daughter asked what antibiotics patient could take for parainfluenza.  Reviewed current treatment plan supportive measures for parainfluenza as it is a virus. Ongoing education needed.   I attempted to elicit values and goals of care important to the patient.  Patient states she wants to get well enough so that she can return home.  She shares she is feeling better but "not quite back to normal".  Advance directives, concepts specific to code status, artificial feeding and hydration, and rehospitalization were considered and discussed.  Patient states that she has a living will but that it needs to be rewritten.  Patient endorses that she is accepting of all offered, available, and appropriate medical interventions to sustain her life.  In the event that she suffers a cardiopulmonary arrest, patient would want CPR, defibrillation, and use of mechanical ventilation.    However, she endorses she would want this only so that her family could come and say goodbye and so she could be evaluated for organ donation.  Patient was adamant that she would not want to live long-term on life support - never  accepting of a trach/peg.  Daughter inquired about current medication regimen.  We reviewed both scheduled and PRN medications.   Symptoms assessed.  Patient has no acute complaints at this time.  No adjustments to medications needed.   Discussed with patient/family the importance of continued conversation with family and the medical providers regarding overall plan of care and treatment options, ensuring decisions are within the context of the patient's values and GOCs.    Questions and concerns were addressed. The family was encouraged to call with questions or concerns. PMT contact info given and patient/family encouraged to call with any palliative  concerns.   At patient's request, I attempted to speak with her son Christina Hester over the phone.  I called number listed in Griffin's wife Christina Hester answered the phone.  PMT contact info given and Christina Hester shares she would notify Christina Hester of my call.  PMT will continue to follow.  Primary Decision Maker PATIENT  Physical Exam Vitals reviewed.  Constitutional:      Comments: Thin  HENT:     Head: Normocephalic.  Cardiovascular:     Rate and Rhythm: Normal rate.  Pulmonary:     Breath sounds: Examination of the right-lower field reveals decreased breath sounds. Examination of the left-lower field reveals decreased breath sounds. Decreased breath sounds present.  Skin:    General: Skin is warm and dry.  Neurological:     Mental Status: She is alert and oriented to person, place, and time.  Psychiatric:        Mood and Affect: Mood is not anxious.        Behavior: Behavior is not agitated.     Palliative Assessment/Data: 60-70%     Thank you for this consult. Palliative medicine will continue to follow and assist holistically.   Time Total: 75 minutes Greater than 50%  of this time was spent counseling and coordinating care related to the above assessment and plan.  Signed by: Georgiann Cocker, DNP, FNP-BC Palliative Medicine    Please contact Palliative Medicine Team phone at [phone] for questions and concerns.  For individual provider: See Loretha Stapler

## 2022-11-27 NOTE — Plan of Care (Signed)
  Problem: Education: Goal: Knowledge of General Education information will improve Description: Including pain rating scale, medication(s)/side effects and non-pharmacologic comfort measures Outcome: Progressing   Problem: Clinical Measurements: Goal: Respiratory complications will improve Outcome: Progressing   Problem: Activity: Goal: Risk for activity intolerance will decrease Outcome: Progressing   Problem: Coping: Goal: Level of anxiety will decrease Outcome: Progressing   Problem: Pain Managment: Goal: General experience of comfort will improve Outcome: Progressing   Problem: Safety: Goal: Ability to remain free from injury will improve Outcome: Progressing   

## 2022-11-28 DIAGNOSIS — I5032 Chronic diastolic (congestive) heart failure: Secondary | ICD-10-CM

## 2022-11-28 DIAGNOSIS — J9621 Acute and chronic respiratory failure with hypoxia: Secondary | ICD-10-CM | POA: Diagnosis not present

## 2022-11-28 DIAGNOSIS — J441 Chronic obstructive pulmonary disease with (acute) exacerbation: Secondary | ICD-10-CM | POA: Diagnosis not present

## 2022-11-28 DIAGNOSIS — Z515 Encounter for palliative care: Secondary | ICD-10-CM | POA: Diagnosis not present

## 2022-11-28 LAB — BASIC METABOLIC PANEL
Anion gap: 7 (ref 5–15)
BUN: 23 mg/dL (ref 8–23)
CO2: 35 mmol/L — ABNORMAL HIGH (ref 22–32)
Calcium: 8.9 mg/dL (ref 8.9–10.3)
Chloride: 87 mmol/L — ABNORMAL LOW (ref 98–111)
Creatinine, Ser: 0.76 mg/dL (ref 0.44–1.00)
GFR, Estimated: >60 mL/min (ref >60)
Glucose, Bld: 90 mg/dL (ref 70–99)
Potassium: 3.5 mmol/L (ref 3.5–5.1)
Sodium: 129 mmol/L — ABNORMAL LOW (ref 135–145)

## 2022-11-28 LAB — MAGNESIUM: Magnesium: 2 mg/dL (ref 1.7–2.4)

## 2022-11-28 LAB — PHOSPHORUS: Phosphorus: 2.8 mg/dL (ref 2.5–4.6)

## 2022-11-28 NOTE — Consult Note (Signed)
[facility] Customer service manager [facility]) Accountable Care Organization (ACO) [facility] Liaison Note  11/28/2022  TALEIYAH GREN 1941-01-21 [id number]  Location: [facility] RN Hospital Liaison screened the patient remotely at [facility].  Insurance: MCR ACO   NIKYA BERGANZA is a 82 y.o. female who is a Optician, dispensing Care Patient of Tedd Sias, Arlyss Repress, NP Sander Radon). The patient was screened for readmission hospitalization with noted medium risk score for unplanned readmission risk with 1 IP/1 ED in 6 months.  The patient was assessed for potential [facility]) Care Management service needs for post hospital transition for care coordination. Review of patient's electronic medical record reveals patient was admitted with COPD. Attempted bedside visit however unable to enter the room. [facility] liaison spoke with pt telephonically and introduced [facility] services and benefits. Pt receptive to a post hospital prevention follow up call with no needs presented at this time. [facility] brochure, card and magnet provided to bedside nurse to give to pt with pt's permission.   Plan: [facility] Liaison will continue to follow progress and disposition to asess for post hospital community care coordination/management needs.  Referral request for community care coordination: anticipate [facility] Transitions of Care Team follow up.   [facility] Care Management/Population Health does not replace or interfere with any arrangements made by the Inpatient Transition of Care team.   For questions contact:   Elliot Cousin, RN, BSN [facility] Liaison [place]   [facility]  Population Health Office Hours MTWF 8:00 am to 6 pm off on Thursday [phone] mobile [phone] [Office toll free line][facility] Office Hours are M-F 8:30 - 5 pm 24 hour nurse advise line [phone] Conceirge  [email or web address]

## 2022-11-28 NOTE — Progress Notes (Signed)
Palliative Care Progress Note, Assessment & Plan   Patient Name: Christina Hester       Date: 11/28/2022 DOB: Aug 19, 1940  Age: 82 y.o. MRN#: [id number] Attending Physician: Enedina Finner, MD Primary Care Physician: Sallyanne Kuster, NP Admit Date: 11/25/2022  Subjective: Patient is sitting up in bed in no apparent distress.  Nasal cannula in place.  She acknowledges my presence and is able to make her wishes known.  She has no acute complaints today.  No family or friends present at bedside during my visit.  HPI: 82 y.o. female  with past medical history of advanced COPD (2L [place] at baseline, chronic low-dose prednisone), AAA, CAD, anxiety, diastolic dysfunction, HLD, HTN, PAD, and current tobacco abuse (half pack per day) admitted on 11/25/2022 with chest discomfort, cough, and worsening dyspnea for 2 weeks.   Patient has been diagnosed with parainfluenza #3 as etiology of viral disease causing acute exacerbation of COPD.   ENT was consulted to discuss goals of care.   Summary of counseling/coordination of care: After reviewing the patient's chart and assessing the patient at bedside, I spoke with patient in regards to symptom burden and goals of care.  Symptoms assessed.  Patient endorses she is breathing better and feels like there is "less junk in my chest today".  We discussed use of as needed medications to aggressively manage her symptoms.  Patient has no complaints at this time.  No need for medication administration or adjustments to medications today.  We again discussed goals of care.  Patient's goal remains to return home to live with her daughter.   Patient continues to endorse full code and full scope.  She also confirms she would never want to live long-term on artificial life support.  She would  never be accepting of trach/PEG but would want to be kept alive long enough to say goodbye to her family and be evaluated for organ donation.  Patient asked that her brother Christina Hester's name be removed from her demographics/contact list.  She endorses that she would like her medical information shared only with her children, Christina Hester and Christina Hester.  Demographics updated to reflect patient's wishes.  Patient requested that I speak with Christina Hester.  I spoke with Christina Hester over the phone.  Medical update given.  Extensive education provided on COPD as a progressive, irreversible disease, protein/nutritional intake, parainfluenza, functional status, and medications (scheduled and PRN).  Discussed that there is no cure but that slowing progression of COPD and extending time between exacerbations is the focus of medical treatment.  Questions and concerns were addressed.  Christina Hester was given PMT contact information and was encouraged to contact PMT with any future acute palliative needs.   PMT will monitor the patient peripherally and shadow her chart. Please re-engage with PMT if goals change, at patient/family's request, or if patient's health deteriorates during hospitalization.    Physical Exam Vitals reviewed.  Constitutional:      General: She is not in acute distress.    Appearance: She is not ill-appearing.  HENT:     Head: Normocephalic.  Cardiovascular:     Rate and Rhythm: Normal rate.  Musculoskeletal:        General: Normal range of motion.  Cervical back: Normal range of motion.  Skin:    General: Skin is warm and dry.  Neurological:     Mental Status: She is alert and oriented to person, place, and time.  Psychiatric:        Mood and Affect: Mood is not anxious.        Behavior: Behavior is not agitated.             Total Time 50 minutes   Tamalyn Wadsworth L. Manon Hilding, FNP-BC Palliative Medicine Team Team Phone # [phone]

## 2022-11-28 NOTE — Progress Notes (Signed)
[facility]  - [place] at [facility]   PATIENT NAME: Christina Hester    MR#:  [id number]  DATE OF BIRTH:  1940-12-06  SUBJECTIVE:  patient sitting up at bedside. Ambulating up to the bathroom per RN. Tells me see if she uses oxygen only at nighttime. On no family at bedside. Overall feels a bit better.   VITALS:  Blood pressure (!) 145/68, pulse 80, temperature 97.7 F (36.5 C), resp. rate 17, height 5' (1.524 m), weight 29.8 kg, SpO2 93 %.  PHYSICAL EXAMINATION:   GENERAL:  82 y.o.-year-old patient with no acute distress. Thin, frail LUNGS: distant breath sounds bilaterally, no wheezing no respiratory distress CARDIOVASCULAR: S1, S2 normal. No murmur   ABDOMEN: Soft, nontender, nondistended. Bowel sounds present.  EXTREMITIES: No  edema b/l.    NEUROLOGIC: nonfocal  patient is alert and awake SKIN: No obvious rash, lesion, or ulcer.   LABORATORY PANEL:  CBC Recent Labs  Lab 11/27/22 0401  WBC 5.2  HGB 12.3  HCT 35.6*  PLT 187    Chemistries  Recent Labs  Lab 11/26/22 0306 11/27/22 0401 11/28/22 0512  NA 127*   < > 129*  K 4.4   < > 3.5  CL 89*   < > 87*  CO2 29   < > 35*  GLUCOSE 135*   < > 90  BUN 21   < > 23  CREATININE 0.73   < > 0.76  CALCIUM 9.5   < > 8.9  MG  --    < > 2.0  AST 44*  --   --   ALT 29  --   --   ALKPHOS 74  --   --   BILITOT 0.7  --   --    < > = values in this interval not displayed.   Assessment and Plan  Christina Hester is a 82 y.o. female with medical history significant of chronic respiratory failure on 2 L, COPD on chronic prednisone, diastolic dysfunction, carotid artery stenosis, peripheral artery disease presenting with acute on chronic respiratory failure with hypoxia, COPD exacerbation.  Acute on chronic respiratory failure with hypoxia (HCC) parainfluenza viral infection --Decompensated respiratory failure in setting of baseline COPD and chronic home O2 use at 2 L Positive increased wheezing, sputum  production and cough times approximately 4 weeks with increased O2 use up to 4 L --On prednisone chronically as well as 3 times weekly azithromycin --Chest x-ray relatively stable --Will place patient on IV Solu-Medrol, DuoNebs--change to oral steroid -- empirically on azithromycin -- appreciate Dr. Karna Christmas for consultation-- okay to discharge home and follow-up outpatient   (HFpEF) heart failure with preserved ejection fraction (HCC) 02/2017 Echo: EF 55-60%, gr1 DD  Mildly dry  stable   Carotid stenosis --Followed by Dr. Wyn Quaker 08/2022 Duplex shows a patent right carotid endarterectomy and stable 40-59% left ICA stenosis  --Continue Plavix   Coronary artery disease --Cardiac catheterization 02/2017 with severe one-vessel CAD with chronically occluded RCA with well developed left-to-right collaterals  --No active chest pain --Troponin negative  --EKG stable --Noted ASA intolerance- on plavix    Hyponatremia --Sodium 128 on presentation Looks to be acute on chronic issue with sodium baseline around 133 Clinically dry -- sodium improved  PT to see patient. Overall appears nearing baseline. Discharge home tomorrow if stable. She appreciate palliative care input   Advance Care Planning:   Code Status: Full Code    Consults: Dr. Karna Christmas w/ pulmonology  Family Communication: discussed with  Thayer Ohm grandson on the phone  Level of care: Telemetry Medical Status is: Inpatient Remains inpatient appropriate because: copd flare    TOTAL TIME TAKING CARE OF THIS PATIENT: 35 minutes.  >50% time spent on counselling and coordination of care  Note: This dictation was prepared with Dragon dictation along with smaller phrase technology. Any transcriptional errors that result from this process are unintentional.  Enedina Finner M.D    [facility]   CC: Primary care physician; Sallyanne Kuster, NP

## 2022-11-28 NOTE — Progress Notes (Signed)
PULMONOLOGY         Date: 11/28/2022,   MRN# [id number] Christina Hester 1940-08-17     AdmissionWeight: 31.3 kg                 CurrentWeight: 29.8 kg  Referring provider: Dr Idelle Leech   CHIEF COMPLAINT:   Acute on chronic hypoxemic respiratory failure   HISTORY OF PRESENT ILLNESS   [name] with hx of advanced copd and chronic hypoxemia with 2L/min [place].  She shares over past week prior to admission she progressively deteriorated at home with chest discomfort, cough and worsening dyspnea.  She denies having febrile illness.  She does take prednisone chronically.  She was admitted with severe COPD exacerbation with findings of heavy inspissated phlegm on expectoration. She required 4L/min to reach 88% on spO2.  She is slightly improved now. She has not been able to eat well due to feeling uwell.  She did get up oob to chair today to go to bathroom and sit up by window to watch TV.  She is exhausted after few minutes of light activity.   11/27/22- patient complains of pain with coughing.  She actually appears more stable and improved overall this morning but does complain of tenderness at bottom of both lungs worse with forceful cough.  PARAINFLUENZA #3 is positive as etiology of viral disease causing Acute exacerbation of COPD.   11/28/22- patient is further improved.  Patient is weaned to 2L/min [place].  She is close to baseline and may be dcd home when cleared with PT/OT   PAST MEDICAL HISTORY   Past Medical History:  Diagnosis Date   AAA (abdominal aortic aneurysm) (HCC)    a. 05/2016 Abd U/S: 3.02 x 3.02 AAA.   Anxiety    CAD (coronary artery disease)    a. 02/2017 NSTEMI/Cath: LM nl, LAD mild dzs, D2 min irregs, LCX mild dzs, OM2 50ost, RCA 95p, 130m, L-->R collats, EF 55-65%.   COPD (chronic obstructive pulmonary disease) (HCC)    Diastolic dysfunction    a. 02/2017 Echo: EF 55-60%, gr1 DD, ? inf HK, mild MR.   Hx of basal cell carcinoma    back    Hx of squamous cell  carcinoma 12/02/2018   Right lateral breast    Hyperlipidemia    Hypertension    PAD (peripheral artery disease) (HCC)    a. 05/2016 ABI's: R 1.22, L 1.11.   Tobacco abuse      SURGICAL HISTORY   Past Surgical History:  Procedure Laterality Date   ABDOMINAL HYSTERECTOMY     APPENDECTOMY     BREAST EXCISIONAL BIOPSY Left 80's or 90's   NEG   CARDIAC CATHETERIZATION     COLONOSCOPY N/A 05/22/2018   Procedure: COLONOSCOPY;  Surgeon: Toney Reil, MD;  Location: [facility];  Service: Gastroenterology;  Laterality: N/A;   COLONOSCOPY WITH PROPOFOL N/A 07/19/2020   Procedure: COLONOSCOPY WITH PROPOFOL;  Surgeon: Toledo, Boykin Nearing, MD;  Location: [facility];  Service: Gastroenterology;  Laterality: N/A;   ENDARTERECTOMY Right 11/06/2019   Procedure: ENDARTERECTOMY CAROTID;  Surgeon: Annice Needy, MD;  Location: [facility];  Service: Vascular;  Laterality: Right;   LEFT HEART CATH Bilateral 03/05/2017   Procedure: Left Heart Cath with poss PCI;  Surgeon: Iran Ouch, MD;  Location: [facility] INVASIVE CV LAB;  Service: Cardiovascular;  Laterality: Bilateral;     FAMILY HISTORY   Family History  Problem Relation Age of Onset   Breast  cancer Mother    Dementia Mother    Hypertension Mother    Breast cancer Maternal Aunt    Breast cancer Maternal Aunt    Breast cancer Maternal Aunt    CVA Father      SOCIAL HISTORY   Social History   Tobacco Use   Smoking status: Former    Packs/day: .25    Types: Cigarettes    Quit date: 07/12/2017    Years since quitting: 5.3   Smokeless tobacco: Never   Tobacco comments:    currently 90 days without smoking  Vaping Use   Vaping Use: Never used  Substance Use Topics   Alcohol use: No   Drug use: No     MEDICATIONS    Home Medication:    Current Medication:  Current Facility-Administered Medications:    0.9 %  sodium chloride infusion, , Intravenous, Continuous, Floydene Flock, MD, Last Rate: 75 mL/hr at  11/25/22 1454, New Bag at 11/25/22 1454   acetaminophen (TYLENOL) tablet 650 mg, 650 mg, Oral, Q6H PRN, Willeen Niece, MD, 650 mg at 11/27/22 1641   amLODipine (NORVASC) tablet 5 mg, 5 mg, Oral, Daily, Floydene Flock, MD, 5 mg at 11/27/22 0853   azithromycin (ZITHROMAX) tablet 250 mg, 250 mg, Oral, Daily, Vida Rigger, MD, 250 mg at 11/27/22 0853   bisoprolol (ZEBETA) tablet 5 mg, 5 mg, Oral, Daily, Floydene Flock, MD, 5 mg at 11/27/22 0853   budesonide (PULMICORT) nebulizer solution 0.25 mg, 0.25 mg, Nebulization, BID, Manuela Schwartz, NP, 0.25 mg at 11/28/22 0720   clopidogrel (PLAVIX) tablet 75 mg, 75 mg, Oral, Daily, Floydene Flock, MD, 75 mg at 11/27/22 0853   enoxaparin (LOVENOX) injection 30 mg, 30 mg, Subcutaneous, Q24H, Hallaji, Sheema M, RPH   guaiFENesin-dextromethorphan (ROBITUSSIN DM) 100-10 MG/5ML syrup 5 mL, 5 mL, Oral, Q4H PRN, Idelle Leech, Pardeep, MD, 5 mL at 11/27/22 0402   ipratropium-albuterol (DUONEB) 0.5-2.5 (3) MG/3ML nebulizer solution 3 mL, 3 mL, Nebulization, Q4H PRN, Floydene Flock, MD, 3 mL at 11/27/22 0043   ipratropium-albuterol (DUONEB) 0.5-2.5 (3) MG/3ML nebulizer solution 3 mL, 3 mL, Nebulization, Q6H, Khatri, Pardeep, MD, 3 mL at 11/28/22 0720   LORazepam (ATIVAN) tablet 0.5 mg, 0.5 mg, Oral, Q6H PRN, Floydene Flock, MD, 0.5 mg at 11/28/22 0324   nitroGLYCERIN (NITROSTAT) SL tablet 0.4 mg, 0.4 mg, Sublingual, Q5 min PRN, Floydene Flock, MD   nystatin (MYCOSTATIN) 100000 UNIT/ML suspension 500,000 Units, 5 mL, Oral, PRN, Floydene Flock, MD   ondansetron (ZOFRAN) tablet 4 mg, 4 mg, Oral, Q6H PRN **OR** ondansetron (ZOFRAN) injection 4 mg, 4 mg, Intravenous, Q6H PRN, Floydene Flock, MD   pantoprazole (PROTONIX) EC tablet 40 mg, 40 mg, Oral, Daily, Floydene Flock, MD, 40 mg at 11/27/22 0853   [COMPLETED] methylPREDNISolone sodium succinate (SOLU-MEDROL) 40 mg/mL injection 40 mg, 40 mg, Intravenous, Q24H, 40 mg at 11/25/22 1504 **FOLLOWED BY**  predniSONE (DELTASONE) tablet 40 mg, 40 mg, Oral, Q breakfast, Floydene Flock, MD, 40 mg at 11/27/22 0853   sodium chloride (OCEAN) 0.65 % nasal spray 1 spray, 1 spray, Each Nare, PRN, Willeen Niece, MD, 1 spray at 11/27/22 0402    ALLERGIES   Hydrocodone-acetaminophen, Aspirin, Budesonide-formoterol fumarate, Prednisone, Sulfa antibiotics, and Tramadol     REVIEW OF SYSTEMS    Review of Systems:  Gen:  Denies  fever, sweats, chills weigh loss  HEENT: Denies blurred vision, double vision, ear pain, eye pain, hearing loss, nose bleeds, sore throat  Cardiac:  No dizziness, chest pain or heaviness, chest tightness,edema Resp:   reports dyspnea chronically  Gi: Denies swallowing difficulty, stomach pain, nausea or vomiting, diarrhea, constipation, bowel incontinence Gu:  Denies bladder incontinence, burning urine Ext:   Denies Joint pain, stiffness or swelling Skin: Denies  skin rash, easy bruising or bleeding or hives Endoc:  Denies polyuria, polydipsia , polyphagia or weight change Psych:   Denies depression, insomnia or hallucinations   Other:  All other systems negative   VS: BP (!) 145/68 (BP Location: Left Arm)   Pulse 80   Temp 97.7 F (36.5 C)   Resp 17   Ht 5' (1.524 m)   Wt 29.8 kg   SpO2 93%   BMI 12.83 kg/m      PHYSICAL EXAM    GENERAL:NAD, no fevers, chills, no weakness no fatigue HEAD: Normocephalic, atraumatic.  EYES: Pupils equal, round, reactive to light. Extraocular muscles intact. No scleral icterus.  MOUTH: Moist mucosal membrane. Dentition intact. No abscess noted.  EAR, NOSE, THROAT: Clear without exudates. No external lesions.  NECK: Supple. No thyromegaly. No nodules. No JVD.  PULMONARY: decreased breath sounds with mild rhonchi worse at bases bilaterally.  CARDIOVASCULAR: S1 and S2. Regular rate and rhythm. No murmurs, rubs, or gallops. No edema. Pedal pulses 2+ bilaterally.  GASTROINTESTINAL: Soft, nontender, nondistended. No masses.  Positive bowel sounds. No hepatosplenomegaly.  MUSCULOSKELETAL: No swelling, clubbing, or edema. Range of motion full in all extremities.  NEUROLOGIC: Cranial nerves II through XII are intact. No gross focal neurological deficits. Sensation intact. Reflexes intact.  SKIN: No ulceration, lesions, rashes, or cyanosis. Skin warm and dry. Turgor intact.  PSYCHIATRIC: Mood, affect within normal limits. The patient is awake, alert and oriented x 3. Insight, judgment intact.       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  - Patient is on cefepime and solumedrol, will reduce to zithromax po and prednisone with tapering -parainfluenza 3 etiology of current exacerbation  -DC pulmicort  -continue duoneb -PT/OT and IS -reviewed plan with RN and patient        Thank you for allowing me to participate in the care of this patient.   Patient/Family are satisfied with care plan and all questions have been answered.    Provider disclosure: Patient with at least one acute or chronic illness or injury that poses a threat to life or bodily function and is being managed actively during this encounter.  All of the below services have been performed independently by signing provider:  review of prior documentation from internal and or external health records.  Review of previous and current lab results.  Interview and comprehensive assessment during patient visit today. Review of current and previous chest radiographs/CT scans. Discussion of management and test interpretation with health care team and patient/family.   This document was prepared using Dragon voice recognition software and may include  unintentional dictation errors.     Vida Rigger, M.D.  Division of Pulmonary & Critical Care Medicine

## 2022-11-29 DIAGNOSIS — J9621 Acute and chronic respiratory failure with hypoxia: Secondary | ICD-10-CM | POA: Diagnosis not present

## 2022-11-29 NOTE — Discharge Instructions (Signed)
Use your oxygen, nebulizer, inhalers as before 

## 2022-11-29 NOTE — Plan of Care (Signed)
Patient will remain comfortable and not in respiratory distress. Problem: Education: Goal: Knowledge of disease or condition will improve Outcome: Progressing Goal: Knowledge of the prescribed therapeutic regimen will improve Outcome: Progressing Goal: Individualized Educational Video(s) Outcome: Progressing   Problem: Activity: Goal: Ability to tolerate increased activity will improve Outcome: Progressing Goal: Will verbalize the importance of balancing activity with adequate rest periods Outcome: Progressing   Problem: Respiratory: Goal: Ability to maintain a clear airway will improve Outcome: Progressing Goal: Levels of oxygenation will improve Outcome: Progressing Goal: Ability to maintain adequate ventilation will improve Outcome: Progressing   Problem: Education: Goal: Knowledge of General Education information will improve Description: Including pain rating scale, medication(s)/side effects and non-pharmacologic comfort measures Outcome: Progressing   Problem: Health Behavior/Discharge Planning: Goal: Ability to manage health-related needs will improve Outcome: Progressing   Problem: Clinical Measurements: Goal: Ability to maintain clinical measurements within normal limits will improve Outcome: Progressing Goal: Will remain free from infection Outcome: Progressing Goal: Diagnostic test results will improve Outcome: Progressing Goal: Respiratory complications will improve Outcome: Progressing Goal: Cardiovascular complication will be avoided Outcome: Progressing   Problem: Activity: Goal: Risk for activity intolerance will decrease Outcome: Progressing   Problem: Nutrition: Goal: Adequate nutrition will be maintained Outcome: Progressing   Problem: Coping: Goal: Level of anxiety will decrease Outcome: Progressing   Problem: Elimination: Goal: Will not experience complications related to bowel motility Outcome: Progressing Goal: Will not experience  complications related to urinary retention Outcome: Progressing   Problem: Pain Managment: Goal: General experience of comfort will improve Outcome: Progressing   Problem: Safety: Goal: Ability to remain free from injury will improve Outcome: Progressing   Problem: Skin Integrity: Goal: Risk for impaired skin integrity will decrease Outcome: Progressing

## 2022-11-29 NOTE — Progress Notes (Signed)
PULMONOLOGY         Date: 11/29/2022,   MRN# [id number] Christina Hester May 27, 1941     AdmissionWeight: 31.3 kg                 CurrentWeight: 29.8 kg  Referring provider: Dr Idelle Leech   CHIEF COMPLAINT:   Acute on chronic hypoxemic respiratory failure   HISTORY OF PRESENT ILLNESS   [name] with hx of advanced copd and chronic hypoxemia with 2L/min [place].  She shares over past week prior to admission she progressively deteriorated at home with chest discomfort, cough and worsening dyspnea.  She denies having febrile illness.  She does take prednisone chronically.  She was admitted with severe COPD exacerbation with findings of heavy inspissated phlegm on expectoration. She required 4L/min to reach 88% on spO2.  She is slightly improved now. She has not been able to eat well due to feeling uwell.  She did get up oob to chair today to go to bathroom and sit up by window to watch TV.  She is exhausted after few minutes of light activity.   11/27/22- patient complains of pain with coughing.  She actually appears more stable and improved overall this morning but does complain of tenderness at bottom of both lungs worse with forceful cough.  PARAINFLUENZA #3 is positive as etiology of viral disease causing Acute exacerbation of COPD.   11/28/22- patient is further improved.  Patient is weaned to 2L/min [place].  She is close to baseline and may be dcd home when cleared with PT/OT  11/29/22- patient is safe for dc home.  She is close to home setting and has O2 at home which she uses nocturnally.     PAST MEDICAL HISTORY   Past Medical History:  Diagnosis Date   AAA (abdominal aortic aneurysm) (HCC)    a. 05/2016 Abd U/S: 3.02 x 3.02 AAA.   Anxiety    CAD (coronary artery disease)    a. 02/2017 NSTEMI/Cath: LM nl, LAD mild dzs, D2 min irregs, LCX mild dzs, OM2 50ost, RCA 95p, 143m, L-->R collats, EF 55-65%.   COPD (chronic obstructive pulmonary disease) (HCC)    Diastolic dysfunction     a. 02/2017 Echo: EF 55-60%, gr1 DD, ? inf HK, mild MR.   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 05/2016 ABI's: R 1.22, L 1.11.   Tobacco abuse      SURGICAL HISTORY   Past Surgical History:  Procedure Laterality Date   ABDOMINAL HYSTERECTOMY     APPENDECTOMY     BREAST EXCISIONAL BIOPSY Left 80's or 90's   NEG   CARDIAC CATHETERIZATION     COLONOSCOPY N/A 05/22/2018   Procedure: COLONOSCOPY;  Surgeon: Toney Reil, MD;  Location: [facility];  Service: Gastroenterology;  Laterality: N/A;   COLONOSCOPY WITH PROPOFOL N/A 07/19/2020   Procedure: COLONOSCOPY WITH PROPOFOL;  Surgeon: Toledo, Boykin Nearing, MD;  Location: [facility];  Service: Gastroenterology;  Laterality: N/A;   ENDARTERECTOMY Right 11/06/2019   Procedure: ENDARTERECTOMY CAROTID;  Surgeon: Annice Needy, MD;  Location: [facility] OR[facility];  Service: Vascular;  Laterality: Right;   LEFT HEART CATH Bilateral 03/05/2017   Procedure: Left Heart Cath with poss PCI;  Surgeon: Iran Ouch, MD;  Location: [facility] INV[facility]IVE CV  LAB;  Service: Cardiovascular;  Laterality: Bilateral;     FAMILY HISTORY   Family History  Problem Relation Age of Onset   Breast cancer Mother    Dementia Mother    Hypertension Mother    Breast cancer Maternal Aunt    Breast cancer Maternal Aunt    Breast cancer Maternal Aunt    CVA Father      SOCIAL HISTORY   Social History   Tobacco Use   Smoking status: Former    Packs/day: .25    Types: Cigarettes    Quit date: 07/12/2017    Years since quitting: 5.3   Smokeless tobacco: Never   Tobacco comments:    currently 90 days without smoking  Vaping Use   Vaping Use: Never used  Substance Use Topics   Alcohol use: No   Drug use: No     MEDICATIONS    Home Medication:    Current Medication:  Current Facility-Administered  Medications:    acetaminophen (TYLENOL) tablet 650 mg, 650 mg, Oral, Q6H PRN, Willeen Niece, MD, 650 mg at 11/27/22 1641   amLODipine (NORVASC) tablet 5 mg, 5 mg, Oral, Daily, Floydene Flock, MD, 5 mg at 11/28/22 0830   azithromycin (ZITHROMAX) tablet 250 mg, 250 mg, Oral, Daily, Karna Christmas, Bowden Boody, MD, 250 mg at 11/28/22 0830   bisoprolol (ZEBETA) tablet 5 mg, 5 mg, Oral, Daily, Floydene Flock, MD, 5 mg at 11/28/22 0830   budesonide (PULMICORT) nebulizer solution 0.25 mg, 0.25 mg, Nebulization, BID, Manuela Schwartz, NP, 0.25 mg at 11/29/22 0750   clopidogrel (PLAVIX) tablet 75 mg, 75 mg, Oral, Daily, Floydene Flock, MD, 75 mg at 11/28/22 0830   enoxaparin (LOVENOX) injection 30 mg, 30 mg, Subcutaneous, Q24H, Hallaji, Sheema M, RPH   guaiFENesin-dextromethorphan (ROBITUSSIN DM) 100-10 MG/5ML syrup 5 mL, 5 mL, Oral, Q4H PRN, Idelle Leech, Pardeep, MD, 5 mL at 11/27/22 0402   ipratropium-albuterol (DUONEB) 0.5-2.5 (3) MG/3ML nebulizer solution 3 mL, 3 mL, Nebulization, Q4H PRN, Floydene Flock, MD, 3 mL at 11/27/22 0043   ipratropium-albuterol (DUONEB) 0.5-2.5 (3) MG/3ML nebulizer solution 3 mL, 3 mL, Nebulization, Q6H, Khatri, Pardeep, MD, 3 mL at 11/29/22 0750   LORazepam (ATIVAN) tablet 0.5 mg, 0.5 mg, Oral, Q6H PRN, Floydene Flock, MD, 0.5 mg at 11/28/22 1917   nitroGLYCERIN (NITROSTAT) SL tablet 0.4 mg, 0.4 mg, Sublingual, Q5 min PRN, Floydene Flock, MD   nystatin (MYCOSTATIN) 100000 UNIT/ML suspension 500,000 Units, 5 mL, Oral, PRN, Floydene Flock, MD   ondansetron (ZOFRAN) tablet 4 mg, 4 mg, Oral, Q6H PRN **OR** ondansetron (ZOFRAN) injection 4 mg, 4 mg, Intravenous, Q6H PRN, Floydene Flock, MD   pantoprazole (PROTONIX) EC tablet 40 mg, 40 mg, Oral, Daily, Floydene Flock, MD, 40 mg at 11/28/22 0830   [COMPLETED] methylPREDNISolone sodium succinate (SOLU-MEDROL) 40 mg/mL injection 40 mg, 40 mg, Intravenous, Q24H, 40 mg at 11/25/22 1504 **FOLLOWED BY** predniSONE (DELTASONE) tablet 40  mg, 40 mg, Oral, Q breakfast, Floydene Flock, MD, 40 mg at 11/28/22 0830   sodium chloride (OCEAN) 0.65 % nasal spray 1 spray, 1 spray, Each Nare, PRN, Willeen Niece, MD, 1 spray at 11/27/22 0402    ALLERGIES   Hydrocodone-acetaminophen, Aspirin, Budesonide-formoterol fumarate, Prednisone, Sulfa antibiotics, and Tramadol     REVIEW OF SYSTEMS    Review of Systems:  Gen:  Denies  fever, sweats, chills weigh loss  HEENT: Denies blurred vision, double vision, ear pain, eye pain, hearing loss, nose bleeds, sore throat Cardiac:  No dizziness, chest pain or heaviness, chest tightness,edema Resp:   reports dyspnea chronically  Gi: Denies swallowing difficulty, stomach pain, nausea or vomiting, diarrhea, constipation, bowel incontinence Gu:  Denies bladder incontinence, burning urine Ext:   Denies Joint pain, stiffness or swelling Skin: Denies  skin rash, easy bruising or bleeding or hives Endoc:  Denies polyuria, polydipsia , polyphagia or weight change Psych:   Denies depression, insomnia or hallucinations   Other:  All other systems negative   VS: BP (!) 135/55 (BP Location: Right Arm)   Pulse 64   Temp 98.3 F (36.8 C)   Resp 20   Ht 5' (1.524 m)   Wt 29.8 kg   SpO2 100%   BMI 12.83 kg/m      PHYSICAL EXAM    GENERAL:NAD, no fevers, chills, no weakness no fatigue HEAD: Normocephalic, atraumatic.  EYES: Pupils equal, round, reactive to light. Extraocular muscles intact. No scleral icterus.  MOUTH: Moist mucosal membrane. Dentition intact. No abscess noted.  EAR, NOSE, THROAT: Clear without exudates. No external lesions.  NECK: Supple. No thyromegaly. No nodules. No JVD.  PULMONARY: decreased breath sounds with mild rhonchi worse at bases bilaterally.  CARDIOVASCULAR: S1 and S2. Regular rate and rhythm. No murmurs, rubs, or gallops. No edema. Pedal pulses 2+ bilaterally.  GASTROINTESTINAL: Soft, nontender, nondistended. No masses. Positive bowel sounds. No  hepatosplenomegaly.  MUSCULOSKELETAL: No swelling, clubbing, or edema. Range of motion full in all extremities.  NEUROLOGIC: Cranial nerves II through XII are intact. No gross focal neurological deficits. Sensation intact. Reflexes intact.  SKIN: No ulceration, lesions, rashes, or cyanosis. Skin warm and dry. Turgor intact.  PSYCHIATRIC: Mood, affect within normal limits. The patient is awake, alert and oriented x 3. Insight, judgment intact.       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  - Patient is on cefepime and solumedrol, will reduce to zithromax po and prednisone with tapering -parainfluenza 3 etiology of current exacerbation  -DC pulmicort  -continue duoneb -PT/OT and IS -reviewed plan with RN and patient        Thank you for allowing me to participate in the care of this patient.   Patient/Family are satisfied with care plan and all questions have been answered.    Provider disclosure: Patient with at least one acute or chronic illness or injury that poses a threat to life or bodily function and is being managed actively during this encounter.  All of the below services have been performed independently by signing provider:  review of prior documentation from internal and or external health records.  Review of previous and current lab results.  Interview and comprehensive assessment during patient visit today. Review of current and previous chest radiographs/CT scans. Discussion of management and test interpretation with health care team and patient/family.   This document was prepared using Dragon voice recognition software and may include unintentional dictation  errors.     Vida Rigger, M.D.  Division of Pulmonary & Critical Care Medicine

## 2022-11-29 NOTE — Discharge Summary (Signed)
Physician Discharge Summary   Patient: Christina Hester MRN: [id number] DOB: [birth year]  Admit date:     11/25/2022  Discharge date: 11/29/22  Discharge Physician: Enedina Finner   PCP: Sallyanne Kuster, NP   Recommendations at discharge:   Use your oxygen, nebulizer, inhalers as before follow-up with pulmonary Dr Karna Christmas as out in 1-2 weeks follow-up PCP in 1 to 2 weeks  Discharge Diagnoses: Principal Problem:   Acute on chronic respiratory failure with hypoxia (HCC) Active Problems:   Hyponatremia   Coronary artery disease   Carotid stenosis   (HFpEF) heart failure with preserved ejection fraction (HCC)  Christina Hester is a 82 y.o. female with medical history significant of chronic respiratory failure on 2 L, COPD on chronic prednisone, diastolic dysfunction, carotid artery stenosis, peripheral artery disease presenting with acute on chronic respiratory failure with hypoxia, COPD exacerbation.   Acute on chronic respiratory failure with hypoxia (HCC) parainfluenza viral infection --Decompensated respiratory failure in setting of baseline COPD and chronic home O2 use at 2 L Positive increased wheezing, sputum production and cough times approximately 4 weeks with increased O2 use up to 4 L --On prednisone chronically as well as 3 times weekly azithromycin --Chest x-ray relatively stable --pt was on IV Solu-Medrol, DuoNebs--change to oral steroid--pt is chronic steroid dependent -- empirically on azithromycin --appreciate Dr. Karna Christmas for consultation-- okay to discharge home and follow-up outpatient --pt on oxygen qhs and prn daytime   (HFpEF) heart failure with preserved ejection fraction (HCC) 02/2017 Echo: EF 55-60%, gr1 DD  Mildly dry   Carotid stenosis --Followed by Dr. Wyn Quaker 08/2022 Duplex shows a patent right carotid endarterectomy and stable 40-59% left ICA stenosis  --Continue Plavix   Coronary artery disease --Cardiac catheterization 02/2017 with severe one-vessel  CAD with chronically occluded RCA with well developed left-to-right collaterals  --No active chest pain --Troponin negative  --EKG stable --Noted ASA intolerance- on plavix    Hyponatremia --Sodium 128 on presentation Looks to be acute on chronic issue with sodium baseline around 133 Clinically dry -- sodium improved   Overall appears near baseline. D/c home today. Pt agreeable     Advance Care Planning:   Code Status: Full Code    Consults: Dr. Karna Christmas w/ pulmonology    Family Communication: none today  Level of care: Telemetry Medical      Diet recommendation:  Discharge Diet Orders (From admission, onward)     Start     Ordered   11/29/22 0000  Diet - low sodium heart healthy        11/29/22 0956            DISCHARGE MEDICATION: Allergies as of 11/29/2022       Reactions   Hydrocodone-acetaminophen Other (See Comments)   Facial numbness   Aspirin Tinitus   Budesonide-formoterol Fumarate Other (See Comments)   Patient felt "out of her mind" and angry. Patient felt "out of her mind" and angry.   Prednisone Other (See Comments)   Makes violent. Patient tolerates low doses   Sulfa Antibiotics Nausea And Vomiting   Tramadol Other (See Comments)        Medication List     STOP taking these medications    benzonatate 200 MG capsule Commonly known as: TESSALON   hydrOXYzine 10 MG tablet Commonly known as: ATARAX       TAKE these medications    amLODipine 5 MG tablet Commonly known as: NORVASC Take 5 mg by mouth daily.   azithromycin 250  MG tablet Commonly known as: ZITHROMAX Take 1 tablet by mouth every Monday, Wednesday, and Friday.   baclofen 10 MG tablet Commonly known as: LIORESAL Take 0.5 tablets (5 mg total) by mouth 3 (three) times daily as needed for muscle spasms.   bisoprolol 5 MG tablet Commonly known as: ZEBETA Take 1 tablet (5 mg total) by mouth daily.   buPROPion 100 MG tablet Commonly known as: WELLBUTRIN Take 1  tablet (100 mg total) by mouth daily.   clopidogrel 75 MG tablet Commonly known as: PLAVIX Take 1 tablet (75 mg total) by mouth daily.   clotrimazole 10 MG troche Commonly known as: MYCELEX Take 10 mg by mouth as needed (thrush). What changed: Another medication with the same name was removed. Continue taking this medication, and follow the directions you see here.   ipratropium-albuterol 0.5-2.5 (3) MG/3ML Soln Commonly known as: DUONEB USE 1 VIAL IN NEBULIZER EVERY 4 HOURS   LORazepam 0.5 MG tablet Commonly known as: ATIVAN TAKE 1/2 TO 1 TABLET BY MOUTH EVERY 8 HOURS AS NEEDED FOR ANXIETY OR SLEEP   meloxicam 15 MG tablet Commonly known as: MOBIC Take 1 tablet (15 mg total) by mouth daily.   nitroGLYCERIN 0.4 MG SL tablet Commonly known as: NITROSTAT Place 1 tablet (0.4 mg total) under the tongue every 5 (five) minutes as needed for chest pain.   nystatin 100000 UNIT/ML suspension Commonly known as: MYCOSTATIN Take 5 mLs (500,000 Units total) by mouth 4 (four) times daily. What changed:  when to take this reasons to take this   ondansetron 4 MG tablet Commonly known as: ZOFRAN Take 1 tablet (4 mg total) by mouth every 8 (eight) hours as needed for nausea or vomiting.   pantoprazole 40 MG tablet Commonly known as: PROTONIX Take 1 tablet (40 mg total) by mouth daily.   predniSONE 1 MG tablet Commonly known as: DELTASONE Take 2 mg by mouth daily with breakfast.   Ventolin HFA 108 (90 Base) MCG/ACT inhaler Generic drug: albuterol INHALE 2 PUFFS INTO THE LUNGS EVERY 6 HOURS AS NEEDED FOR WHEEZING OR SHORTNESS OF BREATH.        Follow-up Information     Sallyanne Kuster, NP. Schedule an appointment as soon as possible for a visit in 1 week(s).   Specialty: Nurse Practitioner Why: hospital f/u Contact information: [address] [place] [address] [phone]         Vida Rigger, MD. Schedule an appointment as soon as possible for a visit.    Specialty: Pulmonary Disease Why: f/u COPD Contact information: [address] [place] [address] [phone]                Discharge Exam: Ceasar Mons Weights   11/25/22 0934 11/25/22 1446  Weight: 31.3 kg 29.8 kg   GENERAL:  82 y.o.-year-old patient with no acute distress. Thin, fraile LUNGS: distant breath sounds bilaterally, no wheezing no respiratory distress CARDIOVASCULAR: S1, S2 normal. No murmur   ABDOMEN: Soft, nontender, nondistended. Bowel sounds present.  EXTREMITIES: No  edema b/l.    NEUROLOGIC: nonfocal  patient is alert and awake  Condition at discharge: fair  The results of significant diagnostics from this hospitalization (including imaging, microbiology, ancillary and laboratory) are listed below for reference.   Imaging Studies: DG Chest 2 View  Result Date: 11/25/2022 CLINICAL DATA:  Shortness of breath EXAM: CHEST - 2 VIEW COMPARISON:  09/04/2022 FINDINGS: Normal heart size and mediastinal contours. Large lung volumes. No acute infiltrate or edema. No effusion  or pneumothorax. No acute osseous findings. IMPRESSION: No active cardiopulmonary disease. Electronically Signed   By: Tiburcio Pea M.D.   On: 11/25/2022 10:28    Microbiology: Results for orders placed or performed during the hospital encounter of 11/25/22  Resp panel by RT-PCR (RSV, Flu A&B, Covid) Anterior Nasal Swab     Status: None   Collection Time: 11/25/22 11:00 AM   Specimen: Anterior Nasal Swab  Result Value Ref Range Status   SARS Coronavirus 2 by RT PCR NEGATIVE NEGATIVE Final    Comment: (NOTE) SARS-CoV-2 target nucleic acids are NOT DETECTED.  The SARS-CoV-2 RNA is generally detectable in upper respiratory specimens during the acute phase of infection. The lowest concentration of SARS-CoV-2 viral copies this assay can detect is 138 copies/mL. A negative result does not preclude SARS-Cov-2 infection and should not be used as the sole basis for treatment or other  patient management decisions. A negative result may occur with  improper specimen collection/handling, submission of specimen other than nasopharyngeal swab, presence of viral mutation(s) within the areas targeted by this assay, and inadequate number of viral copies(<138 copies/mL). A negative result must be combined with clinical observations, patient history, and epidemiological information. The expected result is Negative.  Fact Sheet for Patients:  BloggerCourse.com  Fact Sheet for Healthcare Providers:  SeriousBroker.it  This test is no t yet approved or cleared by the [place] FDA and  has been authorized for detection and/or diagnosis of SARS-CoV-2 by FDA under an Emergency Use Authorization (EUA). This EUA will remain  in effect (meaning this test can be used) for the duration of the COVID-19 declaration under Section 564(b)(1) of the Act, 21 U.S.C.section 360bbb-3(b)(1), unless the authorization is terminated  or revoked sooner.       Influenza A by PCR NEGATIVE NEGATIVE Final   Influenza B by PCR NEGATIVE NEGATIVE Final    Comment: (NOTE) The Xpert Xpress SARS-CoV-2/FLU/RSV plus assay is intended as an aid in the diagnosis of influenza from Nasopharyngeal swab specimens and should not be used as a sole basis for treatment. Nasal washings and aspirates are unacceptable for Xpert Xpress SARS-CoV-2/FLU/RSV testing.  Fact Sheet for Patients: BloggerCourse.com  Fact Sheet for Healthcare Providers: SeriousBroker.it  This test is not yet approved or cleared by the [place] FDA and has been authorized for detection and/or diagnosis of SARS-CoV-2 by FDA under an Emergency Use Authorization (EUA). This EUA will remain in effect (meaning this test can be used) for the duration of the COVID-19 declaration under Section 564(b)(1) of the Act, 21 U.S.C. section  360bbb-3(b)(1), unless the authorization is terminated or revoked.     Resp Syncytial Virus by PCR NEGATIVE NEGATIVE Final    Comment: (NOTE) Fact Sheet for Patients: BloggerCourse.com  Fact Sheet for Healthcare Providers: SeriousBroker.it  This test is not yet approved or cleared by the [place] FDA and has been authorized for detection and/or diagnosis of SARS-CoV-2 by FDA under an Emergency Use Authorization (EUA). This EUA will remain in effect (meaning this test can be used) for the duration of the COVID-19 declaration under Section 564(b)(1) of the Act, 21 U.S.C. section 360bbb-3(b)(1), unless the authorization is terminated or revoked.  Performed at [facility], [address] [place], [place] [address]   Respiratory (~20 pathogens) panel by PCR     Status: Abnormal   Collection Time: 11/26/22  2:33 PM   Specimen: Nasopharyngeal Swab; Respiratory  Result Value Ref Range Status   Adenovirus NOT DETECTED NOT DETECTED  Final   Coronavirus 229E NOT DETECTED NOT DETECTED Final    Comment: (NOTE) The Coronavirus on the Respiratory Panel, DOES NOT test for the novel  Coronavirus (2019 nCoV)    Coronavirus HKU1 NOT DETECTED NOT DETECTED Final   Coronavirus NL63 NOT DETECTED NOT DETECTED Final   Coronavirus OC43 NOT DETECTED NOT DETECTED Final   Metapneumovirus NOT DETECTED NOT DETECTED Final   Rhinovirus / Enterovirus NOT DETECTED NOT DETECTED Final   Influenza A NOT DETECTED NOT DETECTED Final   Influenza B NOT DETECTED NOT DETECTED Final   Parainfluenza Virus 1 NOT DETECTED NOT DETECTED Final   Parainfluenza Virus 2 NOT DETECTED NOT DETECTED Final   Parainfluenza Virus 3 DETECTED (A) NOT DETECTED Final   Parainfluenza Virus 4 NOT DETECTED NOT DETECTED Final   Respiratory Syncytial Virus NOT DETECTED NOT DETECTED Final   Bordetella pertussis NOT DETECTED NOT DETECTED Final   Bordetella Parapertussis NOT  DETECTED NOT DETECTED Final   Chlamydophila pneumoniae NOT DETECTED NOT DETECTED Final   Mycoplasma pneumoniae NOT DETECTED NOT DETECTED Final    Comment: Performed at [facility], [address], [place], [place] [address]    Labs: CBC: Recent Labs  Lab 11/25/22 [phone] 11/26/22 0306 11/27/22 0401  WBC 6.4 3.8* 5.2  NEUTROABS 4.5  --   --   HGB 12.8 12.4 12.3  HCT 37.4 36.1 35.6*  MCV 90.3 89.6 89.4  PLT 199 201 187   Basic Metabolic Panel: Recent Labs  Lab 11/25/22 0953 11/25/22 1437 11/25/22 1853 11/26/22 0306 11/27/22 0401 11/28/22 0512  NA 128*  --  125* 127* 128* 129*  K 3.2*  --   --  4.4 4.1 3.5  CL 87*  --   --  89* 86* 87*  CO2 31  --   --  29 33* 35*  GLUCOSE 100*  --   --  135* 96 90  BUN 12  --   --  21 16 23   CREATININE 0.65  --   --  0.73 0.69 0.76  CALCIUM 9.1  --   --  9.5 9.1 8.9  MG  --  1.7  --   --  1.9 2.0  PHOS  --   --   --   --  2.9 2.8   Liver Function Tests: Recent Labs  Lab 11/26/22 0306  AST 44*  ALT 29  ALKPHOS 74  BILITOT 0.7  PROT 7.4  ALBUMIN 4.3    Discharge time spent: greater than 30 minutes.  Signed: Enedina Finner, MD [facility] 11/29/2022

## 2022-11-29 NOTE — Progress Notes (Signed)
Discharge instructions reviewed with patient including followup visits and medication changes.  Understanding was verbalized and all questions were answered.  IV removed without complication; patient tolerated well.  Patient awaiting ride.

## 2022-11-29 NOTE — Care Management Important Message (Signed)
Important Message  Patient Details  Name: Christina Hester MRN: [id number] Date of Birth: 10-Mar-1941   Medicare Important Message Given:  Yes  Late entry - forgot to document earlier.   Olegario Messier A Azaiah Mello 11/29/2022, 1:51 PM

## 2022-12-01 ENCOUNTER — Other Ambulatory Visit: Payer: Self-pay | Admitting: Nurse Practitioner

## 2022-12-01 DIAGNOSIS — F411 Generalized anxiety disorder: Secondary | ICD-10-CM

## 2022-12-01 NOTE — Telephone Encounter (Signed)
Next appt 12/06/22

## 2022-12-06 ENCOUNTER — Ambulatory Visit (INDEPENDENT_AMBULATORY_CARE_PROVIDER_SITE_OTHER): Payer: Medicare Other | Admitting: Nurse Practitioner

## 2022-12-06 ENCOUNTER — Encounter: Payer: Self-pay | Admitting: Nurse Practitioner

## 2022-12-06 VITALS — BP 134/80 | HR 60 | Temp 98.2°F | Resp 16 | Ht 59.0 in | Wt <= 1120 oz

## 2022-12-06 DIAGNOSIS — J449 Chronic obstructive pulmonary disease, unspecified: Secondary | ICD-10-CM | POA: Diagnosis not present

## 2022-12-06 DIAGNOSIS — R0602 Shortness of breath: Secondary | ICD-10-CM | POA: Diagnosis not present

## 2022-12-06 DIAGNOSIS — F411 Generalized anxiety disorder: Secondary | ICD-10-CM | POA: Diagnosis not present

## 2022-12-06 DIAGNOSIS — Z09 Encounter for follow-up examination after completed treatment for conditions other than malignant neoplasm: Secondary | ICD-10-CM

## 2022-12-06 DIAGNOSIS — J9621 Acute and chronic respiratory failure with hypoxia: Secondary | ICD-10-CM | POA: Diagnosis not present

## 2022-12-06 NOTE — Progress Notes (Signed)
[facility] [address] [place] [address] [phone]                                   Transitional Care Clinic   [facility] Discharge Acute Issues Care Follow Up                                                                        Patient Demographics  Christina Hester, is a 82 y.o. female  DOB 11-Nov-1940  MRN [id number].  Primary MD  Sallyanne Kuster, NP  Admit date:     11/25/2022  Discharge date: 11/29/22    Reason for TCC follow Up - acute on chronic respiratory failure; COPD exacerbation   Past Medical History:  Diagnosis Date   AAA (abdominal aortic aneurysm) (HCC)    a. 05/2016 Abd U/S: 3.02 x 3.02 AAA.   Anxiety    CAD (coronary artery disease)    a. 02/2017 NSTEMI/Cath: LM nl, LAD mild dzs, D2 min irregs, LCX mild dzs, OM2 50ost, RCA 95p, 116m, L-->R collats, EF 55-65%.   COPD (chronic obstructive pulmonary disease) (HCC)    Diastolic dysfunction    a. 02/2017 Echo: EF 55-60%, gr1 DD, ? inf HK, mild MR.   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 05/2016 ABI's: R 1.22, L 1.11.   Tobacco abuse     Past Surgical History:  Procedure Laterality Date   ABDOMINAL HYSTERECTOMY     APPENDECTOMY     BREAST EXCISIONAL BIOPSY Left 80's or 90's   NEG   CARDIAC CATHETERIZATION     COLONOSCOPY N/A 05/22/2018   Procedure: COLONOSCOPY;  Surgeon: Toney Reil, MD;  Location: [facility] ENDOSCOPY;  Service: Gastroenterology;  Laterality: N/A;   COLONOSCOPY WITH PROPOFOL N/A 07/19/2020   Procedure: COLONOSCOPY WITH PROPOFOL;  Surgeon: Toledo, Boykin Nearing, MD;  Location: [facility] ENDOSCOPY;  Service: Gastroenterology;  Laterality: N/A;   ENDARTERECTOMY Right 11/06/2019   Procedure: ENDARTERECTOMY CAROTID;  Surgeon: Annice Needy, MD;  Location: [facility] ORS;  Service: Vascular;  Laterality: Right;    LEFT HEART CATH Bilateral 03/05/2017   Procedure: Left Heart Cath with poss PCI;  Surgeon: Iran Ouch, MD;  Location: [facility] INVASIVE CV LAB;  Service: Cardiovascular;  Laterality: Bilateral;       Recent HPI and Hospital Course  Christina Hester is a 82 y.o. female with medical history significant of chronic respiratory failure on 2 L, COPD on chronic prednisone, diastolic dysfunction, carotid artery stenosis, peripheral artery disease presenting with acute on chronic respiratory failure with hypoxia, COPD exacerbation.   Acute on chronic respiratory failure with hypoxia (HCC) parainfluenza viral infection --Decompensated respiratory failure in setting of baseline COPD and chronic home O2 use at 2 L Positive increased wheezing, sputum production and cough times approximately 4 weeks with increased O2 use up to 4 L --On prednisone chronically as well as 3 times weekly azithromycin --Chest x-ray relatively stable --pt  was on IV Solu-Medrol, DuoNebs--change to oral steroid--pt is chronic steroid dependent -- empirically on azithromycin --appreciate Dr. Karna Christmas for consultation-- okay to discharge home and follow-up outpatient --pt on oxygen qhs and prn daytime   (HFpEF) heart failure with preserved ejection fraction (HCC) 02/2017 Echo: EF 55-60%, gr1 DD  Mildly dry   Carotid stenosis --Followed by Dr. Wyn Quaker 08/2022 Duplex shows a patent right carotid endarterectomy and stable 40-59% left ICA stenosis  --Continue Plavix   Coronary artery disease --Cardiac catheterization 02/2017 with severe one-vessel CAD with chronically occluded RCA with well developed left-to-right collaterals  --No active chest pain --Troponin negative  --EKG stable --Noted ASA intolerance- on plavix    Hyponatremia --Sodium 128 on presentation Looks to be acute on chronic issue with sodium baseline around 133 Clinically dry -- sodium improved   Overall appears near baseline. D/c home today. Pt  agreeable   Post Hospital Acute Care Issue to be followed in the Clinic   Principal Problem:   Acute on chronic respiratory failure with hypoxia (HCC) Active Problems:   Hyponatremia   Coronary artery disease   Carotid stenosis   (HFpEF) heart failure with preserved ejection fraction (HCC)   Subjective:   Christina Hester today has, No headache, No chest pain, No abdominal pain - No Nausea, No new weakness tingling or numbness, No Cough - SOB.   Assessment & Plan    1. Hospital discharge follow-up Treated in the hospital for acute on chronic respiratory failure with hypoxia. She does have to wear oxygen sometimes.   2. Acute on chronic respiratory failure with hypoxia (HCC) Stabilized at this time, would benefit from pulmonary rehab and being able to participate in a rehab program remotely from home would be the most conducive to her lifestyle and current medical status. She does see Dr. Karna Christmas for pulmonology. He is managing her medications and she is taking optimal therapy. She remains consistently short of breath and would benefit from adding pulmonary rehab to her treatment s/p hospitalization for respiratory failure. Referred to remote pulmonary rehab with [facility]  3. Advanced COPD (HCC) Currently using prn albuterol rescue inhaler, takes daily oral prednisone as prescribed and takes prn duoneb treatments. She has previously tried trelegy, breztri, yupelri and multiple other maintenance inhalers and reports that they have either not helped at all or caused side effects that she did not tolerate.  Referred to remote pulmonary rehab with [facility]  4. SOB (shortness of breath) Referred to remote pulmonary rehab with [facility]  5. Generalized anxiety disorder Continue prn lorazepam as prescribed, her anxiety also contributes to her SOB.    Reason for frequent admissions/ER visits    COPD, respiratory failure Heart failure Carotid  stenosis Hypertension Emphysema GAD    Objective:   Vitals:   12/06/22 1103  BP: 134/80  Pulse: 60  Resp: 16  Temp: 98.2 F (36.8 C)  SpO2: 98%  Weight: 68 lb 12.8 oz (31.2 kg)  Height: 4\' 11"  (1.499 m)    Wt Readings from Last 3 Encounters:  12/06/22 68 lb 12.8 oz (31.2 kg)  11/25/22 65 lb 11.2 oz (29.8 kg)  10/02/22 71 lb (32.2 kg)    Allergies as of 12/06/2022       Reactions   Hydrocodone-acetaminophen Other (See Comments)   Facial numbness   Aspirin Tinitus   Budesonide-formoterol Fumarate Other (See Comments)   Patient felt "out of her mind" and angry. Patient felt "out of her mind" and angry.   Prednisone Other (See Comments)  Makes violent. Patient tolerates low doses   Sulfa Antibiotics Nausea And Vomiting   Tramadol Other (See Comments)        Medication List        Accurate as of Dec 06, 2022 11:59 PM. If you have any questions, ask your nurse or doctor.          amLODipine 5 MG tablet Commonly known as: NORVASC Take 5 mg by mouth daily.   azithromycin 250 MG tablet Commonly known as: ZITHROMAX Take 1 tablet by mouth every Monday, Wednesday, and Friday.   baclofen 10 MG tablet Commonly known as: LIORESAL Take 0.5 tablets (5 mg total) by mouth 3 (three) times daily as needed for muscle spasms.   bisoprolol 5 MG tablet Commonly known as: ZEBETA Take 1 tablet (5 mg total) by mouth daily.   buPROPion 100 MG tablet Commonly known as: WELLBUTRIN Take 1 tablet (100 mg total) by mouth daily.   clopidogrel 75 MG tablet Commonly known as: PLAVIX Take 1 tablet (75 mg total) by mouth daily.   clotrimazole 10 MG troche Commonly known as: MYCELEX Take 10 mg by mouth as needed (thrush).   ipratropium-albuterol 0.5-2.5 (3) MG/3ML Soln Commonly known as: DUONEB USE 1 VIAL IN NEBULIZER EVERY 4 HOURS   LORazepam 0.5 MG tablet Commonly known as: ATIVAN TAKE 1/2 TO 1 TABLET BY MOUTH EVERY 8 HOURS AS NEEDED FOR ANXIETY OR SLEEP    meloxicam 15 MG tablet Commonly known as: MOBIC Take 1 tablet (15 mg total) by mouth daily.   nitroGLYCERIN 0.4 MG SL tablet Commonly known as: NITROSTAT Place 1 tablet (0.4 mg total) under the tongue every 5 (five) minutes as needed for chest pain.   nystatin 100000 UNIT/ML suspension Commonly known as: MYCOSTATIN Take 5 mLs (500,000 Units total) by mouth 4 (four) times daily. What changed:  when to take this reasons to take this   ondansetron 4 MG tablet Commonly known as: ZOFRAN Take 1 tablet (4 mg total) by mouth every 8 (eight) hours as needed for nausea or vomiting.   pantoprazole 40 MG tablet Commonly known as: PROTONIX Take 1 tablet (40 mg total) by mouth daily.   predniSONE 1 MG tablet Commonly known as: DELTASONE Take 2 mg by mouth daily with breakfast.   Ventolin HFA 108 (90 Base) MCG/ACT inhaler Generic drug: albuterol INHALE 2 PUFFS INTO THE LUNGS EVERY 6 HOURS AS NEEDED FOR WHEEZING OR SHORTNESS OF BREATH.         Physical Exam: Constitutional: Patient appears well-developed and well-nourished. Not in obvious distress. HENT: Normocephalic, atraumatic, External right and left ear normal. Oropharynx is clear and moist.  Eyes: Conjunctivae and EOM are normal. PERRLA, no scleral icterus. Neck: Normal ROM. Neck supple. No JVD. No tracheal deviation. No thyromegaly. CVS: RRR, S1/S2 +, no murmurs, no gallops, no carotid bruit.  Pulmonary: Effort and breath sounds normal, no stridor, rhonchi, wheezes, rales.  Abdominal: Soft. BS +, no distension, tenderness, rebound or guarding.  Musculoskeletal: Normal range of motion. No edema and no tenderness.  Lymphadenopathy: No lymphadenopathy noted, cervical, inguinal or axillary Neuro: Alert. Normal reflexes, muscle tone coordination. No cranial nerve deficit. Skin: Skin is warm and dry. No rash noted. Not diaphoretic. No erythema. No pallor. Psychiatric: Normal mood and affect. Behavior, judgment, thought content  normal.   Data Review   Micro Results No results found for this or any previous visit (from the past 240 hour(s)).    CBC No results for input(s): "WBC", "HGB", "HCT", "  PLT", "MCV", "MCH", "MCHC", "RDW", "LYMPHSABS", "MONOABS", "EOSABS", "BASOSABS", "BANDABS" in the last 168 hours.  Invalid input(s): "NEUTRABS", "BANDSABD"  Chemistries  No results for input(s): "NA", "K", "CL", "CO2", "GLUCOSE", "BUN", "CREATININE", "CALCIUM", "MG", "AST", "ALT", "ALKPHOS", "BILITOT" in the last 168 hours.  Invalid input(s): "GFRCGP" ------------------------------------------------------------------------------------------------------------------ estimated creatinine clearance is 27.6 mL/min (by C-G formula based on SCr of 0.76 mg/dL). ------------------------------------------------------------------------------------------------------------------ No results for input(s): "HGBA1C" in the last 72 hours. ------------------------------------------------------------------------------------------------------------------ No results for input(s): "CHOL", "HDL", "LDLCALC", "TRIG", "CHOLHDL", "LDLDIRECT" in the last 72 hours. ------------------------------------------------------------------------------------------------------------------ No results for input(s): "TSH", "T4TOTAL", "T3FREE", "THYROIDAB" in the last 72 hours.  Invalid input(s): "FREET3" ------------------------------------------------------------------------------------------------------------------ No results for input(s): "VITAMINB12", "FOLATE", "FERRITIN", "TIBC", "IRON", "RETICCTPCT" in the last 72 hours.  Coagulation profile No results for input(s): "INR", "PROTIME" in the last 168 hours.  No results for input(s): "DDIMER" in the last 72 hours.  Cardiac Enzymes No results for input(s): "CKMB", "TROPONINI", "MYOGLOBIN" in the last 168 hours.  Invalid input(s):  "CK" ------------------------------------------------------------------------------------------------------------------ Invalid input(s): "POCBNP"  Return for previously scheduled, [facility], Crystale Giannattasio PCP in july.   Time Spent in minutes  45 Time spent with patient included reviewing progress notes, labs, imaging studies, and discussing plan for follow up.   This patient was seen by Sallyanne Kuster, FNP-C in collaboration with Dr. Beverely Risen as a part of collaborative care agreement.    Sallyanne Kuster MSN, FNP-C on 12/06/2022 at 11:37 AM   **Disclaimer: This note may have been dictated with voice recognition software. Similar sounding words can inadvertently be transcribed and this note may contain transcription errors which may not have been corrected upon publication of note.**

## 2022-12-16 ENCOUNTER — Encounter: Payer: Self-pay | Admitting: Nurse Practitioner

## 2022-12-26 ENCOUNTER — Ambulatory Visit: Payer: Medicare Other | Admitting: Nurse Practitioner

## 2023-01-09 ENCOUNTER — Ambulatory Visit: Payer: Medicare Other | Attending: Cardiovascular Disease | Admitting: Cardiovascular Disease

## 2023-01-09 VITALS — BP 124/72 | HR 86 | Ht 60.0 in | Wt 73.0 lb

## 2023-01-09 DIAGNOSIS — I2510 Atherosclerotic heart disease of native coronary artery without angina pectoris: Secondary | ICD-10-CM | POA: Insufficient documentation

## 2023-01-09 DIAGNOSIS — I10 Essential (primary) hypertension: Secondary | ICD-10-CM | POA: Diagnosis not present

## 2023-01-09 DIAGNOSIS — E785 Hyperlipidemia, unspecified: Secondary | ICD-10-CM | POA: Diagnosis not present

## 2023-01-09 MED ORDER — PANTOPRAZOLE SODIUM 40 MG PO TBEC: 40.0000 mg | DELAYED_RELEASE_TABLET | Freq: Every day | ORAL | 3 refills | Status: DC

## 2023-01-09 NOTE — Progress Notes (Unsigned)
Cardiology Office Note   Date:  01/09/2023   ID:  Christina Hester, DOB 03-May-1941, MRN [id number]  PCP:  Sallyanne Kuster, NP  Cardiologist:   Lorine Bears, MD   Chief Complaint  Patient presents with   Follow-up    6 month follow up. Patient c.o of pain in the center of her chest. Meds reviewed verbally with patient.       History of Present Illness: Christina Hester is a 82 y.o. female who presents for a follow-up visit regarding coronary artery disease.   She has known history of COPD , prolonged tobacco use, moderate bilateral carotid disease and abdominal aortic aneurysm followed by [facility]..   Cardiac catheterization was done in August 2018 which showed severe one-vessel coronary artery disease with chronically occluded right coronary artery with well-developed left-to-right collaterals. There was mild to moderate nonobstructive disease affecting the left coronary arteries with extremely tortuous vessels. Ejection fraction was normal with mildly elevated left ventricular end-diastolic pressure. She is intolerant to aspirin due to ringing in her ears.  She has been on Plavix.  She refuses to take statins due to fear of side effects and stopped Zetia due to leg pain.    She had multiple hospitalizations for COPD exasperation and finally quit smoking few months ago.  She uses oxygen at home.  She has occasional heartburn but no exertional chest pain.  She has chronic exertional dyspnea.  She is concerned about blue discoloration in the left foot.  No claudication.  Past Medical History:  Diagnosis Date   AAA (abdominal aortic aneurysm) (HCC)    a. 05/2016 Abd U/S: 3.02 x 3.02 AAA.   Anxiety    CAD (coronary artery disease)    a. 02/2017 NSTEMI/Cath: LM nl, LAD mild dzs, D2 min irregs, LCX mild dzs, OM2 50ost, RCA 95p, 111m, L-->R collats, EF 55-65%.   COPD (chronic obstructive pulmonary disease) (HCC)    Diastolic dysfunction    a. 02/2017 Echo: EF 55-60%, gr1 DD, ? inf HK,  mild MR.   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 05/2016 ABI's: R 1.22, L 1.11.   Tobacco abuse     Past Surgical History:  Procedure Laterality Date   ABDOMINAL HYSTERECTOMY     APPENDECTOMY     BREAST EXCISIONAL BIOPSY Left 80's or 90's   NEG   CARDIAC CATHETERIZATION     COLONOSCOPY N/A 05/22/2018   Procedure: COLONOSCOPY;  Surgeon: Toney Reil, MD;  Location: [facility] ENDOSCOPY;  Service: Gastroenterology;  Laterality: N/A;   COLONOSCOPY WITH PROPOFOL N/A 07/19/2020   Procedure: COLONOSCOPY WITH PROPOFOL;  Surgeon: Toledo, Boykin Nearing, MD;  Location: [facility] ENDOSCOPY;  Service: Gastroenterology;  Laterality: N/A;   ENDARTERECTOMY Right 11/06/2019   Procedure: ENDARTERECTOMY CAROTID;  Surgeon: Annice Needy, MD;  Location: [facility] ORS;  Service: Vascular;  Laterality: Right;   LEFT HEART CATH Bilateral 03/05/2017   Procedure: Left Heart Cath with poss PCI;  Surgeon: Iran Ouch, MD;  Location: [facility] INV[facility]IVE CV LAB;  Service: Cardiovascular;  Laterality: Bilateral;     Current Outpatient Medications  Medication Sig Dispense Refill   amLODipine (NORVASC) 5 MG tablet Take 5 mg by mouth daily.     azithromycin (ZITHROMAX) 250 MG tablet Take 1 tablet by mouth every Monday,  Wednesday, and Friday.     baclofen (LIORESAL) 10 MG tablet Take 0.5 tablets (5 mg total) by mouth 3 (three) times daily as needed for muscle spasms. 90 tablet 1   bisoprolol (ZEBETA) 5 MG tablet Take 1 tablet (5 mg total) by mouth daily. 90 tablet 3   buPROPion (WELLBUTRIN) 100 MG tablet Take 1 tablet (100 mg total) by mouth daily. 90 tablet 3   clopidogrel (PLAVIX) 75 MG tablet Take 1 tablet (75 mg total) by mouth daily. 90 tablet 3   clotrimazole (MYCELEX) 10 MG troche Take 10 mg by mouth as needed (thrush).     ipratropium-albuterol (DUONEB) 0.5-2.5 (3) MG/3ML SOLN  USE 1 VIAL IN NEBULIZER EVERY 4 HOURS 120 mL 1   LORazepam (ATIVAN) 0.5 MG tablet TAKE 1/2 TO 1 TABLET BY MOUTH EVERY 8 HOURS AS NEEDED FOR ANXIETY OR SLEEP 60 tablet 2   meloxicam (MOBIC) 15 MG tablet Take 1 tablet (15 mg total) by mouth daily. 30 tablet 2   nitroGLYCERIN (NITROSTAT) 0.4 MG SL tablet Place 1 tablet (0.4 mg total) under the tongue every 5 (five) minutes as needed for chest pain. 20 tablet 12   nystatin (MYCOSTATIN) 100000 UNIT/ML suspension Take 5 mLs (500,000 Units total) by mouth 4 (four) times daily. (Patient taking differently: Take 5 mLs by mouth as needed (thrush).) 200 mL 0   ondansetron (ZOFRAN) 4 MG tablet Take 1 tablet (4 mg total) by mouth every 8 (eight) hours as needed for nausea or vomiting. 20 tablet 0   predniSONE (DELTASONE) 1 MG tablet Take 3 mg by mouth daily with breakfast.     VENTOLIN HFA 108 (90 Base) MCG/ACT inhaler INHALE 2 PUFFS INTO THE LUNGS EVERY 6 HOURS AS NEEDED FOR WHEEZING OR SHORTNESS OF BREATH. 18 g 3   pantoprazole (PROTONIX) 40 MG tablet Take 1 tablet (40 mg total) by mouth daily. 90 tablet 3   No current facility-administered medications for this visit.    Allergies:   Hydrocodone-acetaminophen, Aspirin, Budesonide-formoterol fumarate, Prednisone, Sulfa antibiotics, and Tramadol    Social History:  The patient  reports that she quit smoking about 5 years ago. Her smoking use included cigarettes. She smoked an average of .25 packs per day. She has never used smokeless tobacco. She reports that she does not drink alcohol and does not use drugs.   Family History:  The patient's family history includes Breast cancer in her maternal aunt, maternal aunt, maternal aunt, and mother; CVA in her father; Dementia in her mother; Hypertension in her mother.    ROS:  Please see the history of present illness.   Otherwise, review of systems are positive for none.   All other systems are reviewed and negative.    PHYSICAL EXAM: VS:  BP 124/72 (BP  Location: Left Arm, Patient Position: Sitting, Cuff Size: Normal)   Pulse 86   Ht 5' (1.524 m)   Wt 73 lb (33.1 kg)   BMI 14.26 kg/m  , BMI Body mass index is 14.26 kg/m. GEN: Well nourished, well developed, in no acute distress  HEENT: normal  Neck: no JVD, carotid bruits, or masses Cardiac: RRR; no murmurs, rubs, or gallops,no edema  Respiratory:  clear to auscultation bilaterally, normal work of breathing GI: soft, nontender, nondistended, + BS MS: no deformity or atrophy  Skin: warm and dry, no rash Neuro:  Strength and sensation are intact Psych: euthymic mood, full affect Distal pulses in the left foot are normal with some cyanosis and prominent veins.  EKG:  EKG is ordered today. The ekg ordered today demonstrates's normal sinus rhythm with no significant ST or T wave changes.  Recent Labs: 11/25/2022: B Natriuretic Peptide 85.7 11/26/2022: ALT 29 11/27/2022: Hemoglobin 12.3; Platelets 187 11/28/2022: BUN 23; Creatinine, Ser 0.76; Magnesium 2.0; Potassium 3.5; Sodium 129    Lipid Panel    Component Value Date/Time   CHOL 265 (H) 04/04/2022 0746   TRIG 87 04/04/2022 0746   HDL 106 04/04/2022 0746   CHOLHDL 2.5 04/04/2022 0746   CHOLHDL 2.6 11/26/2015 0412   VLDL 6 11/26/2015 0412   LDLCALC 145 (H) 04/04/2022 0746      Wt Readings from Last 3 Encounters:  01/09/23 73 lb (33.1 kg)  12/06/22 68 lb 12.8 oz (31.2 kg)  11/25/22 65 lb 11.2 oz (29.8 kg)          No data to display            ASSESSMENT AND PLAN:  1.  Coronary artery disease involving native coronary arteries with other forms of angina: Symptoms are well controlled with metoprolol and amlodipine.  Continue same medications.  Continue Plavix long-term given that she is allergic to aspirin.  2. Hyperlipidemia: She continues to refuse statins due to fear of side effects and she did not tolerate [place].  She refuses anticholesterol medications.  3. Tobacco use: I congratulated her on smoking  cessation.  4. Essential hypertension: Blood pressure well-controlled.  5.  Discoloration of the left foot: Likely due to chronic venous insufficiency.  She has palpable DP and PT pulses.    Disposition:   FU with me in 6 months  Signed,  Lorine Bears, MD  01/09/2023 4:14 PM    [facility]

## 2023-01-09 NOTE — Patient Instructions (Signed)
Medication Instructions:  No changes *If you need a refill on your cardiac medications before your next appointment, please call your pharmacy*   Lab Work: None ordered If you have labs (blood work) drawn today and your tests are completely normal, you will receive your results only by: MyChart Message (if you have MyChart) OR A paper copy in the mail If you have any lab test that is abnormal or we need to change your treatment, we will call you to review the results.   Testing/Procedures: None ordered   Follow-Up: At [facility], you and your health needs are our priority.  As part of our continuing mission to provide you with exceptional heart care, we have created designated Provider Care Teams.  These Care Teams include your primary Cardiologist (physician) and Advanced Practice Providers (APPs -  Physician Assistants and Nurse Practitioners) who all work together to provide you with the care you need, when you need it.  We recommend signing up for the patient portal called "MyChart".  Sign up information is provided on this After Visit Summary.  MyChart is used to connect with patients for Virtual Visits (Telemedicine).  Patients are able to view lab/test results, encounter notes, upcoming appointments, etc.  Non-urgent messages can be sent to your provider as well.   To learn more about what you can do with MyChart, go to [email or web address].    Your next appointment:   6 month(s)  Provider:   You may see Muhammad Arida, MD or one of the following Advanced Practice Providers on your designated Care Team:   Christopher Berge, NP Ryan Dunn, PA-C Cadence Furth, PA-C Sheri Hammock, NP    

## 2023-01-11 ENCOUNTER — Encounter: Payer: Self-pay | Admitting: Cardiovascular Disease

## 2023-01-25 ENCOUNTER — Ambulatory Visit (INDEPENDENT_AMBULATORY_CARE_PROVIDER_SITE_OTHER): Payer: Medicare Other | Admitting: Nurse Practitioner

## 2023-01-25 ENCOUNTER — Encounter: Payer: Self-pay | Admitting: Nurse Practitioner

## 2023-01-25 VITALS — BP 130/60 | HR 77 | Temp 98.6°F | Resp 16 | Ht 60.0 in | Wt 72.6 lb

## 2023-01-25 DIAGNOSIS — J449 Chronic obstructive pulmonary disease, unspecified: Secondary | ICD-10-CM | POA: Diagnosis not present

## 2023-01-25 DIAGNOSIS — Z0001 Encounter for general adult medical examination with abnormal findings: Secondary | ICD-10-CM

## 2023-01-25 DIAGNOSIS — I2510 Atherosclerotic heart disease of native coronary artery without angina pectoris: Secondary | ICD-10-CM

## 2023-01-25 DIAGNOSIS — F411 Generalized anxiety disorder: Secondary | ICD-10-CM | POA: Diagnosis not present

## 2023-01-25 DIAGNOSIS — R300 Dysuria: Secondary | ICD-10-CM

## 2023-01-25 DIAGNOSIS — M2550 Pain in unspecified joint: Secondary | ICD-10-CM

## 2023-01-25 DIAGNOSIS — E782 Mixed hyperlipidemia: Secondary | ICD-10-CM | POA: Diagnosis not present

## 2023-01-25 DIAGNOSIS — J439 Emphysema, unspecified: Secondary | ICD-10-CM

## 2023-01-25 DIAGNOSIS — E871 Hypo-osmolality and hyponatremia: Secondary | ICD-10-CM

## 2023-01-25 DIAGNOSIS — I6523 Occlusion and stenosis of bilateral carotid arteries: Secondary | ICD-10-CM | POA: Diagnosis not present

## 2023-01-25 DIAGNOSIS — I10 Essential (primary) hypertension: Secondary | ICD-10-CM

## 2023-01-25 DIAGNOSIS — I200 Unstable angina: Secondary | ICD-10-CM

## 2023-01-25 MED ORDER — IPRATROPIUM-ALBUTEROL 0.5-2.5 (3) MG/3ML IN SOLN: RESPIRATORY_TRACT | 1 refills | Status: DC

## 2023-01-25 MED ORDER — CLOTRIMAZOLE 10 MG MT TROC
10.0000 mg | Freq: Every day | OROMUCOSAL | 0 refills | Status: AC | PRN
Start: 2023-01-25 — End: ?

## 2023-01-25 MED ORDER — LORAZEPAM 0.5 MG PO TABS
ORAL_TABLET | ORAL | 2 refills | Status: AC
Start: 2023-01-25 — End: ?

## 2023-01-25 MED ORDER — CLOPIDOGREL BISULFATE 75 MG PO TABS: 75.0000 mg | ORAL_TABLET | Freq: Every day | ORAL | 3 refills | Status: DC

## 2023-01-25 MED ORDER — VENTOLIN HFA 108 (90 BASE) MCG/ACT IN AERS: INHALATION_SPRAY | RESPIRATORY_TRACT | 3 refills | Status: DC

## 2023-01-25 MED ORDER — MELOXICAM 15 MG PO TABS
15.0000 mg | ORAL_TABLET | Freq: Every day | ORAL | 2 refills | Status: AC
Start: 2023-01-25 — End: ?

## 2023-01-25 MED ORDER — BUPROPION HCL 100 MG PO TABS: 100.0000 mg | ORAL_TABLET | Freq: Every day | ORAL | 3 refills | Status: DC

## 2023-01-25 MED ORDER — BISOPROLOL FUMARATE 5 MG PO TABS: 5.0000 mg | ORAL_TABLET | Freq: Every day | ORAL | 3 refills | Status: DC

## 2023-01-25 MED ORDER — AMLODIPINE BESYLATE 5 MG PO TABS
5.0000 mg | ORAL_TABLET | Freq: Every day | ORAL | 3 refills | Status: DC
Start: 2023-01-25 — End: 2023-12-26

## 2023-01-25 NOTE — Progress Notes (Signed)
[facility] [address] [place], [place] [address]  Internal MEDICINE  Office Visit Note  Patient Name: Christina Hester  [id number]  [id number]  Date of Service: 01/25/2023  Chief Complaint  Patient presents with   Hyperlipidemia   Hypertension   Medicare Wellness    HPI Fatima presents for an annual well visit and physical exam.  Well-appearing 82 y.o. female with anxiety, advanced COPD, hyperlipidemia, hypertension, AAA, history of skin cancer, CAD, hx of MI, PAD.  Continues to live at home with her daughter.  Labs: due for lipid panel and need to recheck her sodium level New or worsening pain: none Other concerns: due for refills, not due for any preventive screenings.      01/25/2023   11:08 AM 01/19/2022   11:14 AM 01/18/2021   11:34 AM  MMSE - Mini Mental State Exam  Orientation to time 5 5 5   Orientation to Place 5 5 5   Registration 3 3 3   Attention/ Calculation 5 5 5   Recall 3 3 3   Language- name 2 objects 2 2 2   Language- repeat 1 1 1   Language- follow 3 step command 3 3 3   Language- read & follow direction 1 1 1   Write a sentence 1 1 1   Copy design 1 1 1   Total score 30 30 30     Functional Status Survey: Is the patient deaf or have difficulty hearing?: No Does the patient have difficulty seeing, even when wearing glasses/contacts?: No Does the patient have difficulty concentrating, remembering, or making decisions?: No Does the patient have difficulty walking or climbing stairs?: No Does the patient have difficulty dressing or bathing?: No Does the patient have difficulty doing errands alone such as visiting a doctor's office or shopping?: No     01/19/2022   11:12 AM 04/20/2022    9:50 AM 06/09/2022    8:35 PM 09/04/2022   11:56 AM 01/25/2023   11:07 AM  Fall Risk  Falls in the past year? 0 0  0 0  Was there an injury with Fall?  0  0 0  Fall Risk Category Calculator  0  0 0  Fall Risk Category (Retired)  Low     (RETIRED) Patient Fall  Risk Level Low fall risk Low fall risk Low fall risk    Patient at Risk for Falls Due to No Fall Risks No Fall Risks  No Fall Risks No Fall Risks  Fall risk Follow up Falls evaluation completed Falls evaluation completed  Falls evaluation completed Falls evaluation completed       01/25/2023   11:07 AM  Depression screen PHQ 2/9  Decreased Interest 0  Down, Depressed, Hopeless 0  PHQ - 2 Score 0        Current Medication: Outpatient Encounter Medications as of 01/25/2023  Medication Sig   azithromycin (ZITHROMAX) 250 MG tablet Take 1 tablet by mouth every Monday, Wednesday, and Friday.   baclofen (LIORESAL) 10 MG tablet Take 0.5 tablets (5 mg total) by mouth 3 (three) times daily as needed for muscle spasms.   nitroGLYCERIN (NITROSTAT) 0.4 MG SL tablet Place 1 tablet (0.4 mg total) under the tongue every 5 (five) minutes as needed for chest pain.   nystatin (MYCOSTATIN) 100000 UNIT/ML suspension Take 5 mLs (500,000 Units total) by mouth 4 (four) times daily. (Patient taking differently: Take 5 mLs by mouth as needed (thrush).)   ondansetron (ZOFRAN) 4 MG tablet Take 1 tablet (4 mg total) by mouth every 8 (  eight) hours as needed for nausea or vomiting.   pantoprazole (PROTONIX) 40 MG tablet Take 1 tablet (40 mg total) by mouth daily.   predniSONE (DELTASONE) 1 MG tablet Take 3 mg by mouth daily with breakfast.   [DISCONTINUED] amLODipine (NORVASC) 5 MG tablet Take 5 mg by mouth daily.   [DISCONTINUED] bisoprolol (ZEBETA) 5 MG tablet Take 1 tablet (5 mg total) by mouth daily.   [DISCONTINUED] buPROPion (WELLBUTRIN) 100 MG tablet Take 1 tablet (100 mg total) by mouth daily.   [DISCONTINUED] clopidogrel (PLAVIX) 75 MG tablet Take 1 tablet (75 mg total) by mouth daily.   [DISCONTINUED] clotrimazole (MYCELEX) 10 MG troche Take 10 mg by mouth as needed (thrush).   [DISCONTINUED] ipratropium-albuterol (DUONEB) 0.5-2.5 (3) MG/3ML SOLN USE 1 VIAL IN NEBULIZER EVERY 4 HOURS   [DISCONTINUED]  LORazepam (ATIVAN) 0.5 MG tablet TAKE 1/2 TO 1 TABLET BY MOUTH EVERY 8 HOURS AS NEEDED FOR ANXIETY OR SLEEP   [DISCONTINUED] meloxicam (MOBIC) 15 MG tablet Take 1 tablet (15 mg total) by mouth daily.   [DISCONTINUED] VENTOLIN HFA 108 (90 Base) MCG/ACT inhaler INHALE 2 PUFFS INTO THE LUNGS EVERY 6 HOURS AS NEEDED FOR WHEEZING OR SHORTNESS OF BREATH.   amLODipine (NORVASC) 5 MG tablet Take 1 tablet (5 mg total) by mouth daily.   bisoprolol (ZEBETA) 5 MG tablet Take 1 tablet (5 mg total) by mouth daily.   buPROPion (WELLBUTRIN) 100 MG tablet Take 1 tablet (100 mg total) by mouth daily.   clopidogrel (PLAVIX) 75 MG tablet Take 1 tablet (75 mg total) by mouth daily.   clotrimazole (MYCELEX) 10 MG troche Take 1 tablet (10 mg total) by mouth daily as needed (thrush).   ipratropium-albuterol (DUONEB) 0.5-2.5 (3) MG/3ML SOLN USE 1 VIAL IN NEBULIZER EVERY 4 HOURS   LORazepam (ATIVAN) 0.5 MG tablet TAKE 1/2 TO 1 TABLET BY MOUTH EVERY 8 HOURS AS NEEDED FOR ANXIETY OR SLEEP   meloxicam (MOBIC) 15 MG tablet Take 1 tablet (15 mg total) by mouth daily.   VENTOLIN HFA 108 (90 Base) MCG/ACT inhaler INHALE 2 PUFFS INTO THE LUNGS EVERY 6 HOURS AS NEEDED FOR WHEEZING OR SHORTNESS OF BREATH.   No facility-administered encounter medications on file as of 01/25/2023.    Surgical History: Past Surgical History:  Procedure Laterality Date   ABDOMINAL HYSTERECTOMY     APPENDECTOMY     BREAST EXCISIONAL BIOPSY Left 80's or 90's   NEG   CARDIAC CATHETERIZATION     COLONOSCOPY N/A 05/22/2018   Procedure: COLONOSCOPY;  Surgeon: Toney Reil, MD;  Location: [facility] ENDOSCOPY;  Service: Gastroenterology;  Laterality: N/A;   COLONOSCOPY WITH PROPOFOL N/A 07/19/2020   Procedure: COLONOSCOPY WITH PROPOFOL;  Surgeon: Toledo, Boykin Nearing, MD;  Location: [facility] ENDOSCOPY;  Service: Gastroenterology;  Laterality: N/A;   ENDARTERECTOMY Right 11/06/2019   Procedure: ENDARTERECTOMY CAROTID;  Surgeon: Annice Needy, MD;  Location: [facility]  ORS;  Service: Vascular;  Laterality: Right;   LEFT HEART CATH Bilateral 03/05/2017   Procedure: Left Heart Cath with poss PCI;  Surgeon: Iran Ouch, MD;  Location: [facility] INVASIVE CV LAB;  Service: Cardiovascular;  Laterality: Bilateral;    Medical History: Past Medical History:  Diagnosis Date   AAA (abdominal aortic aneurysm) (HCC)    a. 05/2016 Abd U/S: 3.02 x 3.02 AAA.   Anxiety    CAD (coronary artery disease)    a. 02/2017 NSTEMI/Cath: LM nl, LAD mild dzs, D2 min irregs, LCX mild dzs, OM2 50ost, RCA 95p, 173m, L-->R collats,  EF 55-65%.   COPD (chronic obstructive pulmonary disease) (HCC)    Diastolic dysfunction    a. 02/2017 Echo: EF 55-60%, gr1 DD, ? inf HK, mild MR.   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 05/2016 ABI's: R 1.22, L 1.11.   Tobacco abuse     Family History: Family History  Problem Relation Age of Onset   Breast cancer Mother    Dementia Mother    Hypertension Mother    Breast cancer Maternal Aunt    Breast cancer Maternal Aunt    Breast cancer Maternal Aunt    CVA Father     Social History   Socioeconomic History   Marital status: Divorced    Spouse name: Not on file   Number of children: Not on file   Years of education: Not on file   Highest education level: Not on file  Occupational History   Not on file  Tobacco Use   Smoking status: Former    Current packs/day: 0.00    Types: Cigarettes    Quit date: 07/12/2017    Years since quitting: 5.5   Smokeless tobacco: Never   Tobacco comments:    currently 90 days without smoking  Vaping Use   Vaping status: Never Used  Substance and Sexual Activity   Alcohol use: No   Drug use: No   Sexual activity: Not on file  Other Topics Concern   Not on file  Social History Narrative   Not on file   Social Determinants of Health   Financial  Resource Strain: Not on file  Food Insecurity: No Food Insecurity (11/26/2022)   Hunger Vital Sign    Worried About Running Out of Food in the Last Year: Never true    Ran Out of Food in the Last Year: Never true  Transportation Needs: No Transportation Needs (11/26/2022)   PRAPARE - Administrator, Civil Service (Medical): No    Lack of Transportation (Non-Medical): No  Physical Activity: Not on file  Stress: Not on file  Social Connections: Not on file  Intimate Partner Violence: Not At Risk (11/26/2022)   Humiliation, Afraid, Rape, and Kick questionnaire    Fear of Current or Ex-Partner: No    Emotionally Abused: No    Physically Abused: No    Sexually Abused: No      Review of Systems  Constitutional:  Negative for activity change, appetite change, chills, fatigue, fever and unexpected weight change.  HENT: Negative.  Negative for congestion, ear pain, rhinorrhea, sore throat and trouble swallowing.   Eyes: Negative.   Respiratory:  Positive for cough, chest tightness, shortness of breath and wheezing (intermittent).   Cardiovascular: Negative.  Negative for chest pain and palpitations.  Gastrointestinal: Negative.  Negative for abdominal pain, blood in stool, constipation, diarrhea, nausea and vomiting.  Endocrine: Negative.   Genitourinary: Negative.  Negative for difficulty urinating, dysuria, frequency, hematuria and urgency.  Musculoskeletal:  Positive for arthralgias. Negative for back pain, joint swelling, myalgias and neck pain.  Skin: Negative.  Negative for rash and wound.  Allergic/Immunologic: Negative.  Negative for immunocompromised state.  Neurological: Negative.  Negative for dizziness, seizures, weakness, numbness and headaches.  Hematological: Negative.   Psychiatric/Behavioral:  Positive for sleep disturbance. Negative for behavioral problems, self-injury and  suicidal ideas. The patient is nervous/anxious.     Vital Signs: BP (!) 140/62   Pulse  77   Temp 98.6 F (37 C)   Resp 16   Ht 5' (1.524 m)   Wt 72 lb 9.6 oz (32.9 kg)   SpO2 95%   BMI 14.18 kg/m    Physical Exam Vitals reviewed.  Constitutional:      General: She is awake. She is not in acute distress.    Appearance: Normal appearance. She is well-developed and well-groomed. She is obese. She is not ill-appearing or diaphoretic.  HENT:     Head: Normocephalic and atraumatic.     Right Ear: Tympanic membrane, ear canal and external ear normal. There is no impacted cerumen.     Left Ear: Tympanic membrane, ear canal and external ear normal. There is no impacted cerumen.     Nose: Nose normal. No congestion or rhinorrhea.     Mouth/Throat:     Lips: Pink.     Mouth: Mucous membranes are moist.     Pharynx: Oropharynx is clear. Uvula midline. No oropharyngeal exudate or posterior oropharyngeal erythema.  Eyes:     General: Lids are normal. Vision grossly intact. Gaze aligned appropriately. No scleral icterus.       Right eye: No discharge.        Left eye: No discharge.     Conjunctiva/sclera: Conjunctivae normal.     Pupils: Pupils are equal, round, and reactive to light.     Funduscopic exam:    Right eye: Red reflex present.        Left eye: Red reflex present. Neck:     Thyroid: No thyromegaly.     Vascular: No JVD.     Trachea: Trachea and phonation normal. No tracheal deviation.  Cardiovascular:     Rate and Rhythm: Normal rate and regular rhythm.     Pulses: Normal pulses.     Heart sounds: Normal heart sounds, S1 normal and S2 normal. No murmur heard.    No friction rub. No gallop.  Pulmonary:     Effort: Pulmonary effort is normal. No accessory muscle usage or respiratory distress.     Breath sounds: Normal air entry. No stridor. Examination of the right-lower field reveals decreased breath sounds. Examination of the left-lower field reveals decreased breath sounds. Decreased breath sounds present. No wheezing or rales.  Chest:     Chest wall: No  tenderness.     Comments: Declined breast exam.  Abdominal:     General: Abdomen is flat. Bowel sounds are normal. There is no distension.     Palpations: Abdomen is soft. There is no shifting dullness, fluid wave, mass or pulsatile mass.     Tenderness: There is no abdominal tenderness. There is no guarding or rebound.  Musculoskeletal:        General: No tenderness or deformity. Normal range of motion.     Cervical back: Normal range of motion and neck supple.     Right lower leg: No edema.     Left lower leg: No edema.  Lymphadenopathy:     Cervical: No cervical adenopathy.  Skin:    General: Skin is warm and dry.     Capillary Refill: Capillary refill takes less than 2 seconds.     Coloration: Skin is not pale.     Findings: No erythema or rash.  Neurological:     Mental Status: She is alert and oriented to person, place, and time.  Cranial Nerves: No cranial nerve deficit.     Motor: No abnormal muscle tone.     Coordination: Coordination normal.     Gait: Gait normal.     Deep Tendon Reflexes: Reflexes are normal and symmetric.  Psychiatric:        Mood and Affect: Mood normal.        Behavior: Behavior normal. Behavior is cooperative.        Thought Content: Thought content normal.        Judgment: Judgment normal.        Assessment/Plan: 1. Encounter for general adult medical examination with abnormal findings Age-appropriate preventive screenings and vaccinations discussed, annual physical exam completed. Routine labs for health maintenance ordered, see below. PHM updated. Routine medication refills ordered.  - clotrimazole (MYCELEX) 10 MG troche; Take 1 tablet (10 mg total) by mouth daily as needed (thrush).  Dispense: 70 Troche; Refill: 0 - buPROPion (WELLBUTRIN) 100 MG tablet; Take 1 tablet (100 mg total) by mouth daily.  Dispense: 90 tablet; Refill: 3 - meloxicam (MOBIC) 15 MG tablet; Take 1 tablet (15 mg total) by mouth daily.  Dispense: 30 tablet; Refill:  2  2. Advanced COPD (HCC) Continue prn use of duoneb and albuterol inhaler as prescribed.  - ipratropium-albuterol (DUONEB) 0.5-2.5 (3) MG/3ML SOLN; USE 1 VIAL IN NEBULIZER EVERY 4 HOURS  Dispense: 120 mL; Refill: 1 - VENTOLIN HFA 108 (90 Base) MCG/ACT inhaler; INHALE 2 PUFFS INTO THE LUNGS EVERY 6 HOURS AS NEEDED FOR WHEEZING OR SHORTNESS OF BREATH.  Dispense: 18 g; Refill: 3  3. Essential hypertension Continue bisoprolol and amlodipine as prescribed.  - bisoprolol (ZEBETA) 5 MG tablet; Take 1 tablet (5 mg total) by mouth daily.  Dispense: 90 tablet; Refill: 3 - amLODipine (NORVASC) 5 MG tablet; Take 1 tablet (5 mg total) by mouth daily.  Dispense: 90 tablet; Refill: 3  4. Hyponatremia Repeat CMP lab - CMP14+EGFR  5. Bilateral carotid artery stenosis Continue plavix as prescribed.  - clopidogrel (PLAVIX) 75 MG tablet; Take 1 tablet (75 mg total) by mouth daily.  Dispense: 90 tablet; Refill: 3  6. Mixed hyperlipidemia Repeat lipid panel - Lipid Profile  7. Generalized anxiety disorder Continue lorazepam and bupropion as prescribed. Follow up in 3 months for additional refills of lorazepam.  - LORazepam (ATIVAN) 0.5 MG tablet; TAKE 1/2 TO 1 TABLET BY MOUTH EVERY 8 HOURS AS NEEDED FOR ANXIETY OR SLEEP  Dispense: 60 tablet; Refill: 2 - buPROPion (WELLBUTRIN) 100 MG tablet; Take 1 tablet (100 mg total) by mouth daily.  Dispense: 90 tablet; Refill: 3      General Counseling: Lanika verbalizes understanding of the findings of todays visit and agrees with plan of treatment. I have discussed any further diagnostic evaluation that may be needed or ordered today. We also reviewed her medications today. she has been encouraged to call the office with any questions or concerns that should arise related to todays visit.    Orders Placed This Encounter  Procedures   CMP14+EGFR   Lipid Profile    Meds ordered this encounter  Medications   LORazepam (ATIVAN) 0.5 MG tablet    Sig: TAKE  1/2 TO 1 TABLET BY MOUTH EVERY 8 HOURS AS NEEDED FOR ANXIETY OR SLEEP    Dispense:  60 tablet    Refill:  2   ipratropium-albuterol (DUONEB) 0.5-2.5 (3) MG/3ML SOLN    Sig: USE 1 VIAL IN NEBULIZER EVERY 4 HOURS    Dispense:  120 mL  Refill:  1   clotrimazole (MYCELEX) 10 MG troche    Sig: Take 1 tablet (10 mg total) by mouth daily as needed (thrush).    Dispense:  70 Troche    Refill:  0   clopidogrel (PLAVIX) 75 MG tablet    Sig: Take 1 tablet (75 mg total) by mouth daily.    Dispense:  90 tablet    Refill:  3    Patient is due for a follow up appointment. Please contact the office at [phone]   buPROPion (WELLBUTRIN) 100 MG tablet    Sig: Take 1 tablet (100 mg total) by mouth daily.    Dispense:  90 tablet    Refill:  3    For future refills   bisoprolol (ZEBETA) 5 MG tablet    Sig: Take 1 tablet (5 mg total) by mouth daily.    Dispense:  90 tablet    Refill:  3    For future refills   amLODipine (NORVASC) 5 MG tablet    Sig: Take 1 tablet (5 mg total) by mouth daily.    Dispense:  90 tablet    Refill:  3   meloxicam (MOBIC) 15 MG tablet    Sig: Take 1 tablet (15 mg total) by mouth daily.    Dispense:  30 tablet    Refill:  2   VENTOLIN HFA 108 (90 Base) MCG/ACT inhaler    Sig: INHALE 2 PUFFS INTO THE LUNGS EVERY 6 HOURS AS NEEDED FOR WHEEZING OR SHORTNESS OF BREATH.    Dispense:  18 g    Refill:  3    Return in about 12 weeks (around 04/19/2023) for F/U, anxiety med refill, Serayah Yazdani PCP.   Total time spent:30 Minutes Time spent includes review of chart, medications, test results, and follow up plan with the patient.    Controlled Substance Database was reviewed by me.  This patient was seen by Sallyanne Kuster, FNP-C in collaboration with Dr. Beverely Risen as a part of collaborative care agreement.  Josejulian Tarango R. Tedd Sias, MSN, FNP-C Internal medicine

## 2023-01-27 ENCOUNTER — Telehealth: Payer: Self-pay | Admitting: Nurse Practitioner

## 2023-01-27 ENCOUNTER — Encounter: Payer: Self-pay | Admitting: Nurse Practitioner

## 2023-01-27 NOTE — Telephone Encounter (Signed)
Received vm on after-hour line from daughter, Cherell Marke stating patient now has covid and she is unsure what to do. Leisa stated she has covid as well. She is unable to take patient to ED or urgent care due to being so sick herself, did not want any family members to take her so they would not be exposed. I told her to call ems to take patient to ED since she was just released from hospital 3 weeks ago-Toni

## 2023-01-29 ENCOUNTER — Telehealth: Payer: Medicare Other | Admitting: Nurse Practitioner

## 2023-01-29 ENCOUNTER — Encounter: Payer: Self-pay | Admitting: Nurse Practitioner

## 2023-01-29 VITALS — BP 130/60 | Ht 60.0 in | Wt 72.0 lb

## 2023-01-29 DIAGNOSIS — U071 COVID-19: Secondary | ICD-10-CM | POA: Diagnosis not present

## 2023-01-29 DIAGNOSIS — J069 Acute upper respiratory infection, unspecified: Secondary | ICD-10-CM

## 2023-01-29 NOTE — Progress Notes (Signed)
[facility] [address] [place], [place] [address]  Internal MEDICINE  Telephone Visit  Patient Name: Christina Hester  [id number]  [id number]  Date of Service: 01/29/2023  I connected with the patient at 1055 by telephone and verified the patients identity using two identifiers.   I discussed the limitations, risks, security and privacy concerns of performing an evaluation and management service by telephone and the availability of in person appointments. I also discussed with the patient that there may be a patient responsible charge related to the service.  The patient expressed understanding and agrees to proceed.    Chief Complaint  Patient presents with   Telephone Screen   Telephone Assessment    COVID POSITIVE today  pt start symptoms Friday    Sinusitis   Cough   Fatigue    HPI Christina Hester presents for a telehealth virtual visit for covid positive URI. Onset of symptoms was 3 days ago Covid test was positive  Reports symptoms of cough, fatigue, headache, body aches.  Starting to feel a little bit better today. Has been on an antibiotic recently since being out of the hospital from parainfluenza virus.    Current Medication: Outpatient Encounter Medications as of 01/29/2023  Medication Sig   amLODipine (NORVASC) 5 MG tablet Take 1 tablet (5 mg total) by mouth daily.   azithromycin (ZITHROMAX) 250 MG tablet Take 1 tablet by mouth every Monday, Wednesday, and Friday.   baclofen (LIORESAL) 10 MG tablet Take 0.5 tablets (5 mg total) by mouth 3 (three) times daily as needed for muscle spasms.   bisoprolol (ZEBETA) 5 MG tablet Take 1 tablet (5 mg total) by mouth daily.   buPROPion (WELLBUTRIN) 100 MG tablet Take 1 tablet (100 mg total) by mouth daily.   clopidogrel (PLAVIX) 75 MG tablet Take 1 tablet (75 mg total) by mouth daily.   clotrimazole (MYCELEX) 10 MG troche Take 1 tablet (10 mg total) by mouth daily as needed (thrush).   ipratropium-albuterol (DUONEB)  0.5-2.5 (3) MG/3ML SOLN USE 1 VIAL IN NEBULIZER EVERY 4 HOURS   LORazepam (ATIVAN) 0.5 MG tablet TAKE 1/2 TO 1 TABLET BY MOUTH EVERY 8 HOURS AS NEEDED FOR ANXIETY OR SLEEP   meloxicam (MOBIC) 15 MG tablet Take 1 tablet (15 mg total) by mouth daily.   nitroGLYCERIN (NITROSTAT) 0.4 MG SL tablet Place 1 tablet (0.4 mg total) under the tongue every 5 (five) minutes as needed for chest pain.   nystatin (MYCOSTATIN) 100000 UNIT/ML suspension Take 5 mLs (500,000 Units total) by mouth 4 (four) times daily. (Patient taking differently: Take 5 mLs by mouth as needed (thrush).)   ondansetron (ZOFRAN) 4 MG tablet Take 1 tablet (4 mg total) by mouth every 8 (eight) hours as needed for nausea or vomiting.   pantoprazole (PROTONIX) 40 MG tablet Take 1 tablet (40 mg total) by mouth daily.   predniSONE (DELTASONE) 1 MG tablet Take 3 mg by mouth daily with breakfast.   VENTOLIN HFA 108 (90 Base) MCG/ACT inhaler INHALE 2 PUFFS INTO THE LUNGS EVERY 6 HOURS AS NEEDED FOR WHEEZING OR SHORTNESS OF BREATH.   No facility-administered encounter medications on file as of 01/29/2023.    Surgical History: Past Surgical History:  Procedure Laterality Date   ABDOMINAL HYSTERECTOMY     APPENDECTOMY     BREAST EXCISIONAL BIOPSY Left 80's or 90's   NEG   CARDIAC CATHETERIZATION     COLONOSCOPY N/A 05/22/2018   Procedure: COLONOSCOPY;  Surgeon: Toney Reil, MD;  Location: [facility] ENDOSCOPY;  Service: Gastroenterology;  Laterality: N/A;   COLONOSCOPY WITH PROPOFOL N/A 07/19/2020   Procedure: COLONOSCOPY WITH PROPOFOL;  Surgeon: Toledo, Boykin Nearing, MD;  Location: [facility];  Service: Gastroenterology;  Laterality: N/A;   ENDARTERECTOMY Right 11/06/2019   Procedure: ENDARTERECTOMY CAROTID;  Surgeon: Annice Needy, MD;  Location: [facility] ORS;  Service: Vascular;  Laterality: Right;   LEFT HEART CATH Bilateral 03/05/2017   Procedure: Left Heart Cath with poss PCI;  Surgeon: Iran Ouch, MD;  Location: [facility] INVASIVE CV LAB;   Service: Cardiovascular;  Laterality: Bilateral;    Medical History: Past Medical History:  Diagnosis Date   AAA (abdominal aortic aneurysm) (HCC)    a. 05/2016 Abd U/S: 3.02 x 3.02 AAA.   Anxiety    CAD (coronary artery disease)    a. 02/2017 NSTEMI/Cath: LM nl, LAD mild dzs, D2 min irregs, LCX mild dzs, OM2 50ost, RCA 95p, 16m, L-->R collats, EF 55-65%.   COPD (chronic obstructive pulmonary disease) (HCC)    Diastolic dysfunction    a. 02/2017 Echo: EF 55-60%, gr1 DD, ? inf HK, mild MR.   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 05/2016 ABI's: R 1.22, L 1.11.   Tobacco abuse     Family History: Family History  Problem Relation Age of Onset   Breast cancer Mother    Dementia Mother    Hypertension Mother    Breast cancer Maternal Aunt    Breast cancer Maternal Aunt    Breast cancer Maternal Aunt    CVA Father     Social History   Socioeconomic History   Marital status: Divorced    Spouse name: Not on file   Number of children: Not on file   Years of education: Not on file   Highest education level: Not on file  Occupational History   Not on file  Tobacco Use   Smoking status: Former    Current packs/day: 0.00    Types: Cigarettes    Quit date: 07/12/2017    Years since quitting: 5.5   Smokeless tobacco: Never   Tobacco comments:    currently 90 days without smoking  Vaping Use   Vaping status: Never Used  Substance and Sexual Activity   Alcohol use: No   Drug use: No   Sexual activity: Not on file  Other Topics Concern   Not on file  Social History Narrative   Not on file   Social Determinants of Health   Financial Resource Strain: Not on file  Food Insecurity: No Food Insecurity (11/26/2022)   Hunger Vital Sign    Worried About Running Out of Food in the Last Year: Never true    Ran Out of Food in the  Last Year: Never true  Transportation Needs: No Transportation Needs (11/26/2022)   PRAPARE - Administrator, Civil Service (Medical): No    Lack of Transportation (Non-Medical): No  Physical Activity: Not on file  Stress: Not on file  Social Connections: Not on file  Intimate Partner Violence: Not At Risk (11/26/2022)   Humiliation, Afraid, Rape, and Kick questionnaire    Fear of Current or Ex-Partner: No    Emotionally Abused: No    Physically Abused: No    Sexually Abused: No  Review of Systems  Constitutional:  Positive for fatigue.  HENT:  Positive for congestion and sinus pressure.   Respiratory:  Positive for cough and shortness of breath. Negative for chest tightness and wheezing.   Cardiovascular: Negative.  Negative for chest pain and palpitations.  Musculoskeletal:  Positive for myalgias (body aches).  Neurological:  Positive for headaches.    Vital Signs: BP 130/60   Ht 5' (1.524 m)   Wt 72 lb (32.7 kg)   BMI 14.06 kg/m    Observation/Objective: She is alert and oriented and engages in conversation appropriately. No acute distress noted.     Assessment/Plan: 1. Upper respiratory tract infection due to COVID-19 virus Finished antibiotic recently. Still on prednisone. Declined antiviral. Acknowledged when she needs to call the clinic and/or 911.    General Counseling: Marybeth verbalizes understanding of the findings of today's phone visit and agrees with plan of treatment. I have discussed any further diagnostic evaluation that may be needed or ordered today. We also reviewed her medications today. she has been encouraged to call the office with any questions or concerns that should arise related to todays visit.  Return if symptoms worsen or fail to improve, for discussed when to call 911.   No orders of the defined types were placed in this encounter.   No orders of the defined types were placed in this encounter.   Time spent:10  Minutes Time spent with patient included reviewing progress notes, labs, imaging studies, and discussing plan for follow up.  [place] Controlled Substance Database was reviewed by me for overdose risk score (ORS) if appropriate.  This patient was seen by Sallyanne Kuster, FNP-C in collaboration with Dr. Beverely Risen as a part of collaborative care agreement.  Dale Ribeiro R. Tedd Sias, MSN, FNP-C Internal medicine

## 2023-03-06 DIAGNOSIS — E782 Mixed hyperlipidemia: Secondary | ICD-10-CM | POA: Diagnosis not present

## 2023-03-06 DIAGNOSIS — E871 Hypo-osmolality and hyponatremia: Secondary | ICD-10-CM | POA: Diagnosis not present

## 2023-03-07 LAB — LIPID PANEL: HDL: 118 mg/dL (ref >39)

## 2023-03-07 LAB — CMP14+EGFR: Glucose: 94 mg/dL (ref 70–99)

## 2023-03-08 DIAGNOSIS — J449 Chronic obstructive pulmonary disease, unspecified: Secondary | ICD-10-CM | POA: Diagnosis not present

## 2023-04-16 ENCOUNTER — Ambulatory Visit: Payer: Medicare Other | Admitting: Nurse Practitioner

## 2023-04-16 VITALS — BP 135/80 | HR 81 | Temp 98.1°F | Resp 16 | Ht 60.0 in | Wt 72.2 lb

## 2023-04-16 DIAGNOSIS — M7918 Myalgia, other site: Secondary | ICD-10-CM

## 2023-04-16 DIAGNOSIS — J449 Chronic obstructive pulmonary disease, unspecified: Secondary | ICD-10-CM | POA: Diagnosis not present

## 2023-04-16 DIAGNOSIS — F411 Generalized anxiety disorder: Secondary | ICD-10-CM | POA: Diagnosis not present

## 2023-04-16 MED ORDER — METHOCARBAMOL 750 MG PO TABS: 750.0000 mg | ORAL_TABLET | Freq: Four times a day (QID) | ORAL | 2 refills | Status: DC

## 2023-04-16 MED ORDER — LORAZEPAM 0.5 MG PO TABS
ORAL_TABLET | ORAL | 2 refills | Status: DC
Start: 2023-04-16 — End: 2023-05-21

## 2023-04-16 NOTE — Progress Notes (Signed)
[facility] [address] [place], [place] [address]  Internal MEDICINE  Office Visit Note  Patient Name: Christina Hester  [id number]  [id number]  Date of Service: 04/16/2023  Chief Complaint  Patient presents with   Follow-up    HPI Christina Hester presents for a follow-up visit for COPD, anxiety, and abdominal pain Abdominal muscle pain after packing some boxes and doing a lot of bending which is not a movement she normally does very often Anxiety-- due for lorazepam refills COPD -- followed by pulm     Current Medication: Outpatient Encounter Medications as of 04/16/2023  Medication Sig   [DISCONTINUED] methocarbamol (ROBAXIN) 750 MG tablet Take 1 tablet (750 mg total) by mouth 4 (four) times daily.   amLODipine (NORVASC) 5 MG tablet Take 1 tablet (5 mg total) by mouth daily.   azithromycin (ZITHROMAX) 250 MG tablet Take 1 tablet by mouth every Monday, Wednesday, and Friday.   baclofen (LIORESAL) 10 MG tablet Take 0.5 tablets (5 mg total) by mouth 3 (three) times daily as needed for muscle spasms.   bisoprolol (ZEBETA) 5 MG tablet Take 1 tablet (5 mg total) by mouth daily.   clopidogrel (PLAVIX) 75 MG tablet Take 1 tablet (75 mg total) by mouth daily.   clotrimazole (MYCELEX) 10 MG troche Take 1 tablet (10 mg total) by mouth daily as needed (thrush).   ipratropium-albuterol (DUONEB) 0.5-2.5 (3) MG/3ML SOLN USE 1 VIAL IN NEBULIZER EVERY 4 HOURS   meloxicam (MOBIC) 15 MG tablet Take 1 tablet (15 mg total) by mouth daily.   nitroGLYCERIN (NITROSTAT) 0.4 MG SL tablet Place 1 tablet (0.4 mg total) under the tongue every 5 (five) minutes as needed for chest pain.   nystatin (MYCOSTATIN) 100000 UNIT/ML suspension Take 5 mLs (500,000 Units total) by mouth 4 (four) times daily. (Patient taking differently: Take 5 mLs by mouth as needed (thrush).)   ondansetron (ZOFRAN) 4 MG tablet Take 1 tablet (4 mg total) by mouth every 8 (eight) hours as needed for nausea or vomiting.    pantoprazole (PROTONIX) 40 MG tablet Take 1 tablet (40 mg total) by mouth daily.   predniSONE (DELTASONE) 1 MG tablet Take 3 mg by mouth daily with breakfast.   VENTOLIN HFA 108 (90 Base) MCG/ACT inhaler INHALE 2 PUFFS INTO THE LUNGS EVERY 6 HOURS AS NEEDED FOR WHEEZING OR SHORTNESS OF BREATH.   [DISCONTINUED] buPROPion (WELLBUTRIN) 100 MG tablet Take 1 tablet (100 mg total) by mouth daily.   [DISCONTINUED] LORazepam (ATIVAN) 0.5 MG tablet TAKE 1/2 TO 1 TABLET BY MOUTH EVERY 8 HOURS AS NEEDED FOR ANXIETY OR SLEEP   [DISCONTINUED] LORazepam (ATIVAN) 0.5 MG tablet TAKE 1/2 TO 1 TABLET BY MOUTH EVERY 8 HOURS AS NEEDED FOR ANXIETY OR SLEEP   No facility-administered encounter medications on file as of 04/16/2023.    Surgical History: Past Surgical History:  Procedure Laterality Date   ABDOMINAL HYSTERECTOMY     APPENDECTOMY     BREAST EXCISIONAL BIOPSY Left 80's or 90's   NEG   CARDIAC CATHETERIZATION     COLONOSCOPY N/A 05/22/2018   Procedure: COLONOSCOPY;  Surgeon: Toney Reil, MD;  Location: [facility];  Service: Gastroenterology;  Laterality: N/A;   COLONOSCOPY WITH PROPOFOL N/A 07/19/2020   Procedure: COLONOSCOPY WITH PROPOFOL;  Surgeon: Toledo, Boykin Nearing, MD;  Location: [facility];  Service: Gastroenterology;  Laterality: N/A;   ENDARTERECTOMY Right 11/06/2019   Procedure: ENDARTERECTOMY CAROTID;  Surgeon: Annice Needy, MD;  Location: [facility] ORS;  Service: Vascular;  Laterality: Right;   LEFT HEART CATH Bilateral 03/05/2017   Procedure: Left Heart Cath with poss PCI;  Surgeon: Iran Ouch, MD;  Location: [facility] INVASIVE CV LAB;  Service: Cardiovascular;  Laterality: Bilateral;    Medical History: Past Medical History:  Diagnosis Date   AAA (abdominal aortic aneurysm) (HCC)    a. 05/2016 Abd U/S: 3.02 x 3.02 AAA.   Anxiety    CAD (coronary artery disease)    a. 02/2017 NSTEMI/Cath: LM nl, LAD mild dzs, D2 min irregs, LCX mild dzs, OM2 50ost, RCA 95p, 180m, L-->R  collats, EF 55-65%.   COPD (chronic obstructive pulmonary disease) (HCC)    Diastolic dysfunction    a. 02/2017 Echo: EF 55-60%, gr1 DD, ? inf HK, mild MR.   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 05/2016 ABI's: R 1.22, L 1.11.   Tobacco abuse     Family History: Family History  Problem Relation Age of Onset   Breast cancer Mother    Dementia Mother    Hypertension Mother    Breast cancer Maternal Aunt    Breast cancer Maternal Aunt    Breast cancer Maternal Aunt    CVA Father     Social History   Socioeconomic History   Marital status: Divorced    Spouse name: Not on file   Number of children: Not on file   Years of education: Not on file   Highest education level: Not on file  Occupational History   Not on file  Tobacco Use   Smoking status: Former    Current packs/day: 0.00    Types: Cigarettes    Quit date: 07/12/2017    Years since quitting: 5.8   Smokeless tobacco: Never   Tobacco comments:    currently 90 days without smoking  Vaping Use   Vaping status: Never Used  Substance and Sexual Activity   Alcohol use: No   Drug use: No   Sexual activity: Not on file  Other Topics Concern   Not on file  Social History Narrative   Not on file   Social Determinants of Health   Financial Resource Strain: Low Risk  (03/08/2023)   Received from [facility]   Overall Financial Resource Strain (CARDIA)    Difficulty of Paying Living Expenses: Not hard at all  Food Insecurity: No Food Insecurity (03/08/2023)   Received from [facility]   Hunger Vital Sign    Worried About Running Out of Food in the Last Year: Never true    Ran Out of Food in the Last Year: Never true  Transportation Needs: No Transportation Needs (03/08/2023)   Received from [facility] -  Transportation    In the past 12 months, has lack of transportation kept you from medical appointments or from getting medications?: No    Lack of Transportation (Non-Medical): No  Physical Activity: Not on file  Stress: Not on file  Social Connections: Not on file  Intimate Partner Violence: Not At Risk (11/26/2022)   Humiliation, Afraid, Rape, and Kick questionnaire    Fear of Current or Ex-Partner: No    Emotionally Abused: No    Physically Abused: No    Sexually Abused: No      Review  of Systems  Constitutional:  Positive for fatigue.  HENT: Negative.    Respiratory:  Positive for cough and shortness of breath. Negative for chest tightness and wheezing.        Intermittent symptoms due to COPD  Cardiovascular: Negative.  Negative for chest pain and palpitations.  Gastrointestinal: Negative.  Negative for abdominal pain.  Musculoskeletal:  Positive for arthralgias, back pain and myalgias.  Neurological:  Negative for headaches.  Hematological:  Negative for adenopathy. Does not bruise/bleed easily.  Psychiatric/Behavioral:  Positive for sleep disturbance. Negative for self-injury and suicidal ideas. The patient is nervous/anxious.     Vital Signs: BP 135/80   Pulse 81   Temp 98.1 F (36.7 C)   Resp 16   Ht 5' (1.524 m)   Wt 72 lb 3.2 oz (32.7 kg)   SpO2 93%   BMI 14.10 kg/m    Physical Exam Vitals reviewed.  Constitutional:      General: She is not in acute distress.    Appearance: Normal appearance. She is not ill-appearing.  HENT:     Head: Normocephalic and atraumatic.  Eyes:     Pupils: Pupils are equal, round, and reactive to light.  Cardiovascular:     Rate and Rhythm: Normal rate and regular rhythm.  Pulmonary:     Effort: Pulmonary effort is normal. No respiratory distress.  Neurological:     Mental Status: She is alert and oriented to person, place, and time.  Psychiatric:        Mood and Affect: Mood normal.        Behavior: Behavior normal.         Assessment/Plan: 1. Advanced COPD (HCC) Continue medications as prescribed and following up with pulmonary   2. Abdominal muscle pain Prn methocarbamol ordered, take as prescribed.  3. Generalized anxiety disorder Continue lorazepam as prescribed. Follow up in 3 months for additional refills.    General Counseling: Christina Hester verbalizes understanding of the findings of todays visit and agrees with plan of treatment. I have discussed any further diagnostic evaluation that may be needed or ordered today. We also reviewed her medications today. she has been encouraged to call the office with any questions or concerns that should arise related to todays visit.    No orders of the defined types were placed in this encounter.   Meds ordered this encounter  Medications   DISCONTD: LORazepam (ATIVAN) 0.5 MG tablet    Sig: TAKE 1/2 TO 1 TABLET BY MOUTH EVERY 8 HOURS AS NEEDED FOR ANXIETY OR SLEEP    Dispense:  70 tablet    Refill:  2    Fill new script, note increased number of tablets.   DISCONTD: methocarbamol (ROBAXIN) 750 MG tablet    Sig: Take 1 tablet (750 mg total) by mouth 4 (four) times daily.    Dispense:  60 tablet    Refill:  2    Fill new script today    Return in about 12 weeks (around 07/09/2023) for F/U, anxiety med refill, Jayd Forrey PCP.   Total time spent:30 Minutes Time spent includes review of chart, medications, test results, and follow up plan with the patient.   [place] Controlled Substance Database was reviewed by me.  This patient was seen by Sallyanne Kuster, FNP-C in collaboration with Dr. Beverely Risen as a part of collaborative care agreement.   Marquist Binstock R. Tedd Sias, MSN, FNP-C Internal medicine

## 2023-04-24 ENCOUNTER — Encounter: Payer: Self-pay | Admitting: Nurse Practitioner

## 2023-04-24 ENCOUNTER — Ambulatory Visit (INDEPENDENT_AMBULATORY_CARE_PROVIDER_SITE_OTHER): Payer: Medicare Other | Admitting: Nurse Practitioner

## 2023-04-24 VITALS — BP 135/86 | HR 77 | Temp 97.9°F | Resp 16 | Ht 60.0 in | Wt 76.2 lb

## 2023-04-24 DIAGNOSIS — M549 Dorsalgia, unspecified: Secondary | ICD-10-CM

## 2023-04-24 DIAGNOSIS — I10 Essential (primary) hypertension: Secondary | ICD-10-CM

## 2023-04-24 DIAGNOSIS — E43 Unspecified severe protein-calorie malnutrition: Secondary | ICD-10-CM

## 2023-04-24 DIAGNOSIS — J449 Chronic obstructive pulmonary disease, unspecified: Secondary | ICD-10-CM | POA: Diagnosis not present

## 2023-04-24 NOTE — Progress Notes (Signed)
[facility] [address] [place], [place] [address]  Internal MEDICINE  Office Visit Note  Patient Name: Christina Hester  [id number]  [id number]  Date of Service: 04/24/2023  Chief Complaint  Patient presents with   Acute Visit    Chronic back pain      HPI [name] presents for an acute sick visit for back pain --onset is gradual, off and on over a couple of months.  --pulm doc said that her back pain is due to her breathing and severe COPD --muscle relaxants do not help pain.  --OTC medications do not help much.       Current Medication:  Outpatient Encounter Medications as of 04/24/2023  Medication Sig   amLODipine (NORVASC) 5 MG tablet Take 1 tablet (5 mg total) by mouth daily.   azithromycin (ZITHROMAX) 250 MG tablet Take 1 tablet by mouth every Monday, Wednesday, and Friday.   baclofen (LIORESAL) 10 MG tablet Take 0.5 tablets (5 mg total) by mouth 3 (three) times daily as needed for muscle spasms.   bisoprolol (ZEBETA) 5 MG tablet Take 1 tablet (5 mg total) by mouth daily.   clopidogrel (PLAVIX) 75 MG tablet Take 1 tablet (75 mg total) by mouth daily.   clotrimazole (MYCELEX) 10 MG troche Take 1 tablet (10 mg total) by mouth daily as needed (thrush).   Ensifentrine (OHTUVAYRE) 3 MG/2.5ML SUSP Inhale 3 mg into the lungs in the morning and at bedtime.   ipratropium-albuterol (DUONEB) 0.5-2.5 (3) MG/3ML SOLN USE 1 VIAL IN NEBULIZER EVERY 4 HOURS   meloxicam (MOBIC) 15 MG tablet Take 1 tablet (15 mg total) by mouth daily.   nitroGLYCERIN (NITROSTAT) 0.4 MG SL tablet Place 1 tablet (0.4 mg total) under the tongue every 5 (five) minutes as needed for chest pain.   nystatin (MYCOSTATIN) 100000 UNIT/ML suspension Take 5 mLs (500,000 Units total) by mouth 4 (four) times daily. (Patient taking differently: Take 5 mLs by mouth as needed (thrush).)   ondansetron (ZOFRAN) 4 MG tablet Take 1 tablet (4 mg total) by mouth every 8 (eight) hours as needed for nausea or  vomiting.   pantoprazole (PROTONIX) 40 MG tablet Take 1 tablet (40 mg total) by mouth daily.   predniSONE (DELTASONE) 1 MG tablet Take 3 mg by mouth daily with breakfast.   VENTOLIN HFA 108 (90 Base) MCG/ACT inhaler INHALE 2 PUFFS INTO THE LUNGS EVERY 6 HOURS AS NEEDED FOR WHEEZING OR SHORTNESS OF BREATH.   [DISCONTINUED] buPROPion (WELLBUTRIN) 100 MG tablet Take 1 tablet (100 mg total) by mouth daily.   [DISCONTINUED] LORazepam (ATIVAN) 0.5 MG tablet TAKE 1/2 TO 1 TABLET BY MOUTH EVERY 8 HOURS AS NEEDED FOR ANXIETY OR SLEEP   [DISCONTINUED] methocarbamol (ROBAXIN) 750 MG tablet Take 1 tablet (750 mg total) by mouth 4 (four) times daily.   No facility-administered encounter medications on file as of 04/24/2023.      Medical History: Past Medical History:  Diagnosis Date   AAA (abdominal aortic aneurysm) (HCC)    a. 05/2016 Abd U/S: 3.02 x 3.02 AAA.   Anxiety    CAD (coronary artery disease)    a. 02/2017 NSTEMI/Cath: LM nl, LAD mild dzs, D2 min irregs, LCX mild dzs, OM2 50ost, RCA 95p, 153m, L-->R collats, EF 55-65%.   COPD (chronic obstructive pulmonary disease) (HCC)    Diastolic dysfunction    a. 02/2017 Echo: EF 55-60%, gr1 DD, ? inf HK, mild MR.   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 05/2016 ABI's: R 1.22, L 1.11.   Tobacco abuse      Vital Signs: BP 135/86   Pulse 77   Temp 97.9 F (36.6 C)   Resp 16   Ht 5' (1.524 m)   Wt 76 lb 3.2 oz (34.6 kg)   SpO2 94%   BMI 14.88 kg/m    Review of Systems  Constitutional:  Positive for fatigue.  HENT: Negative.    Respiratory:  Positive for cough and shortness of breath. Negative for chest tightness and wheezing.        Intermittent symptoms due to COPD  Cardiovascular: Negative.  Negative for chest pain and palpitations.  Gastrointestinal: Negative.  Negative for abdominal  pain.  Musculoskeletal:  Positive for arthralgias, back pain and myalgias.  Neurological:  Negative for headaches.  Hematological:  Negative for adenopathy. Does not bruise/bleed easily.  Psychiatric/Behavioral:  Positive for sleep disturbance. Negative for self-injury and suicidal ideas. The patient is nervous/anxious.     Physical Exam Vitals reviewed.  Constitutional:      General: She is not in acute distress.    Appearance: Normal appearance. She is not ill-appearing.  HENT:     Head: Normocephalic and atraumatic.  Eyes:     Pupils: Pupils are equal, round, and reactive to light.  Cardiovascular:     Rate and Rhythm: Normal rate and regular rhythm.  Pulmonary:     Effort: Pulmonary effort is normal. No respiratory distress.  Neurological:     Mental Status: She is alert and oriented to person, place, and time.  Psychiatric:        Mood and Affect: Mood normal.        Behavior: Behavior normal.       Assessment/Plan: 1. Advanced COPD (HCC) Ohtuvayre prescribed, forms completed will be fax.   2. Mid back pain Suspected possible cause is her breathing and coughing related to COPD. Will try to get [facility] approved   3. Severe protein-calorie malnutrition (HCC) Continue supplemental protein shakes as discussed   4. Essential hypertension Stable, continue medications as prescribed.    General Counseling: Murle verbalizes understanding of the findings of todays visit and agrees with plan of treatment. I have discussed any further diagnostic evaluation that may be needed or ordered today. We also reviewed her medications today. she has been encouraged to call the office with any questions or concerns that should arise related to todays visit.    Counseling:    No orders of the defined types were placed in this encounter.   Meds ordered this encounter  Medications   Ensifentrine (OHTUVAYRE) 3 MG/2.5ML SUSP    Sig: Inhale 3 mg into the lungs in the morning and  at bedtime.    Dispense:  150 mL    Refill:  11    Dx code J44.9, J43.9, will fax ohtuvayre forms    Return in about 1 month (around 05/25/2023) for F/U, eval new med, Jancy Sprankle PCP.  [place] Controlled Substance Database was reviewed by me for overdose risk score (ORS)  Time spent:30 Minutes Time spent with patient included reviewing progress notes, labs, imaging studies, and discussing plan for follow up.   This patient was seen by Sallyanne Kuster, FNP-C in collaboration with Dr. Beverely Risen as a part of collaborative care agreement.  Kariya Lavergne R. Noreene Boreman,  MSN, FNP-C Internal Medicine

## 2023-05-08 DIAGNOSIS — R071 Chest pain on breathing: Secondary | ICD-10-CM | POA: Diagnosis not present

## 2023-05-10 ENCOUNTER — Other Ambulatory Visit: Payer: Self-pay | Admitting: Pulmonary Disease

## 2023-05-10 ENCOUNTER — Ambulatory Visit
Admission: RE | Admit: 2023-05-10 | Discharge: 2023-05-10 | Disposition: A | Discharge status: Home / Self Care | Payer: Medicare Other | Source: Ambulatory Visit | Attending: Pulmonary Disease | Admitting: Pulmonary Disease

## 2023-05-10 DIAGNOSIS — I714 Abdominal aortic aneurysm, without rupture, unspecified: Secondary | ICD-10-CM | POA: Diagnosis not present

## 2023-05-10 DIAGNOSIS — R071 Chest pain on breathing: Secondary | ICD-10-CM | POA: Diagnosis not present

## 2023-05-10 DIAGNOSIS — I2510 Atherosclerotic heart disease of native coronary artery without angina pectoris: Secondary | ICD-10-CM | POA: Diagnosis not present

## 2023-05-10 DIAGNOSIS — R079 Chest pain, unspecified: Secondary | ICD-10-CM | POA: Diagnosis not present

## 2023-05-10 DIAGNOSIS — N281 Cyst of kidney, acquired: Secondary | ICD-10-CM | POA: Diagnosis not present

## 2023-05-10 MED ORDER — IOHEXOL 350 MG/ML SOLN
50.0000 mL | Freq: Once | INTRAVENOUS | Status: AC | PRN
  Administered 2023-05-10: 50 mL via INTRAVENOUS

## 2023-05-21 ENCOUNTER — Ambulatory Visit (INDEPENDENT_AMBULATORY_CARE_PROVIDER_SITE_OTHER): Payer: Medicare Other | Admitting: Nurse Practitioner

## 2023-05-21 ENCOUNTER — Encounter: Payer: Self-pay | Admitting: Nurse Practitioner

## 2023-05-21 VITALS — BP 138/85 | HR 82 | Temp 98.2°F | Resp 16 | Ht 60.0 in | Wt 71.2 lb

## 2023-05-21 DIAGNOSIS — F411 Generalized anxiety disorder: Secondary | ICD-10-CM

## 2023-05-21 DIAGNOSIS — E43 Unspecified severe protein-calorie malnutrition: Secondary | ICD-10-CM

## 2023-05-21 DIAGNOSIS — J449 Chronic obstructive pulmonary disease, unspecified: Secondary | ICD-10-CM

## 2023-05-21 MED ORDER — LORAZEPAM 0.5 MG PO TABS
ORAL_TABLET | ORAL | 2 refills | Status: DC
Start: 2023-05-21 — End: 2023-07-09

## 2023-05-21 MED ORDER — MIRTAZAPINE 7.5 MG PO TABS
7.5000 mg | ORAL_TABLET | Freq: Every day | ORAL | 3 refills | Status: DC
Start: 2023-05-21 — End: 2023-07-09

## 2023-05-21 NOTE — Progress Notes (Unsigned)
[facility] [address] [place], [place] [address]  Internal MEDICINE  Office Visit Note  Patient Name: Christina Hester  [id number]  [id number]  Date of Service: 05/21/2023  Chief Complaint  Patient presents with  . Hyperlipidemia  . Hypertension  . Follow-up    Discuss anxiety.     HPI Christina Hester presents for a follow-up visit for weight loss, COPD and anxiety.  Unexplained weight loss -- lost 5 more lbs, not always eating well, drinks 1 protein drink per day. Has been on bupropion which can cause weight loss, need to change this medication to something else.  COPD -- has not heard about new medication ohtuvayre order yet. Was told that her back pain is from her lungs  Anxiety -- takes lorazepam     Current Medication: Outpatient Encounter Medications as of 05/21/2023  Medication Sig  . amLODipine (NORVASC) 5 MG tablet Take 1 tablet (5 mg total) by mouth daily.  Marland Kitchen azithromycin (ZITHROMAX) 250 MG tablet Take 1 tablet by mouth every Monday, Wednesday, and Friday.  . baclofen (LIORESAL) 10 MG tablet Take 0.5 tablets (5 mg total) by mouth 3 (three) times daily as needed for muscle spasms.  . bisoprolol (ZEBETA) 5 MG tablet Take 1 tablet (5 mg total) by mouth daily.  . clopidogrel (PLAVIX) 75 MG tablet Take 1 tablet (75 mg total) by mouth daily.  . clotrimazole (MYCELEX) 10 MG troche Take 1 tablet (10 mg total) by mouth daily as needed (thrush).  Marland Kitchen ipratropium-albuterol (DUONEB) 0.5-2.5 (3) MG/3ML SOLN USE 1 VIAL IN NEBULIZER EVERY 4 HOURS  . meloxicam (MOBIC) 15 MG tablet Take 1 tablet (15 mg total) by mouth daily.  . mirtazapine (REMERON) 7.5 MG tablet Take 1 tablet (7.5 mg total) by mouth at bedtime.  . nitroGLYCERIN (NITROSTAT) 0.4 MG SL tablet Place 1 tablet (0.4 mg total) under the tongue every 5 (five) minutes as needed for chest pain.  Marland Kitchen nystatin (MYCOSTATIN) 100000 UNIT/ML suspension Take 5 mLs (500,000 Units total) by mouth 4 (four) times daily. (Patient taking  differently: Take 5 mLs by mouth as needed (thrush).)  . ondansetron (ZOFRAN) 4 MG tablet Take 1 tablet (4 mg total) by mouth every 8 (eight) hours as needed for nausea or vomiting.  . pantoprazole (PROTONIX) 40 MG tablet Take 1 tablet (40 mg total) by mouth daily.  . predniSONE (DELTASONE) 1 MG tablet Take 3 mg by mouth daily with breakfast.  . VENTOLIN HFA 108 (90 Base) MCG/ACT inhaler INHALE 2 PUFFS INTO THE LUNGS EVERY 6 HOURS AS NEEDED FOR WHEEZING OR SHORTNESS OF BREATH.  . [DISCONTINUED] buPROPion (WELLBUTRIN) 100 MG tablet Take 1 tablet (100 mg total) by mouth daily.  . [DISCONTINUED] LORazepam (ATIVAN) 0.5 MG tablet TAKE 1/2 TO 1 TABLET BY MOUTH EVERY 8 HOURS AS NEEDED FOR ANXIETY OR SLEEP  . LORazepam (ATIVAN) 0.5 MG tablet TAKE 1/2 TO 1 TABLET BY MOUTH EVERY 8 HOURS AS NEEDED FOR ANXIETY OR SLEEP   No facility-administered encounter medications on file as of 05/21/2023.    Surgical History: Past Surgical History:  Procedure Laterality Date  . ABDOMINAL HYSTERECTOMY    . APPENDECTOMY    . BREAST EXCISIONAL BIOPSY Left 80's or 90's   NEG  . CARDIAC CATHETERIZATION    . COLONOSCOPY N/A 05/22/2018   Procedure: COLONOSCOPY;  Surgeon: Toney Reil, MD;  Location: [facility] ENDOSCOPY;  Service: Gastroenterology;  Laterality: N/A;  . COLONOSCOPY WITH PROPOFOL N/A 07/19/2020   Procedure: COLONOSCOPY WITH PROPOFOL;  Surgeon:  Toledo, Boykin Nearing, MD;  Location: [facility] ENDOSCOPY;  Service: Gastroenterology;  Laterality: N/A;  . ENDARTERECTOMY Right 11/06/2019   Procedure: ENDARTERECTOMY CAROTID;  Surgeon: Annice Needy, MD;  Location: [facility] ORS;  Service: Vascular;  Laterality: Right;  . LEFT HEART CATH Bilateral 03/05/2017   Procedure: Left Heart Cath with poss PCI;  Surgeon: Iran Ouch, MD;  Location: [facility] INVASIVE CV LAB;  Service: Cardiovascular;  Laterality: Bilateral;    Medical History: Past Medical History:  Diagnosis Date  . AAA (abdominal aortic aneurysm) (HCC)    a. 05/2016  Abd U/S: 3.02 x 3.02 AAA.  Marland Kitchen Anxiety   . CAD (coronary artery disease)    a. 02/2017 NSTEMI/Cath: LM nl, LAD mild dzs, D2 min irregs, LCX mild dzs, OM2 50ost, RCA 95p, 146m, L-->R collats, EF 55-65%.  Marland Kitchen COPD (chronic obstructive pulmonary disease) (HCC)   . Diastolic dysfunction    a. 02/2017 Echo: EF 55-60%, gr1 DD, ? inf HK, mild MR.  Marland Kitchen Hx of basal cell carcinoma    back   . 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 Hyperlipidemia   . Hypertension   . PAD (peripheral artery disease) (HCC)    a. 05/2016 ABI's: R 1.22, L 1.11.  . Tobacco abuse     Family History: Family History  Problem Relation Age of Onset  . Breast cancer Mother   . Dementia Mother   . Hypertension Mother   . Breast cancer Maternal Aunt   . Breast cancer Maternal Aunt   . Breast cancer Maternal Aunt   . CVA Father     Social History   Socioeconomic History  . Marital status: Divorced    Spouse name: Not on file  . Number of children: Not on file  . Years of education: Not on file  . Highest education level: Not on file  Occupational History  . Not on file  Tobacco Use  . Smoking status: Former    Current packs/day: 0.00    Types: Cigarettes    Quit date: 07/12/2017    Years since quitting: 5.8  . Smokeless tobacco: Never  . Tobacco comments:    currently 90 days without smoking  Vaping Use  . Vaping status: Never Used  Substance and Sexual Activity  . Alcohol use: No  . Drug use: No  . Sexual activity: Not on file  Other Topics Concern  . Not on file  Social History Narrative  . Not on file   Social Determinants of Health   Financial Resource Strain: Low Risk  (03/08/2023)   Received from [facility]   Overall Financial Resource Strain (CARDIA)   . Difficulty of Paying Living Expenses: Not hard at all  Food Insecurity: No Food Insecurity (03/08/2023)   Received from [facility]   Hunger Vital Sign   . Worried About  Programme researcher, broadcasting/film/video in the Last Year: Never true   . Ran Out of Food in the Last Year: Never true  Transportation Needs: No Transportation Needs (03/08/2023)   Received from [facility]   [facility] - Transportation   . In the past 12 months, has lack of transportation kept you from medical appointments or from getting medications?: No   . Lack of Transportation (Non-Medical): No  Physical Activity: Not on file  Stress: Not on file  Social Connections: Not on file  Intimate Partner Violence: Not At Risk (11/26/2022)   Humiliation,  Afraid, Rape, and Kick questionnaire   . Fear of Current or Ex-Partner: No   . Emotionally Abused: No   . Physically Abused: No   . Sexually Abused: No      Review of Systems  Constitutional:  Positive for fatigue.  HENT: Negative.    Respiratory:  Positive for cough and shortness of breath. Negative for chest tightness and wheezing.        Intermittent symptoms due to COPD  Cardiovascular: Negative.  Negative for chest pain and palpitations.  Gastrointestinal: Negative.  Negative for abdominal pain.  Neurological:  Negative for headaches.  Hematological:  Negative for adenopathy. Does not bruise/bleed easily.    Vital Signs: BP (!) 150/78   Pulse 82   Temp 98.2 F (36.8 C)   Resp 16   Ht 5' (1.524 m)   Wt 71 lb 3.2 oz (32.3 kg)   SpO2 95%   BMI 13.91 kg/m    Physical Exam Vitals reviewed.  Constitutional:      General: She is not in acute distress.    Appearance: Normal appearance. She is not ill-appearing.  HENT:     Head: Normocephalic and atraumatic.  Eyes:     Pupils: Pupils are equal, round, and reactive to light.  Cardiovascular:     Rate and Rhythm: Normal rate and regular rhythm.  Pulmonary:     Effort: Pulmonary effort is normal. No respiratory distress.  Neurological:     Mental Status: She is alert and oriented to person, place, and time.  Psychiatric:        Mood and Affect: Mood normal.        Behavior:  Behavior normal.       Assessment/Plan: 1. Protein-calorie malnutrition, severe *** - mirtazapine (REMERON) 7.5 MG tablet; Take 1 tablet (7.5 mg total) by mouth at bedtime.  Dispense: 30 tablet; Refill: 3  2. Generalized anxiety disorder  - LORazepam (ATIVAN) 0.5 MG tablet; TAKE 1/2 TO 1 TABLET BY MOUTH EVERY 8 HOURS AS NEEDED FOR ANXIETY OR SLEEP  Dispense: 70 tablet; Refill: 2 - mirtazapine (REMERON) 7.5 MG tablet; Take 1 tablet (7.5 mg total) by mouth at bedtime.  Dispense: 30 tablet; Refill: 3   General Counseling: Christina Hester verbalizes understanding of the findings of todays visit and agrees with plan of treatment. I have discussed any further diagnostic evaluation that may be needed or ordered today. We also reviewed her medications today. she has been encouraged to call the office with any questions or concerns that should arise related to todays visit.    No orders of the defined types were placed in this encounter.   Meds ordered this encounter  Medications  . LORazepam (ATIVAN) 0.5 MG tablet    Sig: TAKE 1/2 TO 1 TABLET BY MOUTH EVERY 8 HOURS AS NEEDED FOR ANXIETY OR SLEEP    Dispense:  70 tablet    Refill:  2    Fill new script, note increased number of tablets.  . mirtazapine (REMERON) 7.5 MG tablet    Sig: Take 1 tablet (7.5 mg total) by mouth at bedtime.    Dispense:  30 tablet    Refill:  3    Discontinue bupropion and fill new script asap today    Return in about 4 weeks (around 06/18/2023) for F/U, Kacey Vicuna PCP, eval new med.   Total time spent:30 Minutes Time spent includes review of chart, medications, test results, and follow up plan with the patient.   [place] Controlled Substance Database was  reviewed by me.  This patient was seen by Sallyanne Kuster, FNP-C in collaboration with Dr. Beverely Risen as a part of collaborative care agreement.   Akashdeep Chuba R. Tedd Sias, MSN, FNP-C Internal medicine

## 2023-05-22 ENCOUNTER — Telehealth: Payer: Self-pay

## 2023-05-28 ENCOUNTER — Encounter: Payer: Self-pay | Admitting: Nurse Practitioner

## 2023-05-31 DIAGNOSIS — J449 Chronic obstructive pulmonary disease, unspecified: Secondary | ICD-10-CM | POA: Diagnosis not present

## 2023-05-31 DIAGNOSIS — Z23 Encounter for immunization: Secondary | ICD-10-CM | POA: Diagnosis not present

## 2023-06-05 ENCOUNTER — Telehealth: Payer: Self-pay

## 2023-06-05 NOTE — Telephone Encounter (Signed)
Faxed ohtuvayre with note

## 2023-06-09 ENCOUNTER — Encounter: Payer: Self-pay | Admitting: Nurse Practitioner

## 2023-06-09 MED ORDER — OHTUVAYRE 3 MG/2.5ML IN SUSP: 3.0000 mg | Freq: Two times a day (BID) | RESPIRATORY_TRACT | 11 refills | Status: DC

## 2023-06-18 ENCOUNTER — Ambulatory Visit: Payer: Medicare Other | Admitting: Nurse Practitioner

## 2023-06-20 ENCOUNTER — Telehealth: Payer: Self-pay

## 2023-06-20 NOTE — Telephone Encounter (Signed)
[facility] called to let Christina Hester know they have received the palliative care referral and are going to work this referral up. Any question to call [phone]

## 2023-06-26 NOTE — Telephone Encounter (Signed)
Pt had appt will alyssa discuss at visit

## 2023-07-09 ENCOUNTER — Ambulatory Visit (INDEPENDENT_AMBULATORY_CARE_PROVIDER_SITE_OTHER): Payer: Medicare Other | Admitting: Nurse Practitioner

## 2023-07-09 ENCOUNTER — Other Ambulatory Visit: Payer: Self-pay | Admitting: Nurse Practitioner

## 2023-07-09 ENCOUNTER — Encounter: Payer: Self-pay | Admitting: Nurse Practitioner

## 2023-07-09 VITALS — BP 138/82 | Temp 98.2°F | Resp 16 | Ht 60.0 in | Wt 71.4 lb

## 2023-07-09 DIAGNOSIS — J449 Chronic obstructive pulmonary disease, unspecified: Secondary | ICD-10-CM

## 2023-07-09 DIAGNOSIS — F411 Generalized anxiety disorder: Secondary | ICD-10-CM

## 2023-07-09 DIAGNOSIS — D692 Other nonthrombocytopenic purpura: Secondary | ICD-10-CM | POA: Insufficient documentation

## 2023-07-09 DIAGNOSIS — E43 Unspecified severe protein-calorie malnutrition: Secondary | ICD-10-CM | POA: Diagnosis not present

## 2023-07-09 MED ORDER — BUPROPION HCL 100 MG PO TABS: 100.0000 mg | ORAL_TABLET | Freq: Every day | ORAL | 3 refills | Status: DC

## 2023-07-09 MED ORDER — LORAZEPAM 0.5 MG PO TABS
ORAL_TABLET | ORAL | 1 refills | Status: DC
Start: 2023-08-16 — End: 2023-10-04

## 2023-07-09 NOTE — Progress Notes (Cosign Needed)
[facility] [address] [place], [place] [address]  Internal MEDICINE  Office Visit Note  Patient Name: Christina Hester  [id number]  [id number]  Date of Service: 07/09/2023  Chief Complaint  Patient presents with   Hyperlipidemia   Hypertension   Follow-up    HPI Kaylii presents for a follow-up visit for COPD, anxiety, and malnutrition. COPD -- pulmonologist got ohtuvayre approved, no cost to the patient.  Anxiety -- takes lorazepam as needed.  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    Current Medication: Outpatient Encounter Medications as of 07/09/2023  Medication Sig   amLODipine (NORVASC) 5 MG tablet Take 1 tablet (5 mg total) by mouth daily.   azithromycin (ZITHROMAX) 250 MG tablet Take 1 tablet by mouth every Monday, Wednesday, and Friday.   baclofen (LIORESAL) 10 MG tablet Take 0.5 tablets (5 mg total) by mouth 3 (three) times daily as needed for muscle spasms.   bisoprolol (ZEBETA) 5 MG tablet Take 1 tablet (5 mg total) by mouth daily.   clopidogrel (PLAVIX) 75 MG tablet Take 1 tablet (75 mg total) by mouth daily.   clotrimazole (MYCELEX) 10 MG troche Take 1 tablet (10 mg total) by mouth daily as needed (thrush).   Ensifentrine (OHTUVAYRE) 3 MG/2.5ML SUSP Inhale 3 mg into the lungs in the morning and at bedtime.   ipratropium-albuterol (DUONEB) 0.5-2.5 (3) MG/3ML SOLN USE 1 VIAL IN NEBULIZER EVERY 4 HOURS   meloxicam (MOBIC) 15 MG tablet Take 1 tablet (15 mg total) by mouth daily.   nitroGLYCERIN (NITROSTAT) 0.4 MG SL tablet Place 1 tablet (0.4 mg total) under the tongue every 5 (five) minutes as needed for chest pain.   nystatin (MYCOSTATIN) 100000 UNIT/ML suspension Take 5 mLs (500,000 Units total) by mouth 4 (four) times daily. (Patient taking differently: Take 5 mLs by mouth as needed (thrush).)   ondansetron (ZOFRAN) 4 MG tablet Take 1 tablet (4 mg total) by mouth every 8 (eight)  hours as needed for nausea or vomiting.   pantoprazole (PROTONIX) 40 MG tablet Take 1 tablet (40 mg total) by mouth daily.   predniSONE (DELTASONE) 1 MG tablet Take 3 mg by mouth daily with breakfast.   VENTOLIN HFA 108 (90 Base) MCG/ACT inhaler INHALE 2 PUFFS INTO THE LUNGS EVERY 6 HOURS AS NEEDED FOR WHEEZING OR SHORTNESS OF BREATH.   [DISCONTINUED] LORazepam (ATIVAN) 0.5 MG tablet TAKE 1/2 TO 1 TABLET BY MOUTH EVERY 8 HOURS AS NEEDED FOR ANXIETY OR SLEEP   [DISCONTINUED] mirtazapine (REMERON) 7.5 MG tablet Take 1 tablet (7.5 mg total) by mouth at bedtime.   buPROPion (WELLBUTRIN) 100 MG tablet Take 1 tablet (100 mg total) by mouth daily.   [START ON 08/16/2023] LORazepam (ATIVAN) 0.5 MG tablet TAKE 1/2 TO 1 TABLET BY MOUTH EVERY 8 HOURS AS NEEDED FOR ANXIETY OR SLEEP   No facility-administered encounter medications on file as of 07/09/2023.    Surgical History: Past Surgical History:  Procedure Laterality Date   ABDOMINAL HYSTERECTOMY     APPENDECTOMY     BREAST EXCISIONAL BIOPSY Left 80's or 90's   NEG   CARDIAC CATHETERIZATION     COLONOSCOPY N/A 05/22/2018   Procedure: COLONOSCOPY;  Surgeon: Toney Reil, MD;  Location: [facility] ENDOSCOPY;  Service: Gastroenterology;  Laterality: N/A;   COLONOSCOPY WITH PROPOFOL N/A 07/19/2020   Procedure: COLONOSCOPY WITH PROPOFOL;  Surgeon: Toledo, Boykin Nearing, MD;  Location: [facility] ENDOSCOPY;  Service: Gastroenterology;  Laterality:  N/A;   ENDARTERECTOMY Right 11/06/2019   Procedure: ENDARTERECTOMY CAROTID;  Surgeon: Annice Needy, MD;  Location: [facility] ORS;  Service: Vascular;  Laterality: Right;   LEFT HEART CATH Bilateral 03/05/2017   Procedure: Left Heart Cath with poss PCI;  Surgeon: Iran Ouch, MD;  Location: [facility] INVASIVE CV LAB;  Service: Cardiovascular;  Laterality: Bilateral;    Medical History: Past Medical History:  Diagnosis Date   AAA (abdominal aortic aneurysm) (HCC)    a. 05/2016 Abd U/S: 3.02 x 3.02 AAA.   Anxiety    CAD  (coronary artery disease)    a. 02/2017 NSTEMI/Cath: LM nl, LAD mild dzs, D2 min irregs, LCX mild dzs, OM2 50ost, RCA 95p, 137m, L-->R collats, EF 55-65%.   COPD (chronic obstructive pulmonary disease) (HCC)    Diastolic dysfunction    a. 02/2017 Echo: EF 55-60%, gr1 DD, ? inf HK, mild MR.   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 05/2016 ABI's: R 1.22, L 1.11.   Tobacco abuse     Family History: Family History  Problem Relation Age of Onset   Breast cancer Mother    Dementia Mother    Hypertension Mother    Breast cancer Maternal Aunt    Breast cancer Maternal Aunt    Breast cancer Maternal Aunt    CVA Father     Social History   Socioeconomic History   Marital status: Divorced    Spouse name: Not on file   Number of children: Not on file   Years of education: Not on file   Highest education level: Not on file  Occupational History   Not on file  Tobacco Use   Smoking status: Former    Current packs/day: 0.00    Types: Cigarettes    Quit date: 07/12/2017    Years since quitting: 5.9   Smokeless tobacco: Never   Tobacco comments:    currently 90 days without smoking  Vaping Use   Vaping status: Never Used  Substance and Sexual Activity   Alcohol use: No   Drug use: No   Sexual activity: Not on file  Other Topics Concern   Not on file  Social History Narrative   Not on file   Social Drivers of Health   Financial Resource Strain: Low Risk  (03/08/2023)   Received from [facility]   Overall Financial Resource Strain (CARDIA)    Difficulty of Paying Living Expenses: Not hard at all  Food Insecurity: No Food Insecurity (03/08/2023)   Received from [facility]   Hunger Vital Sign    Worried About Running Out of Food in the Last Year: Never true    Ran Out of Food in the Last Year:  Never true  Transportation Needs: No Transportation Needs (03/08/2023)   Received from [facility] - Transportation    In the past 12 months, has lack of transportation kept you from medical appointments or from getting medications?: No    Lack of Transportation (Non-Medical): No  Physical Activity: Not on file  Stress: Not on file  Social Connections: Not on file  Intimate Partner Violence: Not At Risk (11/26/2022)   Humiliation, Afraid, Rape, and Kick questionnaire    Fear of Current or Ex-Partner:  No    Emotionally Abused: No    Physically Abused: No    Sexually Abused: No      Review of Systems  Constitutional:  Positive for fatigue.  HENT: Negative.    Respiratory:  Positive for cough and shortness of breath. Negative for chest tightness and wheezing.        Intermittent symptoms due to COPD  Cardiovascular: Negative.  Negative for chest pain and palpitations.  Gastrointestinal: Negative.  Negative for abdominal pain.  Musculoskeletal:  Positive for arthralgias, back pain and myalgias.  Neurological:  Negative for headaches.  Hematological:  Negative for adenopathy. Does not bruise/bleed easily.  Psychiatric/Behavioral:  Positive for sleep disturbance. Negative for self-injury and suicidal ideas. The patient is nervous/anxious.     Vital Signs: BP 138/82 Comment: 152/80  Temp 98.2 F (36.8 C)   Resp 16   Ht 5' (1.524 m)   Wt 71 lb 6.4 oz (32.4 kg)   BMI 13.94 kg/m    Physical Exam Vitals reviewed.  Constitutional:      General: She is not in acute distress.    Appearance: Normal appearance. She is not ill-appearing.  HENT:     Head: Normocephalic and atraumatic.  Eyes:     Pupils: Pupils are equal, round, and reactive to light.  Cardiovascular:     Rate and Rhythm: Normal rate and regular rhythm.  Pulmonary:     Effort: Pulmonary effort is normal. No respiratory distress.  Neurological:     Mental Status: She is alert and  oriented to person, place, and time.  Psychiatric:        Mood and Affect: Mood normal.        Behavior: Behavior normal.        Assessment/Plan: 1. Advanced COPD (HCC) (Primary) Breathing is improving with ohtuvayre twice daily which has been approved through her pulmonologist  2. 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 Generalized anxiety disorder Continue bupropion and lorazepam as prescribed. Discontinue mirtazapine.  - buPROPion (WELLBUTRIN) 100 MG tablet; Take 1 tablet (100 mg total) by mouth daily.  Dispense: 90 tablet; Refill: 3 - LORazepam (ATIVAN) 0.5 MG tablet; TAKE 1/2 TO 1 TABLET BY MOUTH EVERY 8 HOURS AS NEEDED FOR ANXIETY OR SLEEP  Dispense: 70 tablet; Refill: 1   General Counseling: Eriyonna verbalizes understanding of the findings of todays visit and agrees with plan of treatment. I have discussed any further diagnostic evaluation that may be needed or ordered today. We also reviewed her medications today. she has been encouraged to call the office with any questions or concerns that should arise related to todays visit.    No orders of the defined types were placed in this encounter.   Meds ordered this encounter  Medications   buPROPion (WELLBUTRIN) 100 MG tablet    Sig: Take 1 tablet (100 mg total) by mouth daily.    Dispense:  90 tablet    Refill:  3    Discontinue mirtazapine and fill new script for bupropion asap please   LORazepam (ATIVAN) 0.5 MG tablet    Sig: TAKE 1/2 TO 1 TABLET BY MOUTH EVERY 8 HOURS AS NEEDED FOR ANXIETY OR SLEEP    Dispense:  70 tablet    Refill:  1    Please keep on file for future refills for late January and 1 in late February.  Return in about 3 months (around 09/26/2023) for F/U, anxiety med refill, Reggie Bise PCP lorazepam.   Total time spent:30 Minutes Time spent includes review of  chart, medications, test results, and follow up plan with the patient.    Controlled Substance Database was reviewed by me.  This patient was seen by Sallyanne Kuster, FNP-C in collaboration with Dr. Beverely Risen as a part of collaborative care agreement.   Keiko Myricks R. Tedd Sias, MSN, FNP-C Internal medicine

## 2023-07-13 ENCOUNTER — Ambulatory Visit: Payer: Medicare Other | Admitting: Cardiovascular Disease

## 2023-07-13 NOTE — Progress Notes (Deleted)
Cardiology Office Note   Date:  07/13/2023   ID:  Christina Hester, DOB 19-Sep-1940, MRN [id number]  PCP:  Sallyanne Kuster, NP  Cardiologist:   Lorine Bears, MD   No chief complaint on file.     History of Present Illness: Christina Hester is a 82 y.o. female who presents for a follow-up visit regarding coronary artery disease.   She has known history of COPD , prolonged tobacco use, moderate bilateral carotid disease and abdominal aortic aneurysm followed by [facility]..   Cardiac catheterization was done in August 2018 which showed severe one-vessel coronary artery disease with chronically occluded right coronary artery with well-developed left-to-right collaterals. There was mild to moderate nonobstructive disease affecting the left coronary arteries with extremely tortuous vessels. Ejection fraction was normal with mildly elevated left ventricular end-diastolic pressure. She is intolerant to aspirin due to ringing in her ears.  She has been on Plavix.  She refuses to take statins due to fear of side effects and stopped Zetia due to leg pain.    She had multiple hospitalizations for COPD exasperation and finally quit smoking few months ago.  She uses oxygen at home.  She has occasional heartburn but no exertional chest pain.  She has chronic exertional dyspnea.  She is concerned about blue discoloration in the left foot.  No claudication.  Past Medical History:  Diagnosis Date   AAA (abdominal aortic aneurysm) (HCC)    a. 05/2016 Abd U/S: 3.02 x 3.02 AAA.   Anxiety    CAD (coronary artery disease)    a. 02/2017 NSTEMI/Cath: LM nl, LAD mild dzs, D2 min irregs, LCX mild dzs, OM2 50ost, RCA 95p, 113m, L-->R collats, EF 55-65%.   COPD (chronic obstructive pulmonary disease) (HCC)    Diastolic dysfunction    a. 02/2017 Echo: EF 55-60%, gr1 DD, ? inf HK, mild MR.   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 05/2016 ABI's: R 1.22, L 1.11.   Tobacco abuse     Past Surgical History:  Procedure Laterality Date   ABDOMINAL HYSTERECTOMY     APPENDECTOMY     BREAST EXCISIONAL BIOPSY Left 80's or 90's   NEG   CARDIAC CATHETERIZATION     COLONOSCOPY N/A 05/22/2018   Procedure: COLONOSCOPY;  Surgeon: Toney Reil, MD;  Location: [facility] ENDOSCOPY;  Service: Gastroenterology;  Laterality: N/A;   COLONOSCOPY WITH PROPOFOL N/A 07/19/2020   Procedure: COLONOSCOPY WITH PROPOFOL;  Surgeon: Toledo, Boykin Nearing, MD;  Location: [facility] ENDOSCOPY;  Service: Gastroenterology;  Laterality: N/A;   ENDARTERECTOMY Right 11/06/2019   Procedure: ENDARTERECTOMY CAROTID;  Surgeon: Annice Needy, MD;  Location: [facility] ORS;  Service: Vascular;  Laterality: Right;   LEFT HEART CATH Bilateral 03/05/2017   Procedure: Left Heart Cath with poss PCI;  Surgeon: Iran Ouch, MD;  Location: [facility] INVASIVE CV LAB;  Service: Cardiovascular;  Laterality: Bilateral;     Current Outpatient Medications  Medication Sig Dispense Refill   amLODipine (NORVASC) 5 MG tablet Take 1 tablet (5 mg total) by mouth daily. 90 tablet 3   azithromycin (ZITHROMAX) 250 MG tablet Take 1 tablet by mouth every Monday, Wednesday, and Friday.     baclofen (LIORESAL) 10 MG tablet Take 0.5 tablets (5 mg total) by mouth 3 (three) times daily  as needed for muscle spasms. 90 tablet 1   bisoprolol (ZEBETA) 5 MG tablet Take 1 tablet (5 mg total) by mouth daily. 90 tablet 3   buPROPion (WELLBUTRIN) 100 MG tablet Take 1 tablet (100 mg total) by mouth daily. 90 tablet 3   clopidogrel (PLAVIX) 75 MG tablet Take 1 tablet (75 mg total) by mouth daily. 90 tablet 3   clotrimazole (MYCELEX) 10 MG troche Take 1 tablet (10 mg total) by mouth daily as needed (thrush). 70 Troche 0   Ensifentrine (OHTUVAYRE) 3 MG/2.5ML SUSP Inhale 3 mg into the lungs in the morning and at bedtime. 150 mL 11   ipratropium-albuterol  (DUONEB) 0.5-2.5 (3) MG/3ML SOLN USE 1 VIAL IN NEBULIZER EVERY 4 HOURS 120 mL 11   [START ON 08/16/2023] LORazepam (ATIVAN) 0.5 MG tablet TAKE 1/2 TO 1 TABLET BY MOUTH EVERY 8 HOURS AS NEEDED FOR ANXIETY OR SLEEP 70 tablet 1   meloxicam (MOBIC) 15 MG tablet Take 1 tablet (15 mg total) by mouth daily. 30 tablet 2   nitroGLYCERIN (NITROSTAT) 0.4 MG SL tablet Place 1 tablet (0.4 mg total) under the tongue every 5 (five) minutes as needed for chest pain. 20 tablet 12   nystatin (MYCOSTATIN) 100000 UNIT/ML suspension Take 5 mLs (500,000 Units total) by mouth 4 (four) times daily. (Patient taking differently: Take 5 mLs by mouth as needed (thrush).) 200 mL 0   ondansetron (ZOFRAN) 4 MG tablet Take 1 tablet (4 mg total) by mouth every 8 (eight) hours as needed for nausea or vomiting. 20 tablet 0   pantoprazole (PROTONIX) 40 MG tablet Take 1 tablet (40 mg total) by mouth daily. 90 tablet 3   predniSONE (DELTASONE) 1 MG tablet Take 3 mg by mouth daily with breakfast.     VENTOLIN HFA 108 (90 Base) MCG/ACT inhaler INHALE 2 PUFFS INTO THE LUNGS EVERY 6 HOURS AS NEEDED FOR WHEEZING OR SHORTNESS OF BREATH. 18 g 3   No current facility-administered medications for this visit.    Allergies:   Hydrocodone-acetaminophen, Aspirin, Budesonide-formoterol fumarate, Prednisone, Sulfa antibiotics, and Tramadol    Social History:  The patient  reports that she quit smoking about 6 years ago. Her smoking use included cigarettes. She has never used smokeless tobacco. She reports that she does not drink alcohol and does not use drugs.   Family History:  The patient's family history includes Breast cancer in her maternal aunt, maternal aunt, maternal aunt, and mother; CVA in her father; Dementia in her mother; Hypertension in her mother.    ROS:  Please see the history of present illness.   Otherwise, review of systems are positive for none.   All other systems are reviewed and negative.    PHYSICAL EXAM: VS:  There  were no vitals taken for this visit. , BMI There is no height or weight on file to calculate BMI. GEN: Well nourished, well developed, in no acute distress  HEENT: normal  Neck: no JVD, carotid bruits, or masses Cardiac: RRR; no murmurs, rubs, or gallops,no edema  Respiratory:  clear to auscultation bilaterally, normal work of breathing GI: soft, nontender, nondistended, + BS MS: no deformity or atrophy  Skin: warm and dry, no rash Neuro:  Strength and sensation are intact Psych: euthymic mood, full affect Distal pulses in the left foot are normal with some cyanosis and prominent veins.  EKG:  EKG is ordered today. The ekg ordered today demonstrates's normal sinus rhythm with no significant ST or T wave changes.  Recent  Labs: 11/25/2022: B Natriuretic Peptide 85.7 11/27/2022: Hemoglobin 12.3; Platelets 187 11/28/2022: Magnesium 2.0 03/06/2023: ALT 19; BUN 15; Creatinine, Ser 0.99; Potassium 3.9; Sodium 137    Lipid Panel    Component Value Date/Time   CHOL 289 (H) 03/06/2023 0804   TRIG 75 03/06/2023 0804   HDL 118 03/06/2023 0804   CHOLHDL 2.4 03/06/2023 0804   CHOLHDL 2.6 11/26/2015 0412   VLDL 6 11/26/2015 0412   LDLCALC 159 (H) 03/06/2023 0804      Wt Readings from Last 3 Encounters:  07/09/23 71 lb 6.4 oz (32.4 kg)  05/21/23 71 lb 3.2 oz (32.3 kg)  04/24/23 76 lb 3.2 oz (34.6 kg)          No data to display            ASSESSMENT AND PLAN:  1.  Coronary artery disease involving native coronary arteries with other forms of angina: Symptoms are well controlled with metoprolol and amlodipine.  Continue same medications.  Continue Plavix long-term given that she is allergic to aspirin.  2. Hyperlipidemia: She continues to refuse statins due to fear of side effects and she did not tolerate [place].  She refuses anticholesterol medications.  3. Tobacco use: I congratulated her on smoking cessation.  4. Essential hypertension: Blood pressure well-controlled.  5.   Discoloration of the left foot: Likely due to chronic venous insufficiency.  She has palpable DP and PT pulses.    Disposition:   FU with me in 6 months  Signed,  Lorine Bears, MD  07/13/2023 7:34 AM    [facility]

## 2023-07-27 ENCOUNTER — Ambulatory Visit: Payer: Medicare Other | Admitting: Cardiology

## 2023-08-15 ENCOUNTER — Encounter: Payer: Self-pay | Admitting: Cardiology

## 2023-08-15 ENCOUNTER — Ambulatory Visit: Payer: Medicare Other | Attending: Cardiology | Admitting: Cardiology

## 2023-08-15 VITALS — BP 134/66 | HR 72 | Ht 60.0 in | Wt 73.2 lb

## 2023-08-15 DIAGNOSIS — I7143 Infrarenal abdominal aortic aneurysm, without rupture: Secondary | ICD-10-CM | POA: Diagnosis not present

## 2023-08-15 DIAGNOSIS — J449 Chronic obstructive pulmonary disease, unspecified: Secondary | ICD-10-CM | POA: Diagnosis not present

## 2023-08-15 DIAGNOSIS — I2510 Atherosclerotic heart disease of native coronary artery without angina pectoris: Secondary | ICD-10-CM | POA: Insufficient documentation

## 2023-08-15 DIAGNOSIS — Z720 Tobacco use: Secondary | ICD-10-CM | POA: Insufficient documentation

## 2023-08-15 DIAGNOSIS — E782 Mixed hyperlipidemia: Secondary | ICD-10-CM | POA: Insufficient documentation

## 2023-08-15 DIAGNOSIS — I10 Essential (primary) hypertension: Secondary | ICD-10-CM | POA: Insufficient documentation

## 2023-08-15 NOTE — Patient Instructions (Signed)
Medication Instructions:  No changes *If you need a refill on your cardiac medications before your next appointment, please call your pharmacy*  Lab Work: No labs  Testing/Procedures: No testing  Follow-Up: At [facility], you and your health needs are our priority.  As part of our continuing mission to provide you with exceptional heart care, we have created designated Provider Care Teams.  These Care Teams include your primary Cardiologist (physician) and Advanced Practice Providers (APPs -  Physician Assistants and Nurse Practitioners) who all work together to provide you with the care you need, when you need it.  We recommend signing up for the patient portal called "MyChart".  Sign up information is provided on this After Visit Summary.  MyChart is used to connect with patients for Virtual Visits (Telemedicine).  Patients are able to view lab/test results, encounter notes, upcoming appointments, etc.  Non-urgent messages can be sent to your provider as well.   To learn more about what you can do with MyChart, go to [email or web address].    Your next appointment:   6 month(s)  Provider:   Lorine Bears, MD or Charlsie Quest, NP  Other Instructions

## 2023-08-15 NOTE — Progress Notes (Signed)
Cardiology Office Note:  .   Date:  08/15/2023  ID:  Cordella Register, DOB Feb 06, 1941, MRN [id number] PCP: Sallyanne Kuster, NP  [facility] Providers Cardiologist:  Lorine Bears, MD    History of Present Illness: .   Christina Hester is a 83 y.o. female with a past medical history of coronary artery disease, COPD, prolonged tobacco use, moderate bilateral carotid disease, and abdominal aortic aneurysm followed by [facility], hypertension, HFpEF, peripheral vascular disease, hyperlipidemia, who is here today to follow up on her coronary artery disease.   Previous NSTEMI in 02/2017 with left main normal, LAD with mild disease, D2 with moderate irregularities, left circumflex with mild disease, OM 250% ostial stenosis, RCA 95% proximal stenosis 100% mid stenosis with left-to-right collaterals. Recommended medical therapy.  Echocardiogram completed in 02/2017 revealed an LVEF of 55 to 60%, possible mild hypokinesis of the inferior myocardium, G1 DD, mild mitral regurgitation.  She underwent Lexiscan Myoview in 11/2021 which was considered a low risk study.  Event monitor in 11/2021 showed predominant underlying rhythm was sinus with an average heart rate of 72 bpm with 14 supraventricular tachycardia runs and rare PACs and PVCs.   She was last seen in clinic 01/05/2023 by Dr. Kirke Corin.  At that time she did recently multiple hospitalizations for COPD exacerbation and finally quit smoking a few months ago.  She been on chronic oxygen at home.  Had occasional heartburn but no exertional chest pain.  She had stable chronic exertional dyspnea.  She also was concerned about the blue discolored station to her left foot but without claudication symptoms and she had palpable pulses.  She was continued on her current medication regimen without changes or further testing that was ordered.  She returns to clinic today stating that overall she has been doing well.  She continues to follow with pulmonary and was recently  started on new medication which she states makes all the changes in the world.  She denies any chest pain, palpitations, or chest discomfort.  Continues to suffer from chronic shortness of breath.  New medication has changed the degree of shortness of breath that she experiences.  She continues to use nocturnal oxygen at 2 L of O2 via nasal cannula.  She does still continue to complain of this colorization to her feet and continues to follow with vascular.  States that she has been compliant with her current medication regimen without any unwanted side effects.  She denies any recent hospitalizations or visits to the emergency department.  ROS: 10 point review of systems has been reviewed and considered negative with exception was been listed in HPI  Studies Reviewed: Marland Kitchen   EKG Interpretation Date/Time:  Wednesday August 15 2023 13:28:44 EST Ventricular Rate:  72 PR Interval:  142 QRS Duration:  64 QT Interval:  358 QTC Calculation: 392 R Axis:   85  Text Interpretation: Normal sinus rhythm with sinus arrhythmia Normal ECG When compared with ECG of 09-Jan-2023 15:32, No significant change was found Confirmed by Charlsie Quest ([id number]) on 08/15/2023 1:36:46 PM    Event Monitor ([employer]) 12/13/2021 Patient had a min HR of 55 bpm, max HR of 179 bpm, and avg HR of 72 bpm. Predominant underlying rhythm was Sinus Rhythm.  14 Supraventricular Tachycardia runs occurred, the run with the fastest interval lasting 5 beats with a max rate of 179 bpm, the longest lasting 14 beats with an avg rate of 100 bpm. Rare PACs and rare PVCs.  Lexiscan Myoview 11/15/2021  Normal pharmacologic myocardial perfusion stress test without evidence of significant ischemia or scar.   Left ventricular systolic function is normal.   Coronary artery and aortic valvular calcifications are noted, as well as aortic atherosclerosis.   Emphysematous changes are noted in the visualized lungs on the attenuation correction CT.   This is a  low risk study.   2D echo 03/05/2017 Study Conclusions  - Left ventricle: The cavity size was normal. There was mild    concentric hypertrophy. Systolic function was normal. The    estimated ejection fraction was in the range of 55% to 60%.    Possible mild hypokinesis of the inferior myocardium. Doppler    parameters are consistent with abnormal left ventricular    relaxation (grade 1 diastolic dysfunction).  - Mitral valve: There was mild regurgitation.   LHC 03/05/2017 The left ventricular systolic function is normal. LV end diastolic pressure is mildly elevated. The left ventricular ejection fraction is 55-65% by visual estimate. Prox RCA lesion, 95 %stenosed. Mid RCA lesion, 100 %stenosed. Ost 2nd Mrg to 2nd Mrg lesion, 50 %stenosed.   1. Severe one-vessel coronary artery disease with chronically occluded right coronary artery with well-developed left-to-right collaterals. Mild to moderate nonobstructive disease affecting the left system with extremely tortuous coronary arteries. 2. Normal LV systolic function and mildly elevated left ventricular end-diastolic pressure.   Recommendations: Medical therapy.   Risk Assessment/Calculations:             Physical Exam:   VS:  BP 134/66   Pulse 72   Ht 5' (1.524 m)   Wt 73 lb 3.2 oz (33.2 kg)   SpO2 96%   BMI 14.30 kg/m    Wt Readings from Last 3 Encounters:  08/15/23 73 lb 3.2 oz (33.2 kg)  07/09/23 71 lb 6.4 oz (32.4 kg)  05/21/23 71 lb 3.2 oz (32.3 kg)    GEN: Well nourished, well developed in no acute distress NECK: No JVD; No carotid bruits CARDIAC: RRR, no murmurs, rubs, gallops RESPIRATORY:  Clear to auscultation without rales, wheezing or rhonchi  ABDOMEN: Soft, non-tender, non-distended EXTREMITIES:  No edema; No deformity discoloration noted to both of her bilateral lower extremities and feet, 2+ posttibial tibialis and dorsalis pedis pulses to bilateral lower extremities  ASSESSMENT AND PLAN: .   Coronary  artery disease of native coronary arteries without angina.  Symptoms are well-controlled on amlodipine 5 mg daily.  She is continued on clopidogrel 75 mg daily long-term given her allergy to aspirin.  EKG today reveals sinus rhythm with sinus arrhythmia with rate of 72 with no significant changes noted.  No further ischemic testing needed.  Mixed hyperlipidemia which she continues to refuse statins due to fear of side effects as she was unable to tolerate ezetimibe.  LDL 159 with the goal of at least 70.  Primary hypertension with blood pressure 134/66.  Blood pressure has been well-controlled on amlodipine 5 mg daily and bisoprolol 5 mg daily.  Patient has been encouraged to continue to monitor blood pressure 1 to 2 hours postmedication administration as well.  Tobacco abuse which she had formally quit unfortunately she has not been sneaking and smoking again she was advised total cessation is recommended.  AAA was last measured at 3.6 cm from 08/2022.  She has upcoming ultrasound for reevaluation of her AAA this coming February.  This continues to be followed by [facility].  He also has upcoming carotid duplex and ABIs for her peripheral vascular disease that continues to be  followed by vascular.  COPD that requires nocturnal oxygen not in acute exacerbation.  She continues to be followed by pulmonary.  Huge improvement in her respiratory status since recent medication changes made by pulmonary.       Dispo: Patient return to clinic to see MD/APP in 6 months or sooner if needed for reevaluation of symptoms.  Signed, Emersyn Wyss, NP

## 2023-08-31 ENCOUNTER — Telehealth: Payer: Self-pay

## 2023-08-31 NOTE — Telephone Encounter (Signed)
Faxed [facility] for duoneb

## 2023-09-11 ENCOUNTER — Ambulatory Visit (INDEPENDENT_AMBULATORY_CARE_PROVIDER_SITE_OTHER): Payer: Medicare Other | Admitting: Vascular Surgery

## 2023-09-11 ENCOUNTER — Telehealth: Payer: Self-pay

## 2023-09-11 ENCOUNTER — Ambulatory Visit (INDEPENDENT_AMBULATORY_CARE_PROVIDER_SITE_OTHER): Payer: Medicare Other

## 2023-09-11 ENCOUNTER — Encounter (INDEPENDENT_AMBULATORY_CARE_PROVIDER_SITE_OTHER): Payer: Self-pay | Admitting: Vascular Surgery

## 2023-09-11 VITALS — BP 157/82 | HR 77 | Resp 18 | Ht 60.0 in | Wt 72.8 lb

## 2023-09-11 DIAGNOSIS — I2583 Coronary atherosclerosis due to lipid rich plaque: Secondary | ICD-10-CM | POA: Diagnosis not present

## 2023-09-11 DIAGNOSIS — I739 Peripheral vascular disease, unspecified: Secondary | ICD-10-CM

## 2023-09-11 DIAGNOSIS — I6523 Occlusion and stenosis of bilateral carotid arteries: Secondary | ICD-10-CM

## 2023-09-11 DIAGNOSIS — I2510 Atherosclerotic heart disease of native coronary artery without angina pectoris: Secondary | ICD-10-CM

## 2023-09-11 DIAGNOSIS — I7143 Infrarenal abdominal aortic aneurysm, without rupture: Secondary | ICD-10-CM

## 2023-09-11 DIAGNOSIS — I10 Essential (primary) hypertension: Secondary | ICD-10-CM | POA: Diagnosis not present

## 2023-09-11 NOTE — Assessment & Plan Note (Signed)
 Her aneurysm is slightly increased in size and now measures 3.8 cm in maximal diameter.  We will continue to follow this annually until it reaches 4 cm in maximal diameter.  Given her extremely small size, if she has continued growth we may consider fixing the smaller at about 4.5 cm in diameter rather than 5.  At current, blood pressure control and avoidance of tobacco would be the best options of avoiding aneurysmal growth.

## 2023-09-11 NOTE — Telephone Encounter (Signed)
 Faxed [facility] pres for Duoneb

## 2023-09-11 NOTE — Assessment & Plan Note (Signed)
 She has had some drop in her ABIs down to 0.79 on the right and 0.82 on the left but continues to have multiphasic waveforms distally.  She does not have any rest pain or ulceration.  She is limited more by her respiratory status for walking than any leg pain.  No plan for intervention at this time.  Continue Plavix.  Recheck in 1 year.

## 2023-09-11 NOTE — Progress Notes (Signed)
 MRN : [id number]  Christina Hester is a 83 y.o. (03/22/1941) female who presents with chief complaint of  Chief Complaint  Patient presents with   Follow-up    40yr carotid.abi,aaa.  .  History of Present Illness: Patient returns today in follow up of multiple vascular issues.  The patient is doing reasonably well today.  She has her biggest complaint currently of worsening COPD and is now on oxygen at night.  She started a new medicine and this has resulted in improvement in her breathing in general.  She denies any current aneurysm related symptoms. Specifically, the patient denies new back or abdominal pain, or signs of peripheral embolization.  Her aneurysm is slightly increased in size and now measures 3.8 cm in maximal diameter. She is also followed for carotid disease.  She is status post right carotid endarterectomy in the past.  No focal neurologic symptoms. Specifically, the patient denies amaurosis fugax, speech or swallowing difficulties, or arm or leg weakness or numbness. Duplex today shows the right carotid endarterectomy to be patent with stable 40 to 59% left ICA stenosis.  We are also checking her ABIs today.  She does not describe lifestyle limiting claudication, ischemic rest pain, or ulceration.  She has had some drop in her ABIs down to 0.79 on the right and 0.82 on the left but continues to have multiphasic waveforms distally.  Current Outpatient Medications  Medication Sig Dispense Refill   amLODipine (NORVASC) 5 MG tablet Take 1 tablet (5 mg total) by mouth daily. 90 tablet 3   azithromycin (ZITHROMAX) 250 MG tablet Take 1 tablet by mouth every Monday, Wednesday, and Friday.     bisoprolol (ZEBETA) 5 MG tablet Take 1 tablet (5 mg total) by mouth daily. 90 tablet 3   buPROPion (WELLBUTRIN) 100 MG tablet Take 1 tablet (100 mg total) by mouth daily. 90 tablet 3   clopidogrel (PLAVIX) 75 MG tablet Take 1 tablet (75 mg total) by mouth daily. 90 tablet 3   clotrimazole  (MYCELEX) 10 MG troche Take 1 tablet (10 mg total) by mouth daily as needed (thrush). 70 Troche 0   Ensifentrine (OHTUVAYRE) 3 MG/2.5ML SUSP Inhale 3 mg into the lungs in the morning and at bedtime. 150 mL 11   ipratropium-albuterol (DUONEB) 0.5-2.5 (3) MG/3ML SOLN USE 1 VIAL IN NEBULIZER EVERY 4 HOURS 120 mL 11   LORazepam (ATIVAN) 0.5 MG tablet TAKE 1/2 TO 1 TABLET BY MOUTH EVERY 8 HOURS AS NEEDED FOR ANXIETY OR SLEEP 70 tablet 1   meloxicam (MOBIC) 15 MG tablet Take 1 tablet (15 mg total) by mouth daily. 30 tablet 2   nitroGLYCERIN (NITROSTAT) 0.4 MG SL tablet Place 1 tablet (0.4 mg total) under the tongue every 5 (five) minutes as needed for chest pain. 20 tablet 12   nystatin (MYCOSTATIN) 100000 UNIT/ML suspension Take 5 mLs (500,000 Units total) by mouth 4 (four) times daily. (Patient taking differently: Take 5 mLs by mouth as needed (thrush).) 200 mL 0   ondansetron (ZOFRAN) 4 MG tablet Take 1 tablet (4 mg total) by mouth every 8 (eight) hours as needed for nausea or vomiting. 20 tablet 0   pantoprazole (PROTONIX) 40 MG tablet Take 1 tablet (40 mg total) by mouth daily. 90 tablet 3   predniSONE (DELTASONE) 1 MG tablet Take 3 mg by mouth daily with breakfast.     VENTOLIN HFA 108 (90 Base) MCG/ACT inhaler INHALE 2 PUFFS INTO THE LUNGS EVERY 6 HOURS AS NEEDED FOR WHEEZING  OR SHORTNESS OF BREATH. 18 g 3   No current facility-administered medications for this visit.    Past Medical History:  Diagnosis Date   AAA (abdominal aortic aneurysm) (HCC)    a. 05/2016 Abd U/S: 3.02 x 3.02 AAA.   Anxiety    CAD (coronary artery disease)    a. 02/2017 NSTEMI/Cath: LM nl, LAD mild dzs, D2 min irregs, LCX mild dzs, OM2 50ost, RCA 95p, 13m, L-->R collats, EF 55-65%.   COPD (chronic obstructive pulmonary disease) (HCC)    Diastolic dysfunction    a. 02/2017 Echo: EF 55-60%, gr1 DD, ? inf HK, mild MR.   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 05/2016 ABI's: R 1.22, L 1.11.   Tobacco abuse     Past Surgical History:  Procedure Laterality Date   ABDOMINAL HYSTERECTOMY     APPENDECTOMY     BREAST EXCISIONAL BIOPSY Left 80's or 90's   NEG   CARDIAC CATHETERIZATION     COLONOSCOPY N/A 05/22/2018   Procedure: COLONOSCOPY;  Surgeon: Toney Reil, MD;  Location: [facility];  Service: Gastroenterology;  Laterality: N/A;   COLONOSCOPY WITH PROPOFOL N/A 07/19/2020   Procedure: COLONOSCOPY WITH PROPOFOL;  Surgeon: Toledo, Boykin Nearing, MD;  Location: [facility];  Service: Gastroenterology;  Laterality: N/A;   ENDARTERECTOMY Right 11/06/2019   Procedure: ENDARTERECTOMY CAROTID;  Surgeon: Annice Needy, MD;  Location: [facility];  Service: Vascular;  Laterality: Right;   LEFT HEART CATH Bilateral 03/05/2017   Procedure: Left Heart Cath with poss PCI;  Surgeon: Iran Ouch, MD;  Location: [facility];  Service: Cardiovascular;  Laterality: Bilateral;     Social History   Tobacco Use   Smoking status: Former    Current packs/day: 0.00    Types: Cigarettes    Quit date: 07/12/2017    Years since quitting: 6.1   Smokeless tobacco: Never   Tobacco comments:    currently 90 days without smoking  Vaping Use   Vaping status: Never Used  Substance Use Topics   Alcohol use: No   Drug use: No      Family History  Problem Relation Age of Onset   Breast cancer Mother    Dementia Mother    Hypertension Mother    Breast cancer Maternal Aunt    Breast cancer Maternal Aunt    Breast cancer Maternal Aunt    CVA Father      Allergies  Allergen Reactions   Hydrocodone-Acetaminophen Other (See Comments)    Facial numbness   Aspirin Tinitus   Budesonide-Formoterol Fumarate Other (See Comments)    Patient felt "out of her mind" and angry. Patient felt "out of her mind" and angry.   Prednisone Other (See Comments)     Makes violent. Patient tolerates low doses   Sulfa Antibiotics Nausea And Vomiting   Tramadol Other (See Comments)     REVIEW OF SYSTEMS (Negative unless checked)   Constitutional: [] Weight loss  [] Fever  [] Chills Cardiac: [] Chest pain   [] Chest pressure   [] Palpitations   [] Shortness of breath when laying flat   [] Shortness of breath at rest   [] Shortness of breath with exertion. Vascular:  [] Pain in legs with walking   [] Pain in legs at rest   [] Pain in legs when  laying flat   [] Claudication   [] Pain in feet when walking  [] Pain in feet at rest  [] Pain in feet when laying flat   [] History of DVT   [] Phlebitis   [] Swelling in legs   [] Varicose veins   [] Non-healing ulcers Pulmonary:   [x] Uses home oxygen   [] Productive cough   [] Hemoptysis   [] Wheeze  [x] COPD   [] Asthma Neurologic:  [] Dizziness  [] Blackouts   [] Seizures   [] History of stroke   [] History of TIA  [] Aphasia   [] Temporary blindness   [] Dysphagia   [] Weakness or numbness in arms   [] Weakness or numbness in legs Musculoskeletal:  [x] Arthritis   [] Joint swelling   [x] Joint pain   [x] Low back pain Hematologic:  [] Easy bruising  [] Easy bleeding   [] Hypercoagulable state   [] Anemic   Gastrointestinal:  [] Blood in stool   [] Vomiting blood  [] Gastroesophageal reflux/heartburn   [] Abdominal pain Genitourinary:  [] Chronic kidney disease   [] Difficult urination  [] Frequent urination  [] Burning with urination   [] Hematuria Skin:  [] Rashes   [] Ulcers   [] Wounds Psychological:  [x] History of anxiety   []  History of major depression.  Physical Examination  BP (!) 157/82   Pulse 77   Resp 18   Ht 5' (1.524 m)   Wt 72 lb 12.8 oz (33 kg)   BMI 14.22 kg/m  Gen:   NAD, thin Head: [place]/AT, + temporalis wasting. Ear/Nose/Throat: Hearing grossly intact, nares w/o erythema or drainage Eyes: Conjunctiva clear. Sclera non-icteric Neck: Supple.  Trachea midline Pulmonary:  Good air movement, no use of accessory muscles.  Breathing comfortably on  room air at rest. Cardiac: RRR, no JVD Vascular:  Vessel Right Left  Radial Palpable Palpable                          PT 1+ palpable 1+ palpable  DP 1+ palpable 1+ palpable   Gastrointestinal: soft, non-tender/non-distended. No guarding/reflex.  Musculoskeletal: M/S 5/5 throughout.  No deformity or atrophy.  Prominent superficial varicosities are present particular in the left foot and ankle area.  No significant lower extremity edema. Neurologic: Sensation grossly intact in extremities.  Symmetrical.  Speech is fluent.  Psychiatric: Judgment intact, Mood & affect appropriate for pt's clinical situation. Dermatologic: No rashes or ulcers noted.  No cellulitis or open wounds.      Labs No results found for this or any previous visit (from the past 2160 hours).  Radiology No results found.  Assessment/Plan  AAA (abdominal aortic aneurysm) without rupture (HCC) Her aneurysm is slightly increased in size and now measures 3.8 cm in maximal diameter.  We will continue to follow this annually until it reaches 4 cm in maximal diameter.  Given her extremely small size, if she has continued growth we may consider fixing the smaller at about 4.5 cm in diameter rather than 5.  At current, blood pressure control and avoidance of tobacco would be the best options of avoiding aneurysmal growth.  Carotid stenosis Duplex today shows the right carotid endarterectomy to be patent with stable 40 to 59% left ICA stenosis.  No role for intervention.  Continue Plavix.  Recheck in 1 year.  PVD (peripheral vascular disease) (HCC) She has had some drop in her ABIs down to 0.79 on the right and 0.82 on the left but continues to have multiphasic waveforms distally.  She does not have any rest pain or ulceration.  She is limited more by her respiratory status for walking  than any leg pain.  No plan for intervention at this time.  Continue Plavix.  Recheck in 1 year.  Benign hypertension blood pressure  control important in reducing the progression of atherosclerotic disease. On appropriate oral medications.   Coronary artery disease Has been seen by cardiology.  No issues with surgery earlier this year.  Festus Barren, MD  09/11/2023 1:21 PM    This note was created with Dragon medical transcription system.  Any errors from dictation are purely unintentional

## 2023-09-11 NOTE — Assessment & Plan Note (Signed)
 Duplex today shows the right carotid endarterectomy to be patent with stable 40 to 59% left ICA stenosis.  No role for intervention.  Continue Plavix.  Recheck in 1 year.

## 2023-09-14 LAB — VAS US ABI WITH/WO TBI
Left ABI: 0.82
Right ABI: 0.79

## 2023-09-20 DIAGNOSIS — J449 Chronic obstructive pulmonary disease, unspecified: Secondary | ICD-10-CM | POA: Diagnosis not present

## 2023-09-27 DIAGNOSIS — G4701 Insomnia due to medical condition: Secondary | ICD-10-CM | POA: Diagnosis not present

## 2023-09-27 DIAGNOSIS — J449 Chronic obstructive pulmonary disease, unspecified: Secondary | ICD-10-CM | POA: Diagnosis not present

## 2023-09-27 DIAGNOSIS — F17200 Nicotine dependence, unspecified, uncomplicated: Secondary | ICD-10-CM | POA: Diagnosis not present

## 2023-09-27 DIAGNOSIS — R600 Localized edema: Secondary | ICD-10-CM | POA: Diagnosis not present

## 2023-10-04 ENCOUNTER — Ambulatory Visit (INDEPENDENT_AMBULATORY_CARE_PROVIDER_SITE_OTHER): Payer: Medicare Other | Admitting: Nurse Practitioner

## 2023-10-04 ENCOUNTER — Encounter: Payer: Self-pay | Admitting: Nurse Practitioner

## 2023-10-04 VITALS — BP 135/70 | HR 83 | Temp 98.3°F | Resp 16 | Ht 60.0 in | Wt 75.2 lb

## 2023-10-04 DIAGNOSIS — K219 Gastro-esophageal reflux disease without esophagitis: Secondary | ICD-10-CM | POA: Diagnosis not present

## 2023-10-04 DIAGNOSIS — J449 Chronic obstructive pulmonary disease, unspecified: Secondary | ICD-10-CM | POA: Diagnosis not present

## 2023-10-04 DIAGNOSIS — F411 Generalized anxiety disorder: Secondary | ICD-10-CM | POA: Diagnosis not present

## 2023-10-04 DIAGNOSIS — E43 Unspecified severe protein-calorie malnutrition: Secondary | ICD-10-CM | POA: Diagnosis not present

## 2023-10-04 MED ORDER — LORAZEPAM 0.5 MG PO TABS
ORAL_TABLET | ORAL | 2 refills | Status: DC
Start: 2023-10-04 — End: 2023-12-26

## 2023-10-04 MED ORDER — VENTOLIN HFA 108 (90 BASE) MCG/ACT IN AERS: INHALATION_SPRAY | RESPIRATORY_TRACT | 3 refills | Status: DC

## 2023-10-04 MED ORDER — PANTOPRAZOLE SODIUM 40 MG PO TBEC
40.0000 mg | DELAYED_RELEASE_TABLET | Freq: Every day | ORAL | 3 refills | Status: AC
Start: 2023-10-04 — End: ?

## 2023-10-04 NOTE — Progress Notes (Signed)
 [facility] [address] [place], [place] [address]  Internal MEDICINE  Office Visit Note  Patient Name: Christina Hester  [id number]  [id number]  Date of Service: 10/04/2023  Chief Complaint  Patient presents with   Hypertension   Hyperlipidemia   Follow-up    HPI Sincerity presents for a follow-up visit for anxiety, COPD, arthritis and GERD.  Anxiety-- takes lorazepam as needed. Due for refills. No issues COPD -- sees Dr. Karna Christmas, takes [facility] which is helping.  Red and swollen distal phalangeal joint of the right middle finger -- possible arthritis flare or gout attack  GERD -- taking pantoprazole     Current Medication: Outpatient Encounter Medications as of 10/04/2023  Medication Sig   amLODipine (NORVASC) 5 MG tablet Take 1 tablet (5 mg total) by mouth daily.   azithromycin (ZITHROMAX) 250 MG tablet Take 1 tablet by mouth every Monday, Wednesday, and Friday.   bisoprolol (ZEBETA) 5 MG tablet Take 1 tablet (5 mg total) by mouth daily.   buPROPion (WELLBUTRIN) 100 MG tablet Take 1 tablet (100 mg total) by mouth daily.   clopidogrel (PLAVIX) 75 MG tablet Take 1 tablet (75 mg total) by mouth daily.   clotrimazole (MYCELEX) 10 MG troche Take 1 tablet (10 mg total) by mouth daily as needed (thrush).   Ensifentrine (OHTUVAYRE) 3 MG/2.5ML SUSP Inhale 3 mg into the lungs in the morning and at bedtime.   ipratropium-albuterol (DUONEB) 0.5-2.5 (3) MG/3ML SOLN USE 1 VIAL IN NEBULIZER EVERY 4 HOURS   meloxicam (MOBIC) 15 MG tablet Take 1 tablet (15 mg total) by mouth daily.   nitroGLYCERIN (NITROSTAT) 0.4 MG SL tablet Place 1 tablet (0.4 mg total) under the tongue every 5 (five) minutes as needed for chest pain.   nystatin (MYCOSTATIN) 100000 UNIT/ML suspension Take 5 mLs (500,000 Units total) by mouth 4 (four) times daily. (Patient taking differently: Take 5 mLs by mouth as needed (thrush).)   ondansetron (ZOFRAN) 4 MG tablet Take 1 tablet (4 mg total) by mouth every 8  (eight) hours as needed for nausea or vomiting.   predniSONE (DELTASONE) 1 MG tablet Take 3 mg by mouth daily with breakfast.   [DISCONTINUED] LORazepam (ATIVAN) 0.5 MG tablet TAKE 1/2 TO 1 TABLET BY MOUTH EVERY 8 HOURS AS NEEDED FOR ANXIETY OR SLEEP   [DISCONTINUED] pantoprazole (PROTONIX) 40 MG tablet Take 1 tablet (40 mg total) by mouth daily.   [DISCONTINUED] VENTOLIN HFA 108 (90 Base) MCG/ACT inhaler INHALE 2 PUFFS INTO THE LUNGS EVERY 6 HOURS AS NEEDED FOR WHEEZING OR SHORTNESS OF BREATH.   LORazepam (ATIVAN) 0.5 MG tablet TAKE 1/2 TO 1 TABLET BY MOUTH EVERY 8 HOURS AS NEEDED FOR ANXIETY OR SLEEP   pantoprazole (PROTONIX) 40 MG tablet Take 1 tablet (40 mg total) by mouth daily.   VENTOLIN HFA 108 (90 Base) MCG/ACT inhaler INHALE 2 PUFFS INTO THE LUNGS EVERY 6 HOURS AS NEEDED FOR WHEEZING OR SHORTNESS OF BREATH.   No facility-administered encounter medications on file as of 10/04/2023.    Surgical History: Past Surgical History:  Procedure Laterality Date   ABDOMINAL HYSTERECTOMY     APPENDECTOMY     BREAST EXCISIONAL BIOPSY Left 80's or 90's   NEG   CARDIAC CATHETERIZATION     COLONOSCOPY N/A 05/22/2018   Procedure: COLONOSCOPY;  Surgeon: Toney Reil, MD;  Location: [facility];  Service: Gastroenterology;  Laterality: N/A;   COLONOSCOPY WITH PROPOFOL N/A 07/19/2020   Procedure: COLONOSCOPY WITH PROPOFOL;  Surgeon: [place], [place],  MD;  Location: [facility];  Service: Gastroenterology;  Laterality: N/A;   ENDARTERECTOMY Right 11/06/2019   Procedure: ENDARTERECTOMY CAROTID;  Surgeon: Annice Needy, MD;  Location: [facility];  Service: Vascular;  Laterality: Right;   LEFT HEART CATH Bilateral 03/05/2017   Procedure: Left Heart Cath with poss PCI;  Surgeon: Iran Ouch, MD;  Location: [facility];  Service: Cardiovascular;  Laterality: Bilateral;    Medical History: Past Medical History:  Diagnosis Date   AAA (abdominal aortic aneurysm) (HCC)    a. 05/2016  Abd U/S: 3.02 x 3.02 AAA.   Anxiety    CAD (coronary artery disease)    a. 02/2017 NSTEMI/Cath: LM nl, LAD mild dzs, D2 min irregs, LCX mild dzs, OM2 50ost, RCA 95p, 172m, L-->R collats, EF 55-65%.   COPD (chronic obstructive pulmonary disease) (HCC)    Diastolic dysfunction    a. 02/2017 Echo: EF 55-60%, gr1 DD, ? inf HK, mild MR.   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 05/2016 ABI's: R 1.22, L 1.11.   Tobacco abuse     Family History: Family History  Problem Relation Age of Onset   Breast cancer Mother    Dementia Mother    Hypertension Mother    Breast cancer Maternal Aunt    Breast cancer Maternal Aunt    Breast cancer Maternal Aunt    CVA Father     Social History   Socioeconomic History   Marital status: Divorced    Spouse name: Not on file   Number of children: Not on file   Years of education: Not on file   Highest education level: Not on file  Occupational History   Not on file  Tobacco Use   Smoking status: Former    Current packs/day: 0.00    Types: Cigarettes    Quit date: 07/12/2017    Years since quitting: 6.2   Smokeless tobacco: Never   Tobacco comments:    currently 90 days without smoking  Vaping Use   Vaping status: Never Used  Substance and Sexual Activity   Alcohol use: No   Drug use: No   Sexual activity: Not on file  Other Topics Concern   Not on file  Social History Narrative   Not on file   Social Drivers of Health   Financial Resource Strain: Low Risk  (09/27/2023)   Received from [facility]   Overall Financial Resource Strain (CARDIA)    Difficulty of Paying Living Expenses: Not hard at all  Food Insecurity: No Food Insecurity (09/27/2023)   Received from [facility]   Hunger Vital Sign    Worried About Running Out of Food in the Last Year: Never  true    Ran Out of Food in the Last Year: Never true  Transportation Needs: No Transportation Needs (09/27/2023)   Received from [facility] - Transportation    In the past 12 months, has lack of transportation kept you from medical appointments or from getting medications?: No    Lack of Transportation (Non-Medical): No  Physical Activity: Not on file  Stress: Not on file  Social Connections: Not on file  Intimate Partner Violence: Not At Risk (11/26/2022)   Humiliation, Afraid, Rape, and  Kick questionnaire    Fear of Current or Ex-Partner: No    Emotionally Abused: No    Physically Abused: No    Sexually Abused: No      Review of Systems  Constitutional:  Positive for fatigue.  HENT: Negative.    Respiratory:  Positive for cough and shortness of breath. Negative for chest tightness and wheezing.        Intermittent symptoms due to COPD  Cardiovascular: Negative.  Negative for chest pain and palpitations.  Gastrointestinal: Negative.  Negative for abdominal pain.  Musculoskeletal:  Positive for arthralgias, back pain and myalgias.  Neurological:  Negative for headaches.  Hematological:  Negative for adenopathy. Does not bruise/bleed easily.  Psychiatric/Behavioral:  Positive for sleep disturbance. Negative for self-injury and suicidal ideas. The patient is nervous/anxious.     Vital Signs: BP 135/70 Comment: 150/84  Pulse 83   Temp 98.3 F (36.8 C)   Resp 16   Ht 5' (1.524 m)   Wt 75 lb 3.2 oz (34.1 kg)   SpO2 97%   BMI 14.69 kg/m    Physical Exam Vitals reviewed.  Constitutional:      General: She is not in acute distress.    Appearance: Normal appearance. She is not ill-appearing.  HENT:     Head: Normocephalic and atraumatic.  Eyes:     Pupils: Pupils are equal, round, and reactive to light.  Cardiovascular:     Rate and Rhythm: Normal rate and regular rhythm.  Pulmonary:     Effort: Pulmonary effort is normal. No respiratory  distress.  Neurological:     Mental Status: She is alert and oriented to person, place, and time.  Psychiatric:        Mood and Affect: Mood normal.        Behavior: Behavior normal.        Assessment/Plan: 1. Advanced COPD (HCC) (Primary) Continue to follow up with Dr. Karna Christmas. Continue prn albuterol inhaler as prescribed.  - VENTOLIN HFA 108 (90 Base) MCG/ACT inhaler; INHALE 2 PUFFS INTO THE LUNGS EVERY 6 HOURS AS NEEDED FOR WHEEZING OR SHORTNESS OF BREATH.  Dispense: 18 g; Refill: 3  2. Gastroesophageal reflux disease without esophagitis Continue pantoprazole as prescribed.  - pantoprazole (PROTONIX) 40 MG tablet; Take 1 tablet (40 mg total) by mouth daily.  Dispense: 90 tablet; Refill: 3  3. Severe protein-calorie malnutrition (HCC) Gained 5 lbs since last office visit.   4. Generalized anxiety disorder Continue prn lorazepam as prescribed. Follow up in 3 months for additional refills.  - LORazepam (ATIVAN) 0.5 MG tablet; TAKE 1/2 TO 1 TABLET BY MOUTH EVERY 8 HOURS AS NEEDED FOR ANXIETY OR SLEEP  Dispense: 70 tablet; Refill: 2   General Counseling: Adra verbalizes understanding of the findings of todays visit and agrees with plan of treatment. I have discussed any further diagnostic evaluation that may be needed or ordered today. We also reviewed her medications today. she has been encouraged to call the office with any questions or concerns that should arise related to todays visit.    No orders of the defined types were placed in this encounter.   Meds ordered this encounter  Medications   LORazepam (ATIVAN) 0.5 MG tablet    Sig: TAKE 1/2 TO 1 TABLET BY MOUTH EVERY 8 HOURS AS NEEDED FOR ANXIETY OR SLEEP    Dispense:  70 tablet    Refill:  2    Please keep on file for future refills   pantoprazole (PROTONIX) 40  MG tablet    Sig: Take 1 tablet (40 mg total) by mouth daily.    Dispense:  90 tablet    Refill:  3   VENTOLIN HFA 108 (90 Base) MCG/ACT inhaler    Sig:  INHALE 2 PUFFS INTO THE LUNGS EVERY 6 HOURS AS NEEDED FOR WHEEZING OR SHORTNESS OF BREATH.    Dispense:  18 g    Refill:  3    Return in about 12 weeks (around 12/27/2023) for F/U, anxiety med refill, Shernell Saldierna PCP.   Total time spent:30 Minutes Time spent includes review of chart, medications, test results, and follow up plan with the patient.   [place] Controlled Substance Database was reviewed by me.  This patient was seen by Sallyanne Kuster, FNP-C in collaboration with Dr. Beverely Risen as a part of collaborative care agreement.   Halee Glynn R. Tedd Sias, MSN, FNP-C Internal medicine

## 2023-10-05 ENCOUNTER — Encounter: Payer: Self-pay | Admitting: Nurse Practitioner

## 2023-10-17 ENCOUNTER — Other Ambulatory Visit: Payer: Self-pay | Admitting: Nurse Practitioner

## 2023-10-17 DIAGNOSIS — J449 Chronic obstructive pulmonary disease, unspecified: Secondary | ICD-10-CM

## 2023-10-18 ENCOUNTER — Telehealth: Payer: Self-pay | Admitting: Nurse Practitioner

## 2023-10-18 NOTE — Telephone Encounter (Signed)
 Received Neb supply order from [facility]. Gave to [employer]

## 2023-10-22 ENCOUNTER — Telehealth: Payer: Self-pay | Admitting: Nurse Practitioner

## 2023-10-22 NOTE — Telephone Encounter (Signed)
 10/17/23 Neb supply order signed.  Faxed back to [employer]; [phone]. Scanned-Toni

## 2023-11-09 ENCOUNTER — Telehealth (INDEPENDENT_AMBULATORY_CARE_PROVIDER_SITE_OTHER): Payer: Self-pay

## 2023-11-09 NOTE — Telephone Encounter (Signed)
 She can come in with ABIs to see dew or  myself

## 2023-11-10 ENCOUNTER — Other Ambulatory Visit: Payer: Self-pay

## 2023-11-10 ENCOUNTER — Emergency Department
Admission: EM | Admit: 2023-11-10 | Discharge: 2023-11-10 | Disposition: A | Discharge status: Home / Self Care | Attending: Emergency Medicine | Admitting: Emergency Medicine

## 2023-11-10 ENCOUNTER — Emergency Department

## 2023-11-10 DIAGNOSIS — J449 Chronic obstructive pulmonary disease, unspecified: Secondary | ICD-10-CM | POA: Diagnosis not present

## 2023-11-10 DIAGNOSIS — I10 Essential (primary) hypertension: Secondary | ICD-10-CM | POA: Insufficient documentation

## 2023-11-10 DIAGNOSIS — R2242 Localized swelling, mass and lump, left lower limb: Secondary | ICD-10-CM | POA: Diagnosis not present

## 2023-11-10 DIAGNOSIS — M7989 Other specified soft tissue disorders: Secondary | ICD-10-CM | POA: Diagnosis not present

## 2023-11-10 DIAGNOSIS — I2510 Atherosclerotic heart disease of native coronary artery without angina pectoris: Secondary | ICD-10-CM | POA: Diagnosis not present

## 2023-11-10 NOTE — Discharge Instructions (Signed)
 You were evaluated in the ED for left foot swelling.  Your ultrasound DVT study is negative for a blood clot.  Continue to use compression socks and keep your leg elevated. follow-up closely with your vascular doctor.  Call and schedule appointment on Monday.

## 2023-11-10 NOTE — ED Provider Notes (Signed)
 [facility] Emergency Department Provider Note     Event Date/Time   First MD Initiated Contact with Patient 11/10/23 1303     (approximate)   History   Leg Swelling (L foot)   HPI  Christina Hester is a 83 y.o. female with a history of HTN, HLD, COPD, CAD and PAD who presents to the ED for evaluation of left foot swelling.  Patient notices swelling intermittently since 3/29 but reports it has been constant for the past 2 days.  She denies any pain.  She states the swelling is worse at night.  She has tried compression socks and new shoes but states swelling has persisted.  Denies history of DVT, recent travel or recent surgical history.  Denies trauma or injury.     Physical Exam   Triage Vital Signs: ED Triage Vitals  Encounter Vitals Group     BP 11/10/23 1154 (!) 176/74     Systolic BP Percentile --      Diastolic BP Percentile --      Pulse Rate 11/10/23 1154 79     Resp 11/10/23 1154 20     Temp 11/10/23 1154 98.1 F (36.7 C)     Temp Source 11/10/23 1154 Oral     SpO2 11/10/23 1154 93 %     Weight 11/10/23 1155 75 lb (34 kg)     Height 11/10/23 1155 5' (1.524 m)     Head Circumference --      Peak Flow --      Pain Score 11/10/23 1152 5     Pain Loc --      Pain Education --      Exclude from Growth Chart --     Most recent vital signs: Vitals:   11/10/23 1154  BP: (!) 176/74  Pulse: 79  Resp: 20  Temp: 98.1 F (36.7 C)  SpO2: 93%    General Awake, no distress.  HEENT NCAT.  CV:  Good peripheral perfusion.  RESP:  Normal effort.  ABD:  No distention.  Other:  superficial varicosities noted on bilateral feet and ankles. No edema noted. No calf pain.  Skin intact all throughout.  FROM of ankle joint and all digits. palpable pedis pulses bilaterally -symmetric.    ED Results / Procedures / Treatments   Labs (all labs ordered are listed, but only abnormal results are displayed) Labs Reviewed - No data to  display  RADIOLOGY  I personally viewed and evaluated these images as part of my medical decision making, as well as reviewing the written report by the radiologist.  ED Provider Interpretation: Negative DVT  US  Venous Img Lower  Left (DVT Study) Result Date: 11/10/2023 CLINICAL DATA:  Left lower extremity swelling EXAM: LEFT LOWER EXTREMITY VENOUS DOPPLER ULTRASOUND TECHNIQUE: Gray-scale sonography with graded compression, as well as color Doppler and duplex ultrasound were performed to evaluate the lower extremity deep venous systems from the level of the common femoral vein and including the common femoral, femoral, profunda femoral, popliteal and calf veins including the posterior tibial, peroneal and gastrocnemius veins when visible. Spectral Doppler was utilized to evaluate flow at rest and with distal augmentation maneuvers in the common femoral, femoral and popliteal veins. COMPARISON:  None Available. FINDINGS: Contralateral Common Femoral Vein: Respiratory phasicity is normal and symmetric with the symptomatic side. No evidence of thrombus. Normal compressibility. Common Femoral Vein: No evidence of thrombus. Normal compressibility, respiratory phasicity and response to augmentation. Saphenofemoral Junction: No evidence  of thrombus. Normal compressibility and flow on color Doppler imaging. Profunda Femoral Vein: No evidence of thrombus. Normal compressibility and flow on color Doppler imaging. Femoral Vein: No evidence of thrombus. Normal compressibility, respiratory phasicity and response to augmentation. Popliteal Vein: No evidence of thrombus. Normal compressibility, respiratory phasicity and response to augmentation. Calf Veins: No evidence of thrombus. Normal compressibility and flow on color Doppler imaging. IMPRESSION: No evidence of deep venous thrombosis. Electronically Signed   By: Melven Stable.  Shick M.D.   On: 11/10/2023 14:42   PROCEDURES:  Critical Care performed:  No  Procedures  MEDICATIONS ORDERED IN ED: Medications - No data to display  IMPRESSION / MDM / ASSESSMENT AND PLAN / ED COURSE  I reviewed the triage vital signs and the nursing notes.                               83 y.o. female presents to the emergency department for evaluation and treatment of acute left foot swelling. See HPI for further details.   Differential diagnosis includes, but is not limited to DVT, contusion, cellulitis, venous stasis  Patient's presentation is most consistent with acute complicated illness / injury requiring diagnostic workup.  Patient is alert and oriented.  She is hemodynamically stable.  Physical exam findings are as stated above.  Negative ultrasound ruling out DVT.  Encourage close follow-up with her vascular provider, Dr. Vonna Hester. Patient verbalized understanding. Encouraged to continue compressions socks and RICE education provided.  Patient is in stable and satisfactory position for discharge home.  ED return precautions discussed.  FINAL CLINICAL IMPRESSION(S) / ED DIAGNOSES   Final diagnoses:  Swelling of left foot     Rx / DC Orders   ED Discharge Orders     None        Note:  This document was prepared using Dragon voice recognition software and may include unintentional dictation errors.    Alvaro Augusta A, PA-C 11/10/23 1540    Twilla Galea, MD 11/10/23 [id number]

## 2023-11-10 NOTE — ED Triage Notes (Signed)
 Pt to ED for intermittent L foot swelling and discoloration (purplish--appears to have some venous congestion) since 3/29. Pedal pulse present. Swelling comes and goes. L foot is slightly darker in color and swollen compared to R. No calf swelling or pain. States was hit to same leg with soccer ball last week.

## 2023-11-12 NOTE — Telephone Encounter (Signed)
 Christina Hester left a voicemail returning a call. Can someone call her and make an appointment to to see Christina or Christina Hester wirh ABIs.

## 2023-11-13 ENCOUNTER — Encounter (INDEPENDENT_AMBULATORY_CARE_PROVIDER_SITE_OTHER): Payer: Self-pay | Admitting: Vascular Surgery

## 2023-11-13 ENCOUNTER — Ambulatory Visit (INDEPENDENT_AMBULATORY_CARE_PROVIDER_SITE_OTHER): Admitting: Vascular Surgery

## 2023-11-13 VITALS — BP 129/65 | HR 76 | Resp 16 | Wt 75.2 lb

## 2023-11-13 DIAGNOSIS — I2510 Atherosclerotic heart disease of native coronary artery without angina pectoris: Secondary | ICD-10-CM

## 2023-11-13 DIAGNOSIS — I7143 Infrarenal abdominal aortic aneurysm, without rupture: Secondary | ICD-10-CM

## 2023-11-13 DIAGNOSIS — M7989 Other specified soft tissue disorders: Secondary | ICD-10-CM | POA: Diagnosis not present

## 2023-11-13 DIAGNOSIS — I6523 Occlusion and stenosis of bilateral carotid arteries: Secondary | ICD-10-CM | POA: Diagnosis not present

## 2023-11-13 DIAGNOSIS — I2583 Coronary atherosclerosis due to lipid rich plaque: Secondary | ICD-10-CM

## 2023-11-13 DIAGNOSIS — I10 Essential (primary) hypertension: Secondary | ICD-10-CM | POA: Diagnosis not present

## 2023-11-13 NOTE — Assessment & Plan Note (Signed)
 Recently checked and stable.  To be checked next year.

## 2023-11-13 NOTE — Assessment & Plan Note (Signed)
 Recent ER visit showed negative DVT study.  Her swelling is back to her baseline.  Compression and elevation for swelling as needed.

## 2023-11-13 NOTE — Progress Notes (Signed)
 MRN : [id number]  Christina Hester is a 83 y.o. ([birth year]) female who presents with chief complaint of  Chief Complaint  Patient presents with   Follow-up    [facility] 2 day follow up  .  History of Present Illness: Patient returns today in follow up of recent [facility] ER visit this weekend.  She had been to a [employer] and was up on her feet for large parts of the day.  Her foot and lower leg on the left became quite swollen red.  She went to the emergency room and had a negative DVT study.  They recommended compression and elevation.  Since then, her swelling has gotten much better.  Her legs are back to their baseline.  No current pain.  No chest pain or increased shortness of breath over her baseline COPD.  Current Outpatient Medications  Medication Sig Dispense Refill   amLODipine  (NORVASC ) 5 MG tablet Take 1 tablet (5 mg total) by mouth daily. 90 tablet 3   azithromycin  (ZITHROMAX ) 250 MG tablet Take 1 tablet by mouth every Monday, Wednesday, and Friday.     bisoprolol  (ZEBETA ) 5 MG tablet Take 1 tablet (5 mg total) by mouth daily. 90 tablet 3   buPROPion  (WELLBUTRIN ) 100 MG tablet Take 1 tablet (100 mg total) by mouth daily. 90 tablet 3   clopidogrel  (PLAVIX ) 75 MG tablet Take 1 tablet (75 mg total) by mouth daily. 90 tablet 3   clotrimazole  (MYCELEX ) 10 MG troche Take 1 tablet (10 mg total) by mouth daily as needed (thrush). 70 Troche 0   Ensifentrine  (OHTUVAYRE ) 3 MG/2.5ML SUSP Inhale 3 mg into the lungs in the morning and at bedtime. 150 mL 11   ipratropium-albuterol  (DUONEB) 0.5-2.5 (3) MG/3ML SOLN USE 1 VIAL IN NEBULIZER EVERY 4 HOURS 120 mL 3   LORazepam  (ATIVAN ) 0.5 MG tablet TAKE 1/2 TO 1 TABLET BY MOUTH EVERY 8 HOURS AS NEEDED FOR ANXIETY OR SLEEP 70 tablet 2   meloxicam  (MOBIC ) 15 MG tablet Take 1 tablet (15 mg total) by mouth daily. 30 tablet 2   nitroGLYCERIN  (NITROSTAT ) 0.4 MG SL tablet Place 1 tablet (0.4 mg total) under the tongue every 5 (five) minutes as needed for  chest pain. 20 tablet 12   nystatin  (MYCOSTATIN ) 100000 UNIT/ML suspension Take 5 mLs (500,000 Units total) by mouth 4 (four) times daily. (Patient taking differently: Take 5 mLs by mouth as needed (thrush).) 200 mL 0   ondansetron  (ZOFRAN ) 4 MG tablet Take 1 tablet (4 mg total) by mouth every 8 (eight) hours as needed for nausea or vomiting. 20 tablet 0   pantoprazole  (PROTONIX ) 40 MG tablet Take 1 tablet (40 mg total) by mouth daily. 90 tablet 3   predniSONE  (DELTASONE ) 1 MG tablet Take 3 mg by mouth daily with breakfast.     VENTOLIN  HFA 108 (90 Base) MCG/ACT inhaler INHALE 2 PUFFS INTO THE LUNGS EVERY 6 HOURS AS NEEDED FOR WHEEZING OR SHORTNESS OF BREATH. 18 g 3   No current facility-administered medications for this visit.    Past Medical History:  Diagnosis Date   AAA (abdominal aortic aneurysm) (HCC)    a. 05/2016 Abd U/S: 3.02 x 3.02 AAA.   Anxiety    CAD (coronary artery disease)    a. 02/2017 NSTEMI/Cath: LM nl, LAD mild dzs, D2 min irregs, LCX mild dzs, OM2 50ost, RCA 95p, 156m, L-->R collats, EF 55-65%.   COPD (chronic obstructive pulmonary disease) (HCC)    Diastolic dysfunction  a. 02/2017 Echo: EF 55-60%, gr1 DD, ? inf HK, mild MR.   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 05/2016 ABI's: R 1.22, L 1.11.   Tobacco abuse     Past Surgical History:  Procedure Laterality Date   ABDOMINAL HYSTERECTOMY     APPENDECTOMY     BREAST EXCISIONAL BIOPSY Left 80's or 90's   NEG   CARDIAC CATHETERIZATION     COLONOSCOPY N/A 05/22/2018   Procedure: COLONOSCOPY;  Surgeon: Selena Daily, MD;  Location: [facility] ENDOSCOPY;  Service: Gastroenterology;  Laterality: N/A;   COLONOSCOPY WITH PROPOFOL  N/A 07/19/2020   Procedure: COLONOSCOPY WITH PROPOFOL ;  Surgeon: Toledo, Alphonsus Jeans, MD;  Location: [facility] ENDOSCOPY;  Service: Gastroenterology;   Laterality: N/A;   ENDARTERECTOMY Right 11/06/2019   Procedure: ENDARTERECTOMY CAROTID;  Surgeon: Celso College, MD;  Location: [facility] ORS;  Service: Vascular;  Laterality: Right;   LEFT HEART CATH Bilateral 03/05/2017   Procedure: Left Heart Cath with poss PCI;  Surgeon: Wenona Hamilton, MD;  Location: [facility] INVASIVE CV LAB;  Service: Cardiovascular;  Laterality: Bilateral;     Social History   Tobacco Use   Smoking status: Former    Current packs/day: 0.00    Types: Cigarettes    Quit date: 07/12/2017    Years since quitting: 6.3   Smokeless tobacco: Never   Tobacco comments:    currently 90 days without smoking  Vaping Use   Vaping status: Never Used  Substance Use Topics   Alcohol use: No   Drug use: No      Family History  Problem Relation Age of Onset   Breast cancer Mother    Dementia Mother    Hypertension Mother    Breast cancer Maternal Aunt    Breast cancer Maternal Aunt    Breast cancer Maternal Aunt    CVA Father      Allergies  Allergen Reactions   Hydrocodone -Acetaminophen  Other (See Comments)    Facial numbness   Aspirin  Tinitus   Budesonide -Formoterol  Fumarate Other (See Comments)    Patient felt "out of her mind" and angry. Patient felt "out of her mind" and angry.   Prednisone  Other (See Comments)    Makes violent. Patient tolerates low doses   Sulfa Antibiotics Nausea And Vomiting   Tramadol  Other (See Comments)    REVIEW OF SYSTEMS (Negative unless checked)   Constitutional: [] Weight loss  [] Fever  [] Chills Cardiac: [] Chest pain   [] Chest pressure   [] Palpitations   [] Shortness of breath when laying flat   [] Shortness of breath at rest   [x] Shortness of breath with exertion. Vascular:  [] Pain in legs with walking   [] Pain in legs at rest   [] Pain in legs when laying flat   [] Claudication   [] Pain in feet when walking  [] Pain in feet at rest  [] Pain in feet when laying flat   [] History of DVT   [] Phlebitis   [] Swelling in legs   [] Varicose  veins   [] Non-healing ulcers Pulmonary:   [x] Uses home oxygen    [] Productive cough   [] Hemoptysis   [] Wheeze  [x] COPD   [] Asthma Neurologic:  [] Dizziness  [] Blackouts   [] Seizures   [] History of stroke   [] History of TIA  [] Aphasia   [] Temporary blindness   [] Dysphagia   []   Weakness or numbness in arms   [] Weakness or numbness in legs Musculoskeletal:  [x] Arthritis   [] Joint swelling   [x] Joint pain   [x] Low back pain Hematologic:  [] Easy bruising  [] Easy bleeding   [] Hypercoagulable state   [] Anemic   Gastrointestinal:  [] Blood in stool   [] Vomiting blood  [] Gastroesophageal reflux/heartburn   [] Abdominal pain Genitourinary:  [] Chronic kidney disease   [] Difficult urination  [] Frequent urination  [] Burning with urination   [] Hematuria Skin:  [] Rashes   [] Ulcers   [] Wounds Psychological:  [x] History of anxiety   []  History of major depression.  Physical Examination  BP 129/65   Pulse 76   Resp 16   Wt 75 lb 3.2 oz (34.1 kg)   BMI 14.69 kg/m  Gen:  NAD, thin Head: [place]/AT, + temporalis wasting. Ear/Nose/Throat: Hearing grossly intact, nares w/o erythema or drainage Eyes: Conjunctiva clear. Sclera non-icteric Neck: Supple.  Trachea midline Pulmonary:  Good air movement, no use of accessory muscles.  Cardiac: RRR, no JVD Vascular:  Vessel Right Left  Radial Palpable Palpable                          PT 1+ Palpable 1+ Palpable  DP 1+ Palpable 1+ Palpable   Gastrointestinal: soft, non-tender/non-distended. No guarding/reflex.  Musculoskeletal: M/S 5/5 throughout.  No deformity or atrophy.  Slightly sluggish capillary refill in the left lower extremity.  No appreciable edema present in either lower extremity today. Neurologic: Sensation grossly intact in extremities.  Symmetrical.  Speech is fluent.  Psychiatric: Judgment intact, Mood & affect appropriate for pt's clinical situation. Dermatologic: No rashes or ulcers noted.  No cellulitis or open wounds.      Labs Recent  Results (from the past 2160 hours)  VAS US  ABI WITH/WO TBI     Status: None   Collection Time: 09/11/23  9:15 AM  Result Value Ref Range   Right ABI 0.79    Left ABI 0.82     Radiology US  Venous Img Lower  Left (DVT Study) Result Date: 11/10/2023 CLINICAL DATA:  Left lower extremity swelling EXAM: LEFT LOWER EXTREMITY VENOUS DOPPLER ULTRASOUND TECHNIQUE: Gray-scale sonography with graded compression, as well as color Doppler and duplex ultrasound were performed to evaluate the lower extremity deep venous systems from the level of the common femoral vein and including the common femoral, femoral, profunda femoral, popliteal and calf veins including the posterior tibial, peroneal and gastrocnemius veins when visible. Spectral Doppler was utilized to evaluate flow at rest and with distal augmentation maneuvers in the common femoral, femoral and popliteal veins. COMPARISON:  None Available. FINDINGS: Contralateral Common Femoral Vein: Respiratory phasicity is normal and symmetric with the symptomatic side. No evidence of thrombus. Normal compressibility. Common Femoral Vein: No evidence of thrombus. Normal compressibility, respiratory phasicity and response to augmentation. Saphenofemoral Junction: No evidence of thrombus. Normal compressibility and flow on color Doppler imaging. Profunda Femoral Vein: No evidence of thrombus. Normal compressibility and flow on color Doppler imaging. Femoral Vein: No evidence of thrombus. Normal compressibility, respiratory phasicity and response to augmentation. Popliteal Vein: No evidence of thrombus. Normal compressibility, respiratory phasicity and response to augmentation. Calf Veins: No evidence of thrombus. Normal compressibility and flow on color Doppler imaging. IMPRESSION: No evidence of deep venous thrombosis. Electronically Signed   By: Melven Stable.  Shick M.D.   On: 11/10/2023 14:42    Assessment/Plan  Swelling of limb Recent ER visit showed negative DVT study.  Her  swelling is back to  her baseline.  Compression and elevation for swelling as needed.  AAA (abdominal aortic aneurysm) without rupture (HCC) Recently checked and found to be 3.8 cm.  Following up at her regular scheduled appointment.  Carotid stenosis Recently checked and stable.  To be checked next year.  Benign hypertension blood pressure control important in reducing the progression of atherosclerotic disease. On appropriate oral medications.   Coronary artery disease Has been seen by cardiology.  No issues with surgery earlier this year.    Mikki Alexander, MD  11/13/2023 10:16 AM    This note was created with Dragon medical transcription system.  Any errors from dictation are purely unintentional

## 2023-11-13 NOTE — Assessment & Plan Note (Signed)
 Recently checked and found to be 3.8 cm.  Following up at her regular scheduled appointment.

## 2023-11-13 NOTE — Telephone Encounter (Signed)
 Pt was seen in office today by Dr Vonna Guardian with no ABI

## 2023-11-15 ENCOUNTER — Telehealth: Payer: Self-pay | Admitting: Nurse Practitioner

## 2023-11-15 NOTE — Telephone Encounter (Signed)
 Contacted patient to schedule ED follow up, she stated she has appointment with vein & vascular-[facility]

## 2023-11-23 DIAGNOSIS — J441 Chronic obstructive pulmonary disease with (acute) exacerbation: Secondary | ICD-10-CM | POA: Diagnosis not present

## 2023-11-23 DIAGNOSIS — R0600 Dyspnea, unspecified: Secondary | ICD-10-CM | POA: Diagnosis not present

## 2023-11-23 DIAGNOSIS — R0602 Shortness of breath: Secondary | ICD-10-CM | POA: Diagnosis not present

## 2023-12-04 ENCOUNTER — Encounter (INDEPENDENT_AMBULATORY_CARE_PROVIDER_SITE_OTHER): Payer: Self-pay

## 2023-12-26 ENCOUNTER — Ambulatory Visit (INDEPENDENT_AMBULATORY_CARE_PROVIDER_SITE_OTHER): Admitting: Nurse Practitioner

## 2023-12-26 ENCOUNTER — Encounter: Payer: Self-pay | Admitting: Nurse Practitioner

## 2023-12-26 VITALS — BP 130/80 | HR 76 | Temp 97.4°F | Resp 16 | Ht 60.0 in | Wt 73.8 lb

## 2023-12-26 DIAGNOSIS — J449 Chronic obstructive pulmonary disease, unspecified: Secondary | ICD-10-CM | POA: Diagnosis not present

## 2023-12-26 DIAGNOSIS — E43 Unspecified severe protein-calorie malnutrition: Secondary | ICD-10-CM | POA: Diagnosis not present

## 2023-12-26 DIAGNOSIS — I10 Essential (primary) hypertension: Secondary | ICD-10-CM

## 2023-12-26 DIAGNOSIS — Z79899 Other long term (current) drug therapy: Secondary | ICD-10-CM | POA: Diagnosis not present

## 2023-12-26 DIAGNOSIS — B370 Candidal stomatitis: Secondary | ICD-10-CM

## 2023-12-26 DIAGNOSIS — F411 Generalized anxiety disorder: Secondary | ICD-10-CM

## 2023-12-26 MED ORDER — OHTUVAYRE 3 MG/2.5ML IN SUSP: 3.0000 mg | Freq: Two times a day (BID) | RESPIRATORY_TRACT | Status: AC

## 2023-12-26 MED ORDER — ONDANSETRON HCL 4 MG PO TABS
4.0000 mg | ORAL_TABLET | Freq: Three times a day (TID) | ORAL | 5 refills | Status: AC | PRN
Start: 2023-12-26 — End: ?

## 2023-12-26 MED ORDER — CLOPIDOGREL BISULFATE 75 MG PO TABS
75.0000 mg | ORAL_TABLET | Freq: Every day | ORAL | 3 refills | Status: AC
Start: 2023-12-26 — End: ?

## 2023-12-26 MED ORDER — VENTOLIN HFA 108 (90 BASE) MCG/ACT IN AERS: INHALATION_SPRAY | RESPIRATORY_TRACT | 3 refills | Status: AC

## 2023-12-26 MED ORDER — LORAZEPAM 0.5 MG PO TABS
ORAL_TABLET | ORAL | 2 refills | Status: DC
Start: 2023-12-26 — End: 2024-03-25

## 2023-12-26 MED ORDER — AMLODIPINE BESYLATE 5 MG PO TABS: 5.0000 mg | ORAL_TABLET | Freq: Every day | ORAL | 3 refills | Status: DC

## 2023-12-26 MED ORDER — BISOPROLOL FUMARATE 5 MG PO TABS
5.0000 mg | ORAL_TABLET | Freq: Every day | ORAL | 3 refills | Status: AC
Start: 2023-12-26 — End: ?

## 2023-12-26 MED ORDER — NYSTATIN 100000 UNIT/ML MT SUSP: 5.0000 mL | Freq: Four times a day (QID) | OROMUCOSAL | 0 refills | Status: AC

## 2023-12-26 NOTE — Progress Notes (Signed)
 [facility] [address] [place], [place] [address]  Internal MEDICINE  Office Visit Note  Patient Name: Christina Hester  [id number]  [id number]  Date of Service: 12/26/2023  Chief Complaint  Patient presents with   Hyperlipidemia   Hypertension   Follow-up    HPI Christalynn presents for a follow-up visit for COPD, anxiety, hypertension and thrush COPD -- sees pulmonology and is on neb treatments, ohtuvayre , and chronic azithromycin  3 times a week.  Anxiety -- takes lorazepam  twice daily as needed for increased anxiety and panic attacks.  Hypertension -- controlled with bisoprolol  and amlodipine .  Oral thrush -- repeat episodes of thrush due to inhaler use and steroid use.    Current Medication: Outpatient Encounter Medications as of 12/26/2023  Medication Sig   azithromycin  (ZITHROMAX ) 250 MG tablet Take 1 tablet by mouth every Monday, Wednesday, and Friday.   buPROPion  (WELLBUTRIN ) 100 MG tablet Take 1 tablet (100 mg total) by mouth daily.   clotrimazole  (MYCELEX ) 10 MG troche Take 1 tablet (10 mg total) by mouth daily as needed (thrush).   ipratropium-albuterol  (DUONEB) 0.5-2.5 (3) MG/3ML SOLN USE 1 VIAL IN NEBULIZER EVERY 4 HOURS   meloxicam  (MOBIC ) 15 MG tablet Take 1 tablet (15 mg total) by mouth daily.   nitroGLYCERIN  (NITROSTAT ) 0.4 MG SL tablet Place 1 tablet (0.4 mg total) under the tongue every 5 (five) minutes as needed for chest pain.   nystatin  (MYCOSTATIN ) 100000 UNIT/ML suspension Take 5 mLs (500,000 Units total) by mouth 4 (four) times daily. Until thrush is resolved. May keep as needed for recurrences.   pantoprazole  (PROTONIX ) 40 MG tablet Take 1 tablet (40 mg total) by mouth daily.   predniSONE  (DELTASONE ) 1 MG tablet Take 3 mg by mouth daily with breakfast.   [DISCONTINUED] amLODipine  (NORVASC ) 5 MG tablet Take 1 tablet (5 mg total) by mouth daily.   [DISCONTINUED] bisoprolol  (ZEBETA ) 5 MG tablet Take 1 tablet (5 mg total) by mouth daily.    [DISCONTINUED] clopidogrel  (PLAVIX ) 75 MG tablet Take 1 tablet (75 mg total) by mouth daily.   [DISCONTINUED] Ensifentrine  (OHTUVAYRE ) 3 MG/2.5ML SUSP Inhale 3 mg into the lungs in the morning and at bedtime.   [DISCONTINUED] LORazepam  (ATIVAN ) 0.5 MG tablet TAKE 1/2 TO 1 TABLET BY MOUTH EVERY 8 HOURS AS NEEDED FOR ANXIETY OR SLEEP   [DISCONTINUED] nystatin  (MYCOSTATIN ) 100000 UNIT/ML suspension Take 5 mLs (500,000 Units total) by mouth 4 (four) times daily. (Patient taking differently: Take 5 mLs by mouth as needed (thrush).)   [DISCONTINUED] ondansetron  (ZOFRAN ) 4 MG tablet Take 1 tablet (4 mg total) by mouth every 8 (eight) hours as needed for nausea or vomiting.   [DISCONTINUED] VENTOLIN  HFA 108 (90 Base) MCG/ACT inhaler INHALE 2 PUFFS INTO THE LUNGS EVERY 6 HOURS AS NEEDED FOR WHEEZING OR SHORTNESS OF BREATH.   amLODipine  (NORVASC ) 5 MG tablet Take 1 tablet (5 mg total) by mouth daily.   bisoprolol  (ZEBETA ) 5 MG tablet Take 1 tablet (5 mg total) by mouth daily.   clopidogrel  (PLAVIX ) 75 MG tablet Take 1 tablet (75 mg total) by mouth daily.   Ensifentrine  (OHTUVAYRE ) 3 MG/2.5ML SUSP Inhale 3 mg into the lungs in the morning and at bedtime.   LORazepam  (ATIVAN ) 0.5 MG tablet TAKE 1/2 TO 1 TABLET BY MOUTH EVERY 8 HOURS AS NEEDED FOR ANXIETY OR SLEEP   ondansetron  (ZOFRAN ) 4 MG tablet Take 1 tablet (4 mg total) by mouth every 8 (eight) hours as needed for nausea or vomiting.  VENTOLIN  HFA 108 (90 Base) MCG/ACT inhaler INHALE 2 PUFFS INTO THE LUNGS EVERY 6 HOURS AS NEEDED FOR WHEEZING OR SHORTNESS OF BREATH.   No facility-administered encounter medications on file as of 12/26/2023.    Surgical History: Past Surgical History:  Procedure Laterality Date   ABDOMINAL HYSTERECTOMY     APPENDECTOMY     BREAST EXCISIONAL BIOPSY Left 80's or 90's   NEG   CARDIAC CATHETERIZATION     COLONOSCOPY N/A 05/22/2018   Procedure: COLONOSCOPY;  Surgeon: Selena Daily, MD;  Location: [facility] ENDOSCOPY;   Service: Gastroenterology;  Laterality: N/A;   COLONOSCOPY WITH PROPOFOL  N/A 07/19/2020   Procedure: COLONOSCOPY WITH PROPOFOL ;  Surgeon: Toledo, Alphonsus Jeans, MD;  Location: [facility] ENDOSCOPY;  Service: Gastroenterology;  Laterality: N/A;   ENDARTERECTOMY Right 11/06/2019   Procedure: ENDARTERECTOMY CAROTID;  Surgeon: Celso College, MD;  Location: [facility] ORS;  Service: Vascular;  Laterality: Right;   LEFT HEART CATH Bilateral 03/05/2017   Procedure: Left Heart Cath with poss PCI;  Surgeon: Wenona Hamilton, MD;  Location: [facility] INVASIVE CV LAB;  Service: Cardiovascular;  Laterality: Bilateral;    Medical History: Past Medical History:  Diagnosis Date   AAA (abdominal aortic aneurysm) (HCC)    a. 05/2016 Abd U/S: 3.02 x 3.02 AAA.   Anxiety    CAD (coronary artery disease)    a. 02/2017 NSTEMI/Cath: LM nl, LAD mild dzs, D2 min irregs, LCX mild dzs, OM2 50ost, RCA 95p, 116m, L-->R collats, EF 55-65%.   COPD (chronic obstructive pulmonary disease) (HCC)    Diastolic dysfunction    a. 02/2017 Echo: EF 55-60%, gr1 DD, ? inf HK, mild MR.   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 05/2016 ABI's: R 1.22, L 1.11.   Tobacco abuse     Family History: Family History  Problem Relation Age of Onset   Breast cancer Mother    Dementia Mother    Hypertension Mother    Breast cancer Maternal Aunt    Breast cancer Maternal Aunt    Breast cancer Maternal Aunt    CVA Father     Social History   Socioeconomic History   Marital status: Divorced    Spouse name: Not on file   Number of children: Not on file   Years of education: Not on file   Highest education level: Not on file  Occupational History   Not on file  Tobacco Use   Smoking status: Former    Current packs/day: 0.00    Types: Cigarettes    Quit date: 07/12/2017    Years since quitting: 6.4    Smokeless tobacco: Never   Tobacco comments:    currently 90 days without smoking  Vaping Use   Vaping status: Never Used  Substance and Sexual Activity   Alcohol use: No   Drug use: No   Sexual activity: Not on file  Other Topics Concern   Not on file  Social History Narrative   Not on file   Social Drivers of Health   Financial Resource Strain: Low Risk  (11/23/2023)   Received from [facility]   Overall Financial Resource Strain (CARDIA)    Difficulty of Paying Living Expenses: Not very hard  Food Insecurity: No Food Insecurity (11/23/2023)   Received from  [facility]   Hunger Vital Sign    Worried About Running Out of Food in the Last Year: Never true    Ran Out of Food in the Last Year: Never true  Transportation Needs: No Transportation Needs (11/23/2023)   Received from [facility] - Transportation    In the past 12 months, has lack of transportation kept you from medical appointments or from getting medications?: No    Lack of Transportation (Non-Medical): No  Physical Activity: Not on file  Stress: Not on file  Social Connections: Not on file  Intimate Partner Violence: Not At Risk (11/26/2022)   Humiliation, Afraid, Rape, and Kick questionnaire    Fear of Current or Ex-Partner: No    Emotionally Abused: No    Physically Abused: No    Sexually Abused: No      Review of Systems  Constitutional:  Positive for fatigue.  HENT: Negative.    Respiratory:  Positive for cough and shortness of breath. Negative for chest tightness and wheezing.        Intermittent symptoms due to COPD  Cardiovascular: Negative.  Negative for chest pain and palpitations.  Gastrointestinal: Negative.  Negative for abdominal pain.  Musculoskeletal:  Positive for arthralgias, back pain and myalgias.  Neurological:  Negative for headaches.  Hematological:  Negative for adenopathy. Does not bruise/bleed easily.   Psychiatric/Behavioral:  Positive for sleep disturbance. Negative for self-injury and suicidal ideas. The patient is nervous/anxious.     Vital Signs: BP 130/80   Pulse 76   Temp (!) 97.4 F (36.3 C)   Resp 16   Ht 5' (1.524 m)   Wt 73 lb 12.8 oz (33.5 kg)   SpO2 94%   BMI 14.41 kg/m    Physical Exam Vitals reviewed.  Constitutional:      General: She is not in acute distress.    Appearance: Normal appearance. She is not ill-appearing.  HENT:     Head: Normocephalic and atraumatic.  Eyes:     Pupils: Pupils are equal, round, and reactive to light.  Cardiovascular:     Rate and Rhythm: Normal rate and regular rhythm.  Pulmonary:     Effort: Pulmonary effort is normal. No respiratory distress.  Neurological:     Mental Status: She is alert and oriented to person, place, and time.  Psychiatric:        Mood and Affect: Mood normal.        Behavior: Behavior normal.        Assessment/Plan: 1. Essential hypertension (Primary) Stable, continue bisoprolol  and amlodipine  as prescribed.  - bisoprolol  (ZEBETA ) 5 MG tablet; Take 1 tablet (5 mg total) by mouth daily.  Dispense: 90 tablet; Refill: 3 - amLODipine  (NORVASC ) 5 MG tablet; Take 1 tablet (5 mg total) by mouth daily.  Dispense: 90 tablet; Refill: 3  2. Advanced COPD (HCC) Sees pulmonology with Dr. Aleskerov and she is on ohtuvayre  twice daily.  - Ensifentrine  (OHTUVAYRE ) 3 MG/2.5ML SUSP; Inhale 3 mg into the lungs in the morning and at bedtime. - VENTOLIN  HFA 108 (90 Base) MCG/ACT inhaler; INHALE 2 PUFFS INTO THE LUNGS EVERY 6 HOURS AS NEEDED FOR WHEEZING OR SHORTNESS OF BREATH.  Dispense: 18 g; Refill: 3  3. Severe protein-calorie malnutrition (HCC) Continue to monitor, encouraged patient to continue with ensure protein shakes or other nutritional supplements that are similar.   4. Thrush Nystatin  liquid prescribed.  - nystatin  (MYCOSTATIN ) 100000 UNIT/ML suspension; Take 5 mLs (  500,000 Units total) by mouth 4  (four) times daily. Until thrush is resolved. May keep as needed for recurrences.  Dispense: 473 mL; Refill: 0  5. Encounter for medication review Medication list reviewed, updated and refills ordered.  - clopidogrel  (PLAVIX ) 75 MG tablet; Take 1 tablet (75 mg total) by mouth daily.  Dispense: 90 tablet; Refill: 3 - ondansetron  (ZOFRAN ) 4 MG tablet; Take 1 tablet (4 mg total) by mouth every 8 (eight) hours as needed for nausea or vomiting.  Dispense: 20 tablet; Refill: 5  6. Generalized anxiety disorder Continue prn lorazepam  as prescribed.  - LORazepam  (ATIVAN ) 0.5 MG tablet; TAKE 1/2 TO 1 TABLET BY MOUTH EVERY 8 HOURS AS NEEDED FOR ANXIETY OR SLEEP  Dispense: 70 tablet; Refill: 2   General Counseling: Mikaylah verbalizes understanding of the findings of todays visit and agrees with plan of treatment. I have discussed any further diagnostic evaluation that may be needed or ordered today. We also reviewed her medications today. she has been encouraged to call the office with any questions or concerns that should arise related to todays visit.    No orders of the defined types were placed in this encounter.   Meds ordered this encounter  Medications   LORazepam  (ATIVAN ) 0.5 MG tablet    Sig: TAKE 1/2 TO 1 TABLET BY MOUTH EVERY 8 HOURS AS NEEDED FOR ANXIETY OR SLEEP    Dispense:  70 tablet    Refill:  2    Please keep on file for future refills   clopidogrel  (PLAVIX ) 75 MG tablet    Sig: Take 1 tablet (75 mg total) by mouth daily.    Dispense:  90 tablet    Refill:  3    For future refills   bisoprolol  (ZEBETA ) 5 MG tablet    Sig: Take 1 tablet (5 mg total) by mouth daily.    Dispense:  90 tablet    Refill:  3    For future refills   amLODipine  (NORVASC ) 5 MG tablet    Sig: Take 1 tablet (5 mg total) by mouth daily.    Dispense:  90 tablet    Refill:  3    For future refills   nystatin  (MYCOSTATIN ) 100000 UNIT/ML suspension    Sig: Take 5 mLs (500,000 Units total) by mouth 4  (four) times daily. Until thrush is resolved. May keep as needed for recurrences.    Dispense:  473 mL    Refill:  0    Fill new script today.   Ensifentrine  (OHTUVAYRE ) 3 MG/2.5ML SUSP    Sig: Inhale 3 mg into the lungs in the morning and at bedtime.    Was prescribed by pulmonology and approved by [facility].   ondansetron  (ZOFRAN ) 4 MG tablet    Sig: Take 1 tablet (4 mg total) by mouth every 8 (eight) hours as needed for nausea or vomiting.    Dispense:  20 tablet    Refill:  5    For future refills, fill new script today   VENTOLIN  HFA 108 (90 Base) MCG/ACT inhaler    Sig: INHALE 2 PUFFS INTO THE LUNGS EVERY 6 HOURS AS NEEDED FOR WHEEZING OR SHORTNESS OF BREATH.    Dispense:  18 g    Refill:  3    Return in about 12 weeks (around 03/19/2024) for AWV, and  anxiety med refill, Dosha Broshears PCP.   Total time spent:30 Minutes Time spent includes review of chart, medications, test results, and follow up plan  with the patient.   [place] Controlled Substance Database was reviewed by me.  This patient was seen by Laurence Pons, FNP-C in collaboration with Dr. Verneta Gone as a part of collaborative care agreement.   Ayaansh Smail R. Bobbi Burow, MSN, FNP-C Internal medicine

## 2023-12-27 ENCOUNTER — Encounter: Payer: Self-pay | Admitting: Nurse Practitioner

## 2023-12-27 ENCOUNTER — Telehealth: Payer: Self-pay

## 2023-12-31 ENCOUNTER — Other Ambulatory Visit: Payer: Self-pay | Admitting: Nurse Practitioner

## 2023-12-31 MED ORDER — ROPINIROLE HCL 0.25 MG PO TABS: 0.2500 mg | ORAL_TABLET | Freq: Every day | ORAL | 2 refills | Status: AC

## 2024-01-04 NOTE — Telephone Encounter (Signed)
 Pt advised by alyssa and sent med

## 2024-01-09 ENCOUNTER — Other Ambulatory Visit: Payer: Self-pay | Admitting: Nurse Practitioner

## 2024-01-09 DIAGNOSIS — F411 Generalized anxiety disorder: Secondary | ICD-10-CM

## 2024-01-22 DIAGNOSIS — R0609 Other forms of dyspnea: Secondary | ICD-10-CM | POA: Diagnosis not present

## 2024-01-22 DIAGNOSIS — F17200 Nicotine dependence, unspecified, uncomplicated: Secondary | ICD-10-CM | POA: Diagnosis not present

## 2024-01-22 DIAGNOSIS — F17218 Nicotine dependence, cigarettes, with other nicotine-induced disorders: Secondary | ICD-10-CM | POA: Diagnosis not present

## 2024-01-22 DIAGNOSIS — G2581 Restless legs syndrome: Secondary | ICD-10-CM | POA: Diagnosis not present

## 2024-01-22 DIAGNOSIS — J9621 Acute and chronic respiratory failure with hypoxia: Secondary | ICD-10-CM | POA: Diagnosis not present

## 2024-01-22 DIAGNOSIS — J449 Chronic obstructive pulmonary disease, unspecified: Secondary | ICD-10-CM | POA: Diagnosis not present

## 2024-01-28 ENCOUNTER — Ambulatory Visit: Payer: Medicare Other | Admitting: Nurse Practitioner

## 2024-02-05 ENCOUNTER — Encounter: Attending: Pulmonary Disease | Admitting: *Deleted

## 2024-02-05 DIAGNOSIS — J449 Chronic obstructive pulmonary disease, unspecified: Secondary | ICD-10-CM | POA: Insufficient documentation

## 2024-02-05 DIAGNOSIS — R0600 Dyspnea, unspecified: Secondary | ICD-10-CM

## 2024-02-05 DIAGNOSIS — R0609 Other forms of dyspnea: Secondary | ICD-10-CM | POA: Insufficient documentation

## 2024-02-05 NOTE — Progress Notes (Signed)
 Initial phone call completed. Diagnosis can be found in [facility] 7/8. EP Orientation scheduled for Thursday 7/24 at 2pm.   Zoya is a current tobacco user. Intervention for tobacco cessation was provided at the initial medical review. She was asked about readiness to quit and reported she is willing to quit. Patient was advised and educated about tobacco cessation using combination therapy, tobacco cessation classes, quit line, and quit smoking apps. Patient demonstrated understanding of this material. Staff will continue to provide encouragement and follow up with the patient throughout the program.

## 2024-02-07 ENCOUNTER — Encounter

## 2024-02-07 VITALS — Ht 58.6 in | Wt 75.7 lb

## 2024-02-07 DIAGNOSIS — J449 Chronic obstructive pulmonary disease, unspecified: Secondary | ICD-10-CM | POA: Diagnosis not present

## 2024-02-07 DIAGNOSIS — R0609 Other forms of dyspnea: Secondary | ICD-10-CM | POA: Diagnosis not present

## 2024-02-07 DIAGNOSIS — R0600 Dyspnea, unspecified: Secondary | ICD-10-CM

## 2024-02-07 NOTE — Patient Instructions (Signed)
 Patient Instructions  Patient Details  Name: Christina Hester MRN: [id number] Date of Birth: 1941/05/27 Referring Provider:  Parris Manna, MD  Below are your personal goals for exercise, nutrition, and risk factors. Our goal is to help you stay on track towards obtaining and maintaining these goals. We will be discussing your progress on these goals with you throughout the program.  Initial Exercise Prescription:  Initial Exercise Prescription - 02/07/24 1600       Date of Initial Exercise RX and Referring Provider   Date 02/07/24    Referring Provider Parris Manna, MD      Oxygen    Maintain Oxygen  Saturation 88% or higher      Treadmill   MPH 1.3   Try   Grade 0    Minutes 15    METs 2      Recumbant Bike   Level 1    RPM 50    Watts 15    Minutes 15    METs 1.99      NuStep   Level 1    SPM 80    Minutes 15    METs 1.99      Track   Laps 18    Minutes 15    METs 1.98      Prescription Details   Frequency (times per week) 2    Duration Progress to 30 minutes of continuous aerobic without signs/symptoms of physical distress      Intensity   THRR 40-80% of Max Heartrate 101-125    Ratings of Perceived Exertion 11-13    Perceived Dyspnea 0-4      Progression   Progression Continue to progress workloads to maintain intensity without signs/symptoms of physical distress.      Resistance Training   Training Prescription Yes    Weight 2 lb    Reps 10-15          Exercise Goals: Frequency: Be able to perform aerobic exercise two to three times per week in program working toward 2-5 days per week of home exercise.  Intensity: Work with a perceived exertion of 11 (fairly light) - 15 (hard) while following your exercise prescription.  We will make changes to your prescription with you as you progress through the program.   Duration: Be able to do 30 to 45 minutes of continuous aerobic exercise in addition to a 5 minute warm-up and a 5 minute cool-down  routine.   Nutrition Goals: Your personal nutrition goals will be established when you do your nutrition analysis with the dietician.  The following are general nutrition guidelines to follow: Cholesterol < 200mg /day Sodium < 1500mg /day Fiber: Women over 50 yrs - 21 grams per day  Personal Goals:  Personal Goals and Risk Factors at Admission - 02/07/24 1621       Core Components/Risk Factors/Patient Goals on Admission    Weight Management Yes;Weight Gain    Intervention Weight Management: Provide education and appropriate resources to help participant work on and attain dietary goals.;Weight Management: Develop a combined nutrition and exercise program designed to reach desired caloric intake, while maintaining appropriate intake of nutrient and fiber, sodium and fats, and appropriate energy expenditure required for the weight goal.;Weight Management/Obesity: Establish reasonable short term and long term weight goals.    Admit Weight 75 lb 8 oz (34.2 kg)    Goal Weight: Short Term 80 lb (36.3 kg)    Goal Weight: Long Term 90 lb (40.8 kg)    Expected Outcomes Short Term:  Continue to assess and modify interventions until short term weight is achieved;Long Term: Adherence to nutrition and physical activity/exercise program aimed toward attainment of established weight goal;Understanding recommendations for meals to include 15-35% energy as protein, 25-35% energy from fat, 35-60% energy from carbohydrates, less than 200mg  of dietary cholesterol, 20-35 gm of total fiber daily;Understanding of distribution of calorie intake throughout the day with the consumption of 4-5 meals/snacks;Weight Gain: Understanding of general recommendations for a high calorie, high protein meal plan that promotes weight gain by distributing calorie intake throughout the day with the consumption for 4-5 meals, snacks, and/or supplements    Tobacco Cessation Yes    Number of packs per day 0.15 ppd    Intervention Assist  the participant in steps to quit. Provide individualized education and counseling about committing to Tobacco Cessation, relapse prevention, and pharmacological support that can be provided by physician.;Education officer, environmental, assist with locating and accessing local/national Quit Smoking programs, and support quit date choice.    Expected Outcomes Short Term: Will demonstrate readiness to quit, by selecting a quit date.;Short Term: Will quit all tobacco product use, adhering to prevention of relapse plan.;Long Term: Complete abstinence from all tobacco products for at least 12 months from quit date.    Improve shortness of breath with ADL's Yes    Intervention Provide education, individualized exercise plan and daily activity instruction to help decrease symptoms of SOB with activities of daily living.    Expected Outcomes Short Term: Improve cardiorespiratory fitness to achieve a reduction of symptoms when performing ADLs;Long Term: Be able to perform more ADLs without symptoms or delay the onset of symptoms    Hypertension Yes    Intervention Provide education on lifestyle modifcations including regular physical activity/exercise, weight management, moderate sodium restriction and increased consumption of fresh fruit, vegetables, and low fat dairy, alcohol moderation, and smoking cessation.;Monitor prescription use compliance.    Expected Outcomes Short Term: Continued assessment and intervention until BP is < 140/90mm HG in hypertensive participants. < 130/44mm HG in hypertensive participants with diabetes, heart failure or chronic kidney disease.;Long Term: Maintenance of blood pressure at goal levels.          Tobacco Use Initial Evaluation: Social History   Tobacco Use  Smoking Status Every Day   Current packs/day: 0.00   Types: Cigarettes   Last attempt to quit: 07/12/2017   Years since quitting: 6.5  Smokeless Tobacco Never    Exercise Goals and Review:  Exercise Goals      Row Name 02/07/24 1613             Exercise Goals   Increase Physical Activity Yes       Intervention Provide advice, education, support and counseling about physical activity/exercise needs.;Develop an individualized exercise prescription for aerobic and resistive training based on initial evaluation findings, risk stratification, comorbidities and participant's personal goals.       Expected Outcomes Long Term: Add in home exercise to make exercise part of routine and to increase amount of physical activity.;Short Term: Attend rehab on a regular basis to increase amount of physical activity.;Long Term: Exercising regularly at least 3-5 days a week.       Increase Strength and Stamina Yes       Intervention Provide advice, education, support and counseling about physical activity/exercise needs.;Develop an individualized exercise prescription for aerobic and resistive training based on initial evaluation findings, risk stratification, comorbidities and participant's personal goals.       Expected Outcomes Short Term:  Increase workloads from initial exercise prescription for resistance, speed, and METs.;Short Term: Perform resistance training exercises routinely during rehab and add in resistance training at home;Long Term: Improve cardiorespiratory fitness, muscular endurance and strength as measured by increased METs and functional capacity ( )       Able to understand and use rate of perceived exertion (RPE) scale Yes       Intervention Provide education and explanation on how to use RPE scale       Expected Outcomes Short Term: Able to use RPE daily in rehab to express subjective intensity level;Long Term:  Able to use RPE to guide intensity level when exercising independently       Able to understand and use Dyspnea scale Yes       Intervention Provide education and explanation on how to use Dyspnea scale       Expected Outcomes Short Term: Able to use Dyspnea scale daily in rehab to  express subjective sense of shortness of breath during exertion;Long Term: Able to use Dyspnea scale to guide intensity level when exercising independently       Knowledge and understanding of Target Heart Rate Range (THRR) Yes       Intervention Provide education and explanation of THRR including how the numbers were predicted and where they are located for reference       Expected Outcomes Short Term: Able to state/look up THRR;Short Term: Able to use daily as guideline for intensity in rehab;Long Term: Able to use THRR to govern intensity when exercising independently       Able to check pulse independently Yes       Intervention Provide education and demonstration on how to check pulse in carotid and radial arteries.;Review the importance of being able to check your own pulse for safety during independent exercise       Expected Outcomes Short Term: Able to explain why pulse checking is important during independent exercise;Long Term: Able to check pulse independently and accurately       Understanding of Exercise Prescription Yes       Intervention Provide education, explanation, and written materials on patient's individual exercise prescription       Expected Outcomes Short Term: Able to explain program exercise prescription;Long Term: Able to explain home exercise prescription to exercise independently

## 2024-02-07 NOTE — Progress Notes (Signed)
 Pulmonary Individual Treatment Plan  Patient Details  Name: Christina Hester MRN: [id number] Date of Birth: [birth year] Referring Provider:   Flowsheet Row Pulmonary Rehab from 02/07/2024 in [facility] Cardiac and Pulmonary Rehab  Referring Provider Parris Manna, MD    Initial Encounter Date:  Flowsheet Row Pulmonary Rehab from 02/07/2024 in [facility] Cardiac and Pulmonary Rehab  Date 02/07/24    Visit Diagnosis: Dyspnea, unspecified type  Chronic obstructive pulmonary disease, unspecified COPD type (HCC)  Patient's Home Medications on Admission:  Current Outpatient Medications:    amLODipine  (NORVASC ) 5 MG tablet, Take 1 tablet (5 mg total) by mouth daily., Disp: 90 tablet, Rfl: 3   azithromycin  (ZITHROMAX ) 250 MG tablet, Take 1 tablet by mouth every Monday, Wednesday, and Friday., Disp: , Rfl:    bisoprolol  (ZEBETA ) 5 MG tablet, Take 1 tablet (5 mg total) by mouth daily., Disp: 90 tablet, Rfl: 3   buPROPion  (WELLBUTRIN ) 100 MG tablet, Take 1 tablet (100 mg total) by mouth daily., Disp: 90 tablet, Rfl: 3   clopidogrel  (PLAVIX ) 75 MG tablet, Take 1 tablet (75 mg total) by mouth daily., Disp: 90 tablet, Rfl: 3   clotrimazole  (MYCELEX ) 10 MG troche, Take 1 tablet (10 mg total) by mouth daily as needed (thrush)., Disp: 70 Troche, Rfl: 0   Ensifentrine  (OHTUVAYRE ) 3 MG/2.5ML SUSP, Inhale 3 mg into the lungs in the morning and at bedtime., Disp: , Rfl:    ipratropium-albuterol  (DUONEB) 0.5-2.5 (3) MG/3ML SOLN, USE 1 VIAL IN NEBULIZER EVERY 4 HOURS, Disp: 120 mL, Rfl: 3   LORazepam  (ATIVAN ) 0.5 MG tablet, TAKE 1/2 TO 1 TABLET BY MOUTH EVERY 8 HOURS AS NEEDED FOR ANXIETY OR SLEEP, Disp: 70 tablet, Rfl: 2   meloxicam  (MOBIC ) 15 MG tablet, Take 1 tablet (15 mg total) by mouth daily. (Patient taking differently: Take 7.5 mg by mouth daily as needed.), Disp: 30 tablet, Rfl: 2   nitroGLYCERIN  (NITROSTAT ) 0.4 MG SL tablet, Place 1 tablet (0.4 mg total) under the tongue every 5 (five) minutes as needed for  chest pain., Disp: 20 tablet, Rfl: 12   nystatin  (MYCOSTATIN ) 100000 UNIT/ML suspension, Take 5 mLs (500,000 Units total) by mouth 4 (four) times daily. Until thrush is resolved. May keep as needed for recurrences., Disp: 473 mL, Rfl: 0   ondansetron  (ZOFRAN ) 4 MG tablet, Take 1 tablet (4 mg total) by mouth every 8 (eight) hours as needed for nausea or vomiting., Disp: 20 tablet, Rfl: 5   pantoprazole  (PROTONIX ) 40 MG tablet, Take 1 tablet (40 mg total) by mouth daily., Disp: 90 tablet, Rfl: 3   predniSONE  (DELTASONE ) 1 MG tablet, Take 3 mg by mouth daily with breakfast., Disp: , Rfl:    rOPINIRole  (REQUIP ) 0.25 MG tablet, Take 1 tablet (0.25 mg total) by mouth at bedtime. (Patient not taking: Reported on 02/05/2024), Disp: 30 tablet, Rfl: 2   Spacer/Aero-Holding Chambers (EASIVENT) inhaler, See admin instructions., Disp: , Rfl:    VENTOLIN  HFA 108 (90 Base) MCG/ACT inhaler, INHALE 2 PUFFS INTO THE LUNGS EVERY 6 HOURS AS NEEDED FOR WHEEZING OR SHORTNESS OF BREATH., Disp: 18 g, Rfl: 3  Past Medical History: Past Medical History:  Diagnosis Date   AAA (abdominal aortic aneurysm) (HCC)    a. 05/2016 Abd U/S: 3.02 x 3.02 AAA.   Anxiety    CAD (coronary artery disease)    a. 02/2017 NSTEMI/Cath: LM nl, LAD mild dzs, D2 min irregs, LCX mild dzs, OM2 50ost, RCA 95p, 182m, L-->R collats, EF 55-65%.   COPD (chronic obstructive  pulmonary disease) (HCC)    Diastolic dysfunction    a. 02/2017 Echo: EF 55-60%, gr1 DD, ? inf HK, mild MR.   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 05/2016 ABI's: R 1.22, L 1.11.   Tobacco abuse     Tobacco Use: Social History   Tobacco Use  Smoking Status Every Day   Current packs/day: 0.00   Types: Cigarettes   Last attempt to quit: 07/12/2017   Years since quitting: 6.5  Smokeless Tobacco Never    Labs: Review  Flowsheet  More data exists      Latest Ref Rng & Units 01/07/2018 02/08/2021 04/04/2022 11/25/2022 03/06/2023  Labs for ITP Cardiac and Pulmonary Rehab  Cholestrol 100 - 199 mg/dL 746  737  734  - 710   LDL (calc) 0 - 99 mg/dL 861  849  854  - 840   HDL-C >39 mg/dL 94  896  893  - 881   Trlycerides 0 - 149 mg/dL 895  56  87  - 75   Bicarbonate 20.0 - 28.0 mmol/L - - - 39.2  -  O2 Saturation % - - - 79.4  -     Pulmonary Assessment Scores:  Pulmonary Assessment Scores     Row Name 02/07/24 1614         ADL UCSD   ADL Phase Entry     SOB Score total 73     Rest 1     Walk 4     Stairs 3     Bath 4     Dress 2     Shop 4       CAT Score   CAT Score 27       mMRC Score   mMRC Score 2        UCSD: Self-administered rating of dyspnea associated with activities of daily living (ADLs) 6-point scale (0 = not at all to 5 = maximal or unable to do because of breathlessness)  Scoring Scores range from 0 to 120.  Minimally important difference is 5 units  CAT: CAT can identify the health impairment of COPD patients and is better correlated with disease progression.  CAT has a scoring range of zero to 40. The CAT score is classified into four groups of low (less than 10), medium (10 - 20), high (21-30) and very high (31-40) based on the impact level of disease on health status. A CAT score over 10 suggests significant symptoms.  A worsening CAT score could be explained by an exacerbation, poor medication adherence, poor inhaler technique, or progression of COPD or comorbid conditions.  CAT MCID is 2 points  mMRC: mMRC (Modified Medical Research Council) Dyspnea Scale is used to assess the degree of baseline functional disability in patients of respiratory disease due to dyspnea. No minimal important difference is established. A decrease in score of 1 point or greater is considered a positive change.   Pulmonary Function Assessment:   Exercise Target Goals: Exercise Program  Goal: Individual exercise prescription set using results from initial 6 min walk test and THRR while considering  patient's activity barriers and safety.   Exercise Prescription Goal: Initial exercise prescription builds to 30-45 minutes a day of aerobic activity, 2-3 days per week.  Home exercise guidelines will  be given to patient during program as part of exercise prescription that the participant will acknowledge.  Education: Aerobic Exercise: - Group verbal and visual presentation on the components of exercise prescription. Introduces F.I.T.T principle from ACSM for exercise prescriptions.  Reviews F.I.T.T. principles of aerobic exercise including progression. Written material given at graduation.   Education: Resistance Exercise: - Group verbal and visual presentation on the components of exercise prescription. Introduces F.I.T.T principle from ACSM for exercise prescriptions  Reviews F.I.T.T. principles of resistance exercise including progression. Written material given at graduation.    Education: Exercise & Equipment Safety: - Individual verbal instruction and demonstration of equipment use and safety with use of the equipment. Flowsheet Row Pulmonary Rehab from 02/07/2024 in [facility] Cardiac and Pulmonary Rehab  Date 02/07/24  Educator MB  Instruction Review Code 1- Verbalizes Understanding    Education: Exercise Physiology & General Exercise Guidelines: - Group verbal and written instruction with models to review the exercise physiology of the cardiovascular system and associated critical values. Provides general exercise guidelines with specific guidelines to those with heart or lung disease.    Education: Flexibility, Balance, Mind/Body Relaxation: - Group verbal and visual presentation with interactive activity on the components of exercise prescription. Introduces F.I.T.T principle from ACSM for exercise prescriptions. Reviews F.I.T.T. principles of flexibility and balance  exercise training including progression. Also discusses the mind body connection.  Reviews various relaxation techniques to help reduce and manage stress (i.e. Deep breathing, progressive muscle relaxation, and visualization). Balance handout provided to take home. Written material given at graduation.   Activity Barriers & Risk Stratification:  Activity Barriers & Cardiac Risk Stratification - 02/07/24 1610       Activity Barriers & Cardiac Risk Stratification   Activity Barriers Back Problems;Other (comment)    Comments possible nerve issue in right leg          6 Minute Walk:  6 Minute Walk     Row Name 02/07/24 1608         6 Minute Walk   Phase Initial     Distance 845 feet     Walk Time 6 minutes     # of Rest Breaks 0     MPH 1.6     METS 1.99     RPE 15     Perceived Dyspnea  1     VO2 Peak 6.95     Symptoms Yes (comment)     Comments Glute muscle pain (5/10)     Resting HR 84 bpm     Resting BP 148/70     Resting Oxygen  Saturation  94 %     Exercise Oxygen  Saturation  during 6 min walk 89 %     Max Ex. HR 88 bpm     Max Ex. BP 176/86     2 Minute Post BP 160/82       Interval HR   1 Minute HR 82     2 Minute HR 85     3 Minute HR 86     4 Minute HR 88     5 Minute HR 86     6 Minute HR 88     2 Minute Post HR 73     Interval Heart Rate? Yes       Interval Oxygen    Interval Oxygen ? Yes     Baseline Oxygen  Saturation % 94 %     1 Minute Oxygen  Saturation % 93 %     1 Minute  Liters of Oxygen  0 L     2 Minute Oxygen  Saturation % 97 %     2 Minute Liters of Oxygen  0 L     3 Minute Oxygen  Saturation % 91 %     3 Minute Liters of Oxygen  0 L     4 Minute Oxygen  Saturation % 89 %     4 Minute Liters of Oxygen  0 L     5 Minute Oxygen  Saturation % 90 %     5 Minute Liters of Oxygen  0 L     6 Minute Oxygen  Saturation % 90 %     6 Minute Liters of Oxygen  0 L     2 Minute Post Oxygen  Saturation % 94 %     2 Minute Post Liters of Oxygen  0 L        Oxygen  Initial Assessment:  Oxygen  Initial Assessment - 02/05/24 1306       Home Oxygen    Home Oxygen  Device E-Tanks;Portable Concentrator    Sleep Oxygen  Prescription Continuous    Liters per minute 2    Home Exercise Oxygen  Prescription Continuous   prn   Liters per minute 2    Home Resting Oxygen  Prescription None    Compliance with Home Oxygen  Use Yes      Intervention   Short Term Goals To learn and exhibit compliance with exercise, home and travel O2 prescription;To learn and understand importance of monitoring SPO2 with pulse oximeter and demonstrate accurate use of the pulse oximeter.;To learn and understand importance of maintaining oxygen  saturations>88%;To learn and demonstrate proper pursed lip breathing techniques or other breathing techniques. ;To learn and demonstrate proper use of respiratory medications    Long  Term Goals Exhibits compliance with exercise, home  and travel O2 prescription;Verbalizes importance of monitoring SPO2 with pulse oximeter and return demonstration;Maintenance of O2 saturations>88%;Exhibits proper breathing techniques, such as pursed lip breathing or other method taught during program session;Compliance with respiratory medication;Demonstrates proper use of MDI's          Oxygen  Re-Evaluation:   Oxygen  Discharge (Final Oxygen  Re-Evaluation):   Initial Exercise Prescription:  Initial Exercise Prescription - 02/07/24 1600       Date of Initial Exercise RX and Referring Provider   Date 02/07/24    Referring Provider Parris Manna, MD      Oxygen    Maintain Oxygen  Saturation 88% or higher      Treadmill   MPH 1.3   Try   Grade 0    Minutes 15    METs 2      Recumbant Bike   Level 1    RPM 50    Watts 15    Minutes 15    METs 1.99      NuStep   Level 1    SPM 80    Minutes 15    METs 1.99      Track   Laps 18    Minutes 15    METs 1.98      Prescription Details   Frequency (times per week) 2    Duration  Progress to 30 minutes of continuous aerobic without signs/symptoms of physical distress      Intensity   THRR 40-80% of Max Heartrate 101-125    Ratings of Perceived Exertion 11-13    Perceived Dyspnea 0-4      Progression   Progression Continue to progress workloads to maintain intensity without signs/symptoms of physical distress.      Resistance  Training   Training Prescription Yes    Weight 2 lb    Reps 10-15          Perform Capillary Blood Glucose checks as needed.  Exercise Prescription Changes:   Exercise Prescription Changes     Row Name 02/07/24 1600             Response to Exercise   Blood Pressure (Admit) 148/70       Blood Pressure (Exercise) 176/86       Blood Pressure (Exit) 160/82       Heart Rate (Admit) 84 bpm       Heart Rate (Exercise) 88 bpm       Heart Rate (Exit) 77 bpm       Oxygen  Saturation (Admit) 94 %       Oxygen  Saturation (Exercise) 89 %       Oxygen  Saturation (Exit) 95 %       Rating of Perceived Exertion (Exercise) 15       Perceived Dyspnea (Exercise) 1       Symptoms Glute muscle pain (5/10)       Comments results       Intensity THRR New         Progression   Average METs 1.99          Exercise Comments:   Exercise Goals and Review:   Exercise Goals     Row Name 02/07/24 1613             Exercise Goals   Increase Physical Activity Yes       Intervention Provide advice, education, support and counseling about physical activity/exercise needs.;Develop an individualized exercise prescription for aerobic and resistive training based on initial evaluation findings, risk stratification, comorbidities and participant's personal goals.       Expected Outcomes Long Term: Add in home exercise to make exercise part of routine and to increase amount of physical activity.;Short Term: Attend rehab on a regular basis to increase amount of physical activity.;Long Term: Exercising regularly at least 3-5 days a week.        Increase Strength and Stamina Yes       Intervention Provide advice, education, support and counseling about physical activity/exercise needs.;Develop an individualized exercise prescription for aerobic and resistive training based on initial evaluation findings, risk stratification, comorbidities and participant's personal goals.       Expected Outcomes Short Term: Increase workloads from initial exercise prescription for resistance, speed, and METs.;Short Term: Perform resistance training exercises routinely during rehab and add in resistance training at home;Long Term: Improve cardiorespiratory fitness, muscular endurance and strength as measured by increased METs and functional capacity ( )       Able to understand and use rate of perceived exertion (RPE) scale Yes       Intervention Provide education and explanation on how to use RPE scale       Expected Outcomes Short Term: Able to use RPE daily in rehab to express subjective intensity level;Long Term:  Able to use RPE to guide intensity level when exercising independently       Able to understand and use Dyspnea scale Yes       Intervention Provide education and explanation on how to use Dyspnea scale       Expected Outcomes Short Term: Able to use Dyspnea scale daily in rehab to express subjective sense of shortness of breath during exertion;Long Term: Able to use Dyspnea scale to guide intensity level  when exercising independently       Knowledge and understanding of Target Heart Rate Range (THRR) Yes       Intervention Provide education and explanation of THRR including how the numbers were predicted and where they are located for reference       Expected Outcomes Short Term: Able to state/look up THRR;Short Term: Able to use daily as guideline for intensity in rehab;Long Term: Able to use THRR to govern intensity when exercising independently       Able to check pulse independently Yes       Intervention Provide education and demonstration  on how to check pulse in carotid and radial arteries.;Review the importance of being able to check your own pulse for safety during independent exercise       Expected Outcomes Short Term: Able to explain why pulse checking is important during independent exercise;Long Term: Able to check pulse independently and accurately       Understanding of Exercise Prescription Yes       Intervention Provide education, explanation, and written materials on patient's individual exercise prescription       Expected Outcomes Short Term: Able to explain program exercise prescription;Long Term: Able to explain home exercise prescription to exercise independently          Exercise Goals Re-Evaluation :   Discharge Exercise Prescription (Final Exercise Prescription Changes):  Exercise Prescription Changes - 02/07/24 1600       Response to Exercise   Blood Pressure (Admit) 148/70    Blood Pressure (Exercise) 176/86    Blood Pressure (Exit) 160/82    Heart Rate (Admit) 84 bpm    Heart Rate (Exercise) 88 bpm    Heart Rate (Exit) 77 bpm    Oxygen  Saturation (Admit) 94 %    Oxygen  Saturation (Exercise) 89 %    Oxygen  Saturation (Exit) 95 %    Rating of Perceived Exertion (Exercise) 15    Perceived Dyspnea (Exercise) 1    Symptoms Glute muscle pain (5/10)    Comments results    Intensity THRR New      Progression   Average METs 1.99          Nutrition:  Target Goals: Understanding of nutrition guidelines, daily intake of sodium 1500mg , cholesterol 200mg , calories 30% from fat and 7% or less from saturated fats, daily to have 5 or more servings of fruits and vegetables.  Education: All About Nutrition: -Group instruction provided by verbal, written material, interactive activities, discussions, models, and posters to present general guidelines for heart healthy nutrition including fat, fiber, MyPlate, the role of sodium in heart healthy nutrition, utilization of the nutrition label, and  utilization of this knowledge for meal planning. Follow up email sent as well. Written material given at graduation. Flowsheet Row Pulmonary Rehab from 02/07/2024 in [facility] Cardiac and Pulmonary Rehab  Education need identified 02/07/24    Biometrics:  Pre Biometrics - 02/07/24 1614       Pre Biometrics   Height 4' 10.6 (1.488 m)    Weight 75 lb 11.2 oz (34.3 kg)    Waist Circumference 26 inches    Hip Circumference 31.2 inches    Waist to Hip Ratio 0.83 %    BMI (Calculated) 15.51    Single Leg Stand 10 seconds           Nutrition Therapy Plan and Nutrition Goals:  Nutrition Therapy & Goals - 02/07/24 1620       Personal Nutrition Goals  Nutrition Goal Wants to wait a few weeks to schedule RD appointment      Intervention Plan   Intervention Prescribe, educate and counsel regarding individualized specific dietary modifications aiming towards targeted core components such as weight, hypertension, lipid management, diabetes, heart failure and other comorbidities.    Expected Outcomes Short Term Goal: Understand basic principles of dietary content, such as calories, fat, sodium, cholesterol and nutrients.          Nutrition Assessments:  MEDIFICTS Score Key: >=70 Need to make dietary changes  40-70 Heart Healthy Diet <= 40 Therapeutic Level Cholesterol Diet  Flowsheet Row Pulmonary Rehab from 02/07/2024 in [facility] Cardiac and Pulmonary Rehab  Picture Your Plate Total Score on Admission 58   Picture Your Plate Scores: <59 Unhealthy dietary pattern with much room for improvement. 41-50 Dietary pattern unlikely to meet recommendations for good health and room for improvement. 51-60 More healthful dietary pattern, with some room for improvement.  >60 Healthy dietary pattern, although there may be some specific behaviors that could be improved.   Nutrition Goals Re-Evaluation:   Nutrition Goals Discharge (Final Nutrition Goals Re-Evaluation):   Psychosocial: Target  Goals: Acknowledge presence or absence of significant depression and/or stress, maximize coping skills, provide positive support system. Participant is able to verbalize types and ability to use techniques and skills needed for reducing stress and depression.   Education: Stress, Anxiety, and Depression - Group verbal and visual presentation to define topics covered.  Reviews how body is impacted by stress, anxiety, and depression.  Also discusses healthy ways to reduce stress and to treat/manage anxiety and depression.  Written material given at graduation.   Education: Sleep Hygiene -Provides group verbal and written instruction about how sleep can affect your health.  Define sleep hygiene, discuss sleep cycles and impact of sleep habits. Review good sleep hygiene tips.    Initial Review & Psychosocial Screening:  Initial Psych Review & Screening - 02/05/24 1315       Initial Review   Current issues with Current Stress Concerns;Current Anxiety/Panic    Source of Stress Concerns Chronic Illness      Family Dynamics   Good Support System? Yes      Barriers   Psychosocial barriers to participate in program There are no identifiable barriers or psychosocial needs.;The patient should benefit from training in stress management and relaxation.      Screening Interventions   Interventions Encouraged to exercise    Expected Outcomes Short Term goal: Utilizing psychosocial counselor, staff and physician to assist with identification of specific Stressors or current issues interfering with healing process. Setting desired goal for each stressor or current issue identified.;Long Term Goal: Stressors or current issues are controlled or eliminated.;Short Term goal: Identification and review with participant of any Quality of Life or Depression concerns found by scoring the questionnaire.;Long Term goal: The participant improves quality of Life and PHQ9 Scores as seen by post scores and/or verbalization  of changes          Quality of Life Scores:  Scores of 19 and below usually indicate a poorer quality of life in these areas.  A difference of  2-3 points is a clinically meaningful difference.  A difference of 2-3 points in the total score of the Quality of Life Index has been associated with significant improvement in overall quality of life, self-image, physical symptoms, and general health in studies assessing change in quality of life.  PHQ-9: Review Flowsheet  More data exists  02/07/2024 01/25/2023 09/04/2022 04/20/2022 01/19/2022  Depression screen PHQ 2/9  Decreased Interest 2 0 0 0 0  Down, Depressed, Hopeless 1 0 0 0 0  PHQ - 2 Score 3 0 0 0 0  Altered sleeping 2 - - - -  Tired, decreased energy 2 - - - -  Change in appetite 1 - - - -  Feeling bad or failure about yourself  1 - - - -  Trouble concentrating 0 - - - -  Moving slowly or fidgety/restless 1 - - - -  Suicidal thoughts 0 - - - -  PHQ-9 Score 10 - - - -  Difficult doing work/chores Very difficult - - - -   Interpretation of Total Score  Total Score Depression Severity:  1-4 = Minimal depression, 5-9 = Mild depression, 10-14 = Moderate depression, 15-19 = Moderately severe depression, 20-27 = Severe depression   Psychosocial Evaluation and Intervention:  Psychosocial Evaluation - 02/05/24 1355       Psychosocial Evaluation & Interventions   Interventions Encouraged to exercise with the program and follow exercise prescription;Stress management education;Relaxation education    Comments Ms. Bruins is coming to pulmonary rehab for dyspna and COPD. She ahs oxygen  to use as needed. She has medication to help with anxiety and she states she thinks a lot of her anxiety comes from her breathing issues. She wants to gain weight and muscle to help with her overall stamina. She has cut back her cigarette intake and wants to quit completely, but mentions she uses it has a reward system so she needs to find another  reward instead of cigarettes. She has a supportive daughter. She is looking forward to attending the program to help with her stamina and education on her health    Expected Outcomes Short: attend pulmonary rehab for education and exercise Long: develop and maintain positive self care habits    Continue Psychosocial Services  Follow up required by staff          Psychosocial Re-Evaluation:   Psychosocial Discharge (Final Psychosocial Re-Evaluation):   Education: Education Goals: Education classes will be provided on a weekly basis, covering required topics. Participant will state understanding/return demonstration of topics presented.  Learning Barriers/Preferences:  Learning Barriers/Preferences - 02/05/24 1315       Learning Barriers/Preferences   Learning Barriers None    Learning Preferences None          General Pulmonary Education Topics:  Infection Prevention: - Provides verbal and written material to individual with discussion of infection control including proper hand washing and proper equipment cleaning during exercise session. Flowsheet Row Pulmonary Rehab from 02/07/2024 in [facility] Cardiac and Pulmonary Rehab  Date 02/07/24  Educator MB  Instruction Review Code 1- Verbalizes Understanding    Falls Prevention: - Provides verbal and written material to individual with discussion of falls prevention and safety. Flowsheet Row Pulmonary Rehab from 02/07/2024 in [facility] Cardiac and Pulmonary Rehab  Date 02/07/24  Educator MB  Instruction Review Code 1- Verbalizes Understanding    Chronic Lung Disease Review: - Group verbal instruction with posters, models, PowerPoint presentations and videos,  to review new updates, new respiratory medications, new advancements in procedures and treatments. Providing information on websites and 800 numbers for continued self-education. Includes information about supplement oxygen , available portable oxygen  systems, continuous and  intermittent flow rates, oxygen  safety, concentrators, and Medicare reimbursement for oxygen . Explanation of Pulmonary Drugs, including class, frequency, complications, importance of spacers, rinsing mouth after steroid MDI's, and proper  cleaning methods for nebulizers. Review of basic lung anatomy and physiology related to function, structure, and complications of lung disease. Review of risk factors. Discussion about methods for diagnosing sleep apnea and types of masks and machines for OSA. Includes a review of the use of types of environmental controls: home humidity, furnaces, filters, dust mite/pet prevention, HEPA vacuums. Discussion about weather changes, air quality and the benefits of nasal washing. Instruction on Warning signs, infection symptoms, calling MD promptly, preventive modes, and value of vaccinations. Review of effective airway clearance, coughing and/or vibration techniques. Emphasizing that all should Create an Action Plan. Written material given at graduation. Flowsheet Row Pulmonary Rehab from 02/07/2024 in [facility] Cardiac and Pulmonary Rehab  Education need identified 02/07/24    AED/CPR: - Group verbal and written instruction with the use of models to demonstrate the basic use of the AED with the basic ABC's of resuscitation.    Anatomy and Cardiac Procedures: - Group verbal and visual presentation and models provide information about basic cardiac anatomy and function. Reviews the testing methods done to diagnose heart disease and the outcomes of the test results. Describes the treatment choices: Medical Management, Angioplasty, or Coronary Bypass Surgery for treating various heart conditions including Myocardial Infarction, Angina, Valve Disease, and Cardiac Arrhythmias.  Written material given at graduation.   Medication Safety: - Group verbal and visual instruction to review commonly prescribed medications for heart and lung disease. Reviews the medication, class of the  drug, and side effects. Includes the steps to properly store meds and maintain the prescription regimen.  Written material given at graduation.   Other: -Provides group and verbal instruction on various topics (see comments)   Knowledge Questionnaire Score:  Knowledge Questionnaire Score - 02/07/24 1621       Knowledge Questionnaire Score   Pre Score 14/18           Core Components/Risk Factors/Patient Goals at Admission:  Personal Goals and Risk Factors at Admission - 02/07/24 1621       Core Components/Risk Factors/Patient Goals on Admission    Weight Management Yes;Weight Gain    Intervention Weight Management: Provide education and appropriate resources to help participant work on and attain dietary goals.;Weight Management: Develop a combined nutrition and exercise program designed to reach desired caloric intake, while maintaining appropriate intake of nutrient and fiber, sodium and fats, and appropriate energy expenditure required for the weight goal.;Weight Management/Obesity: Establish reasonable short term and long term weight goals.    Admit Weight 75 lb 8 oz (34.2 kg)    Goal Weight: Short Term 80 lb (36.3 kg)    Goal Weight: Long Term 90 lb (40.8 kg)    Expected Outcomes Short Term: Continue to assess and modify interventions until short term weight is achieved;Long Term: Adherence to nutrition and physical activity/exercise program aimed toward attainment of established weight goal;Understanding recommendations for meals to include 15-35% energy as protein, 25-35% energy from fat, 35-60% energy from carbohydrates, less than 200mg  of dietary cholesterol, 20-35 gm of total fiber daily;Understanding of distribution of calorie intake throughout the day with the consumption of 4-5 meals/snacks;Weight Gain: Understanding of general recommendations for a high calorie, high protein meal plan that promotes weight gain by distributing calorie intake throughout the day with the  consumption for 4-5 meals, snacks, and/or supplements    Tobacco Cessation Yes    Number of packs per day 0.15 ppd    Intervention Assist the participant in steps to quit. Provide individualized education and counseling about committing  to Tobacco Cessation, relapse prevention, and pharmacological support that can be provided by physician.;Education officer, environmental, assist with locating and accessing local/national Quit Smoking programs, and support quit date choice.    Expected Outcomes Short Term: Will demonstrate readiness to quit, by selecting a quit date.;Short Term: Will quit all tobacco product use, adhering to prevention of relapse plan.;Long Term: Complete abstinence from all tobacco products for at least 12 months from quit date.    Improve shortness of breath with ADL's Yes    Intervention Provide education, individualized exercise plan and daily activity instruction to help decrease symptoms of SOB with activities of daily living.    Expected Outcomes Short Term: Improve cardiorespiratory fitness to achieve a reduction of symptoms when performing ADLs;Long Term: Be able to perform more ADLs without symptoms or delay the onset of symptoms    Hypertension Yes    Intervention Provide education on lifestyle modifcations including regular physical activity/exercise, weight management, moderate sodium restriction and increased consumption of fresh fruit, vegetables, and low fat dairy, alcohol moderation, and smoking cessation.;Monitor prescription use compliance.    Expected Outcomes Short Term: Continued assessment and intervention until BP is < 140/21mm HG in hypertensive participants. < 130/20mm HG in hypertensive participants with diabetes, heart failure or chronic kidney disease.;Long Term: Maintenance of blood pressure at goal levels.          Education:Diabetes - Individual verbal and written instruction to review signs/symptoms of diabetes, desired ranges of glucose level fasting,  after meals and with exercise. Acknowledge that pre and post exercise glucose checks will be done for 3 sessions at entry of program.   Know Your Numbers and Heart Failure: - Group verbal and visual instruction to discuss disease risk factors for cardiac and pulmonary disease and treatment options.  Reviews associated critical values for Overweight/Obesity, Hypertension, Cholesterol, and Diabetes.  Discusses basics of heart failure: signs/symptoms and treatments.  Introduces Heart Failure Zone chart for action plan for heart failure.  Written material given at graduation.   Core Components/Risk Factors/Patient Goals Review:    Core Components/Risk Factors/Patient Goals at Discharge (Final Review):    ITP Comments:  ITP Comments     Row Name 02/05/24 1333 02/05/24 1359 02/07/24 1607       ITP Comments Initial phone call completed. Diagnosis can be found in [facility] 7/8. EP Orientation scheduled for Thursday 7/24 at 2pm.     Janaye is a current tobacco user. Intervention for tobacco cessation was provided at the initial medical review. She was asked about readiness to quit and reported she is willing to quit. Patient was advised and educated about tobacco cessation using combination therapy, tobacco cessation classes, quit line, and quit smoking apps. Patient demonstrated understanding of this material. Staff will continue to provide encouragement and follow up with the patient throughout the program. Patient has a history of a AAA. Parameter checklist faxed to Dr. Bard stating our current parameters given by the program's medical director and asking for any new orders. Completed and gym orientation for respiratory care services. Initial ITP created and sent for review to Dr. Faud Aleskerov, Medical Director. Jack is a current tobacco user. Intervention for tobacco cessation was provided at the initial medical review. She was asked about readiness to quit and reported she is willing to quit. She is  down to 3 cigarettes a day. Patient was advised and educated about tobacco cessation using combination therapy, tobacco cessation classes, quit line, and quit smoking apps. Patient demonstrated understanding of this  material. Staff will continue to provide encouragement and follow up with the patient throughout the program.        Comments: Initial ITP

## 2024-02-13 ENCOUNTER — Ambulatory Visit
Admission: RE | Admit: 2024-02-13 | Discharge: 2024-02-13 | Disposition: A | Discharge status: Home / Self Care | Source: Ambulatory Visit | Attending: Nurse Practitioner | Admitting: Nurse Practitioner

## 2024-02-13 ENCOUNTER — Ambulatory Visit
Admission: RE | Admit: 2024-02-13 | Discharge: 2024-02-13 | Disposition: A | Discharge status: Home / Self Care | Attending: Nurse Practitioner | Admitting: Nurse Practitioner

## 2024-02-13 ENCOUNTER — Encounter: Payer: Self-pay | Admitting: Nurse Practitioner

## 2024-02-13 ENCOUNTER — Encounter

## 2024-02-13 ENCOUNTER — Ambulatory Visit (INDEPENDENT_AMBULATORY_CARE_PROVIDER_SITE_OTHER): Admitting: Nurse Practitioner

## 2024-02-13 VITALS — BP 130/74 | HR 92 | Temp 98.1°F | Resp 16 | Ht 60.0 in | Wt 75.4 lb

## 2024-02-13 DIAGNOSIS — M25552 Pain in left hip: Secondary | ICD-10-CM

## 2024-02-13 DIAGNOSIS — J449 Chronic obstructive pulmonary disease, unspecified: Secondary | ICD-10-CM

## 2024-02-13 DIAGNOSIS — M545 Low back pain, unspecified: Secondary | ICD-10-CM | POA: Insufficient documentation

## 2024-02-13 DIAGNOSIS — S7002XA Contusion of left hip, initial encounter: Secondary | ICD-10-CM | POA: Diagnosis not present

## 2024-02-13 DIAGNOSIS — M25522 Pain in left elbow: Secondary | ICD-10-CM

## 2024-02-13 DIAGNOSIS — S30810A Abrasion of lower back and pelvis, initial encounter: Secondary | ICD-10-CM | POA: Diagnosis not present

## 2024-02-13 DIAGNOSIS — Z043 Encounter for examination and observation following other accident: Secondary | ICD-10-CM | POA: Diagnosis not present

## 2024-02-13 DIAGNOSIS — Z9181 History of falling: Secondary | ICD-10-CM | POA: Insufficient documentation

## 2024-02-13 DIAGNOSIS — M810 Age-related osteoporosis without current pathological fracture: Secondary | ICD-10-CM | POA: Diagnosis not present

## 2024-02-13 DIAGNOSIS — M8580 Other specified disorders of bone density and structure, unspecified site: Secondary | ICD-10-CM | POA: Diagnosis not present

## 2024-02-13 DIAGNOSIS — R0600 Dyspnea, unspecified: Secondary | ICD-10-CM

## 2024-02-13 NOTE — Progress Notes (Signed)
 [facility] [address] [place], [place] [address]  Internal MEDICINE  Office Visit Note  Patient Name: Christina Hester  [id number]  [id number]  Date of Service: 02/13/2024  Chief Complaint  Patient presents with   Acute Visit    Clemens- bruised all over. Back/hip hurts -Left      HPI Sherlin presents for an acute sick visit for recent fall and injury to left hip --fell last night  Bruised low back, left hip and left elbow, concern for possible fracture.  She has severe osteoporosis of the hip, spine and forearm according to her dexa scan last year in may.       Current Medication:  Outpatient Encounter Medications as of 02/13/2024  Medication Sig   amLODipine  (NORVASC ) 5 MG tablet Take 1 tablet (5 mg total) by mouth daily.   azithromycin  (ZITHROMAX ) 250 MG tablet Take 1 tablet by mouth every Monday, Wednesday, and Friday.   bisoprolol  (ZEBETA ) 5 MG tablet Take 1 tablet (5 mg total) by mouth daily.   buPROPion  (WELLBUTRIN ) 100 MG tablet Take 1 tablet (100 mg total) by mouth daily.   clopidogrel  (PLAVIX ) 75 MG tablet Take 1 tablet (75 mg total) by mouth daily.   clotrimazole  (MYCELEX ) 10 MG troche Take 1 tablet (10 mg total) by mouth daily as needed (thrush).   Ensifentrine  (OHTUVAYRE ) 3 MG/2.5ML SUSP Inhale 3 mg into the lungs in the morning and at bedtime.   ipratropium-albuterol  (DUONEB) 0.5-2.5 (3) MG/3ML SOLN USE 1 VIAL IN NEBULIZER EVERY 4 HOURS   LORazepam  (ATIVAN ) 0.5 MG tablet TAKE 1/2 TO 1 TABLET BY MOUTH EVERY 8 HOURS AS NEEDED FOR ANXIETY OR SLEEP   meloxicam  (MOBIC ) 15 MG tablet Take 1 tablet (15 mg total) by mouth daily. (Patient taking differently: Take 7.5 mg by mouth daily as needed.)   nitroGLYCERIN  (NITROSTAT ) 0.4 MG SL tablet Place 1 tablet (0.4 mg total) under the tongue every 5 (five) minutes as needed for chest pain.   nystatin  (MYCOSTATIN ) 100000 UNIT/ML suspension Take 5 mLs (500,000 Units total) by mouth 4 (four) times daily. Until thrush is  resolved. May keep as needed for recurrences.   ondansetron  (ZOFRAN ) 4 MG tablet Take 1 tablet (4 mg total) by mouth every 8 (eight) hours as needed for nausea or vomiting.   pantoprazole  (PROTONIX ) 40 MG tablet Take 1 tablet (40 mg total) by mouth daily.   predniSONE  (DELTASONE ) 1 MG tablet Take 3 mg by mouth daily with breakfast.   rOPINIRole  (REQUIP ) 0.25 MG tablet Take 1 tablet (0.25 mg total) by mouth at bedtime. (Patient not taking: Reported on 02/05/2024)   Spacer/Aero-Holding Chambers (EASIVENT) inhaler See admin instructions.   VENTOLIN  HFA 108 (90 Base) MCG/ACT inhaler INHALE 2 PUFFS INTO THE LUNGS EVERY 6 HOURS AS NEEDED FOR WHEEZING OR SHORTNESS OF BREATH.   No facility-administered encounter medications on file as of 02/13/2024.      Medical History: Past Medical History:  Diagnosis Date   AAA (abdominal aortic aneurysm) (HCC)    a. 05/2016 Abd U/S: 3.02 x 3.02 AAA.   Anxiety    CAD (coronary artery disease)    a. 02/2017 NSTEMI/Cath: LM nl, LAD mild dzs, D2 min irregs, LCX mild dzs, OM2 50ost, RCA 95p, 183m, L-->R collats, EF 55-65%.   COPD (chronic obstructive pulmonary disease) (HCC)    Diastolic dysfunction    a. 02/2017 Echo: EF 55-60%, gr1 DD, ? inf HK, mild MR.   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 05/2016 ABI's: R 1.22, L 1.11.   Tobacco abuse      Vital Signs: BP 130/74   Pulse 92   Temp 98.1 F (36.7 C)   Resp 16   Ht 5' (1.524 m)   Wt 75 lb 6.4 oz (34.2 kg)   SpO2 93%   BMI 14.73 kg/m    Review of Systems  Constitutional:  Positive for fatigue.  HENT: Negative.    Respiratory:  Positive for cough and shortness of breath. Negative for chest tightness and wheezing.        Intermittent symptoms due to COPD  Cardiovascular: Negative.  Negative for chest pain and palpitations.  Gastrointestinal:  Negative.  Negative for abdominal pain.  Musculoskeletal:  Positive for arthralgias, back pain, gait problem and myalgias.  Neurological:  Negative for headaches.  Hematological:  Negative for adenopathy. Bruises/bleeds easily (bruising to left elbow, left hip and left lower back).  Psychiatric/Behavioral:  Positive for sleep disturbance. Negative for self-injury and suicidal ideas. The patient is nervous/anxious.     Physical Exam Vitals reviewed.  Constitutional:      General: She is not in acute distress.    Appearance: Normal appearance. She is not ill-appearing or diaphoretic.  HENT:     Head: Normocephalic and atraumatic.  Eyes:     Pupils: Pupils are equal, round, and reactive to light.  Cardiovascular:     Rate and Rhythm: Normal rate and regular rhythm.  Pulmonary:     Effort: Pulmonary effort is normal. No respiratory distress.  Skin:    General: Skin is warm and dry.     Capillary Refill: Capillary refill takes less than 2 seconds.     Findings: Bruising (left elbow, left hip, and left lower back) present.  Neurological:     Mental Status: She is alert and oriented to person, place, and time.  Psychiatric:        Mood and Affect: Mood normal.        Behavior: Behavior normal.       Assessment/Plan: 1. Acute hip pain, left (Primary) Xray ordered to rule out fracture - DG Hip Unilat W OR W/O Pelvis 2-3 Views Left; Future  2. Acute left-sided low back pain without sciatica Xray ordered to rule out fracture - DG Lumbar Spine Complete; Future  3. Left elbow pain Xray ordered to rule out fracture - DG Elbow Complete Left; Future  4. Osteoporosis of multiple sites Xray ordered to rule out fracture - DG Elbow Complete Left; Future - DG Lumbar Spine Complete; Future - DG Hip Unilat W OR W/O Pelvis 2-3 Views Left; Future  5. History of recent fall Xray ordered to rule out fracture - DG Elbow Complete Left; Future - DG Lumbar Spine Complete; Future - DG Hip  Unilat W OR W/O Pelvis 2-3 Views Left; Future   General Counseling: Joelynn verbalizes understanding of the findings of todays visit and agrees with plan of treatment. I have discussed any further diagnostic evaluation that may be needed or ordered today. We also reviewed her medications today. she has been encouraged to call the office with any questions or concerns that should arise related to todays visit.    Counseling:    Orders Placed This Encounter  Procedures   DG Elbow Complete Left   DG HIP UNILAT W  OR W/O PELVIS MIN 4 VIEWS LEFT   DG Lumbar Spine Complete    No orders of the defined types were placed in this encounter.   Return if symptoms worsen or fail to improve, for will call patient with xray results, and keep regular sccheduled next appt. .  [place] Controlled Substance Database was reviewed by me for overdose risk score (ORS)  Time spent:30 Minutes Time spent with patient included reviewing progress notes, labs, imaging studies, and discussing plan for follow up.   This patient was seen by Mardy Maxin, FNP-C in collaboration with Dr. Sigrid Bathe as a part of collaborative care agreement.  Mikiah Demond R. Maxin, MSN, FNP-C Internal Medicine

## 2024-02-13 NOTE — Progress Notes (Signed)
 Pulmonary Individual Treatment Plan  Patient Details  Name: Christina Hester MRN: [id number] Date of Birth: 06/21/1941 Referring Provider:   Flowsheet Row Pulmonary Rehab from 02/07/2024 in [facility] Cardiac and Pulmonary Rehab  Referring Provider Parris Manna, MD    Initial Encounter Date:  Flowsheet Row Pulmonary Rehab from 02/07/2024 in [facility] Cardiac and Pulmonary Rehab  Date 02/07/24    Visit Diagnosis: Dyspnea, unspecified type  Chronic obstructive pulmonary disease, unspecified COPD type (HCC)  Patient's Home Medications on Admission:  Current Outpatient Medications:    amLODipine  (NORVASC ) 5 MG tablet, Take 1 tablet (5 mg total) by mouth daily., Disp: 90 tablet, Rfl: 3   azithromycin  (ZITHROMAX ) 250 MG tablet, Take 1 tablet by mouth every Monday, Wednesday, and Friday., Disp: , Rfl:    bisoprolol  (ZEBETA ) 5 MG tablet, Take 1 tablet (5 mg total) by mouth daily., Disp: 90 tablet, Rfl: 3   buPROPion  (WELLBUTRIN ) 100 MG tablet, Take 1 tablet (100 mg total) by mouth daily., Disp: 90 tablet, Rfl: 3   clopidogrel  (PLAVIX ) 75 MG tablet, Take 1 tablet (75 mg total) by mouth daily., Disp: 90 tablet, Rfl: 3   clotrimazole  (MYCELEX ) 10 MG troche, Take 1 tablet (10 mg total) by mouth daily as needed (thrush)., Disp: 70 Troche, Rfl: 0   Ensifentrine  (OHTUVAYRE ) 3 MG/2.5ML SUSP, Inhale 3 mg into the lungs in the morning and at bedtime., Disp: , Rfl:    ipratropium-albuterol  (DUONEB) 0.5-2.5 (3) MG/3ML SOLN, USE 1 VIAL IN NEBULIZER EVERY 4 HOURS, Disp: 120 mL, Rfl: 3   LORazepam  (ATIVAN ) 0.5 MG tablet, TAKE 1/2 TO 1 TABLET BY MOUTH EVERY 8 HOURS AS NEEDED FOR ANXIETY OR SLEEP, Disp: 70 tablet, Rfl: 2   meloxicam  (MOBIC ) 15 MG tablet, Take 1 tablet (15 mg total) by mouth daily. (Patient taking differently: Take 7.5 mg by mouth daily as needed.), Disp: 30 tablet, Rfl: 2   nitroGLYCERIN  (NITROSTAT ) 0.4 MG SL tablet, Place 1 tablet (0.4 mg total) under the tongue every 5 (five) minutes as needed for  chest pain., Disp: 20 tablet, Rfl: 12   nystatin  (MYCOSTATIN ) 100000 UNIT/ML suspension, Take 5 mLs (500,000 Units total) by mouth 4 (four) times daily. Until thrush is resolved. May keep as needed for recurrences., Disp: 473 mL, Rfl: 0   ondansetron  (ZOFRAN ) 4 MG tablet, Take 1 tablet (4 mg total) by mouth every 8 (eight) hours as needed for nausea or vomiting., Disp: 20 tablet, Rfl: 5   pantoprazole  (PROTONIX ) 40 MG tablet, Take 1 tablet (40 mg total) by mouth daily., Disp: 90 tablet, Rfl: 3   predniSONE  (DELTASONE ) 1 MG tablet, Take 3 mg by mouth daily with breakfast., Disp: , Rfl:    rOPINIRole  (REQUIP ) 0.25 MG tablet, Take 1 tablet (0.25 mg total) by mouth at bedtime. (Patient not taking: Reported on 02/05/2024), Disp: 30 tablet, Rfl: 2   Spacer/Aero-Holding Chambers (EASIVENT) inhaler, See admin instructions., Disp: , Rfl:    VENTOLIN  HFA 108 (90 Base) MCG/ACT inhaler, INHALE 2 PUFFS INTO THE LUNGS EVERY 6 HOURS AS NEEDED FOR WHEEZING OR SHORTNESS OF BREATH., Disp: 18 g, Rfl: 3  Past Medical History: Past Medical History:  Diagnosis Date   AAA (abdominal aortic aneurysm) (HCC)    a. 05/2016 Abd U/S: 3.02 x 3.02 AAA.   Anxiety    CAD (coronary artery disease)    a. 02/2017 NSTEMI/Cath: LM nl, LAD mild dzs, D2 min irregs, LCX mild dzs, OM2 50ost, RCA 95p, 149m, L-->R collats, EF 55-65%.   COPD (chronic obstructive  pulmonary disease) (HCC)    Diastolic dysfunction    a. 02/2017 Echo: EF 55-60%, gr1 DD, ? inf HK, mild MR.   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 05/2016 ABI's: R 1.22, L 1.11.   Tobacco abuse     Tobacco Use: Social History   Tobacco Use  Smoking Status Every Day   Current packs/day: 0.00   Types: Cigarettes   Last attempt to quit: 07/12/2017   Years since quitting: 6.5  Smokeless Tobacco Never    Labs: Review  Flowsheet  More data exists      Latest Ref Rng & Units 01/07/2018 02/08/2021 04/04/2022 11/25/2022 03/06/2023  Labs for ITP Cardiac and Pulmonary Rehab  Cholestrol 100 - 199 mg/dL 746  737  734  - 710   LDL (calc) 0 - 99 mg/dL 861  849  854  - 840   HDL-C >39 mg/dL 94  896  893  - 881   Trlycerides 0 - 149 mg/dL 895  56  87  - 75   Bicarbonate 20.0 - 28.0 mmol/L - - - 39.2  -  O2 Saturation % - - - 79.4  -     Pulmonary Assessment Scores:  Pulmonary Assessment Scores     Row Name 02/07/24 1614         ADL UCSD   ADL Phase Entry     SOB Score total 73     Rest 1     Walk 4     Stairs 3     Bath 4     Dress 2     Shop 4       CAT Score   CAT Score 27       mMRC Score   mMRC Score 2        UCSD: Self-administered rating of dyspnea associated with activities of daily living (ADLs) 6-point scale (0 = not at all to 5 = maximal or unable to do because of breathlessness)  Scoring Scores range from 0 to 120.  Minimally important difference is 5 units  CAT: CAT can identify the health impairment of COPD patients and is better correlated with disease progression.  CAT has a scoring range of zero to 40. The CAT score is classified into four groups of low (less than 10), medium (10 - 20), high (21-30) and very high (31-40) based on the impact level of disease on health status. A CAT score over 10 suggests significant symptoms.  A worsening CAT score could be explained by an exacerbation, poor medication adherence, poor inhaler technique, or progression of COPD or comorbid conditions.  CAT MCID is 2 points  mMRC: mMRC (Modified Medical Research Council) Dyspnea Scale is used to assess the degree of baseline functional disability in patients of respiratory disease due to dyspnea. No minimal important difference is established. A decrease in score of 1 point or greater is considered a positive change.   Pulmonary Function Assessment:   Exercise Target Goals: Exercise Program  Goal: Individual exercise prescription set using results from initial 6 min walk test and THRR while considering  patient's activity barriers and safety.   Exercise Prescription Goal: Initial exercise prescription builds to 30-45 minutes a day of aerobic activity, 2-3 days per week.  Home exercise guidelines will  be given to patient during program as part of exercise prescription that the participant will acknowledge.  Education: Aerobic Exercise: - Group verbal and visual presentation on the components of exercise prescription. Introduces F.I.T.T principle from ACSM for exercise prescriptions.  Reviews F.I.T.T. principles of aerobic exercise including progression. Written material given at graduation.   Education: Resistance Exercise: - Group verbal and visual presentation on the components of exercise prescription. Introduces F.I.T.T principle from ACSM for exercise prescriptions  Reviews F.I.T.T. principles of resistance exercise including progression. Written material given at graduation.    Education: Exercise & Equipment Safety: - Individual verbal instruction and demonstration of equipment use and safety with use of the equipment. Flowsheet Row Pulmonary Rehab from 02/07/2024 in [facility] Cardiac and Pulmonary Rehab  Date 02/07/24  Educator MB  Instruction Review Code 1- Verbalizes Understanding    Education: Exercise Physiology & General Exercise Guidelines: - Group verbal and written instruction with models to review the exercise physiology of the cardiovascular system and associated critical values. Provides general exercise guidelines with specific guidelines to those with heart or lung disease.    Education: Flexibility, Balance, Mind/Body Relaxation: - Group verbal and visual presentation with interactive activity on the components of exercise prescription. Introduces F.I.T.T principle from ACSM for exercise prescriptions. Reviews F.I.T.T. principles of flexibility and balance  exercise training including progression. Also discusses the mind body connection.  Reviews various relaxation techniques to help reduce and manage stress (i.e. Deep breathing, progressive muscle relaxation, and visualization). Balance handout provided to take home. Written material given at graduation.   Activity Barriers & Risk Stratification:  Activity Barriers & Cardiac Risk Stratification - 02/07/24 1610       Activity Barriers & Cardiac Risk Stratification   Activity Barriers Back Problems;Other (comment)    Comments possible nerve issue in right leg          6 Minute Walk:  6 Minute Walk     Row Name 02/07/24 1608         6 Minute Walk   Phase Initial     Distance 845 feet     Walk Time 6 minutes     # of Rest Breaks 0     MPH 1.6     METS 1.99     RPE 15     Perceived Dyspnea  1     VO2 Peak 6.95     Symptoms Yes (comment)     Comments Glute muscle pain (5/10)     Resting HR 84 bpm     Resting BP 148/70     Resting Oxygen  Saturation  94 %     Exercise Oxygen  Saturation  during 6 min walk 89 %     Max Ex. HR 88 bpm     Max Ex. BP 176/86     2 Minute Post BP 160/82       Interval HR   1 Minute HR 82     2 Minute HR 85     3 Minute HR 86     4 Minute HR 88     5 Minute HR 86     6 Minute HR 88     2 Minute Post HR 73     Interval Heart Rate? Yes       Interval Oxygen    Interval Oxygen ? Yes     Baseline Oxygen  Saturation % 94 %     1 Minute Oxygen  Saturation % 93 %     1 Minute  Liters of Oxygen  0 L     2 Minute Oxygen  Saturation % 97 %     2 Minute Liters of Oxygen  0 L     3 Minute Oxygen  Saturation % 91 %     3 Minute Liters of Oxygen  0 L     4 Minute Oxygen  Saturation % 89 %     4 Minute Liters of Oxygen  0 L     5 Minute Oxygen  Saturation % 90 %     5 Minute Liters of Oxygen  0 L     6 Minute Oxygen  Saturation % 90 %     6 Minute Liters of Oxygen  0 L     2 Minute Post Oxygen  Saturation % 94 %     2 Minute Post Liters of Oxygen  0 L        Oxygen  Initial Assessment:  Oxygen  Initial Assessment - 02/05/24 1306       Home Oxygen    Home Oxygen  Device E-Tanks;Portable Concentrator    Sleep Oxygen  Prescription Continuous    Liters per minute 2    Home Exercise Oxygen  Prescription Continuous   prn   Liters per minute 2    Home Resting Oxygen  Prescription None    Compliance with Home Oxygen  Use Yes      Intervention   Short Term Goals To learn and exhibit compliance with exercise, home and travel O2 prescription;To learn and understand importance of monitoring SPO2 with pulse oximeter and demonstrate accurate use of the pulse oximeter.;To learn and understand importance of maintaining oxygen  saturations>88%;To learn and demonstrate proper pursed lip breathing techniques or other breathing techniques. ;To learn and demonstrate proper use of respiratory medications    Long  Term Goals Exhibits compliance with exercise, home  and travel O2 prescription;Verbalizes importance of monitoring SPO2 with pulse oximeter and return demonstration;Maintenance of O2 saturations>88%;Exhibits proper breathing techniques, such as pursed lip breathing or other method taught during program session;Compliance with respiratory medication;Demonstrates proper use of MDI's          Oxygen  Re-Evaluation:   Oxygen  Discharge (Final Oxygen  Re-Evaluation):   Initial Exercise Prescription:  Initial Exercise Prescription - 02/07/24 1600       Date of Initial Exercise RX and Referring Provider   Date 02/07/24    Referring Provider Parris Manna, MD      Oxygen    Maintain Oxygen  Saturation 88% or higher      Treadmill   MPH 1.3   Try   Grade 0    Minutes 15    METs 2      Recumbant Bike   Level 1    RPM 50    Watts 15    Minutes 15    METs 1.99      NuStep   Level 1    SPM 80    Minutes 15    METs 1.99      Track   Laps 18    Minutes 15    METs 1.98      Prescription Details   Frequency (times per week) 2    Duration  Progress to 30 minutes of continuous aerobic without signs/symptoms of physical distress      Intensity   THRR 40-80% of Max Heartrate 101-125    Ratings of Perceived Exertion 11-13    Perceived Dyspnea 0-4      Progression   Progression Continue to progress workloads to maintain intensity without signs/symptoms of physical distress.      Resistance  Training   Training Prescription Yes    Weight 2 lb    Reps 10-15          Perform Capillary Blood Glucose checks as needed.  Exercise Prescription Changes:   Exercise Prescription Changes     Row Name 02/07/24 1600             Response to Exercise   Blood Pressure (Admit) 148/70       Blood Pressure (Exercise) 176/86       Blood Pressure (Exit) 160/82       Heart Rate (Admit) 84 bpm       Heart Rate (Exercise) 88 bpm       Heart Rate (Exit) 77 bpm       Oxygen  Saturation (Admit) 94 %       Oxygen  Saturation (Exercise) 89 %       Oxygen  Saturation (Exit) 95 %       Rating of Perceived Exertion (Exercise) 15       Perceived Dyspnea (Exercise) 1       Symptoms Glute muscle pain (5/10)       Comments results       Intensity THRR New         Progression   Average METs 1.99          Exercise Comments:   Exercise Goals and Review:   Exercise Goals     Row Name 02/07/24 1613             Exercise Goals   Increase Physical Activity Yes       Intervention Provide advice, education, support and counseling about physical activity/exercise needs.;Develop an individualized exercise prescription for aerobic and resistive training based on initial evaluation findings, risk stratification, comorbidities and participant's personal goals.       Expected Outcomes Long Term: Add in home exercise to make exercise part of routine and to increase amount of physical activity.;Short Term: Attend rehab on a regular basis to increase amount of physical activity.;Long Term: Exercising regularly at least 3-5 days a week.        Increase Strength and Stamina Yes       Intervention Provide advice, education, support and counseling about physical activity/exercise needs.;Develop an individualized exercise prescription for aerobic and resistive training based on initial evaluation findings, risk stratification, comorbidities and participant's personal goals.       Expected Outcomes Short Term: Increase workloads from initial exercise prescription for resistance, speed, and METs.;Short Term: Perform resistance training exercises routinely during rehab and add in resistance training at home;Long Term: Improve cardiorespiratory fitness, muscular endurance and strength as measured by increased METs and functional capacity ( )       Able to understand and use rate of perceived exertion (RPE) scale Yes       Intervention Provide education and explanation on how to use RPE scale       Expected Outcomes Short Term: Able to use RPE daily in rehab to express subjective intensity level;Long Term:  Able to use RPE to guide intensity level when exercising independently       Able to understand and use Dyspnea scale Yes       Intervention Provide education and explanation on how to use Dyspnea scale       Expected Outcomes Short Term: Able to use Dyspnea scale daily in rehab to express subjective sense of shortness of breath during exertion;Long Term: Able to use Dyspnea scale to guide intensity level  when exercising independently       Knowledge and understanding of Target Heart Rate Range (THRR) Yes       Intervention Provide education and explanation of THRR including how the numbers were predicted and where they are located for reference       Expected Outcomes Short Term: Able to state/look up THRR;Short Term: Able to use daily as guideline for intensity in rehab;Long Term: Able to use THRR to govern intensity when exercising independently       Able to check pulse independently Yes       Intervention Provide education and demonstration  on how to check pulse in carotid and radial arteries.;Review the importance of being able to check your own pulse for safety during independent exercise       Expected Outcomes Short Term: Able to explain why pulse checking is important during independent exercise;Long Term: Able to check pulse independently and accurately       Understanding of Exercise Prescription Yes       Intervention Provide education, explanation, and written materials on patient's individual exercise prescription       Expected Outcomes Short Term: Able to explain program exercise prescription;Long Term: Able to explain home exercise prescription to exercise independently          Exercise Goals Re-Evaluation :   Discharge Exercise Prescription (Final Exercise Prescription Changes):  Exercise Prescription Changes - 02/07/24 1600       Response to Exercise   Blood Pressure (Admit) 148/70    Blood Pressure (Exercise) 176/86    Blood Pressure (Exit) 160/82    Heart Rate (Admit) 84 bpm    Heart Rate (Exercise) 88 bpm    Heart Rate (Exit) 77 bpm    Oxygen  Saturation (Admit) 94 %    Oxygen  Saturation (Exercise) 89 %    Oxygen  Saturation (Exit) 95 %    Rating of Perceived Exertion (Exercise) 15    Perceived Dyspnea (Exercise) 1    Symptoms Glute muscle pain (5/10)    Comments results    Intensity THRR New      Progression   Average METs 1.99          Nutrition:  Target Goals: Understanding of nutrition guidelines, daily intake of sodium 1500mg , cholesterol 200mg , calories 30% from fat and 7% or less from saturated fats, daily to have 5 or more servings of fruits and vegetables.  Education: All About Nutrition: -Group instruction provided by verbal, written material, interactive activities, discussions, models, and posters to present general guidelines for heart healthy nutrition including fat, fiber, MyPlate, the role of sodium in heart healthy nutrition, utilization of the nutrition label, and  utilization of this knowledge for meal planning. Follow up email sent as well. Written material given at graduation. Flowsheet Row Pulmonary Rehab from 02/07/2024 in [facility] Cardiac and Pulmonary Rehab  Education need identified 02/07/24    Biometrics:  Pre Biometrics - 02/07/24 1614       Pre Biometrics   Height 4' 10.6 (1.488 m)    Weight 75 lb 11.2 oz (34.3 kg)    Waist Circumference 26 inches    Hip Circumference 31.2 inches    Waist to Hip Ratio 0.83 %    BMI (Calculated) 15.51    Single Leg Stand 10 seconds           Nutrition Therapy Plan and Nutrition Goals:  Nutrition Therapy & Goals - 02/07/24 1620       Personal Nutrition Goals  Nutrition Goal Wants to wait a few weeks to schedule RD appointment      Intervention Plan   Intervention Prescribe, educate and counsel regarding individualized specific dietary modifications aiming towards targeted core components such as weight, hypertension, lipid management, diabetes, heart failure and other comorbidities.    Expected Outcomes Short Term Goal: Understand basic principles of dietary content, such as calories, fat, sodium, cholesterol and nutrients.          Nutrition Assessments:  MEDIFICTS Score Key: >=70 Need to make dietary changes  40-70 Heart Healthy Diet <= 40 Therapeutic Level Cholesterol Diet  Flowsheet Row Pulmonary Rehab from 02/07/2024 in [facility] Cardiac and Pulmonary Rehab  Picture Your Plate Total Score on Admission 58   Picture Your Plate Scores: <59 Unhealthy dietary pattern with much room for improvement. 41-50 Dietary pattern unlikely to meet recommendations for good health and room for improvement. 51-60 More healthful dietary pattern, with some room for improvement.  >60 Healthy dietary pattern, although there may be some specific behaviors that could be improved.   Nutrition Goals Re-Evaluation:   Nutrition Goals Discharge (Final Nutrition Goals Re-Evaluation):   Psychosocial: Target  Goals: Acknowledge presence or absence of significant depression and/or stress, maximize coping skills, provide positive support system. Participant is able to verbalize types and ability to use techniques and skills needed for reducing stress and depression.   Education: Stress, Anxiety, and Depression - Group verbal and visual presentation to define topics covered.  Reviews how body is impacted by stress, anxiety, and depression.  Also discusses healthy ways to reduce stress and to treat/manage anxiety and depression.  Written material given at graduation.   Education: Sleep Hygiene -Provides group verbal and written instruction about how sleep can affect your health.  Define sleep hygiene, discuss sleep cycles and impact of sleep habits. Review good sleep hygiene tips.    Initial Review & Psychosocial Screening:  Initial Psych Review & Screening - 02/05/24 1315       Initial Review   Current issues with Current Stress Concerns;Current Anxiety/Panic    Source of Stress Concerns Chronic Illness      Family Dynamics   Good Support System? Yes      Barriers   Psychosocial barriers to participate in program There are no identifiable barriers or psychosocial needs.;The patient should benefit from training in stress management and relaxation.      Screening Interventions   Interventions Encouraged to exercise    Expected Outcomes Short Term goal: Utilizing psychosocial counselor, staff and physician to assist with identification of specific Stressors or current issues interfering with healing process. Setting desired goal for each stressor or current issue identified.;Long Term Goal: Stressors or current issues are controlled or eliminated.;Short Term goal: Identification and review with participant of any Quality of Life or Depression concerns found by scoring the questionnaire.;Long Term goal: The participant improves quality of Life and PHQ9 Scores as seen by post scores and/or verbalization  of changes          Quality of Life Scores:  Scores of 19 and below usually indicate a poorer quality of life in these areas.  A difference of  2-3 points is a clinically meaningful difference.  A difference of 2-3 points in the total score of the Quality of Life Index has been associated with significant improvement in overall quality of life, self-image, physical symptoms, and general health in studies assessing change in quality of life.  PHQ-9: Review Flowsheet  More data exists  02/07/2024 01/25/2023 09/04/2022 04/20/2022 01/19/2022  Depression screen PHQ 2/9  Decreased Interest 2 0 0 0 0  Down, Depressed, Hopeless 1 0 0 0 0  PHQ - 2 Score 3 0 0 0 0  Altered sleeping 2 - - - -  Tired, decreased energy 2 - - - -  Change in appetite 1 - - - -  Feeling bad or failure about yourself  1 - - - -  Trouble concentrating 0 - - - -  Moving slowly or fidgety/restless 1 - - - -  Suicidal thoughts 0 - - - -  PHQ-9 Score 10 - - - -  Difficult doing work/chores Very difficult - - - -   Interpretation of Total Score  Total Score Depression Severity:  1-4 = Minimal depression, 5-9 = Mild depression, 10-14 = Moderate depression, 15-19 = Moderately severe depression, 20-27 = Severe depression   Psychosocial Evaluation and Intervention:  Psychosocial Evaluation - 02/05/24 1355       Psychosocial Evaluation & Interventions   Interventions Encouraged to exercise with the program and follow exercise prescription;Stress management education;Relaxation education    Comments Ms. Matura is coming to pulmonary rehab for dyspna and COPD. She ahs oxygen  to use as needed. She has medication to help with anxiety and she states she thinks a lot of her anxiety comes from her breathing issues. She wants to gain weight and muscle to help with her overall stamina. She has cut back her cigarette intake and wants to quit completely, but mentions she uses it has a reward system so she needs to find another  reward instead of cigarettes. She has a supportive daughter. She is looking forward to attending the program to help with her stamina and education on her health    Expected Outcomes Short: attend pulmonary rehab for education and exercise Long: develop and maintain positive self care habits    Continue Psychosocial Services  Follow up required by staff          Psychosocial Re-Evaluation:   Psychosocial Discharge (Final Psychosocial Re-Evaluation):   Education: Education Goals: Education classes will be provided on a weekly basis, covering required topics. Participant will state understanding/return demonstration of topics presented.  Learning Barriers/Preferences:  Learning Barriers/Preferences - 02/05/24 1315       Learning Barriers/Preferences   Learning Barriers None    Learning Preferences None          General Pulmonary Education Topics:  Infection Prevention: - Provides verbal and written material to individual with discussion of infection control including proper hand washing and proper equipment cleaning during exercise session. Flowsheet Row Pulmonary Rehab from 02/07/2024 in [facility] Cardiac and Pulmonary Rehab  Date 02/07/24  Educator MB  Instruction Review Code 1- Verbalizes Understanding    Falls Prevention: - Provides verbal and written material to individual with discussion of falls prevention and safety. Flowsheet Row Pulmonary Rehab from 02/07/2024 in [facility] Cardiac and Pulmonary Rehab  Date 02/07/24  Educator MB  Instruction Review Code 1- Verbalizes Understanding    Chronic Lung Disease Review: - Group verbal instruction with posters, models, PowerPoint presentations and videos,  to review new updates, new respiratory medications, new advancements in procedures and treatments. Providing information on websites and 800 numbers for continued self-education. Includes information about supplement oxygen , available portable oxygen  systems, continuous and  intermittent flow rates, oxygen  safety, concentrators, and Medicare reimbursement for oxygen . Explanation of Pulmonary Drugs, including class, frequency, complications, importance of spacers, rinsing mouth after steroid MDI's, and proper  cleaning methods for nebulizers. Review of basic lung anatomy and physiology related to function, structure, and complications of lung disease. Review of risk factors. Discussion about methods for diagnosing sleep apnea and types of masks and machines for OSA. Includes a review of the use of types of environmental controls: home humidity, furnaces, filters, dust mite/pet prevention, HEPA vacuums. Discussion about weather changes, air quality and the benefits of nasal washing. Instruction on Warning signs, infection symptoms, calling MD promptly, preventive modes, and value of vaccinations. Review of effective airway clearance, coughing and/or vibration techniques. Emphasizing that all should Create an Action Plan. Written material given at graduation. Flowsheet Row Pulmonary Rehab from 02/07/2024 in [facility] Cardiac and Pulmonary Rehab  Education need identified 02/07/24    AED/CPR: - Group verbal and written instruction with the use of models to demonstrate the basic use of the AED with the basic ABC's of resuscitation.    Anatomy and Cardiac Procedures: - Group verbal and visual presentation and models provide information about basic cardiac anatomy and function. Reviews the testing methods done to diagnose heart disease and the outcomes of the test results. Describes the treatment choices: Medical Management, Angioplasty, or Coronary Bypass Surgery for treating various heart conditions including Myocardial Infarction, Angina, Valve Disease, and Cardiac Arrhythmias.  Written material given at graduation.   Medication Safety: - Group verbal and visual instruction to review commonly prescribed medications for heart and lung disease. Reviews the medication, class of the  drug, and side effects. Includes the steps to properly store meds and maintain the prescription regimen.  Written material given at graduation.   Other: -Provides group and verbal instruction on various topics (see comments)   Knowledge Questionnaire Score:  Knowledge Questionnaire Score - 02/07/24 1621       Knowledge Questionnaire Score   Pre Score 14/18           Core Components/Risk Factors/Patient Goals at Admission:  Personal Goals and Risk Factors at Admission - 02/07/24 1621       Core Components/Risk Factors/Patient Goals on Admission    Weight Management Yes;Weight Gain    Intervention Weight Management: Provide education and appropriate resources to help participant work on and attain dietary goals.;Weight Management: Develop a combined nutrition and exercise program designed to reach desired caloric intake, while maintaining appropriate intake of nutrient and fiber, sodium and fats, and appropriate energy expenditure required for the weight goal.;Weight Management/Obesity: Establish reasonable short term and long term weight goals.    Admit Weight 75 lb 8 oz (34.2 kg)    Goal Weight: Short Term 80 lb (36.3 kg)    Goal Weight: Long Term 90 lb (40.8 kg)    Expected Outcomes Short Term: Continue to assess and modify interventions until short term weight is achieved;Long Term: Adherence to nutrition and physical activity/exercise program aimed toward attainment of established weight goal;Understanding recommendations for meals to include 15-35% energy as protein, 25-35% energy from fat, 35-60% energy from carbohydrates, less than 200mg  of dietary cholesterol, 20-35 gm of total fiber daily;Understanding of distribution of calorie intake throughout the day with the consumption of 4-5 meals/snacks;Weight Gain: Understanding of general recommendations for a high calorie, high protein meal plan that promotes weight gain by distributing calorie intake throughout the day with the  consumption for 4-5 meals, snacks, and/or supplements    Tobacco Cessation Yes    Number of packs per day 0.15 ppd    Intervention Assist the participant in steps to quit. Provide individualized education and counseling about committing  to Tobacco Cessation, relapse prevention, and pharmacological support that can be provided by physician.;Education officer, environmental, assist with locating and accessing local/national Quit Smoking programs, and support quit date choice.    Expected Outcomes Short Term: Will demonstrate readiness to quit, by selecting a quit date.;Short Term: Will quit all tobacco product use, adhering to prevention of relapse plan.;Long Term: Complete abstinence from all tobacco products for at least 12 months from quit date.    Improve shortness of breath with ADL's Yes    Intervention Provide education, individualized exercise plan and daily activity instruction to help decrease symptoms of SOB with activities of daily living.    Expected Outcomes Short Term: Improve cardiorespiratory fitness to achieve a reduction of symptoms when performing ADLs;Long Term: Be able to perform more ADLs without symptoms or delay the onset of symptoms    Hypertension Yes    Intervention Provide education on lifestyle modifcations including regular physical activity/exercise, weight management, moderate sodium restriction and increased consumption of fresh fruit, vegetables, and low fat dairy, alcohol moderation, and smoking cessation.;Monitor prescription use compliance.    Expected Outcomes Short Term: Continued assessment and intervention until BP is < 140/47mm HG in hypertensive participants. < 130/55mm HG in hypertensive participants with diabetes, heart failure or chronic kidney disease.;Long Term: Maintenance of blood pressure at goal levels.          Education:Diabetes - Individual verbal and written instruction to review signs/symptoms of diabetes, desired ranges of glucose level fasting,  after meals and with exercise. Acknowledge that pre and post exercise glucose checks will be done for 3 sessions at entry of program.   Know Your Numbers and Heart Failure: - Group verbal and visual instruction to discuss disease risk factors for cardiac and pulmonary disease and treatment options.  Reviews associated critical values for Overweight/Obesity, Hypertension, Cholesterol, and Diabetes.  Discusses basics of heart failure: signs/symptoms and treatments.  Introduces Heart Failure Zone chart for action plan for heart failure.  Written material given at graduation.   Core Components/Risk Factors/Patient Goals Review:    Core Components/Risk Factors/Patient Goals at Discharge (Final Review):    ITP Comments:  ITP Comments     Row Name 02/05/24 1333 02/05/24 1359 02/07/24 1607 02/13/24 0746     ITP Comments Initial phone call completed. Diagnosis can be found in [facility] 7/8. EP Orientation scheduled for Thursday 7/24 at 2pm.     Nao is a current tobacco user. Intervention for tobacco cessation was provided at the initial medical review. She was asked about readiness to quit and reported she is willing to quit. Patient was advised and educated about tobacco cessation using combination therapy, tobacco cessation classes, quit line, and quit smoking apps. Patient demonstrated understanding of this material. Staff will continue to provide encouragement and follow up with the patient throughout the program. Patient has a history of a AAA. Parameter checklist faxed to Dr. Bard stating our current parameters given by the program's medical director and asking for any new orders. Completed and gym orientation for respiratory care services. Initial ITP created and sent for review to Dr. Faud Aleskerov, Medical Director. Shanen is a current tobacco user. Intervention for tobacco cessation was provided at the initial medical review. She was asked about readiness to quit and reported she is willing to  quit. She is down to 3 cigarettes a day. Patient was advised and educated about tobacco cessation using combination therapy, tobacco cessation classes, quit line, and quit smoking apps. Patient demonstrated understanding of this  material. Staff will continue to provide encouragement and follow up with the patient throughout the program. 30 Day review completed. Medical Director ITP review done, changes made as directed, and signed approval by Medical Director. New to program.       Comments: 30 day review

## 2024-02-14 DIAGNOSIS — Z9181 History of falling: Secondary | ICD-10-CM | POA: Insufficient documentation

## 2024-02-14 DIAGNOSIS — M810 Age-related osteoporosis without current pathological fracture: Secondary | ICD-10-CM | POA: Insufficient documentation

## 2024-02-18 ENCOUNTER — Encounter: Attending: Pulmonary Disease | Admitting: *Deleted

## 2024-02-18 DIAGNOSIS — R0600 Dyspnea, unspecified: Secondary | ICD-10-CM | POA: Diagnosis not present

## 2024-02-18 NOTE — Progress Notes (Signed)
 Daily Session Note  Patient Details  Name: Christina Hester MRN: [id number] Date of Birth: 17-Mar-1941 Referring Provider:   Flowsheet Row Pulmonary Rehab from 02/07/2024 in [facility] Cardiac and Pulmonary Rehab  Referring Provider Parris Manna, MD    Encounter Date: 02/18/2024  Check In:  Session Check In - 02/18/24 1435       Check-In   Supervising physician immediately available to respond to emergencies See telemetry face sheet for immediately available ER MD    Location [facility]-Cardiac & Pulmonary Rehab    Staff Present Rollene Paterson, MS, Exercise Physiologist;Laureen Delores, BS, RRT, CPFT;Sabena Winner Tressa RN,BSN;Noah Tickle, BS, Exercise Physiologist    Virtual Visit No    Medication changes reported     No    Fall or balance concerns reported    No    Warm-up and Cool-down Performed on first and last piece of equipment    Resistance Training Performed Yes    VAD Patient? No    PAD/SET Patient? No      Pain Assessment   Currently in Pain? No/denies             Social History   Tobacco Use  Smoking Status Every Day   Current packs/day: 0.00   Types: Cigarettes   Last attempt to quit: 07/12/2017   Years since quitting: 6.6  Smokeless Tobacco Never    Goals Met:  Independence with exercise equipment Exercise tolerated well No report of concerns or symptoms today Strength training completed today  Goals Unmet:  Not Applicable  Comments: First full day of exercise!  Patient was oriented to gym and equipment including functions, settings, policies, and procedures.  Patient's individual exercise prescription and treatment plan were reviewed.  All starting workloads were established based on the results of the 6 minute walk test done at initial orientation visit.  The plan for exercise progression was also introduced and progression will be customized based on patient's performance and goals.     Dr. Oneil Pinal is Medical Director for [facility] Cardiac  Rehabilitation.  Dr. Fuad Aleskerov is Medical Director for [facility] Pulmonary Rehabilitation.

## 2024-02-20 ENCOUNTER — Encounter

## 2024-02-21 ENCOUNTER — Encounter: Payer: Self-pay | Admitting: *Deleted

## 2024-02-21 DIAGNOSIS — R0600 Dyspnea, unspecified: Secondary | ICD-10-CM

## 2024-02-25 ENCOUNTER — Encounter

## 2024-02-26 ENCOUNTER — Encounter: Payer: Self-pay | Admitting: Cardiology

## 2024-02-26 ENCOUNTER — Ambulatory Visit: Attending: Cardiology | Admitting: Cardiology

## 2024-02-26 VITALS — BP 125/67 | HR 76 | Ht 59.0 in | Wt 74.2 lb

## 2024-02-26 DIAGNOSIS — J449 Chronic obstructive pulmonary disease, unspecified: Secondary | ICD-10-CM | POA: Diagnosis not present

## 2024-02-26 DIAGNOSIS — I739 Peripheral vascular disease, unspecified: Secondary | ICD-10-CM | POA: Insufficient documentation

## 2024-02-26 DIAGNOSIS — Z720 Tobacco use: Secondary | ICD-10-CM | POA: Diagnosis not present

## 2024-02-26 DIAGNOSIS — I714 Abdominal aortic aneurysm, without rupture, unspecified: Secondary | ICD-10-CM | POA: Insufficient documentation

## 2024-02-26 DIAGNOSIS — I2510 Atherosclerotic heart disease of native coronary artery without angina pectoris: Secondary | ICD-10-CM | POA: Diagnosis not present

## 2024-02-26 DIAGNOSIS — I6521 Occlusion and stenosis of right carotid artery: Secondary | ICD-10-CM | POA: Diagnosis not present

## 2024-02-26 DIAGNOSIS — E782 Mixed hyperlipidemia: Secondary | ICD-10-CM | POA: Diagnosis not present

## 2024-02-26 DIAGNOSIS — I10 Essential (primary) hypertension: Secondary | ICD-10-CM | POA: Diagnosis not present

## 2024-02-26 MED ORDER — LOSARTAN POTASSIUM 25 MG PO TABS: 12.5000 mg | ORAL_TABLET | Freq: Every day | ORAL | 3 refills | Status: DC

## 2024-02-26 NOTE — Patient Instructions (Addendum)
 Medication Instructions:  START Losartan  12.5 mg daily   *If you need a refill on your cardiac medications before your next appointment, please call your pharmacy*  Lab Work: Your provider would like for you to return in 2-3 weeks to have the following labs drawn: BMET.   Please go to [facility] [address] ([facility] [address], [place] [address] You do not need an appointment.  They are open from 8 am- 4:30 pm.  Lunch from 1:00 pm- 2:00 pm You do not need to be fasting.   You may also go to one of the following [facility]:  [address] [place], [place] [address] Phone: [phone] Lab hours: Mon-Fri 8 am- 5 pm    Lunch 12 pm- 1 pm  [address] [place],  [place]  [address]  [place]  Phone: [phone] Lab hours: 7 am- 4 pm Lunch 12 pm-1 pm   [address] [place],  [place]  [address]  [place]  Phone: [phone] Lab hours: Mon-Fri 8 am- 5 pm    Lunch 12 pm- 1 pm  If you have labs (blood work) drawn today and your tests are completely normal, you will receive your results only by: MyChart Message (if you have MyChart) OR A paper copy in the mail If you have any lab test that is abnormal or we need to change your treatment, we will call you to review the results.  Follow-Up: At [facility], you and your health needs are our priority.  As part of our continuing mission to provide you with exceptional heart care, our providers are all part of one team.  This team includes your primary Cardiologist (physician) and Advanced Practice Providers or APPs (Physician Assistants and Nurse Practitioners) who all work together to provide you with the care you need, when you need it.  Your next appointment:   3 month(s)  Provider:   Dr.Arida or Tylene Lunch, NP   We recommend signing up for the patient portal called MyChart.  Sign up information is provided on this After Visit Summary.  MyChart is used to connect with patients for Virtual Visits  (Telemedicine).  Patients are able to view lab/test results, encounter notes, upcoming appointments, etc.  Non-urgent messages can be sent to your provider as well.   To learn more about what you can do with MyChart, go to [email or web address].

## 2024-02-26 NOTE — Progress Notes (Signed)
 Cardiology Office Note   Date:  02/26/2024  ID:  Christina Hester, DOB 1941-04-21, MRN [id number] PCP: Liana Fish, NP  [facility] Providers Cardiologist:  Deatrice Cage, MD     History of Present Illness Christina Hester is a 83 y.o. female with a past medical history of coronary artery disease, COPD, prolonged tobacco use, moderate bilateral carotid disease, abdominal aortic aneurysm followed by [facility], hypertension, HFpEF, peripheral vascular disease, hyperlipidemia, who is here today to follow-up on her coronary artery disease.   Previous NSTEMI in 02/2017 with left main normal, LAD with mild disease, D2 with moderate irregularities, left circumflex with mild disease, OM 250% ostial stenosis, RCA 95% proximal stenosis 100% mid stenosis with left-to-right collaterals. Recommended medical therapy.  Echocardiogram completed in 02/2017 revealed an LVEF of 55 to 60%, possible mild hypokinesis of the inferior myocardium, G1 DD, mild mitral regurgitation.  She underwent Lexiscan  Myoview  in 11/2021 which was considered a low risk study.  Event monitor in 11/2021 showed predominant underlying rhythm was sinus with an average heart rate of 72 bpm with 14 supraventricular tachycardia runs and rare PACs and PVCs.  She was seen in clinic 01/05/2023 by Dr. Cage.  At that time she had recent multiple hospitalizations for COPD exacerbation and findings of ectopy months ago.  She is on chronic oxygen  at home.  She was continued on her current medication regimen without any changes needed at that time.   She was last seen in clinic 08/07/2023 secondary to have been doing overall well from a cardiac perspective.  She continued to follow with pulmonary and recently started on new medication which she stated made all the difference in overall.  She is continued on chronic oxygen  at 2 L O2 via nasal cannula.  There were no medication changes that were made and no further testing that was ordered at that  time.  She returns clinic today accompanied by her daughter.  She states that the cardiac perspective she has overall been doing well.  She denies any chest pain but continues to have her chronic shortness of breath.  She continues to follow with pulmonary and was recently sent through pulmonary rehab.  She send unfortunately they had wanted her to do a 6-minute walk and she had noted a jump in her blood pressure from 130s to 160 back down to 130s.  She has been unable to do exercises at rehab due to elevated blood pressures.  She has continued to use nocturnal oxygen  at 2 L of O2 via nasal cannula.  She continues to complain of discoloration to her feet and follows with vascular unfortunately she continues to smoke approximately 3 cigarettes/day.  States that she has been compliant with her current medication regimen without any undue side effects.  Denies any recent hospitalizations or visits to the emergency department.  ROS: 10 point review of system has been reviewed and considered negative with exception was been listed in HPI  Studies Reviewed EKG Interpretation Date/Time:  Tuesday February 26 2024 13:53:10 EDT Ventricular Rate:  76 PR Interval:  150 QRS Duration:  66 QT Interval:  372 QTC Calculation: 418 R Axis:   85  Text Interpretation: Normal sinus rhythm Normal ECG When compared with ECG of 15-Aug-2023 13:28, No significant change was found Confirmed by Gerard Frederick ([id number]) on 02/26/2024 2:12:50 PM    Event Monitor ([employer]) 12/13/2021 Patient had a min HR of 55 bpm, max HR of 179 bpm, and avg HR of 72 bpm. Predominant underlying  rhythm was Sinus Rhythm.  14 Supraventricular Tachycardia runs occurred, the run with the fastest interval lasting 5 beats with a max rate of 179 bpm, the longest lasting 14 beats with an avg rate of 100 bpm. Rare PACs and rare PVCs.   Lexiscan  Myoview  11/15/2021   Normal pharmacologic myocardial perfusion stress test without evidence of significant ischemia or  scar.   Left ventricular systolic function is normal.   Coronary artery and aortic valvular calcifications are noted, as well as aortic atherosclerosis.   Emphysematous changes are noted in the visualized lungs on the attenuation correction CT.   This is a low risk study.   2D echo 03/05/2017 Study Conclusions  - Left ventricle: The cavity size was normal. There was mild    concentric hypertrophy. Systolic function was normal. The    estimated ejection fraction was in the range of 55% to 60%.    Possible mild hypokinesis of the inferior myocardium. Doppler    parameters are consistent with abnormal left ventricular    relaxation (grade 1 diastolic dysfunction).  - Mitral valve: There was mild regurgitation.    LHC 03/05/2017 The left ventricular systolic function is normal. LV end diastolic pressure is mildly elevated. The left ventricular ejection fraction is 55-65% by visual estimate. Prox RCA lesion, 95 %stenosed. Mid RCA lesion, 100 %stenosed. Ost 2nd Mrg to 2nd Mrg lesion, 50 %stenosed.   1. Severe one-vessel coronary artery disease with chronically occluded right coronary artery with well-developed left-to-right collaterals. Mild to moderate nonobstructive disease affecting the left system with extremely tortuous coronary arteries. 2. Normal LV systolic function and mildly elevated left ventricular end-diastolic pressure.   Recommendations: Medical therapy. Risk Assessment/Calculations           Physical Exam VS:  BP 125/67 (BP Location: Left Arm, Patient Position: Sitting, Cuff Size: Normal)   Pulse 76   Ht 4' 11 (1.499 m)   Wt 74 lb 3.2 oz (33.7 kg)   SpO2 93%   BMI 14.99 kg/m        Wt Readings from Last 3 Encounters:  02/26/24 74 lb 3.2 oz (33.7 kg)  02/13/24 75 lb 6.4 oz (34.2 kg)  02/07/24 75 lb 11.2 oz (34.3 kg)    GEN: Well nourished, well developed in no acute distress NECK: No JVD; No carotid bruits CARDIAC: RRR, no murmurs, rubs, gallops RESPIRATORY:   Clear to auscultation without rales, wheezing or rhonchi  ABDOMEN: Soft, non-tender, non-distended EXTREMITIES:  No edema; No deformity, palpable pulses to the bilateral lower extremities without ulcerations.  ASSESSMENT AND PLAN Coronary artery disease of native coronary arteries without angina or anginal equivalents.  Symptoms are well-controlled on amlodipine  5 mg daily.  She is continued on clopidogrel  75 mg daily long-term given her allergy to aspirin .  EKG today reveals sinus rhythm with a rate of 76 with no ischemic changes noted.  No further ischemic testing is needed at this time.  Mixed hyperlipidemia with last LDL of 59 with a goal of at least 17.  She continues to refuse any statin medications.  She will need an updated lipid panel as it was 11 months ago.  Will revisit the possibility of bempedoic acid or injectables on return.  Primary hypertension with a blood pressure 125/67 with blood pressures at home when it affords up to 180.  With concern for elevated blood pressures she has been started on losartan  12.5 mg daily with a repeat BMP in 2 to 3 weeks to reevaluate electrolytes and kidney  function after starting the medication, along with amlodipine  5 mg daily bisoprolol  5 mg daily.  She has been encouraged to continue to monitor pressures 1 to 2 hours postmedication administration as well.  Tobacco abuse with smoking cessation recommended.  AAA which is slightly increased in size now measures 2.8 cm in diameter.  Continue to follow annually until it reaches 4 cm.  According to vascular given her extremely small size if there is continued growth may consider fixing at 4.5 cm versus 5 cm.  Annual surveillance studies and ongoing management per [facility]VS.  Carotid stenosis with recent carotid duplex showing the right carotid endarterectomy to be stable and  40-59% left ICA stenosis.  Recommend continue with yearly surveillance studies.  She is continued on clopidogrel  75 mg  daily.  Peripheral arterial disease where she had rubs and her ABIs down to 0.79 on the right 0.82 on the left but continues to have multiphasic waveforms.  She does not have any rest pain or ulceration.  She is limited in her activity by her respiratory status.  Continues to be followed by [facility].  COPD requiring nocturnal oxygen  not in acute exacerbation.  She continues to be followed by pulmonary.       Dispo: Patient to return to clinic to see MD/APP in 3 months or sooner if needed for further evaluation  Signed, Adelena Desantiago, NP

## 2024-02-27 ENCOUNTER — Encounter

## 2024-03-03 ENCOUNTER — Encounter

## 2024-03-04 ENCOUNTER — Telehealth: Payer: Self-pay | Admitting: Cardiovascular Disease

## 2024-03-04 DIAGNOSIS — I10 Essential (primary) hypertension: Secondary | ICD-10-CM

## 2024-03-04 MED ORDER — AMLODIPINE BESYLATE 5 MG PO TABS: 5.0000 mg | ORAL_TABLET | Freq: Two times a day (BID) | ORAL | 3 refills | Status: AC

## 2024-03-04 NOTE — Telephone Encounter (Signed)
 Pt c/o medication issue:  1. Name of Medication: losartan  (COZAAR ) 25 MG tablet   2. How are you currently taking this medication (dosage and times per day)?    3. Are you having a reaction (difficulty breathing--STAT)? no  4. What is your medication issue? States that she is unable to take the medication. States that it makes her exteremly dizzy. Pleaser advise  .

## 2024-03-04 NOTE — Telephone Encounter (Signed)
 Ask her to hold losartan  to see if symptoms resolve.  Does she have updated blood pressure readings since taking the medication? May need to change losartan  to another antihypertensive if blood pressure continues to remain elevated.

## 2024-03-04 NOTE — Telephone Encounter (Signed)
 Spoke with patient and she reports the following blood pressures:  160/75 before meds 157/65 156/68 140/70 150/66 166/66  She does state she has severe back pain. Advised that I would update provider with these readings and we will be in touch.

## 2024-03-04 NOTE — Telephone Encounter (Signed)
 Patient states that this medication (Losartan ) is causing her severe dizziness. She reports trying to get up slowly but it did not help. She stated that every time she turns around or even laying in bed she just feels so dizzy. Advised that I would send this over to provider for her review and recommendations and that we will be in touch.

## 2024-03-04 NOTE — Telephone Encounter (Signed)
 Patient has pain management appointment for her back pain tomorrow 8/20. Reviewed provider recommendations to increase her amlodipine  to 5 mg twice a day. She verbalized understanding with no further questions.

## 2024-03-04 NOTE — Telephone Encounter (Signed)
 Recommend moving her follow up appointment up to evaluate blood pressure and medication change.

## 2024-03-05 ENCOUNTER — Encounter

## 2024-03-05 ENCOUNTER — Other Ambulatory Visit: Payer: Self-pay | Admitting: Physical Medicine and Rehabilitation

## 2024-03-05 DIAGNOSIS — M5414 Radiculopathy, thoracic region: Secondary | ICD-10-CM

## 2024-03-05 DIAGNOSIS — M5126 Other intervertebral disc displacement, lumbar region: Secondary | ICD-10-CM | POA: Diagnosis not present

## 2024-03-05 DIAGNOSIS — M5416 Radiculopathy, lumbar region: Secondary | ICD-10-CM | POA: Diagnosis not present

## 2024-03-06 ENCOUNTER — Encounter: Payer: Self-pay | Admitting: *Deleted

## 2024-03-06 DIAGNOSIS — R0600 Dyspnea, unspecified: Secondary | ICD-10-CM

## 2024-03-06 NOTE — Progress Notes (Signed)
 Early Discharge Summary  Christina Hester 1941/05/14  [name] informed staff that she needs to be discharged at this time due to her back concerns and per her spinal MD's guidance. She completed 2 of 36 sessions.     6 Minute Walk     Row Name 02/07/24 1608         6 Minute Walk   Phase Initial     Distance 845 feet     Walk Time 6 minutes     # of Rest Breaks 0     MPH 1.6     METS 1.99     RPE 15     Perceived Dyspnea  1     VO2 Peak 6.95     Symptoms Yes (comment)     Comments Glute muscle pain (5/10)     Resting HR 84 bpm     Resting BP 148/70     Resting Oxygen  Saturation  94 %     Exercise Oxygen  Saturation  during 6 min walk 89 %     Max Ex. HR 88 bpm     Max Ex. BP 176/86     2 Minute Post BP 160/82       Interval HR   1 Minute HR 82     2 Minute HR 85     3 Minute HR 86     4 Minute HR 88     5 Minute HR 86     6 Minute HR 88     2 Minute Post HR 73     Interval Heart Rate? Yes       Interval Oxygen    Interval Oxygen ? Yes     Baseline Oxygen  Saturation % 94 %     1 Minute Oxygen  Saturation % 93 %     1 Minute Liters of Oxygen  0 L     2 Minute Oxygen  Saturation % 97 %     2 Minute Liters of Oxygen  0 L     3 Minute Oxygen  Saturation % 91 %     3 Minute Liters of Oxygen  0 L     4 Minute Oxygen  Saturation % 89 %     4 Minute Liters of Oxygen  0 L     5 Minute Oxygen  Saturation % 90 %     5 Minute Liters of Oxygen  0 L     6 Minute Oxygen  Saturation % 90 %     6 Minute Liters of Oxygen  0 L     2 Minute Post Oxygen  Saturation % 94 %     2 Minute Post Liters of Oxygen  0 L

## 2024-03-06 NOTE — Telephone Encounter (Signed)
 Patient has been scheduled after her labs that were ordered for 2-3 weeks. She confirmed appointment and verbalized understanding to have labs before that appointment. She had no further questions at this time.

## 2024-03-06 NOTE — Telephone Encounter (Signed)
 Left voicemail message to call back

## 2024-03-06 NOTE — Progress Notes (Signed)
 Pulmonary Individual Treatment Plan  Patient Details  Name: Christina Hester MRN: [id number] Date of Birth: 30-Dec-1940 Referring Provider:   Flowsheet Row Pulmonary Rehab from 02/07/2024 in [facility] Cardiac and Pulmonary Rehab  Referring Provider Parris Manna, MD    Initial Encounter Date:  Flowsheet Row Pulmonary Rehab from 02/07/2024 in [facility] Cardiac and Pulmonary Rehab  Date 02/07/24    Visit Diagnosis: Dyspnea, unspecified type  Patient's Home Medications on Admission:  Current Outpatient Medications:    amLODipine  (NORVASC ) 5 MG tablet, Take 1 tablet (5 mg total) by mouth 2 (two) times daily., Disp: 180 tablet, Rfl: 3   azithromycin  (ZITHROMAX ) 250 MG tablet, Take 1 tablet by mouth every Monday, Wednesday, and Friday., Disp: , Rfl:    bisoprolol  (ZEBETA ) 5 MG tablet, Take 1 tablet (5 mg total) by mouth daily., Disp: 90 tablet, Rfl: 3   buPROPion  (WELLBUTRIN ) 100 MG tablet, Take 1 tablet (100 mg total) by mouth daily., Disp: 90 tablet, Rfl: 3   clopidogrel  (PLAVIX ) 75 MG tablet, Take 1 tablet (75 mg total) by mouth daily., Disp: 90 tablet, Rfl: 3   clotrimazole  (MYCELEX ) 10 MG troche, Take 1 tablet (10 mg total) by mouth daily as needed (thrush)., Disp: 70 Troche, Rfl: 0   Ensifentrine  (OHTUVAYRE ) 3 MG/2.5ML SUSP, Inhale 3 mg into the lungs in the morning and at bedtime., Disp: , Rfl:    ipratropium-albuterol  (DUONEB) 0.5-2.5 (3) MG/3ML SOLN, USE 1 VIAL IN NEBULIZER EVERY 4 HOURS, Disp: 120 mL, Rfl: 3   LORazepam  (ATIVAN ) 0.5 MG tablet, TAKE 1/2 TO 1 TABLET BY MOUTH EVERY 8 HOURS AS NEEDED FOR ANXIETY OR SLEEP, Disp: 70 tablet, Rfl: 2   losartan  (COZAAR ) 25 MG tablet, Take 0.5 tablets (12.5 mg total) by mouth daily., Disp: 45 tablet, Rfl: 3   meloxicam  (MOBIC ) 15 MG tablet, Take 1 tablet (15 mg total) by mouth daily. (Patient taking differently: Take 7.5 mg by mouth daily as needed.), Disp: 30 tablet, Rfl: 2   nitroGLYCERIN  (NITROSTAT ) 0.4 MG SL tablet, Place 1 tablet (0.4 mg total)  under the tongue every 5 (five) minutes as needed for chest pain., Disp: 20 tablet, Rfl: 12   nystatin  (MYCOSTATIN ) 100000 UNIT/ML suspension, Take 5 mLs (500,000 Units total) by mouth 4 (four) times daily. Until thrush is resolved. May keep as needed for recurrences., Disp: 473 mL, Rfl: 0   ondansetron  (ZOFRAN ) 4 MG tablet, Take 1 tablet (4 mg total) by mouth every 8 (eight) hours as needed for nausea or vomiting., Disp: 20 tablet, Rfl: 5   pantoprazole  (PROTONIX ) 40 MG tablet, Take 1 tablet (40 mg total) by mouth daily., Disp: 90 tablet, Rfl: 3   predniSONE  (DELTASONE ) 1 MG tablet, Take 3 mg by mouth daily with breakfast., Disp: , Rfl:    rOPINIRole  (REQUIP ) 0.25 MG tablet, Take 1 tablet (0.25 mg total) by mouth at bedtime., Disp: 30 tablet, Rfl: 2   Spacer/Aero-Holding Chambers (EASIVENT) inhaler, See admin instructions., Disp: , Rfl:    VENTOLIN  HFA 108 (90 Base) MCG/ACT inhaler, INHALE 2 PUFFS INTO THE LUNGS EVERY 6 HOURS AS NEEDED FOR WHEEZING OR SHORTNESS OF BREATH., Disp: 18 g, Rfl: 3  Past Medical History: Past Medical History:  Diagnosis Date   AAA (abdominal aortic aneurysm) (HCC)    a. 05/2016 Abd U/S: 3.02 x 3.02 AAA.   Anxiety    CAD (coronary artery disease)    a. 02/2017 NSTEMI/Cath: LM nl, LAD mild dzs, D2 min irregs, LCX mild dzs, OM2 50ost, RCA 95p, 164m,  L-->R collats, EF 55-65%.   COPD (chronic obstructive pulmonary disease) (HCC)    Diastolic dysfunction    a. 02/2017 Echo: EF 55-60%, gr1 DD, ? inf HK, mild MR.   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 05/2016 ABI's: R 1.22, L 1.11.   Tobacco abuse     Tobacco Use: Social History   Tobacco Use  Smoking Status Every Day   Current packs/day: 0.00   Types: Cigarettes   Last attempt to quit: 07/12/2017   Years since quitting: 6.6  Smokeless Tobacco Never     Labs: Review Flowsheet  More data exists      Latest Ref Rng & Units 01/07/2018 02/08/2021 04/04/2022 11/25/2022 03/06/2023  Labs for ITP Cardiac and Pulmonary Rehab  Cholestrol 100 - 199 mg/dL 746  737  734  - 710   LDL (calc) 0 - 99 mg/dL 861  849  854  - 840   HDL-C >39 mg/dL 94  896  893  - 881   Trlycerides 0 - 149 mg/dL 895  56  87  - 75   Bicarbonate 20.0 - 28.0 mmol/L - - - 39.2  -  O2 Saturation % - - - 79.4  -     Pulmonary Assessment Scores:  Pulmonary Assessment Scores     Row Name 02/07/24 1614         ADL UCSD   ADL Phase Entry     SOB Score total 73     Rest 1     Walk 4     Stairs 3     Bath 4     Dress 2     Shop 4       CAT Score   CAT Score 27       mMRC Score   mMRC Score 2        UCSD: Self-administered rating of dyspnea associated with activities of daily living (ADLs) 6-point scale (0 = not at all to 5 = maximal or unable to do because of breathlessness)  Scoring Scores range from 0 to 120.  Minimally important difference is 5 units  CAT: CAT can identify the health impairment of COPD patients and is better correlated with disease progression.  CAT has a scoring range of zero to 40. The CAT score is classified into four groups of low (less than 10), medium (10 - 20), high (21-30) and very high (31-40) based on the impact level of disease on health status. A CAT score over 10 suggests significant symptoms.  A worsening CAT score could be explained by an exacerbation, poor medication adherence, poor inhaler technique, or progression of COPD or comorbid conditions.  CAT MCID is 2 points  mMRC: mMRC (Modified Medical Research Council) Dyspnea Scale is used to assess the degree of baseline functional disability in patients of respiratory disease due to dyspnea. No minimal important difference is established. A decrease in score of 1 point or greater is considered a positive change.   Pulmonary Function Assessment:   Exercise Target  Goals: Exercise Program Goal: Individual exercise prescription set using results from initial 6 min walk test and THRR while considering  patient's activity barriers and safety.   Exercise Prescription Goal: Initial exercise prescription builds to 30-45 minutes a day of aerobic activity,  2-3 days per week.  Home exercise guidelines will be given to patient during program as part of exercise prescription that the participant will acknowledge.  Education: Aerobic Exercise: - Group verbal and visual presentation on the components of exercise prescription. Introduces F.I.T.T principle from ACSM for exercise prescriptions.  Reviews F.I.T.T. principles of aerobic exercise including progression. Written material provided at class time.   Education: Resistance Exercise: - Group verbal and visual presentation on the components of exercise prescription. Introduces F.I.T.T principle from ACSM for exercise prescriptions  Reviews F.I.T.T. principles of resistance exercise including progression. Written material provided at class time.    Education: Exercise & Equipment Safety: - Individual verbal instruction and demonstration of equipment use and safety with use of the equipment. Flowsheet Row Pulmonary Rehab from 02/07/2024 in [facility] Cardiac and Pulmonary Rehab  Date 02/07/24  Educator MB  Instruction Review Code 1- Verbalizes Understanding    Education: Exercise Physiology & General Exercise Guidelines: - Group verbal and written instruction with models to review the exercise physiology of the cardiovascular system and associated critical values. Provides general exercise guidelines with specific guidelines to those with heart or lung disease.    Education: Flexibility, Balance, Mind/Body Relaxation: - Group verbal and visual presentation with interactive activity on the components of exercise prescription. Introduces F.I.T.T principle from ACSM for exercise prescriptions. Reviews F.I.T.T. principles  of flexibility and balance exercise training including progression. Also discusses the mind body connection.  Reviews various relaxation techniques to help reduce and manage stress (i.e. Deep breathing, progressive muscle relaxation, and visualization). Balance handout provided to take home. Written material provided at class time.   Activity Barriers & Risk Stratification:  Activity Barriers & Cardiac Risk Stratification - 02/07/24 1610       Activity Barriers & Cardiac Risk Stratification   Activity Barriers Back Problems;Other (comment)    Comments possible nerve issue in right leg          6 Minute Walk:  6 Minute Walk     Row Name 02/07/24 1608         6 Minute Walk   Phase Initial     Distance 845 feet     Walk Time 6 minutes     # of Rest Breaks 0     MPH 1.6     METS 1.99     RPE 15     Perceived Dyspnea  1     VO2 Peak 6.95     Symptoms Yes (comment)     Comments Glute muscle pain (5/10)     Resting HR 84 bpm     Resting BP 148/70     Resting Oxygen  Saturation  94 %     Exercise Oxygen  Saturation  during 6 min walk 89 %     Max Ex. HR 88 bpm     Max Ex. BP 176/86     2 Minute Post BP 160/82       Interval HR   1 Minute HR 82     2 Minute HR 85     3 Minute HR 86     4 Minute HR 88     5 Minute HR 86     6 Minute HR 88     2 Minute Post HR 73     Interval Heart Rate? Yes       Interval Oxygen    Interval Oxygen ? Yes     Baseline Oxygen  Saturation % 94 %     1  Minute Oxygen  Saturation % 93 %     1 Minute Liters of Oxygen  0 L     2 Minute Oxygen  Saturation % 97 %     2 Minute Liters of Oxygen  0 L     3 Minute Oxygen  Saturation % 91 %     3 Minute Liters of Oxygen  0 L     4 Minute Oxygen  Saturation % 89 %     4 Minute Liters of Oxygen  0 L     5 Minute Oxygen  Saturation % 90 %     5 Minute Liters of Oxygen  0 L     6 Minute Oxygen  Saturation % 90 %     6 Minute Liters of Oxygen  0 L     2 Minute Post Oxygen  Saturation % 94 %     2 Minute Post  Liters of Oxygen  0 L       Oxygen  Initial Assessment:  Oxygen  Initial Assessment - 02/05/24 1306       Home Oxygen    Home Oxygen  Device E-Tanks;Portable Concentrator    Sleep Oxygen  Prescription Continuous    Liters per minute 2    Home Exercise Oxygen  Prescription Continuous   prn   Liters per minute 2    Home Resting Oxygen  Prescription None    Compliance with Home Oxygen  Use Yes      Intervention   Short Term Goals To learn and exhibit compliance with exercise, home and travel O2 prescription;To learn and understand importance of monitoring SPO2 with pulse oximeter and demonstrate accurate use of the pulse oximeter.;To learn and understand importance of maintaining oxygen  saturations>88%;To learn and demonstrate proper pursed lip breathing techniques or other breathing techniques. ;To learn and demonstrate proper use of respiratory medications    Long  Term Goals Exhibits compliance with exercise, home  and travel O2 prescription;Verbalizes importance of monitoring SPO2 with pulse oximeter and return demonstration;Maintenance of O2 saturations>88%;Exhibits proper breathing techniques, such as pursed lip breathing or other method taught during program session;Compliance with respiratory medication;Demonstrates proper use of MDI's          Oxygen  Re-Evaluation:  Oxygen  Re-Evaluation     Row Name 02/18/24 1439             Goals/Expected Outcomes   Comments Reviewed PLB technique with pt.  Talked about how it works and it's importance in maintaining their exercise saturations.       Goals/Expected Outcomes Short: Become more profiecient at using PLB.   Long: Become independent at using PLB.          Oxygen  Discharge (Final Oxygen  Re-Evaluation):  Oxygen  Re-Evaluation - 02/18/24 1439       Goals/Expected Outcomes   Comments Reviewed PLB technique with pt.  Talked about how it works and it's importance in maintaining their exercise saturations.    Goals/Expected Outcomes  Short: Become more profiecient at using PLB.   Long: Become independent at using PLB.          Initial Exercise Prescription:  Initial Exercise Prescription - 02/07/24 1600       Date of Initial Exercise RX and Referring Provider   Date 02/07/24    Referring Provider Parris Manna, MD      Oxygen    Maintain Oxygen  Saturation 88% or higher      Treadmill   MPH 1.3   Try   Grade 0    Minutes 15    METs 2      Recumbant Bike  Level 1    RPM 50    Watts 15    Minutes 15    METs 1.99      NuStep   Level 1    SPM 80    Minutes 15    METs 1.99      Track   Laps 18    Minutes 15    METs 1.98      Prescription Details   Frequency (times per week) 2    Duration Progress to 30 minutes of continuous aerobic without signs/symptoms of physical distress      Intensity   THRR 40-80% of Max Heartrate 101-125    Ratings of Perceived Exertion 11-13    Perceived Dyspnea 0-4      Progression   Progression Continue to progress workloads to maintain intensity without signs/symptoms of physical distress.      Resistance Training   Training Prescription Yes    Weight 2 lb    Reps 10-15          Perform Capillary Blood Glucose checks as needed.  Exercise Prescription Changes:   Exercise Prescription Changes     Row Name 02/07/24 1600 03/04/24 1400           Response to Exercise   Blood Pressure (Admit) 148/70 132/74      Blood Pressure (Exercise) 176/86 160/72      Blood Pressure (Exit) 160/82 130/74      Heart Rate (Admit) 84 bpm 81 bpm      Heart Rate (Exercise) 88 bpm 88 bpm      Heart Rate (Exit) 77 bpm 85 bpm      Oxygen  Saturation (Admit) 94 % 94 %      Oxygen  Saturation (Exercise) 89 % 88 %      Oxygen  Saturation (Exit) 95 % 91 %      Rating of Perceived Exertion (Exercise) 15 15      Perceived Dyspnea (Exercise) 1 1      Symptoms Glute muscle pain (5/10) none      Comments results First full day of exercise      Duration -- Continue with 30  min of aerobic exercise without signs/symptoms of physical distress.      Intensity THRR New THRR unchanged        Progression   Progression -- Continue to progress workloads to maintain intensity without signs/symptoms of physical distress.      Average METs 1.99 2.34        Resistance Training   Training Prescription -- Yes      Weight -- 2 lb      Reps -- 10-15        Interval Training   Interval Training -- No        Recumbant Bike   Level -- 1      Watts -- 15      Minutes -- 15      METs -- 3.38        NuStep   Level -- 1      Minutes -- 15      METs -- 1.3        Oxygen    Maintain Oxygen  Saturation -- 88% or higher         Exercise Comments:   Exercise Comments     Row Name 02/18/24 1437           Exercise Comments First full day of exercise!  Patient was oriented to  gym and equipment including functions, settings, policies, and procedures.  Patient's individual exercise prescription and treatment plan were reviewed.  All starting workloads were established based on the results of the 6 minute walk test done at initial orientation visit.  The plan for exercise progression was also introduced and progression will be customized based on patient's performance and goals.          Exercise Goals and Review:   Exercise Goals     Row Name 02/07/24 1613             Exercise Goals   Increase Physical Activity Yes       Intervention Provide advice, education, support and counseling about physical activity/exercise needs.;Develop an individualized exercise prescription for aerobic and resistive training based on initial evaluation findings, risk stratification, comorbidities and participant's personal goals.       Expected Outcomes Long Term: Add in home exercise to make exercise part of routine and to increase amount of physical activity.;Short Term: Attend rehab on a regular basis to increase amount of physical activity.;Long Term: Exercising regularly at least  3-5 days a week.       Increase Strength and Stamina Yes       Intervention Provide advice, education, support and counseling about physical activity/exercise needs.;Develop an individualized exercise prescription for aerobic and resistive training based on initial evaluation findings, risk stratification, comorbidities and participant's personal goals.       Expected Outcomes Short Term: Increase workloads from initial exercise prescription for resistance, speed, and METs.;Short Term: Perform resistance training exercises routinely during rehab and add in resistance training at home;Long Term: Improve cardiorespiratory fitness, muscular endurance and strength as measured by increased METs and functional capacity ( )       Able to understand and use rate of perceived exertion (RPE) scale Yes       Intervention Provide education and explanation on how to use RPE scale       Expected Outcomes Short Term: Able to use RPE daily in rehab to express subjective intensity level;Long Term:  Able to use RPE to guide intensity level when exercising independently       Able to understand and use Dyspnea scale Yes       Intervention Provide education and explanation on how to use Dyspnea scale       Expected Outcomes Short Term: Able to use Dyspnea scale daily in rehab to express subjective sense of shortness of breath during exertion;Long Term: Able to use Dyspnea scale to guide intensity level when exercising independently       Knowledge and understanding of Target Heart Rate Range (THRR) Yes       Intervention Provide education and explanation of THRR including how the numbers were predicted and where they are located for reference       Expected Outcomes Short Term: Able to state/look up THRR;Short Term: Able to use daily as guideline for intensity in rehab;Long Term: Able to use THRR to govern intensity when exercising independently       Able to check pulse independently Yes       Intervention Provide  education and demonstration on how to check pulse in carotid and radial arteries.;Review the importance of being able to check your own pulse for safety during independent exercise       Expected Outcomes Short Term: Able to explain why pulse checking is important during independent exercise;Long Term: Able to check pulse independently and accurately  Understanding of Exercise Prescription Yes       Intervention Provide education, explanation, and written materials on patient's individual exercise prescription       Expected Outcomes Short Term: Able to explain program exercise prescription;Long Term: Able to explain home exercise prescription to exercise independently          Exercise Goals Re-Evaluation :  Exercise Goals Re-Evaluation     Row Name 02/18/24 1438 03/04/24 1414           Exercise Goal Re-Evaluation   Exercise Goals Review Increase Physical Activity;Increase Strength and Stamina;Able to understand and use rate of perceived exertion (RPE) scale;Able to understand and use Dyspnea scale;Knowledge and understanding of Target Heart Rate Range (THRR);Able to check pulse independently;Understanding of Exercise Prescription Increase Physical Activity;Increase Strength and Stamina;Understanding of Exercise Prescription      Comments Reviewed RPE and dyspnea scale, THR and program prescription with pt today.  Pt voiced understanding and was given a copy of goals to take home. Akshara is off to a good start in the program. She did well at level one on both the T4 nustep and recumbent bike. She also did well with 2 lb hand weights for resistance training. We will continue to monitor her progress in the program.      Expected Outcomes Short: Use RPE daily to regulate intensity.  Long: Follow program prescription in [facility]. --         Discharge Exercise Prescription (Final Exercise Prescription Changes):  Exercise Prescription Changes - 03/04/24 1400       Response to Exercise   Blood  Pressure (Admit) 132/74    Blood Pressure (Exercise) 160/72    Blood Pressure (Exit) 130/74    Heart Rate (Admit) 81 bpm    Heart Rate (Exercise) 88 bpm    Heart Rate (Exit) 85 bpm    Oxygen  Saturation (Admit) 94 %    Oxygen  Saturation (Exercise) 88 %    Oxygen  Saturation (Exit) 91 %    Rating of Perceived Exertion (Exercise) 15    Perceived Dyspnea (Exercise) 1    Symptoms none    Comments First full day of exercise    Duration Continue with 30 min of aerobic exercise without signs/symptoms of physical distress.    Intensity THRR unchanged      Progression   Progression Continue to progress workloads to maintain intensity without signs/symptoms of physical distress.    Average METs 2.34      Resistance Training   Training Prescription Yes    Weight 2 lb    Reps 10-15      Interval Training   Interval Training No      Recumbant Bike   Level 1    Watts 15    Minutes 15    METs 3.38      NuStep   Level 1    Minutes 15    METs 1.3      Oxygen    Maintain Oxygen  Saturation 88% or higher          Nutrition:  Target Goals: Understanding of nutrition guidelines, daily intake of sodium 1500mg , cholesterol 200mg , calories 30% from fat and 7% or less from saturated fats, daily to have 5 or more servings of fruits and vegetables.  Education: Nutrition 1 -Group instruction provided by verbal, written material, interactive activities, discussions, models, and posters to present general guidelines for heart healthy nutrition including macronutrients, label reading, and promoting whole foods over processed counterparts. Education serves as Pensions consultant  of discussion of heart healthy eating for all. Written material provided at class time.     Education: Nutrition 2 -Group instruction provided by verbal, written material, interactive activities, discussions, models, and posters to present general guidelines for heart healthy nutrition including sodium, cholesterol, and saturated  fat. Providing guidance of habit forming to improve blood pressure, cholesterol, and body weight. Written material provided at class time.     Biometrics:  Pre Biometrics - 02/07/24 1614       Pre Biometrics   Height 4' 10.6 (1.488 m)    Weight 75 lb 11.2 oz (34.3 kg)    Waist Circumference 26 inches    Hip Circumference 31.2 inches    Waist to Hip Ratio 0.83 %    BMI (Calculated) 15.51    Single Leg Stand 10 seconds           Nutrition Therapy Plan and Nutrition Goals:  Nutrition Therapy & Goals - 02/07/24 1620       Personal Nutrition Goals   Nutrition Goal Wants to wait a few weeks to schedule RD appointment      Intervention Plan   Intervention Prescribe, educate and counsel regarding individualized specific dietary modifications aiming towards targeted core components such as weight, hypertension, lipid management, diabetes, heart failure and other comorbidities.    Expected Outcomes Short Term Goal: Understand basic principles of dietary content, such as calories, fat, sodium, cholesterol and nutrients.          Nutrition Assessments:  MEDIFICTS Score Key: >=70 Need to make dietary changes  40-70 Heart Healthy Diet <= 40 Therapeutic Level Cholesterol Diet  Flowsheet Row Pulmonary Rehab from 02/07/2024 in [facility] Cardiac and Pulmonary Rehab  Picture Your Plate Total Score on Admission 58   Picture Your Plate Scores: <59 Unhealthy dietary pattern with much room for improvement. 41-50 Dietary pattern unlikely to meet recommendations for good health and room for improvement. 51-60 More healthful dietary pattern, with some room for improvement.  >60 Healthy dietary pattern, although there may be some specific behaviors that could be improved.   Nutrition Goals Re-Evaluation:   Nutrition Goals Discharge (Final Nutrition Goals Re-Evaluation):   Psychosocial: Target Goals: Acknowledge presence or absence of significant depression and/or stress, maximize coping  skills, provide positive support system. Participant is able to verbalize types and ability to use techniques and skills needed for reducing stress and depression.   Education: Stress, Anxiety, and Depression - Group verbal and visual presentation to define topics covered.  Reviews how body is impacted by stress, anxiety, and depression.  Also discusses healthy ways to reduce stress and to treat/manage anxiety and depression.  Written material provided at class time.   Education: Sleep Hygiene -Provides group verbal and written instruction about how sleep can affect your health.  Define sleep hygiene, discuss sleep cycles and impact of sleep habits. Review good sleep hygiene tips.    Initial Review & Psychosocial Screening:  Initial Psych Review & Screening - 02/05/24 1315       Initial Review   Current issues with Current Stress Concerns;Current Anxiety/Panic    Source of Stress Concerns Chronic Illness      Family Dynamics   Good Support System? Yes      Barriers   Psychosocial barriers to participate in program There are no identifiable barriers or psychosocial needs.;The patient should benefit from training in stress management and relaxation.      Screening Interventions   Interventions Encouraged to exercise    Expected Outcomes  Short Term goal: Utilizing psychosocial counselor, staff and physician to assist with identification of specific Stressors or current issues interfering with healing process. Setting desired goal for each stressor or current issue identified.;Long Term Goal: Stressors or current issues are controlled or eliminated.;Short Term goal: Identification and review with participant of any Quality of Life or Depression concerns found by scoring the questionnaire.;Long Term goal: The participant improves quality of Life and PHQ9 Scores as seen by post scores and/or verbalization of changes          Quality of Life Scores:  Scores of 19 and below usually indicate  a poorer quality of life in these areas.  A difference of  2-3 points is a clinically meaningful difference.  A difference of 2-3 points in the total score of the Quality of Life Index has been associated with significant improvement in overall quality of life, self-image, physical symptoms, and general health in studies assessing change in quality of life.  PHQ-9: Review Flowsheet  More data exists      02/07/2024 01/25/2023 09/04/2022 04/20/2022 01/19/2022  Depression screen PHQ 2/9  Decreased Interest 2 0 0 0 0  Down, Depressed, Hopeless 1 0 0 0 0  PHQ - 2 Score 3 0 0 0 0  Altered sleeping 2 - - - -  Tired, decreased energy 2 - - - -  Change in appetite 1 - - - -  Feeling bad or failure about yourself  1 - - - -  Trouble concentrating 0 - - - -  Moving slowly or fidgety/restless 1 - - - -  Suicidal thoughts 0 - - - -  PHQ-9 Score 10 - - - -  Difficult doing work/chores Very difficult - - - -   Interpretation of Total Score  Total Score Depression Severity:  1-4 = Minimal depression, 5-9 = Mild depression, 10-14 = Moderate depression, 15-19 = Moderately severe depression, 20-27 = Severe depression   Psychosocial Evaluation and Intervention:  Psychosocial Evaluation - 02/05/24 1355       Psychosocial Evaluation & Interventions   Interventions Encouraged to exercise with the program and follow exercise prescription;Stress management education;Relaxation education    Comments Ms. Brouillard is coming to pulmonary rehab for dyspna and COPD. She ahs oxygen  to use as needed. She has medication to help with anxiety and she states she thinks a lot of her anxiety comes from her breathing issues. She wants to gain weight and muscle to help with her overall stamina. She has cut back her cigarette intake and wants to quit completely, but mentions she uses it has a reward system so she needs to find another reward instead of cigarettes. She has a supportive daughter. She is looking forward to attending  the program to help with her stamina and education on her health    Expected Outcomes Short: attend pulmonary rehab for education and exercise Long: develop and maintain positive self care habits    Continue Psychosocial Services  Follow up required by staff          Psychosocial Re-Evaluation:   Psychosocial Discharge (Final Psychosocial Re-Evaluation):   Education: Education Goals: Education classes will be provided on a weekly basis, covering required topics. Participant will state understanding/return demonstration of topics presented.  Learning Barriers/Preferences:  Learning Barriers/Preferences - 02/05/24 1315       Learning Barriers/Preferences   Learning Barriers None    Learning Preferences None          General Pulmonary Education Topics:  Infection Prevention: - Provides verbal and written material to individual with discussion of infection control including proper hand washing and proper equipment cleaning during exercise session. Flowsheet Row Pulmonary Rehab from 02/07/2024 in [facility] Cardiac [facility] Pul[facility] Rehab  Date 02/07/24  Educator MB  Instruction Review Code 1- Verbalizes Understanding    Falls Prevention: - Provides verbal and written material to individual with discussion of falls prevention and safety. Flowsheet Row Pulmonary Rehab from 02/07/2024 in [facility] Cardiac and Pulmonary Rehab  Date 02/07/24  Educator MB  Instruction Review Code 1- Verbalizes Understanding    Chronic Lung Disease Review: - Group verbal instruction with posters, models, PowerPoint presentations and videos,  to review new updates, new respiratory medications, new advancements in procedures and treatments. Providing information on websites and 800 numbers for continued self-education. Includes information about supplement oxygen , available portable oxygen  systems, continuous and intermittent flow rates, oxygen  safety, concentrators, and Medicare reimbursement for oxygen .  Explanation of Pulmonary Drugs, including class, frequency, complications, importance of spacers, rinsing mouth after steroid MDI's, and proper cleaning methods for nebulizers. Review of basic lung anatomy and physiology related to function, structure, and complications of lung disease. Review of risk factors. Discussion about methods for diagnosing sleep apnea and types of masks and machines for OSA. Includes a review of the use of types of environmental controls: home humidity, furnaces, filters, dust mite/pet prevention, HEPA vacuums. Discussion about weather changes, air quality and the benefits of nasal washing. Instruction on Warning signs, infection symptoms, calling MD promptly, preventive modes, and value of vaccinations. Review of effective airway clearance, coughing and/or vibration techniques. Emphasizing that all should Create an Action Plan. Written material provided at class time. Flowsheet Row Pulmonary Rehab from 02/07/2024 in [facility] Cardiac and Pulmonary Rehab  Education need identified 02/07/24    AED/CPR: - Group verbal and written instruction with the use of models to demonstrate the basic use of the AED with the basic ABC's of resuscitation.    Tests and Procedures:  - Group verbal and visual presentation and models provide information about basic cardiac anatomy and function. Reviews the testing methods done to diagnose heart disease and the outcomes of the test results. Describes the treatment choices: Medical Management, Angioplasty, or Coronary Bypass Surgery for treating various heart conditions including Myocardial Infarction, Angina, Valve Disease, and Cardiac Arrhythmias.  Written material provided at class time.   Medication Safety: - Group verbal and visual instruction to review commonly prescribed medications for heart and lung disease. Reviews the medication, class of the drug, and side effects. Includes the steps to properly store meds and maintain the prescription  regimen.  Written material given at graduation.   Other: -Provides group and verbal instruction on various topics (see comments)   Knowledge Questionnaire Score:  Knowledge Questionnaire Score - 02/07/24 1621       Knowledge Questionnaire Score   Pre Score 14/18           Core Components/Risk Factors/Patient Goals at Admission:  Personal Goals and Risk Factors at Admission - 02/07/24 1621       Core Components/Risk Factors/Patient Goals on Admission    Weight Management Yes;Weight Gain    Intervention Weight Management: Provide education and appropriate resources to help participant work on and attain dietary goals.;Weight Management: Develop a combined nutrition and exercise program designed to reach desired caloric intake, while maintaining appropriate intake of nutrient and fiber, sodium and fats, and appropriate energy expenditure required for the weight goal.;Weight Management/Obesity: Establish reasonable short term and long  term weight goals.    Admit Weight 75 lb 8 oz (34.2 kg)    Goal Weight: Short Term 80 lb (36.3 kg)    Goal Weight: Long Term 90 lb (40.8 kg)    Expected Outcomes Short Term: Continue to assess and modify interventions until short term weight is achieved;Long Term: Adherence to nutrition and physical activity/exercise program aimed toward attainment of established weight goal;Understanding recommendations for meals to include 15-35% energy as protein, 25-35% energy from fat, 35-60% energy from carbohydrates, less than 200mg  of dietary cholesterol, 20-35 gm of total fiber daily;Understanding of distribution of calorie intake throughout the day with the consumption of 4-5 meals/snacks;Weight Gain: Understanding of general recommendations for a high calorie, high protein meal plan that promotes weight gain by distributing calorie intake throughout the day with the consumption for 4-5 meals, snacks, and/or supplements    Tobacco Cessation Yes    Number of packs per  day 0.15 ppd    Intervention Assist the participant in steps to quit. Provide individualized education and counseling about committing to Tobacco Cessation, relapse prevention, and pharmacological support that can be provided by physician.;Education officer, environmental, assist with locating and accessing local/national Quit Smoking programs, and support quit date choice.    Expected Outcomes Short Term: Will demonstrate readiness to quit, by selecting a quit date.;Short Term: Will quit all tobacco product use, adhering to prevention of relapse plan.;Long Term: Complete abstinence from all tobacco products for at least 12 months from quit date.    Improve shortness of breath with ADL's Yes    Intervention Provide education, individualized exercise plan and daily activity instruction to help decrease symptoms of SOB with activities of daily living.    Expected Outcomes Short Term: Improve cardiorespiratory fitness to achieve a reduction of symptoms when performing ADLs;Long Term: Be able to perform more ADLs without symptoms or delay the onset of symptoms    Hypertension Yes    Intervention Provide education on lifestyle modifcations including regular physical activity/exercise, weight management, moderate sodium restriction and increased consumption of fresh fruit, vegetables, and low fat dairy, alcohol moderation, and smoking cessation.;Monitor prescription use compliance.    Expected Outcomes Short Term: Continued assessment and intervention until BP is < 140/10mm HG in hypertensive participants. < 130/30mm HG in hypertensive participants with diabetes, heart failure or chronic kidney disease.;Long Term: Maintenance of blood pressure at goal levels.          Education:Diabetes - Individual verbal and written instruction to review signs/symptoms of diabetes, desired ranges of glucose level fasting, after meals and with exercise. Acknowledge that pre and post exercise glucose checks will be done for 3  sessions at entry of program.   Know Your Numbers and Heart Failure: - Group verbal and visual instruction to discuss disease risk factors for cardiac and pulmonary disease and treatment options.  Reviews associated critical values for Overweight/Obesity, Hypertension, Cholesterol, and Diabetes.  Discusses basics of heart failure: signs/symptoms and treatments.  Introduces Heart Failure Zone chart for action plan for heart failure. Written material provided at class time.   Core Components/Risk Factors/Patient Goals Review:    Core Components/Risk Factors/Patient Goals at Discharge (Final Review):    ITP Comments:  ITP Comments     Row Name 02/05/24 1333 02/05/24 1359 02/07/24 1607 02/13/24 0746 02/18/24 1436   ITP Comments Initial phone call completed. Diagnosis can be found in [facility] 7/8. EP Orientation scheduled for Thursday 7/24 at 2pm.     Kieran is a current tobacco user. Intervention  for tobacco cessation was provided at the initial medical review. She was asked about readiness to quit and reported she is willing to quit. Patient was advised and educated about tobacco cessation using combination therapy, tobacco cessation classes, quit line, and quit smoking apps. Patient demonstrated understanding of this material. Staff will continue to provide encouragement and follow up with the patient throughout the program. Patient has a history of a AAA. Parameter checklist faxed to Dr. Bard stating our current parameters given by the program's medical director and asking for any new orders. Completed and gym orientation for respiratory care services. Initial ITP created and sent for review to Dr. Faud Aleskerov, Medical Director. Amillion is a current tobacco user. Intervention for tobacco cessation was provided at the initial medical review. She was asked about readiness to quit and reported she is willing to quit. She is down to 3 cigarettes a day. Patient was advised and educated about tobacco  cessation using combination therapy, tobacco cessation classes, quit line, and quit smoking apps. Patient demonstrated understanding of this material. Staff will continue to provide encouragement and follow up with the patient throughout the program. 30 Day review completed. Medical Director ITP review done, changes made as directed, and signed approval by Medical Director. New to program. First full day of exercise!  Patient was oriented to gym and equipment including functions, settings, policies, and procedures.  Patient's individual exercise prescription and treatment plan were reviewed.  All starting workloads were established based on the results of the 6 minute walk test done at initial orientation visit.  The plan for exercise progression was also introduced and progression will be customized based on patient's performance and goals.    Row Name 02/21/24 1548 03/06/24 1234         ITP Comments Netty is requesting to be placed on a medical hold until 9/1 due to back pain and fatigue. She is going to see her doctor after she had fallen and hurt her back. She plans to call staff the end of the month to give an update. Symantha informed staff that she needs to be discharged at this time due to her back concerns and per her spinal MD's guidance. She completed 2 of 36 sessions.         Comments: Early Discharge ITP

## 2024-03-07 ENCOUNTER — Inpatient Hospital Stay: Admission: RE | Admit: 2024-03-07 | Source: Ambulatory Visit

## 2024-03-07 ENCOUNTER — Other Ambulatory Visit

## 2024-03-10 ENCOUNTER — Encounter

## 2024-03-10 ENCOUNTER — Ambulatory Visit
Admission: RE | Admit: 2024-03-10 | Discharge: 2024-03-10 | Disposition: A | Discharge status: Home / Self Care | Source: Ambulatory Visit | Attending: Physical Medicine and Rehabilitation | Admitting: Physical Medicine and Rehabilitation

## 2024-03-10 DIAGNOSIS — M5117 Intervertebral disc disorders with radiculopathy, lumbosacral region: Secondary | ICD-10-CM | POA: Diagnosis not present

## 2024-03-10 DIAGNOSIS — M5114 Intervertebral disc disorders with radiculopathy, thoracic region: Secondary | ICD-10-CM | POA: Diagnosis not present

## 2024-03-10 DIAGNOSIS — M4724 Other spondylosis with radiculopathy, thoracic region: Secondary | ICD-10-CM | POA: Diagnosis not present

## 2024-03-10 DIAGNOSIS — M5416 Radiculopathy, lumbar region: Secondary | ICD-10-CM

## 2024-03-10 DIAGNOSIS — M5414 Radiculopathy, thoracic region: Secondary | ICD-10-CM

## 2024-03-12 ENCOUNTER — Encounter

## 2024-03-18 DIAGNOSIS — R0902 Hypoxemia: Secondary | ICD-10-CM | POA: Diagnosis not present

## 2024-03-18 DIAGNOSIS — R0609 Other forms of dyspnea: Secondary | ICD-10-CM | POA: Diagnosis not present

## 2024-03-18 DIAGNOSIS — Z9981 Dependence on supplemental oxygen: Secondary | ICD-10-CM | POA: Diagnosis not present

## 2024-03-18 DIAGNOSIS — F17200 Nicotine dependence, unspecified, uncomplicated: Secondary | ICD-10-CM | POA: Diagnosis not present

## 2024-03-18 DIAGNOSIS — J449 Chronic obstructive pulmonary disease, unspecified: Secondary | ICD-10-CM | POA: Diagnosis not present

## 2024-03-19 ENCOUNTER — Encounter

## 2024-03-20 DIAGNOSIS — I200 Unstable angina: Secondary | ICD-10-CM | POA: Diagnosis not present

## 2024-03-20 DIAGNOSIS — I2510 Atherosclerotic heart disease of native coronary artery without angina pectoris: Secondary | ICD-10-CM | POA: Diagnosis not present

## 2024-03-20 DIAGNOSIS — I714 Abdominal aortic aneurysm, without rupture, unspecified: Secondary | ICD-10-CM | POA: Diagnosis not present

## 2024-03-21 ENCOUNTER — Ambulatory Visit: Payer: Self-pay | Admitting: Cardiology

## 2024-03-21 LAB — BASIC METABOLIC PANEL WITH GFR
BUN/Creatinine Ratio: 20 (ref 12–28)
BUN: 20 mg/dL (ref 8–27)
CO2: 28 mmol/L (ref 20–29)
Calcium: 9.8 mg/dL (ref 8.7–10.3)
Chloride: 94 mmol/L — ABNORMAL LOW (ref 96–106)
Creatinine, Ser: 1 mg/dL (ref 0.57–1.00)
Glucose: 95 mg/dL (ref 70–99)
Potassium: 3.9 mmol/L (ref 3.5–5.2)
Sodium: 138 mmol/L (ref 134–144)
eGFR: 56 mL/min/1.73 — ABNORMAL LOW (ref >59)

## 2024-03-21 NOTE — Progress Notes (Signed)
 Kidney function remains stable.  Continue current medication regimen without changes needed at this time.

## 2024-03-24 ENCOUNTER — Encounter

## 2024-03-25 ENCOUNTER — Encounter: Payer: Self-pay | Admitting: Cardiology

## 2024-03-25 ENCOUNTER — Ambulatory Visit (INDEPENDENT_AMBULATORY_CARE_PROVIDER_SITE_OTHER): Admitting: Nurse Practitioner

## 2024-03-25 ENCOUNTER — Ambulatory Visit: Attending: Cardiology | Admitting: Cardiology

## 2024-03-25 ENCOUNTER — Encounter: Payer: Self-pay | Admitting: Nurse Practitioner

## 2024-03-25 VITALS — BP 128/72 | HR 80 | Temp 98.1°F | Resp 16 | Ht 59.0 in | Wt 73.6 lb

## 2024-03-25 VITALS — BP 100/60 | HR 79 | Ht <= 58 in | Wt 74.0 lb

## 2024-03-25 DIAGNOSIS — I5032 Chronic diastolic (congestive) heart failure: Secondary | ICD-10-CM | POA: Diagnosis not present

## 2024-03-25 DIAGNOSIS — I10 Essential (primary) hypertension: Secondary | ICD-10-CM

## 2024-03-25 DIAGNOSIS — I2510 Atherosclerotic heart disease of native coronary artery without angina pectoris: Secondary | ICD-10-CM | POA: Diagnosis not present

## 2024-03-25 DIAGNOSIS — E782 Mixed hyperlipidemia: Secondary | ICD-10-CM | POA: Insufficient documentation

## 2024-03-25 DIAGNOSIS — M810 Age-related osteoporosis without current pathological fracture: Secondary | ICD-10-CM

## 2024-03-25 DIAGNOSIS — I6521 Occlusion and stenosis of right carotid artery: Secondary | ICD-10-CM | POA: Diagnosis not present

## 2024-03-25 DIAGNOSIS — K219 Gastro-esophageal reflux disease without esophagitis: Secondary | ICD-10-CM | POA: Insufficient documentation

## 2024-03-25 DIAGNOSIS — I739 Peripheral vascular disease, unspecified: Secondary | ICD-10-CM | POA: Diagnosis not present

## 2024-03-25 DIAGNOSIS — Z720 Tobacco use: Secondary | ICD-10-CM | POA: Insufficient documentation

## 2024-03-25 DIAGNOSIS — F411 Generalized anxiety disorder: Secondary | ICD-10-CM | POA: Diagnosis not present

## 2024-03-25 DIAGNOSIS — Z0000 Encounter for general adult medical examination without abnormal findings: Secondary | ICD-10-CM

## 2024-03-25 DIAGNOSIS — I714 Abdominal aortic aneurysm, without rupture, unspecified: Secondary | ICD-10-CM | POA: Insufficient documentation

## 2024-03-25 DIAGNOSIS — J449 Chronic obstructive pulmonary disease, unspecified: Secondary | ICD-10-CM | POA: Insufficient documentation

## 2024-03-25 MED ORDER — LORAZEPAM 0.5 MG PO TABS: ORAL_TABLET | ORAL | 2 refills | Status: DC

## 2024-03-25 NOTE — Progress Notes (Signed)
 [facility] [address] [place], [place] [address]  Internal MEDICINE  Office Visit Note  Patient Name: Christina Hester  [id number]  [id number]  Date of Service: 03/25/2024  Chief Complaint  Patient presents with   Hyperlipidemia   Hypertension   Medicare Wellness    HPI Christina Hester presents for a medicare annual wellness visit.  Well-appearing 83 y.o. female with  Routine CRC screening: discontinued, aged out  Routine mammogram: discontinued, aged out.  DEXA scan: done in may last year Labs: has some recent labs, will have more labs done at next office visit.  New or worsening pain: chronic back pain, sees physical medicine doctor.  Other concerns: none  Has ropinirole  for restless legs but has not needed this, she is used a topical which is helping.      03/25/2024   11:15 AM 01/25/2023   11:08 AM 01/19/2022   11:14 AM  MMSE - Mini Mental State Exam  Orientation to time 5 5 5   Orientation to Place 5 5 5   Registration 3 3 3   Attention/ Calculation 5 5 5   Recall 3 3 3   Language- name 2 objects 2 2 2   Language- repeat 1 1 1   Language- follow 3 step command 3 3 3   Language- read & follow direction 1 1 1   Write a sentence 1 1 1   Copy design 1 1 1   Total score 30 30 30     Functional Status Survey: Is the patient deaf or have difficulty hearing?: No Does the patient have difficulty seeing, even when wearing glasses/contacts?: No Does the patient have difficulty concentrating, remembering, or making decisions?: No Does the patient have difficulty walking or climbing stairs?: No Does the patient have difficulty dressing or bathing?: No Does the patient have difficulty doing errands alone such as visiting a doctor's office or shopping?: No     06/09/2022    8:35 PM 09/04/2022   11:56 AM 01/25/2023   11:07 AM 02/05/2024    1:05 PM 03/25/2024   11:14 AM  Fall Risk  Falls in the past year?  0 0 0 1  Was there an injury with Fall?  0 0 0 0  Fall Risk Category  Calculator  0 0 0 1  (RETIRED) Patient Fall Risk Level Low fall risk       Patient at Risk for Falls Due to  No Fall Risks No Fall Risks    Fall risk Follow up  Falls evaluation completed Falls evaluation completed  Falls evaluation completed     Data saved with a previous flowsheet row definition       03/25/2024   11:14 AM  Depression screen PHQ 2/9  Decreased Interest 0  Down, Depressed, Hopeless 0  PHQ - 2 Score 0        Current Medication: Outpatient Encounter Medications as of 03/25/2024  Medication Sig   arformoterol  (BROVANA ) 15 MCG/2ML NEBU Inhale 15 mcg into the lungs.   amLODipine  (NORVASC ) 5 MG tablet Take 1 tablet (5 mg total) by mouth 2 (two) times daily.   azithromycin  (ZITHROMAX ) 250 MG tablet Take 1 tablet by mouth every Monday, Wednesday, and Friday.   bisoprolol  (ZEBETA ) 5 MG tablet Take 1 tablet (5 mg total) by mouth daily.   buPROPion  (WELLBUTRIN ) 100 MG tablet Take 1 tablet (100 mg total) by mouth daily.   clopidogrel  (PLAVIX ) 75 MG tablet Take 1 tablet (75 mg total) by mouth daily.   clotrimazole  (MYCELEX ) 10 MG troche Take 1  tablet (10 mg total) by mouth daily as needed (thrush).   Ensifentrine  (OHTUVAYRE ) 3 MG/2.5ML SUSP Inhale 3 mg into the lungs in the morning and at bedtime.   ipratropium-albuterol  (DUONEB) 0.5-2.5 (3) MG/3ML SOLN USE 1 VIAL IN NEBULIZER EVERY 4 HOURS   LORazepam  (ATIVAN ) 0.5 MG tablet TAKE 1/2 TO 1 TABLET BY MOUTH EVERY 8 HOURS AS NEEDED FOR ANXIETY OR SLEEP   losartan  (COZAAR ) 25 MG tablet Take 0.5 tablets (12.5 mg total) by mouth daily.   meloxicam  (MOBIC ) 15 MG tablet Take 1 tablet (15 mg total) by mouth daily. (Patient taking differently: Take 7.5 mg by mouth daily as needed.)   nitroGLYCERIN  (NITROSTAT ) 0.4 MG SL tablet Place 1 tablet (0.4 mg total) under the tongue every 5 (five) minutes as needed for chest pain.   nystatin  (MYCOSTATIN ) 100000 UNIT/ML suspension Take 5 mLs (500,000 Units total) by mouth 4 (four) times daily. Until  thrush is resolved. May keep as needed for recurrences.   ondansetron  (ZOFRAN ) 4 MG tablet Take 1 tablet (4 mg total) by mouth every 8 (eight) hours as needed for nausea or vomiting.   pantoprazole  (PROTONIX ) 40 MG tablet Take 1 tablet (40 mg total) by mouth daily.   predniSONE  (DELTASONE ) 1 MG tablet Take 3 mg by mouth daily with breakfast.   rOPINIRole  (REQUIP ) 0.25 MG tablet Take 1 tablet (0.25 mg total) by mouth at bedtime.   Spacer/Aero-Holding Chambers (EASIVENT) inhaler See admin instructions.   VENTOLIN  HFA 108 (90 Base) MCG/ACT inhaler INHALE 2 PUFFS INTO THE LUNGS EVERY 6 HOURS AS NEEDED FOR WHEEZING OR SHORTNESS OF BREATH.   [DISCONTINUED] LORazepam  (ATIVAN ) 0.5 MG tablet TAKE 1/2 TO 1 TABLET BY MOUTH EVERY 8 HOURS AS NEEDED FOR ANXIETY OR SLEEP   No facility-administered encounter medications on file as of 03/25/2024.    Surgical History: Past Surgical History:  Procedure Laterality Date   ABDOMINAL HYSTERECTOMY     APPENDECTOMY     BREAST EXCISIONAL BIOPSY Left 80's or 90's   NEG   CARDIAC CATHETERIZATION     COLONOSCOPY N/A 05/22/2018   Procedure: COLONOSCOPY;  Surgeon: Unk Corinn Skiff, MD;  Location: [facility];  Service: Gastroenterology;  Laterality: N/A;   COLONOSCOPY WITH PROPOFOL  N/A 07/19/2020   Procedure: COLONOSCOPY WITH PROPOFOL ;  Surgeon: Toledo, Ladell POUR, MD;  Location: [facility] ENDOS[facility];  Service: Gastroenterology;  Laterality: N/A;   ENDARTERECTOMY Right 11/06/2019   Procedure: ENDARTERECTOMY CAROTID;  Surgeon: Marea Selinda RAMAN, MD;  Location: [facility] ORS;  Service: Vascular;  Laterality: Right;   LEFT HEART CATH Bilateral 03/05/2017   Procedure: Left Heart Cath with poss PCI;  Surgeon: Darron Deatrice LABOR, MD;  Location: [facility] INVASIVE CV LAB;  Service: Cardiovascular;  Laterality: Bilateral;    Medical History: Past Medical History:  Diagnosis Date   AAA (abdominal aortic aneurysm) (HCC)    a. 05/2016 Abd U/S: 3.02 x 3.02 AAA.   Anxiety    CAD (coronary artery  disease)    a. 02/2017 NSTEMI/Cath: LM nl, LAD mild dzs, D2 min irregs, LCX mild dzs, OM2 50ost, RCA 95p, 145m, L-->R collats, EF 55-65%.   COPD (chronic obstructive pulmonary disease) (HCC)    Diastolic dysfunction    a. 02/2017 Echo: EF 55-60%, gr1 DD, ? inf HK, mild MR.   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 05/2016 ABI's: R 1.22, L 1.11.   Tobacco abuse     Family History: Family History  Problem Relation Age of Onset   Breast cancer Mother    Dementia Mother    Hypertension Mother    Breast cancer Maternal Aunt    Breast cancer Maternal Aunt    Breast cancer Maternal Aunt    CVA Father     Social History   Socioeconomic History   Marital status: Divorced    Spouse name: Not on file   Number of children: Not on file   Years of education: Not on file   Highest education level: Not on file  Occupational History   Not on file  Tobacco Use   Smoking status: Every Day    Current packs/day: 0.00    Types: Cigarettes    Last attempt to quit: 07/12/2017    Years since quitting: 6.7   Smokeless tobacco: Never  Vaping Use   Vaping status: Never Used  Substance and Sexual Activity   Alcohol use: No   Drug use: No   Sexual activity: Not on file  Other Topics Concern   Not on file  Social History Narrative   Not on file   Social Drivers of Health   Financial Resource Strain: Low Risk  (03/05/2024)   Received from [facility]   Overall Financial Resource Strain (CARDIA)    Difficulty of Paying Living Expenses: Not hard at all  Food Insecurity: No Food Insecurity (03/05/2024)   Received from [facility]   Hunger Vital Sign    Within the past 12 months, you worried that your food would run out before you got the money to buy more.: Never true    Within the past 12 months, the food you  bought just didn't last and you didn't have money to get more.: Never true  Transportation Needs: No Transportation Needs (03/05/2024)   Received from [facility]    In the past 12 months, has lack of transportation kept you from medical appointments or from getting medications?: No    Lack of Transportation (Non-Medical): No  Physical Activity: Not on file  Stress: Not on file  Social Connections: Not on file  Intimate Partner Violence: Not At Risk (11/26/2022)   Humiliation, Afraid, Rape, and Kick questionnaire    Fear of Current or Ex-Partner: No    Emotionally Abused: No    Physically Abused: No    Sexually Abused: No      Review of Systems  Constitutional:  Negative for activity change, appetite change, chills, fatigue, fever and unexpected weight change.  HENT: Negative.  Negative for congestion, ear pain, rhinorrhea, sore throat and trouble swallowing.   Eyes: Negative.   Respiratory:  Positive for cough, chest tightness, shortness of breath and wheezing (intermittent).   Cardiovascular: Negative.  Negative for chest pain and palpitations.  Gastrointestinal: Negative.  Negative for abdominal pain, blood in stool, constipation, diarrhea, nausea and vomiting.  Endocrine: Negative.   Genitourinary: Negative.  Negative for difficulty urinating, dysuria, frequency, hematuria and urgency.  Musculoskeletal:  Positive for arthralgias. Negative for back pain, joint swelling, myalgias and neck pain.  Skin: Negative.  Negative for rash and wound.  Allergic/Immunologic: Negative.  Negative for immunocompromised state.  Neurological: Negative.  Negative for dizziness, seizures, weakness, numbness and headaches.  Hematological: Negative.   Psychiatric/Behavioral:  Positive for sleep disturbance. Negative for behavioral problems, self-injury and suicidal ideas.  The patient is nervous/anxious.     Vital Signs: BP 128/72   Pulse 80   Temp 98.1  F (36.7 C)   Resp 16   Ht 4' 11 (1.499 m)   Wt 73 lb 9.6 oz (33.4 kg)   SpO2 93%   BMI 14.87 kg/m    Physical Exam Vitals reviewed.  Constitutional:      General: She is awake. She is not in acute distress.    Appearance: Normal appearance. She is well-developed and well-groomed. She is obese. She is not ill-appearing or diaphoretic.  HENT:     Head: Normocephalic and atraumatic.     Right Ear: Tympanic membrane, ear canal and external ear normal. There is no impacted cerumen.     Left Ear: Tympanic membrane, ear canal and external ear normal. There is no impacted cerumen.     Nose: Nose normal. No congestion or rhinorrhea.     Mouth/Throat:     Lips: Pink.     Mouth: Mucous membranes are moist.     Pharynx: Oropharynx is clear. Uvula midline. No oropharyngeal exudate or posterior oropharyngeal erythema.  Eyes:     General: Lids are normal. Vision grossly intact. Gaze aligned appropriately. No scleral icterus.       Right eye: No discharge.        Left eye: No discharge.     Conjunctiva/sclera: Conjunctivae normal.     Pupils: Pupils are equal, round, and reactive to light.     Funduscopic exam:    Right eye: Red reflex present.        Left eye: Red reflex present. Neck:     Thyroid : No thyromegaly.     Vascular: No JVD.     Trachea: Trachea and phonation normal. No tracheal deviation.  Cardiovascular:     Rate and Rhythm: Normal rate and regular rhythm.     Pulses: Normal pulses.     Heart sounds: Normal heart sounds, S1 normal and S2 normal. No murmur heard.    No friction rub. No gallop.  Pulmonary:     Effort: Pulmonary effort is normal. No accessory muscle usage or respiratory distress.     Breath sounds: Normal air entry. No stridor. Examination of the right-lower field reveals decreased breath sounds. Examination of the left-lower field reveals decreased breath sounds. Decreased breath sounds present. No wheezing or rales.  Chest:     Chest wall: No tenderness.      Comments: Declined breast exam.  Abdominal:     General: Abdomen is flat. Bowel sounds are normal. There is no distension.     Palpations: Abdomen is soft. There is no shifting dullness, fluid wave, mass or pulsatile mass.     Tenderness: There is no abdominal tenderness. There is no guarding or rebound.  Musculoskeletal:        General: No tenderness or deformity. Normal range of motion.     Cervical back: Normal range of motion and neck supple.     Right lower leg: No edema.     Left lower leg: No edema.  Lymphadenopathy:     Cervical: No cervical adenopathy.  Skin:    General: Skin is warm and dry.     Capillary Refill: Capillary refill takes less than 2 seconds.     Coloration: Skin is not pale.     Findings: No erythema or rash.  Neurological:     Mental Status: She is alert and oriented to person, place, and time.  Cranial Nerves: No cranial nerve deficit.     Motor: No abnormal muscle tone.     Coordination: Coordination normal.     Gait: Gait normal.     Deep Tendon Reflexes: Reflexes are normal and symmetric.  Psychiatric:        Mood and Affect: Mood normal.        Behavior: Behavior normal. Behavior is cooperative.        Thought Content: Thought content normal.        Judgment: Judgment normal.        Assessment/Plan: 1. Encounter for subsequent annual wellness visit (AWV) in Medicare patient (Primary) Age-appropriate preventive screenings and vaccinations discussed. Routine labs for health maintenance deferred until next visit. PHM updated.    2. Advanced COPD (HCC) Continue medications prescribed by pulmonology and follow up with pulmonology as instructed.   3. Chronic diastolic (congestive) heart failure (HCC) Follow up with cardiology as instructed   4. Essential hypertension Cardiology increased her blood pressure medication and her BP is stable today.   5. Osteoporosis of multiple sites Noted, taking calcium  supplement. Declined  biphosphonates.   6. Gastroesophageal reflux disease without esophagitis Continue pantoprazole  as prescribed.   7. Generalized anxiety disorder Continue lorazepam  as prescribed, follow up in 3 months for additional refills.  - LORazepam  (ATIVAN ) 0.5 MG tablet; TAKE 1/2 TO 1 TABLET BY MOUTH EVERY 8 HOURS AS NEEDED FOR ANXIETY OR SLEEP  Dispense: 70 tablet; Refill: 2     General Counseling: Geanine verbalizes understanding of the findings of todays visit and agrees with plan of treatment. I have discussed any further diagnostic evaluation that may be needed or ordered today. We also reviewed her medications today. she has been encouraged to call the office with any questions or concerns that should arise related to todays visit.    No orders of the defined types were placed in this encounter.   Meds ordered this encounter  Medications   LORazepam  (ATIVAN ) 0.5 MG tablet    Sig: TAKE 1/2 TO 1 TABLET BY MOUTH EVERY 8 HOURS AS NEEDED FOR ANXIETY OR SLEEP    Dispense:  70 tablet    Refill:  2    Please keep on file for future refills    Return in about 3 months (around 06/18/2024) for F/U, anxiety med refill, Apollos Tenbrink PCP.   Total time spent:30 Minutes Time spent includes review of chart, medications, test results, and follow up plan with the patient.    Controlled Substance Database was reviewed by me.  This patient was seen by Mardy Maxin, FNP-C in collaboration with Dr. Sigrid Bathe as a part of collaborative care agreement.  Mililani Murthy R. Maxin, MSN, FNP-C Internal medicine

## 2024-03-25 NOTE — Patient Instructions (Signed)
 Medication Instructions:  Your physician recommends that you continue on your current medications as directed. Please refer to the Current Medication list given to you today.   *If you need a refill on your cardiac medications before your next appointment, please call your pharmacy*  Lab Work: No labs ordered today  If you have labs (blood work) drawn today and your tests are completely normal, you will receive your results only by: MyChart Message (if you have MyChart) OR A paper copy in the mail If you have any lab test that is abnormal or we need to change your treatment, we will call you to review the results.  Testing/Procedures: No test ordered today   Follow-Up: At [facility], you and your health needs are our priority.  As part of our continuing mission to provide you with exceptional heart care, our providers are all part of one team.  This team includes your primary Cardiologist (physician) and Advanced Practice Providers or APPs (Physician Assistants and Nurse Practitioners) who all work together to provide you with the care you need, when you need it.  Your next appointment:   6 week(s)   BP CHECK  Provider:   You may see Deatrice Cage, MD or one of the following Advanced Practice Providers on your designated Care Team:   Lonni Meager, NP Lesley Maffucci, PA-C Bernardino Bring, PA-C Cadence Williams, PA-C Tylene Lunch, NP Barnie Hila, NP

## 2024-03-25 NOTE — Progress Notes (Signed)
 Cardiology Office Note   Date:  03/25/2024  ID:  Christina Hester, DOB 1941/05/31, MRN [id number] PCP: Liana Fish, NP  [facility] Providers Cardiologist:  Deatrice Cage, MD     History of Present Illness Christina Hester is a 83 y.o. female with a past medical history of coronary disease, COPD, prolonged tobacco use, moderate bilateral carotid disease, abdominal aortic aneurysm followed by [facility], hypertension, HFpEF, peripheral vascular disease, hyperlipidemia, who is here today to follow-up on her coronary artery disease and hypertension.   Previous NSTEMI in 02/2017 with left main normal, LAD with mild disease, D2 with moderate irregularities, left circumflex with mild disease, OM 250% ostial stenosis, RCA 95% proximal stenosis 100% mid stenosis with left-to-right collaterals. Recommended medical therapy.  Echocardiogram completed in 02/2017 revealed an LVEF of 55 to 60%, possible mild hypokinesis of the inferior myocardium, G1 DD, mild mitral regurgitation.  She underwent Lexiscan  Myoview  in 11/2021 which was considered a low risk study.  Event monitor in 11/2021 showed predominant underlying rhythm was sinus with an average heart rate of 72 bpm with 14 supraventricular tachycardia runs and rare PACs and PVCs.  She was seen in clinic 01/05/2023 by Dr. Cage.  At that time she had recent multiple hospitalizations for COPD exacerbation and findings of ectopy months ago.  She is on chronic oxygen  at home.  She was continued on her current medication regimen without any changes needed at that time.  She was seen in clinic 08/07/2023 stating she been doing well from a cardiac perspective.  She continues to follow with pulmonary and recently had started on medication that she stated made all the difference overall.  She was continued on chronic oxygen  therapy and 2 L of O2 via nasal cannula.  There were no medication changes made and further testing that was ordered at the time.   She was last  seen in clinic 02/26/2024 accompanied by her daughter.  She said for the cardiac perspective she been doing well.  She continue to follow with pulmonary and was recently sent to pulmonary rehab.  At that time they noted a jump in her blood pressures running 130s to 160s back to 130s.  She been unable to exercise at rehab due to elevated blood pressures.  She was started on losartan  12.5 mg daily with repeat BMP in 2 to 3 weeks to reevaluate kidney function.  There was no further testing that was ordered at that time.  She returns clinic today stating that overall she has been doing well from a cardiac perspective.  She denies any chest pain, worsening shortness breath, peripheral edema.  States that her blood pressure continues to be up and down that she has been taking her medication as prescribed.  She states she also has been under an increased amount of stress as of the lady worried about her daughter who has had to recently undergo medical procedures.  She denies any hospitalizations or visits to the emergency department.  ROS: 10 point review of systems has been reviewed and considered negative except ones been listed in the HPI  Studies Reviewed EKG Interpretation Date/Time:  Tuesday March 25 2024 14:08:34 EDT Ventricular Rate:  79 PR Interval:  144 QRS Duration:  70 QT Interval:  366 QTC Calculation: 419 R Axis:   79  Text Interpretation: Normal sinus rhythm Possible Left atrial enlargement Possible Anterior infarct , age undetermined When compared with ECG of 26-Feb-2024 13:53, No significant change was found Confirmed by Gerard Frederick ([id number])  on 03/25/2024 2:29:30 PM    Event Monitor ([employer]) 12/13/2021 Patient had a min HR of 55 bpm, max HR of 179 bpm, and avg HR of 72 bpm. Predominant underlying rhythm was Sinus Rhythm.  14 Supraventricular Tachycardia runs occurred, the run with the fastest interval lasting 5 beats with a max rate of 179 bpm, the longest lasting 14 beats with an avg  rate of 100 bpm. Rare PACs and rare PVCs.   Lexiscan  Myoview  11/15/2021   Normal pharmacologic myocardial perfusion stress test without evidence of significant ischemia or scar.   Left ventricular systolic function is normal.   Coronary artery and aortic valvular calcifications are noted, as well as aortic atherosclerosis.   Emphysematous changes are noted in the visualized lungs on the attenuation correction CT.   This is a low risk study.   2D echo 03/05/2017 Study Conclusions  - Left ventricle: The cavity size was normal. There was mild    concentric hypertrophy. Systolic function was normal. The    estimated ejection fraction was in the range of 55% to 60%.    Possible mild hypokinesis of the inferior myocardium. Doppler    parameters are consistent with abnormal left ventricular    relaxation (grade 1 diastolic dysfunction).  - Mitral valve: There was mild regurgitation.    LHC 03/05/2017 The left ventricular systolic function is normal. LV end diastolic pressure is mildly elevated. The left ventricular ejection fraction is 55-65% by visual estimate. Prox RCA lesion, 95 %stenosed. Mid RCA lesion, 100 %stenosed. Ost 2nd Mrg to 2nd Mrg lesion, 50 %stenosed.   1. Severe one-vessel coronary artery disease with chronically occluded right coronary artery with well-developed left-to-right collaterals. Mild to moderate nonobstructive disease affecting the left system with extremely tortuous coronary arteries. 2. Normal LV systolic function and mildly elevated left ventricular end-diastolic pressure.   Recommendations: Medical therapy. Risk Assessment/Calculations           Physical Exam VS:  BP 100/60 (BP Location: Left Arm, Patient Position: Sitting, Cuff Size: Small)   Pulse 79   Ht 3' 11 (1.194 m)   Wt 74 lb (33.6 kg)   SpO2 98%   BMI 23.55 kg/m        Wt Readings from Last 3 Encounters:  03/25/24 74 lb (33.6 kg)  03/25/24 73 lb 9.6 oz (33.4 kg)  02/26/24 74 lb 3.2 oz  (33.7 kg)    GEN: Well nourished, well developed in no acute distress NECK: No JVD; No carotid bruits CARDIAC: RRR, no murmurs, rubs, gallops RESPIRATORY:  Clear with diminished bases to auscultation without rales, wheezing or rhonchi  ABDOMEN: Soft, non-tender, non-distended EXTREMITIES:  No edema; No deformity   ASSESSMENT AND PLAN Coronary artery disease: Native coronary arteries without any anginal or anginal equivalents.  EKG today reveals sinus rhythm with a rate of 79 with an old anterior infarct possible left atrial enlargement with no significant or ischemic changes noted from prior studies.  She is continued on clopidogrel  75 mg daily long-term given her allergy to aspirin .  She is also continued on amlodipine  for antianginal effect.  No further ischemic testing is needed at this time.  Hypertension with a blood pressure today of 100/60.  She states her blood pressure has been upwards of 170 on occasion but has not stayed there.  She is concerned with shifts in blood pressure today.  She has been encouraged to keep a log at home 1 to 2 hours postmedication administration.  She is continued on amlodipine   2-1/2 mg tablets twice daily and bisoprolol  5 mg daily.  Previously she was on losartan  but was dropping her blood pressure too low so that had to be discontinued.  She is also encouraged to bring her monitor in with her when she returns to correlate her home cuff to a manual pressure.  Mixed hyperlipidemia with last LDL 159 with a goal of at least 70.  She continues to refuse statin medications.  She has upcoming labs ordered to determine LDL.  At that time we will revisit the possibility of bempedoic acid or PCSK9 inhibitor therapy.  Peripheral arterial disease with a AAA previously measuring 2.8 cm in diameter followed by vascular, carotid stenosis.  AAA which slightly increased in size now measuring 2.8 cm in diameter.  Continue to follow annually until it reaches 4 cm according to  vascular given her extremely small size they would consider fixing it at 4.5 cm versus 5 cm.  She continues with annual surveillance studies and ongoing management per [facility].  Carotid stenosis with a recent carotid duplex showing right carotid endarterectomy to be stable with 40-59% left ICA stenosis.  She is recommend to continue with yearly surveillance studies.  She is also continued on clopidogrel  75 mg daily.  Peripheral arterial disease with ABIs down to 0.7 on the right and 0.82 on the left but continues to have multiphasic waveforms.  She has palpable pulses without rest pain or ulcerations.  She is limited in her activity by her respiratory status.  This continues to be followed by [facility].  COPD requiring nocturnal oxygen  and ongoing tobacco use her COPD continues to be followed by pulmonary and for smoking total cessation continues to be recommended.       Dispo: Patient to return to clinic to see MD/APP in 6 weeks or sooner if needed for reevaluation of blood pressure and adjustments of medications as warranted.  Signed, Japji Kok, NP

## 2024-03-26 ENCOUNTER — Encounter

## 2024-03-31 ENCOUNTER — Encounter

## 2024-04-02 ENCOUNTER — Encounter

## 2024-04-02 DIAGNOSIS — M5416 Radiculopathy, lumbar region: Secondary | ICD-10-CM | POA: Diagnosis not present

## 2024-04-02 DIAGNOSIS — M5126 Other intervertebral disc displacement, lumbar region: Secondary | ICD-10-CM | POA: Diagnosis not present

## 2024-04-07 ENCOUNTER — Encounter

## 2024-04-08 DIAGNOSIS — M5416 Radiculopathy, lumbar region: Secondary | ICD-10-CM | POA: Diagnosis not present

## 2024-04-08 DIAGNOSIS — M5126 Other intervertebral disc displacement, lumbar region: Secondary | ICD-10-CM | POA: Diagnosis not present

## 2024-04-09 ENCOUNTER — Encounter

## 2024-04-14 ENCOUNTER — Encounter

## 2024-04-14 DIAGNOSIS — Z23 Encounter for immunization: Secondary | ICD-10-CM | POA: Diagnosis not present

## 2024-04-16 ENCOUNTER — Encounter

## 2024-04-17 ENCOUNTER — Encounter: Payer: Self-pay | Admitting: Nurse Practitioner

## 2024-04-17 ENCOUNTER — Ambulatory Visit (INDEPENDENT_AMBULATORY_CARE_PROVIDER_SITE_OTHER): Admitting: Nurse Practitioner

## 2024-04-17 VITALS — BP 110/60 | HR 90 | Temp 98.3°F | Resp 16 | Ht <= 58 in | Wt 74.2 lb

## 2024-04-17 DIAGNOSIS — D692 Other nonthrombocytopenic purpura: Secondary | ICD-10-CM

## 2024-04-17 DIAGNOSIS — I5032 Chronic diastolic (congestive) heart failure: Secondary | ICD-10-CM | POA: Diagnosis not present

## 2024-04-17 DIAGNOSIS — Z7189 Other specified counseling: Secondary | ICD-10-CM

## 2024-04-17 DIAGNOSIS — J9611 Chronic respiratory failure with hypoxia: Secondary | ICD-10-CM

## 2024-04-17 DIAGNOSIS — R5381 Other malaise: Secondary | ICD-10-CM

## 2024-04-17 DIAGNOSIS — Z66 Do not resuscitate: Secondary | ICD-10-CM | POA: Diagnosis not present

## 2024-04-17 DIAGNOSIS — J449 Chronic obstructive pulmonary disease, unspecified: Secondary | ICD-10-CM | POA: Diagnosis not present

## 2024-04-17 NOTE — Progress Notes (Signed)
 [facility] [address] [place], [place] [address]  Internal MEDICINE  Office Visit Note  Patient Name: Christina Hester  [id number]  [id number]  Date of Service: 04/17/2024  Chief Complaint  Patient presents with   Hyperlipidemia   Hypertension   Follow-up    Paperwork    HPI Christina Hester presents for a follow-up visit for advanced directives, end of life and hospice discussion.  The patient presents today with her daughter Christina Hester. She is requesting to discuss end of life, hospice and modifying code status. She brought the Medical Orders for Scope of Treatment form. We discussed each section of the form. The patient, Christina Hester, decided that she wants to change her code status to do not resuscitate. She would like limited medical interventions. She is ok with antibiotics if needed. She is also ok with IV fluids if indicated and a temporary feeding tube such as a nasogastric tube for short period of time in the treatment of an acute issues. She does not want any long term feeding tubes such as a PEG tube g-tube or j-tube or otherwise. The form was signed by the provider today and the patient.  Palliative care, concerns for hospice -- was being seen by palliative care previously but they have stopped coming to see her. She wants to be seen by palliative care again and requesting evaluation for hospice.  Advanced COPD -- this problem has continued to progress, she has been told by pulmonology that she is in the end stage. She is on ohtuvayre , brovana , prednisone  3 mg daily, duoneb treatments as needed and albuterol  inhaler as needed. Sees Dr. Aleskerov at [facility] clinic.  She sees cardiology for CAD, hypertension, AAA, carotid stenosis and HFpEF.     Current Medication: Outpatient Encounter Medications as of 04/17/2024  Medication Sig   amLODipine  (NORVASC ) 5 MG tablet Take 1 tablet (5 mg total) by mouth 2 (two) times daily.   arformoterol  (BROVANA ) 15 MCG/2ML NEBU  Inhale 15 mcg into the lungs.   azithromycin  (ZITHROMAX ) 250 MG tablet Take 1 tablet by mouth every Monday, Wednesday, and Friday.   bisoprolol  (ZEBETA ) 5 MG tablet Take 1 tablet (5 mg total) by mouth daily.   buPROPion  (WELLBUTRIN ) 100 MG tablet Take 1 tablet (100 mg total) by mouth daily.   clopidogrel  (PLAVIX ) 75 MG tablet Take 1 tablet (75 mg total) by mouth daily.   clotrimazole  (MYCELEX ) 10 MG troche Take 1 tablet (10 mg total) by mouth daily as needed (thrush).   Ensifentrine  (OHTUVAYRE ) 3 MG/2.5ML SUSP Inhale 3 mg into the lungs in the morning and at bedtime.   ipratropium-albuterol  (DUONEB) 0.5-2.5 (3) MG/3ML SOLN USE 1 VIAL IN NEBULIZER EVERY 4 HOURS   LORazepam  (ATIVAN ) 0.5 MG tablet TAKE 1/2 TO 1 TABLET BY MOUTH EVERY 8 HOURS AS NEEDED FOR ANXIETY OR SLEEP   meloxicam  (MOBIC ) 15 MG tablet Take 1 tablet (15 mg total) by mouth daily. (Patient taking differently: Take 7.5 mg by mouth daily as needed.)   nitroGLYCERIN  (NITROSTAT ) 0.4 MG SL tablet Place 1 tablet (0.4 mg total) under the tongue every 5 (five) minutes as needed for chest pain.   nystatin  (MYCOSTATIN ) 100000 UNIT/ML suspension Take 5 mLs (500,000 Units total) by mouth 4 (four) times daily. Until thrush is resolved. May keep as needed for recurrences.   ondansetron  (ZOFRAN ) 4 MG tablet Take 1 tablet (4 mg total) by mouth every 8 (eight) hours as needed for nausea or vomiting.   pantoprazole  (PROTONIX ) 40  MG tablet Take 1 tablet (40 mg total) by mouth daily.   predniSONE  (DELTASONE ) 1 MG tablet Take 3 mg by mouth daily with breakfast.   rOPINIRole  (REQUIP ) 0.25 MG tablet Take 1 tablet (0.25 mg total) by mouth at bedtime.   Spacer/Aero-Holding Chambers (EASIVENT) inhaler See admin instructions.   VENTOLIN  HFA 108 (90 Base) MCG/ACT inhaler INHALE 2 PUFFS INTO THE LUNGS EVERY 6 HOURS AS NEEDED FOR WHEEZING OR SHORTNESS OF BREATH.   [DISCONTINUED] losartan  (COZAAR ) 25 MG tablet Take 0.5 tablets (12.5 mg total) by mouth daily.   No  facility-administered encounter medications on file as of 04/17/2024.    Surgical History: Past Surgical History:  Procedure Laterality Date   ABDOMINAL HYSTERECTOMY     APPENDECTOMY     BREAST EXCISIONAL BIOPSY Left 80's or 90's   NEG   CARDIAC CATHETERIZATION     COLONOSCOPY N/A 05/22/2018   Procedure: COLONOSCOPY;  Surgeon: Unk Corinn Skiff, MD;  Location: [facility] ENDOSCOPY;  Service: Gastroenterology;  Laterality: N/A;   COLONOSCOPY WITH PROPOFOL  N/A 07/19/2020   Procedure: COLONOSCOPY WITH PROPOFOL ;  Surgeon: Toledo, Ladell POUR, MD;  Location: [facility] ENDOSCOPY;  Service: Gastroenterology;  Laterality: N/A;   ENDARTERECTOMY Right 11/06/2019   Procedure: ENDARTERECTOMY CAROTID;  Surgeon: Marea Selinda RAMAN, MD;  Location: [facility] ORS;  Service: Vascular;  Laterality: Right;   LEFT HEART CATH Bilateral 03/05/2017   Procedure: Left Heart Cath with poss PCI;  Surgeon: Darron Deatrice LABOR, MD;  Location: [facility] INVASIVE CV LAB;  Service: Cardiovascular;  Laterality: Bilateral;    Medical History: Past Medical History:  Diagnosis Date   AAA (abdominal aortic aneurysm)    a. 05/2016 Abd U/S: 3.02 x 3.02 AAA.   Anxiety    CAD (coronary artery disease)    a. 02/2017 NSTEMI/Cath: LM nl, LAD mild dzs, D2 min irregs, LCX mild dzs, OM2 50ost, RCA 95p, 151m, L-->R collats, EF 55-65%.   COPD (chronic obstructive pulmonary disease) (HCC)    Diastolic dysfunction    a. 02/2017 Echo: EF 55-60%, gr1 DD, ? inf HK, mild MR.   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 05/2016 ABI's: R 1.22, L 1.11.   Tobacco abuse     Family History: Family History  Problem Relation Age of Onset   Breast cancer Mother    Dementia Mother    Hypertension Mother    Breast cancer Maternal Aunt    Breast cancer Maternal Aunt    Breast cancer Maternal Aunt    CVA Father     Social History    Socioeconomic History   Marital status: Divorced    Spouse name: Not on file   Number of children: Not on file   Years of education: Not on file   Highest education level: Not on file  Occupational History   Not on file  Tobacco Use   Smoking status: Every Day    Current packs/day: 0.25    Average packs/day: 0.9 packs/day for 52.7 years (46.9 ttl pk-yrs)    Types: Cigarettes    Start date: 09/13/1971    Passive exposure: Current   Smokeless tobacco: Never  Vaping Use   Vaping status: Never Used  Substance and Sexual Activity   Alcohol use: No   Drug use: No   Sexual activity: Not Currently  Other Topics Concern   Not on file  Social History Narrative   Not on file   Social Drivers of Health   Financial Resource Strain: Low Risk  (03/05/2024)   Received from [facility]   Overall Financial Resource Strain (CARDIA)    Difficulty of Paying Living Expenses: Not hard at all  Food Insecurity: No Food Insecurity (03/05/2024)   Received from [facility]   Hunger Vital Sign    Within the past 12 months, you worried that your food would run out before you got the money to buy more.: Never true    Within the past 12 months, the food you bought just didn't last and you didn't have money to get more.: Never true  Transportation Needs: No Transportation Needs (03/05/2024)   Received from [facility] - Transportation    In the past 12 months, has lack of transportation kept you from medical appointments or from getting medications?: No    Lack of Transportation (Non-Medical): No  Physical Activity: Not on file  Stress: Not on file  Social Connections: Not on file  Intimate Partner Violence: Not At Risk (11/26/2022)   Humiliation, Afraid, Rape, and Kick questionnaire    Fear of Current or Ex-Partner: No    Emotionally Abused: No    Physically Abused: No    Sexually Abused: No      Review of Systems   Constitutional:  Positive for activity change, appetite change and fatigue.  HENT: Negative.    Respiratory:  Positive for cough, chest tightness (intermittent), shortness of breath (at baseline) and wheezing (intermittent).        Persistent symptoms due to advanced COPD  Cardiovascular: Negative.  Negative for chest pain and palpitations.  Gastrointestinal: Negative.  Negative for abdominal pain.  Musculoskeletal:  Positive for arthralgias, back pain, gait problem and myalgias.  Neurological:  Positive for weakness. Negative for headaches.  Hematological:  Negative for adenopathy. Does not bruise/bleed easily.  Psychiatric/Behavioral:  Positive for behavioral problems and sleep disturbance. Negative for self-injury and suicidal ideas. The patient is nervous/anxious.     Vital Signs: BP 110/60   Pulse 90   Temp 98.3 F (36.8 C)   Resp 16   Ht 3' 11 (1.194 m)   Wt 74 lb 3.2 oz (33.7 kg)   SpO2 94%   BMI 23.62 kg/m    Physical Exam Vitals reviewed.  Constitutional:      General: She is not in acute distress.    Appearance: Normal appearance. She is not ill-appearing.  HENT:     Head: Normocephalic and atraumatic.  Eyes:     Pupils: Pupils are equal, round, and reactive to light.  Cardiovascular:     Rate and Rhythm: Normal rate and regular rhythm.  Pulmonary:     Effort: Pulmonary effort is normal. No accessory muscle usage or respiratory distress.     Breath sounds: Decreased air movement present. Examination of the right-upper field reveals wheezing. Examination of the left-upper field reveals wheezing. Examination of the right-middle field reveals decreased breath sounds. Examination of the left-middle field reveals decreased breath sounds. Examination of the right-lower field reveals decreased breath sounds. Examination of the left-lower field reveals decreased breath sounds. Decreased breath sounds and wheezing present.  Neurological:     Mental Status: She is alert and  oriented to person, place, and time.  Psychiatric:        Mood and Affect: Mood normal.  Behavior: Behavior normal.        Assessment/Plan: 1. Advanced COPD (HCC) (Primary) Referred to palliative care for further assistance and a possible evaluation for hospice - Amb Referral to Palliative Care  2. Chronic respiratory failure with hypoxia (HCC) Continue using continuous supplemental oxygen  as instructed and continue follow up with Dr. Aleskerov.  3. Chronic heart failure with preserved ejection fraction (HFpEF) (HCC) Continue follow up with cardiology   4. Declining functional status Referred to palliative care for further assistance and possible evaluation for hospice. Code status changed to DNR.  - Amb Referral to Palliative Care - Do not attempt resuscitation (DNR)  5. Purpura senilis Note, easy bruising, thin skin, followed by cardiology and vascular surgery.   6. Do not intubate, perform CPR, or defibrillate Referred to palliative care for further assistance and possible evaluation for hospice. Code status changed to DNR.  - Amb Referral to Palliative Care - Do not attempt resuscitation (DNR)  7. Advanced care planning/counseling discussion Discussion held with the patient and her daughter, Christina Hester. The patient's wishes are listed in detail in the HPI above. Her code status was changed to DNR per the patient's wishes. She was also referred to palliative care again for further assistance and possible evaluation for hospice.    General Counseling: Christina Hester verbalizes understanding of the findings of todays visit and agrees with plan of treatment. I have discussed any further diagnostic evaluation that may be needed or ordered today. We also reviewed her medications today. she has been encouraged to call the office with any questions or concerns that should arise related to todays visit.    Orders Placed This Encounter  Procedures   Amb Referral to Palliative  Care   Do not attempt resuscitation (DNR)    No orders of the defined types were placed in this encounter.   Return for previously scheduled appt in december, with Maili Shutters pcp.   Total time spent:30 Minutes Time spent includes review of chart, medications, test results, and follow up plan with the patient.   [place] Controlled Substance Database was reviewed by me.  This patient was seen by Mardy Maxin, FNP-C in collaboration with Dr. Sigrid Bathe as a part of collaborative care agreement.   Christina Younker R. Maxin, MSN, FNP-C Internal medicine

## 2024-04-21 ENCOUNTER — Encounter

## 2024-04-23 ENCOUNTER — Encounter

## 2024-04-28 ENCOUNTER — Encounter

## 2024-04-30 ENCOUNTER — Encounter

## 2024-04-30 DIAGNOSIS — M62830 Muscle spasm of back: Secondary | ICD-10-CM | POA: Diagnosis not present

## 2024-04-30 DIAGNOSIS — M546 Pain in thoracic spine: Secondary | ICD-10-CM | POA: Diagnosis not present

## 2024-04-30 DIAGNOSIS — M5414 Radiculopathy, thoracic region: Secondary | ICD-10-CM | POA: Diagnosis not present

## 2024-04-30 DIAGNOSIS — M48062 Spinal stenosis, lumbar region with neurogenic claudication: Secondary | ICD-10-CM | POA: Diagnosis not present

## 2024-04-30 DIAGNOSIS — M5416 Radiculopathy, lumbar region: Secondary | ICD-10-CM | POA: Diagnosis not present

## 2024-05-02 DIAGNOSIS — J439 Emphysema, unspecified: Secondary | ICD-10-CM | POA: Diagnosis not present

## 2024-05-02 DIAGNOSIS — Z90710 Acquired absence of both cervix and uterus: Secondary | ICD-10-CM | POA: Diagnosis not present

## 2024-05-02 DIAGNOSIS — F32A Depression, unspecified: Secondary | ICD-10-CM | POA: Diagnosis not present

## 2024-05-02 DIAGNOSIS — M545 Low back pain, unspecified: Secondary | ICD-10-CM | POA: Diagnosis not present

## 2024-05-02 DIAGNOSIS — G8929 Other chronic pain: Secondary | ICD-10-CM | POA: Diagnosis not present

## 2024-05-02 DIAGNOSIS — Z792 Long term (current) use of antibiotics: Secondary | ICD-10-CM | POA: Diagnosis not present

## 2024-05-02 DIAGNOSIS — F419 Anxiety disorder, unspecified: Secondary | ICD-10-CM | POA: Diagnosis not present

## 2024-05-02 DIAGNOSIS — J4489 Other specified chronic obstructive pulmonary disease: Secondary | ICD-10-CM | POA: Diagnosis not present

## 2024-05-02 DIAGNOSIS — M40209 Unspecified kyphosis, site unspecified: Secondary | ICD-10-CM | POA: Diagnosis not present

## 2024-05-02 DIAGNOSIS — Z7952 Long term (current) use of systemic steroids: Secondary | ICD-10-CM | POA: Diagnosis not present

## 2024-05-02 DIAGNOSIS — Z7902 Long term (current) use of antithrombotics/antiplatelets: Secondary | ICD-10-CM | POA: Diagnosis not present

## 2024-05-02 DIAGNOSIS — I10 Essential (primary) hypertension: Secondary | ICD-10-CM | POA: Diagnosis not present

## 2024-05-02 DIAGNOSIS — M810 Age-related osteoporosis without current pathological fracture: Secondary | ICD-10-CM | POA: Diagnosis not present

## 2024-05-02 DIAGNOSIS — F17210 Nicotine dependence, cigarettes, uncomplicated: Secondary | ICD-10-CM | POA: Diagnosis not present

## 2024-05-02 DIAGNOSIS — I719 Aortic aneurysm of unspecified site, without rupture: Secondary | ICD-10-CM | POA: Diagnosis not present

## 2024-05-02 DIAGNOSIS — I2510 Atherosclerotic heart disease of native coronary artery without angina pectoris: Secondary | ICD-10-CM | POA: Diagnosis not present

## 2024-05-05 ENCOUNTER — Encounter

## 2024-05-06 ENCOUNTER — Encounter: Payer: Self-pay | Admitting: Cardiology

## 2024-05-06 ENCOUNTER — Ambulatory Visit: Attending: Cardiology | Admitting: Cardiology

## 2024-05-06 VITALS — BP 117/60 | HR 79 | Ht 60.0 in | Wt 74.0 lb

## 2024-05-06 DIAGNOSIS — Z720 Tobacco use: Secondary | ICD-10-CM | POA: Insufficient documentation

## 2024-05-06 DIAGNOSIS — I739 Peripheral vascular disease, unspecified: Secondary | ICD-10-CM | POA: Diagnosis not present

## 2024-05-06 DIAGNOSIS — I714 Abdominal aortic aneurysm, without rupture, unspecified: Secondary | ICD-10-CM | POA: Diagnosis not present

## 2024-05-06 DIAGNOSIS — I2510 Atherosclerotic heart disease of native coronary artery without angina pectoris: Secondary | ICD-10-CM | POA: Diagnosis not present

## 2024-05-06 DIAGNOSIS — E782 Mixed hyperlipidemia: Secondary | ICD-10-CM | POA: Insufficient documentation

## 2024-05-06 DIAGNOSIS — J449 Chronic obstructive pulmonary disease, unspecified: Secondary | ICD-10-CM | POA: Diagnosis not present

## 2024-05-06 DIAGNOSIS — I10 Essential (primary) hypertension: Secondary | ICD-10-CM | POA: Diagnosis not present

## 2024-05-06 NOTE — Progress Notes (Unsigned)
 Cardiology Office Note   Date:  05/08/2024  ID:  Christina Hester, DOB [birth year], MRN [id number] PCP: Liana Fish, NP  [facility] Providers Cardiologist:  Deatrice Cage, MD     History of Present Illness ADDI PAK is a 83 y.o. female with a past medical history of coronary artery disease, COPD, prolonged tobacco use, moderate bilateral carotid disease, abdominal aortic aneurysm followed by VVS, hypertension, HFpEF, peripheral vascular disease, hyperlipidemia, who is here today for follow-up of coronary artery disease and hypertension.   Previous NSTEMI in 02/2017 with left main normal, LAD with mild disease, D2 with moderate irregularities, left circumflex with mild disease, OM 250% ostial stenosis, RCA 95% proximal stenosis 100% mid stenosis with left-to-right collaterals. Recommended medical therapy.  Echocardiogram completed in 02/2017 revealed an LVEF of 55 to 60%, possible mild hypokinesis of the inferior myocardium, G1 DD, mild mitral regurgitation.  She underwent Lexiscan  Myoview  in 11/2021 which was considered a low risk study.  Event monitor in 11/2021 showed predominant underlying rhythm was sinus with an average heart rate of 72 bpm with 14 supraventricular tachycardia runs and rare PACs and PVCs.  She was seen in clinic 01/05/2023 by Dr. Cage.  At that time she had recent multiple hospitalizations for COPD exacerbation and findings of ectopy months ago.  She is on chronic oxygen  at home.  She was continued on her current medication regimen without any changes needed at that time.  She was seen in clinic 08/07/2023 stating she been doing well from a cardiac perspective.  She continues to follow with pulmonary and recently had started on medication that she stated made all the difference overall.  She was continued on chronic oxygen  therapy and 2 L of O2 via nasal cannula.  There were no medication changes made and further testing that was ordered at the time.  She was  seen in clinic 02/26/2024 accompanied by her daughter.  For the cardiac perspective she was doing well.  She continue to follow with pulmonary recently been sent to pulmonary rehab.  At that time they noted a drop in her blood pressure running 130-160 about 130.  She was unable to exercise at rehab due to elevated blood pressures.  She was restarted on losartan  12.5 mg daily with repeat BMP in 2 to 3 weeks to reevaluate kidney function.  No further testing was ordered at that time.   She was last seen in clinic 03/25/2024 doing well from a cardiac perspective.  Denied any chest pain, worsening shortness of breath or peripheral edema.  Blood pressure continued to be up-and-down and she was taking her medication as prescribed.  Stated she had been under an increased amount of stress as she worried about her daughter who recently had to undergo medical procedures.  Blood pressure at her appointment was 100/60.  She was continued to bring her cuff with her to her appointment and losartan  had previously been discontinued due to drops in blood pressure.  She returns to clinic today accompanied by her daughter.  She continues to have complaints of shortness of breath that she states is unchanged.  Denies any chest pain or peripheral edema.  States that she continues to have ups and downs with her blood pressure.  States that she has been under an increased amount of stress and is worried about her daughter who continues to have a large amount of medical problems.  States that she has been compliant with her current medication regimen.  She has followed  up with pulmonary and has questions today about how to go about getting the placed on a BiPAP machine at home to assist with some of her breathing issues.  She denies any recent hospitalizations or visits to the emergency department.  ROS: 10 point review of system has been reviewed and considered negative except ones been listed in the HPI  Studies Reviewed EKG  Interpretation Date/Time:  Tuesday May 06 2024 10:12:47 EDT Ventricular Rate:  79 PR Interval:  148 QRS Duration:  64 QT Interval:  360 QTC Calculation: 412 R Axis:   76  Text Interpretation: Normal sinus rhythm When compared with ECG of 25-Mar-2024 14:08, No significant change was found Confirmed by Gerard Frederick ([id number]) on 05/06/2024 10:18:23 AM    Event Monitor ([employer]) 12/13/2021 Patient had a min HR of 55 bpm, max HR of 179 bpm, and avg HR of 72 bpm. Predominant underlying rhythm was Sinus Rhythm.  14 Supraventricular Tachycardia runs occurred, the run with the fastest interval lasting 5 beats with a max rate of 179 bpm, the longest lasting 14 beats with an avg rate of 100 bpm. Rare PACs and rare PVCs.   Lexiscan  Myoview  11/15/2021   Normal pharmacologic myocardial perfusion stress test without evidence of significant ischemia or scar.   Left ventricular systolic function is normal.   Coronary artery and aortic valvular calcifications are noted, as well as aortic atherosclerosis.   Emphysematous changes are noted in the visualized lungs on the attenuation correction CT.   This is a low risk study.   2D echo 03/05/2017 Study Conclusions  - Left ventricle: The cavity size was normal. There was mild    concentric hypertrophy. Systolic function was normal. The    estimated ejection fraction was in the range of 55% to 60%.    Possible mild hypokinesis of the inferior myocardium. Doppler    parameters are consistent with abnormal left ventricular    relaxation (grade 1 diastolic dysfunction).  - Mitral valve: There was mild regurgitation.    LHC 03/05/2017 The left ventricular systolic function is normal. LV end diastolic pressure is mildly elevated. The left ventricular ejection fraction is 55-65% by visual estimate. Prox RCA lesion, 95 %stenosed. Mid RCA lesion, 100 %stenosed. Ost 2nd Mrg to 2nd Mrg lesion, 50 %stenosed.   1. Severe one-vessel coronary artery disease with  chronically occluded right coronary artery with well-developed left-to-right collaterals. Mild to moderate nonobstructive disease affecting the left system with extremely tortuous coronary arteries. 2. Normal LV systolic function and mildly elevated left ventricular end-diastolic pressure.   Recommendations: Medical therapy. Risk Assessment/Calculations     Physical Exam VS:  BP 117/60 (BP Location: Left Arm, Patient Position: Sitting, Cuff Size: Small)   Pulse 79 Comment: 82 oximeter  Ht 5' (1.524 m)   Wt 74 lb (33.6 kg)   SpO2 96%   BMI 14.45 kg/m        Wt Readings from Last 3 Encounters:  05/06/24 74 lb (33.6 kg)  04/17/24 74 lb 3.2 oz (33.7 kg)  03/25/24 74 lb (33.6 kg)    GEN: Well nourished, well developed in no acute distress NECK: No JVD; No carotid bruits CARDIAC: RRR, no murmurs, rubs, gallops RESPIRATORY: Scattered rhonchi throughout respirations are unlabored at rest on room air ABDOMEN: Soft, non-tender, non-distended EXTREMITIES:  No edema; No deformity   ASSESSMENT AND PLAN Coronary artery disease of native coronary arteries without angina.  EKG today reveals sinus rhythm with a rate of 79 with no acute ischemic changes  noted.  She is continued on clopidogrel  75 mg daily that is indefinite given her allergy to aspirin .  She is continued on amlodipine  for antianginal effect.  No further ischemic testing is needed at this time.  Hypertension with a blood pressure today 117/60.  Blood pressure continues to be up and down at home.  She has been advised to continue with her current medication regimen of amlodipine  2.5 mg twice daily buspirone 5 mg daily.  Previously was on losartan  but with dropping blood pressure was discontinued previously.  She has been encouraged to continue to monitor pressures 1 to 2 hours postmedication administration at home as well.  Mixed hyperlipidemia with an LDL of 159 with goal below 70.  She continues to refuse statin  medications.  Peripheral arterial disease with AAA followed by VVS.  AAA was slightly increased in size now measuring 2.8 cm in diameter.  Continue to follow annually until reaches 4 cm according to vascular given her extremely small size they would consider fixing it at 4.5 cm versus 5 cm.  Continue with annual surveillance.  Peripheral arterial disease with ABIs down to 0.7 on the right 0.82 on the left but continues to have multiphasic waveforms.  She has palpable pulses without rest pain or ulcerations to her bilateral lower extremities.  She has limited activity due to her respiratory status her peripheral arterial disease continues to be followed by VVS.  She remains on clopidogrel  75 mg daily.  Updated scans have already been ordered for February 2026.  COPD requiring nocturnal oxygen  therapy with ongoing tobacco use with COPD continues to be followed by pulmonary.  Total cessation continues to be recommended.  She does have questions about BiPAP therapy today will reach out to pulmonary about potentially getting supplies sent to the house as she has several questions and is willing to try therapy at home again.  Patient did ask if wearing BiPAP therapy at home would extend her life patient has been advised unfortunately therapy would likely not extend her life but would make the life that she has left easier with her breathing at home to some extent and may be beneficial for her to try as pulmonary has advised her previously.       Dispo: Patient to return to clinic to see MD/APP in 2 months or sooner if needed for reevaluation.  Signed, Grafton Warzecha, NP

## 2024-05-06 NOTE — Patient Instructions (Signed)
 Medication Instructions:  Your physician recommends the following medication changes.  STOP TAKING: Losartan   *If you need a refill on your cardiac medications before your next appointment, please call your pharmacy*  Lab Work: No labs ordered today  If you have labs (blood work) drawn today and your tests are completely normal, you will receive your results only by: MyChart Message (if you have MyChart) OR A paper copy in the mail If you have any lab test that is abnormal or we need to change your treatment, we will call you to review the results.  Testing/Procedures: No test ordered today   Follow-Up: At [facility], you and your health needs are our priority.  As part of our continuing mission to provide you with exceptional heart care, our providers are all part of one team.  This team includes your primary Cardiologist (physician) and Advanced Practice Providers or APPs (Physician Assistants and Nurse Practitioners) who all work together to provide you with the care you need, when you need it.  Your next appointment:   2 month(s)  Provider:   You may see Deatrice Cage, MD or one of the following Advanced Practice Providers on your designated Care Team:   Lonni Meager, NP Lesley Maffucci, PA-C Bernardino Bring, PA-C Cadence [place], PA-C Tylene Lunch, NP Barnie Hila, NP    We recommend signing up for the patient portal called MyChart.  Sign up information is provided on this After Visit Summary.  MyChart is used to connect with patients for Virtual Visits (Telemedicine).  Patients are able to view lab/test results, encounter notes, upcoming appointments, etc.  Non-urgent messages can be sent to your provider as well.   To learn more about what you can do with MyChart, go to [email or web address].

## 2024-05-07 ENCOUNTER — Encounter

## 2024-05-07 DIAGNOSIS — M40209 Unspecified kyphosis, site unspecified: Secondary | ICD-10-CM | POA: Diagnosis not present

## 2024-05-07 DIAGNOSIS — J4489 Other specified chronic obstructive pulmonary disease: Secondary | ICD-10-CM | POA: Diagnosis not present

## 2024-05-07 DIAGNOSIS — M810 Age-related osteoporosis without current pathological fracture: Secondary | ICD-10-CM | POA: Diagnosis not present

## 2024-05-07 DIAGNOSIS — I2510 Atherosclerotic heart disease of native coronary artery without angina pectoris: Secondary | ICD-10-CM | POA: Diagnosis not present

## 2024-05-07 DIAGNOSIS — I719 Aortic aneurysm of unspecified site, without rupture: Secondary | ICD-10-CM | POA: Diagnosis not present

## 2024-05-07 DIAGNOSIS — J439 Emphysema, unspecified: Secondary | ICD-10-CM | POA: Diagnosis not present

## 2024-05-08 ENCOUNTER — Encounter: Payer: Self-pay | Admitting: Cardiology

## 2024-05-09 DIAGNOSIS — M810 Age-related osteoporosis without current pathological fracture: Secondary | ICD-10-CM | POA: Diagnosis not present

## 2024-05-09 DIAGNOSIS — I2510 Atherosclerotic heart disease of native coronary artery without angina pectoris: Secondary | ICD-10-CM | POA: Diagnosis not present

## 2024-05-09 DIAGNOSIS — J4489 Other specified chronic obstructive pulmonary disease: Secondary | ICD-10-CM | POA: Diagnosis not present

## 2024-05-09 DIAGNOSIS — M40209 Unspecified kyphosis, site unspecified: Secondary | ICD-10-CM | POA: Diagnosis not present

## 2024-05-09 DIAGNOSIS — J439 Emphysema, unspecified: Secondary | ICD-10-CM | POA: Diagnosis not present

## 2024-05-09 DIAGNOSIS — I719 Aortic aneurysm of unspecified site, without rupture: Secondary | ICD-10-CM | POA: Diagnosis not present

## 2024-05-12 ENCOUNTER — Telehealth: Payer: Self-pay | Admitting: Nurse Practitioner

## 2024-05-12 ENCOUNTER — Encounter

## 2024-05-12 DIAGNOSIS — M810 Age-related osteoporosis without current pathological fracture: Secondary | ICD-10-CM | POA: Diagnosis not present

## 2024-05-12 DIAGNOSIS — M40209 Unspecified kyphosis, site unspecified: Secondary | ICD-10-CM | POA: Diagnosis not present

## 2024-05-12 DIAGNOSIS — I2510 Atherosclerotic heart disease of native coronary artery without angina pectoris: Secondary | ICD-10-CM | POA: Diagnosis not present

## 2024-05-12 DIAGNOSIS — I719 Aortic aneurysm of unspecified site, without rupture: Secondary | ICD-10-CM | POA: Diagnosis not present

## 2024-05-12 DIAGNOSIS — J4489 Other specified chronic obstructive pulmonary disease: Secondary | ICD-10-CM | POA: Diagnosis not present

## 2024-05-12 DIAGNOSIS — J439 Emphysema, unspecified: Secondary | ICD-10-CM | POA: Diagnosis not present

## 2024-05-12 NOTE — Telephone Encounter (Signed)
 Received Albuterol  order from [employer]. Gave to [name] for signature-Toni

## 2024-05-14 ENCOUNTER — Encounter

## 2024-05-14 DIAGNOSIS — M810 Age-related osteoporosis without current pathological fracture: Secondary | ICD-10-CM | POA: Diagnosis not present

## 2024-05-14 DIAGNOSIS — J439 Emphysema, unspecified: Secondary | ICD-10-CM | POA: Diagnosis not present

## 2024-05-14 DIAGNOSIS — J4489 Other specified chronic obstructive pulmonary disease: Secondary | ICD-10-CM | POA: Diagnosis not present

## 2024-05-14 DIAGNOSIS — I2510 Atherosclerotic heart disease of native coronary artery without angina pectoris: Secondary | ICD-10-CM | POA: Diagnosis not present

## 2024-05-14 DIAGNOSIS — I719 Aortic aneurysm of unspecified site, without rupture: Secondary | ICD-10-CM | POA: Diagnosis not present

## 2024-05-14 DIAGNOSIS — M40209 Unspecified kyphosis, site unspecified: Secondary | ICD-10-CM | POA: Diagnosis not present

## 2024-05-19 ENCOUNTER — Encounter

## 2024-05-19 DIAGNOSIS — J449 Chronic obstructive pulmonary disease, unspecified: Secondary | ICD-10-CM | POA: Diagnosis not present

## 2024-05-19 DIAGNOSIS — R0902 Hypoxemia: Secondary | ICD-10-CM | POA: Diagnosis not present

## 2024-05-19 DIAGNOSIS — R0609 Other forms of dyspnea: Secondary | ICD-10-CM | POA: Diagnosis not present

## 2024-05-19 DIAGNOSIS — Z9981 Dependence on supplemental oxygen: Secondary | ICD-10-CM | POA: Diagnosis not present

## 2024-05-20 ENCOUNTER — Other Ambulatory Visit: Payer: Self-pay | Admitting: Emergency Medicine

## 2024-05-20 DIAGNOSIS — M810 Age-related osteoporosis without current pathological fracture: Secondary | ICD-10-CM | POA: Diagnosis not present

## 2024-05-20 DIAGNOSIS — J449 Chronic obstructive pulmonary disease, unspecified: Secondary | ICD-10-CM

## 2024-05-20 DIAGNOSIS — I2510 Atherosclerotic heart disease of native coronary artery without angina pectoris: Secondary | ICD-10-CM | POA: Diagnosis not present

## 2024-05-20 DIAGNOSIS — J439 Emphysema, unspecified: Secondary | ICD-10-CM | POA: Diagnosis not present

## 2024-05-20 DIAGNOSIS — M40209 Unspecified kyphosis, site unspecified: Secondary | ICD-10-CM | POA: Diagnosis not present

## 2024-05-20 DIAGNOSIS — J4489 Other specified chronic obstructive pulmonary disease: Secondary | ICD-10-CM | POA: Diagnosis not present

## 2024-05-20 DIAGNOSIS — I719 Aortic aneurysm of unspecified site, without rupture: Secondary | ICD-10-CM | POA: Diagnosis not present

## 2024-05-21 ENCOUNTER — Encounter

## 2024-05-22 ENCOUNTER — Other Ambulatory Visit: Payer: Self-pay | Admitting: Respiratory Therapy

## 2024-05-22 ENCOUNTER — Encounter: Payer: Self-pay | Admitting: Emergency Medicine

## 2024-05-22 ENCOUNTER — Ambulatory Visit: Attending: Emergency Medicine

## 2024-05-22 ENCOUNTER — Other Ambulatory Visit: Payer: Self-pay | Admitting: Emergency Medicine

## 2024-05-22 ENCOUNTER — Encounter: Payer: Self-pay | Admitting: Nurse Practitioner

## 2024-05-22 DIAGNOSIS — R5381 Other malaise: Secondary | ICD-10-CM | POA: Insufficient documentation

## 2024-05-22 DIAGNOSIS — J449 Chronic obstructive pulmonary disease, unspecified: Secondary | ICD-10-CM

## 2024-05-22 DIAGNOSIS — Z66 Do not resuscitate: Secondary | ICD-10-CM | POA: Insufficient documentation

## 2024-05-22 DIAGNOSIS — J9611 Chronic respiratory failure with hypoxia: Secondary | ICD-10-CM | POA: Insufficient documentation

## 2024-05-22 LAB — BLOOD GAS, ARTERIAL
Acid-Base Excess: 10.7 mmol/L — ABNORMAL HIGH (ref 0.0–2.0)
Bicarbonate: 35.7 mmol/L — ABNORMAL HIGH (ref 20.0–28.0)
FIO2: 0.28 %
O2 Saturation: 96.4 %
Patient temperature: 37
pCO2 arterial: 48 mmHg (ref 32–48)
pH, Arterial: 7.48 — ABNORMAL HIGH (ref 7.35–7.45)
pO2, Arterial: 69 mmHg — ABNORMAL LOW (ref 83–108)

## 2024-05-23 DIAGNOSIS — M40209 Unspecified kyphosis, site unspecified: Secondary | ICD-10-CM | POA: Diagnosis not present

## 2024-05-23 DIAGNOSIS — J439 Emphysema, unspecified: Secondary | ICD-10-CM | POA: Diagnosis not present

## 2024-05-23 DIAGNOSIS — I2510 Atherosclerotic heart disease of native coronary artery without angina pectoris: Secondary | ICD-10-CM | POA: Diagnosis not present

## 2024-05-23 DIAGNOSIS — M810 Age-related osteoporosis without current pathological fracture: Secondary | ICD-10-CM | POA: Diagnosis not present

## 2024-05-23 DIAGNOSIS — J4489 Other specified chronic obstructive pulmonary disease: Secondary | ICD-10-CM | POA: Diagnosis not present

## 2024-05-23 DIAGNOSIS — I719 Aortic aneurysm of unspecified site, without rupture: Secondary | ICD-10-CM | POA: Diagnosis not present

## 2024-05-26 ENCOUNTER — Encounter

## 2024-05-27 ENCOUNTER — Other Ambulatory Visit: Payer: Self-pay

## 2024-05-27 DIAGNOSIS — J449 Chronic obstructive pulmonary disease, unspecified: Secondary | ICD-10-CM

## 2024-05-27 MED ORDER — IPRATROPIUM-ALBUTEROL 0.5-2.5 (3) MG/3ML IN SOLN: RESPIRATORY_TRACT | 3 refills | Status: DC

## 2024-05-28 ENCOUNTER — Encounter

## 2024-05-29 ENCOUNTER — Telehealth: Payer: Self-pay | Admitting: Nurse Practitioner

## 2024-05-29 DIAGNOSIS — I719 Aortic aneurysm of unspecified site, without rupture: Secondary | ICD-10-CM | POA: Diagnosis not present

## 2024-05-29 DIAGNOSIS — J439 Emphysema, unspecified: Secondary | ICD-10-CM | POA: Diagnosis not present

## 2024-05-29 DIAGNOSIS — M40209 Unspecified kyphosis, site unspecified: Secondary | ICD-10-CM | POA: Diagnosis not present

## 2024-05-29 DIAGNOSIS — I2510 Atherosclerotic heart disease of native coronary artery without angina pectoris: Secondary | ICD-10-CM | POA: Diagnosis not present

## 2024-05-29 DIAGNOSIS — J4489 Other specified chronic obstructive pulmonary disease: Secondary | ICD-10-CM | POA: Diagnosis not present

## 2024-05-29 DIAGNOSIS — M810 Age-related osteoporosis without current pathological fracture: Secondary | ICD-10-CM | POA: Diagnosis not present

## 2024-05-29 NOTE — Telephone Encounter (Signed)
 Albuterol  order signed. Faxed back to [facility]; [phone]. Scanned-Toni

## 2024-05-30 ENCOUNTER — Ambulatory Visit: Admitting: Cardiology

## 2024-06-01 DIAGNOSIS — J439 Emphysema, unspecified: Secondary | ICD-10-CM | POA: Diagnosis not present

## 2024-06-01 DIAGNOSIS — I10 Essential (primary) hypertension: Secondary | ICD-10-CM | POA: Diagnosis not present

## 2024-06-01 DIAGNOSIS — Z792 Long term (current) use of antibiotics: Secondary | ICD-10-CM | POA: Diagnosis not present

## 2024-06-01 DIAGNOSIS — M545 Low back pain, unspecified: Secondary | ICD-10-CM | POA: Diagnosis not present

## 2024-06-01 DIAGNOSIS — J4489 Other specified chronic obstructive pulmonary disease: Secondary | ICD-10-CM | POA: Diagnosis not present

## 2024-06-01 DIAGNOSIS — Z90710 Acquired absence of both cervix and uterus: Secondary | ICD-10-CM | POA: Diagnosis not present

## 2024-06-01 DIAGNOSIS — Z7952 Long term (current) use of systemic steroids: Secondary | ICD-10-CM | POA: Diagnosis not present

## 2024-06-01 DIAGNOSIS — M810 Age-related osteoporosis without current pathological fracture: Secondary | ICD-10-CM | POA: Diagnosis not present

## 2024-06-01 DIAGNOSIS — I719 Aortic aneurysm of unspecified site, without rupture: Secondary | ICD-10-CM | POA: Diagnosis not present

## 2024-06-01 DIAGNOSIS — F17210 Nicotine dependence, cigarettes, uncomplicated: Secondary | ICD-10-CM | POA: Diagnosis not present

## 2024-06-01 DIAGNOSIS — G8929 Other chronic pain: Secondary | ICD-10-CM | POA: Diagnosis not present

## 2024-06-01 DIAGNOSIS — F419 Anxiety disorder, unspecified: Secondary | ICD-10-CM | POA: Diagnosis not present

## 2024-06-01 DIAGNOSIS — I2510 Atherosclerotic heart disease of native coronary artery without angina pectoris: Secondary | ICD-10-CM | POA: Diagnosis not present

## 2024-06-01 DIAGNOSIS — F32A Depression, unspecified: Secondary | ICD-10-CM | POA: Diagnosis not present

## 2024-06-01 DIAGNOSIS — Z7902 Long term (current) use of antithrombotics/antiplatelets: Secondary | ICD-10-CM | POA: Diagnosis not present

## 2024-06-01 DIAGNOSIS — M40209 Unspecified kyphosis, site unspecified: Secondary | ICD-10-CM | POA: Diagnosis not present

## 2024-06-02 ENCOUNTER — Encounter

## 2024-06-03 DIAGNOSIS — I719 Aortic aneurysm of unspecified site, without rupture: Secondary | ICD-10-CM | POA: Diagnosis not present

## 2024-06-03 DIAGNOSIS — M810 Age-related osteoporosis without current pathological fracture: Secondary | ICD-10-CM | POA: Diagnosis not present

## 2024-06-03 DIAGNOSIS — J4489 Other specified chronic obstructive pulmonary disease: Secondary | ICD-10-CM | POA: Diagnosis not present

## 2024-06-03 DIAGNOSIS — I2510 Atherosclerotic heart disease of native coronary artery without angina pectoris: Secondary | ICD-10-CM | POA: Diagnosis not present

## 2024-06-03 DIAGNOSIS — M40209 Unspecified kyphosis, site unspecified: Secondary | ICD-10-CM | POA: Diagnosis not present

## 2024-06-03 DIAGNOSIS — J439 Emphysema, unspecified: Secondary | ICD-10-CM | POA: Diagnosis not present

## 2024-06-04 ENCOUNTER — Encounter

## 2024-06-09 ENCOUNTER — Encounter

## 2024-06-09 DIAGNOSIS — M810 Age-related osteoporosis without current pathological fracture: Secondary | ICD-10-CM | POA: Diagnosis not present

## 2024-06-09 DIAGNOSIS — J4489 Other specified chronic obstructive pulmonary disease: Secondary | ICD-10-CM | POA: Diagnosis not present

## 2024-06-09 DIAGNOSIS — J439 Emphysema, unspecified: Secondary | ICD-10-CM | POA: Diagnosis not present

## 2024-06-09 DIAGNOSIS — I2510 Atherosclerotic heart disease of native coronary artery without angina pectoris: Secondary | ICD-10-CM | POA: Diagnosis not present

## 2024-06-09 DIAGNOSIS — I719 Aortic aneurysm of unspecified site, without rupture: Secondary | ICD-10-CM | POA: Diagnosis not present

## 2024-06-09 DIAGNOSIS — M40209 Unspecified kyphosis, site unspecified: Secondary | ICD-10-CM | POA: Diagnosis not present

## 2024-06-17 DIAGNOSIS — M810 Age-related osteoporosis without current pathological fracture: Secondary | ICD-10-CM | POA: Diagnosis not present

## 2024-06-17 DIAGNOSIS — Z7952 Long term (current) use of systemic steroids: Secondary | ICD-10-CM | POA: Diagnosis not present

## 2024-06-17 DIAGNOSIS — I719 Aortic aneurysm of unspecified site, without rupture: Secondary | ICD-10-CM | POA: Diagnosis not present

## 2024-06-17 DIAGNOSIS — J439 Emphysema, unspecified: Secondary | ICD-10-CM | POA: Diagnosis not present

## 2024-06-17 DIAGNOSIS — R0609 Other forms of dyspnea: Secondary | ICD-10-CM | POA: Diagnosis not present

## 2024-06-17 DIAGNOSIS — M40209 Unspecified kyphosis, site unspecified: Secondary | ICD-10-CM | POA: Diagnosis not present

## 2024-06-17 DIAGNOSIS — J4489 Other specified chronic obstructive pulmonary disease: Secondary | ICD-10-CM | POA: Diagnosis not present

## 2024-06-17 DIAGNOSIS — J449 Chronic obstructive pulmonary disease, unspecified: Secondary | ICD-10-CM | POA: Diagnosis not present

## 2024-06-17 DIAGNOSIS — I2510 Atherosclerotic heart disease of native coronary artery without angina pectoris: Secondary | ICD-10-CM | POA: Diagnosis not present

## 2024-06-17 DIAGNOSIS — R0902 Hypoxemia: Secondary | ICD-10-CM | POA: Diagnosis not present

## 2024-06-17 DIAGNOSIS — Z9981 Dependence on supplemental oxygen: Secondary | ICD-10-CM | POA: Diagnosis not present

## 2024-06-18 ENCOUNTER — Encounter: Payer: Self-pay | Admitting: Nurse Practitioner

## 2024-06-18 ENCOUNTER — Ambulatory Visit (INDEPENDENT_AMBULATORY_CARE_PROVIDER_SITE_OTHER): Admitting: Nurse Practitioner

## 2024-06-18 VITALS — BP 118/70 | HR 78 | Temp 96.1°F | Resp 16 | Ht <= 58 in | Wt 73.8 lb

## 2024-06-18 DIAGNOSIS — R5381 Other malaise: Secondary | ICD-10-CM

## 2024-06-18 DIAGNOSIS — J9611 Chronic respiratory failure with hypoxia: Secondary | ICD-10-CM | POA: Diagnosis not present

## 2024-06-18 DIAGNOSIS — F411 Generalized anxiety disorder: Secondary | ICD-10-CM | POA: Diagnosis not present

## 2024-06-18 DIAGNOSIS — J449 Chronic obstructive pulmonary disease, unspecified: Secondary | ICD-10-CM

## 2024-06-18 MED ORDER — LORAZEPAM 0.5 MG PO TABS: ORAL_TABLET | ORAL | 2 refills | Status: AC

## 2024-06-18 NOTE — Progress Notes (Signed)
 [facility] [address] [place], [place] [address]  Internal MEDICINE  Office Visit Note  Patient Name: Christina Hester  [id number]  [id number]  Date of Service: 06/18/2024  Chief Complaint  Patient presents with   Hyperlipidemia   Hypertension   Follow-up    HPI [name] presents for a follow-up visit for advanced COPD, anxiety, DNR and palliative care. Advanced COPD -- sees Dr. Parris and his APP for management. She has also been seeing an RT lately. She is now supposed to be on her supplemental oxygen  24/7. She is not wearing it now because it is in her car and is heavy. She plans to get a small rolling basket to place it in so she can use her oxygen  anywhere she goes.  High anxiety -- related to increasing SOB, she continued to take lorazepam  as needed. Due for refills.  She is a DNR and has been referred to palliative care for hospice evaluation. She does not think she will qualify right now. She is supposed to have a phone call or visit this week to be evaluated.     Current Medication: Outpatient Encounter Medications as of 06/18/2024  Medication Sig   amLODipine  (NORVASC ) 5 MG tablet Take 1 tablet (5 mg total) by mouth 2 (two) times daily.   arformoterol  (BROVANA ) 15 MCG/2ML NEBU Inhale 15 mcg into the lungs.   azithromycin  (ZITHROMAX ) 250 MG tablet Take 1 tablet by mouth every Monday, Wednesday, and Friday.   bisoprolol  (ZEBETA ) 5 MG tablet Take 1 tablet (5 mg total) by mouth daily.   buPROPion  (WELLBUTRIN ) 100 MG tablet Take 1 tablet (100 mg total) by mouth daily.   clopidogrel  (PLAVIX ) 75 MG tablet Take 1 tablet (75 mg total) by mouth daily.   clotrimazole  (MYCELEX ) 10 MG troche Take 1 tablet (10 mg total) by mouth daily as needed (thrush).   Ensifentrine  (OHTUVAYRE ) 3 MG/2.5ML SUSP Inhale 3 mg into the lungs in the morning and at bedtime.   ipratropium-albuterol  (DUONEB) 0.5-2.5 (3) MG/3ML SOLN USE 1 VIAL IN NEBULIZER EVERY 4 HOURS   LORazepam  (ATIVAN )  0.5 MG tablet TAKE 1/2 TO 1 TABLET BY MOUTH EVERY 8 HOURS AS NEEDED FOR ANXIETY OR SLEEP   meloxicam  (MOBIC ) 15 MG tablet Take 1 tablet (15 mg total) by mouth daily. (Patient taking differently: Take 7.5 mg by mouth daily as needed.)   nitroGLYCERIN  (NITROSTAT ) 0.4 MG SL tablet Place 1 tablet (0.4 mg total) under the tongue every 5 (five) minutes as needed for chest pain.   nystatin  (MYCOSTATIN ) 100000 UNIT/ML suspension Take 5 mLs (500,000 Units total) by mouth 4 (four) times daily. Until thrush is resolved. May keep as needed for recurrences.   ondansetron  (ZOFRAN ) 4 MG tablet Take 1 tablet (4 mg total) by mouth every 8 (eight) hours as needed for nausea or vomiting.   pantoprazole  (PROTONIX ) 40 MG tablet Take 1 tablet (40 mg total) by mouth daily.   predniSONE  (DELTASONE ) 1 MG tablet Take 3 mg by mouth daily with breakfast.   rOPINIRole  (REQUIP ) 0.25 MG tablet Take 1 tablet (0.25 mg total) by mouth at bedtime.   Spacer/Aero-Holding Chambers (EASIVENT) inhaler See admin instructions.   VENTOLIN  HFA 108 (90 Base) MCG/ACT inhaler INHALE 2 PUFFS INTO THE LUNGS EVERY 6 HOURS AS NEEDED FOR WHEEZING OR SHORTNESS OF BREATH.   [DISCONTINUED] LORazepam  (ATIVAN ) 0.5 MG tablet TAKE 1/2 TO 1 TABLET BY MOUTH EVERY 8 HOURS AS NEEDED FOR ANXIETY OR SLEEP   No facility-administered encounter medications on  file as of 06/18/2024.    Surgical History: Past Surgical History:  Procedure Laterality Date   ABDOMINAL HYSTERECTOMY     APPENDECTOMY     BREAST EXCISIONAL BIOPSY Left 80's or 90's   NEG   CARDIAC CATHETERIZATION     COLONOSCOPY N/A 05/22/2018   Procedure: COLONOSCOPY;  Surgeon: Unk Corinn Skiff, MD;  Location: [facility] ENDOSCOPY;  Service: Gastroenterology;  Laterality: N/A;   COLONOSCOPY WITH PROPOFOL  N/A 07/19/2020   Procedure: COLONOSCOPY WITH PROPOFOL ;  Surgeon: Toledo, Ladell POUR, MD;  Location: [facility] ENDOSCOPY;  Service: Gastroenterology;  Laterality: N/A;   ENDARTERECTOMY Right 11/06/2019    Procedure: ENDARTERECTOMY CAROTID;  Surgeon: Marea Selinda RAMAN, MD;  Location: [facility] ORS;  Service: Vascular;  Laterality: Right;   LEFT HEART CATH Bilateral 03/05/2017   Procedure: Left Heart Cath with poss PCI;  Surgeon: Darron Deatrice LABOR, MD;  Location: [facility] INVASIVE CV LAB;  Service: Cardiovascular;  Laterality: Bilateral;    Medical History: Past Medical History:  Diagnosis Date   AAA (abdominal aortic aneurysm)    a. 05/2016 Abd U/S: 3.02 x 3.02 AAA.   Anxiety    CAD (coronary artery disease)    a. 02/2017 NSTEMI/Cath: LM nl, LAD mild dzs, D2 min irregs, LCX mild dzs, OM2 50ost, RCA 95p, 119m, L-->R collats, EF 55-65%.   COPD (chronic obstructive pulmonary disease) (HCC)    Diastolic dysfunction    a. 02/2017 Echo: EF 55-60%, gr1 DD, ? inf HK, mild MR.   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 05/2016 ABI's: R 1.22, L 1.11.   Tobacco abuse     Family History: Family History  Problem Relation Age of Onset   Breast cancer Mother    Dementia Mother    Hypertension Mother    Breast cancer Maternal Aunt    Breast cancer Maternal Aunt    Breast cancer Maternal Aunt    CVA Father     Social History   Socioeconomic History   Marital status: Divorced    Spouse name: Not on file   Number of children: Not on file   Years of education: Not on file   Highest education level: Not on file  Occupational History   Not on file  Tobacco Use   Smoking status: Former    Current packs/day: 0.25    Average packs/day: 0.9 packs/day for 52.8 years (46.9 ttl pk-yrs)    Types: Cigarettes    Start date: 09/13/1971    Passive exposure: Current   Smokeless tobacco: Never  Vaping Use   Vaping status: Never Used  Substance and Sexual Activity   Alcohol use: No   Drug use: No   Sexual activity: Not Currently  Other Topics Concern   Not on file  Social  History Narrative   Not on file   Social Drivers of Health   Financial Resource Strain: Low Risk  (03/05/2024)   Received from [facility]   Overall Financial Resource Strain (CARDIA)    Difficulty of Paying Living Expenses: Not hard at all  Food Insecurity: No Food Insecurity (03/05/2024)   Received from [facility]   Hunger Vital Sign    Within the past 12 months, you worried that your food would run out before you got the money to buy more.:  Never true    Within the past 12 months, the food you bought just didn't last and you didn't have money to get more.: Never true  Transportation Needs: No Transportation Needs (03/05/2024)   Received from [facility] - Transportation    In the past 12 months, has lack of transportation kept you from medical appointments or from getting medications?: No    Lack of Transportation (Non-Medical): No  Physical Activity: Not on file  Stress: Not on file  Social Connections: Not on file  Intimate Partner Violence: Not At Risk (11/26/2022)   Humiliation, Afraid, Rape, and Kick questionnaire    Fear of Current or Ex-Partner: No    Emotionally Abused: No    Physically Abused: No    Sexually Abused: No      Review of Systems  Constitutional:  Positive for activity change, appetite change and fatigue.  HENT: Negative.    Respiratory:  Positive for cough, chest tightness (intermittent), shortness of breath (at baseline) and wheezing (intermittent).        Persistent symptoms due to advanced COPD  Cardiovascular: Negative.  Negative for chest pain and palpitations.  Gastrointestinal: Negative.  Negative for abdominal pain.  Musculoskeletal:  Positive for arthralgias, back pain, gait problem and myalgias.  Neurological:  Positive for weakness. Negative for headaches.  Hematological:  Negative for adenopathy. Does not bruise/bleed easily.  Psychiatric/Behavioral:  Positive for behavioral  problems and sleep disturbance. Negative for self-injury and suicidal ideas. The patient is nervous/anxious.     Vital Signs: BP 118/70   Pulse 78   Temp (!) 96.1 F (35.6 C)   Resp 16   Ht 3' 11 (1.194 m)   Wt 73 lb 12.8 oz (33.5 kg)   SpO2 93%   BMI 23.49 kg/m    Physical Exam Vitals reviewed.  Constitutional:      General: She is not in acute distress.    Appearance: Normal appearance. She is not ill-appearing.  HENT:     Head: Normocephalic and atraumatic.  Eyes:     Pupils: Pupils are equal, round, and reactive to light.  Cardiovascular:     Rate and Rhythm: Normal rate and regular rhythm.  Pulmonary:     Effort: Pulmonary effort is normal. No accessory muscle usage or respiratory distress.     Breath sounds: Decreased air movement present. Examination of the right-upper field reveals wheezing. Examination of the left-upper field reveals wheezing. Examination of the right-middle field reveals decreased breath sounds. Examination of the left-middle field reveals decreased breath sounds. Examination of the right-lower field reveals decreased breath sounds. Examination of the left-lower field reveals decreased breath sounds. Decreased breath sounds and wheezing present.  Neurological:     Mental Status: She is alert and oriented to person, place, and time.  Psychiatric:        Mood and Affect: Mood normal.        Behavior: Behavior normal.        Assessment/Plan: 1. Advanced COPD (HCC) (Primary) Continue to take medications as prescribed, follow up with Dr. Braxton office as instructed and use supplemental oxygen  as instructed  2. Chronic respiratory failure with hypoxia (HCC) Continue to take medications as prescribed, follow up with Dr. Braxton office as instructed and use supplemental oxygen  as instructed  3. Declining functional status Has initial consult with palliative care this week. Will evaluate for eligibility for hospice care.   4. Generalized  anxiety disorder Continue prn lorazepam  as prescribed. Follow  up in 3 months for additional refills if not yet on hospice at that time.  - LORazepam  (ATIVAN ) 0.5 MG tablet; TAKE 1/2 TO 1 TABLET BY MOUTH EVERY 8 HOURS AS NEEDED FOR ANXIETY OR SLEEP  Dispense: 70 tablet; Refill: 2   General Counseling: [place] verbalizes understanding of the findings of todays visit and agrees with plan of treatment. I have discussed any further diagnostic evaluation that may be needed or ordered today. We also reviewed her medications today. she has been encouraged to call the office with any questions or concerns that should arise related to todays visit.    No orders of the defined types were placed in this encounter.   Meds ordered this encounter  Medications   LORazepam  (ATIVAN ) 0.5 MG tablet    Sig: TAKE 1/2 TO 1 TABLET BY MOUTH EVERY 8 HOURS AS NEEDED FOR ANXIETY OR SLEEP    Dispense:  70 tablet    Refill:  2    Please keep on file for future refills    Return in about 12 weeks (around 09/10/2024) for F/U, anxiety med refill, Taija Mathias PCP.   Total time spent:30 Minutes Time spent includes review of chart, medications, test results, and follow up plan with the patient.   [place] Controlled Substance Database was reviewed by me.  This patient was seen by Mardy Maxin, FNP-C in collaboration with Dr. Sigrid Bathe as a part of collaborative care agreement.   Aizley Stenseth R. Maxin, MSN, FNP-C Internal medicine

## 2024-06-23 ENCOUNTER — Telehealth: Payer: Self-pay | Admitting: Nurse Practitioner

## 2024-06-23 DIAGNOSIS — I719 Aortic aneurysm of unspecified site, without rupture: Secondary | ICD-10-CM | POA: Diagnosis not present

## 2024-06-23 DIAGNOSIS — J4489 Other specified chronic obstructive pulmonary disease: Secondary | ICD-10-CM | POA: Diagnosis not present

## 2024-06-23 DIAGNOSIS — M40209 Unspecified kyphosis, site unspecified: Secondary | ICD-10-CM | POA: Diagnosis not present

## 2024-06-23 DIAGNOSIS — J439 Emphysema, unspecified: Secondary | ICD-10-CM | POA: Diagnosis not present

## 2024-06-23 DIAGNOSIS — I2510 Atherosclerotic heart disease of native coronary artery without angina pectoris: Secondary | ICD-10-CM | POA: Diagnosis not present

## 2024-06-23 DIAGNOSIS — M810 Age-related osteoporosis without current pathological fracture: Secondary | ICD-10-CM | POA: Diagnosis not present

## 2024-06-23 NOTE — Telephone Encounter (Signed)
 Received O2 recert order from [name]. Gave to [name]-Toni

## 2024-06-25 DIAGNOSIS — M5414 Radiculopathy, thoracic region: Secondary | ICD-10-CM | POA: Diagnosis not present

## 2024-06-25 DIAGNOSIS — M5416 Radiculopathy, lumbar region: Secondary | ICD-10-CM | POA: Diagnosis not present

## 2024-06-25 DIAGNOSIS — M48062 Spinal stenosis, lumbar region with neurogenic claudication: Secondary | ICD-10-CM | POA: Diagnosis not present

## 2024-06-30 ENCOUNTER — Other Ambulatory Visit: Payer: Self-pay | Admitting: Nurse Practitioner

## 2024-06-30 DIAGNOSIS — F411 Generalized anxiety disorder: Secondary | ICD-10-CM

## 2024-07-07 ENCOUNTER — Encounter: Payer: Self-pay | Admitting: Cardiology

## 2024-07-07 ENCOUNTER — Ambulatory Visit: Attending: Cardiology | Admitting: Cardiology

## 2024-07-07 VITALS — BP 120/58 | HR 76 | Ht 59.0 in | Wt 71.9 lb

## 2024-07-07 DIAGNOSIS — I6523 Occlusion and stenosis of bilateral carotid arteries: Secondary | ICD-10-CM | POA: Diagnosis present

## 2024-07-07 DIAGNOSIS — Z720 Tobacco use: Secondary | ICD-10-CM | POA: Insufficient documentation

## 2024-07-07 DIAGNOSIS — I2510 Atherosclerotic heart disease of native coronary artery without angina pectoris: Secondary | ICD-10-CM | POA: Diagnosis present

## 2024-07-07 DIAGNOSIS — I714 Abdominal aortic aneurysm, without rupture, unspecified: Secondary | ICD-10-CM | POA: Diagnosis not present

## 2024-07-07 DIAGNOSIS — J449 Chronic obstructive pulmonary disease, unspecified: Secondary | ICD-10-CM | POA: Insufficient documentation

## 2024-07-07 DIAGNOSIS — J4489 Other specified chronic obstructive pulmonary disease: Secondary | ICD-10-CM | POA: Diagnosis not present

## 2024-07-07 DIAGNOSIS — I739 Peripheral vascular disease, unspecified: Secondary | ICD-10-CM | POA: Diagnosis not present

## 2024-07-07 DIAGNOSIS — J439 Emphysema, unspecified: Secondary | ICD-10-CM | POA: Insufficient documentation

## 2024-07-07 DIAGNOSIS — I10 Essential (primary) hypertension: Secondary | ICD-10-CM | POA: Diagnosis present

## 2024-07-07 DIAGNOSIS — E782 Mixed hyperlipidemia: Secondary | ICD-10-CM | POA: Insufficient documentation

## 2024-07-07 MED ORDER — NITROGLYCERIN 0.4 MG SL SUBL: 0.4000 mg | SUBLINGUAL_TABLET | SUBLINGUAL | 3 refills | Status: AC | PRN

## 2024-07-07 NOTE — Progress Notes (Signed)
 " Cardiology Office Note   Date:  07/07/2024  ID:  Christina Hester, DOB 1941-07-08, MRN [id number] PCP: Liana Fish, NP  [facility] Providers Cardiologist:  Deatrice Cage, MD     History of Present Illness Christina Hester is a 83 y.o. female with a past medical history of coronary artery disease, COPD, prolonged tobacco use, moderate bilateral carotid disease, abdominal aortic aneurysm followed by [facility], hypertension, chronic HFpEF, peripheral vascular disease, hyperlipidemia, who is seen today for follow-up.   Previous NSTEMI in 02/2017 with left main normal, LAD with mild disease, D2 with moderate irregularities, left circumflex with mild disease, OM 250% ostial stenosis, RCA 95% proximal stenosis 100% mid stenosis with left-to-right collaterals. Recommended medical therapy.  Echocardiogram completed in 02/2017 revealed an LVEF of 55 to 60%, possible mild hypokinesis of the inferior myocardium, G1 DD, mild mitral regurgitation.  She underwent Lexiscan  Myoview  in 11/2021 which was considered a low risk study.  Event monitor in 11/2021 showed predominant underlying rhythm was sinus with an average heart rate of 72 bpm with 14 supraventricular tachycardia runs and rare PACs and PVCs.  She was seen in clinic 01/05/2023 by Dr. Cage.  At that time she had recent multiple hospitalizations for COPD exacerbation and findings of ectopy months ago.  She is on chronic oxygen  at home.  She was continued on her current medication regimen without any changes needed at that time.  She was seen in clinic 08/07/2023 stating she been doing well from a cardiac perspective.  She continues to follow with pulmonary and recently had started on medication that she stated made all the difference overall.  She was continued on chronic oxygen  therapy and 2 L of O2 via nasal cannula.  There were no medication changes made and further testing that was ordered at the time.  She was seen in clinic 02/26/2024 accompanied  by her daughter.  For the cardiac perspective she was doing well.  She continue to follow with pulmonary recently been sent to pulmonary rehab.  At that time they noted a drop in her blood pressure running 130-160 about 130.  She was unable to exercise at rehab due to elevated blood pressures.  She was restarted on losartan  12.5 mg daily with repeat BMP in 2 to 3 weeks to reevaluate kidney function.  No further testing was ordered at that time.  She was previously evaluated in clinic 03/25/2024 doing well with cardiac perspective. Losartan  previously been discontinued due to drops in her blood pressure.  Blood pressure at time of her appointment was 100/60.  She was last seen in clinic 05/06/2024 accompanied by her daughter.  Continued complaints of shortness of breath that she stated was unchanged.  She had followed up with pulmonary and had questions today about getting placed on a BiPAP machine at home.   She returns to clinic today accompanied by her daughter.  She continues to have chronic shortness of breath that is unchanged.  She has been placed on oxygen  24/7 although she is on room air today.  Continues to have some occasional peripheral edema to the left lower extremity but has compression stockings on today.  States that she has been compliant with her current medication regimen without any undue side effects.  Denies any recent hospitalizations or visits to the emergency department states that she has continued to follow-up with pulmonary.  ROS: 10 point review of system has been reviewed and considered negative exception was been listed in the HPI  Studies  Reviewed      Event Monitor ([employer]) 12/13/2021 Patient had a min HR of 55 bpm, max HR of 179 bpm, and avg HR of 72 bpm. Predominant underlying rhythm was Sinus Rhythm.  14 Supraventricular Tachycardia runs occurred, the run with the fastest interval lasting 5 beats with a max rate of 179 bpm, the longest lasting 14 beats with an avg rate of  100 bpm. Rare PACs and rare PVCs.   Lexiscan  Myoview  11/15/2021   Normal pharmacologic myocardial perfusion stress test without evidence of significant ischemia or scar.   Left ventricular systolic function is normal.   Coronary artery and aortic valvular calcifications are noted, as well as aortic atherosclerosis.   Emphysematous changes are noted in the visualized lungs on the attenuation correction CT.   This is a low risk study.   2D echo 03/05/2017 Study Conclusions  - Left ventricle: The cavity size was normal. There was mild    concentric hypertrophy. Systolic function was normal. The    estimated ejection fraction was in the range of 55% to 60%.    Possible mild hypokinesis of the inferior myocardium. Doppler    parameters are consistent with abnormal left ventricular    relaxation (grade 1 diastolic dysfunction).  - Mitral valve: There was mild regurgitation.    [facility] 03/05/2017 The left ventricular systolic function is normal. LV end diastolic pressure is mildly elevated. The left ventricular ejection fraction is 55-65% by visual estimate. Prox RCA lesion, 95 %stenosed. Mid RCA lesion, 100 %stenosed. Ost 2nd Mrg to 2nd Mrg lesion, 50 %stenosed.   1. Severe one-vessel coronary artery disease with chronically occluded right coronary artery with well-developed left-to-right collaterals. Mild to moderate nonobstructive disease affecting the left system with extremely tortuous coronary arteries. 2. Normal LV systolic function and mildly elevated left ventricular end-diastolic pressure.   Recommendations: Medical therapy.  Risk Assessment/Calculations           Physical Exam VS:  BP (!) 120/58 (BP Location: Left Arm, Patient Position: Sitting, Cuff Size: Normal)   Pulse 76   Ht 4' 11 (1.499 m)   Wt 71 lb 14.4 oz (32.6 kg)   SpO2 94%   BMI 14.52 kg/m        Wt Readings from Last 3 Encounters:  07/07/24 71 lb 14.4 oz (32.6 kg)  06/18/24 73 lb 12.8 oz (33.5 kg)   05/06/24 74 lb (33.6 kg)    GEN: Well nourished, well developed in no acute distress NECK: No JVD; No carotid bruits CARDIAC: RRR, no murmurs, rubs, gallops RESPIRATORY: Diminished to auscultation without rales, wheezing or rhonchi, respirations are unlabored at rest on room air ABDOMEN: Soft, non-tender, non-distended EXTREMITIES: Trace pretibial edema; No deformity   ASSESSMENT AND PLAN Coronary artery disease of native coronary arteries without angina.  She is continued on clopidogrel  75 mg daily indefinitely given her allergy to aspirin .  She is continued on small dose of amlodipine  for antianginal effect.  With continued denial of chest pain or signs of decompensation no further ischemic evaluation is needed at this time.  Hypertension with a blood pressure today of 120/58.  Blood pressures remain stable.  She has been advised to continue with her current medication of amlodipine  and bisoprolol .  She has been encouraged to continue to monitor her pressures 1 to 2 hours postmedication administration.  Mixed hyperlipidemia with last LDL of 159 with a goal below 70.  She continues to refuse statin medications.  Peripheral arterial disease with AAA followed by [facility].  AAA was slightly increased in size now measuring 2.8 cm in diameter which she continues to follow annually until reaches 4 cm according to vascular given her extremely small size that we will consider fixing it at 4.5 versus 5.  She is to continue with annual surveillance studies.  She also has an abnormal ABIs with 0.7 on the right and 0.82 on the left continues to have multiphasic waveforms.  She does have palpable pulses without rest pain or ulcerations to her bilateral lower extremities.  She has updated scans already ordered for February 2026.  Ongoing management per [facility].  COPD requiring oxygen  therapy with ongoing tobacco use. COPD is not currently in exacerbation.  She continues to smoke 1 to 2 cigarettes/day with total  cessation continue to be recommended.  Ongoing management per pulmonary for COPD and oxygen  therapy.       Dispo: Patient to return to clinic to see MD/APP in 4 months or sooner if needed for further evaluation  Signed, Lovie Zarling, NP   "

## 2024-07-07 NOTE — Patient Instructions (Signed)
 Medication Instructions:  Your physician recommends that you continue on your current medications as directed. Please refer to the Current Medication list given to you today.   *If you need a refill on your cardiac medications before your next appointment, please call your pharmacy*  Lab Work: No labs ordered today  If you have labs (blood work) drawn today and your tests are completely normal, you will receive your results only by: MyChart Message (if you have MyChart) OR A paper copy in the mail If you have any lab test that is abnormal or we need to change your treatment, we will call you to review the results.  Testing/Procedures: No test ordered today   Follow-Up: At [facility], you and your health needs are our priority.  As part of our continuing mission to provide you with exceptional heart care, our providers are all part of one team.  This team includes your primary Cardiologist (physician) and Advanced Practice Providers or APPs (Physician Assistants and Nurse Practitioners) who all work together to provide you with the care you need, when you need it.  Your next appointment:   4 month(s)  Provider:   You may see Deatrice Cage, MD or one of the following Advanced Practice Providers on your designated Care Team:   Tylene Lunch, NP

## 2024-07-14 ENCOUNTER — Other Ambulatory Visit: Payer: Self-pay | Admitting: Nurse Practitioner

## 2024-07-14 DIAGNOSIS — J449 Chronic obstructive pulmonary disease, unspecified: Secondary | ICD-10-CM

## 2024-07-16 ENCOUNTER — Other Ambulatory Visit: Payer: Self-pay | Admitting: Nurse Practitioner

## 2024-07-16 DIAGNOSIS — J449 Chronic obstructive pulmonary disease, unspecified: Secondary | ICD-10-CM

## 2024-07-24 ENCOUNTER — Telehealth: Payer: Self-pay | Admitting: Nurse Practitioner

## 2024-07-24 NOTE — Telephone Encounter (Signed)
 Received 06/23/24 O2 recert order from [name]. Gave to [name]-Toni

## 2024-07-28 ENCOUNTER — Telehealth: Payer: Self-pay | Admitting: Nurse Practitioner

## 2024-07-28 NOTE — Telephone Encounter (Signed)
 06/23/24 O2 recert order signed & faxed back to  [name]; [phone]. Scanned-Toni

## 2024-09-08 ENCOUNTER — Ambulatory Visit: Admitting: Nurse Practitioner

## 2024-09-09 ENCOUNTER — Other Ambulatory Visit (INDEPENDENT_AMBULATORY_CARE_PROVIDER_SITE_OTHER): Payer: Medicare Other

## 2024-09-09 ENCOUNTER — Ambulatory Visit (INDEPENDENT_AMBULATORY_CARE_PROVIDER_SITE_OTHER): Payer: Medicare Other | Admitting: Vascular Surgery

## 2024-09-09 ENCOUNTER — Encounter (INDEPENDENT_AMBULATORY_CARE_PROVIDER_SITE_OTHER): Payer: Medicare Other

## 2024-11-11 ENCOUNTER — Ambulatory Visit: Admitting: Cardiology

## 2025-03-26 ENCOUNTER — Ambulatory Visit: Admitting: Nurse Practitioner
